# Patient Record
Sex: Female | Born: 1955 | Race: White | Hispanic: No | Smoking: Former smoker
Health system: Southern US, Community
[De-identification: ages and names within clinical notes are randomized; demographics above are authoritative.]

## PROBLEM LIST (undated history)

## (undated) ENCOUNTER — Emergency Department (HOSPITAL_BASED_OUTPATIENT_CLINIC_OR_DEPARTMENT_OTHER): Admission: EM | Payer: 59 | Source: Home / Self Care

## (undated) DIAGNOSIS — J449 Chronic obstructive pulmonary disease, unspecified: Secondary | ICD-10-CM

## (undated) DIAGNOSIS — K219 Gastro-esophageal reflux disease without esophagitis: Secondary | ICD-10-CM

## (undated) DIAGNOSIS — I5181 Takotsubo syndrome: Secondary | ICD-10-CM

## (undated) DIAGNOSIS — I214 Non-ST elevation (NSTEMI) myocardial infarction: Secondary | ICD-10-CM

## (undated) DIAGNOSIS — J189 Pneumonia, unspecified organism: Secondary | ICD-10-CM

## (undated) DIAGNOSIS — F419 Anxiety disorder, unspecified: Secondary | ICD-10-CM

## (undated) HISTORY — DX: Takotsubo syndrome: I51.81

## (undated) HISTORY — PX: CARDIAC CATHETERIZATION: SHX172

## (undated) HISTORY — PX: ABDOMINAL HYSTERECTOMY: SHX81

## (undated) HISTORY — PX: OTHER SURGICAL HISTORY: SHX169

---

## 1998-09-01 ENCOUNTER — Emergency Department (HOSPITAL_COMMUNITY): Admission: EM | Admit: 1998-09-01 | Discharge: 1998-09-01 | Payer: Self-pay

## 1998-09-01 ENCOUNTER — Encounter: Payer: Self-pay | Admitting: Emergency Medicine

## 2001-05-29 ENCOUNTER — Other Ambulatory Visit: Admission: RE | Admit: 2001-05-29 | Discharge: 2001-05-29 | Payer: Self-pay | Admitting: Obstetrics and Gynecology

## 2002-06-16 ENCOUNTER — Emergency Department (HOSPITAL_COMMUNITY): Admission: EM | Admit: 2002-06-16 | Discharge: 2002-06-16 | Payer: Self-pay | Admitting: Emergency Medicine

## 2003-04-16 ENCOUNTER — Emergency Department (HOSPITAL_COMMUNITY): Admission: EM | Admit: 2003-04-16 | Discharge: 2003-04-16 | Payer: Self-pay

## 2004-07-27 ENCOUNTER — Other Ambulatory Visit: Admission: RE | Admit: 2004-07-27 | Discharge: 2004-07-27 | Payer: Self-pay | Admitting: Family Medicine

## 2005-05-26 ENCOUNTER — Ambulatory Visit: Payer: Self-pay | Admitting: Internal Medicine

## 2005-06-01 ENCOUNTER — Ambulatory Visit: Payer: Self-pay | Admitting: *Deleted

## 2005-06-13 ENCOUNTER — Ambulatory Visit: Payer: Self-pay | Admitting: Internal Medicine

## 2005-06-28 ENCOUNTER — Ambulatory Visit: Payer: Self-pay | Admitting: Internal Medicine

## 2005-07-25 ENCOUNTER — Ambulatory Visit: Payer: Self-pay | Admitting: Family Medicine

## 2005-07-31 ENCOUNTER — Ambulatory Visit: Payer: Self-pay | Admitting: Internal Medicine

## 2005-10-02 ENCOUNTER — Ambulatory Visit: Payer: Self-pay | Admitting: Internal Medicine

## 2005-10-11 ENCOUNTER — Ambulatory Visit: Payer: Self-pay | Admitting: Internal Medicine

## 2006-01-15 ENCOUNTER — Ambulatory Visit: Payer: Self-pay | Admitting: Internal Medicine

## 2006-01-16 ENCOUNTER — Encounter (INDEPENDENT_AMBULATORY_CARE_PROVIDER_SITE_OTHER): Payer: Self-pay | Admitting: Family Medicine

## 2006-01-23 ENCOUNTER — Ambulatory Visit (HOSPITAL_COMMUNITY): Admission: RE | Admit: 2006-01-23 | Discharge: 2006-01-23 | Payer: Self-pay | Admitting: Internal Medicine

## 2006-02-05 ENCOUNTER — Ambulatory Visit: Payer: Self-pay | Admitting: Internal Medicine

## 2006-07-19 ENCOUNTER — Ambulatory Visit: Payer: Self-pay | Admitting: Family Medicine

## 2006-07-24 ENCOUNTER — Ambulatory Visit (HOSPITAL_COMMUNITY): Admission: RE | Admit: 2006-07-24 | Discharge: 2006-07-24 | Payer: Self-pay | Admitting: Family Medicine

## 2006-08-10 ENCOUNTER — Ambulatory Visit: Payer: Self-pay | Admitting: Internal Medicine

## 2006-08-12 ENCOUNTER — Inpatient Hospital Stay (HOSPITAL_COMMUNITY): Admission: EM | Admit: 2006-08-12 | Discharge: 2006-08-14 | Payer: Self-pay | Admitting: Emergency Medicine

## 2006-08-21 ENCOUNTER — Ambulatory Visit: Payer: Self-pay | Admitting: Internal Medicine

## 2006-09-07 ENCOUNTER — Ambulatory Visit: Payer: Self-pay | Admitting: Internal Medicine

## 2007-01-24 ENCOUNTER — Ambulatory Visit: Payer: Self-pay | Admitting: Internal Medicine

## 2007-03-12 ENCOUNTER — Emergency Department (HOSPITAL_COMMUNITY): Admission: EM | Admit: 2007-03-12 | Discharge: 2007-03-12 | Payer: Self-pay | Admitting: Emergency Medicine

## 2007-04-09 ENCOUNTER — Ambulatory Visit: Payer: Self-pay | Admitting: Family Medicine

## 2007-05-06 ENCOUNTER — Ambulatory Visit: Payer: Self-pay | Admitting: Family Medicine

## 2007-05-20 ENCOUNTER — Encounter (INDEPENDENT_AMBULATORY_CARE_PROVIDER_SITE_OTHER): Payer: Self-pay | Admitting: Internal Medicine

## 2007-05-20 ENCOUNTER — Ambulatory Visit: Payer: Self-pay | Admitting: Family Medicine

## 2007-05-20 DIAGNOSIS — E538 Deficiency of other specified B group vitamins: Secondary | ICD-10-CM | POA: Insufficient documentation

## 2007-05-20 LAB — CONVERTED CEMR LAB
ALT: 17 units/L (ref 0–35)
AST: 22 units/L (ref 0–37)
Albumin: 4.7 g/dL (ref 3.5–5.2)
Basophils Absolute: 0 10*3/uL (ref 0.0–0.1)
Basophils Relative: 0 % (ref 0–1)
Calcium: 10.1 mg/dL (ref 8.4–10.5)
Chloride: 99 meq/L (ref 96–112)
Creatinine, Ser: 0.84 mg/dL (ref 0.40–1.20)
MCHC: 32.5 g/dL (ref 30.0–36.0)
Neutro Abs: 6.1 10*3/uL (ref 1.7–7.7)
Neutrophils Relative %: 69 % (ref 43–77)
Potassium: 4.2 meq/L (ref 3.5–5.3)
RDW: 13.7 % (ref 11.5–14.0)

## 2007-05-23 ENCOUNTER — Encounter (INDEPENDENT_AMBULATORY_CARE_PROVIDER_SITE_OTHER): Payer: Self-pay | Admitting: Internal Medicine

## 2007-06-18 ENCOUNTER — Encounter (INDEPENDENT_AMBULATORY_CARE_PROVIDER_SITE_OTHER): Payer: Self-pay | Admitting: Family Medicine

## 2007-06-18 DIAGNOSIS — M899 Disorder of bone, unspecified: Secondary | ICD-10-CM | POA: Insufficient documentation

## 2007-06-18 DIAGNOSIS — M949 Disorder of cartilage, unspecified: Secondary | ICD-10-CM

## 2007-06-18 DIAGNOSIS — J441 Chronic obstructive pulmonary disease with (acute) exacerbation: Secondary | ICD-10-CM | POA: Insufficient documentation

## 2007-07-01 DIAGNOSIS — Z9079 Acquired absence of other genital organ(s): Secondary | ICD-10-CM | POA: Insufficient documentation

## 2007-07-17 ENCOUNTER — Encounter (INDEPENDENT_AMBULATORY_CARE_PROVIDER_SITE_OTHER): Payer: Self-pay | Admitting: *Deleted

## 2007-10-09 ENCOUNTER — Ambulatory Visit: Payer: Self-pay | Admitting: Family Medicine

## 2007-10-09 DIAGNOSIS — J069 Acute upper respiratory infection, unspecified: Secondary | ICD-10-CM | POA: Insufficient documentation

## 2008-01-01 ENCOUNTER — Ambulatory Visit: Payer: Self-pay | Admitting: Family Medicine

## 2008-01-01 ENCOUNTER — Encounter (INDEPENDENT_AMBULATORY_CARE_PROVIDER_SITE_OTHER): Payer: Self-pay | Admitting: Family Medicine

## 2008-01-01 LAB — CONVERTED CEMR LAB
Glucose, Urine, Semiquant: NEGATIVE
Protein, U semiquant: NEGATIVE
WBC Urine, dipstick: NEGATIVE

## 2008-01-02 ENCOUNTER — Encounter (INDEPENDENT_AMBULATORY_CARE_PROVIDER_SITE_OTHER): Payer: Self-pay | Admitting: Internal Medicine

## 2008-01-02 LAB — CONVERTED CEMR LAB
Albumin: 4.8 g/dL (ref 3.5–5.2)
Alkaline Phosphatase: 112 units/L (ref 39–117)
BUN: 5 mg/dL — ABNORMAL LOW (ref 6–23)
CO2: 27 meq/L (ref 19–32)
Calcium: 9.4 mg/dL (ref 8.4–10.5)
Cholesterol: 124 mg/dL (ref 0–200)
Glucose, Bld: 81 mg/dL (ref 70–99)
HDL: 54 mg/dL (ref >39)
LDL Cholesterol: 49 mg/dL (ref 0–99)
Potassium: 4.3 meq/L (ref 3.5–5.3)
Triglycerides: 107 mg/dL (ref <150)

## 2008-09-02 ENCOUNTER — Ambulatory Visit: Payer: Self-pay | Admitting: Family Medicine

## 2009-04-06 ENCOUNTER — Ambulatory Visit: Payer: Self-pay | Admitting: Nurse Practitioner

## 2009-04-06 DIAGNOSIS — T148XXA Other injury of unspecified body region, initial encounter: Secondary | ICD-10-CM | POA: Insufficient documentation

## 2009-04-06 DIAGNOSIS — Z87891 Personal history of nicotine dependence: Secondary | ICD-10-CM | POA: Insufficient documentation

## 2009-04-06 LAB — CONVERTED CEMR LAB
AST: 19 units/L (ref 0–37)
Albumin: 4.5 g/dL (ref 3.5–5.2)
Alkaline Phosphatase: 103 units/L (ref 39–117)
HCV Ab: NEGATIVE
HDL: 62 mg/dL (ref >39)
Hep A Total Ab: NEGATIVE
Hep B E Ab: NEGATIVE
LDL Cholesterol: 52 mg/dL (ref 0–99)
Lymphocytes Relative: 30 % (ref 12–46)
Lymphs Abs: 1.8 10*3/uL (ref 0.7–4.0)
Monocytes Relative: 6 % (ref 3–12)
Neutro Abs: 3.7 10*3/uL (ref 1.7–7.7)
Neutrophils Relative %: 61 % (ref 43–77)
Platelets: 256 10*3/uL (ref 150–400)
Potassium: 3.9 meq/L (ref 3.5–5.3)
Prothrombin Time: 12.6 s (ref 11.6–15.2)
RBC: 4.32 M/uL (ref 3.87–5.11)
Sodium: 141 meq/L (ref 135–145)
TSH: 1.1 microintl units/mL (ref 0.350–4.500)
Total Bilirubin: 0.6 mg/dL (ref 0.3–1.2)
Total Protein: 7.1 g/dL (ref 6.0–8.3)
VLDL: 20 mg/dL (ref 0–40)
WBC: 6.1 10*3/uL (ref 4.0–10.5)

## 2009-04-09 ENCOUNTER — Encounter (INDEPENDENT_AMBULATORY_CARE_PROVIDER_SITE_OTHER): Payer: Self-pay | Admitting: Nurse Practitioner

## 2009-04-29 ENCOUNTER — Emergency Department (HOSPITAL_COMMUNITY): Admission: EM | Admit: 2009-04-29 | Discharge: 2009-04-29 | Payer: Self-pay | Admitting: Emergency Medicine

## 2009-06-15 ENCOUNTER — Emergency Department (HOSPITAL_COMMUNITY): Admission: EM | Admit: 2009-06-15 | Discharge: 2009-06-15 | Payer: Self-pay | Admitting: Emergency Medicine

## 2009-06-22 ENCOUNTER — Ambulatory Visit: Payer: Self-pay | Admitting: Nurse Practitioner

## 2009-06-22 DIAGNOSIS — F3289 Other specified depressive episodes: Secondary | ICD-10-CM | POA: Insufficient documentation

## 2009-06-22 DIAGNOSIS — R3129 Other microscopic hematuria: Secondary | ICD-10-CM | POA: Insufficient documentation

## 2009-06-22 DIAGNOSIS — F329 Major depressive disorder, single episode, unspecified: Secondary | ICD-10-CM

## 2009-06-22 LAB — CONVERTED CEMR LAB
Bilirubin Urine: NEGATIVE
Ketones, urine, test strip: NEGATIVE
Nitrite: NEGATIVE
Specific Gravity, Urine: <1.005
WBC Urine, dipstick: NEGATIVE
pH: 6.5

## 2009-06-23 ENCOUNTER — Encounter (INDEPENDENT_AMBULATORY_CARE_PROVIDER_SITE_OTHER): Payer: Self-pay | Admitting: Nurse Practitioner

## 2009-07-12 ENCOUNTER — Encounter (INDEPENDENT_AMBULATORY_CARE_PROVIDER_SITE_OTHER): Payer: Self-pay | Admitting: Nurse Practitioner

## 2009-07-12 ENCOUNTER — Ambulatory Visit: Payer: Self-pay | Admitting: Internal Medicine

## 2009-07-12 LAB — CONVERTED CEMR LAB
Ketones, urine, test strip: NEGATIVE
Nitrite: NEGATIVE
Specific Gravity, Urine: <1.005
Urobilinogen, UA: 0.2
WBC Urine, dipstick: NEGATIVE
pH: 5.5

## 2009-07-13 ENCOUNTER — Encounter (INDEPENDENT_AMBULATORY_CARE_PROVIDER_SITE_OTHER): Payer: Self-pay | Admitting: Nurse Practitioner

## 2009-08-02 ENCOUNTER — Ambulatory Visit: Payer: Self-pay | Admitting: Nurse Practitioner

## 2009-08-02 DIAGNOSIS — R87610 Atypical squamous cells of undetermined significance on cytologic smear of cervix (ASC-US): Secondary | ICD-10-CM | POA: Insufficient documentation

## 2009-08-02 DIAGNOSIS — R87811 Vaginal high risk human papillomavirus (HPV) DNA test positive: Secondary | ICD-10-CM | POA: Insufficient documentation

## 2009-08-04 ENCOUNTER — Encounter (INDEPENDENT_AMBULATORY_CARE_PROVIDER_SITE_OTHER): Payer: Self-pay | Admitting: Nurse Practitioner

## 2009-08-25 ENCOUNTER — Encounter (INDEPENDENT_AMBULATORY_CARE_PROVIDER_SITE_OTHER): Payer: Self-pay | Admitting: Nurse Practitioner

## 2009-08-26 ENCOUNTER — Ambulatory Visit: Payer: Self-pay | Admitting: Obstetrics & Gynecology

## 2009-10-02 ENCOUNTER — Emergency Department (HOSPITAL_COMMUNITY): Admission: EM | Admit: 2009-10-02 | Discharge: 2009-10-02 | Payer: Self-pay | Admitting: Emergency Medicine

## 2009-11-08 ENCOUNTER — Ambulatory Visit: Payer: Self-pay | Admitting: Nurse Practitioner

## 2009-11-08 DIAGNOSIS — L989 Disorder of the skin and subcutaneous tissue, unspecified: Secondary | ICD-10-CM | POA: Insufficient documentation

## 2009-11-20 ENCOUNTER — Emergency Department (HOSPITAL_COMMUNITY): Admission: EM | Admit: 2009-11-20 | Discharge: 2009-11-20 | Payer: Self-pay | Admitting: Emergency Medicine

## 2009-12-02 ENCOUNTER — Ambulatory Visit (HOSPITAL_COMMUNITY): Admission: RE | Admit: 2009-12-02 | Discharge: 2009-12-02 | Payer: Self-pay | Admitting: Internal Medicine

## 2009-12-02 ENCOUNTER — Encounter (INDEPENDENT_AMBULATORY_CARE_PROVIDER_SITE_OTHER): Payer: Self-pay | Admitting: Nurse Practitioner

## 2009-12-14 ENCOUNTER — Ambulatory Visit: Payer: Self-pay | Admitting: Internal Medicine

## 2009-12-24 ENCOUNTER — Encounter (INDEPENDENT_AMBULATORY_CARE_PROVIDER_SITE_OTHER): Payer: Self-pay | Admitting: Nurse Practitioner

## 2010-01-25 ENCOUNTER — Encounter (INDEPENDENT_AMBULATORY_CARE_PROVIDER_SITE_OTHER): Payer: Self-pay | Admitting: Nurse Practitioner

## 2010-01-31 ENCOUNTER — Emergency Department (HOSPITAL_COMMUNITY): Admission: EM | Admit: 2010-01-31 | Discharge: 2010-01-31 | Payer: Self-pay | Admitting: Emergency Medicine

## 2010-04-19 ENCOUNTER — Emergency Department (HOSPITAL_COMMUNITY): Admission: EM | Admit: 2010-04-19 | Discharge: 2010-04-19 | Payer: Self-pay | Admitting: Family Medicine

## 2010-05-26 ENCOUNTER — Ambulatory Visit: Payer: Self-pay | Admitting: Nurse Practitioner

## 2010-05-26 DIAGNOSIS — R209 Unspecified disturbances of skin sensation: Secondary | ICD-10-CM | POA: Insufficient documentation

## 2010-06-05 ENCOUNTER — Emergency Department (HOSPITAL_COMMUNITY): Admission: EM | Admit: 2010-06-05 | Discharge: 2010-06-05 | Payer: Self-pay | Admitting: Family Medicine

## 2010-07-20 ENCOUNTER — Emergency Department (HOSPITAL_COMMUNITY): Admission: EM | Admit: 2010-07-20 | Discharge: 2010-07-20 | Payer: Self-pay | Admitting: Family Medicine

## 2010-08-12 ENCOUNTER — Telehealth (INDEPENDENT_AMBULATORY_CARE_PROVIDER_SITE_OTHER): Payer: Self-pay | Admitting: Nurse Practitioner

## 2010-08-22 ENCOUNTER — Ambulatory Visit: Payer: Self-pay | Admitting: Nurse Practitioner

## 2010-08-26 ENCOUNTER — Encounter (INDEPENDENT_AMBULATORY_CARE_PROVIDER_SITE_OTHER): Payer: Self-pay | Admitting: Nurse Practitioner

## 2010-10-13 ENCOUNTER — Telehealth (INDEPENDENT_AMBULATORY_CARE_PROVIDER_SITE_OTHER): Payer: Self-pay | Admitting: Nurse Practitioner

## 2010-11-20 ENCOUNTER — Encounter: Payer: Self-pay | Admitting: Occupational Therapy

## 2010-11-20 ENCOUNTER — Encounter: Payer: Self-pay | Admitting: *Deleted

## 2010-11-20 ENCOUNTER — Encounter: Payer: Self-pay | Admitting: Family Medicine

## 2010-11-21 ENCOUNTER — Encounter: Payer: Self-pay | Admitting: Internal Medicine

## 2010-11-27 LAB — CONVERTED CEMR LAB
ALT: 11 units/L (ref 0–35)
AST: 18 units/L (ref 0–37)
Albumin: 4.6 g/dL (ref 3.5–5.2)
Alkaline Phosphatase: 102 units/L (ref 39–117)
BUN: 8 mg/dL (ref 6–23)
Basophils Absolute: 0 10*3/uL (ref 0.0–0.1)
Basophils Relative: 0 % (ref 0–1)
CO2: 23 meq/L (ref 19–32)
Calcium: 9.6 mg/dL (ref 8.4–10.5)
Chlamydia, DNA Probe: NEGATIVE
Chloride: 105 meq/L (ref 96–112)
Cholesterol: 138 mg/dL (ref 0–200)
Creatinine, Ser: 0.75 mg/dL (ref 0.40–1.20)
Eosinophils Absolute: 0.5 10*3/uL (ref 0.0–0.7)
Eosinophils Relative: 11 % — ABNORMAL HIGH (ref 0–5)
GC Probe Amp, Genital: NEGATIVE
Glucose, Bld: 100 mg/dL — ABNORMAL HIGH (ref 70–99)
HCT: 43.7 % (ref 36.0–46.0)
HDL: 66 mg/dL (ref >39)
Hemoglobin: 14.4 g/dL (ref 12.0–15.0)
LDL Cholesterol: 54 mg/dL (ref 0–99)
Lymphocytes Relative: 33 % (ref 12–46)
Lymphs Abs: 1.6 10*3/uL (ref 0.7–4.0)
MCHC: 33 g/dL (ref 30.0–36.0)
MCV: 101.6 fL — ABNORMAL HIGH (ref 78.0–100.0)
Monocytes Absolute: 0.3 10*3/uL (ref 0.1–1.0)
Monocytes Relative: 6 % (ref 3–12)
Neutro Abs: 2.4 10*3/uL (ref 1.7–7.7)
Neutrophils Relative %: 50 % (ref 43–77)
Platelets: 260 10*3/uL (ref 150–400)
Potassium: 4.2 meq/L (ref 3.5–5.3)
RBC: 4.3 M/uL (ref 3.87–5.11)
RDW: 12.8 % (ref 11.5–15.5)
Sodium: 141 meq/L (ref 135–145)
TSH: 0.625 microintl units/mL (ref 0.350–4.500)
Total Bilirubin: 0.6 mg/dL (ref 0.3–1.2)
Total CHOL/HDL Ratio: 2.1
Total Protein: 7.2 g/dL (ref 6.0–8.3)
Triglycerides: 89 mg/dL (ref <150)
VLDL: 18 mg/dL (ref 0–40)
WBC: 4.7 10*3/uL (ref 4.0–10.5)

## 2010-12-01 NOTE — Assessment & Plan Note (Signed)
Summary: COPD   Vital Signs:  Patient profile:   55 year old female Menstrual status:  postmenopausal Height:      66 inches Weight:      110 pounds BMI:     17.82 O2 Sat:      94 % on Room air Temp:     98.7 degrees F oral Pulse rate:   86 / minute Pulse rhythm:   regular Resp:     18 per minute BP sitting:   120 / 76  (left arm) Cuff size:   regular  Vitals Entered By: Armenia Shannon (August 22, 2010 12:32 PM)  O2 Flow:  Room air CC: FLU SHOT AND COPD CHECK UP... Is Patient Diabetic? No Pain Assessment Patient in pain? no       Does patient need assistance? Functional Status Self care Ambulation Normal Comments PF  1.     120      2.   110    3.   110   CC:  FLU SHOT AND COPD CHECK UP....  History of Present Illness:  Pt into the office for chronic f/u. she has restarted smoking. She has been in the urgent care twice since her last visit. 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 use the MDI more frequently    Habits & Providers  Alcohol-Tobacco-Diet     Alcohol drinks/day: 1     Alcohol Counseling: to decrease amount and/or frequency of alcohol intake     Alcohol type: beer     Tobacco Status: current     Tobacco Counseling: to quit use of tobacco products     Cigarette Packs/Day: 0.5     Year Started: age 21  Exercise-Depression-Behavior     Does Patient Exercise: yes     Type of exercise: walking     Depression Counseling: not indicated; screening negative for depression     Drug Use: no     Seat Belt Use: 100  Current Medications (verified): 1)  Celexa 20 Mg  Tabs (Citalopram Hydrobromide) .... One By Mouth Once Daily 2)  Oscal 500/200 D-3 500-200 Mg-Unit  Tabs (Calcium-Vitamin D) .... Two Times A Day 3)  Ventolin Hfa 108 (90 Base) Mcg/act Aers (Albuterol Sulfate) .... 2 Puffs Every 6 Hours As Needed For Shortness of Breath 4)  Atrovent Hfa 17 Mcg/act  Aers (Ipratropium Bromide Hfa) .... 2 Puffs Three Times A  Day 5)  Folic Acid 1 Mg  Tabs (Folic Acid) .... One By Mouth Once Daily 6)  Flovent Hfa 44 Mcg/act Aero (Fluticasone Propionate  Hfa) .... 2 Puffs Inhalation Two Times A Day 7)  Bayer Low Strength 81 Mg Tbec (Aspirin) .... One Tablet By Mouth Daily 8)  Mucinex 600 Mg Xr12h-Tab (Guaifenesin) .... One Tablet By Mouth Two Times A Day  Allergies (verified): 1)  ! Codeine  Social History: Smoking Status:  current  Review of Systems General:  Denies fever. CV:  Denies chest pain or discomfort. Resp:  Complains of cough, shortness of breath, and wheezing. GI:  Denies abdominal pain, nausea, and vomiting. Neuro:  Complains of numbness and tingling; intermittent - starts from the wrist and goes down into the hand .  Physical Exam  General:  alert.   Head:  normocephalic.  short hair Lungs:  few wheezes upper but little air movement throughout Heart:  normal rate and regular rhythm.     Impression & Recommendations:  Problem # 1:  COPD (ICD-496) advised pt that she needs to use symbicort two times a day  MDI should be used for rescuse she should try to avoid triggers such as dust or wool most importantly pt should quit smoking The following medications were removed from the medication list:    Flovent Hfa 44 Mcg/act Aero (Fluticasone propionate  hfa) .Marland Kitchen... 2 puffs inhalation two times a day Her updated medication list for this problem includes:    Ventolin Hfa 108 (90 Base) Mcg/act Aers (Albuterol sulfate) .Marland Kitchen... 2 puffs every 6 hours as needed for shortness of breath    Atrovent Hfa 17 Mcg/act Aers (Ipratropium bromide hfa) .Marland Kitchen... 2 puffs three times a day    Symbicort 160-4.5 Mcg/act Aero (Budesonide-formoterol fumarate) ..... One inhalation two times a day  Orders: Pulse Oximetry (single measurment) (94760) Peak Flow Rate (94150) Nebulizer Tx (16109) Atrovent 1mg  (Neb) [phone]) Albuterol Sulfate Sol 1mg  unit dose (U9811)  Problem # 2:  TINGLING (ICD-782.0) left hand - likely  due to work as pain is only in the hand no pain in neck and shoulder  Problem # 3:  TOBACCO ABUSE (ICD-305.1) advised cessation  Problem # 4:  NEED PROPHYLACTIC VACCINATION&INOCULATION FLU (ICD-V04.81) given today  Complete Medication List: 1)  Celexa 20 Mg Tabs (Citalopram hydrobromide) .... One by mouth once daily 2)  Oscal 500/200 D-3 500-200 Mg-unit Tabs (Calcium-vitamin d) .... Two times a day 3)  Ventolin Hfa 108 (90 Base) Mcg/act Aers (Albuterol sulfate) .... 2 puffs every 6 hours as needed for shortness of breath 4)  Atrovent Hfa 17 Mcg/act Aers (Ipratropium bromide hfa) .... 2 puffs three times a day 5)  Folic Acid 1 Mg Tabs (Folic acid) .... One by mouth once daily 6)  Bayer Low Strength 81 Mg Tbec (Aspirin) .... One tablet by mouth daily 7)  Mucinex 600 Mg Xr12h-tab (Guaifenesin) .... One tablet by mouth two times a day 8)  Symbicort 160-4.5 Mcg/act Aero (Budesonide-formoterol fumarate) .... One inhalation two times a day  Other Orders: Flu Vaccine 66yrs + (91478) Admin 1st Vaccine (29562)  Patient Instructions: 1)  You have been given the flu vaccine today. 2)  Try to quit smoking.  Quitting smoking will greatly improve your COPD progression. 3)  Start symbicort - one puff twice per day.  This should be taking twice per day.  You can still use your MDI as as needed  4)  Follow up as needed or sooner if symptoms continue or worsens Prescriptions: SYMBICORT 160-4.5 MCG/ACT AERO (BUDESONIDE-FORMOTEROL FUMARATE) One inhalation two times a day  #2 x 0   Entered and Authorized by:   Lehman Prom FNP   Signed by:   Lehman Prom FNP on 08/22/2010   Method used:   Samples Given   RxID:   [id number]    Medication Administration  Medication # 1:    Medication: Albuterol Sulfate Sol 1mg  unit dose    Diagnosis: COPD (ICD-496)    Dose: 0.5MG     Route: inhaled    Exp Date: 07/2011    Lot #: [id number]    Mfr: NEPHRON    Comments: [id number]    Patient  tolerated medication without complications    Given by: Armenia Shannon (August 22, 2010 1:44 PM)  Medication # 2:    Medication: Atrovent 1mg  (Neb)    Diagnosis: COPD (ICD-496)    Dose: 0.5MG     Route: inhaled    Exp Date: 05/2011    Lot #: [id number]    Mfr: NEPHRON  Comments: [id number]    Patient tolerated medication without complications    Given by: Armenia Shannon (August 22, 2010 1:44 PM)  Orders Added: 1)  Est. Patient Level III [99213] 2)  Pulse Oximetry (single measurment) [94760] 3)  Peak Flow Rate [94150] 4)  Flu Vaccine 51yrs + [90658] 5)  Admin 1st Vaccine [90471] 6)  Nebulizer Tx [11914] 7)  Atrovent 1mg  (Neb) [N8295] 8)  Albuterol Sulfate Sol 1mg  unit dose [A2130]   Immunizations Administered:  Influenza Vaccine # 1:    Vaccine Type: Fluvax 3+    Site: left deltoid    Mfr: GlaxoSmithKline    Dose: 0.5 ml    Route: IM    Given by: Armenia Shannon    Exp. Date: 04/29/2011    Lot #: [id number]    VIS given: 05/24/10 version given August 22, 2010.  Flu Vaccine Consent Questions:    Do you have a history of severe allergic reactions to this vaccine? no    Any prior history of allergic reactions to egg and/or gelatin? no    Do you have a sensitivity to the preservative Thimersol? no    Do you have a past history of Guillan-Barre Syndrome? no    Do you currently have an acute febrile illness? no    Have you ever had a severe reaction to latex? no    Vaccine information given and explained to patient? yes    Are you currently pregnant? no   Immunizations Administered:  Influenza Vaccine # 1:    Vaccine Type: Fluvax 3+    Site: left deltoid    Mfr: GlaxoSmithKline    Dose: 0.5 ml    Route: IM    Given by: Armenia Shannon    Exp. Date: 04/29/2011    Lot #: [id number]    VIS given: 05/24/10 version given August 22, 2010.  Prevention & Chronic Care Immunizations   Influenza vaccine: Fluvax 3+  (08/22/2010)    Tetanus booster: 07/31/2005:  Historical    Pneumococcal vaccine: Historical  (07/02/2006)  Colorectal Screening   Hemoccult: Not documented    Colonoscopy: Not documented  Other Screening   Pap smear:  Specimen Adequacy: Satisfactory for evaluation.   Squamous Cell: Atypical Squamous Cells of undetermined significance (ASC-US).    (07/12/2009)   Pap smear due: 10/2018    Mammogram: No specific mammographic evidence of malignancy.  No significant changes compared to previous study.  Assessment: BIRADS 1.   (12/02/2009)   Mammogram action/deferral: Screening mammogram in 1 year.     (12/02/2009)   Mammogram due: 11/2010   Smoking status: current  (08/22/2010)  Lipids   Total Cholesterol: 138  (07/12/2009)   LDL: 54  (07/12/2009)   LDL Direct: Not documented   HDL: 66  (07/12/2009)   Triglycerides: 89  (07/12/2009)   Nursing Instructions: Give Flu vaccine today       Medication Administration  Medication # 1:    Medication: Albuterol Sulfate Sol 1mg  unit dose    Diagnosis: COPD (ICD-496)    Dose: 0.5MG     Route: inhaled    Exp Date: 07/2011    Lot #: [id number]    Mfr: NEPHRON    Comments: [id number]    Patient tolerated medication without complications    Given by: Armenia Shannon (August 22, 2010 1:44 PM)  Medication # 2:    Medication: Atrovent 1mg  (Neb)    Diagnosis: COPD (ICD-496)    Dose: 0.5MG     Route: inhaled    Exp  Date: 05/2011    Lot #: [id number]    Mfr: NEPHRON    Comments: [phone]    Patient tolerated medication without complications    Given by: Armenia Shannon (August 22, 2010 1:44 PM)  Orders Added: 1)  Est. Patient Level III [99213] 2)  Pulse Oximetry (single measurment) [94760] 3)  Peak Flow Rate [94150] 4)  Flu Vaccine 59yrs + [90658] 5)  Admin 1st Vaccine [90471] 6)  Nebulizer Tx [94640] 7)  Atrovent 1mg  (Neb) [W2956] 8)  Albuterol Sulfate Sol 1mg  unit dose [O1308]

## 2010-12-01 NOTE — Miscellaneous (Signed)
Summary: Mammogram results   Clinical Lists Changes  Observations: Added new observation of MAMMO DUE: 11/2010 (01/25/2010 15:11) Added new observation of MAMMRECACT: Screening mammogram in 1 year.    (12/02/2009 15:11) Added new observation of MAMMOGRAM: No specific mammographic evidence of malignancy.  No significant changes compared to previous study.  Assessment: BIRADS 1.  (12/02/2009 15:11)      Mammogram  Procedure date:  12/02/2009  Findings:      No specific mammographic evidence of malignancy.  No significant changes compared to previous study.  Assessment: BIRADS 1.   Comments:      Screening mammogram in 1 year.     Procedures Next Due Date:    Mammogram: 11/2010   Mammogram  Procedure date:  12/02/2009  Findings:      No specific mammographic evidence of malignancy.  No significant changes compared to previous study.  Assessment: BIRADS 1.   Comments:      Screening mammogram in 1 year.     Procedures Next Due Date:    Mammogram: 11/2010

## 2010-12-01 NOTE — Assessment & Plan Note (Signed)
Summary: COPD   Vital Signs:  Patient profile:   55 year old female Menstrual status:  postmenopausal Weight:      111.4 pounds BMI:     18.05 BSA:     1.56 O2 Sat:      100 % Temp:     97.1 degrees F oral Pulse rate:   83 / minute Pulse rhythm:   regular Resp:     20 per minute BP sitting:   130 / 84  (left arm) Cuff size:   regular  Vitals Entered By: Levon Hedger (November 08, 2009 3:34 PM) CC: follow-up visit COPD...spots on face that has been there for about a month, Depression Is Patient Diabetic? No Pain Assessment Patient in pain? no       Does patient need assistance? Functional Status Self care Ambulation Normal   CC:  follow-up visit COPD...spots on face that has been there for about a month and Depression.  History of Present Illness:  Pt into the office for f/u on COPD.  She has been to the ER about 4 weeks ago.  C/o trouble breathing at that time.  She stopped taking the advair at that time.  She was given a neb, CXRAY.  Pt did NOT restart the advair. She is taking atrovent - 1puff in the morning and 1 puff at night.  Social - pt has moved x 1 week ago.  She is now in a pet free environment.   Feeling much better about herself and current situation  GYN referral - pt did go to GYN as ordered.  Since she is s/p hysterectomy she will NOT need any more PAP Smears.  Mammogram - pt was ordered a mammogram but she did not go to her appt.  Will reschedule today  Depression History:      The patient denies fatigue (loss of energy).        The patient denies that she feels like life is not worth living, denies that she wishes that she were dead, and denies that she has thought about ending her life.         Depression Treatment History:  Prior Medication Used:   Start Date: Assessment of Effect:   Comments:  Celexa (citalopram)     --     much improvement     still taking tylenol PM nightly for sleep   Allergies (verified): 1)  ! Codeine  Review of  Systems CV:  Denies chest pain or discomfort. Resp:  Denies cough. GI:  Denies constipation. Neuro:  Denies headaches. Psych:  Denies anxiety and depression.  Physical Exam  General:  alert.   Head:  normocephalic.   Lungs:  Good air movement throughout no wheezes or rhonchi Heart:  normal rate and regular rhythm.   Msk:  up to the exam table increased AP diameter Neurologic:  alert & oriented X3.   Skin:  right cheek - .5mm x .5mm, discolored raised lesion Psych:  Oriented X3.     Impression & Recommendations:  Problem # 1:  COPD (ICD-496) stable pt has previous environment which was pet filled.  breathing is better The following medications were removed from the medication list:    Advair Diskus 250-50 Mcg/dose Aepb (Fluticasone-salmeterol) ..... One inhalation two times a day for breathing **stop q-var** **pharmacy - instruct pt on how to use Her updated medication list for this problem includes:    Ventolin Hfa 108 (90 Base) Mcg/act Aers (Albuterol sulfate) .Marland Kitchen... 2 puffs every  6 hours as needed for shortness of breath    Atrovent Hfa 17 Mcg/act Aers (Ipratropium bromide hfa) .Marland Kitchen... 2 puffs three times a day  Orders: Pulse Oximetry (single measurment) (94760)  Problem # 2:  UNSPECIFIED BREAST SCREENING (ICD-V76.10) will reschedule mammogram  Problem # 3:  ASCUS PAP (ICD-795.01) GYN referral done pt more paps  Problem # 4:  SKIN LESION (ICD-709.9) will refer to derm at [facility] street  Orders: Dermatology Referral (Derma)  Problem # 5:  DEPRESSION, CHRONIC (ICD-311) tolerating meds well. pt has a great attitude today and is doing well Her updated medication list for this problem includes:    Celexa 20 Mg Tabs (Citalopram hydrobromide) ..... One by mouth once daily  Complete Medication List: 1)  Celexa 20 Mg Tabs (Citalopram hydrobromide) .... One by mouth once daily 2)  Oscal 500/200 D-3 500-200 Mg-unit Tabs (Calcium-vitamin d) .... Two times a day 3)  Ventolin  Hfa 108 (90 Base) Mcg/act Aers (Albuterol sulfate) .... 2 puffs every 6 hours as needed for shortness of breath 4)  Atrovent Hfa 17 Mcg/act Aers (Ipratropium bromide hfa) .... 2 puffs three times a day 5)  Folic Acid 1 Mg Tabs (Folic acid) .... One by mouth once daily 6)  Chantix Starting Month Pak 0.5 Mg X 11 & 1 Mg X 42 Tabs (Varenicline tartrate) .... Dispense started pack  Patient Instructions: 1)  Keep your appointment for mammogram 2)  Keep up the good work with breathing.  You look great. 3)  You will be scheduled an appointment with dermatology at [facility] street.  This is not an emergent referral so whenever they have an appointment you can be scheduled. 4)  Follow up in 6 months or sooner if necessary Prescriptions: VENTOLIN HFA 108 (90 BASE) MCG/ACT AERS (ALBUTEROL SULFATE) 2 puffs every 6 hours as needed for shortness of breath  #1 x 3   Entered and Authorized by:   Lehman Prom FNP   Signed by:   Lehman Prom FNP on 11/08/2009   Method used:   Faxed to ...       [facility] - [facility] (retail)       [address] [place], [place]  [address]       Ph: [phone]       Fax: [phone]   RxID:   [id number]    Procedures Next Due Date:    Pap Smear: 10/2018

## 2010-12-01 NOTE — Letter (Signed)
Summary: [facility] HOSPITAL CLINIC NOTE  [facility] CLINIC NOTE   Imported By: Arta Bruce 09/13/2010 15:19:19  _____________________________________________________________________  External Attachment:    Type:   Image     Comment:   External Document

## 2010-12-01 NOTE — [facility] Notes (Signed)
Summary: [facility]  2.15.11  [facility] Clinic  2.15.11   Imported By: Percell Miller 12/24/2009 14:32:54  _____________________________________________________________________  External Attachment:    Type:   Image     Comment:   External Document

## 2010-12-01 NOTE — Letter (Signed)
Summary: TEST ORDER FORM//MAMMOGRAM//APPT DATE & TIME  TEST ORDER FORM//MAMMOGRAM//APPT DATE & TIME   Imported By: Arta Bruce 12/21/2009 11:55:07  _____________________________________________________________________  External Attachment:    Type:   Image     Comment:   External Document

## 2010-12-01 NOTE — Letter (Signed)
Summary: Handout Printed  Printed Handout:  - Peripheral Vascular Disease 

## 2010-12-01 NOTE — [facility] Notes (Signed)
Summary: REFILL ON HER INHALERS  Phone Note Call from Patient Call back at Home Phone [phone]   Reason for Call: Refill Medication Summary of Call: Heidi [name] CALLED FOR A REFILL ON HER VENTOLIN, ATROVENT AND FLOVENT.  SHE USES [facility]. Initial call taken by: Leodis Rains,  August 12, 2010 4:03 PM  Follow-up for Phone Call        Rx refilled, pt. notified.  Dutch Quint RN  August 12, 2010 4:12 PM     Prescriptions: FLOVENT HFA 44 MCG/ACT AERO (FLUTICASONE PROPIONATE  HFA) 2 puffs inhalation two times a day  #1 x 0   Entered by:   Dutch Quint RN   Authorized by:   Lehman Prom FNP   Signed by:   Dutch Quint RN on 08/12/2010   Method used:   Faxed to ...       [facility] - [facility] (retail)       [address] [place], [place]  [address]       Ph: [phone]       Fax: [phone]   RxID:   [id number] ATROVENT HFA 17 MCG/ACT  AERS (IPRATROPIUM BROMIDE HFA) 2 puffs three times a day  #1 x 6   Entered by:   Dutch Quint RN   Authorized by:   Lehman Prom FNP   Signed by:   Dutch Quint RN on 08/12/2010   Method used:   Faxed to ...       [facility] - [facility] (retail)       [address] [place], [place]  [address]       Ph: [phone]       Fax: [phone]   RxID:   [id number] VENTOLIN HFA 108 (90 BASE) MCG/ACT AERS (ALBUTEROL SULFATE) 2 puffs every 6 hours as needed for shortness of breath  #1 x 3   Entered by:   Dutch Quint RN   Authorized by:   Lehman Prom FNP   Signed by:   Dutch Quint RN on 08/12/2010   Method used:   Faxed to ...       [facility] - [facility] (retail)       [address] [place], [place]  [address]       Ph: [phone]       Fax: [phone]   RxID:   [id number]

## 2010-12-01 NOTE — [facility] Notes (Signed)
Summary: Refill Symbicort  Phone Note Refill Request   Refills Requested: Medication #1:  SYMBICORT 160-4.5 MCG/ACT AERO One inhalation two times a day. [facility]  Initial call taken by: Domenic Polite,  October 13, 2010 12:33 PM  Follow-up for Phone Call        Refill completed.  Dutch Quint RN  October 13, 2010 12:47 PM     Prescriptions: SYMBICORT 160-4.5 MCG/ACT AERO (BUDESONIDE-FORMOTEROL FUMARATE) One inhalation two times a day  #2 x 0   Entered by:   Dutch Quint RN   Authorized by:   Lehman Prom FNP   Signed by:   Dutch Quint RN on 10/13/2010   Method used:   Faxed to ...       [facility] - [facility] (retail)       [address] [place], [place]  [address]       Ph: [phone]       Fax: [phone]   RxID:   [id number]

## 2010-12-01 NOTE — Letter (Signed)
Summary: Handout Printed  Printed Handout:  - Chronic Obstructive Pulmonary Disease (COPD) 

## 2010-12-01 NOTE — Letter (Signed)
Summary: Handout Printed  Printed Handout:  - Chronic Obstructive Pulmonary Disease (COPD), Easy-to-Read 

## 2010-12-01 NOTE — Assessment & Plan Note (Signed)
Summary: COPD   Vital Signs:  Patient profile:   55 year old female Menstrual status:  postmenopausal Weight:      109.8 pounds O2 Sat:      99 % on Room air Temp:     98.0 degrees F Pulse rate:   76 / minute Pulse rhythm:   regular Resp:     20 per minute BP sitting:   108 / 75  (left arm) Cuff size:   regular  Vitals Entered By: Levon Hedger (May 26, 2010 11:44 AM)  O2 Flow:  Room air CC: had a cold 2 months ago and went to urgent care and she still has congestion x 1 month she is coughing up pinkish Heidi [name] phlem...tingling in fingertips and feet feel like they are burning Is Patient Diabetic? No Pain Assessment Patient in pain? no       Does patient need assistance? Functional Status Self care Ambulation Normal   CC:  had a cold 2 months ago and went to urgent care and she still has congestion x 1 month she is coughing up pinkish Heidi [name] phlem...tingling in fingertips and feet feel like they are burning.  History of Present Illness:  Pt into the office with continued cough and congestion. Pt went to the urgent care about 2 months ago. She reports that prior to that she went swimming and the water was not warmed up yet. She was dx with nasopharngitis.  Breathing treatment given but no new meds. Congestion has persisted Cough is worse more when she wakes in the morning tobacco use continues - pt does recognize the need to quit smoking. She is smoking 1/2 ppd Pt reports she is still unable to tolerate the Advair.  reports it makes her breathing worse. She uses atrovent inhaler two times a day and the albuterol as needed   Habits & Providers  Alcohol-Tobacco-Diet     Alcohol drinks/day: 1     Alcohol Counseling: to decrease amount and/or frequency of alcohol intake     Alcohol type: beer     Tobacco Status: quit     Tobacco Counseling: to remain off tobacco products     Cigarette Packs/Day: 0.5     Year Started: age 56  Exercise-Depression-Behavior     Does  Patient Exercise: yes     Type of exercise: walking     Depression Counseling: not indicated; screening negative for depression     Drug Use: no     Seat Belt Use: 100  Allergies (verified): 1)  ! Codeine  Social History: Packs/Day:  0.5  Review of Systems CV:  Denies chest pain or discomfort. Resp:  Complains of cough. GI:  Denies abdominal pain, nausea, and vomiting. Neuro:  Complains of numbness; "buring feeling in bilateral feet - painful" left 3rd, 4th and 5th fingers numb.  no painin neck, shoulder or wrist Pt is right hand dominant.  Physical Exam  General:  alert.  thin Head:  normocephalic.   Lungs:  decreased bases - no wheezes or rhonchi decreased AP diameter Heart:  normal rate and regular rhythm.   Msk:  up to the exam table Neurologic:  alert & oriented X3.   Skin:  color normal.   Psych:  Oriented X3.     Impression & Recommendations:  Problem # 1:  COPD (ICD-496) advised pt to drink plenty of fluids start mucinex handout given The following medications were removed from the medication list:    Advair Diskus 250-50 Mcg/dose Aepb (Fluticasone-salmeterol) .Marland KitchenMarland KitchenMarland KitchenMarland Kitchen  One puff twice daily for shortness of breath Her updated medication list for this problem includes:    Ventolin Hfa 108 (90 Base) Mcg/act Aers (Albuterol sulfate) .Marland Kitchen... 2 puffs every 6 hours as needed for shortness of breath    Atrovent Hfa 17 Mcg/act Aers (Ipratropium bromide hfa) .Marland Kitchen... 2 puffs three times a day    Flovent Hfa 44 Mcg/act Aero (Fluticasone propionate  hfa) .Marland Kitchen... 2 puffs inhalation two times a day  Orders: Pulse Oximetry (single measurment) (13086)  Problem # 2:  TINGLING (ICD-782.0) Hands - advised pt that symptoms may be from repeative activity at work over the past 2 days  she will monitor Feet - ? PVD as pt has a hx of tobacco abuse and ETOH use  Problem # 3:  TOBACCO ABUSE (ICD-305.1) advised pt to quit smoking The following medications were removed from the medication  list:    Chantix Starting Month Pak 0.5 Mg X 11 & 1 Mg X 42 Tabs (Varenicline tartrate) .Marland Kitchen... Dispense started pack  Complete Medication List: 1)  Celexa 20 Mg Tabs (Citalopram hydrobromide) .... One by mouth once daily 2)  Oscal 500/200 D-3 500-200 Mg-unit Tabs (Calcium-vitamin d) .... Two times a day 3)  Ventolin Hfa 108 (90 Base) Mcg/act Aers (Albuterol sulfate) .... 2 puffs every 6 hours as needed for shortness of breath 4)  Atrovent Hfa 17 Mcg/act Aers (Ipratropium bromide hfa) .... 2 puffs three times a day 5)  Folic Acid 1 Mg Tabs (Folic acid) .... One by mouth once daily 6)  Flovent Hfa 44 Mcg/act Aero (Fluticasone propionate  hfa) .... 2 puffs inhalation two times a day 7)  Bayer Low Strength 81 Mg Tbec (Aspirin) .... One tablet by mouth daily 8)  Mucinex 600 Mg Xr12h-tab (Guaifenesin) .... One tablet by mouth two times a day  Patient Instructions: 1)  Numbness in left fingers - may have come from overuse at work.  Monitor over the next week to see if it improves 2)  Feet tingling - may be from poor circulation 3)  Risk factors - tobacco use, alcohol use 4)  Start a baby aspirin 81mg  by mouth daily 5)  COPD - most likely cause of chronic cough 6)  You need to drink plenty of water to thin secretions. 7)  Take Mucinex (NOT DM) one tablet by mouth two times a day (you can get this over the counter) 8)  Try the Flovent - 2 puffs inhalation two times a day (sample) 9)  Follow up as needed Prescriptions: FLOVENT HFA 44 MCG/ACT AERO (FLUTICASONE PROPIONATE  HFA) 2 puffs inhalation two times a day  #1 x 0   Entered and Authorized by:   Lehman Prom FNP   Signed by:   Lehman Prom FNP on 05/26/2010   Method used:   Samples Given   RxID:   [id number]

## 2011-01-31 LAB — POCT CARDIAC MARKERS
CKMB, poc: 3.7 ng/mL (ref 1.0–8.0)
Myoglobin, poc: 108 ng/mL (ref 12–200)
Troponin i, poc: <0.05 ng/mL (ref 0.00–0.09)

## 2011-01-31 LAB — BASIC METABOLIC PANEL
CO2: 27 mEq/L (ref 19–32)
Chloride: 103 mEq/L (ref 96–112)
Potassium: 4.4 mEq/L (ref 3.5–5.1)

## 2011-01-31 LAB — URINE MICROSCOPIC-ADD ON

## 2011-01-31 LAB — URINALYSIS, ROUTINE W REFLEX MICROSCOPIC
Nitrite: NEGATIVE
Protein, ur: NEGATIVE mg/dL
Specific Gravity, Urine: 1.009 (ref 1.005–1.030)
Urobilinogen, UA: 0.2 mg/dL (ref 0.0–1.0)

## 2011-01-31 LAB — DIFFERENTIAL
Basophils Absolute: 0.1 10*3/uL (ref 0.0–0.1)
Basophils Relative: 1 % (ref 0–1)
Eosinophils Absolute: 0.5 10*3/uL (ref 0.0–0.7)
Lymphocytes Relative: 28 % (ref 12–46)
Lymphs Abs: 2.5 10*3/uL (ref 0.7–4.0)
Monocytes Relative: 7 % (ref 3–12)

## 2011-01-31 LAB — CBC
HCT: 44.3 % (ref 36.0–46.0)
Hemoglobin: 15.3 g/dL — ABNORMAL HIGH (ref 12.0–15.0)
MCV: 102.6 fL — ABNORMAL HIGH (ref 78.0–100.0)
WBC: 8.8 10*3/uL (ref 4.0–10.5)

## 2011-03-17 NOTE — H&P (Signed)
NAME:  Heidi [name], Heidi [name]              ACCOUNT NO.:  [id number]   MEDICAL RECORD NO.:  [id number]          PATIENT TYPE:  OBV   LOCATION:  0102                         FACILITY:  [facility]   PHYSICIAN:  Mobolaji B. Bakare, M.D.DATE OF BIRTH:  12/21/1955   DATE OF ADMISSION:  08/10/2006  DATE OF DISCHARGE:                                HISTORY & PHYSICAL   PRIMARY CARE PHYSICIAN:  Unassigned.   CHIEF COMPLAINT:  Shortness of breath for 2 days.   HISTORY OF PRESENTING COMPLAINT:  Heidi [name] is a 55 year old Caucasian  female who was recently diagnosed with COPD approximately 2-3 weeks ago.  She has been on Proventil and Spiriva inhaler.  She has been experiencing  cough productive of whitish sputum and difficulty with breathing on  exertion.  In the last 2-3 days, the patient's symptoms have worsened.  She  could hear herself wheezing, and she has been coughing more.  She went to a  physician's office yesterday. She was given prescription for prednisone and  antibiotics. She did not get this filled.  She, unfortunately because of  symptoms, got worse, and she needed to come to the emergency room.  She had  been nebulized with Xopenex and Atrovent.  She has received 125 mg Solu-  Medrol.  The patient is presently feeling better.  She had a chest x-ray  which showed COPD but no consolidation or infiltrates.   REVIEW OF SYSTEMS:  She denies fever, chills.  No chest pain, constipation,  diarrhea.  There are no urinary symptoms.   PAST MEDICAL HISTORY:  1. COPD.  The patient had pulmonary function tests at [facility] sometime      in April or May 2007.  2. Anxiety.   PAST SURGICAL HISTORY:  Partial hysterectomy.   MEDICATIONS:  1. Proventil.  2. Spiriva.  3. Xanax 0.5 mg p.o. nightly p.r.n. anxiety.   ALLERGIES:  CODEINE gives nausea and vomiting.   FAMILY HISTORY:  Father is alive.  He has hypertension.  Mother positive for  MI at the age of 91.  She has significant family history  of diabetes  mellitus in both grandparents and aunt.   SOCIAL HISTORY:  The patient has significant alcohol abuse.  She has about  25-30 pack years of smoking, average of 1 pack per day.  She drinks 1-2 cans  of beer a day.  She does not get withdrawal symptoms.  The patient has her  own restaurant business.   PHYSICAL EXAMINATION:  VITAL SIGNS: Temperature 98.4, blood pressure 153/94,  pulse 115, respiratory rate 24, O2 saturation 95% while seated.  GENERAL:  On examination, the patient appears comfortable, not wheezing, not  dyspneic.  HEENT:  Normocephalic and atraumatic.  Pupils equal, round, and reactive to  light.  Mucous membranes moist.  No oral thrush.  NECK:  No JVD.  LUNGS:  Reduced air entry bilaterally and rhonchi.  CARDIOVASCULAR:  S1, S2 regular.  ABDOMEN:  Nondistended, soft, nontender.  EXTREMITIES:  No pedal edema, no calf tenderness.  Dorsalis pedis pulses  palpable bilaterally.  CNS: No focal neurological deficits.   LABORATORY  DATA:  ABG on 2 liters of oxygen, pH 7.36, pCO2 45, pO2 68,  bicarb 25, oxygen saturation 92.  Sodium 137, potassium 3.6, chloride 102,  CO2 25, glucose 130, BUN 1, creatinine 0.7, calcium 9.6.  White cells 5.2,  hemoglobin 16.3, hematocrit 45.8, MCV 100.8, platelets 257.   Chest x-ray showed evidence of COPD, no consolidation or infiltrates.   ASSESSMENT AND PLAN:  1. Chronic obstructive pulmonary disease exacerbation.  We need to admit      to medical-surgical floor for 23-hour observation.  Xopenex 0.63 mg q.      6 h acutely p.r.n., Atrovent 0.5 q. 6 h acutely p.r.n., Solu-Medrol 40      mg IV q. 6 h., Z-Pak.  2. Tobacco abuse.  The patient is willing to quit smoking.  Will give      nicotine patch 24 mg daily as well as smoking cessation counseling.  3. Macrocytosis.  Will check vitamin B12 and folate.      Mobolaji B. Corky Downs, M.D.  Electronically Signed     MBB/MEDQ  D:  08/11/2006  T:  08/11/2006  Job:  [id number]

## 2011-03-17 NOTE — Discharge Summary (Signed)
[name], [name]              ACCOUNT NO.:  [id number]   MEDICAL RECORD NO.:  [id number]          PATIENT TYPE:  INP   LOCATION:  1519                         FACILITY:  [facility]   PHYSICIAN:  Lonia Blood, M.D.       DATE OF BIRTH:  [birth year]   DATE OF ADMISSION:  08/10/2006  DATE OF DISCHARGE:  08/14/2006                                 DISCHARGE SUMMARY   PRIMARY CARE PHYSICIAN:  [facility].   DISCHARGE DIAGNOSES:  1. Chronic obstructive pulmonary disease exacerbation.  2. Tobacco abuse.  3. Anxiety   DISCHARGE MEDICATIONS:  1. Prednisone taper.  2. Doxycycline 100 mg by mouth twice a day for 1 week.  3. Spiriva 1 capsule daily.  4. Xanax 0.5 mg every 8 hours as needed for anxiety.  5. Proventil 4 times a day as needed.  6. Nicotine patch 21 mg daily.   CONDITION ON DISCHARGE:  [name] was discharged home.  She will follow  up at [facility].  At the time of discharge, the patient was  strongly urged not to smoke any cigarettes.   PROCEDURE DURING THIS ADMISSION:  On August 11, 2006, patient underwent a  portable chest x-ray that showed no acute findings and COPD changes.   HISTORY AND PHYSICAL:  For admission history and physical, refer to the  dictated H&P done by Dr. Corky Downs August 10, 2006.   HOSPITAL COURSE:  COPD exacerbation:  [name] was admitted on the night  of August 10, 2006, with complaints of sudden-onset shortness of breath,  coughing and a feeling of impending doom.  The patient was evaluated in the  emergency room, and she was found to be wheezing profoundly and gasping for  air.  She received multiple nebulizer treatments, and her situation improved  by the morning of August 11, 2006.  The patient remained still tight and  required high doses of intravenous Solu-Medrol.  By October 14 and 15, 2007,  the patient's situation has improved dramatically.  She was switched to oral  prednisone and observed for 24 more hours.  By  August 14, 2006, the patient  was ready for discharge after she had a good night's sleep and no recurrent  dyspnea events.  She was able, also, to ambulate to the bathroom without  significant shortness of breath.  In retrospect, it is clear that Mrs.  Angert has suffered a COPD exacerbation, most likely infectious in nature.  She completed a Z-Pak while in the hospital.  The patient will finish up the  treatment with antibiotics for 5 days at home at the time of  discharge.  Also, the patient was discharged on a prednisone taper and  Spiriva on a daily basis.  The patient was strongly encouraged not to smoke  cigarettes, and she was provided with tobacco cessation counseling, smoking  patch and a quit line.      Lonia Blood, M.D.  Electronically Signed     SL/MEDQ  D:  08/16/2006  T:  08/17/2006  Job:  [id number]   cc:   Dala Dock

## 2011-05-26 ENCOUNTER — Inpatient Hospital Stay (INDEPENDENT_AMBULATORY_CARE_PROVIDER_SITE_OTHER)
Admission: RE | Admit: 2011-05-26 | Discharge: 2011-05-26 | Disposition: A | Discharge status: Home / Self Care | Payer: Self-pay | Source: Ambulatory Visit | Attending: Family Medicine | Admitting: Family Medicine

## 2011-05-26 ENCOUNTER — Ambulatory Visit (INDEPENDENT_AMBULATORY_CARE_PROVIDER_SITE_OTHER): Payer: Self-pay

## 2011-05-26 DIAGNOSIS — J40 Bronchitis, not specified as acute or chronic: Secondary | ICD-10-CM

## 2011-09-02 ENCOUNTER — Inpatient Hospital Stay (INDEPENDENT_AMBULATORY_CARE_PROVIDER_SITE_OTHER)
Admission: RE | Admit: 2011-09-02 | Discharge: 2011-09-02 | Disposition: A | Discharge status: Home / Self Care | Payer: Self-pay | Source: Ambulatory Visit | Attending: Family Medicine | Admitting: Family Medicine

## 2011-09-02 DIAGNOSIS — J449 Chronic obstructive pulmonary disease, unspecified: Secondary | ICD-10-CM

## 2011-10-16 ENCOUNTER — Emergency Department (INDEPENDENT_AMBULATORY_CARE_PROVIDER_SITE_OTHER)
Admission: EM | Admit: 2011-10-16 | Discharge: 2011-10-16 | Disposition: A | Discharge status: Home / Self Care | Payer: Self-pay | Source: Home / Self Care

## 2011-10-16 DIAGNOSIS — J42 Unspecified chronic bronchitis: Secondary | ICD-10-CM

## 2011-10-16 HISTORY — DX: Chronic obstructive pulmonary disease, unspecified: J44.9

## 2011-10-16 MED ORDER — ALBUTEROL SULFATE (5 MG/ML) 0.5% IN NEBU
INHALATION_SOLUTION | RESPIRATORY_TRACT | Status: AC
  Filled 2011-10-16: qty 1

## 2011-10-16 MED ORDER — ALBUTEROL SULFATE (2.5 MG/3ML) 0.083% IN NEBU: 2.5000 mg | INHALATION_SOLUTION | RESPIRATORY_TRACT | Status: DC | PRN

## 2011-10-16 MED ORDER — PREDNISONE 20 MG PO TABS: 20.0000 mg | ORAL_TABLET | Freq: Two times a day (BID) | ORAL | Status: AC

## 2011-10-16 MED ORDER — ALBUTEROL SULFATE (5 MG/ML) 0.5% IN NEBU
5.0000 mg | INHALATION_SOLUTION | Freq: Once | RESPIRATORY_TRACT | Status: AC
  Administered 2011-10-16: 5 mg via RESPIRATORY_TRACT

## 2011-10-16 MED ORDER — IPRATROPIUM BROMIDE 0.02 % IN SOLN
0.5000 mg | Freq: Once | RESPIRATORY_TRACT | Status: AC
  Administered 2011-10-16: 0.5 mg via RESPIRATORY_TRACT

## 2011-10-16 NOTE — ED Provider Notes (Signed)
History     CSN: [id number] Arrival date & time: 10/16/2011  3:09 PM   None     Chief Complaint  Patient presents with  . Cough    (Consider location/radiation/quality/duration/timing/severity/associated sxs/prior treatment) HPI Comments: Pt c/o cough x 1 1/2 weeks. Exertional dyspnea and worsens cough. When coughs also becomes short of breath. Also has wheezing. Albuterol inhaler helps, "but I'm using it more than normal."  Cough is productive with white phlegm.   Patient is a 55 y.o. female presenting with cough. The history is provided by the patient.  Cough This is a recurrent problem. The current episode started more than 1 week ago. The problem occurs every few minutes. The problem has not changed since onset.The cough is productive of sputum. There has been no fever. Associated symptoms include shortness of breath and wheezing. Pertinent negatives include no chest pain, no chills, no ear pain, no rhinorrhea and no sore throat. She is a smoker. Her past medical history is significant for bronchitis and COPD.    Past Medical History  Diagnosis Date  . COPD (chronic obstructive pulmonary disease)   . Heart murmur     Past Surgical History  Procedure Date  . Abdominal hysterectomy     History reviewed. No pertinent family history.  History  Substance Use Topics  . Smoking status: Current Everyday Smoker  . Smokeless tobacco: Not on file  . Alcohol Use: Yes    OB History    Grav Para Term Preterm Abortions TAB SAB Ect Mult Living                  Review of Systems  Constitutional: Negative for fever and chills.  HENT: Negative for ear pain, sore throat and rhinorrhea.   Respiratory: Positive for cough, shortness of breath and wheezing.   Cardiovascular: Negative for chest pain and palpitations.    Allergies  Codeine  Home Medications   Current Outpatient Rx  Name Route Sig Dispense Refill  . ALBUTEROL SULFATE HFA 108 (90 BASE) MCG/ACT IN AERS Inhalation  Inhale 2 puffs into the lungs every 6 (six) hours as needed.      . BUDESONIDE-FORMOTEROL FUMARATE 160-4.5 MCG/ACT IN AERO Inhalation Inhale 2 puffs into the lungs 2 (two) times daily.      Marland Kitchen CITALOPRAM HYDROBROMIDE 40 MG PO TABS Oral Take 40 mg by mouth daily.      . IPRATROPIUM BROMIDE HFA 17 MCG/ACT IN AERS Inhalation Inhale 2 puffs into the lungs every 6 (six) hours.      . ALBUTEROL SULFATE (2.5 MG/3ML) 0.083% IN NEBU Nebulization Take 3 mLs (2.5 mg total) by nebulization every 4 (four) hours as needed for wheezing or shortness of breath. 75 mL 0  . PREDNISONE 20 MG PO TABS Oral Take 1 tablet (20 mg total) by mouth 2 (two) times daily. 10 tablet 0    BP 158/91  Pulse 86  Temp(Src) 98.4 F (36.9 C) (Oral)  Resp 24  SpO2 95%  Physical Exam  Nursing note and vitals reviewed. Constitutional: She appears well-developed and well-nourished. No distress.  HENT:  Head: Normocephalic and atraumatic.  Right Ear: Tympanic membrane, external ear and ear canal normal.  Left Ear: Tympanic membrane, external ear and ear canal normal.  Nose: Nose normal.  Mouth/Throat: Uvula is midline, oropharynx is clear and moist and mucous membranes are normal. No oropharyngeal exudate, posterior oropharyngeal edema or posterior oropharyngeal erythema.  Neck: Neck supple.  Cardiovascular: Normal rate, regular rhythm and normal heart  sounds.   Pulmonary/Chest: Effort normal. No respiratory distress. She has decreased breath sounds. She has wheezes. She has rhonchi. She has no rales.       Bilat decreased BS, with scattered exp wheeze, and mild rhonci. No rales.  Lymphadenopathy:    She has no cervical adenopathy.  Neurological: She is alert.  Skin: Skin is warm and dry.  Psychiatric: She has a normal mood and affect.    ED Course  Procedures (including critical care time)  Labs Reviewed - No data to display No results found.   1. Chronic bronchitis       MDM   Pt reports symptomatic  improvement after NMT. Lungs- decreased BS continue bilat, but no wheeze or rhonci. Pulse ox 97% RA.      Melody Comas, [place] 10/16/11 [phone]

## 2011-10-16 NOTE — ED Provider Notes (Signed)
Medical screening examination/treatment/procedure(s) were performed by non-physician practitioner and as supervising physician I was immediately available for consultation/collaboration.  Hillery Hunter, MD 10/16/11 [phone]

## 2011-10-16 NOTE — ED Notes (Signed)
States she has been having problems w cough for past couple of days, not relieved w her usual MDI; history of COPD, wheezing audible bilateral on ascultation; down to 1/2 pack of cigarettes /day

## 2011-10-31 DIAGNOSIS — I214 Non-ST elevation (NSTEMI) myocardial infarction: Secondary | ICD-10-CM

## 2011-10-31 HISTORY — DX: Non-ST elevation (NSTEMI) myocardial infarction: I21.4

## 2012-01-23 ENCOUNTER — Encounter (HOSPITAL_COMMUNITY): Payer: Self-pay | Admitting: *Deleted

## 2012-01-23 ENCOUNTER — Encounter (HOSPITAL_COMMUNITY)
Admission: EM | Disposition: A | Discharge status: Home / Self Care | Payer: Self-pay | Source: Home / Self Care | Attending: Cardiology

## 2012-01-23 ENCOUNTER — Other Ambulatory Visit: Payer: Self-pay

## 2012-01-23 ENCOUNTER — Inpatient Hospital Stay (HOSPITAL_COMMUNITY)
Admission: EM | Admit: 2012-01-23 | Discharge: 2012-01-25 | DRG: 280 | Disposition: A | Discharge status: Home / Self Care | Payer: 59 | Attending: Cardiology | Admitting: Cardiology

## 2012-01-23 ENCOUNTER — Emergency Department (HOSPITAL_COMMUNITY): Payer: Self-pay

## 2012-01-23 DIAGNOSIS — Z87891 Personal history of nicotine dependence: Secondary | ICD-10-CM | POA: Insufficient documentation

## 2012-01-23 DIAGNOSIS — J9600 Acute respiratory failure, unspecified whether with hypoxia or hypercapnia: Secondary | ICD-10-CM | POA: Diagnosis present

## 2012-01-23 DIAGNOSIS — R5381 Other malaise: Secondary | ICD-10-CM | POA: Diagnosis present

## 2012-01-23 DIAGNOSIS — Z9079 Acquired absence of other genital organ(s): Secondary | ICD-10-CM

## 2012-01-23 DIAGNOSIS — F17200 Nicotine dependence, unspecified, uncomplicated: Secondary | ICD-10-CM | POA: Diagnosis present

## 2012-01-23 DIAGNOSIS — I5181 Takotsubo syndrome: Secondary | ICD-10-CM | POA: Diagnosis present

## 2012-01-23 DIAGNOSIS — J4489 Other specified chronic obstructive pulmonary disease: Secondary | ICD-10-CM

## 2012-01-23 DIAGNOSIS — Z23 Encounter for immunization: Secondary | ICD-10-CM

## 2012-01-23 DIAGNOSIS — Z79899 Other long term (current) drug therapy: Secondary | ICD-10-CM

## 2012-01-23 DIAGNOSIS — Z8249 Family history of ischemic heart disease and other diseases of the circulatory system: Secondary | ICD-10-CM

## 2012-01-23 DIAGNOSIS — Z823 Family history of stroke: Secondary | ICD-10-CM

## 2012-01-23 DIAGNOSIS — J441 Chronic obstructive pulmonary disease with (acute) exacerbation: Secondary | ICD-10-CM | POA: Insufficient documentation

## 2012-01-23 DIAGNOSIS — Z7982 Long term (current) use of aspirin: Secondary | ICD-10-CM

## 2012-01-23 DIAGNOSIS — I214 Non-ST elevation (NSTEMI) myocardial infarction: Secondary | ICD-10-CM

## 2012-01-23 DIAGNOSIS — J069 Acute upper respiratory infection, unspecified: Secondary | ICD-10-CM

## 2012-01-23 DIAGNOSIS — R079 Chest pain, unspecified: Secondary | ICD-10-CM

## 2012-01-23 DIAGNOSIS — I959 Hypotension, unspecified: Secondary | ICD-10-CM | POA: Diagnosis present

## 2012-01-23 DIAGNOSIS — J449 Chronic obstructive pulmonary disease, unspecified: Secondary | ICD-10-CM

## 2012-01-23 HISTORY — PX: LEFT HEART CATHETERIZATION WITH CORONARY ANGIOGRAM: SHX5451

## 2012-01-23 HISTORY — DX: Non-ST elevation (NSTEMI) myocardial infarction: I21.4

## 2012-01-23 LAB — CARDIAC PANEL(CRET KIN+CKTOT+MB+TROPI)
CK, MB: 9.3 ng/mL (ref 0.3–4.0)
CK, MB: 7.4 ng/mL (ref 0.3–4.0)
Relative Index: 4.1 — ABNORMAL HIGH (ref 0.0–2.5)
Total CK: 204 U/L — ABNORMAL HIGH (ref 7–177)
Troponin I: 0.99 ng/mL (ref <0.30)
Troponin I: 0.58 ng/mL (ref <0.30)

## 2012-01-23 LAB — POCT I-STAT 3, ART BLOOD GAS (G3+)
Bicarbonate: 23.3 mEq/L (ref 20.0–24.0)
pH, Arterial: 7.391 (ref 7.350–7.400)
pO2, Arterial: 106 mmHg — ABNORMAL HIGH (ref 80.0–100.0)

## 2012-01-23 LAB — BASIC METABOLIC PANEL
BUN: 10 mg/dL (ref 6–23)
Chloride: 101 mEq/L (ref 96–112)
Glucose, Bld: 124 mg/dL — ABNORMAL HIGH (ref 70–99)
Potassium: 3.7 mEq/L (ref 3.5–5.1)

## 2012-01-23 LAB — CBC
HCT: 42.8 % (ref 36.0–46.0)
HCT: 40.7 % (ref 36.0–46.0)
Hemoglobin: 14.3 g/dL (ref 12.0–15.0)
Hemoglobin: 13.9 g/dL (ref 12.0–15.0)
MCH: 33.9 pg (ref 26.0–34.0)
MCH: 34.2 pg — ABNORMAL HIGH (ref 26.0–34.0)
MCV: 100 fL (ref 78.0–100.0)
RBC: 4.22 MIL/uL (ref 3.87–5.11)
RBC: 4.07 MIL/uL (ref 3.87–5.11)
WBC: 5.6 10*3/uL (ref 4.0–10.5)

## 2012-01-23 LAB — DIFFERENTIAL
Lymphs Abs: 2 10*3/uL (ref 0.7–4.0)
Monocytes Relative: 5 % (ref 3–12)
Neutro Abs: 6.6 10*3/uL (ref 1.7–7.7)
Neutrophils Relative %: 72 % (ref 43–77)

## 2012-01-23 LAB — POCT ACTIVATED CLOTTING TIME: Activated Clotting Time: 100 seconds

## 2012-01-23 LAB — APTT: aPTT: 31 seconds (ref 24–37)

## 2012-01-23 LAB — PROTIME-INR
INR: 0.9 (ref 0.00–1.49)
Prothrombin Time: 12.3 seconds (ref 11.6–15.2)

## 2012-01-23 LAB — HEPATIC FUNCTION PANEL
ALT: 22 U/L (ref 0–35)
Albumin: 3.8 g/dL (ref 3.5–5.2)
Alkaline Phosphatase: 88 U/L (ref 39–117)
Indirect Bilirubin: 0.3 mg/dL (ref 0.3–0.9)
Total Protein: 6.8 g/dL (ref 6.0–8.3)

## 2012-01-23 LAB — CREATININE, SERUM: GFR calc Af Amer: >90 mL/min (ref >90)

## 2012-01-23 LAB — CK TOTAL AND CKMB (NOT AT ARMC)
CK, MB: 11 ng/mL (ref 0.3–4.0)
Relative Index: 4.3 — ABNORMAL HIGH (ref 0.0–2.5)

## 2012-01-23 LAB — TSH: TSH: 0.331 u[IU]/mL — ABNORMAL LOW (ref 0.350–4.500)

## 2012-01-23 SURGERY — LEFT HEART CATHETERIZATION WITH CORONARY ANGIOGRAM
Anesthesia: LOCAL

## 2012-01-23 MED ORDER — HEPARIN BOLUS VIA INFUSION
3000.0000 [IU] | Freq: Once | INTRAVENOUS | Status: AC
  Administered 2012-01-23: 3000 [IU] via INTRAVENOUS

## 2012-01-23 MED ORDER — MIDAZOLAM HCL 2 MG/2ML IJ SOLN
INTRAMUSCULAR | Status: AC
  Filled 2012-01-23: qty 2

## 2012-01-23 MED ORDER — ATORVASTATIN CALCIUM 40 MG PO TABS
40.0000 mg | ORAL_TABLET | Freq: Every day | ORAL | Status: DC
  Administered 2012-01-23 – 2012-01-24 (×2): 40 mg via ORAL
  Filled 2012-01-23 (×3): qty 1

## 2012-01-23 MED ORDER — NITROGLYCERIN IN D5W 200-5 MCG/ML-% IV SOLN: 5.0000 ug/min | INTRAVENOUS | Status: DC

## 2012-01-23 MED ORDER — ACETAMINOPHEN 325 MG PO TABS: 650.0000 mg | ORAL_TABLET | ORAL | Status: DC | PRN

## 2012-01-23 MED ORDER — BUDESONIDE-FORMOTEROL FUMARATE 160-4.5 MCG/ACT IN AERO
2.0000 | INHALATION_SPRAY | Freq: Two times a day (BID) | RESPIRATORY_TRACT | Status: DC
  Administered 2012-01-23: 2 via RESPIRATORY_TRACT
  Filled 2012-01-23: qty 6

## 2012-01-23 MED ORDER — CITALOPRAM HYDROBROMIDE 40 MG PO TABS
40.0000 mg | ORAL_TABLET | Freq: Every day | ORAL | Status: DC
  Administered 2012-01-24 – 2012-01-25 (×2): 40 mg via ORAL
  Filled 2012-01-23 (×2): qty 1

## 2012-01-23 MED ORDER — ALBUTEROL SULFATE (5 MG/ML) 0.5% IN NEBU
5.0000 mg | INHALATION_SOLUTION | Freq: Once | RESPIRATORY_TRACT | Status: AC
Start: 2012-01-23 — End: 2012-01-23
  Administered 2012-01-23: 5 mg via RESPIRATORY_TRACT
  Filled 2012-01-23: qty 1

## 2012-01-23 MED ORDER — NITROGLYCERIN 0.2 MG/ML ON CALL CATH LAB
INTRAVENOUS | Status: AC
  Filled 2012-01-23: qty 1

## 2012-01-23 MED ORDER — IPRATROPIUM BROMIDE HFA 17 MCG/ACT IN AERS
2.0000 | INHALATION_SPRAY | Freq: Four times a day (QID) | RESPIRATORY_TRACT | Status: DC | PRN
  Administered 2012-01-25: 2 via RESPIRATORY_TRACT
  Filled 2012-01-23: qty 12.9

## 2012-01-23 MED ORDER — ALBUTEROL SULFATE (5 MG/ML) 0.5% IN NEBU
2.5000 mg | INHALATION_SOLUTION | Freq: Four times a day (QID) | RESPIRATORY_TRACT | Status: DC | PRN
  Administered 2012-01-23 – 2012-01-24 (×3): 2.5 mg via RESPIRATORY_TRACT
  Filled 2012-01-23 (×3): qty 0.5

## 2012-01-23 MED ORDER — ARFORMOTEROL TARTRATE 15 MCG/2ML IN NEBU
15.0000 ug | INHALATION_SOLUTION | Freq: Two times a day (BID) | RESPIRATORY_TRACT | Status: DC
  Administered 2012-01-23 – 2012-01-25 (×4): 15 ug via RESPIRATORY_TRACT
  Filled 2012-01-23 (×7): qty 2

## 2012-01-23 MED ORDER — ASPIRIN EC 81 MG PO TBEC
81.0000 mg | DELAYED_RELEASE_TABLET | Freq: Every day | ORAL | Status: DC
  Administered 2012-01-24 – 2012-01-25 (×2): 81 mg via ORAL
  Filled 2012-01-23 (×2): qty 1

## 2012-01-23 MED ORDER — METHYLPREDNISOLONE SODIUM SUCC 125 MG IJ SOLR
125.0000 mg | Freq: Once | INTRAMUSCULAR | Status: AC
  Administered 2012-01-23: 125 mg via INTRAVENOUS

## 2012-01-23 MED ORDER — ASPIRIN 81 MG PO CHEW
324.0000 mg | CHEWABLE_TABLET | Freq: Once | ORAL | Status: AC
Start: 2012-01-23 — End: 2012-01-23
  Administered 2012-01-23: 324 mg via ORAL
  Filled 2012-01-23: qty 4

## 2012-01-23 MED ORDER — HEPARIN SODIUM (PORCINE) 5000 UNIT/ML IJ SOLN
5000.0000 [IU] | Freq: Three times a day (TID) | INTRAMUSCULAR | Status: DC
  Administered 2012-01-23 – 2012-01-25 (×5): 5000 [IU] via SUBCUTANEOUS
  Filled 2012-01-23 (×8): qty 1

## 2012-01-23 MED ORDER — BUDESONIDE 0.25 MG/2ML IN SUSP
0.2500 mg | Freq: Two times a day (BID) | RESPIRATORY_TRACT | Status: DC
  Administered 2012-01-24 – 2012-01-25 (×3): 0.25 mg via RESPIRATORY_TRACT
  Filled 2012-01-23 (×7): qty 2

## 2012-01-23 MED ORDER — HEPARIN (PORCINE) IN NACL 100-0.45 UNIT/ML-% IJ SOLN
12.0000 [IU]/kg/h | INTRAMUSCULAR | Status: DC
  Administered 2012-01-23: 12 [IU]/kg/h via INTRAVENOUS
  Filled 2012-01-23: qty 250

## 2012-01-23 MED ORDER — IPRATROPIUM BROMIDE 0.02 % IN SOLN
0.5000 mg | Freq: Four times a day (QID) | RESPIRATORY_TRACT | Status: DC | PRN
  Administered 2012-01-23 – 2012-01-24 (×3): 0.5 mg via RESPIRATORY_TRACT
  Filled 2012-01-23 (×3): qty 2.5

## 2012-01-23 MED ORDER — SODIUM CHLORIDE 0.9 % IV SOLN: INTRAVENOUS | Status: AC

## 2012-01-23 MED ORDER — HEPARIN (PORCINE) IN NACL 2-0.9 UNIT/ML-% IJ SOLN
INTRAMUSCULAR | Status: AC
  Filled 2012-01-23: qty 2000

## 2012-01-23 MED ORDER — SODIUM CHLORIDE 0.9 % IV SOLN: 250.0000 mL | INTRAVENOUS | Status: DC | PRN

## 2012-01-23 MED ORDER — ZOLPIDEM TARTRATE 5 MG PO TABS
5.0000 mg | ORAL_TABLET | Freq: Every evening | ORAL | Status: DC | PRN
  Administered 2012-01-23 – 2012-01-24 (×2): 5 mg via ORAL
  Filled 2012-01-23 (×2): qty 1

## 2012-01-23 MED ORDER — NITROGLYCERIN 0.4 MG SL SUBL: 0.4000 mg | SUBLINGUAL_TABLET | SUBLINGUAL | Status: DC | PRN

## 2012-01-23 MED ORDER — SODIUM CHLORIDE 0.9 % IJ SOLN
3.0000 mL | Freq: Two times a day (BID) | INTRAMUSCULAR | Status: DC
  Administered 2012-01-23 – 2012-01-24 (×3): 3 mL via INTRAVENOUS

## 2012-01-23 MED ORDER — IPRATROPIUM BROMIDE 0.02 % IN SOLN
0.5000 mg | Freq: Once | RESPIRATORY_TRACT | Status: AC
  Administered 2012-01-23: 0.5 mg via RESPIRATORY_TRACT

## 2012-01-23 MED ORDER — NITROGLYCERIN IN D5W 200-5 MCG/ML-% IV SOLN
2.0000 ug/min | Freq: Once | INTRAVENOUS | Status: AC
  Administered 2012-01-23: 5 ug/min via INTRAVENOUS
  Filled 2012-01-23: qty 250

## 2012-01-23 MED ORDER — LIDOCAINE HCL (PF) 1 % IJ SOLN
INTRAMUSCULAR | Status: AC
  Filled 2012-01-23: qty 30

## 2012-01-23 MED ORDER — ALBUTEROL SULFATE HFA 108 (90 BASE) MCG/ACT IN AERS
2.0000 | INHALATION_SPRAY | Freq: Four times a day (QID) | RESPIRATORY_TRACT | Status: DC | PRN
  Administered 2012-01-25: 2 via RESPIRATORY_TRACT
  Filled 2012-01-23: qty 6.7

## 2012-01-23 MED ORDER — ONDANSETRON HCL 4 MG/2ML IJ SOLN: 4.0000 mg | Freq: Four times a day (QID) | INTRAMUSCULAR | Status: DC | PRN

## 2012-01-23 MED ORDER — ALBUTEROL SULFATE (5 MG/ML) 0.5% IN NEBU
5.0000 mg | INHALATION_SOLUTION | Freq: Once | RESPIRATORY_TRACT | Status: AC
  Administered 2012-01-23: 5 mg via RESPIRATORY_TRACT

## 2012-01-23 MED ORDER — SODIUM CHLORIDE 0.9 % IJ SOLN: 3.0000 mL | INTRAMUSCULAR | Status: DC | PRN

## 2012-01-23 NOTE — ED Notes (Addendum)
[facility] at bedside, Report given to [facility], one bag of pt belongings remained with pt

## 2012-01-23 NOTE — ED Notes (Signed)
ZOX:[id number]<AV> Expected date:01/23/12<BR> Expected time: 5:11 AM<BR> Means of arrival:Ambulance<BR> Comments:<BR> Short of breath/copd

## 2012-01-23 NOTE — ED Provider Notes (Signed)
History     CSN: [id number]  Arrival date & time 01/23/12  0548   First MD Initiated Contact with Patient 01/23/12 [phone]      Chief Complaint  Patient presents with  . Shortness of Breath    (Consider location/radiation/quality/duration/timing/severity/associated sxs/prior treatment) HPI Comments: Patient with hx COPD reports worsening SOB over one month.  States that previously she would get SOB with walking from work to her car, now is getting SOB walking from her bedroom to the bathroom.  Yesterday she was SOB despite home breathing treatments and called 911, was given oxygen which helped and patient then refused transport to ED.  This morning she woke up around 4am unable to breath, called 911 and was transported here.  States she is currently breathing much better after treatments but is "tired."  Also reports chest soreness and occasional muscle spasms in her chest bilaterally.  Denies fevers, cough, sore throat.  No recent change in medications.  Patient smokes 1/2 ppd currently, recently cut down from 1ppd.    The history is provided by the patient.    Past Medical History  Diagnosis Date  . COPD (chronic obstructive pulmonary disease)   . Heart murmur     Past Surgical History  Procedure Date  . Abdominal hysterectomy     History reviewed. No pertinent family history.  History  Substance Use Topics  . Smoking status: Current Everyday Smoker  . Smokeless tobacco: Not on file  . Alcohol Use: Yes    OB History    Grav Para Term Preterm Abortions TAB SAB Ect Mult Living                  Review of Systems  Constitutional: Positive for fatigue. Negative for fever.  HENT: Negative for sore throat.   Cardiovascular: Negative for leg swelling.  Gastrointestinal: Negative for vomiting, abdominal pain and diarrhea.  Genitourinary: Negative for dysuria, urgency and frequency.  All other systems reviewed and are negative.    Allergies  Codeine  Home Medications     Current Outpatient Rx  Name Route Sig Dispense Refill  . ALBUTEROL SULFATE HFA 108 (90 BASE) MCG/ACT IN AERS Inhalation Inhale 2 puffs into the lungs every 6 (six) hours as needed.      . ALBUTEROL SULFATE (2.5 MG/3ML) 0.083% IN NEBU Nebulization Take 3 mLs (2.5 mg total) by nebulization every 4 (four) hours as needed for wheezing or shortness of breath. 75 mL 0  . BUDESONIDE-FORMOTEROL FUMARATE 160-4.5 MCG/ACT IN AERO Inhalation Inhale 2 puffs into the lungs 2 (two) times daily.      Marland Kitchen CITALOPRAM HYDROBROMIDE 40 MG PO TABS Oral Take 40 mg by mouth daily.      . IPRATROPIUM BROMIDE HFA 17 MCG/ACT IN AERS Inhalation Inhale 2 puffs into the lungs every 6 (six) hours.        BP 94/54  Pulse 86  Temp(Src) 97.9 F (36.6 C) (Oral)  Resp 20  SpO2 94%  Physical Exam  Nursing note and vitals reviewed. Constitutional: She is oriented to person, place, and time. She appears well-developed and well-nourished. No distress.  HENT:  Head: Normocephalic and atraumatic.  Neck: Neck supple.  Cardiovascular: Normal rate, regular rhythm and normal heart sounds.   Pulmonary/Chest: No respiratory distress. She has wheezes. She has no rhonchi. She has no rales.       Mild inspiratory wheeze.  Prolonged expirations.    Abdominal: Soft. Bowel sounds are normal. She exhibits no distension. There is  no tenderness. There is no rebound and no guarding.  Musculoskeletal: Normal range of motion. She exhibits no edema and no tenderness.  Neurological: She is alert and oriented to person, place, and time.  Skin: She is not diaphoretic.  Psychiatric: She has a normal mood and affect. Her behavior is normal. Judgment and thought content normal.    ED Course  Procedures (including critical care time)  Labs Reviewed  CBC - Abnormal; Notable for the following:    MCV 101.4 (*)    All other components within normal limits  BASIC METABOLIC PANEL - Abnormal; Notable for the following:    Glucose, Bld 124 (*)     All other components within normal limits  TROPONIN I - Abnormal; Notable for the following:    Troponin I 1.61 (*)    All other components within normal limits  DIFFERENTIAL   Dg Chest 2 View  01/23/2012  *RADIOLOGY REPORT*  Clinical Data: Shortness of breath; difficulty breathing.  History of smoking.  CHEST - 2 VIEW  Comparison: Chest radiograph performed 05/26/2011  Findings: The lungs are hyperexpanded, with flattening of the hemidiaphragms, compatible with COPD.  Chronic peribronchial thickening is seen.  There is no evidence of focal opacification, pleural effusion or pneumothorax.  Mild biapical scarring is again noted.  The heart is normal in size; the mediastinal contour is within normal limits.  No acute osseous abnormalities are seen.  IMPRESSION: Findings of COPD; no acute cardiopulmonary process seen.  Original Report Authenticated By: Tonia Ghent, M.D.    6:24 AM Patient seen and examined.  Dr Dierdre Highman is aware of patient.    7:02 AM EKG reviewed with Dr Dierdre Highman and Dr Patria Mane.  New T wave inversion in lateral leads.  Dr Patria Mane to see patient.     Date: 01/23/2012  Rate: 81  Rhythm: normal sinus rhythm  QRS Axis: normal  Intervals: normal  ST/T Wave abnormalities: t wave inversions  Conduction Disutrbances:none  Narrative Interpretation:   Old EKG Reviewed: changes noted  7:49 AM Troponin elevated.  Dr Patria Mane made aware.  ASA and heparin drip ordered per Dr Patria Mane' recommendations.  Dr Patria Mane to consult cardiology.     1. NSTEMI (non-ST elevated myocardial infarction)       MDM  Patient with increasing SOB, DoE x 1 month, much worse since yesterday, associated soreness in her chest.  EKG with new T wave inversions, troponin elevated.  Patient seen with Dr Patria Mane who is arranging disposition with cardiology, likely transfer to [facility] for admission.          Dillard Cannon [place], [place] 01/23/12 [phone]

## 2012-01-23 NOTE — Consult Note (Signed)
Name: Heidi [name] MRN: [id number] DOB: 08-Jan-1956  LOS: 0  PULMONARY CONSULT NOTE  History of Present Illness: This is a 56 y/o female with COPD who was admitted to [facility] hospital on 3/26 with chest tightness and shortness of breath.  She underwent an urgent LHC after her cardiac enzymes were elevated and her EKG was worrisome for LAD disease.  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  She notes that prior to her presentation she noted chest tightness and inability to get enough air.  She did not have a change in cough or sputum production. No fevers or chills or leg swelling.  She feels somewhat better after receiving breathing treatments and oxygen today.  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 COPD (chronic obstructive pulmonary disease)    Past Surgical History  Procedure Date  . Abdominal hysterectomy    Prior to Admission medications   Medication Sig Start Date End Date Taking? Authorizing Provider  albuterol (PROVENTIL HFA;VENTOLIN HFA) 108 (90 BASE) MCG/ACT inhaler Inhale 2 puffs into the lungs every 6 (six) hours as needed. Shortness of breath   Yes Historical Provider, MD  albuterol (PROVENTIL) (2.5 MG/3ML) 0.083% nebulizer solution Take 3 mLs (2.5 mg total) by nebulization every 4 (four) hours as needed for wheezing or shortness of breath. 10/16/11 10/15/12 Yes Dawn Vidal Schwalbe, PA  budesonide-formoterol (SYMBICORT) 160-4.5 MCG/ACT inhaler Inhale 2 puffs into the lungs 2 (two) times daily.     Yes Historical Provider, MD  ipratropium (ATROVENT HFA) 17 MCG/ACT inhaler Inhale 2 puffs into the lungs every 6 (six) hours.     Yes Historical Provider, MD  citalopram (CELEXA) 40 MG tablet Take 40 mg by mouth daily.      Historical Provider, MD    Allergies  Allergen Reactions  . Codeine Nausea Only   Family History  Problem Relation Age of Onset  . Heart attack Mother     deceased from massive MI at age 72  . Liver disease Father     fatty liver; living, age 58  . Stroke Brother     at age 30, living   . Hypertension Brother     living, age 76   Social History  reports that she has been smoking Cigarettes.  She has never used smokeless tobacco. She reports that she drinks alcohol. She reports that she does not use illicit drugs.  Review Of Systems   Gen: Denies fever, chills, weight change, fatigue, night sweats HEENT: Denies blurred vision, double vision, hearing loss, tinnitus, sinus congestion, rhinorrhea, sore throat, neck stiffness, dysphagia PULM:  Per hpi CV: per hpi GI: Denies abdominal pain, nausea, vomiting, diarrhea, hematochezia, melena, constipation, change in bowel habits GU: Denies dysuria, hematuria, polyuria, oliguria, urethral discharge Endocrine: Denies hot or cold intolerance, polyuria, polyphagia or appetite change Derm: Denies rash, dry skin, scaling or peeling skin change Heme: Denies easy bruising, bleeding, bleeding gums Neuro: Denies headache, numbness, weakness, slurred speech, loss of memory or consciousness  Vital Signs:   , Filed Vitals:   01/23/12 1404 01/23/12 1408 01/23/12 1545 01/23/12 2024  BP:   110/70   Pulse: 109  77   Temp:   97.8 F (36.6 C)   TempSrc:      Resp:   18  Height:      Weight:      SpO2:  99% 96% 97%  3/26 SpO2 94% on RA, 97% on 2LNC  Physical Examination: Gen:  no acute distress HEENT: NCAT, PERRL, EOMi, OP clear,  Neck: supple without masses PULM: Wheezing anteriorly, diminished airmovement bilaterally CV: RRR, no mgr, no JVD AB: BS+, soft, nontender, no hsm Ext: warm, no edema, no clubbing, no cyanosis Derm: no rash or skin breakdown Neuro: A&Ox4, CN II-XII intact, strength 5/5 in all 4 extremities Psyche: Normal mood and affect  Labs and  Imaging:   CBC    Component Value Date/Time   WBC 5.6 01/23/2012 1815   RBC 4.07 01/23/2012 1815   HGB 13.9 01/23/2012 1815   HCT 40.7 01/23/2012 1815   PLT 246 01/23/2012 1815   MCV 100.0 01/23/2012 1815   MCH 34.2* 01/23/2012 1815   MCHC 34.2 01/23/2012 1815   RDW 12.7 01/23/2012 1815   LYMPHSABS 2.0 01/23/2012 0626   MONOABS 0.5 01/23/2012 0626   EOSABS 0.1 01/23/2012 0626   BASOSABS 0.0 01/23/2012 0626   BMET    Component Value Date/Time   NA 136 01/23/2012 0626   K 3.7 01/23/2012 0626   CL 101 01/23/2012 0626   CO2 25 01/23/2012 0626   GLUCOSE 124* 01/23/2012 0626   BUN 10 01/23/2012 0626   CREATININE 0.64 01/23/2012 1815   CALCIUM 9.8 01/23/2012 0626   GFRNONAA >90 01/23/2012 1815   GFRAA >90 01/23/2012 1815    3/26 CXR: Marked Emphysema, no infiltrate  Assessment and Plan:  56 y/o female with COPD (no PFT's on record) who was admitted on 3/26 to [facility] with chest pain, elevated cardiac enzymes and an abnormal EKG.  Her LHC showed normal coronary arteries but concentric hypokinesis in the left mid ventricle.  The primary service suspects a tako-tsubo varient.  PCCM consulted for dyspnea out of proportion to her cardiac findings.  On my evaluation this evening (3/26) she is feeling better after receiving bronchodilators and steroids earlier today.  The degree of severity of her COPD is uncertain, but she has marked emphysema on CXR.  COPD (06/18/2007)   Assessment: perhaps in a mild flare, minimal wheezing tonight.   Plan:  -pulmicort and brovana while in hospital, then change back to symbicort (was not getting symbicort even though it was on her med list) -scheduled and prn duoneb -no indication for antibiotics -already received solumedrol 3/26, if no improvement on 3/27 restart systemic steroids -needs home O2 eval with RT/PT prior to discharge -needs PFTs, but would wait one month after this hospitalization considering NSTEMI/acute cardiac issue Hyacinth Meeker, Crapo et al. General Considerations  for Lung Testing, Eur Respir J 2005; 26: 153-161) -we will make arrangements for an outpatient pulmonary evaluation prior to discharge  Chest pain (01/23/2012)   Assessment: ddx tako-tsubo vs. pe vs. chest soreness from cough? LHC consistent with tako-tsubo   Plan:  -per cardiology  TOBACCO ABUSE (04/06/2009)   Assessment:    Plan:  -counseled to quit at length -tobacco cessation counselor consult placed  Heber Greenwood, M.D. Pulmonary and Critical Care Medicine [facility] Pager: [phone]  01/23/2012, 8:15 PM

## 2012-01-23 NOTE — Interval H&P Note (Signed)
History and Physical Interval Note:  01/23/2012 2:12 PM  Heidi [name]  has presented today for surgery, with the diagnosis of NSTEMI  The various methods of treatment have been discussed with the patient and family. After consideration of risks, benefits and other options for treatment, the patient has consented to  Procedure(s) (LRB): LEFT HEART CATHETERIZATION WITH CORONARY ANGIOGRAM (N/A) and Possible CORONARY ANGIOPLASTY as a surgical intervention .  The patients' history has been reviewed, patient examined, no change in status, stable for surgery.  I have reviewed the patients' chart and labs.  Questions were answered to the patient's satisfaction.     [name]

## 2012-01-23 NOTE — ED Notes (Signed)
Dr. Patria Mane in room explaining plan of care to patient. Pt understood her plan of care, and denied any questions.

## 2012-01-23 NOTE — Progress Notes (Signed)
Pt arrived on floor via carelink. VSS. No complaints of CP/SOB at this time. Dr. Myrtis Ser on floor and was made aware of patients arrival. Will wait for admission orders and continue to monitor patient closely. Ramond Craver, RN

## 2012-01-23 NOTE — ED Provider Notes (Signed)
Medical screening examination/treatment/procedure(s) were conducted as a shared visit with non-physician practitioner(s) and myself.  I personally evaluated the patient during the encounter  Yesterday the patient awoke with chest soreness and shortness of breath.  She reports a soreness in her chest last administered yesterday.  Her EKG shows anterior lateral T wave inversions.  Her troponin is elevated.  She reports no longer having chest soreness.  She has no shortness of breath at this time.  Heparin and nitroglycerin.  Accepted to [facility] by Dr. Myrtis Ser  CRITICAL CARE Performed by: Lyanne Co Total critical care time: 30 Critical care time was exclusive of separately billable procedures and treating other patients. Critical care was necessary to treat or prevent imminent or life-threatening deterioration. Critical care was time spent personally by me on the following activities: development of treatment plan with patient and/or surrogate as well as nursing, discussions with consultants, evaluation of patient's response to treatment, examination of patient, obtaining history from patient or surrogate, ordering and performing treatments and interventions, ordering and review of laboratory studies, ordering and review of radiographic studies, pulse oximetry and re-evaluation of patient's condition.   Lyanne Co, MD 01/23/12 [id number]

## 2012-01-23 NOTE — ED Notes (Signed)
Per EMS: ems called to pt's home with c/o of shortness of breath. Pt has hx of COPD. Pt given 125 mg of solu-medrol, 5 mg albuterol and 0.5 mg of atrovent. Lungs sounds diminished. A&O x 4. Pt in no apparent distress upon arrival

## 2012-01-23 NOTE — Pre-Procedure Instructions (Signed)
Cardiac Cath Procedure Note:  Indication:  NSTEMI  Procedures performed:  1) Selective coronary angiography 2) Left heart catheterization 3) Left ventriculogram  Description of procedure:   The risks and indication of the procedure were explained. Consent was signed and placed on the chart. An appropriate timeout was taken prior to the procedure. The right groin was prepped and draped in the routine sterile fashion and anesthetized with 1% local lidocaine.   A 5 FR arterial sheath was placed in the right femoral artery using a modified Seldinger technique. Standard catheters including a JL4, JR4 and angled pigtail were used. All catheter exchanges were made over a wire.  Complications:  None apparent  Findings:  Ao Pressure: 106/71 (87) LV Pressure:  114/8/14 There was no signficant gradient across the aortic valve on pullback.  Left main: Normal  LAD: Normal  LCX: Normal  RCA: Normal  LV-gram done in the RAO projection: Concentric hypokinesis in mid-ventricle. Base and apex contract well. EF 40-45%  Assessment:  1. Normal coronary arteries 2. 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 Proceed with echo. Not candidate for b-blocker due to COPD flare. Use ACE-I/ARB as BP tolerates.   Ezeriah Luty 2:40 PM

## 2012-01-23 NOTE — H&P (Signed)
History and Physical  Patient ID: Heidi [name] MRN: [id number], SOB: 07-08-1956 56 y.o. Date of Encounter: 01/23/2012, 12:29 PM  Primary Physician: Lehman Prom, NP, NP Primary Cardiologist: new to  - seen by Dr. Myrtis Ser  Chief Complaint: SOB and chest tightness  HPI:   56 yo female with past medical hx significant for COPD but no known cardiac disease. She was brought to [facility] by EMS this morning with complaints of SOB and hyperventilation associated with sternal chest tightness which woke her from sleep around 4AM. She felt like she was going to pass out, but did not actually lose consciousness The chest pain was rated as 6/10 and constant; it is not stabbing and is instead described as soreness, "feeling like a bruise". It is not tender to the touch. It did not radiate but she does note some intermittent L shoulder "burning sensation" in the last 6 mo as well as intermittent jaw "numbness" in the last 6 months which were also present this AM. She did have diaphoresis and palpitations, stating that she could "hear her heart beating hard." The pt notes that she experiences SOB with hyperventilation frequently, usually worse in the morning, but this is typically relieved with breathing treatment and is not associated with chest tightness. The pt does state that she has been having increased DOE within the last month and she sometimes struggles with completing ADLs and tasks at work secondary to SOB (never experienced chest pain symptoms however until 2 days ago). She denies any cough. She also reports recent daily episodes of hot flashes/sweating that cause her clothes to stick to her with no particular provoking factor. The pt also notes that EMS was at her home yesterday morning following a similar episode of SOB and hyperventilation. This episode was also associated with pre-syncope. She did develop some chest tightness following hyperventilation but after receiving 2 breathing  treatments from EMS, she felt better and decided against going to the hospital.   At [facility] this morning, the pt received heparin drip and nitro. The pt was transferred to [facility] after the pt's first POC troponin was found to be elevated to 1.61 in setting of new lateral T wave inversion on EKG. A CXR showed findings consistent with COPD with no acute cardiopulmonary findings. .  Since transfer [facility], the pt has not had chest pain according to nurse; however, during interview the pt did develop some left-sided "chest tightness", though she still rated this as 0/10. After getting back to bed from using the bedside commode, the patient developed increased SOB, work of breathing without desaturation and took about 2 minutes to get back to breathing baseline. She denies any recent travel, surgery, bedrest or injury.  Past Medical History  Diagnosis Date  . COPD (chronic obstructive pulmonary disease)      Surgical History:  Past Surgical History  Procedure Date  . Abdominal hysterectomy      Home Meds: Prior to Admission medications   Medication Sig Start Date End Date Taking? Authorizing Provider  albuterol (PROVENTIL HFA;VENTOLIN HFA) 108 (90 BASE) MCG/ACT inhaler Inhale 2 puffs into the lungs every 6 (six) hours as needed. Shortness of breath   Yes Historical Provider, MD  albuterol (PROVENTIL) (2.5 MG/3ML) 0.083% nebulizer solution Take 3 mLs (2.5 mg total) by nebulization every 4 (four) hours as needed for wheezing or shortness of breath. 10/16/11 10/15/12 Yes Dawn Vidal Schwalbe, PA  budesonide-formoterol (SYMBICORT) 160-4.5 MCG/ACT inhaler Inhale 2 puffs into the lungs 2 (two)  times daily.     Yes Historical Provider, MD  ipratropium (ATROVENT HFA) 17 MCG/ACT inhaler Inhale 2 puffs into the lungs every 6 (six) hours.     Yes Historical Provider, MD  citalopram (CELEXA) 40 MG tablet Take 40 mg by mouth daily.      Historical Provider, MD    Allergies:  Allergies  Allergen Reactions    . Codeine Nausea Only    History   Social History  . Marital Status: Divorced    Spouse Name: N/A    Number of Children: N/A  . 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 Smoking status: Current Everyday Smoker    Types: Cigarettes    Last Attempt to Quit: 01/21/2012  . Smokeless tobacco: Never Used   Comment: smoked 1 ppd X 30 years, has recently cut down to 3 packs per week; has quit smoking entirely X 2 days  . Alcohol Use: Yes     1-2 beers per week  . Drug Use: No  . Sexually Active: Not on file   Other Topics Concern  . 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 She has two children, a daughter and son, who live in [place] area. She lives with her brother.     Family History  Problem Relation Age of Onset  . Heart attack Mother     deceased from massive MI at age 70  . Liver disease Father     fatty liver; living, age 25  . Stroke Brother     at age 20, living   . Hypertension Brother     living, age 77    Review of Systems: General: +++ occasional chills. No fever. +++ daytime sweats. Pt has lost about 5 lbs in the last 3 months which she attributes to lack of appetite. Cardiovascular: +++ chest tightness (See HPI). No edema or orthopnea. +++ palpitations, +++ paroxysmal nocturnal dyspnea, +++shortness of breath or dyspnea on exertion Dermatological: negative for rash Respiratory: +++ cough, +++ wheezing Urologic: negative for hematuria Abdominal: +++ abdominal bloating without pain; negative for nausea, vomiting, diarrhea, bright red blood per rectum, melena, or hematemesis Neurologic: +++ intermittent blurred vision within the last 6 mo, +++ pre-syncope, +++ dizziness All other systems reviewed and are otherwise negative except as noted above.  Labs:   Lab Results  Component Value Date   WBC 9.2 01/23/2012   HGB 14.3  01/23/2012   HCT 42.8 01/23/2012   MCV 101.4* 01/23/2012   PLT 249 01/23/2012     Lab 01/23/12 0626  NA 136  K 3.7  CL 101  CO2 25  BUN 10  CREATININE 0.66  CALCIUM 9.8  PROT --  BILITOT --  ALKPHOS --  ALT --  AST --  GLUCOSE 124*    Basename 01/23/12 0800 01/23/12 0626  CKTOTAL 258* --  CKMB 11.0* --  TROPONINI -- 1.61*   Lab Results  Component Value Date   CHOL 138 07/12/2009   HDL 66 07/12/2009   LDLCALC 54 07/12/2009   TRIG 89 07/12/2009   Radiology/Studies:  01/23/2012  CHEST - 2 VIEW  Comparison: Chest radiograph performed 05/26/2011  Clinical Data: Shortness of breath; difficulty breathing.  History of smoking.    Findings: The lungs are hyperexpanded, with flattening of the hemidiaphragms, compatible with COPD.  Chronic peribronchial thickening is seen.  There is no evidence of focal opacification, pleural effusion or pneumothorax.  Mild biapical scarring is again noted.  The heart is normal in size; the mediastinal contour is within normal limits.  No acute osseous abnormalities are seen.  IMPRESSION: Findings of COPD; no acute cardiopulmonary process seen.  Original Report Authenticated By: Tonia Ghent, M.D.   EKG: Sinus rhythm, rate 81. T wave inversion lateral leads. Follow-up EKG shows evolving ST abnormalities V2, thus Dr. Jens Som was called for review.  Physical Exam: Blood pressure 106/71, pulse 90, temperature 97.8 F (36.6 C), temperature source Oral, resp. rate 18, height 5\' 6"  (1.676 m), weight 110 lb (49.896 kg), SpO2 98.00%. General: Appears slightly older than stated age; chronically-ill appearing, in no acute distress. Pt speaks very quietly, has difficulty projecting voice. Head: Normocephalic, atraumatic, sclera non-icteric, no xanthomas, nares are without discharge.  Neck: Negative for carotid bruits. JVD not elevated. Lungs: Pt appears slightly barrel-chested. Lung sounds diminished with wheezing appreciated at bases bilaterally, breathing is  slightly labored and deep breathing causes cough. Heart: Very distant heart sounds; RRR with S1 S2. No murmurs, rubs, or gallops appreciated. Abdomen: Soft, non-tender, distended with normoactive bowel sounds. No hepatomegaly. No rebound/guarding. No obvious abdominal masses. Msk:  Chest wall is not tender. Pt has some difficulty sitting up and moving around in bed. Extremities: No clubbing or cyanosis. Capillary refill normal. No edema.  Distal pedal pulses are 2+ and equal bilaterally. Neuro: Alert and oriented X 3. Moves all extremities spontaneously. Psych:  Responds to questions appropriately with a normal affect.    ASSESSMENT AND PLAN:   1. Chest pain/NSTEMI: It is unclear if her dyspnea/cardiac enzymes are indicative of a primary cardiac etiology, or if they are the result of an acute hypoxic respiratory event related to her poor pulmonary function. She attributes her SOB/hyperventilation to her prior COPD, however underlying cardiac disease is a concern in setting of elevated troponin and EKG changes. Pt will likely require a cardiac cath, perhaps sooner than later given continued symptoms to rule out multivessel disease. Continue heparin, ASA, nitro infusion. Will hold off on BB given severe COPD.  2. SOB/dyspnea on exertion: COPD significant contributor to SOB and DOE however cannot rule out hypoxia secondary to cardiac obstruction. Pt also seems to be significantly deconditioned. Given ROS including daytime sweats, some weight loss, tobacco history, and anorexia, may have to consider additional pulmonary causes. May require a pulmonary consult this admission. No signs of infection. No signs of DVT, patient is not hypoxic or tachycardic but may need rule out for PE if otherwise workup is unremarkable.  3. COPD: Pt did receive 2 nebulizer treatments and 125mg  Solumedrol on admission. COPD likely significantly contributing to both problem #1 and #2. Consider pulmonary consult this admission.  Continue home inhaler regimen and add nebs PRN.   Signed, Ronie Spies PA-C 01/23/2012, 12:29 PM As above, the patient seen and examined. Briefly 55 year old female with past medical history of COPD for evaluation of non-ST elevation myocardial infarction. Patient has chronic dyspnea on exertion. However yesterday morning she awoke with severe dyspnea and chest tightness. There was also associated near syncope. EMS was called but her symptoms improved and she elected to stay at home. At 4:30 this morning she again developed severe dyspnea awakening her from sleep. She has had chest tightness most of the day. EMS was called and she was brought to the emergency room. Initial troponin is abnormal. Her initial electrocardiogram showed sinus  rhythm at a rate of 81. There is anterior lateral T-wave inversion worrisome for an LAD lesion. Followup electrocardiogram today shows sinus rhythm with continued evolution of her T-wave changes. Plan treat with aspirin, heparin and nitroglycerin. No beta blocker given severity of COPD on examination. Add statin. Given persistent chest tightness she will need urgent cardiac catheterization. The risks and benefits were discussed and the patient agrees to proceed. Discussed the importance of discontinuing her tobacco use. She will need a pulmonary consult for her COPD. Clearly it is severe on examination.  Olga Millers

## 2012-01-23 NOTE — Progress Notes (Signed)
ANTICOAGULATION CONSULT NOTE - Initial Consult  Pharmacy Consult for Heparin Indication: chest pain/ACS  Allergies  Allergen Reactions  . Codeine Nausea Only   Patient Measurements: Height: 5\' 6"  (167.6 cm) Weight: 110 lb (49.896 kg) IBW/kg (Calculated) : 59.3   Vital Signs: Temp: 98.8 F (37.1 C) (03/26 0802) Temp src: Oral (03/26 0802) BP: 105/58 mmHg (03/26 0802) Pulse Rate: 85  (03/26 0802)  Labs:  Alvira Philips 01/23/12 0626  HGB 14.3  HCT 42.8  PLT 249  APTT --  LABPROT --  INR --  HEPARINUNFRC --  CREATININE 0.66  CKTOTAL --  CKMB --  TROPONINI 1.61*   Estimated Creatinine Clearance: 62.6 ml/min (by C-G formula based on Cr of 0.66).  Medical History: Past Medical History  Diagnosis Date  . COPD (chronic obstructive pulmonary disease)   . Heart murmur    Medications:   (Not in a hospital admission)  Assessment:  55yo F with COPD and ongoing tobacco use with worsening SOB x 1 month. Also chest soreness. EKG changes and elevated Troponin.  Starting heparin for ACS.  Baseline PTT, PT/INR ordered. On no anticoagulants PTA.  Goal of Therapy:  Heparin level 0.3-0.7 units/ml   Plan:   Heparin 3000units x 1 then infusion at 600units/hr.  F/u baseline coags.   Check heparin level in 6hrs.   Reece Packer 01/23/2012,8:23 AM

## 2012-01-24 ENCOUNTER — Other Ambulatory Visit: Payer: Self-pay

## 2012-01-24 DIAGNOSIS — J9600 Acute respiratory failure, unspecified whether with hypoxia or hypercapnia: Secondary | ICD-10-CM

## 2012-01-24 DIAGNOSIS — I214 Non-ST elevation (NSTEMI) myocardial infarction: Secondary | ICD-10-CM

## 2012-01-24 DIAGNOSIS — J810 Acute pulmonary edema: Secondary | ICD-10-CM

## 2012-01-24 DIAGNOSIS — I219 Acute myocardial infarction, unspecified: Secondary | ICD-10-CM

## 2012-01-24 LAB — BASIC METABOLIC PANEL
BUN: 8 mg/dL (ref 6–23)
CO2: 27 mEq/L (ref 19–32)
Calcium: 9.4 mg/dL (ref 8.4–10.5)
GFR calc non Af Amer: >90 mL/min (ref >90)
Glucose, Bld: 108 mg/dL — ABNORMAL HIGH (ref 70–99)
Potassium: 3.9 mEq/L (ref 3.5–5.1)
Sodium: 139 mEq/L (ref 135–145)

## 2012-01-24 LAB — CBC
HCT: 42.7 % (ref 36.0–46.0)
Hemoglobin: 14.4 g/dL (ref 12.0–15.0)
MCH: 34 pg (ref 26.0–34.0)
MCHC: 33.7 g/dL (ref 30.0–36.0)
RBC: 4.23 MIL/uL (ref 3.87–5.11)

## 2012-01-24 LAB — LIPID PANEL
HDL: 80 mg/dL (ref >39)
LDL Cholesterol: 41 mg/dL (ref 0–99)
Triglycerides: 65 mg/dL (ref <150)
VLDL: 13 mg/dL (ref 0–40)

## 2012-01-24 MED ORDER — ALBUTEROL SULFATE (5 MG/ML) 0.5% IN NEBU
2.5000 mg | INHALATION_SOLUTION | Freq: Four times a day (QID) | RESPIRATORY_TRACT | Status: DC
  Administered 2012-01-24 – 2012-01-25 (×3): 2.5 mg via RESPIRATORY_TRACT
  Filled 2012-01-24 (×3): qty 0.5

## 2012-01-24 MED ORDER — IPRATROPIUM BROMIDE 0.02 % IN SOLN
0.5000 mg | Freq: Four times a day (QID) | RESPIRATORY_TRACT | Status: DC
  Administered 2012-01-24 – 2012-01-25 (×3): 0.5 mg via RESPIRATORY_TRACT
  Filled 2012-01-24 (×3): qty 2.5

## 2012-01-24 NOTE — Progress Notes (Addendum)
CARDIAC REHAB PHASE I   PRE:  Rate/Rhythm: 92 SR    BP: sitting 96/54    SaO2: 93-94 RA  MODE:  Ambulation: 28 ft   POST:  Rate/Rhythm: 109     BP: sitting 124/74     SaO2: 95 RA  Upon entering room pt seems SOB, anxious. Sts she just took O2 off. SaO2 93 RA. Pt attempted to ambulate but very DOE, then stated she felt lightheaded and had blurred vision. Multiple rest breaks for 28 ft outside room. SaO2 93-95 RA. Return to EOB. BP stable, SaO2 95 RA. Pt felt some better. Got to chair (straight back). C/o clamminess, which she sts might be a hot flash. Also sts she has a tight ball in chest and still feels SOB, some pain in inhalation. RN to get her breathing tx soon then try to walk again. Will f/u. Gave MI book. [phone]  Harriet Masson CES, ACSM

## 2012-01-24 NOTE — Progress Notes (Signed)
UR Completed. Simmons, Dashawn Golda F [phone]  

## 2012-01-24 NOTE — Consult Note (Signed)
Pt smokes 1 ppd and is eager to quit. Wants help with patches. Recommended 21 mg patch x 6 weeks, 14 mg patch x 2 weeks and the 7 mg patch x 2 weeks. Discussed patch use instructions and how to taper. Referred to 1-800 quit now for f/u and support. Discussed oral fixation substitutes, second hand smoke and in home smoking policy. Reviewed and gave pt Written education/contact information.

## 2012-01-24 NOTE — Progress Notes (Addendum)
Name: Heidi [name] MRN: [id number] DOB: [birth year]  LOS: 1  PULMONARY PROGRESS NOTE  History of Present Illness: This is a 56 y/o female with COPD who was admitted to [facility] hospital on 3/26 with chest tightness and shortness of breath.  She underwent an urgent LHC after her cardiac enzymes were elevated and her EKG was worrisome for LAD disease.  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  She notes that prior to her presentation she noted chest tightness and inability to get enough air.  She did not have a change in cough or sputum production. No fevers or chills or leg swelling.  She feels somewhat better after receiving breathing treatments and oxygen today.      Cultures / Sepsis markers: none  Antibiotics:   Tests / Events: 3/26 LHC: normal coronary arteries, NICM EF 40-45%; concentric hypokinesis in the mid ventricle  Subjective: Overnight much better breathing, no cough   Vital Signs:    Filed Vitals:   01/23/12 2024 01/23/12 2100 01/24/12 0500 01/24/12 0803  BP:  97/62 110/69   Pulse:  87 84   Temp:  98.3 F (36.8 C) 98.2 F (36.8 C)   TempSrc:  Oral Oral   Resp:  20 19   Height:      Weight:   111 lb (50.349 kg)   SpO2: 97% 98% 96% 100%  3/26 SpO2 94% on RA, 97% on [facility]  Physical Examination: Gen:  no acute distress. CO thick secretions HEENT: NCAT, PERRL, EOMi, OP clear,  Neck: supple without masses PULM: Wheezing anteriorly, diminished air movement bilaterally CV: RRR, no mgr, no JVD AB: BS+, soft, nontender, no hsm Ext: warm, no edema, no clubbing, no cyanosis Derm: no rash or skin breakdown Neuro: A&Ox4, CN II-XII intact, strength 5/5 in all 4 extremities Psyche: Normal mood and affect  Labs and Imaging:   CBC    Component Value Date/Time   WBC 7.2 01/24/2012 0600   RBC 4.23 01/24/2012 0600   HGB 14.4 01/24/2012 0600   HCT 42.7 01/24/2012 0600   PLT 270 01/24/2012 0600   MCV 100.9* 01/24/2012 0600    MCH 34.0 01/24/2012 0600   MCHC 33.7 01/24/2012 0600   RDW 12.9 01/24/2012 0600   LYMPHSABS 2.0 01/23/2012 0626   MONOABS 0.5 01/23/2012 0626   EOSABS 0.1 01/23/2012 0626   BASOSABS 0.0 01/23/2012 0626   BMET    Component Value Date/Time   NA 139 01/24/2012 0600   K 3.9 01/24/2012 0600   CL 102 01/24/2012 0600   CO2 27 01/24/2012 0600   GLUCOSE 108* 01/24/2012 0600   BUN 8 01/24/2012 0600   CREATININE 0.68 01/24/2012 0600   CALCIUM 9.4 01/24/2012 0600   GFRNONAA >90 01/24/2012 0600   GFRAA >90 01/24/2012 0600    Dg Chest 2 View  01/23/2012  *RADIOLOGY REPORT*  Clinical Data: Shortness of breath; difficulty breathing.  History of smoking.  CHEST - 2 VIEW  Comparison: Chest radiograph performed 05/26/2011  Findings: The lungs are hyperexpanded, with flattening of the hemidiaphragms, compatible with COPD.  Chronic peribronchial thickening is seen.  There is no evidence of focal opacification, pleural effusion or pneumothorax.  Mild biapical scarring is again noted.  The heart is normal in size; the mediastinal contour is within normal limits.  No acute osseous abnormalities are seen.  IMPRESSION: Findings of COPD; no acute cardiopulmonary process seen.  Original Report Authenticated By: Tonia Ghent, M.D.     Assessment and Plan:  56 y/o female with COPD (no PFT's on record) who was admitted on 3/26 to [facility] with chest pain, elevated cardiac enzymes and an abnormal EKG.  Her LHC showed normal coronary arteries but concentric hypokinesis in the left mid ventricle.  The primary service suspects a tako-tsubo varient.  PCCM consulted for dyspnea out of proportion to her cardiac findings.  On my evaluation this evening (3/26) she is feeling better after receiving bronchodilators and steroids earlier today.  The degree of severity of her COPD is uncertain, but she has marked emphysema on CXR.  COPD (06/18/2007)   Assessment: perhaps in a mild flare, minimal wheezing .   Plan:  -pulmicort and brovana while  in hospital, then change back to symbicort (was not getting symbicort even though it was on her med list) -scheduled and prn duoneb -no indication for antibiotics -already received solumedrol 3/26, if no improvement on 3/27 restart systemic steroids. Better 3/27 -needs home O2 eval with RT/PT prior to discharge. Will most likely need o2 -needs PFTs, but would wait one month after this hospitalization considering NSTEMI/acute cardiac issue Hyacinth Meeker, Crapo et al. General Considerations for Lung Testing, Eur Respir J 2005; 26: 153-161) -we will make arrangements for an outpatient pulmonary evaluation prior to discharge  Chest pain (01/23/2012)   Assessment: ddx tako-tsubo vs. pe vs. chest soreness from cough? LHC consistent with tako-tsubo   Plan:  -per cardiology  TOBACCO ABUSE (04/06/2009)   Assessment:    Plan:  -counseled to quit at length -tobacco cessation counselor consult placed   Follow up with Dr. Sherene Sires for 02/26/12 10 am. Placed in DC instructions under follow up. SM  Brett Canales Minor ACNP [facility] PCCM Pager [phone] till 3 pm If no answer page [phone] 01/24/2012, 10:14 AM  Pt independently  seen and examined and available cxr's reviewed and I agree with above findings/ imp/ plan   Sandrea Hughs, MD Pulmonary and Critical Care Medicine [facility] Cell [phone]

## 2012-01-24 NOTE — Progress Notes (Signed)
Subjective:  No further chest pain. Cath showed clean coronaries and Takotsubo. 2D echo to be done today EKG shows widespread T wave inversion.  Objective:  Vital Signs in the last 24 hours: Temp:  [97.7 F (36.5 C)-98.3 F (36.8 C)] 98.2 F (36.8 C) (03/27 0500) Pulse Rate:  [77-109] 84  (03/27 0500) Resp:  [18-21] 19  (03/27 0500) BP: (97-110)/(58-71) 110/69 mmHg (03/27 0500) SpO2:  [96 %-100 %] 100 % (03/27 0803) Weight:  [50.349 kg (111 lb)] 50.349 kg (111 lb) (03/27 0500)  Intake/Output from previous day: 03/26 0701 - 03/27 0700 In: -  Out: 950 [Urine:950] Intake/Output from this shift:       . arformoterol  15 mcg Nebulization BID  . aspirin EC  81 mg Oral Daily  . atorvastatin  40 mg Oral q1800  . budesonide  0.25 mg Nebulization BID  . citalopram  40 mg Oral Daily  . heparin      . heparin  3,000 Units Intravenous Once  . heparin  5,000 Units Subcutaneous Q8H  . lidocaine      . midazolam      . nitroGLYCERIN      . sodium chloride  3 mL Intravenous Q12H  . DISCONTD: budesonide-formoterol  2 puff Inhalation BID      . sodium chloride Stopped (01/23/12 1950)  . DISCONTD: heparin 12 Units/kg/hr (01/23/12 0910)  . DISCONTD: nitroGLYCERIN 5 mcg/min (01/23/12 1315)    Physical Exam: The patient appears to be in no distress.  Head and neck exam reveals that the pupils are equal and reactive.  The extraocular movements are full.  There is no scleral icterus.  Mouth and pharynx are benign.  No lymphadenopathy.  No carotid bruits.  The jugular venous pressure is normal.  Thyroid is not enlarged or tender.  Chest reveals diffuse wheezes and rhonchi  Heart reveals no abnormal lift or heave.  First and second heart sounds are normal.  There is no murmur gallop rub or click.  The abdomen is soft and nontender.  Bowel sounds are normoactive.  There is no hepatosplenomegaly or mass.  There are no abdominal bruits. Groin okay.  Extremities reveal no phlebitis or  edema.  Pedal pulses are good.  There is no cyanosis or clubbing.  Neurologic exam is normal strength and no lateralizing weakness.  No sensory deficits.  Integument reveals no rash  Lab Results:  Basename 01/24/12 0600 01/23/12 1815  WBC 7.2 5.6  HGB 14.4 13.9  PLT 270 246    Basename 01/24/12 0600 01/23/12 1815 01/23/12 0626  NA 139 -- 136  K 3.9 -- 3.7  CL 102 -- 101  CO2 27 -- 25  GLUCOSE 108* -- 124*  BUN 8 -- 10  CREATININE 0.68 0.64 --    Basename 01/24/12 0045 01/23/12 1815  TROPONINI 0.47* 0.58*   Hepatic Function Panel  Basename 01/23/12 1350  PROT 6.8  ALBUMIN 3.8  AST 30  ALT 22  ALKPHOS 88  BILITOT 0.4  BILIDIR 0.1  IBILI 0.3    Basename 01/24/12 0600  CHOL 134   No results found for this basename: PROTIME in the last 72 hours  Imaging: Dg Chest 2 View  01/23/2012  *RADIOLOGY REPORT*  Clinical Data: Shortness of breath; difficulty breathing.  History of smoking.  CHEST - 2 VIEW  Comparison: Chest radiograph performed 05/26/2011  Findings: The lungs are hyperexpanded, with flattening of the hemidiaphragms, compatible with COPD.  Chronic peribronchial thickening is seen.  There  is no evidence of focal opacification, pleural effusion or pneumothorax.  Mild biapical scarring is again noted.  The heart is normal in size; the mediastinal contour is within normal limits.  No acute osseous abnormalities are seen.  IMPRESSION: Findings of COPD; no acute cardiopulmonary process seen.  Original Report Authenticated By: Tonia Ghent, M.D.    Cardiac Studies:  Assessment/Plan:   NSTEMI secondary to Takotsubo  Severe COPD  Plan:  Mobilize today. 2D echo today.   LOS: 1 day    Cassell Clement 01/24/2012, 8:55 AM

## 2012-01-24 NOTE — Progress Notes (Signed)
  Echocardiogram 2D Echocardiogram has been performed.  Heidi [name] A 01/24/2012, 11:34 AM

## 2012-01-25 ENCOUNTER — Other Ambulatory Visit: Payer: Self-pay

## 2012-01-25 ENCOUNTER — Encounter (HOSPITAL_COMMUNITY): Payer: Self-pay | Admitting: General Practice

## 2012-01-25 MED ORDER — PNEUMOCOCCAL VAC POLYVALENT 25 MCG/0.5ML IJ INJ
0.5000 mL | INJECTION | Freq: Once | INTRAMUSCULAR | Status: AC
  Administered 2012-01-25: 0.5 mL via INTRAMUSCULAR
  Filled 2012-01-25: qty 0.5

## 2012-01-25 MED ORDER — LOSARTAN POTASSIUM 25 MG PO TABS
25.0000 mg | ORAL_TABLET | Freq: Every day | ORAL | Status: DC
  Administered 2012-01-25: 25 mg via ORAL
  Filled 2012-01-25 (×2): qty 1

## 2012-01-25 MED ORDER — LOSARTAN POTASSIUM 25 MG PO TABS: 25.0000 mg | ORAL_TABLET | Freq: Every day | ORAL | Status: DC

## 2012-01-25 MED ORDER — ASPIRIN 81 MG PO TBEC: 81.0000 mg | DELAYED_RELEASE_TABLET | Freq: Every day | ORAL | Status: AC

## 2012-01-25 NOTE — Discharge Summary (Signed)
See progress notes Heidi [name]  

## 2012-01-25 NOTE — Discharge Summary (Signed)
Discharge Summary   Patient ID: Heidi [name] MRN: [id number], DOB/AGE: 07/28/1956 55 y.o. Admit date: 01/23/2012 D/C date:     01/25/2012   Primary Discharge Diagnoses:  1. NSTEMI secondary to possible Takotsubo cardiomyopathy - EF 30-35% by echo this admission - no evidence for obstructive CAD by cath 01/23/12 2. Acute respiratory failure prior to admission 3. Hypotension, limiting medication titration 4. Severe COPD  Hospital Course: 56 y/o F with hx of COPD but no prior cardiac history presented with 2 episodes of worsening dyspnea/hyperventilation associated with chest discomfort. The morning prior to admission she had called EMS and was treated with multiple breathing treatments with eventual relief, and declined going to the hospital. Symptoms returned day of admission waking her from sleep causing her to call 911. She was taken to [facility] where her first troponin was noted to be 1.61 in the setting of new lateral TWI on EKG. CXR showed findings consistent with COPD with no acute cardiopulmonary findings. She was treated with IV heparin and had relief of pain. She still however at time of cardiology consultation did note an occasional tightness. EKG demonstrated evolving T-wave changes concerning for anterior lesion. Urgent catheterization was recommended and she was taken to the lab demonstrating no significant CAD but unusual WMA with concentric hypokinesis in the mid ventricle felt most likely consistent with Takotsubo cardiomyopathy and EF depressed ~45% range. 2D echo revealed EF to be even lower at 30-35%. Low dose Cozaar was added. Her BP was often borderline though this is chronic for her, and was stable after her AM dose today (96 systolic, was 94 earlier). ACEI/BB were avoided with COPD. The plan is to repeat echo in 8-12 weeks in hopes that her LV function will have normalized. Her lung function was also felt to play a major role in her deconditioning and she was seen  by pulmonary, started on nebulizers and O2. There was no evidence of infection. She improved with scheduled pulmonary hygiene and did not require supplemental O2 upon walking with cardiac rehab today. Tobacco cessation was reviewed at length with the patient. Pulmonary will follow her as an outpatient and will plan for PFTs in approximately a month considering cardiac issue this admission. Today she is feeling well. From a cardiac standpoint, The patient was seen and examined today and felt stable for discharge by Dr.  Jens Som.  Discharge Vitals: Blood pressure 96/54, pulse 72, temperature 98.2 F (36.8 C), temperature source Oral, resp. rate 20, height 5\' 6"  (1.676 m), weight 111 lb 3.2 oz (50.44 kg), SpO2 98.00%.  Labs: Lab Results  Component Value Date   WBC 7.2 01/24/2012   HGB 14.4 01/24/2012   HCT 42.7 01/24/2012   MCV 100.9* 01/24/2012   PLT 270 01/24/2012     Lab 01/24/12 0600 01/23/12 1350  NA 139 --  K 3.9 --  CL 102 --  CO2 27 --  BUN 8 --  CREATININE 0.68 --  CALCIUM 9.4 --  PROT -- 6.8  BILITOT -- 0.4  ALKPHOS -- 88  ALT -- 22  AST -- 30  GLUCOSE 108* --    Basename 01/24/12 0045 01/23/12 1815 01/23/12 1350 01/23/12 0800 01/23/12 0626  CKTOTAL 164 204* 226* 258* --  CKMB 5.6* 7.4* 9.3* 11.0* --  TROPONINI 0.47* 0.58* 0.99* -- 1.61*   Lab Results  Component Value Date   CHOL 134 01/24/2012   HDL 80 01/24/2012   LDLCALC 41 01/24/2012   TRIG 65 01/24/2012  Diagnostic Studies/Procedures   1. Chest 2 View 01/23/2012  *RADIOLOGY REPORT*  Clinical Data: Shortness of breath; difficulty breathing.  History of smoking.  CHEST - 2 VIEW  Comparison: Chest radiograph performed 05/26/2011  Findings: The lungs are hyperexpanded, with flattening of the hemidiaphragms, compatible with COPD.  Chronic peribronchial thickening is seen.  There is no evidence of focal opacification, pleural effusion or pneumothorax.  Mild biapical scarring is again noted.  The heart is normal in size; the  mediastinal contour is within normal limits.  No acute osseous abnormalities are seen.  IMPRESSION: Findings of COPD; no acute cardiopulmonary process seen.  Original Report Authenticated By: Tonia Ghent, M.D.   2. 2D Echo Study Conclusions 01/24/12 - Left ventricle: The cavity size was normal. Wall thickness was  normal. Systolic function was moderately to severely reduced. The estimated ejection fraction was in the range of 30% to 35%. Diffuse hypokinesis seen. Do not see the apical ballooning picture of classic Takotsubo cardiomyopathy. - Aortic valve: There was no stenosis. Trivial regurgitation. - Mitral valve: No significant regurgitation. - Right ventricle: The cavity size was normal. Systolic function was normal. - Pulmonary arteries: No complete TR doppler jet so unable to estimate PA systolic pressure. - Inferior vena cava: The vessel was normal in size; the respirophasic diameter changes were in the normal range (= 50%); findings are consistent with normal central venous pressure. Impressions: - Normal LV size with moderate to severe systolic dysfunction. EF 30-35% with diffuse hypokinesis. I do not see the apical ballooning appearance of classic Takotsubo.Normal RV size and systolic function.   Discharge Medications   Medication List  As of 01/25/2012  2:27 PM   TAKE these medications         albuterol 108 (90 BASE) MCG/ACT inhaler   Commonly known as: PROVENTIL HFA;VENTOLIN HFA   Inhale 2 puffs into the lungs every 6 (six) hours as needed. Shortness of breath      albuterol (2.5 MG/3ML) 0.083% nebulizer solution   Commonly known as: PROVENTIL   Take 3 mLs (2.5 mg total) by nebulization every 4 (four) hours as needed for wheezing or shortness of breath.      aspirin 81 MG EC tablet   Take 1 tablet (81 mg total) by mouth daily.      budesonide-formoterol 160-4.5 MCG/ACT inhaler   Commonly known as: SYMBICORT   Inhale 2 puffs into the lungs 2 (two) times daily.       citalopram 40 MG tablet   Commonly known as: CELEXA   Take 40 mg by mouth daily.      ipratropium 17 MCG/ACT inhaler   Commonly known as: ATROVENT HFA   Inhale 2 puffs into the lungs every 6 (six) hours.      losartan 25 MG tablet   Commonly known as: COZAAR   Take 1 tablet (25 mg total) by mouth daily.            Disposition   The patient will be discharged in stable condition to home. Discharge Orders    Future Appointments: Provider: Department: Dept Phone: Center:   02/19/2012 9:00 AM Dyann Kief, PA [facility] [phone] [facility]   02/26/2012 10:00 AM Nyoka Cowden, MD [facility] [phone] None     Future Orders Please Complete By Expires   Diet - low sodium heart healthy      Increase activity slowly      Comments:   No driving for 4 days. No lifting over  5 lbs for 1 week. No sexual activity for 1 week. You may return to work on 02/01/12. Keep procedure site clean & dry. If you notice increased pain, swelling, bleeding or pus, call/return!  You may shower, but no soaking baths/hot tubs/pools for 1 week.   Discharge instructions      Comments:   It may be a good idea to get a blood pressure cuff for use at home, or check your blood pressure at the drug store periodically. If you find your blood pressure is lower than 90 on the top number or you are feeling poorly, call your doctor.  Also, we continue to encourage you to STOP SMOKING!     Follow-up Information    Follow up with Sandrea Hughs, MD on 02/26/2012. (Dr. Sherene Sires at [facility] at 10:00 am)    Contact information:   [address] [place] [address] [phone]       Follow up with Olga Millers, MD. (02/19/12 at 9am)    Contact information:   [address] [place], [address] [place] [address] [phone]            Duration of Discharge Encounter: Greater than 30 minutes including physician and PA time.  Signed, Ronie Spies PA-C 01/25/2012, 2:27  PM

## 2012-01-25 NOTE — Progress Notes (Signed)
Pt and family given discharge instructions. Pt verbalized understanding. Pt was providedwith education on new medication and how/when to take it. Pt verbalized understanding. All pt questions answered. Pt IV removed with tip intact. VSS. No complaints at this time. Pt appears ready for discharge. Pt left unit in wheelchair for home. Ramond Craver, RN

## 2012-01-25 NOTE — Progress Notes (Signed)
   Subjective:  No further chest pain; denies dyspnea  Objective:  Vital Signs in the last 24 hours: Temp:  [97.9 F (36.6 C)-98.7 F (37.1 C)] 98.2 F (36.8 C) (03/28 0500) Pulse Rate:  [63-88] 63  (03/28 0500) Resp:  [18-21] 20  (03/28 0500) BP: (94)/(51-63) 94/54 mmHg (03/28 0500) SpO2:  [94 %-100 %] 98 % (03/28 0500) Weight:  [111 lb 3.2 oz (50.44 kg)] 111 lb 3.2 oz (50.44 kg) (03/28 0500)  Intake/Output from previous day:   Intake/Output from this shift:       . albuterol  2.5 mg Nebulization Q6H  . arformoterol  15 mcg Nebulization BID  . aspirin EC  81 mg Oral Daily  . atorvastatin  40 mg Oral q1800  . budesonide  0.25 mg Nebulization BID  . citalopram  40 mg Oral Daily  . heparin  5,000 Units Subcutaneous Q8H  . ipratropium  0.5 mg Nebulization Q6H  . sodium chloride  3 mL Intravenous Q12H      Physical Exam: WD, frail in NAD  Chest reveals diminished BS and diffuse wheezes and rhonchi  Heart reveals RRR and no murmur gallop rub  The abdomen is soft and nontender. There is no hepatosplenomegaly or mass.  There are no abdominal bruits. Groin without hematoma or bruit  Extremities reveal no edema  Neurologic grossly nonfocal   Lab Results:  Basename 01/24/12 0600 01/23/12 1815  WBC 7.2 5.6  HGB 14.4 13.9  PLT 270 246    Basename 01/24/12 0600 01/23/12 1815 01/23/12 0626  NA 139 -- 136  K 3.9 -- 3.7  CL 102 -- 101  CO2 27 -- 25  GLUCOSE 108* -- 124*  BUN 8 -- 10  CREATININE 0.68 0.64 --    Basename 01/24/12 0045 01/23/12 1815  TROPONINI 0.47* 0.58*   Hepatic Function Panel  Basename 01/23/12 1350  PROT 6.8  ALBUMIN 3.8  AST 30  ALT 22  ALKPHOS 88  BILITOT 0.4  BILIDIR 0.1  IBILI 0.3    Basename 01/24/12 0600  CHOL 134      Assessment/Plan:   NSTEMI secondary to Takotsubo - Echo reveals EF 30-35; will try low dose cozaar (25 mg daily); BP borderline and may not tolerate; will avoid ACEI and Beta blocker secondary to COPD.  Will plan repeat echo in 8-12 weeks; hopefully LV function will have normalized.  Severe COPD - continue pulmonary toilet; will need close fu with pulmonary.  DC today if BP stable; fu with me 2-4 weeks Fu pulmonary 2-4 weeks >30 min PA and physician time D2  LOS: 2 days    Heidi [name] 01/25/2012, 7:53 AM

## 2012-01-25 NOTE — Progress Notes (Signed)
CARDIAC REHAB PHASE I   PRE:  Rate/Rhythm: 76 SR    BP: sitting 90/60    SaO2: 96 RA  MODE:  Ambulation: 360 ft   POST:  Rate/Rhythm: 80    BP: sitting 100/70     SaO2: 96 RA  Much improved breathing today on better breathing tx regimen. SaO2 good, no DOE. Continued blurred vision but with questioning vision is blurring in room at rest. with objects further away. Sts she needs to get her eyes checked. Ed completed. Pt motivated to quit smoking. Not interested in CRPII at this time. [id number]  Heidi [name] CES, ACSM

## 2012-01-29 DIAGNOSIS — I5181 Takotsubo syndrome: Secondary | ICD-10-CM

## 2012-01-29 HISTORY — DX: Takotsubo syndrome: I51.81

## 2012-01-31 ENCOUNTER — Inpatient Hospital Stay (HOSPITAL_COMMUNITY)
Admission: EM | Admit: 2012-01-31 | Discharge: 2012-02-02 | DRG: 191 | Disposition: A | Discharge status: Home / Self Care | Payer: Self-pay | Attending: Internal Medicine | Admitting: Internal Medicine

## 2012-01-31 ENCOUNTER — Other Ambulatory Visit: Payer: Self-pay

## 2012-01-31 ENCOUNTER — Emergency Department (HOSPITAL_COMMUNITY): Payer: Self-pay

## 2012-01-31 ENCOUNTER — Encounter (HOSPITAL_COMMUNITY): Payer: Self-pay | Admitting: *Deleted

## 2012-01-31 DIAGNOSIS — F17200 Nicotine dependence, unspecified, uncomplicated: Secondary | ICD-10-CM | POA: Diagnosis present

## 2012-01-31 DIAGNOSIS — J449 Chronic obstructive pulmonary disease, unspecified: Secondary | ICD-10-CM

## 2012-01-31 DIAGNOSIS — I5181 Takotsubo syndrome: Secondary | ICD-10-CM | POA: Diagnosis present

## 2012-01-31 DIAGNOSIS — J4489 Other specified chronic obstructive pulmonary disease: Principal | ICD-10-CM | POA: Diagnosis present

## 2012-01-31 DIAGNOSIS — J441 Chronic obstructive pulmonary disease with (acute) exacerbation: Secondary | ICD-10-CM | POA: Diagnosis present

## 2012-01-31 DIAGNOSIS — Z87891 Personal history of nicotine dependence: Secondary | ICD-10-CM | POA: Diagnosis present

## 2012-01-31 DIAGNOSIS — I214 Non-ST elevation (NSTEMI) myocardial infarction: Secondary | ICD-10-CM | POA: Diagnosis present

## 2012-01-31 DIAGNOSIS — R0602 Shortness of breath: Secondary | ICD-10-CM | POA: Diagnosis present

## 2012-01-31 DIAGNOSIS — E871 Hypo-osmolality and hyponatremia: Secondary | ICD-10-CM | POA: Diagnosis present

## 2012-01-31 LAB — CBC
MCH: 34.2 pg — ABNORMAL HIGH (ref 26.0–34.0)
MCHC: 34.3 g/dL (ref 30.0–36.0)
MCV: 99.8 fL (ref 78.0–100.0)
Platelets: 201 10*3/uL (ref 150–400)
RDW: 12.6 % (ref 11.5–15.5)

## 2012-01-31 LAB — BASIC METABOLIC PANEL
BUN: 8 mg/dL (ref 6–23)
CO2: 28 mEq/L (ref 19–32)
Calcium: 9.6 mg/dL (ref 8.4–10.5)
Creatinine, Ser: 0.69 mg/dL (ref 0.50–1.10)
Glucose, Bld: 121 mg/dL — ABNORMAL HIGH (ref 70–99)

## 2012-01-31 MED ORDER — ONDANSETRON HCL 4 MG/2ML IJ SOLN: 4.0000 mg | Freq: Three times a day (TID) | INTRAMUSCULAR | Status: AC | PRN

## 2012-01-31 MED ORDER — ONDANSETRON HCL 4 MG/2ML IJ SOLN: 4.0000 mg | Freq: Four times a day (QID) | INTRAMUSCULAR | Status: DC | PRN

## 2012-01-31 MED ORDER — ACETAMINOPHEN 650 MG RE SUPP: 650.0000 mg | Freq: Four times a day (QID) | RECTAL | Status: DC | PRN

## 2012-01-31 MED ORDER — ASPIRIN EC 81 MG PO TBEC
81.0000 mg | DELAYED_RELEASE_TABLET | Freq: Every day | ORAL | Status: DC
  Administered 2012-01-31 – 2012-02-02 (×3): 81 mg via ORAL
  Filled 2012-01-31 (×4): qty 1

## 2012-01-31 MED ORDER — ACETAMINOPHEN 325 MG PO TABS: 650.0000 mg | ORAL_TABLET | Freq: Once | ORAL | Status: DC

## 2012-01-31 MED ORDER — CITALOPRAM HYDROBROMIDE 40 MG PO TABS
40.0000 mg | ORAL_TABLET | Freq: Every day | ORAL | Status: DC
  Administered 2012-02-01 – 2012-02-02 (×2): 40 mg via ORAL
  Filled 2012-01-31 (×3): qty 1

## 2012-01-31 MED ORDER — SODIUM CHLORIDE 0.9 % IJ SOLN
3.0000 mL | Freq: Two times a day (BID) | INTRAMUSCULAR | Status: DC
  Administered 2012-01-31 – 2012-02-02 (×4): 3 mL via INTRAVENOUS

## 2012-01-31 MED ORDER — IPRATROPIUM BROMIDE 0.02 % IN SOLN
1.0000 mg | Freq: Once | RESPIRATORY_TRACT | Status: AC
  Administered 2012-01-31: 0.5 mg via RESPIRATORY_TRACT
  Filled 2012-01-31: qty 2.5

## 2012-01-31 MED ORDER — SODIUM CHLORIDE 0.9 % IJ SOLN: 3.0000 mL | INTRAMUSCULAR | Status: DC | PRN

## 2012-01-31 MED ORDER — GUAIFENESIN-DM 100-10 MG/5ML PO SYRP
5.0000 mL | ORAL_SOLUTION | ORAL | Status: DC | PRN
  Administered 2012-02-02: 5 mL via ORAL
  Filled 2012-01-31: qty 10

## 2012-01-31 MED ORDER — ONDANSETRON HCL 4 MG PO TABS: 4.0000 mg | ORAL_TABLET | Freq: Four times a day (QID) | ORAL | Status: DC | PRN

## 2012-01-31 MED ORDER — LOSARTAN POTASSIUM 25 MG PO TABS
25.0000 mg | ORAL_TABLET | Freq: Every day | ORAL | Status: DC
  Administered 2012-02-01 – 2012-02-02 (×2): 25 mg via ORAL
  Filled 2012-01-31 (×3): qty 1

## 2012-01-31 MED ORDER — ACETAMINOPHEN 500 MG PO TABS
1000.0000 mg | ORAL_TABLET | Freq: Once | ORAL | Status: AC
  Administered 2012-01-31: 1000 mg via ORAL
  Filled 2012-01-31: qty 2

## 2012-01-31 MED ORDER — ALBUTEROL SULFATE (5 MG/ML) 0.5% IN NEBU
5.0000 mg | INHALATION_SOLUTION | RESPIRATORY_TRACT | Status: DC | PRN
  Filled 2012-01-31: qty 0.5

## 2012-01-31 MED ORDER — ALBUTEROL SULFATE (5 MG/ML) 0.5% IN NEBU
2.5000 mg | INHALATION_SOLUTION | RESPIRATORY_TRACT | Status: DC | PRN
  Administered 2012-01-31 – 2012-02-01 (×2): 2.5 mg via RESPIRATORY_TRACT

## 2012-01-31 MED ORDER — METHYLPREDNISOLONE SODIUM SUCC 125 MG IJ SOLR
80.0000 mg | Freq: Two times a day (BID) | INTRAMUSCULAR | Status: DC
  Administered 2012-01-31 – 2012-02-01 (×3): 80 mg via INTRAVENOUS
  Filled 2012-01-31 (×6): qty 1.28

## 2012-01-31 MED ORDER — SODIUM CHLORIDE 0.9 % IV SOLN: 250.0000 mL | INTRAVENOUS | Status: DC | PRN

## 2012-01-31 MED ORDER — OXYCODONE HCL 5 MG PO TABS: 5.0000 mg | ORAL_TABLET | ORAL | Status: DC | PRN

## 2012-01-31 MED ORDER — ENOXAPARIN SODIUM 40 MG/0.4ML ~~LOC~~ SOLN
40.0000 mg | SUBCUTANEOUS | Status: DC
  Administered 2012-01-31 – 2012-02-02 (×2): 40 mg via SUBCUTANEOUS
  Filled 2012-01-31 (×4): qty 0.4

## 2012-01-31 MED ORDER — IPRATROPIUM BROMIDE 0.02 % IN SOLN
0.5000 mg | RESPIRATORY_TRACT | Status: DC
  Administered 2012-01-31 – 2012-02-02 (×9): 0.5 mg via RESPIRATORY_TRACT
  Filled 2012-01-31 (×9): qty 2.5

## 2012-01-31 MED ORDER — DIPHENHYDRAMINE HCL 25 MG PO CAPS
25.0000 mg | ORAL_CAPSULE | Freq: Every evening | ORAL | Status: DC | PRN
  Administered 2012-01-31 – 2012-02-02 (×2): 25 mg via ORAL
  Filled 2012-01-31 (×2): qty 1

## 2012-01-31 MED ORDER — IPRATROPIUM BROMIDE 0.02 % IN SOLN
0.5000 mg | Freq: Four times a day (QID) | RESPIRATORY_TRACT | Status: DC
  Administered 2012-01-31: 0.5 mg via RESPIRATORY_TRACT
  Filled 2012-01-31: qty 2.5

## 2012-01-31 MED ORDER — ACETAMINOPHEN 325 MG PO TABS: 650.0000 mg | ORAL_TABLET | Freq: Four times a day (QID) | ORAL | Status: DC | PRN

## 2012-01-31 MED ORDER — PREDNISONE 20 MG PO TABS
60.0000 mg | ORAL_TABLET | Freq: Once | ORAL | Status: AC
Start: 2012-01-31 — End: 2012-01-31
  Administered 2012-01-31: 60 mg via ORAL
  Filled 2012-01-31: qty 3

## 2012-01-31 MED ORDER — ALBUTEROL SULFATE (5 MG/ML) 0.5% IN NEBU
10.0000 mg | INHALATION_SOLUTION | Freq: Once | RESPIRATORY_TRACT | Status: AC
  Administered 2012-01-31: 10 mg via RESPIRATORY_TRACT

## 2012-01-31 MED ORDER — ALBUTEROL SULFATE (5 MG/ML) 0.5% IN NEBU
2.5000 mg | INHALATION_SOLUTION | Freq: Four times a day (QID) | RESPIRATORY_TRACT | Status: DC
  Administered 2012-01-31: 2.5 mg via RESPIRATORY_TRACT
  Filled 2012-01-31: qty 0.5

## 2012-01-31 MED ORDER — ALBUTEROL SULFATE (5 MG/ML) 0.5% IN NEBU
2.5000 mg | INHALATION_SOLUTION | RESPIRATORY_TRACT | Status: DC
  Administered 2012-01-31 – 2012-02-02 (×9): 2.5 mg via RESPIRATORY_TRACT
  Filled 2012-01-31 (×9): qty 0.5

## 2012-01-31 MED ORDER — MORPHINE SULFATE 2 MG/ML IJ SOLN: 1.0000 mg | INTRAMUSCULAR | Status: DC | PRN

## 2012-01-31 NOTE — ED Provider Notes (Signed)
History     CSN: [id number]  Arrival date & time 01/31/12  1144   First MD Initiated Contact with Patient 01/31/12 1202      Chief Complaint  Patient presents with  . Shortness of Breath    (Consider location/radiation/quality/duration/timing/severity/associated sxs/prior treatment) HPI   Complains of shortness of breath typical of COPD started 2 days ago accompanied by dry cough. Treated with her inhalers, without relief. Admits to chills no fever no other associated symptoms shortness of breath is worse with coughing and worse with activity improved with nothing. Has chest soreness diffuse, worse with coughing no other complaint Past Medical History  Diagnosis Date  . COPD (chronic obstructive pulmonary disease)   . NSTEMI (non-ST elevated myocardial infarction)     12/2011 with normal coronaries possibly secondary to Takotsubo (EF 30-35% by echo), occurred following two episodes of acute respiratory distress  . Hypotension     limiting med titration    Past Surgical History  Procedure Date  . Abdominal hysterectomy   . Vagina rectal repair     after childbirth    Family History  Problem Relation Age of Onset  . Heart attack Mother     deceased from massive MI at age 75  . Liver disease Father     fatty liver; living, age 11  . Stroke Brother     at age 56, living   . Hypertension Brother     living, age 60    History  Substance Use Topics  . Smoking status: Current Everyday Smoker    Types: Cigarettes    Last Attempt to Quit: 01/21/2012  . Smokeless tobacco: Never Used   Comment: smoked 1 ppd X 30 years, has recently cut down to 3 packs per week; has quit smoking entirely X 2 days  . Alcohol Use: Yes     1-2 beers per week   no alcohol no drugs  OB History    Grav Para Term Preterm Abortions TAB SAB Ect Mult Living                  Review of Systems  Constitutional: Positive for chills.  Respiratory: Positive for cough and shortness of breath.   All  other systems reviewed and are negative.    Allergies  Codeine  Home Medications   Current Outpatient Rx  Name Route Sig Dispense Refill  . ALBUTEROL SULFATE HFA 108 (90 BASE) MCG/ACT IN AERS Inhalation Inhale 2 puffs into the lungs every 6 (six) hours as needed. Shortness of breath    . ALBUTEROL SULFATE (2.5 MG/3ML) 0.083% IN NEBU Nebulization Take 3 mLs (2.5 mg total) by nebulization every 4 (four) hours as needed for wheezing or shortness of breath. 75 mL 0  . ASPIRIN 81 MG PO TBEC Oral Take 1 tablet (81 mg total) by mouth daily.    . BUDESONIDE-FORMOTEROL FUMARATE 160-4.5 MCG/ACT IN AERO Inhalation Inhale 2 puffs into the lungs 2 (two) times daily.      Marland Kitchen CITALOPRAM HYDROBROMIDE 40 MG PO TABS Oral Take 40 mg by mouth daily.      . IPRATROPIUM BROMIDE HFA 17 MCG/ACT IN AERS Inhalation Inhale 2 puffs into the lungs every 6 (six) hours.      Marland Kitchen LOSARTAN POTASSIUM 25 MG PO TABS Oral Take 1 tablet (25 mg total) by mouth daily. 30 tablet 6    BP 134/74  Pulse 116  Temp(Src) 98.6 F (37 C) (Oral)  Resp 26  SpO2 100%  Physical  Exam  Nursing note and vitals reviewed. Constitutional:       Cachectic, moderate respiratory distress, speaks in sentences  HENT:  Head: Normocephalic and atraumatic.  Eyes: Conjunctivae are normal. Pupils are equal, round, and reactive to light.  Neck: Neck supple. No tracheal deviation present. No thyromegaly present.  Cardiovascular: Regular rhythm.   No murmur heard.      Tachycardic  Pulmonary/Chest: She is in respiratory distress.       Prolonged expiratory phase with expiratory wheezes, coughing frequently  Abdominal: Soft. Bowel sounds are normal. She exhibits no distension. There is no tenderness.  Musculoskeletal: Normal range of motion. She exhibits no edema and no tenderness.  Neurological: She is alert. Coordination normal.  Skin: Skin is warm and dry. No rash noted.  Psychiatric: She has a normal mood and affect.    ED Course    Procedures (including critical care time) After one hour of continuous nebulizations and prednisone patient is breathing is improved, not at baseline He was notified back 325Pm that he is febrile. Tylenol ORDERED  Labs Reviewed  BASIC METABOLIC PANEL  CBC   No results found. Results for orders placed during the hospital encounter of 01/31/12  BASIC METABOLIC PANEL      Component Value Range   Sodium 129 (*) 135 - 145 (mEq/L)   Potassium 4.3  3.5 - 5.1 (mEq/L)   Chloride 93 (*) 96 - 112 (mEq/L)   CO2 28  19 - 32 (mEq/L)   Glucose, Bld 121 (*) 70 - 99 (mg/dL)   BUN 8  6 - 23 (mg/dL)   Creatinine, Ser 4.54  0.50 - 1.10 (mg/dL)   Calcium 9.6  8.4 - 09.8 (mg/dL)   GFR calc non Af Amer >90  >90 (mL/min)   GFR calc Af Amer >90  >90 (mL/min)  CBC      Component Value Range   WBC 7.1  4.0 - 10.5 (K/uL)   RBC 4.30  3.87 - 5.11 (MIL/uL)   Hemoglobin 14.7  12.0 - 15.0 (g/dL)   HCT 11.9  14.7 - 82.9 (%)   MCV 99.8  78.0 - 100.0 (fL)   MCH 34.2 (*) 26.0 - 34.0 (pg)   MCHC 34.3  30.0 - 36.0 (g/dL)   RDW 56.2  13.0 - 86.5 (%)   Platelets 201  150 - 400 (K/uL)   Dg Chest 2 View  01/23/2012  *RADIOLOGY REPORT*  Clinical Data: Shortness of breath; difficulty breathing.  History of smoking.  CHEST - 2 VIEW  Comparison: Chest radiograph performed 05/26/2011  Findings: The lungs are hyperexpanded, with flattening of the hemidiaphragms, compatible with COPD.  Chronic peribronchial thickening is seen.  There is no evidence of focal opacification, pleural effusion or pneumothorax.  Mild biapical scarring is again noted.  The heart is normal in size; the mediastinal contour is within normal limits.  No acute osseous abnormalities are seen.  IMPRESSION: Findings of COPD; no acute cardiopulmonary process seen.  Original Report Authenticated By: Tonia Ghent, M.D.   Dg Chest Port 1 View  01/31/2012  *RADIOLOGY REPORT*  Clinical Data: Shortness of breath.  PORTABLE CHEST - 1 VIEW  Comparison: 01/23/2012,   05/26/2011 and 01/31/2010.  Findings: Biapical pleural thickening without associated bony destruction unchanged.  Chronic lung changes stable.  Pulmonary vascular prominence unchanged.  No segmental infiltrate or pneumothorax.  Heart size within normal limits.  IMPRESSION: No acute abnormality.  Please see above.  Original Report Authenticated By: Fuller Canada, M.D.  No diagnosis found.  Spoke with Dr.kRISHAN WHO WILL ARRANGE FOR 23 HOUR OBSERVATION  MDM  PLAN ADMIT OXYGEN ,NEBS, TELEMRTRY ,dR KRISHNAN TO CHECK bnp  Diagnoses exacerbation of COPD       Doug Sou, MD 01/31/12 1530

## 2012-01-31 NOTE — H&P (Signed)
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 Patient has a history of severe uncontrolled COPD which was felt to be a contributing factor. She was discharged home, but for the last 2 days had progressively worsening shortness of breath and severe cough-nonproductive which got to the point where she could feel it she was barely breathing. She came to the emergency room for further evaluation  In the emergency room, patient was noted to have a normal chest x-ray with nothing acute. Vital signs were stable other than oxygen desaturations noted on room air. She required 3 L to keep her oxygen above 90%. No signs of any acute infection were noted. Hospitalists were called for treatment and admission  Review of Systems:  When I saw the patient in the emergency room, she was doing okay. She denies any headaches, vision changes, dysphasia, chest pain, palpitations. She did complain of shortness of breath plus wheezing and nonproductive cough. When she was coughing, but then she most complains of pain on the upper aspects of both her abdomen where her diaphragm is. She denied any actual abdominal pain, hematuria, dysuria, constipation, diarrhea, focal extremity numbness weakness or pain. No nausea or vomiting. Review of systems otherwise negative.  Past Medical History: Past Medical History  Diagnosis Date  . COPD (chronic obstructive pulmonary disease)   . NSTEMI (non-ST elevated myocardial infarction)     12/2011 with normal coronaries possibly secondary to Takotsubo (EF 30-35% by echo), occurred following two episodes of acute respiratory distress  . Hypotension     limiting med titration   Past Surgical History  Procedure  Date  . Abdominal hysterectomy   . Vagina rectal repair     after childbirth    Medications: Prior to Admission medications   Medication Sig Start Date End Date Taking? Authorizing Provider  albuterol (PROVENTIL HFA;VENTOLIN HFA) 108 (90 BASE) MCG/ACT inhaler Inhale 2 puffs into the lungs every 6 (six) hours as needed. Shortness of breath   Yes Historical Provider, MD  albuterol (PROVENTIL) (2.5 MG/3ML) 0.083% nebulizer solution Take 3 mLs (2.5 mg total) by nebulization every 4 (four) hours as needed for wheezing or shortness of breath. 10/16/11 10/15/12 Yes Dawn Vidal Schwalbe, PA  aspirin EC 81 MG EC tablet Take 1 tablet (81 mg total) by mouth daily. 01/25/12 01/24/13 Yes Dayna N Dunn, PA  budesonide-formoterol (SYMBICORT) 160-4.5 MCG/ACT inhaler Inhale 2 puffs into the lungs 2 (two) times daily.     Yes Historical Provider, MD  citalopram (CELEXA) 40 MG tablet Take 40 mg by mouth daily.     Yes Historical Provider, MD  ipratropium (ATROVENT HFA) 17 MCG/ACT inhaler Inhale 2 puffs into the lungs every 6 (six) hours.     Yes Historical Provider, MD  losartan (COZAAR) 25 MG tablet Take 1 tablet (25 mg total) by mouth daily. 01/25/12 01/24/13 Yes Laurann Montana, PA    Allergies:   Allergies  Allergen Reactions  . Codeine Nausea Only    Social History:  reports that she quit smoking 10 days ago. Her smoking use included Cigarettes. She quit after 30 years of use. She has never used smokeless tobacco. She reports that she drinks alcohol. She reports that she does not use illicit drugs. The patient  is normally at baseline able to participate in activities of daily living although she was slow to increase given her recent non-STEMI. Patient lives at home with family..  Family History: Family History  Problem Relation Age of Onset  . Heart attack Mother     deceased from massive MI at age 84  . Liver disease Father     fatty liver; living, age 41  . Stroke Brother     at age 22, living   .  Hypertension Brother     living, age 48    Physical Exam: Filed Vitals:   01/31/12 1223 01/31/12 1230 01/31/12 1507 01/31/12 1640  BP:  139/73 133/61 101/54  Pulse:  103 110 90  Temp:   101.8 F (38.8 C) 98.3 F (36.8 C)  TempSrc:   Rectal Oral  Resp:  23 24   SpO2: 95% 98% 93%    General: Alert and oriented x3, moderate distress secondary to coughing. Looks older than stated age. Fatigued. HEENT: Normocephalic, atraumatic, mucous membranes are slightly dry Cardiovascular: Regular rate and rhythm, S1-S2, borderline tachycardia Lungs: Decreased breath sounds throughout Abdomen: Soft, nontender, nondistended, positive bowel sounds Extremities: No clubbing or cyanosis or edema   Labs on Admission:   [facility] 01/31/12 1215  NA 129*  K 4.3  CL 93*  CO2 28  GLUCOSE 121*  BUN 8  CREATININE 0.69  CALCIUM 9.6  MG --  PHOS --    Basename 01/31/12 1215  WBC 7.1  NEUTROABS --  HGB 14.7  HCT 42.9  MCV 99.8  PLT 201    Radiological Exams on Admission: Dg Chest Port 1 View 01/31/2012   PORTABLE CHEST - 1 VIEW  Comparison: 01/23/2012,  05/26/2011 and 01/31/2010.  Findings: Biapical pleural thickening without associated bony destruction unchanged.  Chronic lung changes stable.  Pulmonary vascular prominence unchanged.  No segmental infiltrate or pneumothorax.  Heart size within normal limits.  IMPRESSION: No acute abnormality.   Assessment/Plan Present on Admission:  .TOBACCO ABUSE: The patient has not resumed smoking since her previous hospitalization.  Marland KitchenCOPD: Principal problem. Have started the patient on IV Solu-Medrol plus nebulizers scheduled and when necessary plus oxygen. If she is no improvement by morning, will consult pulmonary. No signs of anything infectious at this time.  Marland KitchenHx of MI < 8 weeks: Stable. No evidence of any cardiac irregularities. Patient is chest pain-free.  .Takotsubo cardiomyopathy: BNP relative are normal.  After discussion with the patient, she  is to be a full code.  We will respect these wishes.  I anticipate her length of stay to be hopefully 1-3 days based on findings and lab work limited to just COPD, unless something should change.  Time spent on this patient including examination and decision-making process: 45 minutes.  Hollice Espy [phone] 01/31/2012, 5:42 PM

## 2012-01-31 NOTE — ED Notes (Signed)
Pt states she becomes sob with activity. Pt states she is unable to perform her adl's due to sob. Pt state she is having a dry cough after coughing pt c/o chest soreness

## 2012-02-01 DIAGNOSIS — J441 Chronic obstructive pulmonary disease with (acute) exacerbation: Secondary | ICD-10-CM

## 2012-02-01 LAB — BASIC METABOLIC PANEL
BUN: 10 mg/dL (ref 6–23)
Chloride: 94 mEq/L — ABNORMAL LOW (ref 96–112)
Creatinine, Ser: 0.6 mg/dL (ref 0.50–1.10)
Glucose, Bld: 200 mg/dL — ABNORMAL HIGH (ref 70–99)
Potassium: 3.8 mEq/L (ref 3.5–5.1)

## 2012-02-01 LAB — BLOOD GAS, ARTERIAL
Bicarbonate: 24.4 mEq/L — ABNORMAL HIGH (ref 20.0–24.0)
TCO2: 21.3 mmol/L (ref 0–100)
pCO2 arterial: 39.4 mmHg (ref 35.0–45.0)
pH, Arterial: 7.409 — ABNORMAL HIGH (ref 7.350–7.400)

## 2012-02-01 MED ORDER — PANTOPRAZOLE SODIUM 40 MG PO TBEC
40.0000 mg | DELAYED_RELEASE_TABLET | Freq: Two times a day (BID) | ORAL | Status: DC
  Administered 2012-02-01 – 2012-02-02 (×3): 40 mg via ORAL
  Filled 2012-02-01 (×5): qty 1

## 2012-02-01 MED ORDER — BUDESONIDE-FORMOTEROL FUMARATE 160-4.5 MCG/ACT IN AERO
2.0000 | INHALATION_SPRAY | Freq: Two times a day (BID) | RESPIRATORY_TRACT | Status: DC
  Administered 2012-02-01 – 2012-02-02 (×3): 2 via RESPIRATORY_TRACT
  Filled 2012-02-01: qty 6

## 2012-02-01 MED ORDER — METHYLPREDNISOLONE SODIUM SUCC 125 MG IJ SOLR
60.0000 mg | Freq: Two times a day (BID) | INTRAMUSCULAR | Status: DC
  Administered 2012-02-02: 60 mg via INTRAVENOUS
  Filled 2012-02-01 (×5): qty 0.96

## 2012-02-01 NOTE — Progress Notes (Signed)
Subjective: Sitting up in bed leaning forward slightly with both arms braced on bed, pursed lip breathing. Reports breathing "a little better from yesterday"  Objective: Vital signs Filed Vitals:   01/31/12 2347 02/01/12 0453 02/01/12 0511 02/01/12 0802  BP:  146/81    Pulse:  93    Temp:  98 F (36.7 C)    TempSrc:  Oral    Resp:  23    Height:      Weight:      SpO2: 92% 91% 92% 89%   Weight change:  Last BM Date:  (PTA)  Intake/Output from previous day: 04/03 0701 - 04/04 0700 In: 444 [P.O.:444] Out: -      Physical Exam: General: Alert, awake, oriented x3, moderate distress due to SOB. Very frail appearing.  HEENT: No bruits, no goiter. PERRL, mucus membranes mouth slightly dry/pink. Lower lip with old bld. Heart: Regular rate and rhythm, without murmurs, rubs, gallops. Lungs: Moderate increase work of breathing. Very decreased breath sounds. No wheeze.  Frequent dry cough with conversation, drinking, eating. Pursed lip breathing.   Abdomen: Soft, nontender, nondistended, positive bowel sounds. Extremities: No clubbing cyanosis or edema with positive pedal pulses. Neuro: Grossly intact, nonfocal.    Lab Results: Basic Metabolic Panel:  Basename 01/31/12 1215  NA 129*  K 4.3  CL 93*  CO2 28  GLUCOSE 121*  BUN 8  CREATININE 0.69  CALCIUM 9.6  MG --  PHOS --   Liver Function Tests: No results found for this basename: AST:2,ALT:2,ALKPHOS:2,BILITOT:2,PROT:2,ALBUMIN:2 in the last 72 hours No results found for this basename: LIPASE:2,AMYLASE:2 in the last 72 hours No results found for this basename: AMMONIA:2 in the last 72 hours CBC:  Basename 01/31/12 1215  WBC 7.1  NEUTROABS --  HGB 14.7  HCT 42.9  MCV 99.8  PLT 201   Cardiac Enzymes: No results found for this basename: CKTOTAL:3,CKMB:3,CKMBINDEX:3,TROPONINI:3 in the last 72 hours BNP:  Basename 01/31/12 1215  PROBNP 267.1*   D-Dimer: No results found for this basename: DDIMER:2 in the last  72 hours CBG: No results found for this basename: GLUCAP:6 in the last 72 hours Hemoglobin A1C: No results found for this basename: HGBA1C in the last 72 hours Fasting Lipid Panel: No results found for this basename: CHOL,HDL,LDLCALC,TRIG,CHOLHDL,LDLDIRECT in the last 72 hours Thyroid Function Tests: No results found for this basename: TSH,T4TOTAL,FREET4,T3FREE,THYROIDAB in the last 72 hours Anemia Panel: No results found for this basename: VITAMINB12,FOLATE,FERRITIN,TIBC,IRON,RETICCTPCT in the last 72 hours Coagulation: No results found for this basename: LABPROT:2,INR:2 in the last 72 hours Urine Drug Screen: Drugs of Abuse  No results found for this basename: labopia, cocainscrnur, labbenz, amphetmu, thcu, labbarb    Alcohol Level: No results found for this basename: ETH:2 in the last 72 hours Urinalysis: No results found for this basename: COLORURINE:2,APPERANCEUR:2,LABSPEC:2,PHURINE:2,GLUCOSEU:2,HGBUR:2,BILIRUBINUR:2,KETONESUR:2,PROTEINUR:2,UROBILINOGEN:2,NITRITE:2,LEUKOCYTESUR:2 in the last 72 hours Misc. Labs:  No results found for this or any previous visit (from the past 240 hour(s)).  Studies/Results: Dg Chest Port 1 View  01/31/2012  *RADIOLOGY REPORT*  Clinical Data: Shortness of breath.  PORTABLE CHEST - 1 VIEW  Comparison: 01/23/2012,  05/26/2011 and 01/31/2010.  Findings: Biapical pleural thickening without associated bony destruction unchanged.  Chronic lung changes stable.  Pulmonary vascular prominence unchanged.  No segmental infiltrate or pneumothorax.  Heart size within normal limits.  IMPRESSION: No acute abnormality.  Please see above.  Original Report Authenticated By: Fuller Canada, M.D.    Medications: Scheduled Meds:   . acetaminophen  1,000 mg Oral Once  .  albuterol  10 mg Nebulization Once  . albuterol  2.5 mg Nebulization Q4H WA  . aspirin EC  81 mg Oral Daily  . citalopram  40 mg Oral Daily  . enoxaparin  40 mg Subcutaneous Q24H  . ipratropium   0.5 mg Nebulization Q4H WA  . ipratropium  1 mg Nebulization Once  . losartan  25 mg Oral Daily  . methylPREDNISolone (SOLU-MEDROL) injection  80 mg Intravenous Q12H  . predniSONE  60 mg Oral Once  . sodium chloride  3 mL Intravenous Q12H  . DISCONTD: acetaminophen  650 mg Oral Once  . DISCONTD: albuterol  2.5 mg Nebulization Q6H  . DISCONTD: ipratropium  0.5 mg Nebulization Q6H   Continuous Infusions:  PRN Meds:.sodium chloride, acetaminophen, acetaminophen, albuterol, diphenhydrAMINE, guaiFENesin-dextromethorphan, morphine, ondansetron (ZOFRAN) IV, ondansetron (ZOFRAN) IV, ondansetron, oxyCODONE, sodium chloride, DISCONTD: albuterol  Assessment/Plan:  Active Problems:  TOBACCO ABUSE  COPD  Hx of MI < 8 weeks  Takotsubo cardiomyopathy  .TOBACCO ABUSE: The patient has not resumed smoking since her previous hospitalization.   Marland KitchenCOPD: Principal problem. Minimal improvement. Continue solumedrol and scheduled nebs. Sats range 89-93 on 2L. Will request pulmonary consult.   Marland KitchenHx of MI < 8 weeks: Stable. No evidence of any cardiac irregularities. Patient remains chest pain-free. Continue tel.   Mild hyponatremia: will recheck and monitor.   .Takotsubo cardiomyopathy: BNP relative are normal.    Code status: full code.    Dispo: home with home health in 24-48 hours hopefully     LOS: 1 day   [facility] M 02/01/2012, 8:41 AM

## 2012-02-01 NOTE — Progress Notes (Signed)
Patient seen and examined and discussed with my nurse practitioner. Agree with her note. Patient still has a difficult time moving large amounts of air through, but there is no tightness in terms of wheezing. She's feeling mildly better than yesterday, likely some of this is brought on by continued supplemental oxygen. Appreciate pulmonary followup. Treat supportively including PPI and anxiety. Continue steroids plus nebulizers plus oxygen. Possible discharge tomorrow.

## 2012-02-01 NOTE — Progress Notes (Signed)
   CARE MANAGEMENT NOTE 02/01/2012  Patient:  Heidi [name],Heidi [name]   Account Number:  [id number]  Date Initiated:  02/01/2012  Documentation initiated by:  Lanier Clam  Subjective/Objective Assessment:   ADMITTED W/SOB.     Action/Plan:   FROM HOME  W/FAMILY   Anticipated DC Date:  02/02/2012   Anticipated DC Plan:  HOME/SELF CARE         Choice offered to / List presented to:             Status of service:  In process, will continue to follow Medicare Important Message given?   (If response is "NO", the following Medicare IM given date fields will be blank) Date Medicare IM given:   Date Additional Medicare IM given:    Discharge Disposition:    Per UR Regulation:  Reviewed for med. necessity/level of care/duration of stay  If discussed at Long Length of Stay Meetings, dates discussed:    Comments:  02/01/12 [facility] RN,BSN, NCM [phone]

## 2012-02-01 NOTE — Progress Notes (Signed)
Pt 02 sat was 83-84% on RA resting, 90-92% on 3L [place] on rest & whlie ambulating.

## 2012-02-01 NOTE — Plan of Care (Signed)
Problem: Phase I Progression Outcomes Goal: O2 sats > or equal 90% or at baseline Outcome: Completed/Met Date Met:  02/01/12 With O2 therapy

## 2012-02-01 NOTE — Consult Note (Signed)
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 Patient has a history of severe uncontrolled COPD which was felt to be a contributing factor. She was discharged home, but for the last 2 days had progressively worsening shortness of breath and severe cough-nonproductive which got to the point where she could feel it she was barely breathing. She came to the emergency room for further evaluation  In the emergency room, patient was noted to have a normal chest x-ray with nothing acute. Vital signs were stable other than oxygen desaturations noted on room air. She required 3 L to keep her oxygen above 90%. No signs of any acute infection were noted. Hospitalists were called for treatment and admission         Past Medical History  Diagnosis Date  . COPD (chronic obstructive pulmonary disease)   . NSTEMI (non-ST elevated myocardial infarction)     12/2011 with normal coronaries possibly secondary to Takotsubo (EF 30-35% by echo), occurred following two episodes of acute respiratory distress  . Hypotension     limiting med titration   Past Surgical History  Procedure Date  . Abdominal hysterectomy   . Vagina rectal repair     after childbirth   Prior to Admission medications   Medication Sig Start Date End Date Taking? Authorizing Provider  albuterol (PROVENTIL HFA;VENTOLIN HFA) 108 (90 BASE) MCG/ACT inhaler Inhale 2 puffs into the lungs every 6 (six) hours as needed. Shortness of breath   Yes Historical Provider, MD  albuterol  (PROVENTIL) (2.5 MG/3ML) 0.083% nebulizer solution Take 3 mLs (2.5 mg total) by nebulization every 4 (four) hours as needed for wheezing or shortness of breath. 10/16/11 10/15/12 Yes Dawn Vidal Schwalbe, PA  aspirin EC 81 MG EC tablet Take 1 tablet (81 mg total) by mouth daily. 01/25/12 01/24/13 Yes Dayna N Dunn, PA  budesonide-formoterol (SYMBICORT) 160-4.5 MCG/ACT inhaler Inhale 2 puffs into the lungs 2 (two) times daily.     Yes Historical Provider, MD  citalopram (CELEXA) 40 MG tablet Take 40 mg by mouth daily.     Yes Historical Provider, MD  ipratropium (ATROVENT HFA) 17 MCG/ACT inhaler Inhale 2 puffs into the lungs every 6 (six) hours.     Yes Historical Provider, MD  losartan (COZAAR) 25 MG tablet Take 1 tablet (25 mg total) by mouth daily. 01/25/12 01/24/13 Yes Dayna N Dunn, PA   Allergies Allergies  Allergen Reactions  . Codeine Nausea Only    Family History Family History  Problem Relation Age of Onset  . Heart attack Mother     deceased from massive MI at age 12  . Liver disease Father     fatty liver; living, age 57  . Stroke Brother     at age 40, living   . Hypertension Brother     living, age 49    Social History  reports that she quit smoking 11 days ago. Her smoking use included Cigarettes. She quit after 30 years of use. She has never used smokeless tobacco.  She reports that she drinks alcohol. She reports that she does not use illicit drugs.     Vital Signs: BP 120/79  Pulse 107  Temp(Src) 98 F (36.7 C) (Oral)  Resp 22  Ht 5\' 7"  (1.702 m)  Wt 106 lb (48.081 kg)  BMI 16.60 kg/m2  SpO2 92%  3lpm  Intake/Output Summary (Last 24 hours) at 02/01/12 1621 Last data filed at 02/01/12 1300  Gross per 24 hour  Intake    924 ml  Output    200 ml  Net    724 ml     Physical Examination: Anxious wf with 3 word sob at rest and acc muscle use HEENT mild turbinate edema.  Oropharynx no thrush or excess pnd or cobblestoning.  No JVD or cervical adenopathy. Mild  accessory muscle hypertrophy. Trachea midline, nl thryroid. Chest was hyperinflated by percussion with diminished breath sounds and moderate increased exp time without wheeze. Hoover sign positive at mid inspiration. Regular rate and rhythm without murmur gallop or rub or increase P2 or edema.  Abd: no hsm, nl excursion. Ext warm without cyanosis or clubbing.         Labs    Lab 02/01/12 0925 01/31/12 1215  NA 132* 129*  K 3.8 4.3  CL 94* 93*  CO2 27 28  BUN 10 8  CREATININE 0.60 0.69  GLUCOSE 200* 121*    Lab 01/31/12 1215  HGB 14.7  HCT 42.9  WBC 7.1  PLT 201        CXR 4/3 Biapical pleural thickening without associated bony  destruction unchanged. Chronic lung changes stable. Pulmonary  vascular prominence unchanged. No segmental infiltrate or  pneumothorax. Heart size within normal limits.     ASSESSMENT AND PLAN    PULMONARY  Lab 02/01/12 1030  PHART 7.409*  PCO2ART 39.4  PO2ART 66.5*  HCO3 24.4*  O2SAT 94.1   A: Severe copd with mostly emphysematous features s/p recent smoking cessation with ? aecod  DDX of  difficult airways managment all start with A and  include Adherence, Ace Inhibitors, Acid Reflux, Active Sinus Disease, Alpha 1 Antitripsin deficiency, Anxiety masquerading as Airways dz,  ABPA,  allergy(esp in young), Aspiration (esp in elderly), Adverse effects of DPI,  Active smokers, plus two Bs  = Bronchiectasis and Beta blocker use..and one C= CHF   Adherence is always the initial "prime suspect" and is a multilayered concern that requires a "trust but verify" approach in every patient - starting with knowing how to use medications, especially inhalers, correctly, keeping up with refills and understanding the fundamental difference between maintenance and prns vs those medications only taken for a very short course and then stopped and not refilled.   ? Acid reflux > can't exclude so will rx  ? Surreptitiously still smoking > check cotinine  levels  ? chf > bnp relatively low, cxr does not support  ? Anxiety > usually a dx of exclusion but common in the first 6 months off cigs  CARDIOVASCULAR  Lab 01/31/12 1215  TROPONINI --  LATICACIDVEN --  PROBNP 267.1*       RENAL  Lab 02/01/12 0925 01/31/12 1215  NA 132* 129*  K 3.8 4.3  CL 94* 93*  CO2 27 28  BUN 10 8  CREATININE 0.60 0.69  CALCIUM 9.5 9.6  MG -- --  PHOS -- --           Sandrea Hughs, MD Pulmonary and Critical Care Medicine [facility] Cell [phone]  If no answer call [phone]   02/01/2012, 4:21 PM

## 2012-02-02 LAB — NICOTINE/COTININE METABOLITES: Cotinine: <10 ng/mL

## 2012-02-02 MED ORDER — AMOXICILLIN-POT CLAVULANATE 875-125 MG PO TABS
1.0000 | ORAL_TABLET | Freq: Two times a day (BID) | ORAL | Status: DC
  Administered 2012-02-02: 1 via ORAL
  Filled 2012-02-02 (×4): qty 1

## 2012-02-02 MED ORDER — ALBUTEROL SULFATE (5 MG/ML) 0.5% IN NEBU: 2.5000 mg | INHALATION_SOLUTION | RESPIRATORY_TRACT | Status: DC

## 2012-02-02 MED ORDER — OXYCODONE HCL 5 MG PO TABS: 5.0000 mg | ORAL_TABLET | ORAL | Status: AC | PRN

## 2012-02-02 MED ORDER — PANTOPRAZOLE SODIUM 40 MG PO TBEC: 40.0000 mg | DELAYED_RELEASE_TABLET | Freq: Two times a day (BID) | ORAL | Status: DC

## 2012-02-02 MED ORDER — ALPRAZOLAM 0.25 MG PO TABS: 0.2500 mg | ORAL_TABLET | Freq: Three times a day (TID) | ORAL | Status: AC | PRN

## 2012-02-02 MED ORDER — ALPRAZOLAM 0.25 MG PO TABS
0.2500 mg | ORAL_TABLET | Freq: Two times a day (BID) | ORAL | Status: DC | PRN
  Administered 2012-02-02: 0.25 mg via ORAL
  Filled 2012-02-02: qty 1

## 2012-02-02 MED ORDER — GUAIFENESIN-DM 100-10 MG/5ML PO SYRP: 5.0000 mL | ORAL_SOLUTION | ORAL | Status: AC | PRN

## 2012-02-02 MED ORDER — PREDNISONE 10 MG PO TABS: ORAL_TABLET | ORAL | Status: DC

## 2012-02-02 MED ORDER — AMOXICILLIN-POT CLAVULANATE 875-125 MG PO TABS: 1.0000 | ORAL_TABLET | Freq: Two times a day (BID) | ORAL | Status: AC

## 2012-02-02 MED ORDER — IPRATROPIUM BROMIDE 0.02 % IN SOLN: 0.5000 mg | RESPIRATORY_TRACT | Status: DC

## 2012-02-02 MED ORDER — SALINE SPRAY 0.65 % NA SOLN
1.0000 | NASAL | Status: DC | PRN
  Administered 2012-02-02: 1 via NASAL
  Filled 2012-02-02: qty 44

## 2012-02-02 MED ORDER — SALINE SPRAY 0.65 % NA SOLN: 1.0000 | NASAL | Status: DC | PRN

## 2012-02-02 NOTE — Discharge Summary (Signed)
Patient seen, discussed and examined with my nurse practitioner. Agree with above. Doing better. We'll plan to discharge home with oxygen and with close followup pulmonary, she'll hopefully do well.

## 2012-02-02 NOTE — Discharge Instructions (Signed)

## 2012-02-02 NOTE — Progress Notes (Signed)
Inpatient Diabetes Program Recommendations  AACE/ADA: New Consensus Statement on Inpatient Glycemic Control (2009)  Target Ranges:  Prepandial:   less than 140 mg/dL      Peak postprandial:   less than 180 mg/dL (1-2 hours)      Critically ill patients:  140 - 180 mg/dL    Results for Heidi [name], Heidi [name] (MRN [id number]) as of 02/02/2012 09:59  Ref. Range 02/01/2012 09:25  Glucose Latest Range: 70-99 mg/dL 563 (H)    Inpatient Diabetes Program Recommendations Correction (SSI): May want to check CBGs and add Novolog Sensitive correction scale (SSI) if CBGs elevated while patient on IV steroids.  Note: will follow. Ambrose Finland RN, MSN, CDE Diabetes Coordinator Inpatient Diabetes Program [phone]

## 2012-02-02 NOTE — Progress Notes (Signed)
   CARE MANAGEMENT NOTE 02/02/2012  Patient:  Heidi [name]   Account Number:  [id number]  Date Initiated:  02/01/2012  Documentation initiated by:  Lanier Clam  Subjective/Objective Assessment:   ADMITTED W/SOB.     Action/Plan:   FROM HOME  W/FAMILY   Anticipated DC Date:  02/02/2012   Anticipated DC Plan:  HOME/SELF CARE      DC Planning Services  GCC[facility] / P4HM (established/new)      Choice offered to / List presented to:  C-1 Patient   DME arranged  [facility]      DME agency  [facility]        Status of service:  Completed, signed off Medicare Important Message given?   (If response is "NO", the following Medicare IM given date fields will be blank) Date Medicare IM given:   Date Additional Medicare IM given:    Discharge Disposition:  HOME/SELF CARE  Per UR Regulation:  Reviewed for med. necessity/level of care/duration of stay  If discussed at Long Length of Stay Meetings, dates discussed:    Comments:  02/02/12 Heidi Linden RN,BSN NCM [phone] FOR PCP,WILL CALL FOR APPT ONCE RE-OPEN @ 2P.[facility] 02.[facility] FOR COMMUNITY RESOURCES, & HOME NURSE TO [facility].  02/01/12 Heidi Larock RN,BSN, NCM [phone]

## 2012-02-02 NOTE — Consult Note (Signed)
Name: Heidi [name] MRN: [id number] DOB: [birth year]    LOS: 2  [place] [facility]  NOTE  Brief patient profile: : 55 yowf with clinical evidence of severe copd s/p recent smoking cessation admit 4/3 with aecopd and pccm svc asked to see on 4/4     Micro/sepsis markers:   Antibiotics:   Tests / Events:     Vital Signs: BP 128/76  Pulse 115  Temp(Src) 97.6 F (36.4 C) (Oral)  Resp 20  Ht 5\' 7"  (1.702 m)  Wt 106 lb (48.081 kg)  BMI 16.60 kg/m2  SpO2 85%  3lpm  Intake/Output Summary (Last 24 hours) at 02/02/12 1341 Last data filed at 02/02/12 0800  Gross per 24 hour  Intake    702 ml  Output   1200 ml  Net   -498 ml     Physical Examination: Chronically ill appearing female, prolonged exp phase  HEENT mild turbinate edema.   No JVD or cervical adenopathy.  Trachea midline, nl thryroid. Chest was hyperinflated by percussion with diminished breath sounds and moderate increased exp time without wheeze.  Regular rate and rhythm without murmur gallop or rub or increase P2 or edema.  Abd: no hsm, nl excursion. Ext warm without cyanosis or clubbing.       Labs    Lab 02/01/12 0925 01/31/12 1215  NA 132* 129*  K 3.8 4.3  CL 94* 93*  CO2 27 28  BUN 10 8  CREATININE 0.60 0.69  GLUCOSE 200* 121*    Lab 01/31/12 1215  HGB 14.7  HCT 42.9  WBC 7.1  PLT 201    CXR 4/3 Biapical pleural thickening without associated bony  destruction unchanged. Chronic lung changes stable. Pulmonary  vascular prominence unchanged. No segmental infiltrate or  pneumothorax. Heart size within normal limits.     ASSESSMENT AND PLAN    PULMONARY  Lab 02/01/12 1030  PHART 7.409*  PCO2ART 39.4  PO2ART 66.5*  HCO3 24.4*  O2SAT 94.1   A: Severe copd with mostly emphysematous features s/p recent smoking cessation with ? aecod  DDX of  difficult airways managment all start with A and  include Adherence, Ace Inhibitors, Acid Reflux, Active Sinus Disease, Alpha 1 Antitripsin  deficiency, Anxiety masquerading as Airways dz,  ABPA,  allergy(esp in young), Aspiration (esp in elderly), Adverse effects of DPI,  Active smokers, plus two Bs  = Bronchiectasis and Beta blocker use..and one C= CHF   Adherence is always the initial "prime suspect" and is a multilayered concern that requires a "trust but verify" approach in every patient - starting with knowing how to use medications, especially inhalers, correctly, keeping up with refills and understanding the fundamental difference between maintenance and prns vs those medications only taken for a very short course and then stopped and not refilled.   ? Acid reflux > can't exclude so will rx  ? Surreptitiously still smoking > check cotinine levels  ? chf > bnp relatively low, cxr does not support  ? Anxiety > usually a dx of exclusion but common in the first 6 months off cigs   -continue O2, will need set up for home -Augmentin -symbicort, IV steroids, Scheduled Nebs -pulmonary hygiene   CARDIOVASCULAR  Lab 01/31/12 1215  TROPONINI --  LATICACIDVEN --  PROBNP 267.1*     RENAL  Lab 02/01/12 0925 01/31/12 1215  NA 132* 129*  K 3.8 4.3  CL 94* 93*  CO2 27 28  BUN 10 8  CREATININE 0.60 0.69  CALCIUM 9.5 9.6  MG -- --  PHOS -- --    PCCM will sign off.  Pt indicates she has a pulmonary outpatient follow up scheduled.      Canary Brim, NP-C [facility] Pgr: [phone]  Pt independently  seen and examined and available cxr's reviewed and I agree with above findings/ imp/ plan  Sandrea Hughs, MD Pulmonary and Critical Care Medicine [facility] Cell [phone]

## 2012-02-02 NOTE — Discharge Summary (Signed)
Physician Discharge Summary  Patient ID: Heidi [name] MRN: [id number] DOB/AGE: 06-Oct-1956 55 y.o.  Admit date: 01/31/2012 Discharge date: 02/02/2012  Primary Care Physician:  Lehman Prom, NP, NP   Discharge Diagnoses:    Active Problems:  TOBACCO ABUSE  COPD  Hx of MI < 8 weeks  Takotsubo cardiomyopathy   Medication List  As of 02/02/2012 11:38 AM   STOP taking these medications         albuterol (2.5 MG/3ML) 0.083% nebulizer solution         TAKE these medications         albuterol 108 (90 BASE) MCG/ACT inhaler   Commonly known as: PROVENTIL HFA;VENTOLIN HFA   Inhale 2 puffs into the lungs every 6 (six) hours as needed. Shortness of breath      albuterol (5 MG/ML) 0.5% nebulizer solution   Commonly known as: PROVENTIL   Take 0.5 mLs (2.5 mg total) by nebulization every 4 (four) hours.      amoxicillin-clavulanate 875-125 MG per tablet   Commonly known as: AUGMENTIN   Take 1 tablet by mouth every 12 (twelve) hours.      aspirin 81 MG EC tablet   Take 1 tablet (81 mg total) by mouth daily.      budesonide-formoterol 160-4.5 MCG/ACT inhaler   Commonly known as: SYMBICORT   Inhale 2 puffs into the lungs 2 (two) times daily.      citalopram 40 MG tablet   Commonly known as: CELEXA   Take 40 mg by mouth daily.      guaiFENesin-dextromethorphan 100-10 MG/5ML syrup   Commonly known as: ROBITUSSIN DM   Take 5 mLs by mouth every 4 (four) hours as needed for cough.      ipratropium 0.02 % nebulizer solution   Commonly known as: ATROVENT   Take 2.5 mLs (0.5 mg total) by nebulization every 4 (four) hours.      ipratropium 17 MCG/ACT inhaler   Commonly known as: ATROVENT HFA   Inhale 2 puffs into the lungs every 6 (six) hours.      losartan 25 MG tablet   Commonly known as: COZAAR   Take 1 tablet (25 mg total) by mouth daily.      oxyCODONE 5 MG immediate release tablet   Commonly known as: Oxy IR/ROXICODONE   Take 1 tablet (5 mg total) by mouth every 4  (four) hours as needed.      pantoprazole 40 MG tablet   Commonly known as: PROTONIX   Take 1 tablet (40 mg total) by mouth 2 (two) times daily before a meal.      predniSONE 10 MG tablet   Commonly known as: DELTASONE   Take 6 tabs 4/6, take 4 tabs 4/7 and 4/8, then take 2 tabs 4/9 and 4/10 then take 1 tab 4/11 then stopl      sodium chloride 0.65 % Soln nasal spray   Commonly known as: OCEAN   Place 1 spray into the nose as needed for congestion.             Disposition and Follow-up: pt medically stable and ready for discharge to home. Has follow up appointment with Dr. Sherene Sires 4/29. Will see cardiology 4/22 as scheduled pre-admisson  Consults:  pulmonary/intensive care   Physical exam:  General: Alert, awake, oriented x3 NAD Very frail appearing.  HEENT: No bruits, no goiter. PERRL, mucus membranes mouth slightly dry/pink.   Heart: Regular rate and rhythm, without murmurs, rubs, gallops.  Lungs: Mild  increase work of breathing with conversation. Very decreased breath sounds. No wheeze. Some extended expiratory phase. No use accessory muscles.   Abdomen: Soft, nontender, nondistended, positive bowel sounds.  Extremities: No clubbing cyanosis or edema with positive pedal pulses.  Neuro: Grossly intact, nonfocal.   Significant Diagnostic Studies:  Dg Chest Port 1 View  01/31/2012  *RADIOLOGY REPORT*  Clinical Data: Shortness of breath.  PORTABLE CHEST - 1 VIEW  Comparison: 01/23/2012,  05/26/2011 and 01/31/2010.  Findings: Biapical pleural thickening without associated bony destruction unchanged.  Chronic lung changes stable.  Pulmonary vascular prominence unchanged.  No segmental infiltrate or pneumothorax.  Heart size within normal limits.  IMPRESSION: No acute abnormality.  Please see above.  Original Report Authenticated By: Fuller Canada, M.D.    Labs Reviewed  BASIC METABOLIC PANEL - Abnormal; Notable for the following:    Sodium 129 (*)    Chloride 93 (*)    Glucose,  Bld 121 (*)    All other components within normal limits  CBC - Abnormal; Notable for the following:    MCH 34.2 (*)    All other components within normal limits  PRO B NATRIURETIC PEPTIDE - Abnormal; Notable for the following:    Pro B Natriuretic peptide (BNP) 267.1 (*)    All other components within normal limits  BASIC METABOLIC PANEL - Abnormal; Notable for the following:    Sodium 132 (*)    Chloride 94 (*)    Glucose, Bld 200 (*)    All other components within normal limits  BLOOD GAS, ARTERIAL - Abnormal; Notable for the following:    pH, Arterial 7.409 (*)    pO2, Arterial 66.5 (*)    Bicarbonate 24.4 (*)    All other components within normal limits  NICOTINE/COTININE METABOLITES        Dg Chest 2 View  01/23/2012  *RADIOLOGY REPORT*  Clinical Data: Shortness of breath; difficulty breathing.  History of smoking.  CHEST - 2 VIEW  Comparison: Chest radiograph performed 05/26/2011  Findings: The lungs are hyperexpanded, with flattening of the hemidiaphragms, compatible with COPD.  Chronic peribronchial thickening is seen.  There is no evidence of focal opacification, pleural effusion or pneumothorax.  Mild biapical scarring is again noted.  The heart is normal in size; the mediastinal contour is within normal limits.  No acute osseous abnormalities are seen.  IMPRESSION: Findings of COPD; no acute cardiopulmonary process seen.  Original Report Authenticated By: Tonia Ghent, M.D.   Dg Chest Port 1 View  01/31/2012  *RADIOLOGY REPORT*  Clinical Data: Shortness of breath.  PORTABLE CHEST - 1 VIEW  Comparison: 01/23/2012,  05/26/2011 and 01/31/2010.  Findings: Biapical pleural thickening without associated bony destruction unchanged.  Chronic lung changes stable.  Pulmonary vascular prominence unchanged.  No segmental infiltrate or pneumothorax.  Heart size within normal limits.  IMPRESSION: No acute abnormality.  Please see above.  Original Report Authenticated By: Fuller Canada,  M.D.       Brief H and P: For complete details please refer to admission H and P, but in brief   Patient is a 56 year old white female who was discharged from the cardiology service 01/26/12 for hospitalization for non-STEMI plus a new diagnosis of Takosubo's cardiomyopathy with ejection fraction of 30-40%. Patient has a history of severe uncontrolled COPD which was felt to be a contributing factor. She was discharged home, but for the last 2 days had progressively worsening shortness of breath and severe cough-nonproductive which got to the  point where she could feel it she was barely breathing. She came to the emergency room on 01/31/12 for further evaluation  In the emergency room, patient was noted to have a normal chest x-ray with nothing acute. Vital signs were stable other than oxygen desaturations noted on room air. She required 3 L to keep her oxygen above 90%. No signs of any acute infection were noted. Hospitalists were called for treatment and admission   Hospital Course:   Active Problems:  TOBACCO ABUSE  COPD  Hx of MI < 8 weeks  Takotsubo cardiomyopathy  .TOBACCO ABUSE: The patient has not resumed smoking since her previous hospitalization.   Marland KitchenCOPD: Principal problem. Pt admitted to [facility] tele. She was provided with 02 support, solumedrol and nebulizer treatments. Chest xray as above. ABG's as above. Minimal improvement on day one. Seen by pulmonology. Recommended stricter adherence to regimen. On day of discharge pt much improved. She will need home o2 and will provide prednisone taper and home nebs. Has appointment with pulmonology 02/26/12.    Marland KitchenHx of MI < 8 weeks: Stable. No evidence of any cardiac irregularities. Patient remains chest pain-free. Has appointment with cardiology 02/19/12   Mild hyponatremia:  Sodium 132 at discharge. May need follow up bmet 02/19/12 at cards appointment.   .Takotsubo cardiomyopathy: BNP relative are normal. Has follow up appointment  02/19/12  Time spent on Discharge: 40 minutes  Signed: Gwenyth Bender 02/02/2012, 11:38 AM

## 2012-02-02 NOTE — Progress Notes (Signed)
Pt's SpO2 ranges from 88-93% on room air at rest and from 85-89% while ambulating.

## 2012-02-15 ENCOUNTER — Telehealth: Payer: Self-pay | Admitting: Internal Medicine

## 2012-02-15 NOTE — Telephone Encounter (Signed)
Let her know unless she's able to monitor her 02 sats it may be she's still too low on RA but I have no way of making sure about this until ov - however, if she still refuses to wear it, ok to document her refusal and dc.

## 2012-02-15 NOTE — Telephone Encounter (Signed)
I spoke with pt and she states she is wanting a order to d/c her oxygen bc she has not used it x 1 week. MW saw pt in the hospital and she has not been seen in the office. She is scheduled for HFU w/ MW on 02/26/12. Please advise MW thanks

## 2012-02-16 MED ORDER — ALBUTEROL SULFATE (2.5 MG/3ML) 0.083% IN NEBU: 2.5000 mg | INHALATION_SOLUTION | RESPIRATORY_TRACT | Status: DC | PRN

## 2012-02-16 MED ORDER — IPRATROPIUM BROMIDE 0.02 % IN SOLN: 0.5000 mg | RESPIRATORY_TRACT | Status: DC

## 2012-02-16 NOTE — Telephone Encounter (Signed)
Left message on named answering machine informing pt that her albuterol and atrovent have been sent to the [facility] on [address].  Advised pt to keep her 4.29.13 hfu with MW and encouraged her to call with any questions/concerns.  Will change the albuterol 0.5% neb soln to the 0.083% per our protocol.  Also, atrovent is on pt's med list twice; will refill the one given at discharge, and remove the other.

## 2012-02-16 NOTE — Telephone Encounter (Signed)
Ok to refill 

## 2012-02-16 NOTE — Telephone Encounter (Signed)
I spoke with pt and she is states she will wait until she comes in for her OV w/ MW for her HFU and will wear her oxygen. Pt also now states she needs refills on her atrovent and albuterol nebs. This was given to her in the hospital. Is this okay to refill Dr. Sherene Sires, thanks   [facility] [address]

## 2012-02-19 ENCOUNTER — Ambulatory Visit: Payer: Self-pay | Admitting: Physician Assistant

## 2012-02-26 ENCOUNTER — Ambulatory Visit (INDEPENDENT_AMBULATORY_CARE_PROVIDER_SITE_OTHER): Payer: Self-pay | Admitting: Internal Medicine

## 2012-02-26 ENCOUNTER — Encounter: Payer: Self-pay | Admitting: Internal Medicine

## 2012-02-26 VITALS — BP 120/60 | HR 85 | Temp 98.1°F | Ht 66.0 in | Wt 114.0 lb

## 2012-02-26 DIAGNOSIS — J449 Chronic obstructive pulmonary disease, unspecified: Secondary | ICD-10-CM

## 2012-02-26 NOTE — Assessment & Plan Note (Signed)
Mod severe with tendency to apparent exac, needs baseline pft's > requested  DDX of  difficult airways managment all start with A and  include Adherence, Ace Inhibitors, Acid Reflux, Active Sinus Disease, Alpha 1 Antitripsin deficiency, Anxiety masquerading as Airways dz,  ABPA,  allergy(esp in young), Aspiration (esp in elderly), Adverse effects of DPI,  Active smokers, plus two Bs  = Bronchiectasis and Beta blocker use..and one C= CHF  Adherence is always the initial "prime suspect" and is a multilayered concern that requires a "trust but verify" approach in every patient - starting with knowing how to use medications, especially inhalers, correctly, keeping up with refills and understanding the fundamental difference between maintenance and prns vs those medications only taken for a very short course and then stopped and not refilled. The proper method of use, as well as anticipated side effects, of a metered-dose inhaler are discussed and demonstrated to the patient. Improved effectiveness after extensive coaching during this visit to a level of approximately  75%  ? Active smoking > I reviewed the Flethcher curve with patient that basically indicates  if you quit smoking when your best day FEV1 is still well preserved it is highly unlikely you will progress to severe disease and informed the patient there was no medication on the market that has proven to change the curve or the likelihood of progression.  Therefore stopping smoking and maintaining abstinence is the most important aspect of care, not choice of inhalers or for that matter, doctors.

## 2012-02-26 NOTE — Progress Notes (Deleted)
dsdfsa

## 2012-02-26 NOTE — Patient Instructions (Signed)
Symbicort 160 Take 2 puffs first thing in am and then another 2 puffs about 12 hours later.    Only use your albuterol/atrovent  as a rescue medication to be used if you can't catch your breath by resting or doing a relaxed purse lip breathing pattern. The less you use it, the better it will work when you need it. -  Use nebulizer to back up the inhalers  Please schedule a follow up office visit in 4 weeks, sooner if needed with PFTs

## 2012-02-26 NOTE — Progress Notes (Signed)
Subjective:     Patient ID: Heidi [name], female   DOB: Mar 22, 1956  MRN: [id number]  HPI 36 yowf quit smoking 01/23/2012 at [facility] with aecopd superimposed on chronic doe since around 2008 previously controlled with prn inhalers like combivent but since 2011 using these daily.   Admit date: 01/23/2012  D/C date: 01/25/2012   Primary Discharge Diagnoses:  1. NSTEMI secondary to possible Takotsubo cardiomyopathy  - EF 30-35% by echo this admission  - no evidence for obstructive CAD by cath 01/23/12  2. Acute respiratory failure prior to admission  3. Hypotension, limiting medication titration  4. Severe COPD   Admit date: 01/31/2012  Discharge date: 02/02/2012  Primary Care Physician: Lehman Prom, NP, NP  Discharge Diagnoses:  Active Problems:  TOBACCO ABUSE  COPD  Hx of MI < 8 weeks  Takotsubo cardiomyopathy    02/26/2012 1st pulmonary ov/ [facility] post hosp doe not back to baseline = Oct no 02 doe x [employer] but didn't need 02 and supposed to be on symbicort since discharge  but hand not used it on day of ov and doe x [employer]  and over using her neb and hfa's.  No cough or purulent sputum  Sleeping ok without nocturnal  or early am exacerbation  of respiratory  c/o's or need for noct saba. Also denies any obvious fluctuation of symptoms with weather or environmental changes or other aggravating or alleviating factors except as outlined above   ROS  At present neg for  any significant sore throat, dysphagia, dental problems, itching, sneezing,  nasal congestion or excess/ purulent secretions, ear ache,   fever, chills, sweats, unintended wt loss, pleuritic or exertional cp, hemoptysis, palpitations, orthopnea pnd or leg swelling.  Also denies presyncope, palpitations, heartburn, abdominal pain, anorexia, nausea, vomiting, diarrhea  or change in bowel or urinary habits, change in stools or urine, dysuria,hematuria,  rash, arthralgias, visual complaints, headache, numbness  weakness or ataxia or problems with walking or coordination. No noted change in mood/affect or memory.                   Review of Systems     Objective:   Physical Exam  Anxious thin wf nad  Wt 114 02/26/2012  HEENT mild turbinate edema.  Oropharynx no thrush or excess pnd or cobblestoning.  No JVD or cervical adenopathy. Mild accessory muscle hypertrophy. Trachea midline, nl thryroid. Chest was hyperinflated by percussion with diminished breath sounds and moderate increased exp time without wheeze. Hoover sign positive at mid inspiration. Regular rate and rhythm without murmur gallop or rub or increase P2 or edema.  Abd: no hsm, nl excursion. Ext warm without cyanosis or clubbing.    cxr 01/31/12 Findings: Biapical pleural thickening without associated bony  destruction unchanged. Chronic lung changes stable. Pulmonary  vascular prominence unchanged. No segmental infiltrate or  pneumothorax. Heart size within normal limits.  IMPRESSION:  No acute abnormality. Please see above.     Assessment:          Plan:

## 2012-03-13 ENCOUNTER — Encounter: Payer: Self-pay | Admitting: Physician Assistant

## 2012-03-13 ENCOUNTER — Ambulatory Visit (INDEPENDENT_AMBULATORY_CARE_PROVIDER_SITE_OTHER): Payer: Self-pay | Admitting: Physician Assistant

## 2012-03-13 VITALS — BP 113/67 | HR 97 | Ht 66.0 in | Wt 113.0 lb

## 2012-03-13 DIAGNOSIS — J449 Chronic obstructive pulmonary disease, unspecified: Secondary | ICD-10-CM

## 2012-03-13 DIAGNOSIS — F17200 Nicotine dependence, unspecified, uncomplicated: Secondary | ICD-10-CM

## 2012-03-13 DIAGNOSIS — I5181 Takotsubo syndrome: Secondary | ICD-10-CM

## 2012-03-13 DIAGNOSIS — J4489 Other specified chronic obstructive pulmonary disease: Secondary | ICD-10-CM

## 2012-03-13 NOTE — Assessment & Plan Note (Signed)
Patient quit smoking 

## 2012-03-13 NOTE — Patient Instructions (Signed)
Your physician recommends that you schedule a follow-up appointment in: 1 MONTH with Dr Jens Som  Your physician recommends that you continue on your current medications as directed. Please refer to the Current Medication list given to you today.

## 2012-03-13 NOTE — Assessment & Plan Note (Signed)
Patient is doing well status post recent hospitalization for MI. She has no evidence of heart failure. I did discuss following a low sodium diet. Hopefully her LV function will recover.

## 2012-03-13 NOTE — Progress Notes (Signed)
HPI:  This is a 56 year old white female patient with no prior cardiac history that presented to the hospital with non-ST elevation MI secondary to possible Takotsubo hernia myopathy. There is no evidence for obstructive CAD on catheter on 01/23/12 EF 45%,the ejection fraction was 30-35% on echo. She also had acute respiratory failure on admission the setting of severe COPD. She was given limited medications due to hypotension. The patient has quit smoking since she's been home.  The patient continues to have chronic dyspnea and dyspnea on exertion.She uses home oxygen. She denies any chest pain, palpitations, dizziness, or presyncope. She denies any edema or weight gain.  Allergies  Allergen Reactions  . Codeine Nausea Only    Current Outpatient Prescriptions on File Prior to Visit  Medication Sig Dispense Refill  . albuterol (PROVENTIL HFA;VENTOLIN HFA) 108 (90 BASE) MCG/ACT inhaler Inhale 2 puffs into the lungs every 6 (six) hours as needed. Shortness of breath      . albuterol (PROVENTIL) (2.5 MG/3ML) 0.083% nebulizer solution Take 3 mLs (2.5 mg total) by nebulization every 4 (four) hours as needed for wheezing or shortness of breath.  360 mL  3  . aspirin EC 81 MG EC tablet Take 1 tablet (81 mg total) by mouth daily.      . budesonide-formoterol (SYMBICORT) 160-4.5 MCG/ACT inhaler Inhale 2 puffs into the lungs 2 (two) times daily.        . citalopram (CELEXA) 20 MG tablet Take 20 mg by mouth daily.      Marland Kitchen ipratropium (ATROVENT) 0.02 % nebulizer solution Take 2.5 mLs (0.5 mg total) by nebulization every 4 (four) hours.  300 mL  3  . losartan (COZAAR) 25 MG tablet Take 1 tablet (25 mg total) by mouth daily.  30 tablet  6  . sodium chloride (OCEAN) 0.65 % SOLN nasal spray Place 1 spray into the nose as needed for congestion.  1 Bottle      Past Medical History  Diagnosis Date  . COPD (chronic obstructive pulmonary disease)   . NSTEMI (non-ST elevated myocardial infarction)     12/2011 with  normal coronaries possibly secondary to Takotsubo (EF 30-35% by echo), occurred following two episodes of acute respiratory distress  . Hypotension     limiting med titration    Past Surgical History  Procedure Date  . Abdominal hysterectomy   . Vagina rectal repair     after childbirth    Family History  Problem Relation Age of Onset  . Heart attack Mother     deceased from massive MI at age 29  . Liver disease Father     fatty liver; living, age 34  . Stroke Brother     at age 37, living   . Hypertension Brother     living, age 46    History   Social History  . Marital Status: Divorced    Spouse Name: N/A    Number of Children: N/A  . 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 Smoking status: Former Smoker -- 30 years    Types: Cigarettes    Quit date: 01/21/2012  . Smokeless tobacco: Never Used   Comment: smoked 1 ppd X 30 years, has recently cut down to 3 packs per week; has quit smoking entirely X 2 days  . Alcohol Use: Yes     1-2 beers per week  . Drug Use: No  .  Sexually Active: Not Currently   Other Topics Concern  . Not on file   Social History Narrative   Ms. 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 She has two children, a daughter and son, who live in [place] area. She lives with her brother.    ROS:see history of present illness otherwise all systems negative   PHYSICAL EXAM: Well-nournished, in no acute distress. Neck: No JVD, HJR, Bruit, or thyroid enlargement Lungs:Decreased breath sounds throughout  without wheezing, rales, or rhonchi Cardiovascular: RRR, PMI not displaced, heart sounds normal, no murmurs, gallops, bruit, thrill, or heave. Abdomen: BS normal. Soft without organomegaly, masses, lesions or tenderness. Extremities: right groin without hematoma or hemorrhage, lower extremities without cyanosis, clubbing or edema. Good distal pulses  bilateral SKin: Warm, no lesions or rashes  Musculoskeletal: No deformities Neuro: no focal signs  BP 113/67  Pulse 97  Ht 5\' 6"  (1.676 m)  Wt 113 lb (51.256 kg)  BMI 18.24 kg/m2   UJW:[id number] sinus rhythm at 97 beats per minute

## 2012-03-13 NOTE — Assessment & Plan Note (Signed)
Patient has severe COPD and is on home oxygen. Follow up with Dr. Sherene Sires.

## 2012-03-27 ENCOUNTER — Ambulatory Visit (INDEPENDENT_AMBULATORY_CARE_PROVIDER_SITE_OTHER): Payer: Self-pay | Admitting: Internal Medicine

## 2012-03-27 ENCOUNTER — Encounter: Payer: Self-pay | Admitting: Internal Medicine

## 2012-03-27 VITALS — BP 110/68 | HR 76 | Temp 97.7°F | Ht 67.0 in | Wt 115.0 lb

## 2012-03-27 DIAGNOSIS — J449 Chronic obstructive pulmonary disease, unspecified: Secondary | ICD-10-CM

## 2012-03-27 DIAGNOSIS — Z87891 Personal history of nicotine dependence: Secondary | ICD-10-CM

## 2012-03-27 DIAGNOSIS — J9610 Chronic respiratory failure, unspecified whether with hypoxia or hypercapnia: Secondary | ICD-10-CM

## 2012-03-27 LAB — PULMONARY FUNCTION TEST

## 2012-03-27 NOTE — Progress Notes (Signed)
PFT done today. 

## 2012-03-27 NOTE — Patient Instructions (Addendum)
Atrovent (green tip) to take 2 puffs after symbicort and a total of 2 puffs 4x daily (2puffs bfast, lunch, supper and bedtime) as a maintenance   Just use the 02 at bedtime - 2lpm should be enough  Please schedule a follow up visit in 3 months but call sooner if needed

## 2012-03-27 NOTE — Progress Notes (Signed)
Subjective:     Patient ID: Heidi [name], female   DOB: 1956/04/12  MRN: [id number]  HPI 27 yowf quit smoking 01/23/2012 at [facility] with aecopd  With GOLD III COPD superimposed on chronic doe since around 2008 previously controlled with prn inhalers like combivent but since 2011 using these daily.   Admit date: 01/23/2012  D/C date: 01/25/2012   Primary Discharge Diagnoses:  1. NSTEMI secondary to possible Takotsubo cardiomyopathy  - EF 30-35% by echo this admission  - no evidence for obstructive CAD by cath 01/23/12  2. Acute respiratory failure prior to admission  3. Hypotension, limiting medication titration  4. Severe COPD   Admit date: 01/31/2012  Discharge date: 02/02/2012  Primary Care Physician: Lehman Prom, NP, NP  Discharge Diagnoses:  Active Problems:  TOBACCO ABUSE  COPD  Hx of MI < 8 weeks  Takotsubo cardiomyopathy    02/26/2012 1st pulmonary ov/ Heidi [name] post hosp doe not back to baseline = Oct no 02 doe x [employer] but didn't need 02 and supposed to be on symbicort since discharge  but hand not used it on day of ov and doe x [employer]  and over using her neb and hfa's.  No cough or purulent sputum. rec Symbicort 160 Take 2 puffs first thing in am and then another 2 puffs about 12 hours later.  Only use your albuterol/atrovent  as a rescue medication to be used if you can't catch your breath by resting or doing a relaxed purse lip breathing pattern. The less you use it, the better it will work when you need it. -  Use nebulizer to back up the inhalers  Please schedule a follow up office visit in 4 weeks, sooner if needed with PFTs  03/27/2012 f/u ov/Heidi [name] not smoking cc breathing better likes ventolin better proventil, has not been able to tolerate spiriva in past, no trouble with atrovent. No cough. Present doe x > slow walking flat maybe 200 ft off 02.  No change on 02, not much change p saba or daytime need.  Sleeping ok without nocturnal  or early am  exacerbation  of respiratory  c/o's or need for noct saba. Also denies any obvious fluctuation of symptoms with weather or environmental changes or other aggravating or alleviating factors except as outlined above   ROS  At present neg for  any significant sore throat, dysphagia, dental problems, itching, sneezing,  nasal congestion or excess/ purulent secretions, ear ache,   fever, chills, sweats, unintended wt loss, pleuritic or exertional cp, hemoptysis, palpitations, orthopnea pnd or leg swelling.  Also denies presyncope, palpitations, heartburn, abdominal pain, anorexia, nausea, vomiting, diarrhea  or change in bowel or urinary habits, change in stools or urine, dysuria,hematuria,  rash, arthralgias, visual complaints, headache, numbness weakness or ataxia or problems with walking or coordination. No noted change in mood/affect or memory.                        Objective:   Physical Exam  Anxious thin wf nad  Wt 114 02/26/2012 > 03/27/2012  115  HEENT mild turbinate edema.  Oropharynx no thrush or excess pnd or cobblestoning.  No JVD or cervical adenopathy. Mild accessory muscle hypertrophy. Trachea midline, nl thryroid. Chest was hyperinflated by percussion with diminished breath sounds and moderate increased exp time without wheeze. Hoover sign positive at mid inspiration. Regular rate and rhythm without murmur gallop or rub or increase P2 or edema.  Abd: no hsm, nl excursion. Ext warm without cyanosis or clubbing.    cxr 01/31/12 Findings: Biapical pleural thickening without associated bony  destruction unchanged. Chronic lung changes stable. Pulmonary  vascular prominence unchanged. No segmental infiltrate or  pneumothorax. Heart size within normal limits.  IMPRESSION:  No acute abnormality. Please see above.     Assessment:          Plan:

## 2012-04-01 NOTE — Assessment & Plan Note (Signed)
-   HFA 75%  03/27/2012      - PFT's 03/27/2012 FEV1 0.72 ( 27%) ratio 45 and 18 % better p B2, DLCO 48 corrects to 79%  GOLD IV but not 02 dep with exertion at this point, probably because her generalized debilitation stops her first - since can't tolerate spiriva will try atrovent qid and even maybe tudorza if not happy with atrovent.   The proper method of use, as well as anticipated side effects, of a metered-dose inhaler are discussed and demonstrated to the patient. Improved effectiveness after extensive coaching during this visit to a level of approximately  75%

## 2012-04-01 NOTE — Assessment & Plan Note (Addendum)
-   03/27/2012   Walked RA x one lap @ 185 stopped due to  Fatigue / lightheaded  but no sob or desat  Therefore conditioning probably a bigger factor and only needs to wear 02 2lpm at this point

## 2012-05-07 ENCOUNTER — Encounter: Payer: Self-pay | Admitting: Cardiology

## 2012-05-07 ENCOUNTER — Ambulatory Visit (INDEPENDENT_AMBULATORY_CARE_PROVIDER_SITE_OTHER): Payer: Self-pay | Admitting: Cardiology

## 2012-05-07 VITALS — BP 121/76 | HR 84 | Ht 67.0 in | Wt 116.0 lb

## 2012-05-07 DIAGNOSIS — I428 Other cardiomyopathies: Secondary | ICD-10-CM

## 2012-05-07 DIAGNOSIS — I5181 Takotsubo syndrome: Secondary | ICD-10-CM

## 2012-05-07 DIAGNOSIS — Z87891 Personal history of nicotine dependence: Secondary | ICD-10-CM

## 2012-05-07 DIAGNOSIS — I429 Cardiomyopathy, unspecified: Secondary | ICD-10-CM

## 2012-05-07 DIAGNOSIS — J449 Chronic obstructive pulmonary disease, unspecified: Secondary | ICD-10-CM

## 2012-05-07 NOTE — Patient Instructions (Addendum)
Your physician recommends that you schedule a follow-up appointment in: 3 MONTHS WITH DR CRENSHAW  Your physician has requested that you have an echocardiogram. Echocardiography is a painless test that uses sound waves to create images of your heart. It provides your doctor with information about the size and shape of your heart and how well your heart's chambers and valves are working. This procedure takes approximately one hour. There are no restrictions for this procedure.    

## 2012-05-07 NOTE — Assessment & Plan Note (Signed)
Plan continue aspirin and ARB. She is not on a beta blocker because of severe COPD. Repeat echocardiogram. Hopefully LV function will have improved.

## 2012-05-07 NOTE — Progress Notes (Signed)
HPI: Pleasant female with no prior cardiac history that presented to the hospital with non-ST elevation MI secondary to possible Takotsubo cardiomyopathy. Patient presented with chest pain, ECG changes and elevated cardiac markers. Cardiac catheterization revealed no obstructive coronary disease, ejection fraction 45% and wall motion felt consistent with Takotsubo CM. Echocardiogram showed an ejection fraction of 30-35% with diffuse hypokinesis. She also had acute respiratory failure on admission the setting of severe COPD. She was last seen in May of 2013. Since then, she does have dyspnea on exertion from COPD. There is no orthopnea, PND, exertional chest pain or syncope. Occasional mild pedal edema.    Current Outpatient Prescriptions  Medication Sig Dispense Refill  . albuterol (PROVENTIL HFA;VENTOLIN HFA) 108 (90 BASE) MCG/ACT inhaler Inhale 2 puffs into the lungs every 6 (six) hours as needed. Shortness of breath      . albuterol (PROVENTIL) (2.5 MG/3ML) 0.083% nebulizer solution Take 3 mLs (2.5 mg total) by nebulization every 4 (four) hours as needed for wheezing or shortness of breath.  360 mL  3  . aspirin EC 81 MG EC tablet Take 1 tablet (81 mg total) by mouth daily.      . budesonide-formoterol (SYMBICORT) 160-4.5 MCG/ACT inhaler Inhale 2 puffs into the lungs 2 (two) times daily.        . citalopram (CELEXA) 20 MG tablet Take 20 mg by mouth daily.      Marland Kitchen ipratropium (ATROVENT) 0.02 % nebulizer solution Take 0.5 mg by nebulization every 4 (four) hours as needed.      Marland Kitchen losartan (COZAAR) 25 MG tablet Take 1 tablet (25 mg total) by mouth daily.  30 tablet  6  . OXYGEN-HELIUM IN Inhale into the lungs. 3 lpm at night and as needed during the day      . sodium chloride (OCEAN) 0.65 % SOLN nasal spray Place 1 spray into the nose as needed for congestion.  1 Bottle       Past Medical History  Diagnosis Date  . COPD (chronic obstructive pulmonary disease)   . NSTEMI (non-ST elevated  myocardial infarction)     12/2011 with normal coronaries possibly secondary to Takotsubo (EF 30-35% by echo), occurred following two episodes of acute respiratory distress  . Hypotension     limiting med titration    Past Surgical History  Procedure Date  . Abdominal hysterectomy   . Vagina rectal repair     after childbirth    History   Social History  . Marital Status: Divorced    Spouse Name: N/A    Number of Children: N/A  . 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 Smoking status: Former Smoker -- 30 years    Types: Cigarettes    Quit date: 01/21/2012  . Smokeless tobacco: Never Used   Comment: smoked 1 ppd X 30 years, has recently cut down to 3 packs per week; has quit smoking entirely X 2 days  . Alcohol Use: Yes     1-2 beers per week  . Drug Use: No  . Sexually Active: Not Currently   Other Topics Concern  . Not on file   Social History Narrative   Ms. 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 She has two children, a daughter and son, who live in [place] area. She lives with her brother.    ROS: no fevers or chills,  productive cough, hemoptysis, dysphasia, odynophagia, melena, hematochezia, dysuria, hematuria, rash, seizure activity, orthopnea, PND, pedal edema, claudication. Remaining systems are negative.  Physical Exam: Well-developed well-nourished in no acute distress.  Skin is warm and dry.  HEENT is normal.  Neck is supple.  Chest with diminished breath sounds throughout and mild expiratory wheeze. Cardiovascular exam is regular rate and rhythm.  Abdominal exam nontender or distended. No masses palpated. Extremities show no edema. neuro grossly intact

## 2012-05-07 NOTE — Assessment & Plan Note (Signed)
Management per pulmonary. 

## 2012-05-07 NOTE — Assessment & Plan Note (Signed)
Patient has discontinued her tobacco use.

## 2012-05-14 ENCOUNTER — Other Ambulatory Visit: Payer: Self-pay

## 2012-05-14 ENCOUNTER — Ambulatory Visit (HOSPITAL_COMMUNITY): Payer: Self-pay | Attending: Cardiovascular Disease | Admitting: Radiology

## 2012-05-14 DIAGNOSIS — J449 Chronic obstructive pulmonary disease, unspecified: Secondary | ICD-10-CM | POA: Insufficient documentation

## 2012-05-14 DIAGNOSIS — Z87891 Personal history of nicotine dependence: Secondary | ICD-10-CM | POA: Insufficient documentation

## 2012-05-14 DIAGNOSIS — I429 Cardiomyopathy, unspecified: Secondary | ICD-10-CM

## 2012-05-14 DIAGNOSIS — I5181 Takotsubo syndrome: Secondary | ICD-10-CM | POA: Insufficient documentation

## 2012-05-14 DIAGNOSIS — J4489 Other specified chronic obstructive pulmonary disease: Secondary | ICD-10-CM | POA: Insufficient documentation

## 2012-05-14 DIAGNOSIS — R0989 Other specified symptoms and signs involving the circulatory and respiratory systems: Secondary | ICD-10-CM | POA: Insufficient documentation

## 2012-05-14 DIAGNOSIS — R0609 Other forms of dyspnea: Secondary | ICD-10-CM | POA: Insufficient documentation

## 2012-05-14 NOTE — Progress Notes (Signed)
Echocardiogram performed.  

## 2012-06-27 ENCOUNTER — Ambulatory Visit (INDEPENDENT_AMBULATORY_CARE_PROVIDER_SITE_OTHER): Payer: Self-pay | Admitting: Internal Medicine

## 2012-06-27 ENCOUNTER — Encounter: Payer: Self-pay | Admitting: Internal Medicine

## 2012-06-27 VITALS — BP 110/60 | HR 92 | Temp 98.5°F | Ht 66.0 in | Wt 117.8 lb

## 2012-06-27 DIAGNOSIS — J449 Chronic obstructive pulmonary disease, unspecified: Secondary | ICD-10-CM

## 2012-06-27 NOTE — Patient Instructions (Addendum)
Plan A Symbicort 160 Take 2 puffs first thing in am and then another 2 puffs about 12 hours later.              Atrovent either the puffer or neb is used 4 x daily (ok to use right after symbicort)   Plan B if you can't catch your breath after Plan A is being done correctly and consistently, then ok to use albuterol in hfa/puffer/inhaler  Plan C if Plan B not working and still can't catch your breath, ok to use the nebulizer up to every 3 hours  Plan D = Call the Doctor  PLan E = ER  Please schedule a follow up office visit in 6 weeks, call sooner if needed

## 2012-06-27 NOTE — Progress Notes (Signed)
Subjective:     Patient ID: Heidi [name], female   DOB: 04-20-1956  MRN: [id number]  HPI 56yowf quit smoking 01/23/2012 at [facility] with [facility]ecopd  With GOLD III COPD superimposed on chronic doe since around 2008 previously controlled with prn inhalers like combivent but since 2011 using these daily.   Admit date: 01/23/2012  D/C date: 01/25/2012   Primary Discharge Diagnoses:  1. NSTEMI secondary to possible Takotsubo cardiomyopathy  - EF 30-35% by echo this admission  - no evidence for obstructive CAD by cath 01/23/12  2. Acute respiratory failure prior to admission  3. Hypotension, limiting medication titration  4. Severe COPD   Admit date: 01/31/2012  Discharge date: 02/02/2012  Primary Care Physician: Lehman Prom, NP, NP  Discharge Diagnoses:  Active Problems:  TOBACCO ABUSE  COPD  Hx of MI < 8 weeks  Takotsubo cardiomyopathy    02/26/2012 1st pulmonary ov/ Heidi [name] post hosp doe not back to baseline = Oct no 02 doe x [employer] but didn't need 02 and supposed to be on symbicort since discharge  but hand not used it on day of ov and doe x [employer]  and over using her neb and hfa's.  No cough or purulent sputum. rec Symbicort 160 Take 2 puffs first thing in am and then another 2 puffs about 12 hours later.  Only use your albuterol/atrovent  as a rescue medication to be used if you can't catch your breath by resting or doing a relaxed purse lip breathing pattern. The less you use it, the better it will work when you need it. -  Use nebulizer to back up the inhalers    03/27/2012 f/u ov/Heidi [name] not smoking cc breathing better likes ventolin better proventil, has not been able to tolerate spiriva in past, no trouble with atrovent. No cough. Present doe x > slow walking flat maybe 200 ft off 02.  No change on 02, not much change p saba or daytime need. rec Atrovent (green tip) to take 2 puffs after symbicort and a total of 2 puffs 4x daily (2puffs bfast, lunch, supper and  bedtime) as a maintenance   Just use the 02 at bedtime - 2lpm should be enough  06/27/2012 f/u ov/Heidi [name] thoroughly confused with last set of instructions mixing maint and prns, nebs and hfa. No change doe.  No unusual cough, purulent sputum or sinus/hb symptoms on present rx.     Sleeping ok without nocturnal  or early am exacerbation  of respiratory  c/o's or need for noct saba. Also denies any obvious fluctuation of symptoms with weather or environmental changes or other aggravating or alleviating factors except as outlined above   ROS  At present neg for  any significant sore throat, dysphagia, dental problems, itching, sneezing,  nasal congestion or excess/ purulent secretions, ear ache,   fever, chills, sweats, unintended wt loss, pleuritic or exertional cp, hemoptysis, palpitations, orthopnea pnd or leg swelling.  Also denies presyncope, palpitations, heartburn, abdominal pain, anorexia, nausea, vomiting, diarrhea  or change in bowel or urinary habits, change in stools or urine, dysuria,hematuria,  rash, arthralgias, visual complaints, headache, numbness weakness or ataxia or problems with walking or coordination. No noted change in mood/affect or memory.                        Objective:   Physical Exam  Anxious thin wf nad  Wt 114 02/26/2012 > 03/27/2012  115 > 06/27/2012  117  HEENT mild turbinate edema.  Oropharynx no thrush or excess pnd or cobblestoning.  No JVD or cervical adenopathy. Mild accessory muscle hypertrophy. Trachea midline, nl thryroid. Chest was hyperinflated by percussion with diminished breath sounds and moderate increased exp time without wheeze. Hoover sign positive at mid inspiration. Regular rate and rhythm without murmur gallop or rub or increase P2 or edema.  Abd: no hsm, nl excursion. Ext warm without cyanosis or clubbing.    cxr 01/31/12 Findings: Biapical pleural thickening without associated bony  destruction unchanged. Chronic lung changes stable.  Pulmonary  vascular prominence unchanged. No segmental infiltrate or  pneumothorax. Heart size within normal limits.  IMPRESSION:  No acute abnormality. Please see above.     Assessment:          Plan:

## 2012-06-27 NOTE — Assessment & Plan Note (Addendum)
-   HFA 75%  06/27/2012     - PFT's 03/27/2012 FEV1 0.72 ( 27%) ratio 45 and 18 % better p B2, DLCO 48 corrects to 79%  GOLD IV with previous intol to spiriva so rec symbicort 160 2bid and atrovent qid (either hfa or neb but not both)  I had an extended discussion with the patient today lasting 15 to 20 minutes of a 25 minute visit on the following issues:    Each maintenance medication was reviewed in detail including most importantly the difference between maintenance and as needed and under what circumstances the prns are to be used.  Please see instructions for details which were reviewed in writing and the patient given a copy.   The proper method of use, as well as anticipated side effects, of a metered-dose inhaler are discussed and demonstrated to the patient. Improved effectiveness after extensive coaching during this visit to a level of approximately  75%.

## 2012-07-16 ENCOUNTER — Telehealth: Payer: Self-pay | Admitting: Internal Medicine

## 2012-07-16 MED ORDER — ALBUTEROL SULFATE HFA 108 (90 BASE) MCG/ACT IN AERS: 2.0000 | INHALATION_SPRAY | Freq: Four times a day (QID) | RESPIRATORY_TRACT | Status: DC | PRN

## 2012-07-16 NOTE — Telephone Encounter (Signed)
Rx was refilled. Spoke with pt and notified that this was done. 

## 2012-08-08 ENCOUNTER — Ambulatory Visit: Payer: Self-pay | Admitting: Cardiology

## 2012-08-08 ENCOUNTER — Inpatient Hospital Stay (HOSPITAL_COMMUNITY)
Admission: EM | Admit: 2012-08-08 | Discharge: 2012-08-13 | DRG: 208 | Disposition: A | Discharge status: Home / Self Care | Payer: Medicaid Other | Attending: Pulmonary Disease | Admitting: Pulmonary Disease

## 2012-08-08 ENCOUNTER — Encounter (HOSPITAL_COMMUNITY): Payer: Self-pay

## 2012-08-08 ENCOUNTER — Inpatient Hospital Stay (HOSPITAL_COMMUNITY): Payer: Medicaid Other

## 2012-08-08 ENCOUNTER — Emergency Department (HOSPITAL_COMMUNITY): Payer: Medicaid Other

## 2012-08-08 ENCOUNTER — Encounter: Payer: Self-pay | Admitting: Internal Medicine

## 2012-08-08 DIAGNOSIS — E872 Acidosis, unspecified: Secondary | ICD-10-CM

## 2012-08-08 DIAGNOSIS — IMO0002 Reserved for concepts with insufficient information to code with codable children: Secondary | ICD-10-CM

## 2012-08-08 DIAGNOSIS — I9589 Other hypotension: Secondary | ICD-10-CM | POA: Diagnosis not present

## 2012-08-08 DIAGNOSIS — F3289 Other specified depressive episodes: Secondary | ICD-10-CM

## 2012-08-08 DIAGNOSIS — M949 Disorder of cartilage, unspecified: Secondary | ICD-10-CM

## 2012-08-08 DIAGNOSIS — L989 Disorder of the skin and subcutaneous tissue, unspecified: Secondary | ICD-10-CM

## 2012-08-08 DIAGNOSIS — R87610 Atypical squamous cells of undetermined significance on cytologic smear of cervix (ASC-US): Secondary | ICD-10-CM

## 2012-08-08 DIAGNOSIS — R3129 Other microscopic hematuria: Secondary | ICD-10-CM

## 2012-08-08 DIAGNOSIS — J069 Acute upper respiratory infection, unspecified: Secondary | ICD-10-CM

## 2012-08-08 DIAGNOSIS — Z9079 Acquired absence of other genital organ(s): Secondary | ICD-10-CM

## 2012-08-08 DIAGNOSIS — E538 Deficiency of other specified B group vitamins: Secondary | ICD-10-CM

## 2012-08-08 DIAGNOSIS — F329 Major depressive disorder, single episode, unspecified: Secondary | ICD-10-CM

## 2012-08-08 DIAGNOSIS — J9600 Acute respiratory failure, unspecified whether with hypoxia or hypercapnia: Principal | ICD-10-CM

## 2012-08-08 DIAGNOSIS — Z87891 Personal history of nicotine dependence: Secondary | ICD-10-CM

## 2012-08-08 DIAGNOSIS — J9610 Chronic respiratory failure, unspecified whether with hypoxia or hypercapnia: Secondary | ICD-10-CM

## 2012-08-08 DIAGNOSIS — R209 Unspecified disturbances of skin sensation: Secondary | ICD-10-CM

## 2012-08-08 DIAGNOSIS — M899 Disorder of bone, unspecified: Secondary | ICD-10-CM

## 2012-08-08 DIAGNOSIS — I252 Old myocardial infarction: Secondary | ICD-10-CM

## 2012-08-08 DIAGNOSIS — J441 Chronic obstructive pulmonary disease with (acute) exacerbation: Secondary | ICD-10-CM

## 2012-08-08 DIAGNOSIS — D649 Anemia, unspecified: Secondary | ICD-10-CM | POA: Diagnosis present

## 2012-08-08 DIAGNOSIS — Z7982 Long term (current) use of aspirin: Secondary | ICD-10-CM

## 2012-08-08 DIAGNOSIS — J449 Chronic obstructive pulmonary disease, unspecified: Secondary | ICD-10-CM

## 2012-08-08 DIAGNOSIS — I5181 Takotsubo syndrome: Secondary | ICD-10-CM

## 2012-08-08 DIAGNOSIS — R87811 Vaginal high risk human papillomavirus (HPV) DNA test positive: Secondary | ICD-10-CM

## 2012-08-08 DIAGNOSIS — R092 Respiratory arrest: Secondary | ICD-10-CM

## 2012-08-08 DIAGNOSIS — Z79899 Other long term (current) drug therapy: Secondary | ICD-10-CM

## 2012-08-08 DIAGNOSIS — G9340 Encephalopathy, unspecified: Secondary | ICD-10-CM

## 2012-08-08 LAB — URINALYSIS, MICROSCOPIC ONLY
Bilirubin Urine: NEGATIVE
Glucose, UA: NEGATIVE mg/dL
Ketones, ur: NEGATIVE mg/dL
Leukocytes, UA: NEGATIVE
Nitrite: NEGATIVE
Protein, ur: >300 mg/dL — AB
Specific Gravity, Urine: 1.018 (ref 1.005–1.030)
Urobilinogen, UA: 0.2 mg/dL (ref 0.0–1.0)
pH: 5 (ref 5.0–8.0)

## 2012-08-08 LAB — CBC
HCT: 34.9 % — ABNORMAL LOW (ref 36.0–46.0)
Hemoglobin: 11.8 g/dL — ABNORMAL LOW (ref 12.0–15.0)
MCH: 34.2 pg — ABNORMAL HIGH (ref 26.0–34.0)
MCH: 34.2 pg — ABNORMAL HIGH (ref 26.0–34.0)
MCHC: 33.8 g/dL (ref 30.0–36.0)
MCHC: 34.9 g/dL (ref 30.0–36.0)
MCV: 101.2 fL — ABNORMAL HIGH (ref 78.0–100.0)
MCV: 98 fL (ref 78.0–100.0)
Platelets: 219 10*3/uL (ref 150–400)
Platelets: 253 10*3/uL (ref 150–400)
RBC: 3.45 MIL/uL — ABNORMAL LOW (ref 3.87–5.11)
RDW: 12.6 % (ref 11.5–15.5)
WBC: 8.6 10*3/uL (ref 4.0–10.5)

## 2012-08-08 LAB — TROPONIN I
Troponin I: <0.3 ng/mL (ref <0.30)
Troponin I: 0.66 ng/mL (ref <0.30)
Troponin I: 1.46 ng/mL (ref <0.30)

## 2012-08-08 LAB — BASIC METABOLIC PANEL
BUN: 12 mg/dL (ref 6–23)
CO2: 15 mEq/L — ABNORMAL LOW (ref 19–32)
Calcium: 8.4 mg/dL (ref 8.4–10.5)
Chloride: 100 mEq/L (ref 96–112)
Creatinine, Ser: 0.87 mg/dL (ref 0.50–1.10)
GFR calc Af Amer: 85 mL/min — ABNORMAL LOW (ref >90)
GFR calc non Af Amer: 73 mL/min — ABNORMAL LOW (ref >90)
Glucose, Bld: 212 mg/dL — ABNORMAL HIGH (ref 70–99)
Potassium: 4.1 mEq/L (ref 3.5–5.1)
Sodium: 135 mEq/L (ref 135–145)

## 2012-08-08 LAB — COMPREHENSIVE METABOLIC PANEL
ALT: 48 U/L — ABNORMAL HIGH (ref 0–35)
AST: 72 U/L — ABNORMAL HIGH (ref 0–37)
Calcium: 7.8 mg/dL — ABNORMAL LOW (ref 8.4–10.5)
Creatinine, Ser: 0.54 mg/dL (ref 0.50–1.10)
GFR calc Af Amer: >90 mL/min (ref >90)
Sodium: 138 mEq/L (ref 135–145)
Total Protein: 6.3 g/dL (ref 6.0–8.3)

## 2012-08-08 LAB — POCT I-STAT 3, ART BLOOD GAS (G3+)
Acid-base deficit: 9 mmol/L — ABNORMAL HIGH (ref 0.0–2.0)
Acid-base deficit: 1 mmol/L (ref 0.0–2.0)
Bicarbonate: 18.4 mEq/L — ABNORMAL LOW (ref 20.0–24.0)
Bicarbonate: 25.1 mEq/L — ABNORMAL HIGH (ref 20.0–24.0)
O2 Saturation: 100 %
O2 Saturation: 99 %
Patient temperature: 98.6
Patient temperature: 98.6
TCO2: 20 mmol/L (ref 0–100)
TCO2: 27 mmol/L (ref 0–100)
pCO2 arterial: 46.5 mmHg — ABNORMAL HIGH (ref 35.0–45.0)
pCO2 arterial: 47.3 mmHg — ABNORMAL HIGH (ref 35.0–45.0)
pH, Arterial: 7.206 — ABNORMAL LOW (ref 7.350–7.450)
pH, Arterial: 7.332 — ABNORMAL LOW (ref 7.350–7.450)
pO2, Arterial: 612 mmHg — ABNORMAL HIGH (ref 80.0–100.0)
pO2, Arterial: 168 mmHg — ABNORMAL HIGH (ref 80.0–100.0)

## 2012-08-08 LAB — LEGIONELLA ANTIGEN, URINE: Legionella Antigen, Urine: NEGATIVE

## 2012-08-08 LAB — ETHANOL: Alcohol, Ethyl (B): <11 mg/dL (ref 0–11)

## 2012-08-08 LAB — LACTIC ACID, PLASMA
Lactic Acid, Venous: 7.3 mmol/L — ABNORMAL HIGH (ref 0.5–2.2)
Lactic Acid, Venous: 1 mmol/L (ref 0.5–2.2)
Lactic Acid, Venous: 0.9 mmol/L (ref 0.5–2.2)

## 2012-08-08 LAB — GLUCOSE, CAPILLARY
Glucose-Capillary: 189 mg/dL — ABNORMAL HIGH (ref 70–99)
Glucose-Capillary: 198 mg/dL — ABNORMAL HIGH (ref 70–99)
Glucose-Capillary: 139 mg/dL — ABNORMAL HIGH (ref 70–99)
Glucose-Capillary: 130 mg/dL — ABNORMAL HIGH (ref 70–99)
Glucose-Capillary: 120 mg/dL — ABNORMAL HIGH (ref 70–99)

## 2012-08-08 LAB — MRSA PCR SCREENING: MRSA by PCR: NEGATIVE

## 2012-08-08 LAB — PROTIME-INR
INR: 1.01 (ref 0.00–1.49)
Prothrombin Time: 13.2 seconds (ref 11.6–15.2)

## 2012-08-08 MED ORDER — METHYLPREDNISOLONE SODIUM SUCC 125 MG IJ SOLR
125.0000 mg | Freq: Once | INTRAMUSCULAR | Status: AC
  Administered 2012-08-08: 125 mg via INTRAVENOUS
  Filled 2012-08-08: qty 2

## 2012-08-08 MED ORDER — METRONIDAZOLE IN NACL 5-0.79 MG/ML-% IV SOLN: 500.0000 mg | Freq: Once | INTRAVENOUS | Status: DC

## 2012-08-08 MED ORDER — SODIUM CHLORIDE 0.9 % IV BOLUS (SEPSIS): 500.0000 mL | Freq: Once | INTRAVENOUS | Status: DC

## 2012-08-08 MED ORDER — FENTANYL CITRATE 0.05 MG/ML IJ SOLN
100.0000 ug | Freq: Once | INTRAMUSCULAR | Status: AC
  Administered 2012-08-08: 100 ug via INTRAVENOUS

## 2012-08-08 MED ORDER — ALBUTEROL SULFATE (5 MG/ML) 0.5% IN NEBU
INHALATION_SOLUTION | RESPIRATORY_TRACT | Status: AC
  Filled 2012-08-08: qty 0.5

## 2012-08-08 MED ORDER — VANCOMYCIN HCL 10 G IV SOLR: 1.0000 g | Freq: Once | INTRAVENOUS | Status: DC

## 2012-08-08 MED ORDER — IPRATROPIUM BROMIDE 0.02 % IN SOLN
0.5000 mg | Freq: Four times a day (QID) | RESPIRATORY_TRACT | Status: DC
  Administered 2012-08-08 – 2012-08-12 (×17): 0.5 mg via RESPIRATORY_TRACT
  Filled 2012-08-08 (×16): qty 2.5

## 2012-08-08 MED ORDER — FENTANYL CITRATE 0.05 MG/ML IJ SOLN
50.0000 ug | Freq: Once | INTRAMUSCULAR | Status: AC
  Administered 2012-08-08: 50 ug via INTRAVENOUS

## 2012-08-08 MED ORDER — LEVOFLOXACIN IN D5W 750 MG/150ML IV SOLN
750.0000 mg | Freq: Every day | INTRAVENOUS | Status: DC
  Administered 2012-08-08 – 2012-08-09 (×2): 750 mg via INTRAVENOUS
  Filled 2012-08-08 (×2): qty 150

## 2012-08-08 MED ORDER — ALBUTEROL SULFATE (5 MG/ML) 0.5% IN NEBU
2.5000 mg | INHALATION_SOLUTION | Freq: Four times a day (QID) | RESPIRATORY_TRACT | Status: DC
  Administered 2012-08-08 – 2012-08-12 (×17): 2.5 mg via RESPIRATORY_TRACT
  Filled 2012-08-08 (×16): qty 0.5

## 2012-08-08 MED ORDER — DOPAMINE-DEXTROSE 3.2-5 MG/ML-% IV SOLN
2.0000 ug/kg/min | Freq: Once | INTRAVENOUS | Status: AC
  Administered 2012-08-08: 5 ug/kg/min via INTRAVENOUS

## 2012-08-08 MED ORDER — DOPAMINE-DEXTROSE 3.2-5 MG/ML-% IV SOLN: 2.0000 ug/kg/min | INTRAVENOUS | Status: DC

## 2012-08-08 MED ORDER — BIOTENE DRY MOUTH MT LIQD
1.0000 "application " | Freq: Four times a day (QID) | OROMUCOSAL | Status: DC
  Administered 2012-08-08 (×3): 15 mL via OROMUCOSAL

## 2012-08-08 MED ORDER — HEPARIN SODIUM (PORCINE) 5000 UNIT/ML IJ SOLN
5000.0000 [IU] | Freq: Three times a day (TID) | INTRAMUSCULAR | Status: DC
  Administered 2012-08-08 – 2012-08-13 (×15): 5000 [IU] via SUBCUTANEOUS
  Filled 2012-08-08 (×19): qty 1

## 2012-08-08 MED ORDER — LORAZEPAM 2 MG/ML IJ SOLN
2.0000 mg | Freq: Once | INTRAMUSCULAR | Status: AC
  Administered 2012-08-08: 2 mg via INTRAVENOUS

## 2012-08-08 MED ORDER — PROPOFOL 10 MG/ML IV EMUL
5.0000 ug/kg/min | Freq: Once | INTRAVENOUS | Status: AC
  Administered 2012-08-08: 5 ug/kg/min via INTRAVENOUS

## 2012-08-08 MED ORDER — SUCCINYLCHOLINE CHLORIDE 20 MG/ML IJ SOLN
120.0000 mg | Freq: Once | INTRAMUSCULAR | Status: AC
  Administered 2012-08-08: 120 mg via INTRAVENOUS

## 2012-08-08 MED ORDER — ETOMIDATE 2 MG/ML IV SOLN
20.0000 mg | Freq: Once | INTRAVENOUS | Status: AC
  Administered 2012-08-08: 20 mg via INTRAVENOUS

## 2012-08-08 MED ORDER — FENTANYL CITRATE 0.05 MG/ML IJ SOLN
INTRAMUSCULAR | Status: AC
  Administered 2012-08-08: 50 ug via INTRAVENOUS
  Filled 2012-08-08: qty 2

## 2012-08-08 MED ORDER — MIDAZOLAM HCL 2 MG/2ML IJ SOLN
INTRAMUSCULAR | Status: AC
  Administered 2012-08-08: 2 mg
  Filled 2012-08-08: qty 2

## 2012-08-08 MED ORDER — INSULIN ASPART 100 UNIT/ML ~~LOC~~ SOLN
0.0000 [IU] | SUBCUTANEOUS | Status: DC
  Administered 2012-08-08 (×2): 2 [IU] via SUBCUTANEOUS

## 2012-08-08 MED ORDER — PANTOPRAZOLE SODIUM 40 MG IV SOLR
40.0000 mg | Freq: Every day | INTRAVENOUS | Status: DC
  Administered 2012-08-08 – 2012-08-09 (×2): 40 mg via INTRAVENOUS
  Filled 2012-08-08 (×3): qty 40

## 2012-08-08 MED ORDER — PROPOFOL 10 MG/ML IV EMUL
INTRAVENOUS | Status: AC
  Administered 2012-08-08: 5 ug/kg/min via INTRAVENOUS
  Filled 2012-08-08: qty 100

## 2012-08-08 MED ORDER — DEXTROSE 5 % IV SOLN: 1.0000 g | Freq: Two times a day (BID) | INTRAVENOUS | Status: DC

## 2012-08-08 MED ORDER — IPRATROPIUM-ALBUTEROL 18-103 MCG/ACT IN AERO: 6.0000 | INHALATION_SPRAY | RESPIRATORY_TRACT | Status: DC | PRN

## 2012-08-08 MED ORDER — DOPAMINE-DEXTROSE 3.2-5 MG/ML-% IV SOLN
INTRAVENOUS | Status: AC
  Administered 2012-08-08: 5 ug/kg/min via INTRAVENOUS
  Filled 2012-08-08: qty 250

## 2012-08-08 MED ORDER — IPRATROPIUM-ALBUTEROL 18-103 MCG/ACT IN AERO
6.0000 | INHALATION_SPRAY | RESPIRATORY_TRACT | Status: DC
  Administered 2012-08-08 (×2): 6 via RESPIRATORY_TRACT
  Filled 2012-08-08: qty 14.7

## 2012-08-08 MED ORDER — IPRATROPIUM BROMIDE 0.02 % IN SOLN
RESPIRATORY_TRACT | Status: AC
  Filled 2012-08-08: qty 2.5

## 2012-08-08 MED ORDER — IPRATROPIUM BROMIDE 0.02 % IN SOLN
0.5000 mg | Freq: Once | RESPIRATORY_TRACT | Status: AC
  Administered 2012-08-08: 0.5 mg via RESPIRATORY_TRACT
  Filled 2012-08-08: qty 2.5

## 2012-08-08 MED ORDER — METHYLPREDNISOLONE SODIUM SUCC 125 MG IJ SOLR
80.0000 mg | Freq: Two times a day (BID) | INTRAMUSCULAR | Status: DC
  Administered 2012-08-08: 80 mg via INTRAVENOUS
  Filled 2012-08-08 (×2): qty 1.28

## 2012-08-08 MED ORDER — SODIUM CHLORIDE 0.9 % IV SOLN
1.0000 mg/h | INTRAVENOUS | Status: DC
  Administered 2012-08-08: 2 mg/h via INTRAVENOUS
  Filled 2012-08-08: qty 10

## 2012-08-08 MED ORDER — METHYLPREDNISOLONE SODIUM SUCC 125 MG IJ SOLR
60.0000 mg | Freq: Four times a day (QID) | INTRAMUSCULAR | Status: DC
  Administered 2012-08-08 – 2012-08-10 (×6): 60 mg via INTRAVENOUS
  Filled 2012-08-08 (×11): qty 0.96

## 2012-08-08 MED ORDER — SODIUM CHLORIDE 0.9 % IV SOLN
INTRAVENOUS | Status: DC
  Administered 2012-08-08: 100 mL/h via INTRAVENOUS
  Administered 2012-08-08: 05:00:00 via INTRAVENOUS
  Administered 2012-08-08 – 2012-08-09 (×2): 50 mL/h via INTRAVENOUS

## 2012-08-08 MED ORDER — INSULIN ASPART 100 UNIT/ML ~~LOC~~ SOLN
0.0000 [IU] | Freq: Three times a day (TID) | SUBCUTANEOUS | Status: DC
  Administered 2012-08-09: 2 [IU] via SUBCUTANEOUS
  Administered 2012-08-09: 3 [IU] via SUBCUTANEOUS
  Administered 2012-08-09: 2 [IU] via SUBCUTANEOUS
  Administered 2012-08-09 – 2012-08-10 (×2): 3 [IU] via SUBCUTANEOUS
  Administered 2012-08-10: 2 [IU] via SUBCUTANEOUS
  Administered 2012-08-10: 3 [IU] via SUBCUTANEOUS
  Administered 2012-08-10: 2 [IU] via SUBCUTANEOUS
  Administered 2012-08-11: 3 [IU] via SUBCUTANEOUS
  Administered 2012-08-11 – 2012-08-12 (×4): 2 [IU] via SUBCUTANEOUS

## 2012-08-08 MED ORDER — FENTANYL CITRATE 0.05 MG/ML IJ SOLN
25.0000 ug/h | INTRAMUSCULAR | Status: DC
  Administered 2012-08-08: 50 ug/h via INTRAVENOUS
  Filled 2012-08-08: qty 50

## 2012-08-08 MED ORDER — CHLORHEXIDINE GLUCONATE 0.12 % MT SOLN
15.0000 mL | Freq: Two times a day (BID) | OROMUCOSAL | Status: DC
  Administered 2012-08-08: 15 mL via OROMUCOSAL
  Filled 2012-08-08: qty 15

## 2012-08-08 MED ORDER — MIDAZOLAM HCL 10 MG/2ML IJ SOLN: 4.0000 mg | Freq: Once | INTRAMUSCULAR | Status: DC

## 2012-08-08 MED ORDER — ALBUTEROL SULFATE (5 MG/ML) 0.5% IN NEBU
2.5000 mg | INHALATION_SOLUTION | RESPIRATORY_TRACT | Status: DC | PRN
  Administered 2012-08-09 – 2012-08-13 (×3): 2.5 mg via RESPIRATORY_TRACT
  Filled 2012-08-08 (×3): qty 0.5

## 2012-08-08 MED ORDER — ALBUTEROL SULFATE (5 MG/ML) 0.5% IN NEBU
10.0000 mg | INHALATION_SOLUTION | Freq: Once | RESPIRATORY_TRACT | Status: AC
  Administered 2012-08-08: 10 mg via RESPIRATORY_TRACT
  Filled 2012-08-08: qty 0.5

## 2012-08-08 NOTE — ED Notes (Signed)
Triple lumen placed to left IJ per Dr.Kohut; PCXR done for line placement

## 2012-08-08 NOTE — ED Notes (Signed)
Second liter of NS is infusing wide open

## 2012-08-08 NOTE — Progress Notes (Signed)
Inpatient Diabetes Program Recommendations  AACE/ADA: New Consensus Statement on Inpatient Glycemic Control (2013)  Target Ranges:  Prepandial:   less than 140 mg/dL      Peak postprandial:   less than 180 mg/dL (1-2 hours)      Critically ill patients:  140 - 180 mg/dL   Results for ELLYSIA, CHAR (MRN [id number]) as of 08/08/2012 13:25  Ref. Range 08/08/2012 00:35 08/08/2012 08:21  Glucose Latest Range: 70-99 mg/dL 409 (H) 811 (H)    Inpatient Diabetes Program Recommendations Correction (SSI): Elevated CBGs, request for CBGs Q4 hours and Novolog correction   Note: Pt is receiving Solumedrol 80mg  IV Q12 hours; has no history of DM.  However, blood sugars have been elevated.  Plan to request CBGs Q4 hours with Novolog correction.  Already ordered to start today at 12:00.  Will continue to follow. Orlando Penner, RN, BSN, CCRN Diabetes Coordinator

## 2012-08-08 NOTE — ED Notes (Signed)
Propofol drip held at this time d/t present hypotension; MD aware of same - pt sedated adequately at this time - tolerating vent well

## 2012-08-08 NOTE — ED Notes (Signed)
Second bolus of 500 cc NS hung to infuse w.o.

## 2012-08-08 NOTE — ED Notes (Signed)
Third bolus of 500 cc NS initiated

## 2012-08-08 NOTE — Progress Notes (Signed)
Pt brought in with EMS with sob and guppy breathing. MD deided to intubate due to desaturation and wob. Pt intubated with 7.5 ett on first insertion with glide scope. Tube secured at the lips @ 23cm.  Breath sounds, co2 detector and chest rise confirmed placement.

## 2012-08-08 NOTE — ED Notes (Signed)
1000 cc NS hung to infuse wide open per PIV to RFA per gravity for total of 2500 cc

## 2012-08-08 NOTE — ED Notes (Signed)
Attempted to call report - nurse had to be called in - not here yet - will call me upon his arrival per ICU secretary

## 2012-08-08 NOTE — ED Notes (Signed)
Bair Hugger placed on pt for core temp of 94.5; MD aware of same

## 2012-08-08 NOTE — ED Notes (Signed)
Arrived via EMS in resp distress; resp shallow at 12-14/min; guppy breathing; unresp. - will pull from painful stimuli; MD, RNs, EDTs, RT at bedside preparing for intubation; placed on ccm showing SR rate 88 without ectopy

## 2012-08-08 NOTE — Progress Notes (Signed)
Name: Heidi [name] MRN: [id number] DOB: 03/27/1956    LOS: 0  Referring Provider:  EDP Reason for Referral:  Acute respiratory failure  PULMONARY / CRITICAL CARE MEDICINE  HPI:  56 yo with COPD admitted 10/10 with acute respiratory distress requiring intubation.  Brief patient description:  56 yo with COPD admitted 10/10 with acute respiratory distress requiring intubation.  Events Since Admission: 10/10  Admitted with acute respiratory distress requiring intubation.  Current Status: Guarded  Subjective: Tolerating PSV, awake and following commands  Vital Signs: Temp:  [94.1 F (34.5 C)-98.7 F (37.1 C)] 98.3 F (36.8 C) (10/10 0900) Pulse Rate:  [72-116] 81  (10/10 0900) Resp:  [8-20] 17  (10/10 0900) BP: (68-139)/(21-81) 87/55 mmHg (10/10 0900) SpO2:  [94 %-100 %] 100 % (10/10 0900) FiO2 (%):  [40 %-100 %] 40 % (10/10 0840) Weight:  [53.4 kg (117 lb 11.6 oz)-55.6 kg (122 lb 9.2 oz)] 55.6 kg (122 lb 9.2 oz) (10/10 0400)  Physical Examination: General:  Ill appearing, older than stated age Neuro:  awake, nonfocal, cough / gag present HEENT:  PERRL Neck:  No JVD Cardiovascular:  RRR, no m/r/g Lungs:  Bilateral diminished air entry, mild expiratory wheezes Abdomen:  Soft, nontender, nondistended, bowel sounds present Musculoskeletal:  Moves allextremities Skin:  No rash  Active Problems:  Acute respiratory failure  Acute encephalopathy  COPD with acute exacerbation  Lactic acidosis  ASSESSMENT AND PLAN  PULMONARY  Lab 08/08/12 0101  PHART 7.206*  PCO2ART 46.5*  PO2ART 612.0*  HCO3 18.4*  O2SAT 100.0   Ventilator Settings: Vent Mode:  [-] PRVC FiO2 (%):  [40 %-100 %] 40 % Set Rate:  [16 bmp] 16 bmp Vt Set:  [440 mL-470 mL] 440 mL PEEP:  [5 cmH20] 5 cmH20 Plateau Pressure:  [22 cmH20-27 cmH20] 27 cmH20  CXR:  10/10  Hyperinflation, no overt airspace disease  ETT:  10/10 >>>  A:  AECOPD.  Acute respiratory failure. P:   Goal SpO2 > 92, pH >  7.30 Full mechanical support Follow CXR ABG now Daily SBT, possible extubation 10/10 Combivent Solu-Medrol  CARDIOVASCULAR  Lab 08/08/12 0830 08/08/12 0821 08/08/12 0306 08/08/12 0223 08/08/12 0105 08/08/12 0037  TROPONINI 1.10* -- -- 0.66* -- <0.30  LATICACIDVEN -- 1.0 3.3* -- 7.3* --  PROBNP -- -- -- -- -- --   ECG:  10/10 >>> Sinus tachycardia, no ST-T changes Lines: L IJ TLC 10/10 >>>  A: Transient hypotension after sedated for intubation.  No arrhythmia / ischemia.  Lactic acidosis, likley secondary to hypoxia, quickly improved. P:  Hold antihypertensives Goal MAP > 60 Trend troponin, consider cards eval depending on peak  Dopamine gtt, now titrated to off  RENAL  Lab 08/08/12 0821 08/08/12 0035  NA 138 135  K 3.9 4.1  CL 103 100  CO2 25 15*  BUN 7 12  CREATININE 0.54 0.87  CALCIUM 7.8* 8.4  MG -- --  PHOS -- --   Intake/Output      10/09 0701 - 10/10 0700 10/10 0701 - 10/11 0700   I.V. (mL/kg) 2956.4 (53.2) 110.5 (2)   IV Piggyback 150    Total Intake(mL/kg) 3106.4 (55.9) 110.5 (2)   Urine (mL/kg/hr) 2750 (2.1) 400   Total Output 2750 400   Net +356.4 -289.5         Foley:  10/10 >>>  A:  Normal renal function / electrolytes.  P:   Goal CVP 10 to12 INF NS 100 mL/h NS boluses to goal  CVP Trend BMP  GASTROINTESTINAL  Lab 08/08/12 0821  AST 72*  ALT 48*  ALKPHOS 99  BILITOT 0.4  PROT 6.3  ALBUMIN 3.6    A:  No active issues. P:   NPO as intubated TF if remains intubated > 24 h  HEMATOLOGIC  Lab 08/08/12 0821 08/08/12 0206 08/08/12 0035  HGB 12.0 -- 11.8*  HCT 34.4* -- 34.9*  PLT 219 -- 253  INR -- 1.01 --  APTT -- -- --   A:  Mild anemia. P:  Trend CBC  INFECTIOUS  Lab 08/08/12 0821 08/08/12 0300 08/08/12 0035  WBC 9.3 -- 8.6  PROCALCITON -- <0.10 --   Cultures: 10/10  Blood >>> 10/10  Respiratory >>> 10/10 legionella Ag >> negative 10/10 pneumococcal Ag >> negative  Antibiotics: Avelox 10/10 x 1 Flagyl 10/10 x  1 Vancomycin 10/10 x 1 Levaquin 10/10 >>>  A:  No clear source of infection. P:   Antibiotics / cultures as above PCT  ENDOCRINE  Lab 08/08/12 0748 08/08/12 0407  GLUCAP 198* 189*    A:  No active issues. P:   No interventions required.  NEUROLOGIC  Head CT:  10/10 >>> nad  A:  Acute encephalopathy. P:   Goal RASS 0 to -1 Daily WUA Fentanyl / versed gtt  BEST PRACTICE / DISPOSITION Level of Care:  ICU Primary Service:  PCCM Consultants:  None Code Status:  Full Diet:  NPO DVT Px:  Protonix GI Px:  Heparin Skin Integrity:  Intact Social / Family:  Family updated in ED  The patient is critically ill with multiple organ systems failure and requires high complexity decision making for assessment and support, frequent evaluation and titration of therapies, application of advanced monitoring technologies and extensive interpretation of multiple databases. Critical Care Time devoted to patient care services described in this note is 45 minutes (following Dr Marin Shutter)   Leslye Peer., M.D. Pulmonary and Critical Care Medicine [facility] Pager: [phone]  08/08/2012, 10:58 AM

## 2012-08-08 NOTE — ED Notes (Signed)
FIO2 decreased to 50% per RT

## 2012-08-08 NOTE — ED Notes (Signed)
May transport pt at 3:15 per ICU charge nurse

## 2012-08-08 NOTE — Progress Notes (Signed)
CRITICAL VALUE ALERT  Critical value received: troponin Date of notification:  08/07/12 Time of notification:  0358 Critical value read back:yes  Nurse who received alert: Rockwell Alexandria MD notified (1st page):  Elink Time of first page:  0400 Responding MD:  Dr Craige Cotta called in box

## 2012-08-08 NOTE — ED Notes (Addendum)
7.5 ETT inserted per resident without difficulty; BBS present - positive capnometer color change; no air auscultated over epigastrim when instilled - tube secured at 23 at lip; vent settings as follows: TV 460, AC 16, peep 5, FIO2 100%

## 2012-08-08 NOTE — H&P (Signed)
Name: Heidi [name] MRN: [id number] DOB: February 17, 1956    LOS: 0  Referring Provider:  EDP Reason for Referral:  Acute respiratory failure  PULMONARY / CRITICAL CARE MEDICINE  The patient is encephalopathic and unable to provide history, which was obtained for available medical records.  HPI:  56 yo with COPD admitted 10/10 with acute respiratory distress requiring intubation.  Past Medical History  Diagnosis Date  . COPD (chronic obstructive pulmonary disease)   . NSTEMI (non-ST elevated myocardial infarction)     12/2011 with normal coronaries possibly secondary to Takotsubo (EF 30-35% by echo), occurred following two episodes of acute respiratory distress  . Hypotension     limiting med titration   Past Surgical History  Procedure Date  . Abdominal hysterectomy   . Vagina rectal repair     after childbirth   Prior to Admission medications   Medication Sig Start Date End Date Taking? Authorizing Provider  albuterol (PROVENTIL HFA;VENTOLIN HFA) 108 (90 BASE) MCG/ACT inhaler Inhale 2 puffs into the lungs every 6 (six) hours as needed. Shortness of breath 07/16/12   Nyoka Cowden, MD  albuterol (PROVENTIL) (2.5 MG/3ML) 0.083% nebulizer solution Take 3 mLs (2.5 mg total) by nebulization every 4 (four) hours as needed for wheezing or shortness of breath. 02/16/12 02/15/13  Nyoka Cowden, MD  aspirin EC 81 MG EC tablet Take 1 tablet (81 mg total) by mouth daily. 01/25/12 01/24/13  Dayna N Dunn, PA  budesonide-formoterol (SYMBICORT) 160-4.5 MCG/ACT inhaler Inhale 2 puffs into the lungs 2 (two) times daily.      Historical Provider, MD  citalopram (CELEXA) 20 MG tablet Take 20 mg by mouth daily.    Historical Provider, MD  ipratropium (ATROVENT) 0.02 % nebulizer solution Take 0.5 mg by nebulization every 4 (four) hours as needed. 02/16/12 02/15/13  Nyoka Cowden, MD  losartan (COZAAR) 25 MG tablet Take 1 tablet (25 mg total) by mouth daily. 01/25/12 01/24/13  Dayna N Dunn, PA  OXYGEN-HELIUM IN  Inhale into the lungs. 3 lpm at night and as needed during the day    Historical Provider, MD  sodium chloride (OCEAN) 0.65 % SOLN nasal spray Place 1 spray into the nose as needed for congestion. 02/02/12   Gwenyth Bender, NP   Allergies Allergies  Allergen Reactions  . Codeine Nausea Only   Family History Family History  Problem Relation Age of Onset  . Heart attack Mother     deceased from massive MI at age 26  . Liver disease Father     fatty liver; living, age 93  . Stroke Brother     at age 70, living   . Hypertension Brother     living, age 61   Social History  reports that she quit smoking about 6 months ago. Her smoking use included Cigarettes. She quit after 30 years of use. She has never used smokeless tobacco. She reports that she drinks alcohol. She reports that she does not use illicit drugs.  Review Of Systems:  Unable to provide  Brief patient description:  56 yo with COPD admitted 10/10 with acute respiratory distress requiring intubation.  Events Since Admission: 10/10  Admitted with acute respiratory distress requiring intubation.  Current Status:  Vital Signs: Temp:  [94.1 F (34.5 C)-95 F (35 C)] 94.1 F (34.5 C) (10/10 0211) Pulse Rate:  [72-91] 82  (10/10 0211) Resp:  [16] 16  (10/10 0206) BP: (68-94)/(21-68) 93/57 mmHg (10/10 0211) SpO2:  [98 %-100 %] 100 % (  10/10 0211) FiO2 (%):  [50 %-100 %] 50 % (10/10 0142) Weight:  [53.4 kg (117 lb 11.6 oz)] 53.4 kg (117 lb 11.6 oz) (10/10 0100)  Physical Examination: General:  Mechanically, synchronous Neuro:  Sedated, nonfocal, cough / gag present HEENT:  PERRL Neck:  No JVD Cardiovascular:  RRR, no m/r/g Lungs:  Bilateral diminished air entry, expiratory wheezes Abdomen:  Soft, nontender, nondistended, bowel sounds present Musculoskeletal:  Moves allextremities Skin:  No rash  Active Problems:  Acute respiratory failure  Acute encephalopathy  COPD with acute exacerbation  Lactic  acidosis  ASSESSMENT AND PLAN  PULMONARY  Lab 08/08/12 0101  PHART 7.206*  PCO2ART 46.5*  PO2ART 612.0*  HCO3 18.4*  O2SAT 100.0   Ventilator Settings: Vent Mode:  [-] PRVC FiO2 (%):  [50 %-100 %] 50 % Set Rate:  [16 bmp] 16 bmp Vt Set:  [460 mL] 460 mL PEEP:  [5 cmH20] 5 cmH20 Plateau Pressure:  [22 cmH20] 22 cmH20  CXR:  10/10  Hyperinflation, no overt airspace disease  ETT:  10/10 >>>  A:  AECOPD.  Acute respiratory failure. P:   Goal SpO2 > 92, pH > 7.30 Full mechanical support Trend CXR Trend ABG Daily SBT Combivent Solu-Medrol  CARDIOVASCULAR  Lab 08/08/12 0105 08/08/12 0037  TROPONINI -- <0.30  LATICACIDVEN 7.3* --  PROBNP -- --   ECG:  10/10 >>> Sinus tachycardia, no ST-T changes Lines: L IJ TLC 10/10 >>>  A: Transient hypotension after sedated for intubation.  No arrhythmia / ischemia.  Lactic acidosis, likley secondary to hypoxia. P:  Hold antihypertensives Goal MAP > 60 Trend lactic acid Trend troponin Dopamine gtt, titrate to off  RENAL  Lab 08/08/12 0035  NA 135  K 4.1  CL 100  CO2 15*  BUN 12  CREATININE 0.87  CALCIUM 8.4  MG --  PHOS --   Intake/Output      10/09 0701 - 10/10 0700   I.V. (mL/kg) 2500 (46.8)   Total Intake(mL/kg) 2500 (46.8)   Net +2500        Foley:  10/10 >>>  A:  Normal renal function / electrolytes.  P:   Goal CVP 10 to12 INF NS 100 mL/h NS boluses to goal CVP Trend BMP  GASTROINTESTINAL No results found for this basename: AST:5,ALT:5,ALKPHOS:5,BILITOT:5,PROT:5,ALBUMIN:5 in the last 168 hours  A:  No active issues. P:   NPO as intubated TF if remains intubated > 24 h  HEMATOLOGIC  Lab 08/08/12 0035  HGB 11.8*  HCT 34.9*  PLT 253  INR --  APTT --   A:  Mild anemia. P:  Trend CBC  INFECTIOUS  Lab 08/08/12 0035  WBC 8.6  PROCALCITON --   Cultures: 10/10  Blood >>> 10/10  Respiratory >>>  Antibiotics: Avelox 10/10 x 1 Flagyl 10/10 x 1 Vancomycin 10/10 x 1 Levaquin 10/10  >>>  A:  No clear source of infection. P:   Antibiotics / cultures as above PCT Urine Strep / Legionella Ag  ENDOCRINE No results found for this basename: GLUCAP:5 in the last 168 hours  A:  No active issues. P:   No interventions required.  NEUROLOGIC  Head CT:  10/10 >>> nad  A:  Acute encephalopathy. P:   Goal RASS 0 to -1 Daily WUA Fentanyl / versed gtt  BEST PRACTICE / DISPOSITION Level of Care:  ICU Primary Service:  PCCM Consultants:  None Code Status:  Full Diet:  NPO DVT Px:  Protonix GI Px:  Heparin Skin  Integrity:  Intact Social / Family:  Family updated in ED  The patient is critically ill with multiple organ systems failure and requires high complexity decision making for assessment and support, frequent evaluation and titration of therapies, application of advanced monitoring technologies and extensive interpretation of multiple databases. Critical Care Time devoted to patient care services described in this note is 45 minutes.  Lonia Farber, M.D. Pulmonary and Critical Care Medicine [facility] Pager: [phone]  08/08/2012, 2:20 AM

## 2012-08-08 NOTE — ED Notes (Signed)
NS 500 cc bolus given W.O. - Dr.Kohut made aware of present BP

## 2012-08-08 NOTE — ED Notes (Signed)
intensivist in to assess

## 2012-08-08 NOTE — ED Notes (Signed)
Pt is biting on her ETT, bucking the vent, and coughing. See MAR.

## 2012-08-08 NOTE — ED Notes (Signed)
Patient transported from CT 

## 2012-08-08 NOTE — ED Provider Notes (Addendum)
History    56 year old female brought in by EMS in respiratory distress. 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 Just before arrival though patient was found in the parking lot honking her car horn and was in severe distress. She was found holding her inhaler and tripoding. Seemed to be in usual health when last seen a few minutes prior to this. Patient with poor air movement and poor respiratory effort for EMS on their arrival. They attempted to intubate in the field unsuccessfully. Patient arrived in the resuscitation bay with a nasal trumpet in place. Very poor air movement bilaterally with some wheezing noted on the left side. Gulping respirations. Patient was intubated for airway protection. Pt with hospitalization this past spring for non-STEMI. 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 Follow-up ECHO in July with normal EF and no regional wall abnormalities. Patient has a history of severe uncontrolled COPD. patient continues to smoke. No history of pulmonary embolism per family her review of records.  CSN: [id number]  Arrival date & time 08/08/12  0022   First MD Initiated Contact with Patient 08/08/12 0031      Chief Complaint  Patient presents with  . Shortness of Breath    found in car clutching inhaler - tripoding - subsequently went into resp arrest - EMS attempted intubation without success; bagged Albuterol tx into pt with return of resp - shallow - guppy breathing at approx 16 breaths per min; now being bagged via BVM at 100% o2      (Consider location/radiation/quality/duration/timing/severity/associated sxs/prior treatment) HPI  Past Medical History  Diagnosis Date  . COPD (chronic obstructive pulmonary disease)   . NSTEMI (non-ST elevated myocardial infarction)     12/2011 with normal coronaries possibly secondary to  Takotsubo (EF 30-35% by echo), occurred following two episodes of acute respiratory distress  . Hypotension     limiting med titration    Past Surgical History  Procedure Date  . Abdominal hysterectomy   . Vagina rectal repair     after childbirth    Family History  Problem Relation Age of Onset  . Heart attack Mother     deceased from massive MI at age 51  . Liver disease Father     fatty liver; living, age 71  . Stroke Brother     at age 53, living   . Hypertension Brother     living, age 26    History  Substance Use Topics  . Smoking status: Former Smoker -- 30 years    Types: Cigarettes    Quit date: 01/21/2012  . Smokeless tobacco: Never Used   Comment: smoked 1 ppd X 30 years, has recently cut down to 3 packs per week; has quit smoking entirely X 2 days  . Alcohol Use: Yes     1-2 beers per week    OB History    Grav Para Term Preterm Abortions TAB SAB Ect Mult Living                  Review of Systems  5 caveat applies because patient is in severe respiratory distress.  Allergies  Codeine  Home Medications   Current Outpatient Rx  Name Route Sig Dispense Refill  . ALBUTEROL SULFATE HFA 108 (90 BASE) MCG/ACT IN AERS Inhalation Inhale 2 puffs into the lungs every 6 (six) hours as needed. Shortness  of breath 1 Inhaler 3  . ALBUTEROL SULFATE (2.5 MG/3ML) 0.083% IN NEBU Nebulization Take 3 mLs (2.5 mg total) by nebulization every 4 (four) hours as needed for wheezing or shortness of breath. 360 mL 3  . ASPIRIN 81 MG PO TBEC Oral Take 1 tablet (81 mg total) by mouth daily.    . BUDESONIDE-FORMOTEROL FUMARATE 160-4.5 MCG/ACT IN AERO Inhalation Inhale 2 puffs into the lungs 2 (two) times daily.      Marland Kitchen CITALOPRAM HYDROBROMIDE 20 MG PO TABS Oral Take 20 mg by mouth daily.    . IPRATROPIUM BROMIDE 0.02 % IN SOLN Nebulization Take 0.5 mg by nebulization every 4 (four) hours as needed.    Marland Kitchen LOSARTAN POTASSIUM 25 MG PO TABS Oral Take 1 tablet (25 mg total) by mouth  daily. 30 tablet 6  . OXYGEN-HELIUM IN Inhalation Inhale into the lungs. 3 lpm at night and as needed during the day    . SALINE 0.65 % NA SOLN Nasal Place 1 spray into the nose as needed for congestion. 1 Bottle     BP 86/57  Pulse 74  Temp 95 F (35 C) (Core (Comment))  Resp 16  Ht 5' 6.14" (1.68 m)  Wt 117 lb 11.6 oz (53.4 kg)  BMI 18.92 kg/m2  SpO2 100%  Physical Exam  Nursing note and vitals reviewed. Constitutional: She appears well-developed and well-nourished.       Severe respiratory distress  HENT:  Head: Normocephalic and atraumatic.       Nasal airway in place. No external signs of head trauma. No signs of intraoral trauma or foreign body on laryngoscopy  Eyes: Conjunctivae normal are normal. Pupils are equal, round, and reactive to light. Right eye exhibits no discharge. Left eye exhibits no discharge.  Neck: Neck supple.  Cardiovascular: Normal rate, regular rhythm and normal heart sounds.  Exam reveals no gallop and no friction rub.   No murmur heard. Pulmonary/Chest: She is in respiratory distress. She has wheezes.       Severe respiratory distress. Very poor effort and almost agonal breathing. Little air movement bilaterally. Faint wheezing heard on the left side.  Abdominal: Soft. She exhibits no distension. There is no tenderness.  Musculoskeletal: She exhibits no edema and no tenderness.  Neurological:       GCS 9.  M 5, V2, E 2. Noted to be moving all extremities.  No facial droop noted.  Skin: Skin is dry.       Cool to touch  Psychiatric:       Agitated    ED Course  INTUBATION Date/Time: 08/08/2012 1:24 AM Performed by: Raeford Razor Authorized by: Raeford Razor Consent: The procedure was performed in an emergent situation. Required items: required blood products, implants, devices, and special equipment available Patient identity confirmed: arm band and provided demographic data Indications: airway protection Intubation method:  video-assisted Patient status: paralyzed (RSI) Preoxygenation: BVM Sedatives: see MAR for details Paralytic: succinylcholine Tube size: 7.5 mm Tube type: cuffed Number of attempts: 1 Cricoid pressure: no Cords visualized: yes Post-procedure assessment: chest rise and CO2 detector Breath sounds: equal Cuff inflated: yes ETT to teeth: 23 cm Tube secured with: ETT holder Chest x-ray interpreted by me and radiologist. Chest x-ray findings: endotracheal tube in appropriate position Patient tolerance: Patient tolerated the procedure well with no immediate complications. Comments: Patient was intubated by Dr. Colbert Coyer, EM resident. I was present and directly supervising throughout the procedure  CENTRAL LINE Date/Time: 08/08/2012 3:11 AM Performed by: Juleen China,  Cole Eastridge Authorized by: Raeford Razor Consent: The procedure was performed in an emergent situation. Required items: required blood products, implants, devices, and special equipment available Patient identity confirmed: arm band and provided demographic data Time out: Immediately prior to procedure a "time out" was called to verify the correct patient, procedure, equipment, support staff and site/side marked as required. Indications: vascular access and central pressure monitoring Anesthesia: local infiltration Local anesthetic: lidocaine 1% without epinephrine Anesthetic total: 2 ml Preparation: skin prepped with 2% chlorhexidine Skin prep agent dried: skin prep agent completely dried prior to procedure Sterile barriers: all five maximum sterile barriers used - cap, mask, sterile gown, sterile gloves, and large sterile sheet Hand hygiene: hand hygiene performed prior to central venous catheter insertion Location details: left internal jugular Patient position: flat Catheter type: triple lumen Pre-procedure: landmarks identified Ultrasound guidance: yes Number of attempts: 1 Successful placement: yes Post-procedure: line  sutured and dressing applied Assessment: blood return through all parts, free fluid flow, placement verified by x-ray and no pneumothorax on x-ray Patient tolerance: Patient tolerated the procedure well with no immediate complications.   CRITICAL CARE Performed by: Raeford Razor   Total critical care time: 60 minutes  Critical care time was exclusive of separately billable procedures and treating other patients.  Critical care was necessary to treat or prevent imminent or life-threatening deterioration.  Critical care was time spent personally by me on the following activities: development of treatment plan with patient and/or surrogate as well as nursing, discussions with consultants, evaluation of patient's response to treatment, examination of patient, obtaining history from patient or surrogate, ordering and performing treatments and interventions, ordering and review of laboratory studies, ordering and review of radiographic studies, pulse oximetry and re-evaluation of patient's condition.   (including critical care time)  Labs Reviewed  CBC - Abnormal; Notable for the following:    RBC 3.45 (*)     Hemoglobin 11.8 (*)     HCT 34.9 (*)     MCV 101.2 (*)     MCH 34.2 (*)     All other components within normal limits  BASIC METABOLIC PANEL - Abnormal; Notable for the following:    CO2 15 (*)     Glucose, Bld 212 (*)     GFR calc non Af Amer 73 (*)     GFR calc Af Amer 85 (*)     All other components within normal limits  POCT I-STAT 3, BLOOD GAS (G3+) - Abnormal; Notable for the following:    pH, Arterial 7.206 (*)     pCO2 arterial 46.5 (*)     pO2, Arterial 612.0 (*)     Bicarbonate 18.4 (*)     Acid-base deficit 9.0 (*)     All other components within normal limits  TROPONIN I  BLOOD GAS, ARTERIAL  URINALYSIS, MICROSCOPIC ONLY  URINE CULTURE  CULTURE, BLOOD (ROUTINE X 2)  CULTURE, BLOOD (ROUTINE X 2)  LACTIC ACID, PLASMA  ETHANOL  PROTIME-INR   Dg Chest  Portable 1 View  08/08/2012  *RADIOLOGY REPORT*  Clinical Data: 56 year old female intubated.  PORTABLE CHEST - 1 VIEW  Comparison: 01/31/2012 and earlier.  Findings: Portable supine AP view 0042 hours.  Endotracheal tube tip at the level of clavicles.  Enteric tube courses to the abdomen, side hole the level of the gastric fundus. Larger lung volumes.  Chronic pulmonary hyperinflation.  No pulmonary edema.  No pleural effusion or pneumothorax identified. Stable apical scarring.  No confluent pulmonary opacity.  Cardiac  size and mediastinal contours are within normal limits.  IMPRESSION: 1.  Endotracheal tube and enteric tube appear per early place. 2.  Chronic pulmonary hyperinflation. No acute cardiopulmonary abnormality.   Original Report Authenticated By: Ulla Potash III, M.D.    EKG:  Rhythm: Sinus tachycardia Rate: 109 Axis: Normal Intervals: Normal ST segments: Normal   1. Respiratory arrest   2. Metabolic acidosis       MDM  56 year old female with respiratory arrest. Intubated for airway protection. Acidotic on her ABG although PCO2 is only in the mid 40s. Hypothermic. We'll obtain cultures and urinalysis but will defer from antibiotics at this time. Consider cardiac etiology. EKG with no ischemic changes. Troponin pending. Albuterol/atrovent/steroids for presumed exacerbation or reactive airway dz. Consider central event given onset of symptoms sounds very acute. CT head. Discussed with CCM. Admit.         Raeford Razor, MD 08/08/12 1610  Raeford Razor, MD 08/08/12 [phone]

## 2012-08-08 NOTE — Progress Notes (Signed)
Chaplain Note:  Chaplain responded immediately to referral from [facility] charge nurse.  Pt was in trauma bay being treated by [facility] staff.  Family was in conference room.  Chaplain provided spiritual comfort and support for pt, pt's family, and [facility] staff.  Chaplain escorted family to and from pt's room and to the NICU waiting room.  Family expressed appreciation for chaplain support.  Chaplain will follow up as needed.  08/08/12 0025  Clinical Encounter Type  Visited With Patient and family together  Visit Type Spiritual support  Referral From Nurse  Spiritual Encounters  Spiritual Needs Emotional  Stress Factors  Patient Stress Factors Major life changes;Health changes  Family Stress Factors Major life changes   Verdie Shire, [place] [phone]

## 2012-08-08 NOTE — Progress Notes (Signed)
 This encounter was created in error - please disregard.

## 2012-08-08 NOTE — ED Notes (Signed)
14 french indwelling foley catheter inserted per EDT - approx 50 cc cloudy yellow urine obtained in collection bag - specimen collected for u/a and culture - sent to lab

## 2012-08-08 NOTE — ED Notes (Signed)
16 french NGT inserted orally without difficulty; placement verified per protocol; air auscultated over epigastrim when instilled; aspirated yellowish stomach contents; tube secured

## 2012-08-08 NOTE — ED Notes (Signed)
Patient transported to CT 

## 2012-08-09 ENCOUNTER — Inpatient Hospital Stay (HOSPITAL_COMMUNITY): Payer: Medicaid Other

## 2012-08-09 DIAGNOSIS — J441 Chronic obstructive pulmonary disease with (acute) exacerbation: Secondary | ICD-10-CM

## 2012-08-09 DIAGNOSIS — J449 Chronic obstructive pulmonary disease, unspecified: Secondary | ICD-10-CM

## 2012-08-09 DIAGNOSIS — R092 Respiratory arrest: Secondary | ICD-10-CM

## 2012-08-09 DIAGNOSIS — J9600 Acute respiratory failure, unspecified whether with hypoxia or hypercapnia: Secondary | ICD-10-CM

## 2012-08-09 LAB — GLUCOSE, CAPILLARY
Glucose-Capillary: 147 mg/dL — ABNORMAL HIGH (ref 70–99)
Glucose-Capillary: 156 mg/dL — ABNORMAL HIGH (ref 70–99)
Glucose-Capillary: 122 mg/dL — ABNORMAL HIGH (ref 70–99)
Glucose-Capillary: 183 mg/dL — ABNORMAL HIGH (ref 70–99)

## 2012-08-09 LAB — COMPREHENSIVE METABOLIC PANEL
ALT: 34 U/L (ref 0–35)
AST: 34 U/L (ref 0–37)
Alkaline Phosphatase: 92 U/L (ref 39–117)
CO2: 27 mEq/L (ref 19–32)
Calcium: 9.3 mg/dL (ref 8.4–10.5)
Potassium: 4 mEq/L (ref 3.5–5.1)
Sodium: 141 mEq/L (ref 135–145)
Total Protein: 6.4 g/dL (ref 6.0–8.3)

## 2012-08-09 LAB — CBC
HCT: 34.9 % — ABNORMAL LOW (ref 36.0–46.0)
Hemoglobin: 11.8 g/dL — ABNORMAL LOW (ref 12.0–15.0)
MCH: 33.3 pg (ref 26.0–34.0)
MCHC: 33.8 g/dL (ref 30.0–36.0)
MCV: 98.6 fL (ref 78.0–100.0)

## 2012-08-09 LAB — URINE CULTURE
Colony Count: NO GROWTH
Culture: NO GROWTH

## 2012-08-09 LAB — PROCALCITONIN: Procalcitonin: 0.14 ng/mL

## 2012-08-09 MED ORDER — SALINE SPRAY 0.65 % NA SOLN
1.0000 | NASAL | Status: DC | PRN
  Filled 2012-08-09: qty 44

## 2012-08-09 MED ORDER — ASPIRIN EC 81 MG PO TBEC
81.0000 mg | DELAYED_RELEASE_TABLET | Freq: Every day | ORAL | Status: DC
Start: 2012-08-09 — End: 2012-08-13
  Administered 2012-08-09 – 2012-08-13 (×5): 81 mg via ORAL
  Filled 2012-08-09 (×6): qty 1

## 2012-08-09 NOTE — Progress Notes (Signed)
Name: Heidi [name] MRN: [id number] DOB: [birth year]    LOS: 1  Referring Provider:  EDP Reason for Referral:  Acute respiratory failure  PULMONARY / CRITICAL CARE MEDICINE  HPI:  56 yo with COPD admitted 10/10 with acute respiratory distress requiring intubation.  Brief patient description:  56 yo with COPD admitted 10/10 with acute respiratory distress requiring intubation.  Events Since Admission: 10/10  Admitted with acute respiratory distress requiring intubation.  Current Status: Stable  Subjective: Awake, interacting, working with PT  Vital Signs: Temp:  [97.9 F (36.6 C)-99 F (37.2 C)] 97.9 F (36.6 C) (10/11 0846) Pulse Rate:  [75-101] 99  (10/11 1203) Resp:  [15-27] 20  (10/11 1100) BP: (81-134)/(39-113) 129/65 mmHg (10/11 1203) SpO2:  [99 %-100 %] 100 % (10/11 1203) Weight:  [54.7 kg (120 lb 9.5 oz)] 54.7 kg (120 lb 9.5 oz) (10/11 0500)  Intake/Output Summary (Last 24 hours) at 08/09/12 1224 Last data filed at 08/09/12 1130  Gross per 24 hour  Intake   2160 ml  Output   1600 ml  Net    560 ml   Physical Examination: General:  Ill appearing, older than stated age Neuro:  awake, nonfocal HEENT:  PERRL Neck:  No JVD Cardiovascular:  RRR, no m/r/g Lungs:  Bilateral diminished air entry, distant, no wheeze Abdomen:  Soft, nontender, nondistended, bowel sounds present Musculoskeletal:  Moves allextremities Skin:  No rash  Active Problems:  Acute respiratory failure  Acute encephalopathy  COPD with acute exacerbation  Lactic acidosis  ASSESSMENT AND PLAN  PULMONARY  Lab 08/08/12 1117 08/08/12 0101  PHART 7.332* 7.206*  PCO2ART 47.3* 46.5*  PO2ART 168.0* 612.0*  HCO3 25.1* 18.4*  O2SAT 99.0 100.0   Ventilator Settings:    CXR:  10/10  Hyperinflation, no overt airspace disease  ETT:  10/10 >>> 10/10  A:  AECOPD.  Acute respiratory failure. P:   Goal SpO2 > 92, pH > 7.30 DuoNebs as ordered, change back to Symbicort before d/c home.  Consider addition Spiriva before d/c home.  Solu-Medrol >> convert to pred on 10/12  CARDIOVASCULAR  Lab 08/08/12 1425 08/08/12 0830 08/08/12 0821 08/08/12 0306 08/08/12 0223 08/08/12 0105 08/08/12 0037  TROPONINI 1.46* 1.10* -- -- 0.66* -- <0.30  LATICACIDVEN 0.9 -- 1.0 3.3* -- 7.3* --  PROBNP -- -- -- -- -- -- --   ECG:  10/10 >>> Sinus tachycardia, no ST-T changes Lines: L IJ TLC 10/10 >>> 10/10  A: Transient hypotension after sedated for intubation.  No arrhythmia / ischemia.  Lactic acidosis, likley secondary to hypoxia, quickly improved. P:  Hold antihypertensives Goal MAP > 60  RENAL  Lab 08/09/12 0335 08/08/12 0821 08/08/12 0035  NA 141 138 135  K 4.0 3.9 --  CL 106 103 100  CO2 27 25 15*  BUN 7 7 12   CREATININE 0.58 0.54 0.87  CALCIUM 9.3 7.8* 8.4  MG -- -- --  PHOS -- -- --   Intake/Output      10/10 0701 - 10/11 0700 10/11 0701 - 10/12 0700   P.O. 490 480   I.V. (mL/kg) 1457.8 (26.7) 250 (4.6)   IV Piggyback 10    Total Intake(mL/kg) 1957.8 (35.8) 730 (13.3)   Urine (mL/kg/hr) 1810 (1.4) 500 (1.7)   Total Output 1810 500   Net +147.8 +230        Urine Occurrence 2 x 1 x    Foley:  10/10 >>> 10/10  A:  Normal renal function / electrolytes.  P:   Trend BMP  GASTROINTESTINAL  Lab 08/09/12 0335 08/08/12 0821  AST 34 72*  ALT 34 48*  ALKPHOS 92 99  BILITOT 0.2* 0.4  PROT 6.4 6.3  ALBUMIN 3.5 3.6    A:  No active issues. P:   PO diet  HEMATOLOGIC  Lab 08/09/12 0335 08/08/12 0821 08/08/12 0206 08/08/12 0035  HGB 11.8* 12.0 -- 11.8*  HCT 34.9* 34.4* -- 34.9*  PLT 218 219 -- 253  INR -- -- 1.01 --  APTT -- -- -- --   A:  Mild anemia. P:  Trend CBC  INFECTIOUS  Lab 08/09/12 0335 08/08/12 0821 08/08/12 0300 08/08/12 0035  WBC 9.7 9.3 -- 8.6  PROCALCITON 0.14 -- <0.10 --   Cultures: 10/10  Blood >>> 10/10  Respiratory >>> normal flora 10/10  Urine >> negative 10/10 legionella Ag >> negative 10/10 pneumococcal Ag >>  negative  Antibiotics: Avelox 10/10 x 1 Flagyl 10/10 x 1 Vancomycin 10/10 x 1 Levaquin 10/10 >>> 10/11  A:  No clear source of infection. P:   - d/c levaquin 10/11 and follow clinically  ENDOCRINE  Lab 08/09/12 0831 08/08/12 1948 08/08/12 1538 08/08/12 1216 08/08/12 0748  GLUCAP 147* 120* 130* 139* 198*    A:  No active issues. P:   No interventions required.  NEUROLOGIC  Head CT:  10/10 >>> nad  A:  Acute encephalopathy. P:   Goal RASS 0 to -1 Daily WUA Fentanyl / versed gtt  BEST PRACTICE / DISPOSITION Level of Care:  ICU >> to floor bed Primary Service:  PCCM Consultants:  None Code Status:  Full Diet:  regular DVT Px:  Protonix GI Px:  Heparin Skin Integrity:  Intact Social / Family:    Leslye Peer., M.D. Pulmonary and Critical Care Medicine [facility] Pager: [phone]  08/09/2012, 12:24 PM

## 2012-08-09 NOTE — Progress Notes (Addendum)
Report given to Okey Regal, RN on [address].  Patient transferred in wheelchair by nurse tech with patient belongings. Patient's vital signs remain stable with no signs of distress. Milly Jakob, RN.

## 2012-08-09 NOTE — Evaluation (Signed)
Physical Therapy Evaluation Patient Details Name: Heidi [name] MRN: [id number] DOB: [birth year] Today's Date: 08/09/2012 Time: 4098-1191 PT Time Calculation (min): 28 min  PT Assessment / Plan / Recommendation Clinical Impression  Pt adm with acute respiratory failure and intubated briefly.  Pt moving fairly well and should be able to dc to home.  Pt planning to move into a condo.  She has been living with brother but wants to leave that situation. Recommend HHPT.    PT Assessment  Patient needs continued PT services    Follow Up Recommendations  Home health PT    Does the patient have the potential to tolerate intense rehabilitation      Barriers to Discharge        Equipment Recommendations  None recommended by PT    Recommendations for Other Services     Frequency Min 3X/week    Precautions / Restrictions Precautions Precautions: Fall   Pertinent Vitals/Pain SaO2 98-100% walking on 3L of O2      Mobility  Transfers Transfers: Sit to Stand;Stand to Sit Sit to Stand: 5: Supervision;From chair/3-in-1;With upper extremity assist;With armrests Stand to Sit: 5: Supervision;With upper extremity assist;With armrests;To chair/3-in-1 Ambulation/Gait Ambulation/Gait Assistance: 4: Min guard Ambulation Distance (Feet): 130 Feet Assistive device: Other (Comment) (pushing w/c or IV pole) Ambulation/Gait Assistance Details: verbal cues to stand more erect Gait Pattern: Trunk flexed;Narrow base of support    Shoulder Instructions     Exercises     PT Diagnosis: Difficulty walking;Generalized weakness  PT Problem List: Decreased strength;Decreased activity tolerance;Decreased balance;Decreased mobility PT Treatment Interventions: DME instruction;Stair training;Functional mobility training;Patient/family education;Therapeutic activities;Balance training;Therapeutic exercise   PT Goals Acute Rehab PT Goals PT Goal Formulation: With patient Time For Goal Achievement:  08/16/12 Potential to Achieve Goals: Good Pt will go Supine/Side to Sit: with modified independence PT Goal: Supine/Side to Sit - Progress: Goal set today Pt will go Sit to Supine/Side: with modified independence PT Goal: Sit to Supine/Side - Progress: Goal set today Pt will go Sit to Stand: with modified independence PT Goal: Sit to Stand - Progress: Goal set today Pt will go Stand to Sit: with modified independence PT Goal: Stand to Sit - Progress: Goal set today Pt will Ambulate: >150 feet;with modified independence;with least restrictive assistive device PT Goal: Ambulate - Progress: Goal set today  Visit Information  Last PT Received On: 08/09/12 Assistance Needed: +1    Subjective Data  Subjective: Pt states she usually wears O2 just at night. Patient Stated Goal: Return home to her new place to live.   Prior Functioning  Home Living Lives With: Other (Comment) (Is moving into a condo.  was living with her brother) Type of Home: Apartment Home Access: Level entry Home Layout: One level Home Adaptive Equipment: None Prior Function Level of Independence: Independent Able to Take Stairs?: Yes Driving: Yes Vocation: Part time employment Comments: works at [employer] 4 hrs/day M-F Communication Communication: No difficulties    Cognition  Overall Cognitive Status: Appears within functional limits for tasks assessed/performed Arousal/Alertness: Awake/alert Orientation Level: Appears intact for tasks assessed Behavior During Session: [facility] for tasks performed    Extremity/Trunk Assessment Right Lower Extremity Assessment RLE ROM/Strength/Tone: Deficits RLE ROM/Strength/Tone Deficits: grossly 4/5 Left Lower Extremity Assessment LLE ROM/Strength/Tone: Deficits LLE ROM/Strength/Tone Deficits: grossly 4/5   Balance Static Standing Balance Static Standing - Balance Support: During functional activity;No upper extremity supported Static Standing - Level of Assistance: 5: Stand  by assistance  End of Session PT - End  of Session Activity Tolerance: Patient tolerated treatment well Patient left: in chair;with call bell/phone within reach Nurse Communication: Mobility status  GP     [facility] 08/09/2012, 12:38 PM  [facility] PT [phone]

## 2012-08-10 DIAGNOSIS — J069 Acute upper respiratory infection, unspecified: Secondary | ICD-10-CM

## 2012-08-10 DIAGNOSIS — J449 Chronic obstructive pulmonary disease, unspecified: Secondary | ICD-10-CM

## 2012-08-10 DIAGNOSIS — Z87891 Personal history of nicotine dependence: Secondary | ICD-10-CM

## 2012-08-10 DIAGNOSIS — J9610 Chronic respiratory failure, unspecified whether with hypoxia or hypercapnia: Secondary | ICD-10-CM

## 2012-08-10 LAB — GLUCOSE, CAPILLARY
Glucose-Capillary: 176 mg/dL — ABNORMAL HIGH (ref 70–99)
Glucose-Capillary: 151 mg/dL — ABNORMAL HIGH (ref 70–99)
Glucose-Capillary: 168 mg/dL — ABNORMAL HIGH (ref 70–99)
Glucose-Capillary: 139 mg/dL — ABNORMAL HIGH (ref 70–99)

## 2012-08-10 LAB — CULTURE, RESPIRATORY W GRAM STAIN

## 2012-08-10 MED ORDER — METHYLPREDNISOLONE SODIUM SUCC 40 MG IJ SOLR
40.0000 mg | Freq: Two times a day (BID) | INTRAMUSCULAR | Status: DC
  Administered 2012-08-10 – 2012-08-11 (×2): 40 mg via INTRAVENOUS
  Filled 2012-08-10 (×6): qty 1

## 2012-08-10 MED ORDER — PANTOPRAZOLE SODIUM 40 MG PO TBEC
40.0000 mg | DELAYED_RELEASE_TABLET | Freq: Every day | ORAL | Status: DC
  Administered 2012-08-10 – 2012-08-12 (×3): 40 mg via ORAL
  Filled 2012-08-10 (×3): qty 1

## 2012-08-10 NOTE — Progress Notes (Signed)
Name: Heidi [name] MRN: [id number] DOB: 1956/04/04    LOS: 2  Referring Provider:  EDP Reason for Referral:  Acute respiratory failure  PULMONARY / CRITICAL CARE MEDICINE  HPI:  56 yo with COPD admitted 10/10 with acute respiratory distress requiring intubation.  Brief patient description:  56 yo with COPD admitted 10/10 with acute respiratory distress requiring intubation.  Events Since Admission: 10/10  Admitted with acute respiratory distress requiring brief intubation. 10/11  Tx to 6N- room8   Current Status: 10/12> Stable- breathing easily w/o distress  Subjective: Awake, interacting, working with PT  Vital Signs: Temp:  [97.7 F (36.5 C)-98.2 F (36.8 C)] 98.1 F (36.7 C) (10/12 0527) Pulse Rate:  [86-99] 86  (10/12 0527) Resp:  [15-27] 18  (10/12 0527) BP: (103-129)/(52-86) 122/72 mmHg (10/12 0527) SpO2:  [96 %-100 %] 98 % (10/12 0716)  Intake/Output Summary (Last 24 hours) at 08/10/12 0838 Last data filed at 08/09/12 1756  Gross per 24 hour  Intake    860 ml  Output    800 ml  Net     60 ml   Physical Examination: General:  Ill appearing, older than stated age Neuro:  awake, nonfocal HEENT:  PERRL Neck:  No JVD Cardiovascular:  RRR, no m/r/g Lungs:  Bilateral diminished air entry, distant, no wheeze Abdomen:  Soft, nontender, nondistended, bowel sounds present Musculoskeletal:  Moves allextremities Skin:  No rash  Active Problems:  Acute respiratory failure  Acute encephalopathy  COPD with acute exacerbation  Lactic acidosis  ASSESSMENT AND PLAN  PULMONARY  Lab 08/08/12 1117 08/08/12 0101  PHART 7.332* 7.206*  PCO2ART 47.3* 46.5*  PO2ART 168.0* 612.0*  HCO3 25.1* 18.4*  O2SAT 99.0 100.0   Ventilator Settings:   CXR:  10/10  Hyperinflation, no overt airspace disease  ETT:  10/10 >>> 10/10  A:  AECOPD.  Acute respiratory failure. P:   Goal SpO2 > 92, pH > 7.30 DuoNebs as ordered, change back to Symbicort before d/c home. Consider  addition Spiriva before d/c home.  Solu-Medrol >> will wean slowly...   CARDIOVASCULAR  Lab 08/08/12 1425 08/08/12 0830 08/08/12 0821 08/08/12 0306 08/08/12 0223 08/08/12 0105 08/08/12 0037  TROPONINI 1.46* 1.10* -- -- 0.66* -- <0.30  LATICACIDVEN 0.9 -- 1.0 3.3* -- 7.3* --  PROBNP -- -- -- -- -- -- --   ECG:  10/10 >>> Sinus tachycardia, no ST-T changes Lines: L IJ TLC 10/10 >>> 10/10  A: Transient hypotension after sedated for intubation.  No arrhythmia / ischemia.  Lactic acidosis, likley secondary to hypoxia, quickly improved. P:  Hold antihypertensives Goal MAP > 60   RENAL  Lab 08/09/12 0335 08/08/12 0821 08/08/12 0035  NA 141 138 135  K 4.0 3.9 --  CL 106 103 100  CO2 27 25 15*  BUN 7 7 12   CREATININE 0.58 0.54 0.87  CALCIUM 9.3 7.8* 8.4  MG -- -- --  PHOS -- -- --   Intake/Output      10/11 0701 - 10/12 0700 10/12 0701 - 10/13 0700   P.O. 840    I.V. (mL/kg) 360 (6.6)    IV Piggyback     Total Intake(mL/kg) 1200 (21.9)    Urine (mL/kg/hr) 1300 (1)    Total Output 1300    Net -100         Urine Occurrence 1 x     Foley:  10/10 >>> 10/10  A:  Normal renal function / electrolytes.  P:  Trend BMP  GASTROINTESTINAL  Lab 08/09/12 0335 08/08/12 0821  AST 34 72*  ALT 34 48*  ALKPHOS 92 99  BILITOT 0.2* 0.4  PROT 6.4 6.3  ALBUMIN 3.5 3.6   A:  No active issues. P:   PO diet   HEMATOLOGIC  Lab 08/09/12 0335 08/08/12 0821 08/08/12 0206 08/08/12 0035  HGB 11.8* 12.0 -- 11.8*  HCT 34.9* 34.4* -- 34.9*  PLT 218 219 -- 253  INR -- -- 1.01 --  APTT -- -- -- --   A:  Mild anemia. P:  Trend CBC   INFECTIOUS  Lab 08/09/12 0335 08/08/12 0821 08/08/12 0300 08/08/12 0035  WBC 9.7 9.3 -- 8.6  PROCALCITON 0.14 -- <0.10 --   Cultures: 10/10  Blood >>> 10/10  Respiratory >>> normal flora 10/10  Urine >> negative 10/10 legionella Ag >> negative 10/10 pneumococcal Ag >> negative  Antibiotics: Avelox 10/10 x 1 Flagyl 10/10 x 1 Vancomycin  10/10 x 1 Levaquin 10/10 >>> 10/11  A:  No clear source of infection. P:   - d/c levaquin 10/11 and follow clinically   ENDOCRINE  Lab 08/10/12 0802 08/09/12 2140 08/09/12 1726 08/09/12 1227 08/09/12 0831  GLUCAP 176* 183* 122* 156* 147*   A:  No active issues. P:   No interventions required.   NEUROLOGIC  Head CT:  10/10 >>> nad  A:  Acute encephalopathy. P:   Goal RASS 0 to -1 Daily WUA Fentanyl / versed gtt   BEST PRACTICE / DISPOSITION Level of Care:  ICU >> to floor bed Primary Service:  PCCM Consultants:  None Code Status:  Full Diet:  regular DVT Px:  Protonix GI Px:  Heparin Skin Integrity:  Intact Social / Family:    Michele Mcalpine, M.D. 08/10/2012, 8:38 AM

## 2012-08-10 NOTE — Progress Notes (Signed)
Physical Therapy Treatment Patient Details Name: Heidi [name] MRN: [id number] DOB: February 18, 1956 Today's Date: 08/10/2012 Time: 1914-7829 PT Time Calculation (min): 18 min  PT Assessment / Plan / Recommendation Comments on Treatment Session  Pt still limited secondary to endurance. Focused session on energy conservation to increase endurance upon d/c. Continue per plan for independent mobility prior to d/c home    Follow Up Recommendations  Home health PT     Does the patient have the potential to tolerate intense rehabilitation     Barriers to Discharge        Equipment Recommendations  None recommended by PT    Recommendations for Other Services    Frequency Min 3X/week   Plan Discharge plan remains appropriate;Frequency remains appropriate    Precautions / Restrictions Restrictions Weight Bearing Restrictions: No   Pertinent Vitals/Pain Pt with no pain throughout session.     Mobility  Bed Mobility Bed Mobility: Supine to Sit;Sitting - Scoot to Edge of Bed;Sit to Supine Supine to Sit: 6: Modified independent (Device/Increase time) Sitting - Scoot to Edge of Bed: 6: Modified independent (Device/Increase time) Sit to Supine: 6: Modified independent (Device/Increase time) Transfers Transfers: Sit to Stand;Stand to Sit Sit to Stand: 5: Supervision;With upper extremity assist;From bed Stand to Sit: 5: Supervision;With upper extremity assist;To bed Details for Transfer Assistance: Cues for hand placement and safety for upright posture Ambulation/Gait Ambulation/Gait Assistance: 5: Supervision Ambulation Distance (Feet): 200 Feet Assistive device: Other (Comment) (IV pole) Ambulation/Gait Assistance Details: Cues for upright posture. Energy conservation techniques given throughout ambulation as well as pursed lip breathing to decrease DOE Gait Pattern: Trunk flexed;Narrow base of support Gait velocity: decreased gait speed General Gait Details: 2 rest breaks needed      Exercises     PT Diagnosis:    PT Problem List:   PT Treatment Interventions:     PT Goals Acute Rehab PT Goals PT Goal: Supine/Side to Sit - Progress: Met PT Goal: Sit to Supine/Side - Progress: Met PT Goal: Sit to Stand - Progress: Progressing toward goal PT Goal: Stand to Sit - Progress: Progressing toward goal PT Goal: Ambulate - Progress: Progressing toward goal  Visit Information  Last PT Received On: 08/10/12 Assistance Needed: +1    Subjective Data      Cognition  Overall Cognitive Status: Appears within functional limits for tasks assessed/performed Arousal/Alertness: Awake/alert Orientation Level: Appears intact for tasks assessed Behavior During Session: [facility] for tasks performed    Balance     End of Session PT - End of Session Equipment Utilized During Treatment: Gait belt;Oxygen Activity Tolerance: Patient tolerated treatment well Patient left: in bed;with call bell/phone within reach Nurse Communication: Mobility status   GP     Milana Kidney 08/10/2012, 5:12 PM

## 2012-08-11 LAB — GLUCOSE, CAPILLARY
Glucose-Capillary: 126 mg/dL — ABNORMAL HIGH (ref 70–99)
Glucose-Capillary: 129 mg/dL — ABNORMAL HIGH (ref 70–99)
Glucose-Capillary: 111 mg/dL — ABNORMAL HIGH (ref 70–99)
Glucose-Capillary: 182 mg/dL — ABNORMAL HIGH (ref 70–99)

## 2012-08-11 MED ORDER — PREDNISONE 20 MG PO TABS
20.0000 mg | ORAL_TABLET | Freq: Two times a day (BID) | ORAL | Status: DC
  Administered 2012-08-11 – 2012-08-13 (×4): 20 mg via ORAL
  Filled 2012-08-11 (×6): qty 1

## 2012-08-11 MED ORDER — DIPHENHYDRAMINE HCL 25 MG PO CAPS
25.0000 mg | ORAL_CAPSULE | Freq: Every evening | ORAL | Status: DC | PRN
  Administered 2012-08-11: 25 mg via ORAL
  Filled 2012-08-11: qty 1

## 2012-08-11 NOTE — Progress Notes (Signed)
Name: Heidi [name] MRN: [id number] DOB: 1955/12/25    LOS: 3  Referring Provider:  EDP Reason for Referral:  Acute respiratory failure  PULMONARY / CRITICAL CARE MEDICINE  HPI:  56 yo with COPD admitted 10/10 with acute respiratory distress requiring intubation.  Brief patient description:  56 yo with COPD admitted 10/10 with acute respiratory distress requiring intubation.  Events Since Admission: 10/10  Admitted with acute respiratory distress requiring brief intubation. 10/11  Tx to 6N- room8   Current Status: 10/12> Stable- breathing easily w/o distress  Subjective: Awake, interacting, working with PT  Vital Signs: Temp:  [97.2 F (36.2 C)-98.1 F (36.7 C)] 98 F (36.7 C) (10/13 0522) Pulse Rate:  [80-87] 80  (10/13 0522) Resp:  [18-20] 18  (10/13 0522) BP: (103-113)/(62-65) 103/65 mmHg (10/13 0522) SpO2:  [96 %-100 %] 100 % (10/13 0900)  Intake/Output Summary (Last 24 hours) at 08/11/12 0916 Last data filed at 08/11/12 0600  Gross per 24 hour  Intake   1320 ml  Output   2250 ml  Net   -930 ml   Physical Examination: General:  Ill appearing, older than stated age Neuro:  awake, nonfocal HEENT:  PERRL Neck:  No JVD Cardiovascular:  RRR, no m/r/g Lungs:  Bilateral diminished air entry, distant, no wheeze Abdomen:  Soft, nontender, nondistended, bowel sounds present Musculoskeletal:  Moves allextremities Skin:  No rash  Active Problems:  Acute respiratory failure  Acute encephalopathy  COPD with acute exacerbation  Lactic acidosis  ASSESSMENT AND PLAN  PULMONARY  Lab 08/08/12 1117 08/08/12 0101  PHART 7.332* 7.206*  PCO2ART 47.3* 46.5*  PO2ART 168.0* 612.0*  HCO3 25.1* 18.4*  O2SAT 99.0 100.0   Ventilator Settings:   CXR:  10/10  Hyperinflation, no overt airspace disease  ETT:  10/10 >>> 10/10  A:  AECOPD.  Acute respiratory failure. P:   Goal SpO2 > 92, pH > 7.30 DuoNebs as ordered, change back to Symbicort before d/c home. Consider  addition Spiriva before d/c home.  Solu-Medrol >> will wean slowly==> change to PO Pred 10/13...   CARDIOVASCULAR  Lab 08/08/12 1425 08/08/12 0830 08/08/12 0821 08/08/12 0306 08/08/12 0223 08/08/12 0105 08/08/12 0037  TROPONINI 1.46* 1.10* -- -- 0.66* -- <0.30  LATICACIDVEN 0.9 -- 1.0 3.3* -- 7.3* --  PROBNP -- -- -- -- -- -- --   ECG:  10/10 >>> Sinus tachycardia, no ST-T changes Lines: L IJ TLC 10/10 >>> 10/10  A: Transient hypotension after sedated for intubation.  No arrhythmia / ischemia.  Lactic acidosis, likley secondary to hypoxia, quickly improved. P:  Hold antihypertensives Goal MAP > 60   RENAL  Lab 08/09/12 0335 08/08/12 0821 08/08/12 0035  NA 141 138 135  K 4.0 3.9 --  CL 106 103 100  CO2 27 25 15*  BUN 7 7 12   CREATININE 0.58 0.54 0.87  CALCIUM 9.3 7.8* 8.4  MG -- -- --  PHOS -- -- --   Intake/Output      10/12 0701 - 10/13 0700 10/13 0701 - 10/14 0700   P.O. 1080    I.V. (mL/kg) 240 (4.4)    Total Intake(mL/kg) 1320 (24.1)    Urine (mL/kg/hr) 2250 (1.7)    Total Output 2250    Net -930         Urine Occurrence 1 x     Foley:  10/10 >>> 10/10  A:  Normal renal function / electrolytes.  P:  Trend BMP   GASTROINTESTINAL  Lab 08/09/12  0335 08/08/12 0821  AST 34 72*  ALT 34 48*  ALKPHOS 92 99  BILITOT 0.2* 0.4  PROT 6.4 6.3  ALBUMIN 3.5 3.6   A:  No active issues. P:   PO diet   HEMATOLOGIC  Lab 08/09/12 0335 08/08/12 0821 08/08/12 0206 08/08/12 0035  HGB 11.8* 12.0 -- 11.8*  HCT 34.9* 34.4* -- 34.9*  PLT 218 219 -- 253  INR -- -- 1.01 --  APTT -- -- -- --   A:  Mild anemia. P:  Trend CBC   INFECTIOUS  Lab 08/09/12 0335 08/08/12 0821 08/08/12 0300 08/08/12 0035  WBC 9.7 9.3 -- 8.6  PROCALCITON 0.14 -- <0.10 --   Cultures: 10/10  Blood >>> 10/10  Respiratory >>> normal flora 10/10  Urine >> negative 10/10 legionella Ag >> negative 10/10 pneumococcal Ag >> negative  Antibiotics: Avelox 10/10 x 1 Flagyl 10/10 x  1 Vancomycin 10/10 x 1 Levaquin 10/10 >>> 10/11  A:  No clear source of infection. P:   - d/c levaquin 10/11 and follow clinically   ENDOCRINE  Lab 08/11/12 0805 08/10/12 2159 08/10/12 1726 08/10/12 1209 08/10/12 0802  GLUCAP 126* 139* 168* 151* 176*   A:  No active issues. P:   No interventions required.   NEUROLOGIC  Head CT:  10/10 >>> nad  A:  Acute encephalopathy. P:   Goal RASS 0 to -1 Daily WUA Fentanyl / versed gtt   BEST PRACTICE / DISPOSITION Level of Care:  ICU >> to floor bed Primary Service:  PCCM Consultants:  None Code Status:  Full Diet:  regular DVT Px:  Protonix GI Px:  Heparin Skin Integrity:  Intact Social / Family:    Michele Mcalpine, M.D. 08/11/2012, 9:16 AM

## 2012-08-12 LAB — BASIC METABOLIC PANEL
CO2: 33 mEq/L — ABNORMAL HIGH (ref 19–32)
Chloride: 97 mEq/L (ref 96–112)
Creatinine, Ser: 0.69 mg/dL (ref 0.50–1.10)
GFR calc Af Amer: >90 mL/min (ref >90)
Potassium: 3.3 mEq/L — ABNORMAL LOW (ref 3.5–5.1)
Sodium: 138 mEq/L (ref 135–145)

## 2012-08-12 LAB — GLUCOSE, CAPILLARY
Glucose-Capillary: 89 mg/dL (ref 70–99)
Glucose-Capillary: 104 mg/dL — ABNORMAL HIGH (ref 70–99)
Glucose-Capillary: 141 mg/dL — ABNORMAL HIGH (ref 70–99)
Glucose-Capillary: 145 mg/dL — ABNORMAL HIGH (ref 70–99)

## 2012-08-12 MED ORDER — BUDESONIDE-FORMOTEROL FUMARATE 160-4.5 MCG/ACT IN AERO
2.0000 | INHALATION_SPRAY | Freq: Two times a day (BID) | RESPIRATORY_TRACT | Status: DC
  Administered 2012-08-13: 2 via RESPIRATORY_TRACT
  Filled 2012-08-12: qty 6

## 2012-08-12 MED ORDER — TIOTROPIUM BROMIDE MONOHYDRATE 18 MCG IN CAPS
18.0000 ug | ORAL_CAPSULE | Freq: Every day | RESPIRATORY_TRACT | Status: DC
  Filled 2012-08-12: qty 5

## 2012-08-12 MED ORDER — ALPRAZOLAM 0.5 MG PO TABS
0.5000 mg | ORAL_TABLET | Freq: Every evening | ORAL | Status: DC | PRN
  Administered 2012-08-12: 0.5 mg via ORAL
  Filled 2012-08-12: qty 1

## 2012-08-12 MED ORDER — IPRATROPIUM BROMIDE HFA 17 MCG/ACT IN AERS
2.0000 | INHALATION_SPRAY | Freq: Four times a day (QID) | RESPIRATORY_TRACT | Status: DC
  Administered 2012-08-13: 2 via RESPIRATORY_TRACT
  Filled 2012-08-12: qty 12.9

## 2012-08-12 NOTE — Progress Notes (Signed)
Physical Therapy Treatment Patient Details Name: Heidi [name] MRN: [id number] DOB: 12/26/1955 Today's Date: 08/12/2012 Time: 1035-1100 PT Time Calculation (min): 25 min  PT Assessment / Plan / Recommendation Comments on Treatment Session  Patient ambulated on RA with O2 remaining >96% throughout. RN was made aware. Patient educated on energy conservation and encouraged to ambulate daily and increase activity as tolerated.     Follow Up Recommendations  No PT follow up     Does the patient have the potential to tolerate intense rehabilitation     Barriers to Discharge        Equipment Recommendations  None recommended by PT    Recommendations for Other Services    Frequency     Plan All goals met and education completed, patient dischaged from PT services    Precautions / Restrictions Precautions Precautions: None   Pertinent Vitals/Pain     Mobility  Bed Mobility Supine to Sit: 7: Independent Sitting - Scoot to Edge of Bed: 7: Independent Sit to Supine: 7: Independent Transfers Sit to Stand: 7: Independent Stand to Sit: 7: Independent Ambulation/Gait Ambulation/Gait Assistance: 7: Independent Ambulation Distance (Feet): 300 Feet Assistive device: None Gait Pattern: Within Functional Limits    Exercises     PT Diagnosis:    PT Problem List:   PT Treatment Interventions:     PT Goals Acute Rehab PT Goals PT Goal: Supine/Side to Sit - Progress: Met PT Goal: Sit to Supine/Side - Progress: Met PT Goal: Sit to Stand - Progress: Met PT Goal: Stand to Sit - Progress: Met PT Goal: Ambulate - Progress: Met  Visit Information  Last PT Received On: 08/12/12 Assistance Needed: +1    Subjective Data      Cognition  Overall Cognitive Status: Appears within functional limits for tasks assessed/performed Arousal/Alertness: Awake/alert Orientation Level: Appears intact for tasks assessed Behavior During Session: [facility] for tasks performed    Balance     End of  Session PT - End of Session Activity Tolerance: Patient tolerated treatment well Patient left: in chair Nurse Communication: Mobility status   GP     Fredrich Birks 08/12/2012, 2:49 PM 08/12/2012 Fredrich Birks PTA [phone] pager [phone] office

## 2012-08-12 NOTE — Progress Notes (Addendum)
Name: Heidi [name] MRN: [id number] DOB: Nov 07, 1955    LOS: 4  Referring Provider:  EDP Reason for Referral:  Acute respiratory failure  PULMONARY / CRITICAL CARE MEDICINE  Brief patient description:  56 y/o with COPD admitted 10/10 with acute respiratory distress requiring intubation.  Events Since Admission: 10/10  Admitted with acute respiratory distress requiring brief intubation. 10/11  Tx to 6N- room8 10/12  Stable- breathing easily w/o distress 10/14  No distress, cough with mod sputum production   Current Status: Awake, no distress.   Complains of cough.     Vital Signs: Temp:  [97.9 F (36.6 C)-98.1 F (36.7 C)] 98.1 F (36.7 C) (10/14 0535) Pulse Rate:  [58-86] 58  (10/14 0535) Resp:  [18-20] 18  (10/14 0535) BP: (95-112)/(54-65) 103/54 mmHg (10/14 0535) SpO2:  [97 %-100 %] 100 % (10/14 0756)  Intake/Output Summary (Last 24 hours) at 08/12/12 1024 Last data filed at 08/12/12 0536  Gross per 24 hour  Intake    720 ml  Output   1350 ml  Net   -630 ml   Physical Examination: General:  Ill appearing, older than stated age Neuro:  awake, nonfocal HEENT:  PERRL Neck:  No JVD Cardiovascular:  RRR, no m/r/g Lungs:  Bilateral diminished air entry, distant, no wheeze Abdomen:  Soft, nontender, nondistended, bowel sounds present Musculoskeletal:  Moves all extremities Skin:  No rash  Active Problems:  Acute respiratory failure  Acute encephalopathy  COPD with acute exacerbation  Lactic acidosis  ASSESSMENT AND PLAN  PULMONARY  Lab 08/08/12 1117 08/08/12 0101  PHART 7.332* 7.206*  PCO2ART 47.3* 46.5*  PO2ART 168.0* 612.0*  HCO3 25.1* 18.4*  O2SAT 99.0 100.0    CXR:  10/11>>Interval removal of support device.  Stable appearance of the heart and lungs ETT:  10/10 >>> 10/10  A:  AECOPD.   Acute respiratory failure.  P:   Goal SpO2 > 92, pH > 7.30 Symbicort started 10/14; she can't tolerate Spiriva (failed as outpt due to cough) Schedule  atrovent changed to PO Pred 10/13   CARDIOVASCULAR  Lab 08/08/12 1425 08/08/12 0830 08/08/12 0821 08/08/12 0306 08/08/12 0223 08/08/12 0105 08/08/12 0037  TROPONINI 1.46* 1.10* -- -- 0.66* -- <0.30  LATICACIDVEN 0.9 -- 1.0 3.3* -- 7.3* --  PROBNP -- -- -- -- -- -- --   ECG:  10/10 >>> Sinus tachycardia, no ST-T changes Lines: L IJ TLC 10/10 >>> 10/10  A:  Transient hypotension after sedated for intubation.  No arrhythmia / ischemia.   Lactic acidosis- likley secondary to hypoxia, quickly improved.  P:  Hold antihypertensives, BP remains soft to normotensive Goal MAP > 60   RENAL  Lab 08/09/12 0335 08/08/12 0821 08/08/12 0035  NA 141 138 135  K 4.0 3.9 --  CL 106 103 100  CO2 27 25 15*  BUN 7 7 12   CREATININE 0.58 0.54 0.87  CALCIUM 9.3 7.8* 8.4  MG -- -- --  PHOS -- -- --   Intake/Output      10/13 0701 - 10/14 0700 10/14 0701 - 10/15 0700   P.O. 960    I.V. (mL/kg) 40 (0.7)    Total Intake(mL/kg) 1000 (18.3)    Urine (mL/kg/hr) 1350 (1)    Total Output 1350    Net -350          Foley:  10/10 >>> 10/10  A:   Normal renal function / electrolytes.   P:  Trend BMP   GASTROINTESTINAL  Lab  08/09/12 0335 08/08/12 0821  AST 34 72*  ALT 34 48*  ALKPHOS 92 99  BILITOT 0.2* 0.4  PROT 6.4 6.3  ALBUMIN 3.5 3.6   A:   No active issues.  P:   PO diet   HEMATOLOGIC  Lab 08/09/12 0335 08/08/12 0821 08/08/12 0206 08/08/12 0035  HGB 11.8* 12.0 -- 11.8*  HCT 34.9* 34.4* -- 34.9*  PLT 218 219 -- 253  INR -- -- 1.01 --  APTT -- -- -- --   A:   Mild anemia.  P:  Trend CBC   INFECTIOUS  Lab 08/09/12 0335 08/08/12 0821 08/08/12 0300 08/08/12 0035  WBC 9.7 9.3 -- 8.6  PROCALCITON 0.14 -- <0.10 --   Cultures: 10/10  Blood >>> 10/10  Respiratory >>> normal flora 10/10  Urine >> negative 10/10 legionella Ag >> negative 10/10 pneumococcal Ag >> negative  Antibiotics: Avelox 10/10 x 1 Flagyl 10/10 x 1 Vancomycin 10/10 x 1 Levaquin 10/10 >>>  10/11  A:   No clear source of infection.  P:   - d/c levaquin 10/11 and follow clinically   ENDOCRINE  Lab 08/12/12 0800 08/11/12 2213 08/11/12 1704 08/11/12 1207 08/11/12 0805  GLUCAP 89 182* 111* 129* 126*   A:   No active issues.  P:   No interventions required.   NEUROLOGIC  Head CT:  10/10 >>> nad  A:   Acute encephalopathy - in setting of hypoxia.  Resolved.   P:   -CT head neg, resolved.    BEST PRACTICE / DISPOSITION Level of Care:  floor bed Primary Service:  PCCM Consultants:  None Code Status:  Full Diet:  regular DVT Px:  Protonix GI Px:  Heparin Skin Integrity:  Intact Social / Family:    Plan d/c to home 10/15   Canary Brim, NP-C [facility] Pgr: [phone] or [phone]   Levy Pupa, MD, PhD 08/12/2012, 3:14 PM [facility] [phone] or if no answer [phone]

## 2012-08-12 NOTE — Discharge Summary (Signed)
Physician Discharge Summary  Patient ID: Heidi [name] MRN: [id number] DOB/AGE: 1956-03-03 56 y.o.  Admit date: 08/08/2012 Discharge date: 08/13/2012    Discharge Diagnoses:  Acute respiratory failure COPD with acute exacerbation Acute encephalopathy Lactic acidosis Hypotension  History of Hypertension    Brief Summary: Heidi [name] is a 56 y.o. y/o female with a PMH of NSTEMI 12/2011 in setting of Takotsubo, COPD who was found in a parking lot holding her inhaler & tripoding.  She subsequently respiratory arrested and was intubated in the ER.     Lines/tubes: L IJ TLC 10/10 >>> 10/10 OETT 10/10 0000>>>>10/10 1145 am  Microbiology/Sepsis markers: 10/10 Blood >>>  10/10 Respiratory >>> normal flora  10/10 Urine >> negative  10/10 legionella Ag >> negative  10/10 pneumococcal Ag >> negative  Antibiotics:  Avelox 10/10 x 1  Flagyl 10/10 x 1  Vancomycin 10/10 x 1  Levaquin 10/10 >>> 10/11    Significant Diagnostic Studies:  10/10 CT HEAD>>>Mild for age nonspecific white matter changes. No acute intracranial abnormality                                                                      Hospital Summary by Discharge Diagnosis   Acute respiratory failure COPD with acute exacerbation Initially presented 10/10 to [facility] ED with severe respiratory distress - found in a parking lot holding her inhaler and tripoding. Intubated in ER in the early hours of 10/10 and admitted to ICU.  CXR with no overt airspace disease. Required brief mechanical ventilation with extubation on 10/10 with no further airway interventions.  She had walking oximetry check on room air. Her saturations stayed above 96% without report of dyspnea.   Discharge Plan: -Resume symbicort -did not tolerate spiriva on previous attempts due to cough -continue Ipratropium HFA -continue nocturnal O2 -slow prednisone taper   Acute encephalopathy Initially presented with altered mental status in the  setting of AECOPD / acute hypoxic / hypercarbic respiratory failure.    Discharge Plan: -No further interventions.    Lactic acidosis Hypotension Hx of HTN Lactic acidosis in setting of mild hypotension and hypoxia.  Known history of hypertension on cozaar.  Medications held during admit due to hypotension.  At time of discharge, SBP 100's - normal.    Discharge Plan: -hold cozaar, consider restart on outpatient basis   Filed Vitals:   08/12/12 1900 08/12/12 2126 08/13/12 0513 08/13/12 0818  BP:  141/68 102/47   Pulse:  77 62   Temp:  98.1 F (36.7 C) 97.6 F (36.4 C)   TempSrc:  Oral Oral   Resp:  19 18   Height:      Weight:      SpO2: 98% 98% 100% 99%   Physical Examination:  General: Ill appearing, older than stated age  Neuro: awake, nonfocal  HEENT: PERRL  Neck: No JVD  Cardiovascular: RRR, no m/r/g  Lungs: Bilateral diminished air entry, distant, no wheeze  Abdomen: Soft, nontender, nondistended, bowel sounds present  Musculoskeletal: Moves all extremities  Skin: No rash   Discharge Labs  BMET  Lab 08/12/12 1322 08/09/12 0335 08/08/12 0821 08/08/12 0035  NA 138 141 138 135  K 3.3* 4.0 -- --  CL 97 106 103 100  CO2 33* 27 25 15*  GLUCOSE 177* 133* 175* 212*  BUN 8 7 7 12   CREATININE 0.69 0.58 0.54 0.87  CALCIUM 8.7 9.3 7.8* 8.4  MG -- -- -- --  PHOS -- -- -- --   CBC  Lab 08/13/12 0445 08/09/12 0335 08/08/12 0821  HGB 12.2 11.8* 12.0  HCT 36.3 34.9* 34.4*  WBC 7.7 9.7 9.3  PLT 228 218 219   Anti-Coagulation  Lab 08/08/12 0206  INR 1.01     Discharge Orders    Future Appointments: Provider: Department: Dept Phone: Center:   08/15/2012 3:15 PM Julio Sicks, NP [facility] [phone] None     Future Orders Please Complete By Expires   Diet - low sodium heart healthy      Increase activity slowly      Call MD for:  temperature >100.4      (HEART FAILURE PATIENTS) Call MD:  Anytime you have any of the following symptoms: 1) 3  pound weight gain in 24 hours or 5 pounds in 1 week 2) shortness of breath, with or without a dry hacking cough 3) swelling in the hands, feet or stomach 4) if you have to sleep on extra pillows at night in order to breathe.      Call MD for:  persistant dizziness or light-headedness        Follow-up Information    Follow up with PARRETT,TAMMY, NP. On 08/15/2012. (Appt at 3:15 PM..  Arrive at 3 pm)    Contact information:   [facility] [address] [place] [address] [phone]         DISCHARGE MEDICATIONS     Medication List     As of 08/13/2012 10:26 AM    START taking these medications         ipratropium 17 MCG/ACT inhaler   Commonly known as: ATROVENT HFA   Inhale 2 puffs into the lungs 4 (four) times daily.      predniSONE 10 MG tablet   Commonly known as: DELTASONE   4 tabs qd x 3d, then 3 tab qd x 3d, then 2 tab qd x 3d, then 1 tab qd x 3 d and d/c      CHANGE how you take these medications         * albuterol 108 (90 BASE) MCG/ACT inhaler   Commonly known as: PROVENTIL HFA;VENTOLIN HFA   Inhale 2 puffs into the lungs every 6 (six) hours as needed for wheezing or shortness of breath. Shortness of breath   What changed: reasons to take the med     * Notice: This list has 1 medication(s) that are the same as other medications prescribed for you. Read the directions carefully, and ask your doctor or other care provider to review them with you.    CONTINUE taking these medications         * albuterol (2.5 MG/3ML) 0.083% nebulizer solution   Commonly known as: PROVENTIL   Take 3 mLs (2.5 mg total) by nebulization every 4 (four) hours as needed for wheezing or shortness of breath.      aspirin 81 MG EC tablet   Take 1 tablet (81 mg total) by mouth daily.      budesonide-formoterol 160-4.5 MCG/ACT inhaler   Commonly known as: SYMBICORT      losartan 25 MG tablet   Commonly known as: COZAAR   Take 1 tablet (25 mg total) by mouth daily.  OXYGEN-HELIUM IN      sodium chloride 0.65 % Soln nasal spray   Commonly known as: OCEAN   Place 1 spray into the nose as needed for congestion.     * Notice: This list has 1 medication(s) that are the same as other medications prescribed for you. Read the directions carefully, and ask your doctor or other care provider to review them with you.    STOP taking these medications         ipratropium 0.02 % nebulizer solution   Commonly known as: ATROVENT          Where to get your medications    These are the prescriptions that you need to pick up. We sent them to a specific pharmacy, so you will need to go there to get them.   [id number] - [place] ([place]), [place] - [address]    [address] [place] ([place] [address]    Phone: [phone]        albuterol 108 (90 BASE) MCG/ACT inhaler   ipratropium 17 MCG/ACT inhaler   predniSONE 10 MG tablet              Disposition: 01-Home or Self Care  Discharged Condition: Heidi [name] has met maximum benefit of inpatient care and is medically stable and cleared for discharge.  Patient is pending follow up as above.      Time spent on disposition:  Greater than 35 minutes.   Signed: Canary Brim, NP-C [facility] Pgr: [phone]  Levy Pupa, MD [facility] 08/13/12

## 2012-08-13 ENCOUNTER — Inpatient Hospital Stay: Payer: Medicaid Other | Admitting: Internal Medicine

## 2012-08-13 LAB — CBC
HCT: 36.3 % (ref 36.0–46.0)
Hemoglobin: 12.2 g/dL (ref 12.0–15.0)
MCV: 100 fL (ref 78.0–100.0)
RBC: 3.63 MIL/uL — ABNORMAL LOW (ref 3.87–5.11)
RDW: 12.5 % (ref 11.5–15.5)
WBC: 7.7 10*3/uL (ref 4.0–10.5)

## 2012-08-13 LAB — GLUCOSE, CAPILLARY: Glucose-Capillary: 113 mg/dL — ABNORMAL HIGH (ref 70–99)

## 2012-08-13 MED ORDER — PREDNISONE 10 MG PO TABS: ORAL_TABLET | ORAL | Status: DC

## 2012-08-13 MED ORDER — ALBUTEROL SULFATE HFA 108 (90 BASE) MCG/ACT IN AERS: 2.0000 | INHALATION_SPRAY | Freq: Four times a day (QID) | RESPIRATORY_TRACT | Status: DC | PRN

## 2012-08-13 MED ORDER — IPRATROPIUM BROMIDE HFA 17 MCG/ACT IN AERS: 2.0000 | INHALATION_SPRAY | Freq: Four times a day (QID) | RESPIRATORY_TRACT | Status: DC

## 2012-08-14 LAB — CULTURE, BLOOD (ROUTINE X 2)
Culture: NO GROWTH
Culture: NO GROWTH

## 2012-08-15 ENCOUNTER — Encounter: Payer: Self-pay | Admitting: Adult Health

## 2012-08-15 ENCOUNTER — Ambulatory Visit (INDEPENDENT_AMBULATORY_CARE_PROVIDER_SITE_OTHER): Payer: Medicaid Other | Admitting: Adult Health

## 2012-08-15 VITALS — BP 94/60 | HR 87 | Temp 97.7°F | Ht 66.0 in | Wt 120.8 lb

## 2012-08-15 DIAGNOSIS — J449 Chronic obstructive pulmonary disease, unspecified: Secondary | ICD-10-CM

## 2012-08-15 DIAGNOSIS — J4489 Other specified chronic obstructive pulmonary disease: Secondary | ICD-10-CM

## 2012-08-15 NOTE — Addendum Note (Signed)
Addended by: Boone Master E on: 08/15/2012 05:07 PM   Modules accepted: Orders

## 2012-08-15 NOTE — Progress Notes (Signed)
Agree with DC from PT.  Shiquita Collignon PT [phone]  

## 2012-08-15 NOTE — Patient Instructions (Addendum)
Continue on Symbicort 2 puffs Twice daily   Continue on Atrovent 2 puffs  Four times a day   Flu shot today .  Finish prednisone taper as directed  follow up Dr. Sherene Sires  In 2 weeks and As needed

## 2012-08-15 NOTE — Progress Notes (Signed)
Subjective:     Patient ID: Heidi [name], female   DOB: 22-Aug-1956  MRN: [id number]  HPI 56yowf quit smoking 01/23/2012 at [facility] with aecopd  With GOLD III COPD superimposed on chronic doe since around 2008 previously controlled with prn inhalers like combivent but since 2011 using these daily.   Admit date: 01/23/2012  D/C date: 01/25/2012   Primary Discharge Diagnoses:  1. NSTEMI secondary to possible Takotsubo cardiomyopathy  - EF 30-35% by echo this admission  - no evidence for obstructive CAD by cath 01/23/12  2. Acute respiratory failure prior to admission  3. Hypotension, limiting medication titration  4. Severe COPD   Admit date: 01/31/2012  Discharge date: 02/02/2012  Primary Care Physician: Lehman Prom, NP, NP  Discharge Diagnoses:  Active Problems:  TOBACCO ABUSE  COPD  Hx of MI < 8 weeks  Takotsubo cardiomyopathy    02/26/2012 1st pulmonary ov/ Wert post hosp doe not back to baseline = Oct no 02 doe x [employer] grocery store but didn't need 02 and supposed to be on symbicort since discharge  but hand not used it on day of ov and doe x [employer] store  and over using her neb and hfa's.  No cough or purulent sputum. rec Symbicort 160 Take 2 puffs first thing in am and then another 2 puffs about 12 hours later.  Only use your albuterol/atrovent  as a rescue medication to be used if you can't catch your breath by resting or doing a relaxed purse lip breathing pattern. The less you use it, the better it will work when you need it. -  Use nebulizer to back up the inhalers    03/27/2012 f/u ov/Wert not smoking cc breathing better likes ventolin better proventil, has not been able to tolerate spiriva in past, no trouble with atrovent. No cough. Present doe x > slow walking flat maybe 200 ft off 02.  No change on 02, not much change p saba or daytime need. rec Atrovent (green tip) to take 2 puffs after symbicort and a total of 2 puffs 4x daily (2puffs bfast, lunch, supper and  bedtime) as a maintenance   Just use the 02 at bedtime - 2lpm should be enough  06/27/2012 f/u ov/Wert thoroughly confused with last set of instructions mixing maint and prns, nebs and hfa. No change doe.  No unusual cough, purulent sputum or sinus/hb symptoms on present rx. rec Plan A Symbicort 160 Take 2 puffs first thing in am and then another 2 puffs about 12 hours later.              Atrovent either the puffer or neb is used 4 x daily (ok to use right after symbicort)   Plan B if you can't catch your breath after Plan A is being done correctly and consistently, then ok to use albuterol in hfa/puffer/inhaler  Plan C if Plan B not working and still can't catch your breath, ok to use the nebulizer up to every 3 hours  Plan D = Call the Doctor  PLan E = ER   08/15/2012 Post Hospital Follow up  Patient returns for a post hospital followup. Patient was admitted October 10 2 October 15 for acute respiratory failure secondary to  severe COPD, exacerbation. Patient was found in the parking lot in respiratory distress, requiring intubation in the emergency room She was treated with brief mechanical ventilation. Along with aggressive pulmonary hygiene, steroids and abx . CXR w/ chronic changes .  CX  were neg.  She did have some hypotension and lactic acidosis and her Cozaar was held  Today in the office. She is feeling better w/ decreased dyspnea and cough.  Still feels weak and tired.  Remains on Symbicort Twice daily  And Atrovent Four times a day   Currently on prednisone taper.  Has used her SABA /Albuterol x 1 yesterday.      ROS:  Constitutional:   No  weight loss, night sweats,  Fevers, chills,  +fatigue, or  lassitude.  HEENT:   No headaches,  Difficulty swallowing,  Tooth/dental problems, or  Sore throat,                No sneezing, itching, ear ache, nasal congestion, post nasal drip,   CV:  No chest pain,  Orthopnea, PND, swelling in lower extremities, anasarca, dizziness,  palpitations, syncope.   GI  No heartburn, indigestion, abdominal pain, nausea, vomiting, diarrhea, change in bowel habits, loss of appetite, bloody stools.   Resp:   No coughing up of blood.  No change in color of mucus.  No wheezing.  No chest wall deformity  Skin: no rash or lesions.  GU: no dysuria, change in color of urine, no urgency or frequency.  No flank pain, no hematuria   MS:  No joint pain or swelling.  No decreased range of motion.  No back pain.  Psych:  .  No memory loss.                       Objective:      Wt 114 02/26/2012 > 03/27/2012  115 > 06/27/2012  117> 08/08/2012 >120 08/15/2012    GEN: A/Ox3; pleasant , NAD Thin frail   HEENT:  [place]/AT,  EACs-clear, TMs-wnl, NOSE-clear, THROAT-clear, no lesions, no postnasal drip or exudate noted.   NECK:  Supple w/ fair ROM; no JVD; normal carotid impulses w/o bruits; no thyromegaly or nodules palpated; no lymphadenopathy.  RESP  Coarse BS  w/o, wheezes/ rales/ or rhonchi.no accessory muscle use, no dullness to percussion  CARD:  RRR, no m/r/g  , no peripheral edema, pulses intact, no cyanosis or clubbing.  GI:   Soft & nt; nml bowel sounds; no organomegaly or masses detected.  Musco: Warm bil, no deformities or joint swelling noted.   Neuro: alert, no focal deficits noted.    Skin: Warm, no lesions or rashes      Assessment:          Plan:

## 2012-08-15 NOTE — Assessment & Plan Note (Addendum)
COPD exacerbation, with respiratory failure require brief mechanical ventilation support Now slowly improving  Plan Continue on Symbicort 2 puffs Twice daily   Continue on Atrovent 2 puffs  Four times a day   Flu shot today .  Finish prednisone taper as directed  follow up Dr. Sherene Sires  In 2 weeks and As needed   Remain off cozaar as b/p remains low

## 2012-08-28 ENCOUNTER — Inpatient Hospital Stay: Payer: Self-pay | Admitting: Specialist

## 2012-08-28 DIAGNOSIS — I214 Non-ST elevation (NSTEMI) myocardial infarction: Secondary | ICD-10-CM

## 2012-08-28 LAB — BASIC METABOLIC PANEL
Anion Gap: 12 (ref 7–16)
BUN: 16 mg/dL (ref 7–18)
BUN: 14 mg/dL (ref 7–18)
Chloride: 105 mmol/L (ref 98–107)
Chloride: 105 mmol/L (ref 98–107)
Creatinine: 0.93 mg/dL (ref 0.60–1.30)
Creatinine: 0.94 mg/dL (ref 0.60–1.30)
EGFR (African American): >60
EGFR (African American): >60
EGFR (Non-African Amer.): >60
Glucose: 120 mg/dL — ABNORMAL HIGH (ref 65–99)
Potassium: 4.1 mmol/L (ref 3.5–5.1)
Sodium: 143 mmol/L (ref 136–145)

## 2012-08-28 LAB — APTT
Activated PTT: 26.3 secs (ref 23.6–35.9)
Activated PTT: 72.3 secs — ABNORMAL HIGH (ref 23.6–35.9)

## 2012-08-28 LAB — TROPONIN I
Troponin-I: 0.1 ng/mL — ABNORMAL HIGH
Troponin-I: 0.3 ng/mL — ABNORMAL HIGH

## 2012-08-28 LAB — CBC WITH DIFFERENTIAL/PLATELET
Basophil #: 0 10*3/uL (ref 0.0–0.1)
Eosinophil #: 0 10*3/uL (ref 0.0–0.7)
Lymphocyte #: 0.8 10*3/uL — ABNORMAL LOW (ref 1.0–3.6)
MCHC: 33.9 g/dL (ref 32.0–36.0)
MCV: 101 fL — ABNORMAL HIGH (ref 80–100)
Monocyte #: 0.2 x10 3/mm (ref 0.2–0.9)
Monocyte %: 2.8 %
Neutrophil %: 87.1 %
Platelet: 221 10*3/uL (ref 150–440)
RDW: 13.1 % (ref 11.5–14.5)
WBC: 8.2 10*3/uL (ref 3.6–11.0)

## 2012-08-28 LAB — CBC
HCT: 37.8 % (ref 35.0–47.0)
HGB: 12.9 g/dL (ref 12.0–16.0)
MCH: 34.2 pg — ABNORMAL HIGH (ref 26.0–34.0)
MCHC: 34.1 g/dL (ref 32.0–36.0)
MCV: 100 fL (ref 80–100)
RBC: 3.76 10*6/uL — ABNORMAL LOW (ref 3.80–5.20)
RDW: 12.8 % (ref 11.5–14.5)

## 2012-08-28 LAB — DRUG SCREEN, URINE
Cannabinoid 50 Ng, Ur ~~LOC~~: NEGATIVE (ref ?–50)
MDMA (Ecstasy)Ur Screen: NEGATIVE (ref ?–500)
Phencyclidine (PCP) Ur S: NEGATIVE (ref ?–25)

## 2012-08-28 LAB — CK TOTAL AND CKMB (NOT AT ARMC)
CK, Total: 112 U/L (ref 21–215)
CK-MB: 4.6 ng/mL — ABNORMAL HIGH (ref 0.5–3.6)
CK-MB: 5.2 ng/mL — ABNORMAL HIGH (ref 0.5–3.6)

## 2012-08-29 DIAGNOSIS — R079 Chest pain, unspecified: Secondary | ICD-10-CM

## 2012-08-30 ENCOUNTER — Ambulatory Visit: Payer: Medicaid Other | Admitting: Internal Medicine

## 2012-08-30 ENCOUNTER — Telehealth: Payer: Self-pay

## 2012-08-30 NOTE — Telephone Encounter (Signed)
TCM call Pt was d/c from [facility] 10/31 for sob secondary to COPD /anxiety with positive hx Takutsubo syndrome Since d/c she states sob has improved and she was given rx for alprazolam to take PRN anxiety. She took this last night and was able to rest well. She confirms she has all meds she needs. No meds were stopped at d/c She also confirms appt with [place] 09/06/12 She will call us sooner should she have questions or concerns She denies any other symptoms such as worsening sob or cp

## 2012-08-30 NOTE — Telephone Encounter (Signed)
Message copied by Marcelle Overlie on Fri Aug 30, 2012  9:05 AM ------      Message from: Kendrick Fries      Created: Fri Aug 30, 2012  9:03 AM       TCM      Discharged [facility] on 08/29/12.      Appoint: [facility] 09/06/12

## 2012-09-05 ENCOUNTER — Ambulatory Visit (INDEPENDENT_AMBULATORY_CARE_PROVIDER_SITE_OTHER): Payer: Medicaid Other | Admitting: Internal Medicine

## 2012-09-05 ENCOUNTER — Encounter: Payer: Self-pay | Admitting: Internal Medicine

## 2012-09-05 VITALS — BP 106/70 | HR 67 | Temp 98.1°F | Ht 64.0 in | Wt 119.0 lb

## 2012-09-05 DIAGNOSIS — J449 Chronic obstructive pulmonary disease, unspecified: Secondary | ICD-10-CM

## 2012-09-05 MED ORDER — ALPRAZOLAM 0.5 MG PO TABS: 0.5000 mg | ORAL_TABLET | Freq: Three times a day (TID) | ORAL | Status: DC | PRN

## 2012-09-05 MED ORDER — ACLIDINIUM BROMIDE 400 MCG/ACT IN AEPB: 1.0000 | INHALATION_SPRAY | Freq: Two times a day (BID) | RESPIRATORY_TRACT | Status: DC

## 2012-09-05 NOTE — Progress Notes (Signed)
Subjective:     Patient ID: XIOMARA SEVILLANO, female   DOB: 08-04-1956  MRN: [id number]  HPI 56yowf quit smoking 01/23/2012 at [facility] with aecopd  With GOLD III COPD superimposed on chronic doe since around 2008 previously controlled with prn inhalers like combivent but since 2011 using these daily.   Admit date: 01/23/2012  D/C date: 01/25/2012   Primary Discharge Diagnoses:  1. NSTEMI secondary to possible Takotsubo cardiomyopathy  - EF 30-35% by echo this admission  - no evidence for obstructive CAD by cath 01/23/12  2. Acute respiratory failure prior to admission  3. Hypotension, limiting medication titration  4. Severe COPD   Admit date: 01/31/2012  Discharge date: 02/02/2012  Primary Care Physician: Lehman Prom, NP, NP  Discharge Diagnoses:  Active Problems:  TOBACCO ABUSE  COPD  Hx of MI < 8 weeks  Takotsubo cardiomyopathy    02/26/2012 1st pulmonary ov/ Wert post hosp doe not back to baseline = Oct no 02 doe x [employer] grocery store but didn't need 02 and supposed to be on symbicort since discharge  but hand not used it on day of ov and doe x [employer] store  and over using her neb and hfa's.  No cough or purulent sputum. rec Symbicort 160 Take 2 puffs first thing in am and then another 2 puffs about 12 hours later.  Only use your albuterol/atrovent  as a rescue medication to be used if you can't catch your breath by resting or doing a relaxed purse lip breathing pattern. The less you use it, the better it will work when you need it. -  Use nebulizer to back up the inhalers    03/27/2012 f/u ov/Wert not smoking cc breathing better likes ventolin better proventil, has not been able to tolerate spiriva in past, no trouble with atrovent. No cough. Present doe x > slow walking flat maybe 200 ft off 02.  No change on 02, not much change p saba or daytime need. rec Atrovent (green tip) to take 2 puffs after symbicort and a total of 2 puffs 4x daily (2puffs bfast, lunch, supper and  bedtime) as a maintenance   Just use the 02 at bedtime - 2lpm should be enough  06/27/2012 f/u ov/Wert thoroughly confused with last set of instructions mixing maint and prns, nebs and hfa. No change doe.  No unusual cough, purulent sputum or sinus/hb symptoms on present rx. rec Plan A Symbicort 160 Take 2 puffs first thing in am and then another 2 puffs about 12 hours later.              Atrovent either the puffer or neb is used 4 x daily (ok to use right after symbicort)  Plan B if you can't catch your breath after Plan A is being done correctly and consistently, then ok to use albuterol in hfa/puffer/inhaler Plan C if Plan B not working and still can't catch your breath, ok to use the nebulizer up to every 3 hours Plan D = Call the Doctor PLan E = ER   08/15/2012 Post Hospital Follow up  Patient returns for a post hospital followup. Patient was admitted  October 15 for acute respiratory failure secondary to  severe COPD, exacerbation. Patient was found in the parking lot in respiratory distress, requiring intubation in the emergency room She was treated with brief mechanical ventilation. Along with aggressive pulmonary hygiene, steroids and abx . CXR w/ chronic changes .  CXR were neg.  She did have  some hypotension and lactic acidosis and her Cozaar was held Today in the office. She is feeling better w/ decreased dyspnea and cough.  Still feels weak and tired.  Remains on Symbicort Twice daily  And Atrovent Four times a day   Currently on prednisone taper.  Has used her SABA /Albuterol x 1 yesterday.   Readmitted to [facility] Oct 31 x 2  Days with "panic attack"        09/05/2012 f/u ov/Wert cc breathing better since last admit but still feeling like she needs saba including one hour after symbicort when took xanax use of albuterol went way down, vs when ran out back up to 4 x daily saba now living in [place], only uses 02 at hs.  No obvious daytime variabilty or assoc chronic cough  or cp or chest tightness, subjective wheeze overt sinus or hb symptoms. No unusual exp hx   Sleeping ok without nocturnal  or early am exacerbation  of respiratory  c/o's or need for noct saba. Also denies any obvious fluctuation of symptoms with weather or environmental changes or other aggravating or alleviating factors except as outlined above   ROS  The following are not active complaints unless bolded sore throat, dysphagia, dental problems, itching, sneezing,  nasal congestion or excess/ purulent secretions, ear ache,   fever, chills, sweats, unintended wt loss, pleuritic or exertional cp, hemoptysis,  orthopnea pnd or leg swelling, presyncope, palpitations, heartburn, abdominal pain, anorexia, nausea, vomiting, diarrhea  or change in bowel or urinary habits, change in stools or urine, dysuria,hematuria,  rash, arthralgias, visual complaints, headache, numbness weakness or ataxia or problems with walking or coordination,  change in mood/affect or memory.                          Objective:      Wt 114 02/26/2012 > 03/27/2012  115 > 06/27/2012  117> 08/08/2012 >120 08/15/2012 > 119 09/05/2012    GEN: A/Ox3; pleasant , NAD Thin frail/ anxious  HEENT:  [place]/AT,  EACs-clear, TMs-wnl, NOSE-clear, THROAT-clear, no lesions, no postnasal drip or exudate noted.   NECK:  Supple w/ fair ROM; no JVD; normal carotid impulses w/o bruits; no thyromegaly or nodules palpated; no lymphadenopathy.  RESP  Distant  BS  w/o, wheezes/ rales/ or rhonchi.no accessory muscle use, no dullness to percussion  CARD:  RRR, no m/r/g  , no peripheral edema, pulses intact, no cyanosis or clubbing.  GI:   Soft & nt; nml bowel sounds; no organomegaly or masses detected.  Musco: Warm bil, no deformities or joint swelling noted.   Neuro: alert, no focal deficits noted.    Skin: Warm, no lesions or rashes      Assessment:          Plan:

## 2012-09-05 NOTE — Patient Instructions (Addendum)
Try Sue Lush one twice daily after symbicort as part of plan A  Alprazolam 0.5 mg one half to one three times as needed for nerves  Plan B if you can't catch your breath after Plan A is being done correctly and consistently, then ok to use albuterol in hfa/puffer/inhaler  Plan C if Plan B not working and still can't catch your breath, ok to use the nebulizer up to every 3 hours  Plan D = Call the Doctor  PLan E = ER  One month follow in [place] office, sooner if needed

## 2012-09-06 ENCOUNTER — Encounter: Payer: Medicaid Other | Admitting: Cardiovascular Disease

## 2012-09-06 ENCOUNTER — Telehealth: Payer: Self-pay

## 2012-09-06 NOTE — Telephone Encounter (Signed)
TCM call

## 2012-09-06 NOTE — Telephone Encounter (Signed)
tcm call was made and completed 08/29/12

## 2012-09-06 NOTE — Telephone Encounter (Signed)
Message copied by Marcelle Overlie on Fri Sep 06, 2012  8:34 AM ------      Message from: Thersa Salt      Created: Fri Sep 06, 2012  8:12 AM      Regarding: TCM pt       tcm pt cancelled appt today. Offered her Monday but she declined. rs for 11/15

## 2012-09-07 NOTE — Assessment & Plan Note (Signed)
-   HFA 75%  02/26/2012      - PFT's 03/27/2012 FEV1 0.72 ( 27%) ratio 45 and 18 % better p B2, DLCO 48 corrects to 79%  GOLD IV with tendency to panic related to air trapping and previously intol of spiriva  Try tudorza one bid  The proper method of use, as well as anticipated side effects, of a metered-dose inhaler are discussed and demonstrated to the patient. Improved effectiveness after extensive coaching during this visit to a level of approximately  90% with turdorza  See instructions for specific recommendations which were reviewed directly with the patient who was given a copy with highlighter outlining the key components.

## 2012-09-13 ENCOUNTER — Encounter: Payer: Self-pay | Admitting: Cardiovascular Disease

## 2012-09-13 ENCOUNTER — Ambulatory Visit (INDEPENDENT_AMBULATORY_CARE_PROVIDER_SITE_OTHER): Payer: Medicaid Other | Admitting: Cardiovascular Disease

## 2012-09-13 VITALS — BP 120/80 | HR 85 | Ht 64.0 in | Wt 122.0 lb

## 2012-09-13 DIAGNOSIS — I5181 Takotsubo syndrome: Secondary | ICD-10-CM

## 2012-09-13 DIAGNOSIS — R0789 Other chest pain: Secondary | ICD-10-CM

## 2012-09-13 NOTE — Patient Instructions (Addendum)
Stop Losartan  Follow up as needed 

## 2012-09-13 NOTE — Assessment & Plan Note (Signed)
This has resolved. She actually had 2 echocardiograms one in July at [facility] and a repeat echo recently at [facility]. Both of them showed normal LV systolic function and wall motion. Thus, no further cardiac evaluation is needed. Given that her blood pressure has been running low on losartan, asked her to stop taking the medication. She was congratulated on smoking cessation. Followup with [place] as needed.

## 2012-09-13 NOTE — Progress Notes (Signed)
HPI  This is a 56 year old female who is here today for a followup visit. Early this year, she presented with non-ST elevation myocardial infarction secondary to possible Takotsubo cardiomyopathy. Patient presented with chest pain, ECG changes and elevated cardiac markers. Cardiac catheterization revealed no obstructive coronary disease, ejection fraction 45% and wall motion felt consistent with Takotsubo CM. Echocardiogram showed an ejection fraction of 30-35% with diffuse hypokinesis. Stress induced cardiomyopathy was felt to be due to severe COPD. She had subsequent followup echocardiogram in July showed normal LV systolic function. She presented recently to [facility] with increased dyspnea due to COPD exacerbation. We were again consulted for chest pain. She ruled out for myocardial infarction. Echocardiogram showed normal LV systolic function. She quit smoking since then and has been feeling significantly better. Her blood pressure has been running relatively low on losartan. She has not taken this medication over the last few days.  Allergies  Allergen Reactions  . Codeine Nausea Only     Current Outpatient Prescriptions on File Prior to Visit  Medication Sig Dispense Refill  . albuterol (PROVENTIL) (2.5 MG/3ML) 0.083% nebulizer solution Take 3 mLs (2.5 mg total) by nebulization every 4 (four) hours as needed for wheezing or shortness of breath.  360 mL  3  . ALPRAZolam (XANAX) 0.5 MG tablet Take 1 tablet (0.5 mg total) by mouth 3 (three) times daily as needed for sleep.  90 tablet  5  . aspirin EC 81 MG EC tablet Take 1 tablet (81 mg total) by mouth daily.      . budesonide-formoterol (SYMBICORT) 160-4.5 MCG/ACT inhaler Inhale 2 puffs into the lungs 2 (two) times daily.        Docia Barrier IN Inhale into the lungs. 3 lpm at night and as needed during the day      . sodium chloride (OCEAN) 0.65 % SOLN nasal spray Place 1 spray into the nose as needed for congestion.  1 Bottle    .  [DISCONTINUED] albuterol (PROVENTIL HFA;VENTOLIN HFA) 108 (90 BASE) MCG/ACT inhaler Inhale 2 puffs into the lungs every 6 (six) hours as needed for wheezing or shortness of breath. Shortness of breath  1 Inhaler  3     Past Medical History  Diagnosis Date  . COPD (chronic obstructive pulmonary disease)   . NSTEMI (non-ST elevated myocardial infarction)     12/2011 with normal coronaries possibly secondary to Takotsubo (EF 30-35% by echo), occurred following two episodes of acute respiratory distress  . Hypotension     limiting med titration  . Takotsubo syndrome 4/13     Past Surgical History  Procedure Date  . Abdominal hysterectomy   . Vagina rectal repair     after childbirth  . Cardiac catheterization      Family History  Problem Relation Age of Onset  . Heart attack Mother     deceased from massive MI at age 6  . Liver disease Father     fatty liver; living, age 69  . Stroke Brother     at age 40, living   . Hypertension Brother     living, age 8     History   Social History  . Marital Status: Divorced    Spouse Name: N/A    Number of Children: N/A  . 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 Smoking status: Former Smoker -- 30 years  Types: Cigarettes    Quit date: 01/21/2012  . Smokeless tobacco: Never Used     Comment: smoked 1 ppd X 30 years, has recently cut down to 3 packs per week; has quit smoking entirely X 2 days  . Alcohol Use: Yes     Comment: 1-2 beers per week  . Drug Use: No  . Sexually Active: Not Currently   Other Topics Concern  . Not on file   Social History Narrative   Ms. 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 She has two children, a daughter and son, who live in [place] area. She lives with her brother.     PHYSICAL EXAM   BP 120/80  Pulse 85  Ht 5\' 4"  (1.626 m)  Wt 122 lb (55.339 kg)  BMI 20.94 kg/m2 Constitutional: She is  oriented to person, place, and time. She appears well-developed and well-nourished. No distress.  HENT: No nasal discharge.  Head: Normocephalic and atraumatic.  Eyes: Pupils are equal and round. Right eye exhibits no discharge. Left eye exhibits no discharge.  Neck: Normal range of motion. Neck supple. No JVD present. No thyromegaly present.  Cardiovascular: Normal rate, regular rhythm, normal heart sounds. Exam reveals no gallop and no friction rub. No murmur heard.  Pulmonary/Chest: Effort normal and breath sounds normal. No stridor. No respiratory distress. She has no wheezes. She has no rales. She exhibits no tenderness.  Abdominal: Soft. Bowel sounds are normal. She exhibits no distension. There is no tenderness. There is no rebound and no guarding.  Musculoskeletal: Normal range of motion. She exhibits no edema and no tenderness.  Neurological: She is alert and oriented to person, place, and time. Coordination normal.  Skin: Skin is warm and dry. No rash noted. She is not diaphoretic. No erythema. No pallor.  Psychiatric: She has a normal mood and affect. Her behavior is normal. Judgment and thought content normal.     EKG: Sinus  Rhythm  -  Nonspecific T-abnormality.   ABNORMAL    ASSESSMENT AND PLAN

## 2012-09-26 ENCOUNTER — Emergency Department: Payer: Self-pay | Admitting: Emergency Medicine

## 2012-09-26 LAB — PROTIME-INR
INR: 0.9
Prothrombin Time: 12 secs (ref 11.5–14.7)

## 2012-09-26 LAB — CBC
HCT: 34.1 % — ABNORMAL LOW (ref 35.0–47.0)
MCH: 34.1 pg — ABNORMAL HIGH (ref 26.0–34.0)
MCHC: 34.8 g/dL (ref 32.0–36.0)
Platelet: 230 10*3/uL (ref 150–440)
RBC: 3.47 10*6/uL — ABNORMAL LOW (ref 3.80–5.20)
RDW: 12.4 % (ref 11.5–14.5)
WBC: 10.1 10*3/uL (ref 3.6–11.0)

## 2012-09-26 LAB — BASIC METABOLIC PANEL
BUN: 12 mg/dL (ref 7–18)
Calcium, Total: 9.4 mg/dL (ref 8.5–10.1)
Chloride: 105 mmol/L (ref 98–107)
Co2: 29 mmol/L (ref 21–32)
Osmolality: 281 (ref 275–301)
Potassium: 3.7 mmol/L (ref 3.5–5.1)

## 2012-09-26 LAB — TROPONIN I: Troponin-I: <0.02 ng/mL

## 2012-09-26 LAB — CK TOTAL AND CKMB (NOT AT ARMC): CK-MB: 0.8 ng/mL (ref 0.5–3.6)

## 2012-10-09 ENCOUNTER — Encounter: Payer: Self-pay | Admitting: Cardiovascular Disease

## 2012-10-09 ENCOUNTER — Encounter: Payer: Medicaid Other | Admitting: Internal Medicine

## 2012-10-09 NOTE — Progress Notes (Signed)
 This encounter was created in error - please disregard.

## 2012-10-10 ENCOUNTER — Ambulatory Visit: Payer: Medicaid Other | Admitting: Internal Medicine

## 2012-10-10 ENCOUNTER — Encounter: Payer: Self-pay | Admitting: Internal Medicine

## 2012-10-10 ENCOUNTER — Ambulatory Visit (INDEPENDENT_AMBULATORY_CARE_PROVIDER_SITE_OTHER): Payer: Medicaid Other | Admitting: Internal Medicine

## 2012-10-10 VITALS — BP 126/74 | HR 90 | Temp 97.7°F | Ht 64.0 in | Wt 123.0 lb

## 2012-10-10 DIAGNOSIS — J449 Chronic obstructive pulmonary disease, unspecified: Secondary | ICD-10-CM

## 2012-10-10 DIAGNOSIS — J9610 Chronic respiratory failure, unspecified whether with hypoxia or hypercapnia: Secondary | ICD-10-CM

## 2012-10-10 DIAGNOSIS — R490 Dysphonia: Secondary | ICD-10-CM | POA: Insufficient documentation

## 2012-10-10 DIAGNOSIS — J4489 Other specified chronic obstructive pulmonary disease: Secondary | ICD-10-CM

## 2012-10-10 MED ORDER — IPRATROPIUM BROMIDE HFA 17 MCG/ACT IN AERS: 2.0000 | INHALATION_SPRAY | Freq: Four times a day (QID) | RESPIRATORY_TRACT | Status: DC

## 2012-10-10 MED ORDER — ALBUTEROL SULFATE HFA 108 (90 BASE) MCG/ACT IN AERS: 2.0000 | INHALATION_SPRAY | Freq: Four times a day (QID) | RESPIRATORY_TRACT | Status: DC | PRN

## 2012-10-10 MED ORDER — FAMOTIDINE 20 MG PO TABS: ORAL_TABLET | ORAL | Status: DC

## 2012-10-10 MED ORDER — PANTOPRAZOLE SODIUM 40 MG PO TBEC: 40.0000 mg | DELAYED_RELEASE_TABLET | Freq: Every day | ORAL | Status: DC

## 2012-10-10 NOTE — Patient Instructions (Addendum)
pantoprazole 40 mg Take 30-60 min before first meal of the day and pepcid 20 mg at bedtime  Plan A = symbicort and atrovent perfectly regulalry - your technique is so good you should really start seeing the benefit.  Only use your albuterol (plan B proair puffer, plan C is nebulizer) as a rescue medication to be used if you can't catch your breath by resting or doing a relaxed purse lip breathing pattern. The less you use it, the better it will work when you need it.   GERD (REFLUX)  is an extremely common cause of respiratory symptoms, many times with no significant heartburn at all.    It can be treated with medication, but also with lifestyle changes including avoidance of late meals, excessive alcohol, smoking cessation, and avoid fatty foods, chocolate, peppermint, colas, red wine, and acidic juices such as orange juice.  NO MINT OR MENTHOL PRODUCTS SO NO COUGH DROPS  USE SUGARLESS CANDY INSTEAD (jolley ranchers or Stover's)  NO OIL BASED VITAMINS - use powdered substitutes.    Please schedule a follow up visit in 3 months but call sooner if needed

## 2012-10-10 NOTE — Assessment & Plan Note (Addendum)
-   HFA 90% 10/10/2012     - PFT's 03/27/2012 FEV1 0.72 ( 27%) ratio 45 and 18 % better p B2, DLCO 48 corrects to 79%  GOLD IV but relatively well compensated on symbicort and atrovent plus prn saba but needs to use the hfa before the neb on prn basis    Each maintenance medication was reviewed in detail including most importantly the difference between maintenance and as needed and under what circumstances the prns are to be used.  Please see instructions for details which were reviewed in writing and the patient given a copy.    The proper method of use, as well as anticipated side effects, of a metered-dose inhaler are discussed and demonstrated to the patient. Improved effectiveness after extensive coaching during this visit to a level of approximately  90%

## 2012-10-10 NOTE — Progress Notes (Signed)
Subjective:     Patient ID: Heidi [name], female   DOB: 14-Oct-1956  MRN: [id number]  HPI 89 yowf quit smoking 01/23/2012 at admit to [facility] with aecopd  With GOLD III COPD superimposed on chronic doe since around 2008 previously controlled with prn inhalers like combivent but since 2011 using these daily.   Admit date: 01/23/2012  D/C date: 01/25/2012   Primary Discharge Diagnoses:  1. NSTEMI secondary to possible Takotsubo cardiomyopathy  - EF 30-35% by echo this admission  - no evidence for obstructive CAD by cath 01/23/12  2. Acute respiratory failure prior to admission  3. Hypotension, limiting medication titration  4. Severe COPD   Admit date: 01/31/2012  Discharge date: 02/02/2012  Primary Care Physician: Lehman Prom, NP, NP  Discharge Diagnoses:  Active Problems:  TOBACCO ABUSE  COPD  Hx of MI < 8 weeks  Takotsubo cardiomyopathy    02/26/2012 1st pulmonary ov/ Heidi [name] post hosp doe not back to baseline = Oct no 02 doe x [employer] but didn't need 02 and supposed to be on symbicort since discharge  but hand not used it on day of ov and doe x [employer] store  and over using her neb and hfa's.  No cough or purulent sputum. rec Symbicort 160 Take 2 puffs first thing in am and then another 2 puffs about 12 hours later.  Only use your albuterol/atrovent  as a rescue medication to be used if you can't catch your breath by resting or doing a relaxed purse lip breathing pattern. The less you use it, the better it will work when you need it. -  Use nebulizer to back up the inhalers    03/27/2012 f/u ov/Heidi [name] not smoking cc breathing better likes ventolin better proventil, has not been able to tolerate spiriva in past, no trouble with atrovent. No cough. Present doe x > slow walking flat maybe 200 ft off 02.  No change on 02, not much change p saba or daytime need. rec Atrovent (green tip) to take 2 puffs after symbicort and a total of 2 puffs 4x daily (2puffs bfast, lunch, supper and  bedtime) as a maintenance   Just use the 02 at bedtime - 2lpm should be enough  06/27/2012 f/u ov/Heidi [name] thoroughly confused with last set of instructions mixing maint and prns, nebs and hfa. No change doe.  No unusual cough, purulent sputum or sinus/hb symptoms on present rx. rec Plan A Symbicort 160 Take 2 puffs first thing in am and then another 2 puffs about 12 hours later.              Atrovent either the puffer or neb is used 4 x daily (ok to use right after symbicort)  Plan B if you can't catch your breath after Plan A is being done correctly and consistently, then ok to use albuterol in hfa/puffer/inhaler Plan C if Plan B not working and still can't catch your breath, ok to use the nebulizer up to every 3 hours Plan D = Call the Doctor PLan E = ER   08/15/2012 Post Hospital Follow up  Patient returns for a post hospital followup. Patient was admitted  October 15 for acute respiratory failure secondary to  severe COPD, exacerbation. Patient was found in the parking lot in respiratory distress, requiring intubation in the emergency room She was treated with brief mechanical ventilation. Along with aggressive pulmonary hygiene, steroids and abx . CXR w/ chronic changes .  CXR were neg.  She did  have some hypotension and lactic acidosis and her Cozaar was held Today in the office. She is feeling better w/ decreased dyspnea and cough.  Still feels weak and tired.  Remains on Symbicort Twice daily  And Atrovent Four times a day   Currently on prednisone taper.  Has used her SABA /Albuterol x 1 yesterday.   Readmitted to [facility] Oct 31 x 2  Days with "panic attack"        09/05/2012 f/u ov/Heidi [name] cc breathing better since last admit but still feeling like she needs saba including one hour after symbicort when took xanax use of albuterol went way down, vs when ran out back up to 4 x daily saba now living in [place], only uses 02 at hs. rec Try Sue Lush one twice daily after symbicort as  part of plan A Alprazolam 0.5 mg one half to one three times as needed for nerves Plan B if you can't catch your breath after Plan A is being done correctly and consistently, then ok to use albuterol in hfa/puffer/inhaler Plan C if Plan B not working and still can't catch your breath, ok to use the nebulizer up to every 3 hours Plan D = Call the Doctor PLan E = ER    10/10/2012 f/u ov/Heidi [name] cc ? Benefit from tudorza then stopped it due to cough which seemed to improve p stopped it but still hoarse and excessive urge to clear throat, using saba neb once a day (before goes out) and as has no hfa saba.  Only using 02 at hs. p stopped tudorza resumed qid atrovent hfa    Also reports intermittent severe HB rx with otc antacids helps.  No obvious daytime variabilty or assoc chronic cough or cp or chest tightness, subjective wheeze overt sinus symptoms. No unusual exp hx   Sleeping ok without nocturnal  or early am exacerbation  of respiratory  c/o's or need for noct saba. Also denies any obvious fluctuation of symptoms with weather or environmental changes or other aggravating or alleviating factors except as outlined above   ROS  The following are not active complaints unless bolded sore throat, dysphagia, dental problems, itching, sneezing,  nasal congestion or excess/ purulent secretions, ear ache,   fever, chills, sweats, unintended wt loss, pleuritic or exertional cp, hemoptysis,  orthopnea pnd or leg swelling, presyncope, palpitations, heartburn, abdominal pain, anorexia, nausea, vomiting, diarrhea  or change in bowel or urinary habits, change in stools or urine, dysuria,hematuria,  rash, arthralgias, visual complaints, headache, numbness weakness or ataxia or problems with walking or coordination,  change in mood/affect or memory.                          Objective:      Wt 114 02/26/2012 >  08/08/2012 >120 08/15/2012 > 119 09/05/2012 >  10/10/2012 123   GEN: A/Ox3; pleasant , NAD  Thin frail/ anxious  HEENT:  /AT,  EACs-clear, TMs-wnl, NOSE-clear, THROAT-clear, no lesions, no postnasal drip or exudate noted.   NECK:  Supple w/ fair ROM; no JVD; normal carotid impulses w/o bruits; no thyromegaly or nodules palpated; no lymphadenopathy.  RESP  Distant  BS  w/o, wheezes/ rales/ or rhonchi.no accessory muscle use, no dullness to percussion  CARD:  RRR, no m/r/g  , no peripheral edema, pulses intact, no cyanosis or clubbing.  GI:   Soft & nt; nml bowel sounds; no organomegaly or masses detected.  Musco: Warm bil, no deformities or joint  swelling noted.   Neuro: alert, no focal deficits noted.    Skin: Warm, no lesions or rashes      Assessment:          Plan:

## 2012-10-10 NOTE — Assessment & Plan Note (Signed)
Most likely this is gerd related as she has also noted intermittent hb and it was def worse on dpi, typical of upper airway cough syndrome.  rec trial of ppi and diet

## 2012-10-10 NOTE — Assessment & Plan Note (Signed)
-   03/27/2012   Walked RA x one lap @ 185 stopped due to  Fatigue and sob but no desat so changed to 2lpm hs only  Adequate control on present rx, reviewed

## 2012-11-14 ENCOUNTER — Encounter: Payer: Self-pay | Admitting: Internal Medicine

## 2012-12-18 IMAGING — CR DG CHEST 1V PORT
1 series · 1 of 1 positions shown · non-contrast
Comparison: none

REASON FOR EXAM: Chest Pain
COMMENTS:

[portable]
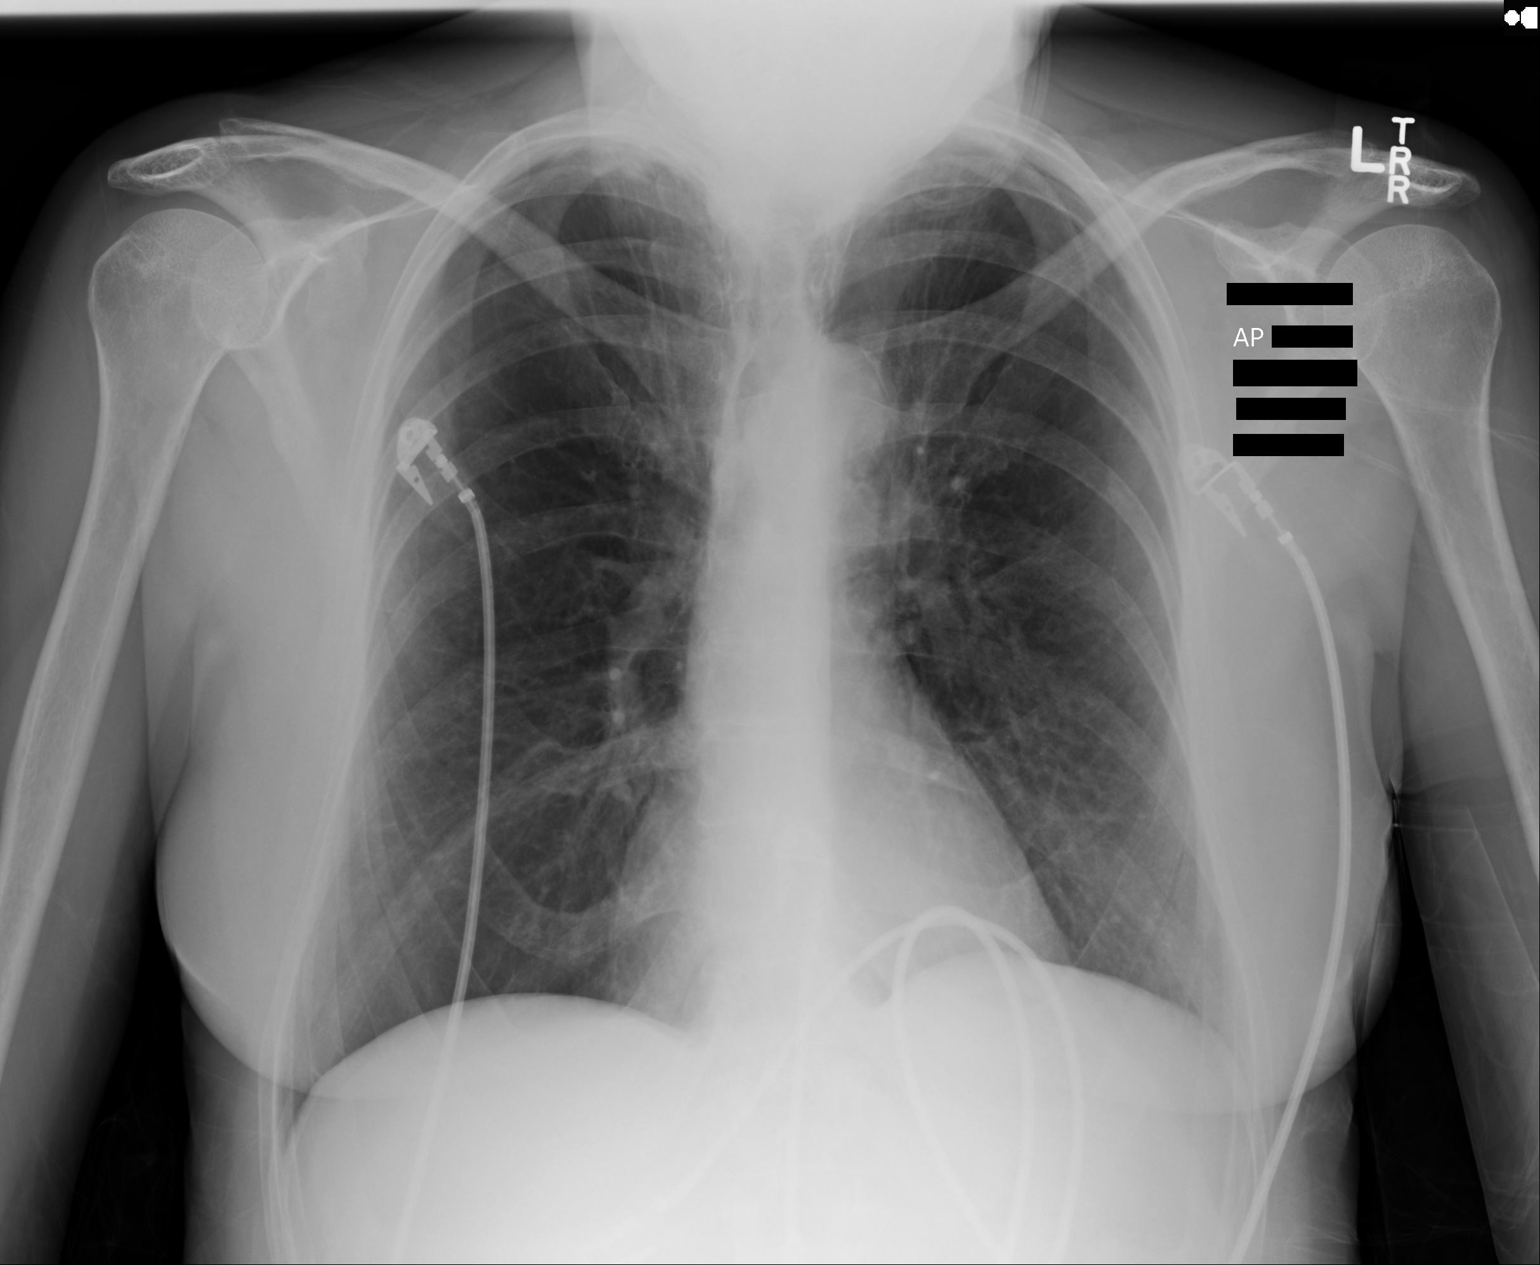

[1 of 1 positions shown; findings below may reference images not displayed]

PROCEDURE:     DXR - DXR PORTABLE CHEST SINGLE VIEW  - September 26, 2012  [DATE]

RESULT:     Comparison is made to the study 28 August, 2012.

Cardiac monitoring electrodes present. The lungs are hyperinflated
consistent with COPD. The chin projects over the lung apices. The cardiac
silhouette is normal. There is no definite edema, infiltrate, effusion or
pneumothorax. Apical fibrosis is present and unchanged in appearance.
IMPRESSION: 1. COPD.
2. Apical fibrosis. No acute cardiopulmonary disease evident.

[REDACTED]

## 2013-01-06 ENCOUNTER — Ambulatory Visit (INDEPENDENT_AMBULATORY_CARE_PROVIDER_SITE_OTHER): Payer: Medicaid Other | Admitting: Internal Medicine

## 2013-01-06 ENCOUNTER — Encounter: Payer: Self-pay | Admitting: Internal Medicine

## 2013-01-06 ENCOUNTER — Ambulatory Visit (INDEPENDENT_AMBULATORY_CARE_PROVIDER_SITE_OTHER)
Admission: RE | Admit: 2013-01-06 | Discharge: 2013-01-06 | Disposition: A | Discharge status: Home / Self Care | Payer: Medicaid Other | Source: Ambulatory Visit | Attending: Internal Medicine | Admitting: Internal Medicine

## 2013-01-06 ENCOUNTER — Ambulatory Visit: Payer: Medicaid Other | Admitting: Internal Medicine

## 2013-01-06 VITALS — BP 112/78 | HR 91 | Temp 98.0°F | Ht 65.0 in | Wt 126.4 lb

## 2013-01-06 DIAGNOSIS — J449 Chronic obstructive pulmonary disease, unspecified: Secondary | ICD-10-CM

## 2013-01-06 DIAGNOSIS — J9610 Chronic respiratory failure, unspecified whether with hypoxia or hypercapnia: Secondary | ICD-10-CM

## 2013-01-06 MED ORDER — PANTOPRAZOLE SODIUM 40 MG PO TBEC: 40.0000 mg | DELAYED_RELEASE_TABLET | Freq: Every day | ORAL | Status: DC

## 2013-01-06 MED ORDER — RANITIDINE HCL 150 MG PO CAPS: ORAL_CAPSULE | ORAL | Status: DC

## 2013-01-06 NOTE — Patient Instructions (Addendum)
Pantoprazole 40 mg Take 30-60 min before first meal of the day and ranitidine 150  2  at bedtime  Plan A = symbicort(2x2) and atrovent(2x4) perfectly regulalry - your technique is so good you should really start seeing the benefit.  Only use your albuterol (plan B proair puffer, plan C is nebulizer) as a rescue medication to be used if you can't catch your breath by resting or doing a relaxed purse lip breathing pattern. The less you use it, the better it will work when you need it.   GERD (REFLUX)  is an extremely common cause of respiratory symptoms, many times with no significant heartburn at all.    It can be treated with medication, but also with lifestyle changes including avoidance of late meals, excessive alcohol, smoking cessation, and avoid fatty foods, chocolate, peppermint, colas, red wine, and acidic juices such as orange juice.  NO MINT OR MENTHOL PRODUCTS SO NO COUGH DROPS  USE SUGARLESS CANDY INSTEAD (jolley ranchers or Stover's)  NO OIL BASED VITAMINS - use powdered substitutes.  Please see patient coordinator before you leave today  to schedule rehab referral  Please remember to go to the  x-ray department downstairs for your tests - we will call you with the results when they are available.    Please schedule a follow up visit in 1 month

## 2013-01-06 NOTE — Assessment & Plan Note (Addendum)
-   HFA 90% 10/10/2012     - PFT's 03/27/2012 FEV1 0.72 ( 27%) ratio 45 and 18 % better p B2, DLCO 48 corrects to 79%  DDX of  difficult airways managment all start with A and  include Adherence, Ace Inhibitors, Acid Reflux, Active Sinus Disease, Alpha 1 Antitripsin deficiency, Anxiety masquerading as Airways dz,  ABPA,  allergy(esp in young), Aspiration (esp in elderly), Adverse effects of DPI,  Active smokers, plus two Bs  = Bronchiectasis and Beta blocker use..and one C= CHF   Adherence is always the initial "prime suspect" and is a multilayered concern that requires a "trust but verify" approach in every patient - starting with knowing how to use medications, especially inhalers, correctly, keeping up with refills and understanding the fundamental difference between maintenance and prns vs those medications only taken for a very short course and then stopped and not refilled.   ? Acid reflux > max rx x 4 weeks reviewed  ? Anxiety > not clear what's driving this > good candidate for rehab plus continue prn xanax    Each maintenance medication was reviewed in detail including most importantly the difference between maintenance and as needed and under what circumstances the prns are to be used.  Please see instructions for details which were reviewed in writing and the patient given a copy.

## 2013-01-06 NOTE — Assessment & Plan Note (Signed)
-   03/27/2012   Walked RA x one lap @ 185 stopped due to  Fatigue and sob but no desat so changed to 2lpm hs only - 01/06/2013  Walked RA x 3 laps @ 185 ft each stopped due to end of study, no desat  REc 2lpm hs and prn only otherwise

## 2013-01-06 NOTE — Progress Notes (Signed)
Subjective:     Patient ID: Heidi [name], female   DOB: [birth year]  MRN: [id number]  HPI 25 yowf quit smoking 01/23/2012 at [facility] with [facility]ec[facility]d  With GOLD III COPD superimposed on chronic doe since around 2008 previously controlled with prn inhalers like combivent but since 2011 using these daily.   Admit date: 01/23/2012  D/C date: 01/25/2012   Primary Discharge Diagnoses:  1. NSTEMI secondary to possible Takotsubo cardiomyopathy  - EF 30-35% by echo this admission  - no evidence for obstructive CAD by cath 01/23/12  2. Acute respiratory failure prior to admission  3. Hypotension, limiting medication titration  4. Severe COPD   Admit date: 01/31/2012  Discharge date: 02/02/2012  Primary Care Physician: Lehman Prom, NP, NP  Discharge Diagnoses:  Active Problems:  TOBACCO ABUSE  COPD  Hx of MI < 8 weeks  Takotsubo cardiomyopathy    02/26/2012 1st pulmonary ov/ Everlina Gotts post hosp doe not back to baseline = Oct no 02 doe x [employer] but didn't need 02 and supposed to be on symbicort since discharge  but hand not used it on day of ov and doe x [employer]  and over using her neb and hfa's.  No cough or purulent sputum. rec Symbicort 160 Take 2 puffs first thing in am and then another 2 puffs about 12 hours later.  Only use your albuterol/atrovent  as a rescue medication to be used if you can't catch your breath by resting or doing a relaxed purse lip breathing pattern. The less you use it, the better it will work when you need it. -  Use nebulizer to back up the inhalers    03/27/2012 f/u ov/Milessa Hogan not smoking cc breathing better likes ventolin better proventil, has not been able to tolerate spiriva in past, no trouble with atrovent. No cough. Present doe x > slow walking flat maybe 200 ft off 02.  No change on 02, not much change p saba or daytime need. rec Atrovent (green tip) to take 2 puffs after symbicort and a total of 2 puffs 4x daily (2puffs bfast, lunch, supper and  bedtime) as a maintenance   Just use the 02 at bedtime - 2lpm should be enough  06/27/2012 f/u ov/Jameire Kouba thoroughly confused with last set of instructions mixing maint and prns, nebs and hfa. No change doe.  No unusual cough, purulent sputum or sinus/hb symptoms on present rx. rec Plan A Symbicort 160 Take 2 puffs first thing in am and then another 2 puffs about 12 hours later.              Atrovent either the puffer or neb is used 4 x daily (ok to use right after symbicort)  Plan B if you can't catch your breath after Plan A is being done correctly and consistently, then ok to use albuterol in hfa/puffer/inhaler Plan C if Plan B not working and still can't catch your breath, ok to use the nebulizer up to every 3 hours Plan D = Call the Doctor PLan E = ER   08/15/2012 Post Hospital Follow up  Patient returns for a post hospital followup. Patient was admitted  October 15 for acute respiratory failure secondary to  severe COPD, exacerbation. Patient was found in the parking lot in respiratory distress, requiring intubation in the emergency room She was treated with brief mechanical ventilation. Along with aggressive pulmonary hygiene, steroids and abx . CXR w/ chronic changes .  CXR were neg.  She did  have some hypotension and lactic acidosis and her Cozaar was held Today in the office. She is feeling better w/ decreased dyspnea and cough.  Still feels weak and tired.  Remains on Symbicort Twice daily  And Atrovent Four times a day   Currently on prednisone taper.  Has used her SABA /Albuterol x 1 yesterday.   Readmitted to [facility] Oct 31 x 2  Days with "panic attack"        09/05/2012 f/u ov/Jailah Willis cc breathing better since last admit but still feeling like she needs saba including one hour after symbicort when took xanax use of albuterol went way down, vs when ran out back up to 4 x daily saba now living in [place], only uses 02 at hs. rec Try Sue Lush one twice daily after symbicort as  part of plan A Alprazolam 0.5 mg one half to one three times as needed for nerves Plan B if you can't catch your breath after Plan A is being done correctly and consistently, then ok to use albuterol in hfa/puffer/inhaler Plan C if Plan B not working and still can't catch your breath, ok to use the nebulizer up to every 3 hours Plan D = Call the Doctor PLan E = ER    10/10/2012 f/u ov/Cuca Benassi cc ? Benefit from tudorza then stopped it due to cough which seemed to improve p stopped it but still hoarse and excessive urge to clear throat, using saba neb once a day (before goes out) and as has no hfa saba.  Only using 02 at hs. p stopped tudorza resumed qid atrovent hfa    Also reports intermittent severe HB rx with otc antacids helps. rec pantoprazole 40 mg Take 30-60 min before first meal of the day and pepcid 20 mg at bedtime Plan A = symbicort and atrovent perfectly regulalry - your technique is so good you should really start seeing the benefit. Only use your albuterol (plan B proair puffer, plan C is nebulizer)   GERD diet    01/06/2013 f/u ov/Maley Venezia cc not following any of the instructions from previous ov. Over using neb, using otc ranitidine in subtherapeutic doses, c/o sob room to room. Using atrovent tid   No obvious daytime variabilty or assoc chronic cough or cp or chest tightness, subjective wheeze overt sinus symptoms. No unusual exp hx   Sleeping ok without nocturnal  or early am exacerbation  of respiratory  c/o's or need for noct saba. Also denies any obvious fluctuation of symptoms with weather or environmental changes or other aggravating or alleviating factors except as outlined above   ROS  The following are not active complaints unless bolded sore throat, dysphagia, dental problems, itching, sneezing,  nasal congestion or excess/ purulent secretions, ear ache,   fever, chills, sweats, unintended wt loss, pleuritic or exertional cp, hemoptysis,  orthopnea pnd or leg swelling,  presyncope, palpitations, heartburn, abdominal pain, anorexia, nausea, vomiting, diarrhea  or change in bowel or urinary habits, change in stools or urine, dysuria,hematuria,  rash, arthralgias, visual complaints, headache, numbness weakness or ataxia or problems with walking or coordination,  change in mood/affect or memory.                          Objective:      Wt 114 02/26/2012 >  08/08/2012 >120 08/15/2012 > 119 09/05/2012 >  10/10/2012 123> 126 01/06/2013      HEENT mild turbinate edema.  Oropharynx no thrush or excess pnd  or cobblestoning.  No JVD or cervical adenopathy. Mild accessory muscle hypertrophy. Trachea midline, nl thryroid. Chest was hyperinflated by percussion with diminished breath sounds and moderate increased exp time without wheeze. Hoover sign positive at mid inspiration. Regular rate and rhythm without murmur gallop or rub or increase P2 or edema.  Abd: no hsm, nl excursion. Ext warm without cyanosis or clubbing.    CXR  01/06/2013 :  Chronic obstructive pulmonary disease. No acute cardiopulmonary abnormality seen.         Assessment:          Plan:

## 2013-01-07 ENCOUNTER — Telehealth: Payer: Self-pay | Admitting: Internal Medicine

## 2013-01-07 NOTE — Progress Notes (Signed)
Quick Note:  Spoke with patient informed her of results as listed below per MW. Patient verbalized understanding and nothing further needed at this time. ______

## 2013-01-07 NOTE — Telephone Encounter (Signed)
refaxed pul rehab to [facility] [place]

## 2013-01-07 NOTE — Telephone Encounter (Signed)
Spoke with patient informed her of results as listed below per MW. Patient verbalized understanding and nothing further needed at this time.  Notes Recorded by Nyoka Cowden, MD on 01/06/2013 at 8:22 PM Call pt: Reviewed cxr and no acute change so no change in recommendations made at [place]

## 2013-02-05 ENCOUNTER — Encounter: Payer: Self-pay | Admitting: Internal Medicine

## 2013-02-05 ENCOUNTER — Ambulatory Visit (INDEPENDENT_AMBULATORY_CARE_PROVIDER_SITE_OTHER): Payer: Medicaid Other | Admitting: Internal Medicine

## 2013-02-05 VITALS — BP 128/78 | HR 93 | Temp 97.5°F | Ht 66.0 in | Wt 129.0 lb

## 2013-02-05 DIAGNOSIS — J9610 Chronic respiratory failure, unspecified whether with hypoxia or hypercapnia: Secondary | ICD-10-CM

## 2013-02-05 DIAGNOSIS — J449 Chronic obstructive pulmonary disease, unspecified: Secondary | ICD-10-CM

## 2013-02-05 MED ORDER — DIAZEPAM 5 MG PO TABS: 5.0000 mg | ORAL_TABLET | Freq: Four times a day (QID) | ORAL | Status: DC | PRN

## 2013-02-05 NOTE — Assessment & Plan Note (Signed)
-   HFA 90% 10/10/2012     - PFT's 03/27/2012 FEV1 0.72 ( 27%) ratio 45 and 18 % better p B2, DLCO 48 corrects to 79%    - Referred to rehab 01/06/2013   Minimal improvement on max rx directed at airways and gerd > try rx anxiety with valium 5 mg up to qid    Each maintenance medication was reviewed in detail including most importantly the difference between maintenance and as needed and under what circumstances the prns are to be used.  Please see instructions for details which were reviewed in writing and the patient given a copy.

## 2013-02-05 NOTE — Assessment & Plan Note (Signed)
-   03/27/2012   Walked RA x one lap @ 185 stopped due to  Fatigue and sob but no desat so changed to 2lpm hs only - 01/06/2013  Walked RA x 3 laps @ 185 ft each stopped due to end of study, no desat  REc 3lpm at hs,  And 3lpm prn  with activity  Adequate control on present rx, reviewed

## 2013-02-05 NOTE — Progress Notes (Signed)
Subjective:     Patient ID: Heidi [name], female   DOB: 09-26-1956  MRN: [id number]  HPI 33 yowf quit smoking 01/23/2012 at [facility] with [facility]ecopd  With GOLD III COPD superimposed on chronic doe since around 2008 previously controlled with prn inhalers like combivent but since 2011 using these daily.   Admit date: 01/23/2012  D/C date: 01/25/2012   Primary Discharge Diagnoses:  1. NSTEMI secondary to possible Takotsubo cardiomyopathy  - EF 30-35% by echo this admission  - no evidence for obstructive CAD by cath 01/23/12  2. Acute respiratory failure prior to admission  3. Hypotension, limiting medication titration  4. Severe COPD   Admit date: 01/31/2012  Discharge date: 02/02/2012  Primary Care Physician: Lehman Prom, NP, NP  Discharge Diagnoses:  Active Problems:  TOBACCO ABUSE  COPD  Hx of MI < 8 weeks  Takotsubo cardiomyopathy    02/26/2012 1st pulmonary ov/ Wert post hosp doe not back to baseline = Oct no 02 doe x [employer] but didn't need 02 and supposed to be on symbicort since discharge  but hand not used it on day of ov and doe x [employer]  and over using her neb and hfa's.  No cough or purulent sputum. rec Symbicort 160 Take 2 puffs first thing in am and then another 2 puffs about 12 hours later.  Only use your albuterol/atrovent  as a rescue medication to be used if you can't catch your breath by resting or doing a relaxed purse lip breathing pattern. The less you use it, the better it will work when you need it. -  Use nebulizer to back up the inhalers    03/27/2012 f/u ov/Wert not smoking cc breathing better likes ventolin better proventil, has not been able to tolerate spiriva in past, no trouble with atrovent. No cough. Present doe x > slow walking flat maybe 200 ft off 02.  No change on 02, not much change p saba or daytime need. rec Atrovent (green tip) to take 2 puffs after symbicort and a total of 2 puffs 4x daily (2puffs bfast, lunch, supper and  bedtime) as a maintenance   Just use the 02 at bedtime - 2lpm should be enough  06/27/2012 f/u ov/Wert thoroughly confused with last set of instructions mixing maint and prns, nebs and hfa. No change doe.  No unusual cough, purulent sputum or sinus/hb symptoms on present rx. rec Plan A Symbicort 160 Take 2 puffs first thing in am and then another 2 puffs about 12 hours later.              Atrovent either the puffer or neb is used 4 x daily (ok to use right after symbicort)  Plan B if you can't catch your breath after Plan A is being done correctly and consistently, then ok to use albuterol in hfa/puffer/inhaler Plan C if Plan B not working and still can't catch your breath, ok to use the nebulizer up to every 3 hours Plan D = Call the Doctor PLan E = ER   08/15/2012 Post Hospital Follow up  Patient returns for a post hospital followup. Patient was admitted  October 15 for acute respiratory failure secondary to  severe COPD, exacerbation. Patient was found in the parking lot in respiratory distress, requiring intubation in the emergency room She was treated with brief mechanical ventilation. Along with aggressive pulmonary hygiene, steroids and abx . CXR w/ chronic changes .  CXR were neg.  She did  have some hypotension and lactic acidosis and her Cozaar was held Today in the office. She is feeling better w/ decreased dyspnea and cough.  Still feels weak and tired.  Remains on Symbicort Twice daily  And Atrovent Four times a day   Currently on prednisone taper.  Has used her SABA /Albuterol x 1 yesterday.   Readmitted to [facility] Oct 31 x 2  Days with "panic attack"        09/05/2012 f/u ov/Wert cc breathing better since last admit but still feeling like she needs saba including one hour after symbicort when took xanax use of albuterol went way down, vs when ran out back up to 4 x daily saba now living in [place], only uses 02 at hs. rec Try Sue Lush one twice daily after symbicort as  part of plan A Alprazolam 0.5 mg one half to one three times as needed for nerves Plan B if you can't catch your breath after Plan A is being done correctly and consistently, then ok to use albuterol in hfa/puffer/inhaler Plan C if Plan B not working and still can't catch your breath, ok to use the nebulizer up to every 3 hours Plan D = Call the Doctor PLan E = ER    10/10/2012 f/u ov/Wert cc ? Benefit from tudorza then stopped it due to cough which seemed to improve p stopped it but still hoarse and excessive urge to clear throat, using saba neb once a day (before goes out) and as has no hfa saba.  Only using 02 at hs. p stopped tudorza resumed qid atrovent hfa   Also reports intermittent severe HB rx with otc antacids helps. rec pantoprazole 40 mg Take 30-60 min before first meal of the day and pepcid 20 mg at bedtime Plan A = symbicort and atrovent perfectly regulalry - your technique is so good you should really start seeing the benefit. Only use your albuterol (plan B proair puffer, plan C is nebulizer)   GERD diet    01/06/2013 f/u ov/Wert cc not following any of the instructions from previous ov. Over using neb, using otc ranitidine in subtherapeutic doses, c/o sob room to room. Using atrovent tid  rec Pantoprazole 40 mg Take 30-60 min before first meal of the day and ranitidine 150  2  at bedtime Plan A = symbicort(2x2) and atrovent(2x4) perfectly regulalry - your technique is so good you should really start seeing the benefit. Only use your albuterol (plan B proair puffer, plan C is nebulizer) as a rescue medication   GERD diet   02/05/2013 f/u ov/Wert f/u GOLD IV COPD Chief Complaint  Patient presents with  . Follow-up    Pt states that her breathing is about the same since last visit, she c/o wheezing off and on for the past wk.She has also noticed waking up the past 3-4 nights feeling SOB.    following rx for symbiocrt / atrovent consistenly and less need for saba hfa and neb x  2 since last ov and using 02 3lpm with exertion but not consitently  No obvious daytime variabilty or assoc chronic cough or cp or chest tightness, subjective wheeze overt sinus symptoms. No unusual exp hx   Sleeping ok without nocturnal  or early am exacerbation  of respiratory  c/o's or need for noct saba. Also denies any obvious fluctuation of symptoms with weather or environmental changes or other aggravating or alleviating factors except as outlined above   ROS  The following are not active  complaints unless bolded sore throat, dysphagia, dental problems, itching, sneezing,  nasal congestion or excess/ purulent secretions, ear ache,   fever, chills, sweats, unintended wt loss, pleuritic or exertional cp, hemoptysis,  orthopnea pnd or leg swelling, presyncope, palpitations, heartburn, abdominal pain, anorexia, nausea, vomiting, diarrhea  or change in bowel or urinary habits, change in stools or urine, dysuria,hematuria,  rash, arthralgias, visual complaints, headache, numbness weakness or ataxia or problems with walking or coordination,  change in mood/affect or memory.                          Objective:      Wt 114 02/26/2012 >  08/08/2012 >120 08/15/2012 > 119 09/05/2012 >  10/10/2012 123> 126 01/06/2013  > 02/05/2013 129  Anxious amb wf nad at rest RA    HEENT mild turbinate edema.  Oropharynx no thrush or excess pnd or cobblestoning.  No JVD or cervical adenopathy. Mild accessory muscle hypertrophy. Trachea midline, nl thryroid. Chest was hyperinflated by percussion with diminished breath sounds and moderate increased exp time without wheeze. Hoover sign positive at mid inspiration. Regular rate and rhythm without murmur gallop or rub or increase P2 or edema.  Abd: no hsm, nl excursion. Ext warm without cyanosis or clubbing.    CXR  01/06/2013 :  Chronic obstructive pulmonary disease. No acute cardiopulmonary abnormality seen.         Assessment:          Plan:

## 2013-02-05 NOTE — Patient Instructions (Addendum)
Continue 02 3lpm at bedtime   Stop xanax and use valium 5 mg every 6 hours as needed for breathless  Please schedule a follow up office visit in 4 weeks, sooner if needed

## 2013-02-08 ENCOUNTER — Emergency Department: Payer: Self-pay | Admitting: Emergency Medicine

## 2013-02-08 LAB — TROPONIN I: Troponin-I: <0.02 ng/mL

## 2013-02-08 LAB — CBC
HCT: 39.2 % (ref 35.0–47.0)
HGB: 13.1 g/dL (ref 12.0–16.0)
MCHC: 33.3 g/dL (ref 32.0–36.0)
Platelet: 310 10*3/uL (ref 150–440)
RBC: 4.06 10*6/uL (ref 3.80–5.20)
RDW: 13.1 % (ref 11.5–14.5)
WBC: 5.6 10*3/uL (ref 3.6–11.0)

## 2013-02-08 LAB — BASIC METABOLIC PANEL
Anion Gap: 3 — ABNORMAL LOW (ref 7–16)
BUN: 11 mg/dL (ref 7–18)
Calcium, Total: 9.2 mg/dL (ref 8.5–10.1)
Co2: 31 mmol/L (ref 21–32)
Creatinine: 0.74 mg/dL (ref 0.60–1.30)
EGFR (African American): >60
EGFR (Non-African Amer.): >60
Glucose: 106 mg/dL — ABNORMAL HIGH (ref 65–99)
Osmolality: 275 (ref 275–301)
Potassium: 3.6 mmol/L (ref 3.5–5.1)
Sodium: 138 mmol/L (ref 136–145)

## 2013-02-08 LAB — CK TOTAL AND CKMB (NOT AT ARMC)
CK, Total: 93 U/L (ref 21–215)
CK-MB: 0.8 ng/mL (ref 0.5–3.6)

## 2013-02-19 ENCOUNTER — Telehealth: Payer: Self-pay | Admitting: Internal Medicine

## 2013-02-19 DIAGNOSIS — F329 Major depressive disorder, single episode, unspecified: Secondary | ICD-10-CM

## 2013-02-19 DIAGNOSIS — F3289 Other specified depressive episodes: Secondary | ICD-10-CM

## 2013-02-19 NOTE — Telephone Encounter (Signed)
I called # and office is closed. They will reopen at 8AM. [facility]

## 2013-02-20 NOTE — Telephone Encounter (Signed)
Ginger called back in regards to pts meds--states that Dr Sherene Sires has refilled pts Diazepam and wants this cancelled. Dr Lahoma Rocker that treats patients anxiety prefers to be the only physician to follow the pt for anxiety issues and that if patient asks for refills from our office we are not to fill them.   Will send this message to Dr Sherene Sires as Lorain Childes of the request from Dr Mamie Nick office and also make him aware of the cancellation of this refill. Thanks.

## 2013-02-20 NOTE — Telephone Encounter (Signed)
ATC Ginger, spoke with Terri she states Ginger is in room w patient at this time. LM w Terri to have Ginger return call

## 2013-02-20 NOTE — Telephone Encounter (Signed)
Let them know this is absolutely fine with me and I will place the info in our problem list/ overview box where all who use epic will see it.

## 2013-02-25 ENCOUNTER — Encounter: Payer: Self-pay | Admitting: Internal Medicine

## 2013-02-27 ENCOUNTER — Encounter: Payer: Self-pay | Admitting: Internal Medicine

## 2013-03-02 ENCOUNTER — Emergency Department: Payer: Self-pay | Admitting: Emergency Medicine

## 2013-03-02 LAB — CK TOTAL AND CKMB (NOT AT ARMC)
CK, Total: 140 U/L (ref 21–215)
CK, Total: 138 U/L (ref 21–215)

## 2013-03-02 LAB — COMPREHENSIVE METABOLIC PANEL
Albumin: 3.4 g/dL (ref 3.4–5.0)
Alkaline Phosphatase: 112 U/L (ref 50–136)
Calcium, Total: 9.2 mg/dL (ref 8.5–10.1)
Co2: 30 mmol/L (ref 21–32)
Creatinine: 0.81 mg/dL (ref 0.60–1.30)
EGFR (African American): >60
EGFR (Non-African Amer.): >60
Potassium: 3.6 mmol/L (ref 3.5–5.1)
SGOT(AST): 27 U/L (ref 15–37)
SGPT (ALT): 27 U/L (ref 12–78)
Total Protein: 6.9 g/dL (ref 6.4–8.2)

## 2013-03-02 LAB — URINALYSIS, COMPLETE
Bacteria: NONE SEEN
Bilirubin,UR: NEGATIVE
Glucose,UR: NEGATIVE mg/dL (ref 0–75)
Ketone: NEGATIVE
Nitrite: NEGATIVE
Ph: 6 (ref 4.5–8.0)
Protein: NEGATIVE
Specific Gravity: 1.02 (ref 1.003–1.030)
Squamous Epithelial: <1

## 2013-03-02 LAB — CBC
HCT: 35.8 % (ref 35.0–47.0)
MCH: 32.8 pg (ref 26.0–34.0)
MCHC: 34.3 g/dL (ref 32.0–36.0)
MCV: 96 fL (ref 80–100)
RBC: 3.74 10*6/uL — ABNORMAL LOW (ref 3.80–5.20)
WBC: 8.5 10*3/uL (ref 3.6–11.0)

## 2013-03-02 LAB — TROPONIN I
Troponin-I: <0.02 ng/mL
Troponin-I: <0.02 ng/mL

## 2013-03-03 ENCOUNTER — Other Ambulatory Visit: Payer: Self-pay | Admitting: Internal Medicine

## 2013-03-07 ENCOUNTER — Encounter: Payer: Self-pay | Admitting: Internal Medicine

## 2013-03-07 ENCOUNTER — Ambulatory Visit (INDEPENDENT_AMBULATORY_CARE_PROVIDER_SITE_OTHER): Payer: Medicaid Other | Admitting: Internal Medicine

## 2013-03-07 VITALS — BP 122/76 | HR 68 | Temp 97.6°F | Ht 65.0 in | Wt 121.0 lb

## 2013-03-07 DIAGNOSIS — J9610 Chronic respiratory failure, unspecified whether with hypoxia or hypercapnia: Secondary | ICD-10-CM

## 2013-03-07 DIAGNOSIS — J449 Chronic obstructive pulmonary disease, unspecified: Secondary | ICD-10-CM

## 2013-03-07 MED ORDER — BUDESONIDE-FORMOTEROL FUMARATE 160-4.5 MCG/ACT IN AERO: 2.0000 | INHALATION_SPRAY | Freq: Two times a day (BID) | RESPIRATORY_TRACT | Status: DC

## 2013-03-07 NOTE — Patient Instructions (Addendum)
Think of your respiratory medications as multiple steps you can take to control your symptoms and avoid having to go to the ER   Plan A is your maintenance daily no matter what meds: symbicort and atrovent Plan B only use after you've used your maintenance (Plan A)  medication, and only if you can't catch your breath: proaire up to 2 puffs 4hours as needed Plan C only use after you've used plan A and B and still can't catch your breath: nebulizer every 4 hours  Plan D(for Doctor):  If you've used A thru C and not doing a lot better or still needing C more than a once a day,  D = call the doctor for evaluation asap Plan E (for ER):  If still not able to catch your breath, even after using your nebulizer up to every 4 hours, go to ER   02 3lpm sleeping, 3lpm with exertion (unless being monitored  And 02 sats are staying above 90%)  Please schedule a follow up visit in 3 months but call sooner if needed

## 2013-03-07 NOTE — Progress Notes (Signed)
Subjective:     Patient ID: Heidi [name], female   DOB: 07-Nov-1955  MRN: [id number]  HPI 44 yowf quit smoking 01/23/2012 at admit to [facility] with aecopd  With GOLD III COPD superimposed on chronic doe since around 2008 previously controlled with prn inhalers like combivent but since 2011 using these daily.   Admit date: 01/23/2012  D/C date: 01/25/2012   Primary Discharge Diagnoses:  1. NSTEMI secondary to possible Takotsubo cardiomyopathy  - EF 30-35% by echo this admission  - no evidence for obstructive CAD by cath 01/23/12  2. Acute respiratory failure prior to admission  3. Hypotension, limiting medication titration  4. Severe COPD   Admit date: 01/31/2012  Discharge date: 02/02/2012  Primary Care Physician: Lehman Prom, NP, NP  Discharge Diagnoses:  Active Problems:  TOBACCO ABUSE  COPD  Hx of MI < 8 weeks  Takotsubo cardiomyopathy    02/26/2012 1st pulmonary ov/ Deandria Klute post hosp doe not back to baseline = Oct no 02 doe x [employer] but didn't need 02 and supposed to be on symbicort since discharge  but hand not used it on day of ov and doe x [employer]  and over using her neb and hfa's.  No cough or purulent sputum. rec Symbicort 160 Take 2 puffs first thing in am and then another 2 puffs about 12 hours later.  Only use your albuterol/atrovent  as a rescue medication to be used if you can't catch your breath by resting or doing a relaxed purse lip breathing pattern. The less you use it, the better it will work when you need it. -  Use nebulizer to back up the inhalers    03/27/2012 f/u ov/Kendrea Cerritos not smoking cc breathing better likes ventolin better proventil, has not been able to tolerate spiriva in past, no trouble with atrovent. No cough. Present doe x > slow walking flat maybe 200 ft off 02.  No change on 02, not much change p saba or daytime need. rec Atrovent (green tip) to take 2 puffs after symbicort and a total of 2 puffs 4x daily (2puffs bfast, lunch, supper and  bedtime) as a maintenance   Just use the 02 at bedtime - 2lpm should be enough  06/27/2012 f/u ov/Rocsi Hazelbaker thoroughly confused with last set of instructions mixing maint and prns, nebs and hfa. No change doe.  No unusual cough, purulent sputum or sinus/hb symptoms on present rx. rec Plan A Symbicort 160 Take 2 puffs first thing in am and then another 2 puffs about 12 hours later.              Atrovent either the puffer or neb is used 4 x daily (ok to use right after symbicort)  Plan B if you can't catch your breath after Plan A is being done correctly and consistently, then ok to use albuterol in hfa/puffer/inhaler Plan C if Plan B not working and still can't catch your breath, ok to use the nebulizer up to every 3 hours Plan D = Call the Doctor PLan E = ER   08/15/2012 Post Hospital Follow up  Patient returns for a post hospital followup. Patient was admitted  October 15 for acute respiratory failure secondary to  severe COPD, exacerbation. Patient was found in the parking lot in respiratory distress, requiring intubation in the emergency room She was treated with brief mechanical ventilation. Along with aggressive pulmonary hygiene, steroids and abx . CXR w/ chronic changes .  CXR were neg.  She did  have some hypotension and lactic acidosis and her Cozaar was held Today in the office. She is feeling better w/ decreased dyspnea and cough.  Still feels weak and tired.  Remains on Symbicort Twice daily  And Atrovent Four times a day   Currently on prednisone taper.  Has used her SABA /Albuterol x 1 yesterday.   Readmitted to [facility] Oct 31 x 2  Days with "panic attack"        09/05/2012 f/u ov/Nadim Malia cc breathing better since last admit but still feeling like she needs saba including one hour after symbicort when took xanax use of albuterol went way down, vs when ran out back up to 4 x daily saba now living in [place], only uses 02 at hs. rec Try Sue Lush one twice daily after symbicort as  part of plan A Alprazolam 0.5 mg one half to one three times as needed for nerves Plan B if you can't catch your breath after Plan A is being done correctly and consistently, then ok to use albuterol in hfa/puffer/inhaler Plan C if Plan B not working and still can't catch your breath, ok to use the nebulizer up to every 3 hours Plan D = Call the Doctor PLan E = ER    10/10/2012 f/u ov/Donaciano Range cc ? Benefit from tudorza then stopped it due to cough which seemed to improve p stopped it but still hoarse and excessive urge to clear throat, using saba neb once a day (before goes out) and as has no hfa saba.  Only using 02 at hs. p stopped tudorza resumed qid atrovent hfa   Also reports intermittent severe HB rx with otc antacids helps. rec pantoprazole 40 mg Take 30-60 min before first meal of the day and pepcid 20 mg at bedtime Plan A = symbicort and atrovent perfectly regulalry - your technique is so good you should really start seeing the benefit. Only use your albuterol (plan B proair puffer, plan C is nebulizer)   GERD diet    01/06/2013 f/u ov/Jacquette Canales cc not following any of the instructions from previous ov. Over using neb, using otc ranitidine in subtherapeutic doses, c/o sob room to room. Using atrovent tid  rec Pantoprazole 40 mg Take 30-60 min before first meal of the day and ranitidine 150  2  at bedtime Plan A = symbicort(2x2) and atrovent(2x4) perfectly regulalry - your technique is so good you should really start seeing the benefit. Only use your albuterol (plan B proair puffer, plan C is nebulizer) as a rescue medication   GERD diet   02/05/2013 f/u ov/Neddie Steedman f/u GOLD IV COPD Chief Complaint  Patient presents with  . Follow-up    Pt states that her breathing is about the same since last visit, she c/o wheezing off and on for the past wk.She has also noticed waking up the past 3-4 nights feeling SOB.    following rx for symbiocrt / atrovent consistenly and less need for saba hfa and neb x  2 since last ov and using 02 3lpm with exertion but not consitently rec Continue 02 3lpm at bedtime  Stop xanax and use valium 5 mg every 6 hours as needed for breathless  03/07/2013 f/u ov/Zackarey Holleman re copd Chief Complaint  Patient presents with  . Follow-up    Breathing is some better, not waking up feeling SOB since the last visit. She started pulmonary rehab this wk.    valium 5 mg at hs only and saba hfa once daily and no neb  at all at this point  No obvious daytime variabilty or assoc chronic cough or cp or chest tightness, subjective wheeze overt sinus symptoms. No unusual exp hx   Sleeping ok without nocturnal  or early am exacerbation  of respiratory  c/o's or need for noct saba. Also denies any obvious fluctuation of symptoms with weather or environmental changes or other aggravating or alleviating factors except as outlined above   ROS  The following are not active complaints unless bolded sore throat, dysphagia, dental problems, itching, sneezing,  nasal congestion or excess/ purulent secretions, ear ache,   fever, chills, sweats, unintended wt loss, pleuritic or exertional cp, hemoptysis,  orthopnea pnd or leg swelling, presyncope, palpitations, heartburn, abdominal pain, anorexia, nausea, vomiting, diarrhea  or change in bowel or urinary habits, change in stools or urine, dysuria,hematuria,  rash, arthralgias, visual complaints, headache, numbness weakness or ataxia or problems with walking or coordination,  change in mood/affect or memory.                          Objective:      Wt 114 02/26/2012 >  08/08/2012 >120 08/15/2012 > 119 09/05/2012 >  10/10/2012 123> 126 01/06/2013  > 02/05/2013 129 > 03/07/2013  97%   Anxious amb wf nad at rest RA    HEENT mild turbinate edema.  Oropharynx no thrush or excess pnd or cobblestoning.  No JVD or cervical adenopathy. Mild accessory muscle hypertrophy. Trachea midline, nl thryroid. Chest was hyperinflated by percussion with diminished  breath sounds and moderate increased exp time without wheeze. Hoover sign positive at mid inspiration. Regular rate and rhythm without murmur gallop or rub or increase P2 or edema.  Abd: no hsm, nl excursion. Ext warm without cyanosis or clubbing.    CXR  01/06/2013 :  Chronic obstructive pulmonary disease. No acute cardiopulmonary abnormality seen.         Assessment:          Plan:

## 2013-03-08 NOTE — Assessment & Plan Note (Signed)
-   HFA 90% 10/10/2012     - PFT's 03/27/2012 FEV1 0.72 ( 27%) ratio 45 and 18 % better p B2 (to 32% predicted) , DLCO 48 corrects to 79%    - Referred to rehab 01/06/2013   Adequate control on present rx, reviewed   I had an extended discussion with the patient today lasting 15 to 20 minutes of a 25 minute visit on the following issues:     Each maintenance medication was reviewed in detail including most importantly the difference between maintenance and as needed and under what circumstances the prns are to be used.  Please see instructions for details which were reviewed in writing and the patient given a copy.

## 2013-03-08 NOTE — Assessment & Plan Note (Signed)
-   03/27/2012   Walked RA x one lap @ 185 stopped due to  Fatigue and sob but no desat so changed to 2lpm hs only - 01/06/2013  Walked RA x 3 laps @ 185 ft each stopped due to end of study, no desat  REc 3lpm at hs,  And 3lpm prn  with activity unless 02 sats being monitored, tittrated to sat > 90%  See instructions for specific recommendations which were reviewed directly with the patient who was given a copy with highlighter outlining the key components.

## 2013-03-26 ENCOUNTER — Telehealth: Payer: Self-pay | Admitting: Internal Medicine

## 2013-03-26 MED ORDER — ALBUTEROL SULFATE HFA 108 (90 BASE) MCG/ACT IN AERS: 2.0000 | INHALATION_SPRAY | Freq: Four times a day (QID) | RESPIRATORY_TRACT | Status: DC | PRN

## 2013-03-26 NOTE — Telephone Encounter (Signed)
Spoke with pt requesting rx for proair and  valium 5mg  Per telephone note below from 02/20/13  ginger called back in regards to pts meds--states that Dr Sherene Sires has refilled pts Diazepam and wants this cancelled. Dr Lahoma Rocker that treats patients anxiety prefers to be the only physician to follow the pt for anxiety issues and that if patient asks for refills from our office we are not to fill them.  Will send this message to Dr Sherene Sires as Lorain Childes of the request from Dr Mamie Nick office and also make him aware of the cancellation of this refill. Thanks. \   Pt was made aware to Call Dr Mamie Nick office and address that the xanax isnt working. Will call in rx for her proair. Pt verbally understood nothing further needed. Will send to Dr Sherene Sires as Lorain Childes

## 2013-03-30 ENCOUNTER — Encounter: Payer: Self-pay | Admitting: Internal Medicine

## 2013-04-09 ENCOUNTER — Encounter (HOSPITAL_COMMUNITY): Payer: Self-pay | Admitting: Emergency Medicine

## 2013-04-09 ENCOUNTER — Emergency Department (HOSPITAL_COMMUNITY): Payer: Medicaid Other

## 2013-04-09 ENCOUNTER — Observation Stay (HOSPITAL_COMMUNITY)
Admission: EM | Admit: 2013-04-09 | Discharge: 2013-04-10 | Disposition: A | Discharge status: Home / Self Care | Payer: Medicaid Other | Attending: Internal Medicine | Admitting: Internal Medicine

## 2013-04-09 DIAGNOSIS — IMO0002 Reserved for concepts with insufficient information to code with codable children: Secondary | ICD-10-CM

## 2013-04-09 DIAGNOSIS — J9600 Acute respiratory failure, unspecified whether with hypoxia or hypercapnia: Secondary | ICD-10-CM

## 2013-04-09 DIAGNOSIS — G9340 Encephalopathy, unspecified: Secondary | ICD-10-CM

## 2013-04-09 DIAGNOSIS — R3129 Other microscopic hematuria: Secondary | ICD-10-CM

## 2013-04-09 DIAGNOSIS — Z9079 Acquired absence of other genital organ(s): Secondary | ICD-10-CM

## 2013-04-09 DIAGNOSIS — Z79899 Other long term (current) drug therapy: Secondary | ICD-10-CM | POA: Insufficient documentation

## 2013-04-09 DIAGNOSIS — E538 Deficiency of other specified B group vitamins: Secondary | ICD-10-CM

## 2013-04-09 DIAGNOSIS — Z87891 Personal history of nicotine dependence: Secondary | ICD-10-CM

## 2013-04-09 DIAGNOSIS — M899 Disorder of bone, unspecified: Secondary | ICD-10-CM

## 2013-04-09 DIAGNOSIS — R209 Unspecified disturbances of skin sensation: Secondary | ICD-10-CM

## 2013-04-09 DIAGNOSIS — J069 Acute upper respiratory infection, unspecified: Secondary | ICD-10-CM

## 2013-04-09 DIAGNOSIS — J4489 Other specified chronic obstructive pulmonary disease: Secondary | ICD-10-CM

## 2013-04-09 DIAGNOSIS — R87811 Vaginal high risk human papillomavirus (HPV) DNA test positive: Secondary | ICD-10-CM

## 2013-04-09 DIAGNOSIS — F411 Generalized anxiety disorder: Principal | ICD-10-CM | POA: Insufficient documentation

## 2013-04-09 DIAGNOSIS — R490 Dysphonia: Secondary | ICD-10-CM

## 2013-04-09 DIAGNOSIS — L989 Disorder of the skin and subcutaneous tissue, unspecified: Secondary | ICD-10-CM

## 2013-04-09 DIAGNOSIS — R87610 Atypical squamous cells of undetermined significance on cytologic smear of cervix (ASC-US): Secondary | ICD-10-CM

## 2013-04-09 DIAGNOSIS — F419 Anxiety disorder, unspecified: Secondary | ICD-10-CM | POA: Diagnosis present

## 2013-04-09 DIAGNOSIS — F3289 Other specified depressive episodes: Secondary | ICD-10-CM

## 2013-04-09 DIAGNOSIS — F329 Major depressive disorder, single episode, unspecified: Secondary | ICD-10-CM

## 2013-04-09 DIAGNOSIS — I252 Old myocardial infarction: Secondary | ICD-10-CM | POA: Insufficient documentation

## 2013-04-09 DIAGNOSIS — J441 Chronic obstructive pulmonary disease with (acute) exacerbation: Secondary | ICD-10-CM

## 2013-04-09 DIAGNOSIS — T148XXA Other injury of unspecified body region, initial encounter: Secondary | ICD-10-CM

## 2013-04-09 DIAGNOSIS — J9610 Chronic respiratory failure, unspecified whether with hypoxia or hypercapnia: Secondary | ICD-10-CM

## 2013-04-09 DIAGNOSIS — I5181 Takotsubo syndrome: Secondary | ICD-10-CM | POA: Diagnosis present

## 2013-04-09 DIAGNOSIS — R079 Chest pain, unspecified: Secondary | ICD-10-CM

## 2013-04-09 DIAGNOSIS — E872 Acidosis, unspecified: Secondary | ICD-10-CM

## 2013-04-09 DIAGNOSIS — J449 Chronic obstructive pulmonary disease, unspecified: Secondary | ICD-10-CM

## 2013-04-09 LAB — DIFFERENTIAL
Lymphocytes Relative: 16 % (ref 12–46)
Monocytes Absolute: 0.7 10*3/uL (ref 0.1–1.0)
Monocytes Relative: 7 % (ref 3–12)
Neutro Abs: 7.5 10*3/uL (ref 1.7–7.7)

## 2013-04-09 LAB — CBC
MCH: 32.7 pg (ref 26.0–34.0)
MCHC: 34.3 g/dL (ref 30.0–36.0)
MCV: 95.3 fL (ref 78.0–100.0)
Platelets: 319 10*3/uL (ref 150–400)
RBC: 4.07 MIL/uL (ref 3.87–5.11)

## 2013-04-09 LAB — BASIC METABOLIC PANEL
CO2: 27 mEq/L (ref 19–32)
Calcium: 9.9 mg/dL (ref 8.4–10.5)
Creatinine, Ser: 0.72 mg/dL (ref 0.50–1.10)
GFR calc non Af Amer: >90 mL/min (ref >90)
Glucose, Bld: 138 mg/dL — ABNORMAL HIGH (ref 70–99)

## 2013-04-09 LAB — PRO B NATRIURETIC PEPTIDE: Pro B Natriuretic peptide (BNP): 71.7 pg/mL (ref 0–125)

## 2013-04-09 MED ORDER — ASPIRIN 81 MG PO CHEW
324.0000 mg | CHEWABLE_TABLET | Freq: Once | ORAL | Status: AC
  Administered 2013-04-09: 324 mg via ORAL
  Filled 2013-04-09: qty 4

## 2013-04-09 MED ORDER — ASPIRIN 325 MG PO TABS
325.0000 mg | ORAL_TABLET | ORAL | Status: DC
  Filled 2013-04-09: qty 1

## 2013-04-09 NOTE — ED Notes (Signed)
Pt states that she took a low dose aspirin at 1630 today.

## 2013-04-09 NOTE — ED Notes (Addendum)
Pt states she has Level 3 COPD and is in pulmonary rehab. Pt is on 3L O2 at home. Pt states she began having dizziness and SOB more severe over the last few days. Tonight she began having increased SOB and chest pain. Denies n/v but has had weakness, diaphoresis, lightheadness, & HA. Pt talking in full sentences.

## 2013-04-09 NOTE — ED Provider Notes (Signed)
History     CSN: [id number]  Arrival date & time 04/09/13  2233   First MD Initiated Contact with Patient 04/09/13 2311      Chief Complaint  Patient presents with  . Shortness of Breath  . Chest Pain    (Consider location/radiation/quality/duration/timing/severity/associated sxs/prior treatment) HPI  Heidi [name] is a 57 y.o. female with past medical history significant for COPD (on 3 L home O2 at night and when necessary in the day) and NSTEMI secondary to Takotsubo myocarditis (clean cath) March of 2013 complaining of pressure-like sensation to the left chest, nonradiating, associated with shortness of breath. Patient denies cough, fever, chills, nausea, vomiting, abdominal pain, change in bowel or bladder habits. Patient recently quit smoking in October of 2013. She saw her primary care physician today who gave her new medication for anxiety disorder.  Past Medical History  Diagnosis Date  . COPD (chronic obstructive pulmonary disease)   . NSTEMI (non-ST elevated myocardial infarction)     12/2011 with normal coronaries possibly secondary to Takotsubo (EF 30-35% by echo), occurred following two episodes of acute respiratory distress  . Hypotension     limiting med titration  . Takotsubo syndrome 4/13    Past Surgical History  Procedure Laterality Date  . Abdominal hysterectomy    . Vagina rectal repair      after childbirth  . Cardiac catheterization      Family History  Problem Relation Age of Onset  . Heart attack Mother     deceased from massive MI at age 49  . Liver disease Father     fatty liver; living, age 19  . Stroke Brother     at age 72, living   . Hypertension Brother     living, age 97    History  Substance Use Topics  . Smoking status: Former Smoker -- 30 years    Types: Cigarettes    Quit date: 01/21/2012  . Smokeless tobacco: Never Used     Comment: smoked 1 ppd X 30 years, has recently cut down to 3 packs per week; has quit smoking  entirely X 2 days  . Alcohol Use: Yes     Comment: 1-2 beers per week    OB History   Grav Para Term Preterm Abortions TAB SAB Ect Mult Living                  Review of Systems  Constitutional: Negative for fever.  Respiratory: Positive for shortness of breath.   Cardiovascular: Positive for chest pain.  Gastrointestinal: Negative for nausea, vomiting, abdominal pain and diarrhea.  All other systems reviewed and are negative.    Allergies  Codeine  Home Medications   Current Outpatient Rx  Name  Route  Sig  Dispense  Refill  . albuterol (PROAIR HFA) 108 (90 BASE) MCG/ACT inhaler   Inhalation   Inhale 2 puffs into the lungs every 6 (six) hours as needed for wheezing.   1 Inhaler   2   . EXPIRED: albuterol (PROVENTIL) (2.5 MG/3ML) 0.083% nebulizer solution   Nebulization   Take 3 mLs (2.5 mg total) by nebulization every 4 (four) hours as needed for wheezing or shortness of breath.   360 mL   3   . budesonide-formoterol (SYMBICORT) 160-4.5 MCG/ACT inhaler   Inhalation   Inhale 2 puffs into the lungs 2 (two) times daily.   1 Inhaler   11   . diazepam (VALIUM) 5 MG tablet   Oral  Take 1 tablet (5 mg total) by mouth every 6 (six) hours as needed for anxiety.   120 tablet   2   . ipratropium (ATROVENT HFA) 17 MCG/ACT inhaler   Inhalation   Inhale 2 puffs into the lungs 4 (four) times daily.   1 Inhaler   12   . OXYGEN-HELIUM IN   Inhalation   Inhale into the lungs. 3 lpm at night and as needed during the day         . pantoprazole (PROTONIX) 40 MG tablet   Oral   Take 1 tablet (40 mg total) by mouth daily. Take 30-60 min before first meal of the day   30 tablet   2   . ranitidine (ZANTAC) 150 MG capsule      2 at bedtime   150 capsule      . sodium chloride (OCEAN) 0.65 % SOLN nasal spray   Nasal   Place 1 spray into the nose as needed for congestion.   1 Bottle        Temp(Src) 98.4 F (36.9 C) (Oral)  Resp 21  Ht 5\' 6"  (1.676 m)  Wt  131 lb (59.421 kg)  BMI 21.15 kg/m2  SpO2 99%  Physical Exam  Nursing note and vitals reviewed. Constitutional: She is oriented to person, place, and time. She appears well-developed and well-nourished. No distress.  HENT:  Head: Normocephalic.  Mouth/Throat: Oropharynx is clear and moist.  Eyes: Conjunctivae and EOM are normal. Pupils are equal, round, and reactive to light.  Neck: No JVD present.  Cardiovascular: Normal rate, regular rhythm and intact distal pulses.   Pulmonary/Chest: Effort normal and breath sounds normal. No stridor. No respiratory distress. She has no wheezes. She has no rales. She exhibits no tenderness.  Abdominal: Soft. Bowel sounds are normal. She exhibits no distension. There is no tenderness. There is no rebound and no guarding.  Musculoskeletal: Normal range of motion.  No calf asymmetry, superficial collaterals, palpable cords, edema, Homans sign negative bilaterally.    Neurological: She is alert and oriented to person, place, and time.  Psychiatric:  Agitated    ED Course  Procedures (including critical care time)  Labs Reviewed  BASIC METABOLIC PANEL - Abnormal; Notable for the following:    Glucose, Bld 138 (*)    All other components within normal limits  HEPATIC FUNCTION PANEL - Abnormal; Notable for the following:    Alkaline Phosphatase 143 (*)    Total Bilirubin 0.2 (*)    All other components within normal limits  CBC  PRO B NATRIURETIC PEPTIDE  DIFFERENTIAL  CBC WITH DIFFERENTIAL  POCT I-STAT TROPONIN I   Dg Chest Port 1 View  04/09/2013   *RADIOLOGY REPORT*  Clinical Data: Shortness of breath and cough.  PORTABLE CHEST - 1 VIEW  Comparison: 01/06/2013  Findings: Stable COPD.  No infiltrate, edema, pneumothorax or pleural fluid is identified.  Heart size and mediastinal contours remain normal.  IMPRESSION: Stable COPD.  No acute findings.   Original Report Authenticated By: Irish Lack, M.D.    Date: 04/10/2013  Rate: 91   Rhythm: normal sinus rhythm  QRS Axis: normal  Intervals: normal  ST/T Wave abnormalities: nonspecific T wave changes aVL t wave inversions  Conduction Disutrbances:none  Narrative Interpretation:   Old EKG Reviewed: unchanged  2:01 AM lesion seen and examined at the bedside, she seems 6 much calmer. She rates her pressure a 4/10 worse before it was 6/10.   1. Chest pain  MDM   Filed Vitals:   04/09/13 2241 04/10/13 0030 04/10/13 0032  BP:  139/80   Pulse:  81   Temp: 98.4 F (36.9 C)    TempSrc: Oral    Resp: 21 18   Height: 5\' 6"  (1.676 m)    Weight: 131 lb (59.421 kg)    SpO2: 99% 100% 100%     Heidi [name] is a 57 y.o. female complaining of chest pressure and shortness of breath. No wheezing on lung exam. Chest x-ray is clear. EKG is nonischemic and troponin is negative. Because of the patient's prior MI I think it is reasonable to bring her in for admission, serial troponins. Cardiology consult from Dr. Chase Picket appreciated: He states that the patient should be brought in for observation however he does not feel that she needs a cardiology admission he recommends internal medicine.  Patient is admitted to [facility] to MedSurg floor. In the care of Dr. Julian Reil.   Medications  aspirin chewable tablet 324 mg (324 mg Oral Given 04/09/13 2349)  predniSONE (DELTASONE) tablet 60 mg (60 mg Oral Given 04/10/13 0051)  LORazepam (ATIVAN) injection 1 mg (1 mg Intravenous Given 04/10/13 0052)  albuterol (PROVENTIL) (5 MG/ML) 0.5% nebulizer solution 5 mg (5 mg Nebulization Given 04/10/13 0030)  ipratropium (ATROVENT) nebulizer solution 0.5 mg (0.5 mg Nebulization Given 04/10/13 0030)       Wynetta Emery, PA-C 04/10/13 2013

## 2013-04-09 NOTE — ED Notes (Signed)
Spoke with Suzie in the lab. They will add on the Diff to the CBC already completed and convert the BMET to a CMET.

## 2013-04-10 ENCOUNTER — Encounter (HOSPITAL_COMMUNITY): Payer: Self-pay

## 2013-04-10 DIAGNOSIS — I5181 Takotsubo syndrome: Secondary | ICD-10-CM

## 2013-04-10 DIAGNOSIS — F419 Anxiety disorder, unspecified: Secondary | ICD-10-CM | POA: Diagnosis present

## 2013-04-10 DIAGNOSIS — F411 Generalized anxiety disorder: Secondary | ICD-10-CM

## 2013-04-10 DIAGNOSIS — R072 Precordial pain: Secondary | ICD-10-CM

## 2013-04-10 DIAGNOSIS — R079 Chest pain, unspecified: Secondary | ICD-10-CM

## 2013-04-10 LAB — HEPATIC FUNCTION PANEL
ALT: 18 U/L (ref 0–35)
AST: 19 U/L (ref 0–37)
Alkaline Phosphatase: 143 U/L — ABNORMAL HIGH (ref 39–117)
Bilirubin, Direct: <0.1 mg/dL (ref 0.0–0.3)
Total Bilirubin: 0.2 mg/dL — ABNORMAL LOW (ref 0.3–1.2)

## 2013-04-10 MED ORDER — LORAZEPAM 2 MG/ML IJ SOLN
1.0000 mg | Freq: Once | INTRAMUSCULAR | Status: AC
  Administered 2013-04-10: 1 mg via INTRAVENOUS
  Filled 2013-04-10: qty 1

## 2013-04-10 MED ORDER — ALBUTEROL SULFATE (5 MG/ML) 0.5% IN NEBU
5.0000 mg | INHALATION_SOLUTION | Freq: Once | RESPIRATORY_TRACT | Status: AC
  Administered 2013-04-10: 5 mg via RESPIRATORY_TRACT
  Filled 2013-04-10: qty 1

## 2013-04-10 MED ORDER — IPRATROPIUM BROMIDE 0.02 % IN SOLN
0.5000 mg | Freq: Once | RESPIRATORY_TRACT | Status: AC
  Administered 2013-04-10: 0.5 mg via RESPIRATORY_TRACT
  Filled 2013-04-10: qty 2.5

## 2013-04-10 MED ORDER — IPRATROPIUM BROMIDE 0.02 % IN SOLN
500.0000 ug | Freq: Three times a day (TID) | RESPIRATORY_TRACT | Status: DC | PRN
  Administered 2013-04-10: 500 ug via RESPIRATORY_TRACT
  Filled 2013-04-10: qty 2.5

## 2013-04-10 MED ORDER — FAMOTIDINE 20 MG PO TABS
20.0000 mg | ORAL_TABLET | Freq: Every day | ORAL | Status: DC
  Administered 2013-04-10: 20 mg via ORAL
  Filled 2013-04-10: qty 1

## 2013-04-10 MED ORDER — SALINE SPRAY 0.65 % NA SOLN
1.0000 | NASAL | Status: DC | PRN
  Filled 2013-04-10: qty 44

## 2013-04-10 MED ORDER — HEPARIN SODIUM (PORCINE) 5000 UNIT/ML IJ SOLN
5000.0000 [IU] | Freq: Three times a day (TID) | INTRAMUSCULAR | Status: DC
  Administered 2013-04-10: 5000 [IU] via SUBCUTANEOUS
  Filled 2013-04-10 (×4): qty 1

## 2013-04-10 MED ORDER — ALBUTEROL SULFATE (5 MG/ML) 0.5% IN NEBU
2.5000 mg | INHALATION_SOLUTION | Freq: Four times a day (QID) | RESPIRATORY_TRACT | Status: DC | PRN
  Administered 2013-04-10: 2.5 mg via RESPIRATORY_TRACT
  Filled 2013-04-10: qty 0.5

## 2013-04-10 MED ORDER — SERTRALINE HCL 50 MG PO TABS
50.0000 mg | ORAL_TABLET | Freq: Every day | ORAL | Status: DC
  Administered 2013-04-10: 50 mg via ORAL
  Filled 2013-04-10: qty 1

## 2013-04-10 MED ORDER — BUDESONIDE-FORMOTEROL FUMARATE 160-4.5 MCG/ACT IN AERO
2.0000 | INHALATION_SPRAY | Freq: Two times a day (BID) | RESPIRATORY_TRACT | Status: DC
  Administered 2013-04-10: 2 via RESPIRATORY_TRACT
  Filled 2013-04-10: qty 6

## 2013-04-10 MED ORDER — PREDNISONE 20 MG PO TABS
40.0000 mg | ORAL_TABLET | Freq: Every day | ORAL | Status: DC
  Filled 2013-04-10: qty 2

## 2013-04-10 MED ORDER — ALBUTEROL SULFATE HFA 108 (90 BASE) MCG/ACT IN AERS: 2.0000 | INHALATION_SPRAY | Freq: Four times a day (QID) | RESPIRATORY_TRACT | Status: DC | PRN

## 2013-04-10 MED ORDER — DIAZEPAM 5 MG PO TABS
5.0000 mg | ORAL_TABLET | Freq: Four times a day (QID) | ORAL | Status: DC | PRN
  Administered 2013-04-10: 5 mg via ORAL
  Filled 2013-04-10: qty 1

## 2013-04-10 MED ORDER — PREDNISONE 20 MG PO TABS
60.0000 mg | ORAL_TABLET | Freq: Once | ORAL | Status: AC
  Administered 2013-04-10: 60 mg via ORAL
  Filled 2013-04-10: qty 3

## 2013-04-10 MED ORDER — PANTOPRAZOLE SODIUM 40 MG PO TBEC
40.0000 mg | DELAYED_RELEASE_TABLET | Freq: Every day | ORAL | Status: DC
  Administered 2013-04-10: 40 mg via ORAL
  Filled 2013-04-10: qty 1

## 2013-04-10 MED ORDER — SODIUM CHLORIDE 0.9 % IJ SOLN: 3.0000 mL | Freq: Two times a day (BID) | INTRAMUSCULAR | Status: DC

## 2013-04-10 NOTE — Progress Notes (Signed)
Echocardiogram 2D Echocardiogram has been performed.  Neesa Knapik 04/10/2013, 10:47 AM

## 2013-04-10 NOTE — Care Management Note (Signed)
    Page 1 of 1   04/10/2013     2:15:37 PM   CARE MANAGEMENT NOTE 04/10/2013  Patient:  Heidi [name],Heidi [name]   Account Number:  [id number]  Date Initiated:  04/10/2013  Documentation initiated by:  Lanier Clam  Subjective/Objective Assessment:   ADMITTED W/ANXIETY.RU:EAVW,[id number]     Action/Plan:   FROM HOME W/KIDS.   Anticipated DC Date:  04/11/2013   Anticipated DC Plan:  HOME/SELF CARE      DC Planning Services  CM consult      Choice offered to / List presented to:             Status of service:  Completed, signed off Medicare Important Message given?   (If response is "NO", the following Medicare IM given date fields will be blank) Date Medicare IM given:   Date Additional Medicare IM given:    Discharge Disposition:  HOME/SELF CARE  Per UR Regulation:  Reviewed for med. necessity/level of care/duration of stay  If discussed at Long Length of Stay Meetings, dates discussed:    Comments:  04/10/13 [facility] RN,BSN NCM [phone]

## 2013-04-10 NOTE — Progress Notes (Signed)
Patient seen and examined by me.  See H&P by Dr. Julian Reil.  CP resolved, await echo.  Hope for d/c soon.  Jessica vann DO

## 2013-04-10 NOTE — ED Notes (Signed)
Report called to floor RN, Neysa Bonito

## 2013-04-10 NOTE — Discharge Summary (Signed)
Physician Discharge Summary  ALVAH GILDER UJW:[id number] DOB: [birth year] DOA: 04/09/2013  PCP: Kaleen Mask, MD  Admit date: 04/09/2013 Discharge date: 04/11/2013  Time spent: 35 minutes  Recommendations for Outpatient Follow-up:  1. pulm rehab follow up  Discharge Diagnoses:  Principal Problem:   Anxiety Active Problems:   Takotsubo cardiomyopathy   Discharge Condition: improved  Diet recommendation: cardiac  Filed Weights   04/09/13 2241 04/10/13 0630  Weight: 59.421 kg (131 lb) 56.564 kg (124 lb 11.2 oz)    History of present illness:  Heidi [name] is a 57 y.o. female h/o COPD 3L O2 via [place] at night and RA during day, and takotsubo cardiomyopathy (clean cath in 12/2011) who presents to the ED with chest discomfort and shortness of breath. SOB has been slightly worse over the past few days, CP onset earlier tonight. Pain is pressure like quality and located sub sternally in the center of her chest, symptoms were relieved in the ED with Ativan.  Because EDP does not feel comfortable sending patient home, hospitalist has been asked to admit as r/o cardiac.   Hospital Course:  1. Anxiety - continue home meds for now, has improved after ativan in ED. 2. Chest pain - most likely secondary to anxiety, r/o Takotsubo as echo ok, CE negative. 3. H/o Takotsubo cardiomyopathy - see above; strongly doubt ACS given negative cath a year ago. 4. COPD - chronic and stable at baseline   Procedures:  Echo: Left ventricle: The cavity size was normal. Wall thickness was normal. Systolic function was normal. The estimated ejection fraction was in the range of 55% to 60%. Although no diagnostic regional wall motion abnormality was identified, this possibility cannot be completely excluded on the basis of this study. Doppler parameters are consistent with abnormal left ventricular relaxation (grade 1 diastolic dysfunction). - Aortic valve: There was no stenosis. - Mitral  valve: No significant regurgitation. - Right ventricle: The cavity size was normal. Systolic function was normal. - Pulmonary arteries: No complete TR doppler jet so unable to estimate PA systolic pressure. - Systemic veins: IVC was not visualized. Impressions:  - Normal LV size and systolic function, EF 55-60%. Normal RV size and systolic function. No significant valvular abnormalities.    Consultations:  none  Discharge Exam: Filed Vitals:   04/10/13 0840 04/10/13 0847 04/10/13 1300 04/10/13 1411  BP:   126/79 131/75  Pulse:   78 98  Temp:   98 F (36.7 C) 98.1 F (36.7 C)  TempSrc:   Oral Oral  Resp:   22 20  Height:      Weight:      SpO2: 100% 100% 100% 100%    General: A+Ox3, NAD Cardiovascular: rrr Respiratory: decreased airation- on home 3 L  Discharge Instructions      Discharge Orders   Future Orders Complete By Expires     Diet general  As directed     Discharge instructions  As directed     Comments:      Continue pulm rehab    Discharge instructions  As directed     Comments:      Resume home O2    Increase activity slowly  As directed         Medication List    TAKE these medications       albuterol 108 (90 BASE) MCG/ACT inhaler  Commonly known as:  PROAIR HFA  Inhale 2 puffs into the lungs every 6 (six) hours as needed for wheezing.  albuterol (2.5 MG/3ML) 0.083% nebulizer solution  Commonly known as:  PROVENTIL  Take 2.5 mg by nebulization every 6 (six) hours as needed for wheezing.     budesonide-formoterol 160-4.5 MCG/ACT inhaler  Commonly known as:  SYMBICORT  Inhale 2 puffs into the lungs 2 (two) times daily.     diazepam 5 MG tablet  Commonly known as:  VALIUM  Take 1 tablet (5 mg total) by mouth every 6 (six) hours as needed for anxiety.     ipratropium 0.02 % nebulizer solution  Commonly known as:  ATROVENT  Take 500 mcg by nebulization 3 (three) times daily as needed for wheezing.     pantoprazole 40 MG tablet   Commonly known as:  PROTONIX  Take 1 tablet (40 mg total) by mouth daily. Take 30-60 min before first meal of the day     ranitidine 150 MG tablet  Commonly known as:  ZANTAC  Take 150 mg by mouth daily as needed for heartburn.     sertraline 50 MG tablet  Commonly known as:  ZOLOFT  Take 50 mg by mouth daily.     sodium chloride 0.65 % Soln nasal spray  Commonly known as:  OCEAN  Place 1 spray into the nose as needed for congestion.       Allergies  Allergen Reactions  . Codeine Nausea Only   Follow-up Information   Follow up with Kaleen Mask, MD In 1 week.   Contact information:   [address] [place] [address] [phone]       Follow up with Sandrea Hughs, MD In 1 month.   Contact information:   [address] [place] [address] [phone]        The results of significant diagnostics from this hospitalization (including imaging, microbiology, ancillary and laboratory) are listed below for reference.    Significant Diagnostic Studies: Dg Chest Port 1 View  04/09/2013   *RADIOLOGY REPORT*  Clinical Data: Shortness of breath and cough.  PORTABLE CHEST - 1 VIEW  Comparison: 01/06/2013  Findings: Stable COPD.  No infiltrate, edema, pneumothorax or pleural fluid is identified.  Heart size and mediastinal contours remain normal.  IMPRESSION: Stable COPD.  No acute findings.   Original Report Authenticated By: Irish Lack, M.D.    Microbiology: No results found for this or any previous visit (from the past 240 hour(s)).   Labs: Basic Metabolic Panel:  Recent Labs Lab 04/09/13 2307  NA 138  K 3.6  CL 100  CO2 27  GLUCOSE 138*  BUN 6  CREATININE 0.72  CALCIUM 9.9   Liver Function Tests:  Recent Labs Lab 04/09/13 2307  AST 19  ALT 18  ALKPHOS 143*  BILITOT 0.2*  PROT 8.0  ALBUMIN 3.9   No results found for this basename: LIPASE, AMYLASE,  in the last 168 hours No results found for this basename: AMMONIA,  in the  last 168 hours CBC:  Recent Labs Lab 04/09/13 2307  WBC 9.5  NEUTROABS 7.5  HGB 13.3  HCT 38.8  MCV 95.3  PLT 319   Cardiac Enzymes:  Recent Labs Lab 04/10/13 0552 04/10/13 1255  TROPONINI <0.30 <0.30   BNP: BNP (last 3 results)  Recent Labs  04/09/13 2307  PROBNP 71.7   CBG: No results found for this basename: GLUCAP,  in the last 168 hours     Signed:  Marlin Canary  [facility] 04/11/2013, 11:43 AM

## 2013-04-10 NOTE — H&P (Addendum)
[facility] History and Physical  LESSIE FUNDERBURKE ZOX:[id number] DOB: Dec 05, 1955 DOA: 04/09/2013  Referring physician: ED PCP: Kaleen Mask, MD   Chief Complaint: Chest pain  HPI: Heidi [name] is a 57 y.o. female h/o COPD 3L O2 via [place] at night and RA during day, and takotsubo cardiomyopathy (clean cath in 12/2011) who presents to the ED with chest discomfort and shortness of breath.  SOB has been slightly worse over the past few days, CP onset earlier tonight.  Pain is pressure like quality and located sub sternally in the center of her chest, symptoms were relieved in the ED with Ativan.  Because EDP does not feel comfortable sending patient home, hospitalist has been asked to admit as r/o cardiac.  Review of Systems: 12 systems reviewed and otherwise negative.  Past Medical History  Diagnosis Date  . COPD (chronic obstructive pulmonary disease)   . NSTEMI (non-ST elevated myocardial infarction)     12/2011 with normal coronaries possibly secondary to Takotsubo (EF 30-35% by echo), occurred following two episodes of acute respiratory distress  . Hypotension     limiting med titration  . Takotsubo syndrome 4/13   Past Surgical History  Procedure Laterality Date  . Abdominal hysterectomy    . Vagina rectal repair      after childbirth  . Cardiac catheterization     Social History:  reports that she quit smoking about 14 months ago. Her smoking use included Cigarettes. She smoked 0.00 packs per day for 30 years. She has never used smokeless tobacco. She reports that  drinks alcohol. She reports that she does not use illicit drugs.   Allergies  Allergen Reactions  . Codeine Nausea Only    Family History  Problem Relation Age of Onset  . Heart attack Mother     deceased from massive MI at age 3  . Liver disease Father     fatty liver; living, age 31  . Stroke Brother     at age 65, living   . Hypertension Brother     living, age 25    Prior to Admission  medications   Medication Sig Start Date End Date Taking? Authorizing Provider  albuterol (PROAIR HFA) 108 (90 BASE) MCG/ACT inhaler Inhale 2 puffs into the lungs every 6 (six) hours as needed for wheezing. 03/26/13  Yes Nyoka Cowden, MD  albuterol (PROVENTIL) (2.5 MG/3ML) 0.083% nebulizer solution Take 2.5 mg by nebulization every 6 (six) hours as needed for wheezing.   Yes Historical Provider, MD  budesonide-formoterol (SYMBICORT) 160-4.5 MCG/ACT inhaler Inhale 2 puffs into the lungs 2 (two) times daily. 03/07/13  Yes Nyoka Cowden, MD  diazepam (VALIUM) 5 MG tablet Take 1 tablet (5 mg total) by mouth every 6 (six) hours as needed for anxiety. 02/05/13  Yes Nyoka Cowden, MD  ipratropium (ATROVENT) 0.02 % nebulizer solution Take 500 mcg by nebulization 3 (three) times daily as needed for wheezing.   Yes Historical Provider, MD  pantoprazole (PROTONIX) 40 MG tablet Take 1 tablet (40 mg total) by mouth daily. Take 30-60 min before first meal of the day 01/06/13  Yes Nyoka Cowden, MD  ranitidine (ZANTAC) 150 MG tablet Take 150 mg by mouth daily as needed for heartburn.   Yes Historical Provider, MD  sertraline (ZOLOFT) 50 MG tablet Take 50 mg by mouth daily.   Yes Historical Provider, MD  sodium chloride (OCEAN) 0.65 % SOLN nasal spray Place 1 spray into the nose as needed for congestion.  02/02/12  Yes Gwenyth Bender, NP   Physical Exam: Filed Vitals:   04/09/13 2241 04/10/13 0030 04/10/13 0032  BP:  139/80   Pulse:  81   Temp: 98.4 F (36.9 C)    TempSrc: Oral    Resp: 21 18   Height: 5\' 6"  (1.676 m)    Weight: 59.421 kg (131 lb)    SpO2: 99% 100% 100%    General:  NAD, resting comfortably in bed Eyes: PEERLA EOMI ENT: mucous membranes moist Neck: supple w/o JVD Cardiovascular: RRR w/o MRG Respiratory: CTA B Abdomen: soft, nt, nd, bs+ Skin: no rash nor lesion Musculoskeletal: MAE, full ROM all 4 extremities Psychiatric: anxious tone and affect Neurologic: AAOx3, grossly  non-focal  Labs on Admission:  Basic Metabolic Panel:  Recent Labs Lab 04/09/13 2307  NA 138  K 3.6  CL 100  CO2 27  GLUCOSE 138*  BUN 6  CREATININE 0.72  CALCIUM 9.9   Liver Function Tests:  Recent Labs Lab 04/09/13 2307  AST 19  ALT 18  ALKPHOS 143*  BILITOT 0.2*  PROT 8.0  ALBUMIN 3.9   No results found for this basename: LIPASE, AMYLASE,  in the last 168 hours No results found for this basename: AMMONIA,  in the last 168 hours CBC:  Recent Labs Lab 04/09/13 2307  WBC 9.5  NEUTROABS 7.5  HGB 13.3  HCT 38.8  MCV 95.3  PLT 319   Cardiac Enzymes: No results found for this basename: CKTOTAL, CKMB, CKMBINDEX, TROPONINI,  in the last 168 hours  BNP (last 3 results)  Recent Labs  04/09/13 2307  PROBNP 71.7   CBG: No results found for this basename: GLUCAP,  in the last 168 hours  Radiological Exams on Admission: Dg Chest Port 1 View  04/09/2013   *RADIOLOGY REPORT*  Clinical Data: Shortness of breath and cough.  PORTABLE CHEST - 1 VIEW  Comparison: 01/06/2013  Findings: Stable COPD.  No infiltrate, edema, pneumothorax or pleural fluid is identified.  Heart size and mediastinal contours remain normal.  IMPRESSION: Stable COPD.  No acute findings.   Original Report Authenticated By: Irish Lack, M.D.    EKG: Independently reviewed.  Assessment/Plan Principal Problem:   Anxiety Active Problems:   Takotsubo cardiomyopathy   1. Anxiety - continue home meds for now, has improved after ativan in ED. 2. Chest pain - most likely secondary to anxiety, but will r/o Takotsubo as below. 3. H/o Takotsubo cardiomyopathy - repeat 2d echo ordered which should show reduced EF if this is indeed recurring, serial troponins also ordered to rule out a recurrence, strongly doubt ACS given negative cath a year ago. 4. COPD - chronic and stable at baseline    Code Status: Full Code (must indicate code status--if unknown or must be presumed, indicate so) Family  Communication: no family in room (indicate person spoken with, if applicable, with phone number if by telephone) Disposition Plan: Admit to obs (indicate anticipated LOS)  Time spent: 50 min  GARDNER, JARED M. [facility] Pager [phone]  If 7PM-7AM, please contact night-coverage [email or web address] Password [email or web address] 04/10/2013, 5:23 AM

## 2013-04-10 NOTE — ED Provider Notes (Signed)
Medical screening examination/treatment/procedure(s) were performed by non-physician practitioner and as supervising physician I was immediately available for consultation/collaboration.  Sunnie Nielsen, MD 04/10/13 2308

## 2013-04-13 ENCOUNTER — Emergency Department: Payer: Self-pay | Admitting: Emergency Medicine

## 2013-04-13 LAB — COMPREHENSIVE METABOLIC PANEL
Albumin: 4.1 g/dL (ref 3.4–5.0)
Alkaline Phosphatase: 138 U/L — ABNORMAL HIGH (ref 50–136)
Bilirubin,Total: 0.4 mg/dL (ref 0.2–1.0)
Calcium, Total: 9.7 mg/dL (ref 8.5–10.1)
Chloride: 98 mmol/L (ref 98–107)
Creatinine: 0.75 mg/dL (ref 0.60–1.30)
EGFR (African American): >60
EGFR (Non-African Amer.): >60
Glucose: 119 mg/dL — ABNORMAL HIGH (ref 65–99)
Osmolality: 270 (ref 275–301)
Potassium: 4 mmol/L (ref 3.5–5.1)
SGPT (ALT): 23 U/L (ref 12–78)
Total Protein: 7.9 g/dL (ref 6.4–8.2)

## 2013-04-13 LAB — CBC
HCT: 40.2 % (ref 35.0–47.0)
Platelet: 309 10*3/uL (ref 150–440)
RBC: 4.19 10*6/uL (ref 3.80–5.20)

## 2013-04-13 LAB — URINALYSIS, COMPLETE
Glucose,UR: NEGATIVE mg/dL (ref 0–75)
Protein: NEGATIVE
RBC,UR: 1 /HPF (ref 0–5)
Specific Gravity: 1.003 (ref 1.003–1.030)
Squamous Epithelial: 1

## 2013-04-15 ENCOUNTER — Other Ambulatory Visit: Payer: Self-pay | Admitting: Internal Medicine

## 2013-04-29 ENCOUNTER — Encounter: Payer: Self-pay | Admitting: Internal Medicine

## 2013-05-14 ENCOUNTER — Ambulatory Visit (INDEPENDENT_AMBULATORY_CARE_PROVIDER_SITE_OTHER): Payer: Medicaid Other | Admitting: Internal Medicine

## 2013-05-14 ENCOUNTER — Encounter: Payer: Self-pay | Admitting: Internal Medicine

## 2013-05-14 VITALS — BP 122/70 | HR 88 | Temp 98.0°F | Ht 66.0 in | Wt 133.4 lb

## 2013-05-14 DIAGNOSIS — J449 Chronic obstructive pulmonary disease, unspecified: Secondary | ICD-10-CM

## 2013-05-14 DIAGNOSIS — J9610 Chronic respiratory failure, unspecified whether with hypoxia or hypercapnia: Secondary | ICD-10-CM

## 2013-05-14 MED ORDER — ACLIDINIUM BROMIDE 400 MCG/ACT IN AEPB: 1.0000 | INHALATION_SPRAY | Freq: Two times a day (BID) | RESPIRATORY_TRACT | Status: DC

## 2013-05-14 NOTE — Assessment & Plan Note (Signed)
-   01/06/2013  Walked RA x 3 laps @ 185 ft each stopped due to end of study, no desat  REc 3lpm at hs,  And 3lpm prn  with heavy exertion

## 2013-05-14 NOTE — Progress Notes (Signed)
Subjective:     Patient ID: Heidi [name], female   DOB: 03-08-1956  MRN: [id number]   Primary = [facility] in [place]  Brief patient profile:  57 yowf quit smoking 01/23/2012 at admit to [facility] with aecopd  With GOLD III COPD dx 02/2012      09/05/2012 f/u ov/Wert cc breathing better since last admit but still feeling like she needs saba including one hour after symbicort when took xanax use of albuterol went way down, vs when ran out back up to 4 x daily saba now living in [place], only uses 02 at hs. rec Try Sue Lush one twice daily after symbicort as part of plan A Alprazolam 0.5 mg one half to one three times as needed for nerves Plan B if you can't catch your breath after Plan A is being done correctly and consistently, then ok to use albuterol in hfa/puffer/inhaler Plan C if Plan B not working and still can't catch your breath, ok to use the nebulizer up to every 3 hours Plan D = Call the Doctor PLan E = ER    10/10/2012 f/u ov/Wert cc ? Benefit from tudorza then stopped it due to cough which seemed to improve p stopped it but still hoarse and excessive urge to clear throat, using saba neb once a day (before goes out) and as has no hfa saba.  Only using 02 at hs. p stopped tudorza resumed qid atrovent hfa   Also reports intermittent severe HB rx with otc antacids helps. rec pantoprazole 40 mg Take 30-60 min before first meal of the day and pepcid 20 mg at bedtime Plan A = symbicort and atrovent perfectly regulalry - your technique is so good you should really start seeing the benefit. Only use your albuterol (plan B proair puffer, plan C is nebulizer)   GERD diet    01/06/2013 f/u ov/Wert cc not following any of the instructions from previous ov. Over using neb, using otc ranitidine in subtherapeutic doses, c/o sob room to room. Using atrovent tid  rec Pantoprazole 40 mg Take 30-60 min before first meal of the day and ranitidine 150  2  at bedtime Plan A = symbicort(2x2) and  atrovent(2x4) perfectly regulalry - your technique is so good you should really start seeing the benefit. Only use your albuterol (plan B proair puffer, plan C is nebulizer) as a rescue medication   GERD diet   02/05/2013 f/u ov/Wert f/u GOLD IV COPD Chief Complaint  Patient presents with  . Follow-up    Pt states that her breathing is about the same since last visit, she c/o wheezing off and on for the past wk.She has also noticed waking up the past 3-4 nights feeling SOB.    following rx for symbiocrt / atrovent consistenly and less need for saba hfa and neb x 2 since last ov and using 02 3lpm with exertion but not consitently rec Continue 02 3lpm at bedtime  Stop xanax and use valium 5 mg every 6 hours as needed for breathless  03/07/2013 f/u ov/Wert re copd Chief Complaint  Patient presents with  . Follow-up    Breathing is some better, not waking up feeling SOB since the last visit. She started pulmonary rehab this wk.    valium 5 mg at hs only and saba hfa once daily and no neb at all at this point Think of your respiratory medications as multiple steps you can take to control your symptoms and avoid having to go to the  ER   Plan A is your maintenance daily no matter what meds: symbicort and atrovent Plan B only use after you've used your maintenance (Plan A)  medication, and only if you can't catch your breath: proaire up to 2 puffs 4hours as needed Plan C only use after you've used plan A and B and still can't catch your breath: nebulizer every 4 hours  Plan D(for Doctor):  If you've used A thru C and not doing a lot better or still needing C more than a once a day,  D = call the doctor for evaluation asap Plan E (for ER):  If still not able to catch your breath, even after using your nebulizer up to every 4 hours, go to ER 02 3lpm sleeping, 3lpm with exertion (unless being monitored  And 02 sats are staying above 90%)   05/14/2013 f/u ov/Wert copd/ 02 dep at hs and with treadmill  at rehab Chief Complaint  Patient presents with  . Follow-up    Pt states her breathing seems to be under control since her last visit-relates to attending rehab   not using much saba at all in hfa form, much less neb as well.  No obvious daytime variabilty or assoc chronic cough or cp or chest tightness, subjective wheeze overt sinus symptoms. No unusual exp hx   Sleeping ok without nocturnal  or early am exacerbation  of respiratory  c/o's or need for noct saba. Also denies any obvious fluctuation of symptoms with weather or environmental changes or other aggravating or alleviating factors except as outlined above   ROS  The following are not active complaints unless bolded sore throat, dysphagia, dental problems, itching, sneezing,  nasal congestion or excess/ purulent secretions, ear ache,   fever, chills, sweats, unintended wt loss, pleuritic or exertional cp, hemoptysis,  orthopnea pnd or leg swelling, presyncope, palpitations, heartburn, abdominal pain, anorexia, nausea, vomiting, diarrhea  or change in bowel or urinary habits, change in stools or urine, dysuria,hematuria,  rash, arthralgias, visual complaints, headache, numbness weakness or ataxia or problems with walking or coordination,  change in mood/affect or memory.                          Objective:      Wt 114 02/26/2012 >  08/08/2012 >120 08/15/2012 > 119 09/05/2012 >  10/10/2012 123> 126 01/06/2013  > 02/05/2013 129 > 03/07/2013    >  133 05/14/2013   Anxious amb wf nad at rest RA    HEENT mild turbinate edema.  Oropharynx no thrush or excess pnd or cobblestoning.  No JVD or cervical adenopathy. Mild accessory muscle hypertrophy. Trachea midline, nl thryroid. Chest was hyperinflated by percussion with diminished breath sounds and moderate increased exp time without wheeze. Hoover sign positive at mid inspiration. Regular rate and rhythm without murmur gallop or rub or increase P2 or edema.  Abd: no hsm, nl excursion. Ext  warm without cyanosis or clubbing.     cxr 6/11/4 Stable COPD. No acute findings.            Assessment:           Outpatient Encounter Prescriptions as of 05/14/2013  Medication Sig Dispense Refill  . albuterol (PROAIR HFA) 108 (90 BASE) MCG/ACT inhaler Inhale 2 puffs into the lungs every 6 (six) hours as needed for wheezing.  1 Inhaler  2  . albuterol (PROVENTIL) (2.5 MG/3ML) 0.083% nebulizer solution Take 2.5 mg by nebulization every  6 (six) hours as needed for wheezing.      . budesonide-formoterol (SYMBICORT) 160-4.5 MCG/ACT inhaler Inhale 2 puffs into the lungs 2 (two) times daily.  1 Inhaler  11  . diazepam (VALIUM) 5 MG tablet Take 1 tablet (5 mg total) by mouth every 6 (six) hours as needed for anxiety.  120 tablet  2  . escitalopram (LEXAPRO) 5 MG tablet Take 5 mg by mouth daily.      Marland Kitchen ipratropium (ATROVENT) 0.02 % nebulizer solution INHALE ONE VIAL IN NEBULIZER EVERY 4 HOURS  375 mL  3  . pantoprazole (PROTONIX) 40 MG tablet Take 1 tablet (40 mg total) by mouth daily. Take 30-60 min before first meal of the day  30 tablet  2  . ranitidine (ZANTAC) 150 MG tablet Take 150 mg by mouth daily as needed for heartburn.      . sertraline (ZOLOFT) 50 MG tablet Take 50 mg by mouth daily.      . sodium chloride (OCEAN) 0.65 % SOLN nasal spray Place 1 spray into the nose as needed for congestion.  1 Bottle    . Aclidinium Bromide (TUDORZA PRESSAIR) 400 MCG/ACT AEPB Inhale 1 puff into the lungs 2 (two) times daily. One twice daily  1 each  11   No facility-administered encounter medications on file as of 05/14/2013.

## 2013-05-14 NOTE — Assessment & Plan Note (Addendum)
-   HFA 90% 10/10/2012     - PFT's 03/27/2012 FEV1 0.72 ( 27%) ratio 45 and 18 % better p B2 (to 32% predicted) , DLCO 48 corrects to 79%    - Referred to rehab 01/06/2013  Doing generally better with rehab, min 02 requirments daytime and min need for saba but having to use atroven qid and makes more sense to use turdorza bid if she can afford it and tolerate it  The proper method of use, as well as anticipated side effects, of a metered-dose inhaler are discussed and demonstrated to the patient. Improved effectiveness after extensive coaching during this visit to a level of approximately  90% so 2 week sample given on trial basis    Each maintenance medication was reviewed in detail including most importantly the difference between maintenance and as needed and under what circumstances the prns are to be used.  Please see instructions for details which were reviewed in writing and the patient given a copy.

## 2013-05-14 NOTE — Patient Instructions (Addendum)
Try tudorza one puff twice daily after symbicort in place of atrovent on a trial basis but rinse and gargle after use    If you are satisfied with your treatment plan let your doctor know and he/she can either refill your medications or you can return here when your prescription runs out.     If in any way you are not 100% satisfied,  please tell us.  If 100% better, tell your friends!

## 2013-05-23 ENCOUNTER — Telehealth: Payer: Self-pay | Admitting: Internal Medicine

## 2013-05-23 MED ORDER — ACLIDINIUM BROMIDE 400 MCG/ACT IN AEPB: 1.0000 | INHALATION_SPRAY | Freq: Two times a day (BID) | RESPIRATORY_TRACT | Status: DC

## 2013-05-23 NOTE — Telephone Encounter (Signed)
Spoke with Lawson Fiscal at [facility] Per Ronie Spies Rx needs to either say 30 actuations or 60 actuations Patient is doing tudorza BID so verbal given for 60 actuations and 11 refills Nothing further needed at this time

## 2013-05-30 ENCOUNTER — Encounter: Payer: Self-pay | Admitting: Internal Medicine

## 2013-06-16 ENCOUNTER — Ambulatory Visit: Payer: Medicaid Other | Admitting: Pulmonary Disease

## 2013-06-29 ENCOUNTER — Emergency Department: Payer: Self-pay | Admitting: Emergency Medicine

## 2013-06-29 LAB — COMPREHENSIVE METABOLIC PANEL
Albumin: 3.6 g/dL (ref 3.4–5.0)
Anion Gap: 5 — ABNORMAL LOW (ref 7–16)
BUN: 11 mg/dL (ref 7–18)
Calcium, Total: 9.1 mg/dL (ref 8.5–10.1)
Chloride: 101 mmol/L (ref 98–107)
Co2: 29 mmol/L (ref 21–32)
Creatinine: 0.8 mg/dL (ref 0.60–1.30)
EGFR (African American): >60
Glucose: 114 mg/dL — ABNORMAL HIGH (ref 65–99)
Osmolality: 270 (ref 275–301)
Potassium: 3.9 mmol/L (ref 3.5–5.1)
SGOT(AST): 27 U/L (ref 15–37)
SGPT (ALT): 24 U/L (ref 12–78)

## 2013-06-29 LAB — CBC
HCT: 36.5 % (ref 35.0–47.0)
MCH: 33.4 pg (ref 26.0–34.0)
MCV: 94 fL (ref 80–100)
Platelet: 236 10*3/uL (ref 150–440)
RBC: 3.89 10*6/uL (ref 3.80–5.20)
RDW: 12.4 % (ref 11.5–14.5)
WBC: 7.7 10*3/uL (ref 3.6–11.0)

## 2013-06-30 ENCOUNTER — Encounter: Payer: Self-pay | Admitting: Internal Medicine

## 2013-06-30 LAB — URINALYSIS, COMPLETE
Bilirubin,UR: NEGATIVE
Blood: NEGATIVE
Nitrite: NEGATIVE
Protein: NEGATIVE
RBC,UR: 2 /HPF (ref 0–5)

## 2013-06-30 LAB — LIPASE, BLOOD: Lipase: 117 U/L (ref 73–393)

## 2013-06-30 LAB — TROPONIN I: Troponin-I: <0.02 ng/mL

## 2013-07-07 ENCOUNTER — Encounter: Payer: Self-pay | Admitting: Pulmonary Disease

## 2013-07-15 ENCOUNTER — Institutional Professional Consult (permissible substitution): Payer: Medicaid Other | Admitting: Pulmonary Disease

## 2013-08-05 ENCOUNTER — Ambulatory Visit (INDEPENDENT_AMBULATORY_CARE_PROVIDER_SITE_OTHER): Payer: Medicaid Other | Admitting: Pulmonary Disease

## 2013-08-05 ENCOUNTER — Encounter: Payer: Self-pay | Admitting: Pulmonary Disease

## 2013-08-05 VITALS — BP 110/74 | HR 82 | Ht 66.0 in | Wt 129.0 lb

## 2013-08-05 DIAGNOSIS — J9610 Chronic respiratory failure, unspecified whether with hypoxia or hypercapnia: Secondary | ICD-10-CM

## 2013-08-05 DIAGNOSIS — J449 Chronic obstructive pulmonary disease, unspecified: Secondary | ICD-10-CM

## 2013-08-05 MED ORDER — IPRATROPIUM-ALBUTEROL 20-100 MCG/ACT IN AERS: 1.0000 | INHALATION_SPRAY | Freq: Four times a day (QID) | RESPIRATORY_TRACT | Status: DC | PRN

## 2013-08-05 NOTE — Progress Notes (Signed)
Subjective:    Patient ID: Heidi [name], female    DOB: Jul 08, 1956, 57 y.o.   MRN: [id number]  Synopsis: Heidi [name] first saw Dr. Sherene [name] with the [facility] clinic in 2013. She has COPD and 8 2013 FEV1 was 0.72 L (27% predicted). She smoked heavily up until October of 2013.   HPI  08/05/2013 ROV >> Has been doing well since the lasst visit and [facility] weeks ago and is now doing "forever fit" there now too.  Typically she will exercise for about 50 minutes to one hour of exercise twice a week.  She has not been able to tolerate the [place].  She says that all powder given her trouble.  She is using the nebulizer in the morning, only does about 1/2 of a treatment and the other 1/2 later in the day.  She rarely uses the proAir, at most twice a day, many days she never needs it.  No cough at call.  She had a flu shot last week and has not had a flare of COPD in many months now.  Her last hospitalization was October 2013.   Past Medical History  Diagnosis Date  . COPD (chronic obstructive pulmonary disease)   . NSTEMI (non-ST elevated myocardial infarction)     12/2011 with normal coronaries possibly secondary to Takotsubo (EF 30-35% by echo), occurred following two episodes of acute respiratory distress  . Hypotension     limiting med titration  . Takotsubo syndrome 4/13      Review of Systems  Constitutional: Negative for fever, chills and fatigue.  HENT: Negative for congestion, rhinorrhea and postnasal drip.   Respiratory: Positive for shortness of breath. Negative for cough and wheezing.   Cardiovascular: Negative for chest pain, palpitations and leg swelling.       Objective:   Physical Exam Filed Vitals:   08/05/13 1131  BP: 110/74  Pulse: 82  Height: 5\' 6"  (1.676 m)  Weight: 129 lb (58.514 kg)  SpO2: 96%  RA  Gen: well appearing, no acute distress HEENT: NCAT, PERRL, EOMi, OP clear, neck supple without masses PULM: CTA B CV: RRR, no mgr, no  JVD AB: BS+, soft, nontender, no hsm Ext: warm, no edema, no clubbing, no cyanosis Derm: no rash or skin breakdown Neuro: A&Ox4, CN II-XII intact, strength 5/5 in all 4 extremities       Assessment & Plan:   COPD GOLD III This has been a stable interval for Heidi [name]. She is doing well on the Symbicort colon and her exercise tolerance is fantastic.  Plan: -Flu shot is up-to-date -Continue Symbicort -Change albuterol to Combivent to use on an as needed basis throughout the day when she is out of her house -Continue when necessary nebulized DuoNeb during the day  Chronic respiratory failure Continue 3 L each bedtime and with exertion    Updated Medication List Outpatient Encounter Prescriptions as of 08/05/2013  Medication Sig Dispense Refill  . albuterol (PROVENTIL) (2.5 MG/3ML) 0.083% nebulizer solution Take 2.5 mg by nebulization every 6 (six) hours as needed for wheezing.      . budesonide-formoterol (SYMBICORT) 160-4.5 MCG/ACT inhaler Inhale 2 puffs into the lungs 2 (two) times daily.  1 Inhaler  11  . diazepam (VALIUM) 5 MG tablet Take 1 tablet (5 mg total) by mouth every 6 (six) hours as needed for anxiety.  120 tablet  2  . escitalopram (LEXAPRO) 5 MG tablet Take 5 mg by mouth daily.      Marland Kitchen  ipratropium (ATROVENT) 0.02 % nebulizer solution INHALE ONE VIAL IN NEBULIZER EVERY 4 HOURS  375 mL  3  . pantoprazole (PROTONIX) 40 MG tablet Take 1 tablet (40 mg total) by mouth daily. Take 30-60 min before first meal of the day  30 tablet  2  . ranitidine (ZANTAC) 150 MG tablet Take 150 mg by mouth daily as needed for heartburn.      . sertraline (ZOLOFT) 50 MG tablet Take 50 mg by mouth daily.      . sodium chloride (OCEAN) 0.65 % SOLN nasal spray Place 1 spray into the nose as needed for congestion.  1 Bottle    . [DISCONTINUED] Aclidinium Bromide (TUDORZA PRESSAIR) 400 MCG/ACT AEPB Inhale 1 puff into the lungs 2 (two) times daily. One twice daily  60 each  11  . [DISCONTINUED]  albuterol (PROAIR HFA) 108 (90 BASE) MCG/ACT inhaler Inhale 2 puffs into the lungs every 6 (six) hours as needed for wheezing.  1 Inhaler  2  . Ipratropium-Albuterol (COMBIVENT RESPIMAT) 20-100 MCG/ACT AERS respimat Inhale 1 puff into the lungs every 6 (six) hours as needed for wheezing.  1 Inhaler  5   No facility-administered encounter medications on file as of 08/05/2013.

## 2013-08-05 NOTE — Patient Instructions (Signed)
Stop proAir and replace it with the Combivent to use as needed Keep using your oxygen as you are doing Keep exercising regularly We will see you back in 6 months or sooner if needed

## 2013-08-05 NOTE — Assessment & Plan Note (Signed)
Continue 3 L each bedtime and with exertion

## 2013-08-05 NOTE — Assessment & Plan Note (Addendum)
This has been a stable interval for Heidi [name]. She is doing well on the Symbicort colon and her exercise tolerance is fantastic.  Plan: -Flu shot is up-to-date -Continue Symbicort -Change albuterol to Combivent to use on an as needed basis throughout the day when she is out of her house -Continue when necessary nebulized DuoNeb during the day

## 2013-09-04 ENCOUNTER — Other Ambulatory Visit: Payer: Self-pay

## 2013-10-08 ENCOUNTER — Telehealth: Payer: Self-pay | Admitting: Pulmonary Disease

## 2013-10-08 NOTE — Telephone Encounter (Signed)
[id number] x 1 

## 2013-10-09 NOTE — Telephone Encounter (Signed)
lmtcb x1 

## 2013-10-09 NOTE — Telephone Encounter (Signed)
Returning call can be reached at [phone].Raylene Everts

## 2013-10-09 NOTE — Telephone Encounter (Signed)
Pt saw PCP on Monday and was dx with PNA at bottom of both lungs. No cxr was done.  Pt states that she was given Levaquin 500mg  1 qd x 10days.  Pt was told to contact PCP in 10 days if no better. Pt reports that she is a COPD Gold 3   Pt has appt with [facility] on Monday Dec 15 at 3pm. Would like to know if she will be okay to wait until Monday to see BQ or if something needs to be done before then? Also pt wanting to know if Levaquin 500mg  is proper treatment for this even though PCP is not 100% sure this is PNA.   Medication List             albuterol (2.5 MG/3ML) 0.083% nebulizer solution  Commonly known as:  PROVENTIL  Take 2.5 mg by nebulization every 6 (six) hours as needed for wheezing.     budesonide-formoterol 160-4.5 MCG/ACT inhaler  Commonly known as:  SYMBICORT  Inhale 2 puffs into the lungs 2 (two) times daily.     diazepam 5 MG tablet  Commonly known as:  VALIUM  Take 1 tablet (5 mg total) by mouth every 6 (six) hours as needed for anxiety.     escitalopram 5 MG tablet  Commonly known as:  LEXAPRO  Take 5 mg by mouth daily.     ipratropium 0.02 % nebulizer solution  Commonly known as:  ATROVENT  INHALE ONE VIAL IN NEBULIZER EVERY 4 HOURS     Ipratropium-Albuterol 20-100 MCG/ACT Aers respimat  Commonly known as:  COMBIVENT RESPIMAT  Inhale 1 puff into the lungs every 6 (six) hours as needed for wheezing.     pantoprazole 40 MG tablet  Commonly known as:  PROTONIX  Take 1 tablet (40 mg total) by mouth daily. Take 30-60 min before first meal of the day     ranitidine 150 MG tablet  Commonly known as:  ZANTAC  Take 150 mg by mouth daily as needed for heartburn.     sertraline 50 MG tablet  Commonly known as:  ZOLOFT  Take 50 mg by mouth daily.     sodium chloride 0.65 % Soln nasal spray  Commonly known as:  OCEAN  Place 1 spray into the nose as needed for congestion.       Allergies  Allergen Reactions  . Codeine Nausea Only  . Zoloft [Sertraline Hcl]      Increased anxiety   Please advise Dr Maple Hudson. thanks.

## 2013-10-09 NOTE — Telephone Encounter (Signed)
lmomtcb for pt 

## 2013-10-09 NOTE — Telephone Encounter (Signed)
Seeing Dr Kendrick Fries Dec 15 as planned would give reasonable time for the antibiotic to work. She should call sooner if she feels she is getting worse, or go to ER ( for instance over weekend).  Levaquin is a very reasonable antibiotic choice.

## 2013-10-10 NOTE — Telephone Encounter (Signed)
Spoke with pt and advised of Dr Young's recommendations.  Pt verbalized understanding. 

## 2013-10-13 ENCOUNTER — Ambulatory Visit (INDEPENDENT_AMBULATORY_CARE_PROVIDER_SITE_OTHER): Payer: Medicaid Other | Admitting: Pulmonary Disease

## 2013-10-13 ENCOUNTER — Ambulatory Visit (INDEPENDENT_AMBULATORY_CARE_PROVIDER_SITE_OTHER)
Admission: RE | Admit: 2013-10-13 | Discharge: 2013-10-13 | Disposition: A | Discharge status: Home / Self Care | Payer: Medicaid Other | Source: Ambulatory Visit | Attending: Pulmonary Disease | Admitting: Pulmonary Disease

## 2013-10-13 ENCOUNTER — Encounter: Payer: Self-pay | Admitting: Pulmonary Disease

## 2013-10-13 VITALS — BP 130/74 | HR 102 | Temp 98.2°F | Ht 66.0 in | Wt 130.0 lb

## 2013-10-13 DIAGNOSIS — J441 Chronic obstructive pulmonary disease with (acute) exacerbation: Secondary | ICD-10-CM

## 2013-10-13 MED ORDER — PREDNISONE 10 MG PO TABS: ORAL_TABLET | ORAL | Status: DC

## 2013-10-13 MED ORDER — IPRATROPIUM-ALBUTEROL 20-100 MCG/ACT IN AERS: 1.0000 | INHALATION_SPRAY | Freq: Four times a day (QID) | RESPIRATORY_TRACT | Status: DC | PRN

## 2013-10-13 MED ORDER — AMOXICILLIN-POT CLAVULANATE 875-125 MG PO TABS: 1.0000 | ORAL_TABLET | Freq: Two times a day (BID) | ORAL | Status: AC

## 2013-10-13 NOTE — Assessment & Plan Note (Signed)
Unfortunately Heidi [name] is having a flare of COPD despite good adherence to her medicines and staying active with pulmonary rehab.  I don't think this is pneumonia, but we will get a CXR to make sure.  Plan: -prednisone taper -change levaquin to Augmentin given tendon pain -CXR > if pneumonia will need admission -if worse, go to hospital -continue Symbicort -continue pulm rehab -consider roflumilast next visit -f/u 4-6 weeks or sooner if needed

## 2013-10-13 NOTE — Patient Instructions (Signed)
Take the prednisone taper as written Stop levaquin Take the augmentin twice a day for 5 days with yogurt We will call you with the results of the Chest X-ray  If you get worse, go to the hospital  We will see you back in 4-6 weeks or sooner if needed

## 2013-10-13 NOTE — Progress Notes (Signed)
Subjective:    Patient ID: Heidi [name], female    DOB: 1956-03-30, 57 y.o.   MRN: [id number]  Synopsis: Heidi [name] first saw Dr. Sherene Sires with the [facility] clinic in 2013. She has COPD and 8 2013 FEV1 was 0.72 L (27% predicted). She smoked heavily up until October of 2013.   HPI   08/05/2013 ROV >> Has been doing well since the lasst visit and [facility] and is now doing "forever fit" there now too.  Typically she will exercise for about 50 minutes to one hour of exercise twice a week.  She has not been able to tolerate the [place].  She says that all powder given her trouble.  She is using the nebulizer in the morning, only does about 1/2 of a treatment and the other 1/2 later in the day.  She rarely uses the proAir, at most twice a day, many days she never needs it.  No cough at call.  She had a flu shot last week and has not had a flare of COPD in many months now.  Her last hospitalization was October 2013.   10/13/2013 ROV >> Heidi [name] has been coughing more lately with some sputum production which is lime green in color.  She has had two weeks of symptoms because she thought she had bronchitis.  Apparently that doctor suspected pneumonia and prescribed an antibiotic (Levaquin) which she has been taking.  She has been more short of breath as well.  She has been using her oxygen more because of this.  She questions whether or not the levaquin is causing her to have tendon pain as she has been having some shoulder and elbow. She has been compliant with symbicort and had a pneumonia and flu shot this.    Past Medical History  Diagnosis Date  . COPD (chronic obstructive pulmonary disease)   . NSTEMI (non-ST elevated myocardial infarction)     12/2011 with normal coronaries possibly secondary to Takotsubo (EF 30-35% by echo), occurred following two episodes of acute respiratory distress  . Hypotension     limiting med titration  . Takotsubo syndrome 4/13       Review of Systems  Constitutional: Negative for fever, chills and fatigue.  HENT: Negative for congestion, postnasal drip and rhinorrhea.   Respiratory: Positive for shortness of breath. Negative for cough and wheezing.   Cardiovascular: Negative for chest pain, palpitations and leg swelling.       Objective:   Physical Exam  Filed Vitals:   10/13/13 1522  BP: 130/74  Pulse: 102  Temp: 98.2 F (36.8 C)  TempSrc: Oral  Height: 5\' 6"  (1.676 m)  Weight: 130 lb (58.968 kg)  SpO2: 95%  RA  Gen: well appearing, no acute distress HEENT: NCAT, EOMi, OP clear, PULM: Crackles, diminished air movement left base, few faint wheeze CV: RRR, no mgr, no JVD AB: BS+, soft, nontender, no hsm Ext: warm, no edema, no clubbing, no cyanosis      Assessment & Plan:   No problem-specific assessment & plan notes found for this encounter.   Updated Medication List Outpatient Encounter Prescriptions as of 10/13/2013  Medication Sig  . albuterol (PROVENTIL) (2.5 MG/3ML) 0.083% nebulizer solution Take 2.5 mg by nebulization every 6 (six) hours as needed for wheezing.  . budesonide-formoterol (SYMBICORT) 160-4.5 MCG/ACT inhaler Inhale 2 puffs into the lungs 2 (two) times daily.  Marland Kitchen escitalopram (LEXAPRO) 5 MG tablet Take 5 mg by mouth daily.  Marland Kitchen  ipratropium (ATROVENT) 0.02 % nebulizer solution INHALE ONE VIAL IN NEBULIZER EVERY 4 HOURS  . Ipratropium-Albuterol (COMBIVENT RESPIMAT) 20-100 MCG/ACT AERS respimat Inhale 1 puff into the lungs every 6 (six) hours as needed for wheezing.  Marland Kitchen levofloxacin (LEVAQUIN) 500 MG tablet Take 500 mg by mouth daily.  . pantoprazole (PROTONIX) 40 MG tablet Take 1 tablet (40 mg total) by mouth daily. Take 30-60 min before first meal of the day  . ranitidine (ZANTAC) 150 MG tablet Take 150 mg by mouth daily as needed for heartburn.  . sodium chloride (OCEAN) 0.65 % SOLN nasal spray Place 1 spray into the nose as needed for congestion.  . [DISCONTINUED]  diazepam (VALIUM) 5 MG tablet Take 1 tablet (5 mg total) by mouth every 6 (six) hours as needed for anxiety.  . [DISCONTINUED] sertraline (ZOLOFT) 50 MG tablet Take 50 mg by mouth daily.

## 2013-10-14 ENCOUNTER — Telehealth: Payer: Self-pay

## 2013-10-14 NOTE — Telephone Encounter (Signed)
Message copied by Velvet Bathe on Tue Oct 14, 2013  9:43 AM ------      Message from: Max Fickle B      Created: Mon Oct 13, 2013  5:37 PM       A            Please let her know that she doesn't have pneumonia.            Thanks      B ------

## 2013-10-14 NOTE — Telephone Encounter (Signed)
Patient aware of results, will continue with office recs.  Nothing further needed. Jah Alarid L

## 2013-11-12 ENCOUNTER — Ambulatory Visit: Payer: Medicaid Other | Admitting: Pulmonary Disease

## 2013-12-03 ENCOUNTER — Encounter: Payer: Self-pay | Admitting: Pulmonary Disease

## 2013-12-03 ENCOUNTER — Ambulatory Visit (INDEPENDENT_AMBULATORY_CARE_PROVIDER_SITE_OTHER): Payer: Medicaid Other | Admitting: Pulmonary Disease

## 2013-12-03 VITALS — BP 115/68 | HR 84 | Temp 98.8°F | Ht 66.0 in | Wt 128.0 lb

## 2013-12-03 DIAGNOSIS — J069 Acute upper respiratory infection, unspecified: Secondary | ICD-10-CM

## 2013-12-03 DIAGNOSIS — J9610 Chronic respiratory failure, unspecified whether with hypoxia or hypercapnia: Secondary | ICD-10-CM

## 2013-12-03 DIAGNOSIS — J449 Chronic obstructive pulmonary disease, unspecified: Secondary | ICD-10-CM

## 2013-12-03 NOTE — Assessment & Plan Note (Signed)
She does well at rest, but still needs her oxygen for exertion and at night as she still drops below 88% at rest.  She still uses and benefits from her oxygen daily.

## 2013-12-03 NOTE — Progress Notes (Signed)
Subjective:    Patient ID: Heidi [name], female    DOB: 03/16/1956, 58 y.o.   MRN: [id number]  Synopsis: Heidi [name] first saw Dr. Sherene Sires with the [facility] clinic in 2013. She has COPD and 8 2013 FEV1 was 0.72 L (27% predicted). She smoked heavily up until October of 2013.   HPI   08/05/2013 ROV >> Has been doing well since the lasst visit and [facility] and is now doing "forever fit" there now too.  Typically she will exercise for about 50 minutes to one hour of exercise twice a week.  She has not been able to tolerate the [place].  She says that all powder given her trouble.  She is using the nebulizer in the morning, only does about 1/2 of a treatment and the other 1/2 later in the day.  She rarely uses the proAir, at most twice a day, many days she never needs it.  No cough at call.  She had a flu shot last week and has not had a flare of COPD in many months now.  Her last hospitalization was October 2013.   10/13/2013 ROV >> Heidi [name] has been coughing more lately with some sputum production which is lime green in color.  She has had two weeks of symptoms because she thought she had bronchitis.  Apparently that doctor suspected pneumonia and prescribed an antibiotic (Levaquin) which she has been taking.  She has been more short of breath as well.  She has been using her oxygen more because of this.  She questions whether or not the levaquin is causing her to have tendon pain as she has been having some shoulder and elbow. She has been compliant with symbicort and had a pneumonia and flu shot this.    12/03/2013 ROV >  Heidi [name] has slowly improved since the last visit, but now she is getting some laryngitis and headache in the last few days.  She also has a UTI and is receiving treatment for that.  She continues to have a nagging, dry cough.  She is currently taking cipro for this.  She feels stopped up in her sinuses.   She has a headache. After the last visit she  started to feel better in terms of breathing and has not been wheezing.  She was feeling pretty good before the laryngitis hit.  Past Medical History  Diagnosis Date  . COPD (chronic obstructive pulmonary disease)   . NSTEMI (non-ST elevated myocardial infarction)     12/2011 with normal coronaries possibly secondary to Takotsubo (EF 30-35% by echo), occurred following two episodes of acute respiratory distress  . Hypotension     limiting med titration  . Takotsubo syndrome 4/13      Review of Systems  Constitutional: Negative for fever, chills and fatigue.  HENT: Positive for postnasal drip, rhinorrhea and sinus pressure. Negative for congestion.   Respiratory: Positive for cough. Negative for shortness of breath and wheezing.   Cardiovascular: Negative for chest pain, palpitations and leg swelling.  Neurological: Positive for headaches.       Objective:   Physical Exam  Filed Vitals:   12/03/13 1443  BP: 115/68  Pulse: 84  Temp: 98.8 F (37.1 C)  TempSrc: Oral  Height: 5\' 6"  (1.676 m)  Weight: 128 lb (58.06 kg)  SpO2: 96%  RA  Gen: well appearing, no acute distress HEENT: NCAT, EOMi, OP clear, PULM: Improved air movement today, no wheezing CV: RRR, no mgr,  no JVD AB: BS+, soft, nontender, no hsm Ext: warm, no edema, no clubbing, no cyanosis      Assessment & Plan:   URI She has a sinus infection today and is currently on Cipro for a UTI.  She has headache, laryngitis, cough, and sinus congestion but fortunately her lungs sound good today and she is not wheezing.  Plan: -continue cipro for UTI -supportive care> saline rinses, decongestants, drink plenty of fluids, NSAIDS prn  COPD GOLD III Fortunately today she is not in exacerbation and actually her oxygenation has improved despite the URI.  Plan -continue Symbicort and combivent -continue pulmonary rehab   Chronic respiratory failure She does well at rest, but still needs her oxygen for exertion and  at night as she still drops below 88% at rest.  She still uses and benefits from her oxygen daily.    Updated Medication List Outpatient Encounter Prescriptions as of 12/03/2013  Medication Sig  . albuterol (PROVENTIL) (2.5 MG/3ML) 0.083% nebulizer solution Take 2.5 mg by nebulization every 6 (six) hours as needed for wheezing.  . budesonide-formoterol (SYMBICORT) 160-4.5 MCG/ACT inhaler Inhale 2 puffs into the lungs 2 (two) times daily.  Marland Kitchen. escitalopram (LEXAPRO) 5 MG tablet Take 10 mg by mouth daily.   Marland Kitchen. ipratropium (ATROVENT) 0.02 % nebulizer solution INHALE ONE VIAL IN NEBULIZER EVERY 4 HOURS  . Ipratropium-Albuterol (COMBIVENT RESPIMAT) 20-100 MCG/ACT AERS respimat Inhale 1 puff into the lungs every 6 (six) hours as needed for wheezing.  . pantoprazole (PROTONIX) 40 MG tablet Take 1 tablet (40 mg total) by mouth daily. Take 30-60 min before first meal of the day  . ranitidine (ZANTAC) 150 MG tablet Take 150 mg by mouth daily as needed for heartburn.  . sodium chloride (OCEAN) 0.65 % SOLN nasal spray Place 1 spray into the nose as needed for congestion.  . [DISCONTINUED] predniSONE (DELTASONE) 10 MG tablet Take 40mg  po daily for 3 days, then take 30mg  po daily for 3 days, then take 20mg  po daily for two days, then take 10mg  po daily for 2 days

## 2013-12-03 NOTE — Assessment & Plan Note (Signed)
Fortunately today she is not in exacerbation and actually her oxygenation has improved despite the URI.  Plan -continue Symbicort and combivent -continue pulmonary rehab

## 2013-12-03 NOTE — Assessment & Plan Note (Signed)
She has a sinus infection today and is currently on Cipro for a UTI.  She has headache, laryngitis, cough, and sinus congestion but fortunately her lungs sound good today and she is not wheezing.  Plan: -continue cipro for UTI -supportive care> saline rinses, decongestants, drink plenty of fluids, NSAIDS prn

## 2013-12-03 NOTE — Patient Instructions (Signed)
Keep taking your sinuses: -keep taking your Cipro -use saline rinses twice a day (I recommend Lloyd Hugereil Med rinses)  -use chlorpheniramine-phenylephrine combination tablets as needed  Keep using your oxygen as you are doing Keep using your medicines as are you are doing  We will see you back in 3 months or sooner if needed

## 2014-02-11 ENCOUNTER — Ambulatory Visit: Payer: Self-pay | Admitting: Family Medicine

## 2014-03-03 ENCOUNTER — Encounter: Payer: Self-pay | Admitting: Pulmonary Disease

## 2014-03-03 ENCOUNTER — Ambulatory Visit (INDEPENDENT_AMBULATORY_CARE_PROVIDER_SITE_OTHER): Payer: Medicaid Other | Admitting: Pulmonary Disease

## 2014-03-03 VITALS — BP 126/58 | HR 89 | Ht 64.0 in | Wt 123.0 lb

## 2014-03-03 DIAGNOSIS — J4489 Other specified chronic obstructive pulmonary disease: Secondary | ICD-10-CM

## 2014-03-03 DIAGNOSIS — J309 Allergic rhinitis, unspecified: Secondary | ICD-10-CM | POA: Insufficient documentation

## 2014-03-03 DIAGNOSIS — J449 Chronic obstructive pulmonary disease, unspecified: Secondary | ICD-10-CM

## 2014-03-03 MED ORDER — FLUTICASONE PROPIONATE 50 MCG/ACT NA SUSP: 2.0000 | Freq: Every day | NASAL | Status: DC

## 2014-03-03 NOTE — Progress Notes (Signed)
  Subjective:    Patient ID: Heidi [name], female    DOB: [birth year], 58 y.o.   MRN: [id number]  Synopsis: Heidi [name] first saw Dr. Sherene SiresWert with the [facility] in 2013. She has COPD and 8 2013 FEV1 was 0.72 L (27% predicted). She smoked heavily up until October of 2013.   HPI   03/03/2014 ROV >> Heidi [name] has been having a lot of sinus congestion. Her breathing has been doing well.  The sinuses have been the biggest problem.  No cough outside of what is realted to the sinuses.  Still uses oxygen at night an on exertion.  She continues to take the symbicort.     Past Medical History  Diagnosis Date  . COPD (chronic obstructive pulmonary disease)   . NSTEMI (non-ST elevated myocardial infarction)     12/2011 with normal coronaries possibly secondary to Takotsubo (EF 30-35% by echo), occurred following two episodes of acute respiratory distress  . Hypotension     limiting med titration  . Takotsubo syndrome 4/13      Review of Systems  Constitutional: Negative for fever, chills and fatigue.  HENT: Positive for postnasal drip, rhinorrhea and sinus pressure. Negative for congestion.   Respiratory: Negative for cough, shortness of breath and wheezing.   Cardiovascular: Negative for chest pain, palpitations and leg swelling.       Objective:   Physical Exam  Filed Vitals:   03/03/14 1419  BP: 126/58  Pulse: 89  Height: 5\' 4"  (1.626 m)  Weight: 123 lb (55.792 kg)  SpO2: 96%  RA  Gen: well appearing, no acute distress HEENT: NCAT, EOMi, OP clear, PULM: Improved air movement today, no wheezing CV: RRR, no mgr, no JVD AB: BS+, soft, nontender, no hsm Ext: warm, no edema, no clubbing, no cyanosis      Assessment & Plan:   COPD GOLD III This is been a stable interval for Heidi [name].  Plan: -Continue Symbicort twice a day -Continue duo neb -Continue to stay active - Flu shot in the fall -Followup 6 months    Allergic rhinitis Zyrtec Flonase Saline  rinses    Updated Medication List Outpatient Encounter Prescriptions as of 03/03/2014  Medication Sig  . albuterol (PROVENTIL) (2.5 MG/3ML) 0.083% nebulizer solution Take 2.5 mg by nebulization every 6 (six) hours as needed for wheezing.  . budesonide-formoterol (SYMBICORT) 160-4.5 MCG/ACT inhaler Inhale 2 puffs into the lungs 2 (two) times daily.  Marland Kitchen. escitalopram (LEXAPRO) 5 MG tablet Take 10 mg by mouth daily.   Marland Kitchen. ipratropium (ATROVENT) 0.02 % nebulizer solution INHALE ONE VIAL IN NEBULIZER EVERY 4 HOURS  . Ipratropium-Albuterol (COMBIVENT RESPIMAT) 20-100 MCG/ACT AERS respimat Inhale 1 puff into the lungs every 6 (six) hours as needed for wheezing.  . pantoprazole (PROTONIX) 40 MG tablet Take 1 tablet (40 mg total) by mouth daily. Take 30-60 min before first meal of the day  . ranitidine (ZANTAC) 150 MG tablet Take 150 mg by mouth daily as needed for heartburn.  . sodium chloride (OCEAN) 0.65 % SOLN nasal spray Place 1 spray into the nose as needed for congestion.

## 2014-03-03 NOTE — Patient Instructions (Signed)
Take the flonase 2 sprays each nostril daily Take the generic zyrtec (cetirizine) daily Use saline sprays for your nose Keep using your oxygen and inhalers as you are doing  We will see you back in 6 months or sooner if needed

## 2014-03-03 NOTE — Assessment & Plan Note (Signed)
Zyrtec Flonase Saline rinses

## 2014-03-03 NOTE — Assessment & Plan Note (Addendum)
This is been a stable interval for [name].  Plan: -Continue Symbicort twice a day -Continue duo neb -Continue to stay active - Flu shot in the fall -Followup 6 months

## 2014-05-04 ENCOUNTER — Emergency Department: Payer: Self-pay | Admitting: Emergency Medicine

## 2014-05-04 LAB — CBC
HCT: 39.6 % (ref 35.0–47.0)
HGB: 13 g/dL (ref 12.0–16.0)
MCH: 31.9 pg (ref 26.0–34.0)
MCHC: 32.8 g/dL (ref 32.0–36.0)
MCV: 97 fL (ref 80–100)
Platelet: 260 10*3/uL (ref 150–440)
RBC: 4.07 10*6/uL (ref 3.80–5.20)
RDW: 14.7 % — AB (ref 11.5–14.5)
WBC: 6.2 10*3/uL (ref 3.6–11.0)

## 2014-05-04 LAB — COMPREHENSIVE METABOLIC PANEL
ALBUMIN: 3.4 g/dL (ref 3.4–5.0)
ANION GAP: 5 — AB (ref 7–16)
Alkaline Phosphatase: 129 U/L — ABNORMAL HIGH
BUN: 7 mg/dL (ref 7–18)
Bilirubin,Total: 0.4 mg/dL (ref 0.2–1.0)
CALCIUM: 9.2 mg/dL (ref 8.5–10.1)
CO2: 29 mmol/L (ref 21–32)
Chloride: 104 mmol/L (ref 98–107)
Creatinine: 0.8 mg/dL (ref 0.60–1.30)
EGFR (African American): >60
EGFR (Non-African Amer.): >60
Glucose: 116 mg/dL — ABNORMAL HIGH (ref 65–99)
Osmolality: 275 (ref 275–301)
Potassium: 4.2 mmol/L (ref 3.5–5.1)
SGOT(AST): 38 U/L — ABNORMAL HIGH (ref 15–37)
SGPT (ALT): 29 U/L (ref 12–78)
Sodium: 138 mmol/L (ref 136–145)
Total Protein: 7.3 g/dL (ref 6.4–8.2)

## 2014-05-04 LAB — TROPONIN I
Troponin-I: <0.02 ng/mL
Troponin-I: <0.02 ng/mL

## 2014-07-14 ENCOUNTER — Ambulatory Visit (INDEPENDENT_AMBULATORY_CARE_PROVIDER_SITE_OTHER): Payer: Medicare Other | Admitting: Pulmonary Disease

## 2014-07-14 ENCOUNTER — Encounter: Payer: Self-pay | Admitting: Pulmonary Disease

## 2014-07-14 VITALS — BP 124/66 | HR 80 | Ht 64.0 in | Wt 117.0 lb

## 2014-07-14 DIAGNOSIS — J449 Chronic obstructive pulmonary disease, unspecified: Secondary | ICD-10-CM

## 2014-07-14 DIAGNOSIS — Z720 Tobacco use: Secondary | ICD-10-CM

## 2014-07-14 DIAGNOSIS — J9611 Chronic respiratory failure with hypoxia: Secondary | ICD-10-CM

## 2014-07-14 DIAGNOSIS — R0902 Hypoxemia: Secondary | ICD-10-CM

## 2014-07-14 DIAGNOSIS — F17200 Nicotine dependence, unspecified, uncomplicated: Secondary | ICD-10-CM

## 2014-07-14 DIAGNOSIS — J9610 Chronic respiratory failure, unspecified whether with hypoxia or hypercapnia: Secondary | ICD-10-CM

## 2014-07-14 DIAGNOSIS — Z87891 Personal history of nicotine dependence: Secondary | ICD-10-CM

## 2014-07-14 MED ORDER — BUDESONIDE-FORMOTEROL FUMARATE 160-4.5 MCG/ACT IN AERO: 2.0000 | INHALATION_SPRAY | Freq: Two times a day (BID) | RESPIRATORY_TRACT | Status: DC

## 2014-07-14 MED ORDER — IPRATROPIUM-ALBUTEROL 20-100 MCG/ACT IN AERS: 1.0000 | INHALATION_SPRAY | Freq: Four times a day (QID) | RESPIRATORY_TRACT | Status: DC | PRN

## 2014-07-14 MED ORDER — ALBUTEROL SULFATE (2.5 MG/3ML) 0.083% IN NEBU: 2.5000 mg | INHALATION_SOLUTION | Freq: Four times a day (QID) | RESPIRATORY_TRACT | Status: DC | PRN

## 2014-07-14 NOTE — Assessment & Plan Note (Signed)
She has a 30-pack-year smoking history and quit in 2014. Today we discussed the risks and benefits of low-dose cancer screening with a CT scan. I explained to her that the risks include false positive as high as 25%. This may include unnecessary biopsies or other imaging tests. She understands these risks and is willing to proceed.

## 2014-07-14 NOTE — Assessment & Plan Note (Signed)
She has severe COPD but this has been a stable interval for her.  Plan: -Continue Symbicort and Combivent -Get a flu shot -Continue regular exercise -Followup 6 months

## 2014-07-14 NOTE — Patient Instructions (Signed)
Keep taking the symbicort as you are doing Keep exercising regularly We will arrange a CT scan of your lungs as a part of lung cancer screening We will see you back in 6 months or sooner if needed

## 2014-07-14 NOTE — Progress Notes (Signed)
Subjective:    Patient ID: Heidi [name], female    DOB: 07/21/1956, 58 y.o.   MRN: [id number]  Synopsis: Heidi [name] first saw Dr. Sherene Sires with the [facility] clinic in 2013. She has COPD and 8 2013 FEV1 was 0.72 L (27% predicted). She smoked heavily up until October of 2013.   HPI   07/14/14 ROV > [name] says her sinuses have been OK on the nasacort.  She is working out with pulmonary rehab and has actually been told that her oxygen level is OK on room air with exercise.  She has not been cougihng much.  She has been using the symbicort regularly and it is working well.  She had a cold a month ago for which she took robitussin and that helped clear things up.  She had a flu shot last week.     Past Medical History  Diagnosis Date  . COPD (chronic obstructive pulmonary disease)   . NSTEMI (non-ST elevated myocardial infarction)     12/2011 with normal coronaries possibly secondary to Takotsubo (EF 30-35% by echo), occurred following two episodes of acute respiratory distress  . Hypotension     limiting med titration  . Takotsubo syndrome 4/13      Review of Systems  Constitutional: Negative for fever, chills and fatigue.  HENT: Positive for postnasal drip, rhinorrhea and sinus pressure. Negative for congestion.   Respiratory: Negative for cough, shortness of breath and wheezing.   Cardiovascular: Negative for chest pain, palpitations and leg swelling.       Objective:   Physical Exam  Filed Vitals:   07/14/14 0945  BP: 124/66  Pulse: 80  Height:  (1.626 m)  Weight: 117 lb (53.071 kg)  SpO2: 99%  RA  Gen: well appearing, no acute distress HEENT: NCAT, EOMi, OP clear, PULM: Improved air movement today, no wheezing CV: RRR, no mgr, no JVD AB: BS+, soft, nontender, no hsm Ext: warm, no edema, no clubbing, no cyanosis      Assessment & Plan:   Chronic respiratory failure Her prescription is for 3L with exertion and qHS. However I walked her 500 feet  today on room air and her oxygenation remained above 95%.  COPD GOLD III She has severe COPD but this has been a stable interval for her.  Plan: -Continue Symbicort and Combivent -Get a flu shot -Continue regular exercise -Followup 6 months  Ex-smoker She has a 30-pack-year smoking history and quit in 2014. Today we discussed the risks and benefits of low-dose cancer screening with a CT scan. I explained to her that the risks include false positive as high as 25%. This may include unnecessary biopsies or other imaging tests. She understands these risks and is willing to proceed.    Updated Medication List Outpatient Encounter Prescriptions as of 07/14/2014  Medication Sig  . albuterol (PROVENTIL) (2.5 MG/3ML) 0.083% nebulizer solution Take 3 mLs (2.5 mg total) by nebulization every 6 (six) hours as needed for wheezing.  . budesonide-formoterol (SYMBICORT) 160-4.5 MCG/ACT inhaler Inhale 2 puffs into the lungs 2 (two) times daily.  Marland Kitchen escitalopram (LEXAPRO) 5 MG tablet Take 20 mg by mouth daily.   . fluticasone (FLONASE) 50 MCG/ACT nasal spray Place 2 sprays into both nostrils daily.  Marland Kitchen ipratropium (ATROVENT) 0.02 % nebulizer solution INHALE ONE VIAL IN NEBULIZER EVERY 4 HOURS  . Ipratropium-Albuterol (COMBIVENT RESPIMAT) 20-100 MCG/ACT AERS respimat Inhale 1 puff into the lungs every 6 (six) hours as needed for wheezing.  . pantoprazole (PROTONIX)  40 MG tablet Take 40 mg by mouth 2 (two) times daily. Take 30-60 min before first meal of the day  . ranitidine (ZANTAC) 150 MG tablet Take 150 mg by mouth daily as needed for heartburn.  . sodium chloride (OCEAN) 0.65 % SOLN nasal spray Place 1 spray into the nose as needed for congestion.  . [DISCONTINUED] albuterol (PROVENTIL) (2.5 MG/3ML) 0.083% nebulizer solution Take 2.5 mg by nebulization every 6 (six) hours as needed for wheezing.  . [DISCONTINUED] budesonide-formoterol (SYMBICORT) 160-4.5 MCG/ACT inhaler Inhale 2 puffs into the lungs 2  (two) times daily.  . [DISCONTINUED] Ipratropium-Albuterol (COMBIVENT RESPIMAT) 20-100 MCG/ACT AERS respimat Inhale 1 puff into the lungs every 6 (six) hours as needed for wheezing.  . [DISCONTINUED] pantoprazole (PROTONIX) 40 MG tablet Take 1 tablet (40 mg total) by mouth daily. Take 30-60 min before first meal of the day

## 2014-07-14 NOTE — Progress Notes (Deleted)
Subjective:    Patient ID: Heidi [name], female    DOB: Dec 20, 1955, 58 y.o.   MRN: [id number]  HPI    Review of Systems     Objective:   Physical Exam        Assessment & Plan:    Subjective:    Patient ID: Heidi [name], female    DOB: 1956/07/31, 58 y.o.   MRN: [id number]  Synopsis: Erielle Gawronski first saw Dr. Sherene Sires with the [facility] clinic in 2013. She has COPD and 8 2013 FEV1 was 0.72 L (27% predicted). She smoked heavily up until October of 2013.  O2 : Uses 3 LPM with exercise and qHS  HPI   07/14/14 ROV > Eiko says her sinuses have been OK on the nasacort.  She is working out with pulmonary rehab and has actually been told that her oxygen level is OK on room air with exercise.  She has not been cougihng much.  She has been using the symbicort regularly and it is working well.  She had a cold a month ago for which she took robitussin and that helped clear things up.  She had a flu shot last week.    Past Medical History  Diagnosis Date  . COPD (chronic obstructive pulmonary disease)   . NSTEMI (non-ST elevated myocardial infarction)     12/2011 with normal coronaries possibly secondary to Takotsubo (EF 30-35% by echo), occurred following two episodes of acute respiratory distress  . Hypotension     limiting med titration  . Takotsubo syndrome 4/13      Review of Systems  Constitutional: Negative for fever, chills and fatigue.  HENT: Positive for postnasal drip, rhinorrhea and sinus pressure. Negative for congestion.   Respiratory: Negative for cough, shortness of breath and wheezing.   Cardiovascular: Negative for chest pain, palpitations and leg swelling.       Objective:   Physical Exam  Filed Vitals:   07/14/14 0945  BP: 124/66  Pulse: 80  Height:  (1.626 m)  Weight: 117 lb (53.071 kg)  SpO2: 99%  RA  Gen: well appearing, no acute distress HEENT: NCAT, EOMi, OP clear, PULM: Improved air movement today, no wheezing CV:  RRR, no mgr, no JVD AB: BS+, soft, nontender, no hsm Ext: warm, no edema, no clubbing, no cyanosis      Assessment & Plan:   No problem-specific assessment & plan notes found for this encounter.   Updated Medication List Outpatient Encounter Prescriptions as of 07/14/2014  Medication Sig  . albuterol (PROVENTIL) (2.5 MG/3ML) 0.083% nebulizer solution Take 2.5 mg by nebulization every 6 (six) hours as needed for wheezing.  . budesonide-formoterol (SYMBICORT) 160-4.5 MCG/ACT inhaler Inhale 2 puffs into the lungs 2 (two) times daily.  Marland Kitchen escitalopram (LEXAPRO) 5 MG tablet Take 20 mg by mouth daily.   . fluticasone (FLONASE) 50 MCG/ACT nasal spray Place 2 sprays into both nostrils daily.  Marland Kitchen ipratropium (ATROVENT) 0.02 % nebulizer solution INHALE ONE VIAL IN NEBULIZER EVERY 4 HOURS  . Ipratropium-Albuterol (COMBIVENT RESPIMAT) 20-100 MCG/ACT AERS respimat Inhale 1 puff into the lungs every 6 (six) hours as needed for wheezing.  . pantoprazole (PROTONIX) 40 MG tablet Take 40 mg by mouth 2 (two) times daily. Take 30-60 min before first meal of the day  . ranitidine (ZANTAC) 150 MG tablet Take 150 mg by mouth daily as needed for heartburn.  . sodium chloride (OCEAN) 0.65 % SOLN nasal spray Place 1 spray into the nose  as needed for congestion.  . [DISCONTINUED] pantoprazole (PROTONIX) 40 MG tablet Take 1 tablet (40 mg total) by mouth daily. Take 30-60 min before first meal of the day

## 2014-07-14 NOTE — Assessment & Plan Note (Signed)
Her prescription is for 3L with exertion and qHS. However I walked her 500 feet today on room air and her oxygenation remained above 95%.

## 2014-07-31 ENCOUNTER — Ambulatory Visit: Payer: Self-pay | Admitting: Family Medicine

## 2014-08-19 ENCOUNTER — Telehealth: Payer: Self-pay | Admitting: Pulmonary Disease

## 2014-08-19 DIAGNOSIS — J9611 Chronic respiratory failure with hypoxia: Secondary | ICD-10-CM

## 2014-08-19 NOTE — Telephone Encounter (Signed)
Called and spoke to pt. Pt [facility], pt was concerned. Called and spoke to Bemus Pointarla with [facility]. Albin FellingCarla stated an order was placed on 08/18/14 to d/c O2. Called and informed pt of the order to d/c daytime O2. Pt seemed very concerned and wants to keep her portable tank for an as needed basis. Pt requesting BQ recs if she can keep the portable. Pt last seen on 07/14/14 by BQ.   BQ please advise.

## 2014-08-21 NOTE — Telephone Encounter (Signed)
The last time I walked her in the office she didn't need it. Have another company do a home eval for oxygen

## 2014-08-21 NOTE — Telephone Encounter (Signed)
I spoke with the pt and notified of recs per BQ  She verbalized understanding  [facility]

## 2014-08-25 ENCOUNTER — Telehealth: Payer: Self-pay | Admitting: Pulmonary Disease

## 2014-08-25 NOTE — Telephone Encounter (Signed)
No need for message. °

## 2014-09-15 ENCOUNTER — Ambulatory Visit: Payer: Medicare Other | Admitting: Pulmonary Disease

## 2014-10-08 ENCOUNTER — Encounter (HOSPITAL_COMMUNITY): Payer: Self-pay | Admitting: Internal Medicine

## 2014-11-25 ENCOUNTER — Other Ambulatory Visit: Payer: Self-pay

## 2014-11-25 MED ORDER — FLUTICASONE PROPIONATE 50 MCG/ACT NA SUSP: 2.0000 | Freq: Every day | NASAL | Status: DC

## 2014-11-28 ENCOUNTER — Inpatient Hospital Stay: Payer: Self-pay | Admitting: Internal Medicine

## 2014-11-28 LAB — COMPREHENSIVE METABOLIC PANEL
AST: 42 U/L — AB (ref 15–37)
Albumin: 3.5 g/dL (ref 3.4–5.0)
Alkaline Phosphatase: 128 U/L — ABNORMAL HIGH (ref 46–116)
Anion Gap: 5 — ABNORMAL LOW (ref 7–16)
BILIRUBIN TOTAL: 0.3 mg/dL (ref 0.2–1.0)
BUN: 3 mg/dL — ABNORMAL LOW (ref 7–18)
CHLORIDE: 106 mmol/L (ref 98–107)
CO2: 28 mmol/L (ref 21–32)
CREATININE: 0.72 mg/dL (ref 0.60–1.30)
Calcium, Total: 8.9 mg/dL (ref 8.5–10.1)
EGFR (Non-African Amer.): >60
Glucose: 85 mg/dL (ref 65–99)
Osmolality: 273 (ref 275–301)
Potassium: 4.6 mmol/L (ref 3.5–5.1)
SGPT (ALT): 24 U/L (ref 14–63)
Sodium: 139 mmol/L (ref 136–145)
Total Protein: 7.9 g/dL (ref 6.4–8.2)

## 2014-11-28 LAB — CBC
HCT: 40.4 % (ref 35.0–47.0)
HGB: 13.6 g/dL (ref 12.0–16.0)
MCH: 33.2 pg (ref 26.0–34.0)
MCHC: 33.6 g/dL (ref 32.0–36.0)
MCV: 99 fL (ref 80–100)
Platelet: 292 10*3/uL (ref 150–440)
RBC: 4.09 10*6/uL (ref 3.80–5.20)
RDW: 12.9 % (ref 11.5–14.5)
WBC: 8 10*3/uL (ref 3.6–11.0)

## 2014-11-28 LAB — TROPONIN I

## 2014-11-29 LAB — BASIC METABOLIC PANEL
Anion Gap: 7 (ref 7–16)
BUN: 6 mg/dL — ABNORMAL LOW (ref 7–18)
Calcium, Total: 9.1 mg/dL (ref 8.5–10.1)
Chloride: 105 mmol/L (ref 98–107)
Co2: 27 mmol/L (ref 21–32)
Creatinine: 0.93 mg/dL (ref 0.60–1.30)
EGFR (African American): >60
EGFR (Non-African Amer.): >60
Glucose: 144 mg/dL — ABNORMAL HIGH (ref 65–99)
Osmolality: 278 (ref 275–301)
Potassium: 4 mmol/L (ref 3.5–5.1)
Sodium: 139 mmol/L (ref 136–145)

## 2014-11-29 LAB — CBC WITH DIFFERENTIAL/PLATELET
BASOS ABS: 0 10*3/uL (ref 0.0–0.1)
Basophil %: 0.1 %
Eosinophil #: 0 10*3/uL (ref 0.0–0.7)
Eosinophil %: 0 %
HCT: 37.6 % (ref 35.0–47.0)
HGB: 12.4 g/dL (ref 12.0–16.0)
LYMPHS PCT: 13.2 %
Lymphocyte #: 0.8 10*3/uL — ABNORMAL LOW (ref 1.0–3.6)
MCH: 32.8 pg (ref 26.0–34.0)
MCHC: 33 g/dL (ref 32.0–36.0)
MCV: 99 fL (ref 80–100)
MONOS PCT: 1.8 %
Monocyte #: 0.1 x10 3/mm — ABNORMAL LOW (ref 0.2–0.9)
NEUTROS PCT: 84.9 %
Neutrophil #: 5.5 10*3/uL (ref 1.4–6.5)
PLATELETS: 293 10*3/uL (ref 150–440)
RBC: 3.78 10*6/uL — ABNORMAL LOW (ref 3.80–5.20)
RDW: 13.1 % (ref 11.5–14.5)
WBC: 6.4 10*3/uL (ref 3.6–11.0)

## 2014-12-03 LAB — CULTURE, BLOOD (SINGLE)

## 2015-01-05 ENCOUNTER — Encounter: Payer: Self-pay | Admitting: Pulmonary Disease

## 2015-01-05 ENCOUNTER — Ambulatory Visit (INDEPENDENT_AMBULATORY_CARE_PROVIDER_SITE_OTHER)
Admission: RE | Admit: 2015-01-05 | Discharge: 2015-01-05 | Disposition: A | Discharge status: Home / Self Care | Payer: Medicare Other | Source: Ambulatory Visit | Attending: Pulmonary Disease | Admitting: Pulmonary Disease

## 2015-01-05 ENCOUNTER — Ambulatory Visit (INDEPENDENT_AMBULATORY_CARE_PROVIDER_SITE_OTHER): Payer: Medicare Other | Admitting: Pulmonary Disease

## 2015-01-05 VITALS — BP 130/76 | HR 94 | Ht 64.0 in | Wt 120.0 lb

## 2015-01-05 DIAGNOSIS — J441 Chronic obstructive pulmonary disease with (acute) exacerbation: Secondary | ICD-10-CM | POA: Diagnosis not present

## 2015-01-05 DIAGNOSIS — J189 Pneumonia, unspecified organism: Secondary | ICD-10-CM

## 2015-01-05 DIAGNOSIS — J9611 Chronic respiratory failure with hypoxia: Secondary | ICD-10-CM | POA: Diagnosis not present

## 2015-01-05 MED ORDER — IPRATROPIUM-ALBUTEROL 0.5-2.5 (3) MG/3ML IN SOLN: 3.0000 mL | Freq: Four times a day (QID) | RESPIRATORY_TRACT | Status: DC | PRN

## 2015-01-05 NOTE — Assessment & Plan Note (Signed)
She remains off of oxygen.

## 2015-01-05 NOTE — Patient Instructions (Signed)
We will call you with the results of the Chest X-ray  Use the Spiriva 2 puffs daily two weeks with the Symbicort and let us know if it helps.  If it does we will send a prescription for you.  We will see you back in 4-6 months or sooner if needed

## 2015-01-05 NOTE — Assessment & Plan Note (Signed)
Aside from the pneumonia, this has been a stable interval.  She says that her breathing is back to baseline but she limits herself somewhat due to dyspnea.  She has severe COPD and it is conceivable that she could benefit from the Spiriva and Symbicort.  Plan: -Use this sample of Spiriva provided by the hospital in addition to Symbicort -I advised that she should limit Combivent use while using the Spiriva -If she receives benefit from the Spiriva and we will send a prescription and change her Combivent to albuterol -Continue as needed Combivent and/or duo neb use as detailed above

## 2015-01-05 NOTE — Assessment & Plan Note (Signed)
She had community-acquired pneumonia treated at [facility] in February 2016. She appears to have made a good clinical response in her lungs are clear on exam today. I have reviewed the records from the discharge summary provided today in clinic.  Plan: -Repeat chest x-ray today to ensure radiographic resolution of pneumonia.

## 2015-01-05 NOTE — Progress Notes (Signed)
Subjective:    Patient ID: Heidi [name], female    DOB: 22-Jul-1956, 59 y.o.   MRN: [id number]  Synopsis: [name] first saw Dr. Sherene Sires with the [facility] clinic in 2013. She has COPD and 8 2013 FEV1 was 0.72 L (27% predicted). She smoked heavily up until October of 2013.   HPI  Chief Complaint  Patient presents with  . Follow-up    Pt hospitalized last month at [facility] for PNA.  Pt c/o some sob with exertion, seldom nonprod cough.      [name] says that she was hospitalized for pneumona back in February at [facility] for four days.  She had right lower lobe pneumonia and she has been feeling really well since then.  She never needed oxygen during that time.  She feels like her breathing is doing OK now but she hasn't been pushing herself too much.  She says that she is limiting her dyspnea throughout the day because of dyspnea and anxiety.  She was discahrged on Spiriva in addition to Respimat  Spiriva.  She took handihaler Spiriva because it caused a reaction in her mouth.    Past Medical History  Diagnosis Date  . COPD (chronic obstructive pulmonary disease)   . NSTEMI (non-ST elevated myocardial infarction)     12/2011 with normal coronaries possibly secondary to Takotsubo (EF 30-35% by echo), occurred following two episodes of acute respiratory distress  . Hypotension     limiting med titration  . Takotsubo syndrome 4/13      Review of Systems  Constitutional: Negative for fever, chills and fatigue.  HENT: Negative for congestion, postnasal drip, rhinorrhea and sinus pressure.   Respiratory: Positive for shortness of breath. Negative for cough and wheezing.   Cardiovascular: Negative for chest pain, palpitations and leg swelling.       Objective:   Physical Exam Filed Vitals:   01/05/15 1430  BP: 130/76  Pulse: 94  Height:  (1.626 m)  Weight: 120 lb (54.432 kg)  SpO2: 93%  RA  Gen: well appearing, no acute distress HEENT: NCAT, EOMi, OP clear, PULM:  CTA B today CV: RRR, no mgr, no JVD AB: BS+, soft, nontender, no hsm Ext: warm, no edema, no clubbing, no cyanosis      Assessment & Plan:   Chronic respiratory failure She remains off of oxygen.   COPD GOLD III Aside from the pneumonia, this has been a stable interval.  She says that her breathing is back to baseline but she limits herself somewhat due to dyspnea.  She has severe COPD and it is conceivable that she could benefit from the Spiriva and Symbicort.  Plan: -Use this sample of Spiriva provided by the hospital in addition to Symbicort -I advised that she should limit Combivent use while using the Spiriva -If she receives benefit from the Spiriva and we will send a prescription and change her Combivent to albuterol -Continue as needed Combivent and/or duo neb use as detailed above    CAP (community acquired pneumonia) She had community-acquired pneumonia treated at [facility] in February 2016. She appears to have made a good clinical response in her lungs are clear on exam today. I have reviewed the records from the discharge summary provided today in clinic.  Plan: -Repeat chest x-ray today to ensure radiographic resolution of pneumonia.     Updated Medication List Outpatient Encounter Prescriptions as of 01/05/2015  Medication Sig  . budesonide-formoterol (SYMBICORT) 160-4.5 MCG/ACT inhaler Inhale 2 puffs into the lungs  2 (two) times daily.  . diazepam (VALIUM) 5 MG tablet Take 2.5 mg by mouth every 6 (six) hours as needed for anxiety.  Marland Kitchen. escitalopram (LEXAPRO) 5 MG tablet Take 20 mg by mouth daily.   . fluticasone (FLONASE) 50 MCG/ACT nasal spray Place 2 sprays into both nostrils daily.  . Ipratropium-Albuterol (COMBIVENT RESPIMAT) 20-100 MCG/ACT AERS respimat Inhale 1 puff into the lungs every 6 (six) hours as needed for wheezing.  . pantoprazole (PROTONIX) 40 MG tablet Take 40 mg by mouth 2 (two) times daily. Take 30-60 min before first meal of the day  .  ranitidine (ZANTAC) 150 MG tablet Take 150 mg by mouth daily as needed for heartburn.  . sodium chloride (OCEAN) 0.65 % SOLN nasal spray Place 1 spray into the nose as needed for congestion.  . [DISCONTINUED] albuterol (PROVENTIL) (2.5 MG/3ML) 0.083% nebulizer solution Take 3 mLs (2.5 mg total) by nebulization every 6 (six) hours as needed for wheezing.  . [DISCONTINUED] ipratropium (ATROVENT) 0.02 % nebulizer solution INHALE ONE VIAL IN NEBULIZER EVERY 4 HOURS  . ipratropium-albuterol (DUONEB) 0.5-2.5 (3) MG/3ML SOLN Take 3 mLs by nebulization every 6 (six) hours as needed.

## 2015-01-06 ENCOUNTER — Telehealth: Payer: Self-pay | Admitting: Pulmonary Disease

## 2015-01-06 MED ORDER — IPRATROPIUM-ALBUTEROL 20-100 MCG/ACT IN AERS: 1.0000 | INHALATION_SPRAY | Freq: Four times a day (QID) | RESPIRATORY_TRACT | Status: DC | PRN

## 2015-01-06 NOTE — Telephone Encounter (Signed)
Notes Recorded by Lupita Leashouglas B McQuaid, MD on 01/05/2015 at 5:38 PM A, Please let her know that this was OK Thanks B  -------------  Pt aware of results.  ntohing further needed.

## 2015-01-06 NOTE — Progress Notes (Signed)
Quick Note:  lmtcb X1 for pt. ______ 

## 2015-01-11 ENCOUNTER — Telehealth: Payer: Self-pay | Admitting: Pulmonary Disease

## 2015-01-11 NOTE — Telephone Encounter (Signed)
Pt aware of cxr results  

## 2015-02-11 ENCOUNTER — Telehealth: Payer: Self-pay | Admitting: Pulmonary Disease

## 2015-02-11 MED ORDER — ALBUTEROL SULFATE (2.5 MG/3ML) 0.083% IN NEBU: 2.5000 mg | INHALATION_SOLUTION | Freq: Four times a day (QID) | RESPIRATORY_TRACT | Status: DC | PRN

## 2015-02-11 MED ORDER — BUDESONIDE-FORMOTEROL FUMARATE 160-4.5 MCG/ACT IN AERO: 2.0000 | INHALATION_SPRAY | Freq: Two times a day (BID) | RESPIRATORY_TRACT | Status: DC

## 2015-02-11 MED ORDER — IPRATROPIUM BROMIDE 0.02 % IN SOLN: 0.5000 mg | Freq: Four times a day (QID) | RESPIRATORY_TRACT | Status: DC | PRN

## 2015-02-11 NOTE — Telephone Encounter (Signed)
Called spoke with pt. She reports her insurance will not pay for duoneb. She needs these RX's sent in separately. Per Morrie SheldonAshley C this is fine to do so. Nothing further needed

## 2015-02-16 NOTE — Consult Note (Signed)
General Aspect Pt is a middle age female with hx of severe COPD, hx of Takutsubo cardiomyopathy ( stress induced NSTEMI).  her last EF was 55-60a% by echo 05/14/12.  [facility] with COPD exacerbation.  [facility] with severe dysp[facility] and ? of panic attack.  she denies any chest pain    Present Illness She has not had any chest pain - just dyspnea.  she takes all of her medications.   Physical Exam:   GEN well developed, thin    HEENT moist oral mucosa    NECK No masses    RESP wheezing  wheezing with cough and forced expiratioin    CARD Regular rate and rhythm    ABD denies tenderness  no liver/spleen enlargement  soft  no Abdominal Bruits    LYMPH negative neck    EXTR negative edema    NEURO cranial nerves intact    PSYCH A+O to time, place, person, anxious   Review of Systems:   Subjective/Chief Complaint Chronic dyspnea    Respiratory: Frequent cough     COPD:    Intubation:   Home Medications: Medication Instructions Status  Symbicort 160 mcg-4.5 mcg/inh inhalation aerosol 2 puff(s) inhaled 2 times a day Active  Atrovent HFA 17 mcg/inh inhalation aerosol 2 puff(s) inhaled 4 times a day Active  Combivent Respimat CFC free 20 mcg-100 mcg/inh inhalation aerosol 1 puff(s) inhaled 4 times a day Active  ProAir HFA 90 mcg/inh inhalation aerosol 2 puff(s) inhaled 4 times a day Active  predniSONE 10 mg oral tablet 1 tab(s) orally once a day Active    Codeine: N/V/Diarrhea  Vital Signs/Nurse's Notes: **Vital Signs.:   30-Oct-13 05:20   Vital Signs Type Admission   Pulse Pulse 86   Pulse source if not from Vital Sign Device per cardiac monitor   Respirations Respirations 23   Systolic BP Systolic BP 105   Diastolic BP (mmHg) Diastolic BP (mmHg) 67   Mean BP 79   BP Source  if not from Vital Sign Device non-invasive   Pulse Ox % Pulse Ox % 100   Pulse Ox Activity Level  At rest   Oxygen Delivery 2L; Nasal Cannula    Pulse Ox Heart Rate 86    06:00   Vital Signs Type Routine   Pulse Pulse 78   Respirations Respirations 21   Systolic BP Systolic BP 108   Diastolic BP (mmHg) Diastolic BP (mmHg) 68   Mean BP 81   Pulse Ox % Pulse Ox % 99   Pulse Ox Activity Level  At rest   Oxygen Delivery 2L; Nasal Cannula   Pulse Ox Heart Rate 78    07:00   Vital Signs Type Routine   Pulse Pulse 80   Pulse source if not from Vital Sign Device per cardiac monitor   Respirations Respirations 16   Systolic BP Systolic BP 102   Diastolic BP (mmHg) Diastolic BP (mmHg) 58   Mean BP 72   Pulse Ox % Pulse Ox % 100   Pulse Ox Activity Level  At rest   Oxygen Delivery 2L; Nasal Cannula   Pulse Ox Heart Rate 80    08:00   Vital Signs Type Routine   Temperature Temperature (F) 97.7   Celsius 36.5   Temperature Source oral   Pulse Pulse 84   Pulse source if not from Vital Sign Device per cardiac monitor   Respirations Respirations  29   Systolic BP Systolic BP 107   Diastolic BP (mmHg) Diastolic BP (mmHg) 69   Mean BP 81   Pulse Ox % Pulse Ox % 100   Pulse Ox Activity Level  At rest   Oxygen Delivery 2L; Nasal Cannula   Pulse Ox Heart Rate 84     Impression Pt has a hx of Takotsubo syndrome.  she had a cath revealiing no significant coronary artery disease in March, 2013.  She was treated with Losartan 25 mg and her follow up echo on July revealed normal LV EF of 55-60%.  She was hospitalized in early Oct. for COPD exacerbation.  Her Losartan was stopped at some point because of hypotension  Plan:  agree with getting 1 more set of cardiac enzymes.  The initial cardiac enzymes are only minimally elevated and are not concerning.  agree with repeating echo to review her LV function.  I would restart her Losartan and watch.  if she develops more hypotension, I would discontinue but this may help her given her hx of cardiomyopathy.  Metoprolol would also help prevent further episodes of Takotsubo but we will have to see  if she tolerates it from a pulmonary standpoint.  She can be moved to tele.  following   Electronic Signatures: Kymia Simi, Antony Blackbird (MD)  (Signed 30-Oct-13 09:19)  Authored: General Aspect/Present Illness, History and Physical Exam, Review of System, Past Medical History, Home Medications, Allergies, Vital Signs/Nurse's Notes, Impression/Plan   Last Updated: 30-Oct-13 09:19 by Obdulio Mash, Antony Blackbird (MD)

## 2015-02-16 NOTE — Discharge Summary (Signed)
PATIENT NAME:  Heidi [name], Heidi [name] DATE OF BIRTH:  [birth year]  DATE OF ADMISSION:  08/28/2012 DATE OF DISCHARGE:  08/29/2012  For a detailed note, please take a look at the history and physical done on admission by Dr. Amado CoeGouru.    DIAGNOSES AT DISCHARGE:  1. Shortness of breath and chest pain, likely related to chronic obstructive pulmonary disease/anxiety.  2. Elevated troponin likely in the setting of demand ischemia from chronic obstructive pulmonary disease and hypoxemia. 3. Chronic obstructive pulmonary disease, oxygen dependent. 4. Anxiety.  5. History of Takotsubo cardiomyopathy.   DIET: Patient is being discharged on a low-sodium diet.   ACTIVITY: As tolerated.   FOLLOW UP: Follow up with Dr. Kirke CorinArida in the next 1 to 2 weeks.  DISCHARGE MEDICATIONS:   1. Symbicort 2 puffs b.i.d.  2. Atrovent inhaler 2 puffs q.i.d.  3. Combivent Respimat 1 puff 4 times daily. 4. Albuterol inhaler 2 puffs 4 times daily as needed.  5. Prednisone taper starting at 60 mg down to 10 mg over the next six days. 6. Losartan 25 mg daily.  7. Xanax 0.25 mg t.i.d. as needed for anxiety.   CONSULTANTS DURING THE HOSPITAL COURSE: Dr. Leodis SiasPhillip Nahser and Dr. Lorine BearsMuhammad Arida from cardiology.   LABORATORY, DIAGNOSTIC AND RADIOLOGICAL DATA: Pertinent studies done during the hospital course: Chest x-ray done on admission showing chronic obstructive pulmonary disease without evidence of acute cardiopulmonary disease.   BRIEF HOSPITAL COURSE: This is a 59 year old female who presented to the hospital initially secondary to shortness of breath and chest pain.  1. Shortness of breath and chest pain. Most likely cause of patient's symptoms were related to underlying chronic obstructive pulmonary disease combined with anxiety. She did have a mildly elevated troponin but as per cardiology was probably related to demand ischemia, unlikely acute coronary syndrome. She underwent a two-dimensional echocardiogram  which showed normal ejection fraction with no evidence of any acute valvular abnormalities. She will continue follow up with her cardiologist, Dr. Jens Somrenshaw, an outpatient.  2. Chronic obstructive pulmonary disease. Patient has severe chronic obstructive pulmonary disease, is already oxygen dependent. She was maintained on her inhalers including Combivent and albuterol, she will resume that along with a prednisone taper as stated. She was given some IV steroids with some mild chronic obstructive pulmonary disease exacerbation and therefore was discharged on a prednisone taper. She did not have a fever or elevated white cell count, only productive sputum therefore she was not discharged on any antibiotics.  3. Anxiety. This was likely the cause of patient's symptoms. She was given some p.r.n. Xanax upon discharge told to follow up with her primary care physician.  4. History of Takotsubo cardiomyopathy. Patient's ejection fraction on her previous echo [facility] few [facility] was normal. It was repeated while she was here and she had normal LV function still. She was apparently taken off losartan for some low blood pressures but since her blood pressures have remained fairly stable and a bit on the higher side she was started back on losartan upon discharge.  5. CODE STATUS: Patient is a FULL CODE.    TIME SPENT: 40 minutes.  ____________________________ Rolly PancakeVivek J. Cherlynn KaiserSainani, MD vjs:cms D: 08/29/2012 16:09:48 ET T: 08/30/2012 08:19:23 ET JOB#: [id number]  cc: Rolly PancakeVivek J. Cherlynn KaiserSainani, MD, <Dictator> Muhammad A. Kirke CorinArida, MD Houston SirenVIVEK J Karmon Andis MD ELECTRONICALLY SIGNED 09/05/2012 12:56

## 2015-02-16 NOTE — H&P (Signed)
PATIENT NAME:  Heidi [name], Heidi [name] MR#:  [id number] DATE OF BIRTH:  04-Jan-1956  DATE OF ADMISSION:  08/28/2012  CHIEF COMPLAINT: Shortness of breath.  PRIMARY CARE PHYSICIAN: Nonlocal CARDIOLOGIST: [facility]   HISTORY OF PRESENT ILLNESS: Patient is a 59 year old female with a past medical history of chronic obstructive pulmonary disease and is on 3 liters of oxygen during nighttime, recent history of non-STEMI with takotsubo cardiomyopathy with an ejection fraction of 30% to 40% and tobacco abuse is presenting to the ER with a chief complaint of shortness of breath. At around 10:00 p.m. yesterday patient suddenly started having shortness of breath. She felt like she is having a panic attack. She couldn't breathe associated with chest tightness. Immediately she started breathing oxygen 3 liters and took a breathing treatment. She felt congested but eventually she felt slightly better. Still she was feeling tight in her chest. EMS was called and she was brought into the ER. Patient felt like the heart will come out of her chest. Recently she was admitted to [facility] in April 2013 with a similar complaint of shortness of breath and she was diagnosed with chronic obstructive pulmonary disease acute exacerbation associated with non-STEMI plus new diagnosis of  Takotsubocardiomyopathy with an ejection fraction of 30% to 40%. During her hospital course at [facility] patient had cardiac catheterization done and no blockages were found. Patient was eventually discharged from the hospital and followed up with cardiology and she is reporting that her ejection fraction significantly improved. During my examination patient started feeling better as she had received breathing treatment in the ER. Also she had received 125 mg of Solu-Medrol IV. She denies any chest pain during my examination and shortness of breath clinically is significantly improved. Patient's initial troponin is elevated at 0.1 but EKG did not  reveal any changes. Patient was also diagnosed with non-STEMI and heparin drip was initiated by the ER physician. Hospitalist team is called to admit the patient for acute exacerbation of chronic obstructive pulmonary disease as well as non-STEMI. Patient's initial vital signs: Temperature 98.5 with a blood pressure 105/57 and pulse 90 with a pulse ox of 96%. Patient denies any abdominal pain, nausea, vomiting or diarrhea. She denies any dizziness or diaphoresis.  PAST MEDICAL HISTORY: 1. Chronic obstructive pulmonary disease. 2. Tobacco abuse. 3. History of non-STEMI in April 2013.  4. Cardiomyopathy with Takotsubo syndrome with an ejection fraction of 30% to 40% at [facility] in April.  PAST SURGICAL HISTORY: 1. Cardiac catheterization in April 2013. No blockages are reported by the patient.  2. Hysterectomy.   ALLERGIES: Patient is allergic to codeine.   PSYCHOSOCIAL HISTORY: Lives at home. Smokes one pack a day. Has 30 pack year history of smoking. Drinks beer two to three times in a week. Denies any history of drugs.   FAMILY HISTORY: Mother deceased with massive heart attack at age 41 and dad has history of hypertension and polymyalgia rheumatica.   HOME MEDICATIONS: 1. Albuterol neb treatment q.4 hours as needed for shortness of breath.  2. ProAir HFA 2 puff inhalation four times a day. 3. Prednisone 10 mg p.o. once a day. 4. Symbicort 160 mg 2 puff inhalation two times a day.  5. Combivent 1 puff inhalation four times a day. 6. Atrovent 2 puff inhalation four times a day.   REVIEW OF SYSTEMS: CONSTITUTIONAL: Patient denies any fever, fatigue but complaining of weakness. Denies any weight gain or weight loss. EYES: Denies blurry vision or redness or inflammation.  Denies glaucoma or cataracts. ENT: Denies tinnitus, ear pain, hearing loss, postnasal drip, sinus pain, denies any difficulty swallowing. RESPIRATORY: Denies cough. Positive wheezing. Denies hemoptysis. No pneumonia.  CARDIOVASCULAR: Complaining of chest tightness but denies any orthopnea, edema, arrhythmia. She felt that she was dizzy but denies any syncopal episode. Positive palpitations when she was having shortness of breath. No varicose veins. GASTROINTESTINAL: Denies nausea, vomiting, diarrhea, abdominal pain, hematemesis, melena, ulcers, irritable bowel syndrome, jaundice. GYN: Denies breath mass or tenderness or discharge. GENITOURINARY: Denies dysuria, hematuria, renal calculi. ENDOCRINOLOGY: Denies polydipsia, nocturia. Denies any thyroid problems. HEMATOLOGY: Denies any anemia, easy bruising, bleeding. NEUROLOGICAL: Denies numbness. Complaining of weakness but denies dysarthria, epilepsy, cerebrovascular accident or transient ischemic attack. PSYCH: Complaining of insomnia. Positive anxiety. Denies obsessive-compulsive disorder. Denies bipolar disorder.   PHYSICAL EXAMINATION: VITAL SIGNS: Temperature 98.5, pulse 81, respiratory rate 18, blood pressure 119/58, pulse ox 96% on 2 to 3 liters.   GENERAL APPEARANCE: Not in acute distress, well built and well nourished.   HEENT: Normocephalic, atraumatic. Pupils are equally reacting to light and accommodation. No scleral icterus. No conjunctival injection. Moist mucous membranes.  NECK: Supple. No jugular venous distention.   LUNGS: Moderate air entry. Expiratory wheezing. No crackles. No rales.   CARDIOVASCULAR: S1, S2 regular rate and rhythm. PMI is intact.   GASTROINTESTINAL: Soft, bowel sounds are positive in all four quadrants, nontender, nondistended.   NEUROLOGICAL: Awake, alert, oriented x3. Cranial nerves II through XII are grossly intact. Motor and sensory are intact. Reflexes 2+. Tone normal.   SKIN: No rashes, no lesions.   EXTREMITIES: No cyanosis, no clubbing, edema.   LYMPHATIC SYSTEM: No lymphadenopathy.   LABORATORY, DIAGNOSTIC AND RADIOLOGICAL DATA: Chest x-ray no acute findings. 12 lead EKG sinus tachycardia 92 beats per minute,  normal PR and QRS intervals. No acute ST-T wave abnormalities. Glucose 120, sodium 143, potassium 3.7, chloride 105, CO2 26, BUN 16, creatinine 0.93, calcium 92, troponin 0.10. Urine tox screen is negative. WBC 7900, hemoglobin 12.9, hematocrit 37.8, platelet count 249,000, MCV 100.   ASSESSMENT: 1. Acute respiratory distress from acute exacerbation of chronic obstructive pulmonary disease.  2. Acute exacerbation of chronic obstructive pulmonary disease with chronic hypoxemia.  3. Chest tightness with non-STEMI.  4. Recent history of cardiomyopathy with Takotsubo cardiomyopathy with an ejection fraction of 30% to 40%.  5. Nicotine abuse.   PLAN:  1. Admit to Critical Care Unit.  2. Continue heparin drip, bolus was given in the ER.  3. ACS protocol. 4. Cycle cardia biomarkers.  5. Will obtain 2-D echocardiogram to see patient's current ejection fraction.  6. Cardiology consult is placed to [facility] and discussed with Dr. Kirke CorinArida who agrees with the current plan of management.  7. Will continue Solu-Medrol 60 mg IV q.6 hours. 8. Bronchodilator treatment with DuoNebs and Proventil as needed basis.  9. Levofloxacin 750 IV q.12 hours.  10. Nicotine cessation counseling was advised. 11. Will put her on nicotine patch.  12. Will provide her GI prophylaxis with Protonix. 13. DVT prophylaxis is not needed at this point as patient is on heparin drip. 14. FULL CODE.  15. Requesting [facility] which are pending at this time.   The diagnosis and plan of care was discussed with the patient and she is aware of the plan.  TOTAL CRITICAL CARE TIME SPENT: 60 minutes.   ____________________________ Ramonita LabAruna Malakye Nolden, MD ag:cms D: 08/28/2012 04:42:24 ET T: 08/28/2012 07:06:00 ET JOB#: [id number]  cc: Ramonita LabAruna Meg Niemeier, MD, <Dictator> [facility]  Argentina Donovan, MD Ramonita Lab MD ELECTRONICALLY SIGNED 08/29/2012 6:34

## 2015-02-19 ENCOUNTER — Ambulatory Visit
Admit: 2015-02-19 | Disposition: A | Discharge status: Home / Self Care | Payer: Self-pay | Attending: Family Medicine | Admitting: Family Medicine

## 2015-02-28 NOTE — H&P (Signed)
PATIENT NAME:  Heidi [name], Heidi [name] MR#:  [id number] DATE OF BIRTH:  03/01/1956  DATE OF ADMISSION:  11/28/2014   ADMITTING PHYSICIAN: Enid Baasadhika Nakeesha Bowler, MD   PRIMARY CARE PHYSICIAN: Primary pulmonologist: Max Fickleouglas McQuaid, MD   CHIEF COMPLAINT: Difficulty breathing and cough.   HISTORY OF PRESENT ILLNESS: Heidi [name] is a 59 year old Caucasian female with past medical history significant for chronic obstructive pulmonary disease not on home oxygen recently and gastroesophageal reflex disease, comes to the hospital secondary to worsening cough and difficulty breathing. The patient says she was diagnosed with chronic obstructive pulmonary disease about 2 years ago.  Since then she had quit smoking.  She has significant emphysema on her lungs and has been following with [name].  She has a nebulizer for as needed breathing restriction, however has been using her inhalers religiously for the last 3 days since her difficulty breathing began.  It started with cough. She thought it was a simple cold and cough, but the cough it started getting worse more at night time, she could not sleep at all for 3 nights in a row and her breathing started to get worse.  She has been on oxygen for almost 2 years and has been currently off for 2 months now because her O2 sats have been steady and more than 90%.  She said that she usually takes her time for walking at her baseline because of dyspnea on significant exertion.  However, over the last couple of days, dyspnea progressed to at rest and she has orthopnea and could not lay flat to sleep, and presents to the hospital.  A chest x-ray here shows bilateral developing infiltrate, possible pneumonia and also noted to have COPD exacerbation.   In spite of multiple rounds of nebulizer treatment, IV steroids, the patient is still very tight and is requiring admission at this time.     PAST MEDICAL HISTORY: Chronic COPD currently not on home oxygen, gastroesophageal reflex  disease, depression.   PAST SURGICAL HISTORY:   Hysterectomy.   ALLERGIES: CODEINE.  CURRENT HOME MEDICATIONS:  1.  Symbicort 160/4.5, 2 puffs twice a day.  2.  Protonix 20 mg p.o. daily.  3.  ProAir inhaler daily.  4.  Valium 5 mg at bedtime as needed for sleep.  6.  Combivent Respimat 1 puff 4 times a day.  7.  Lexapro 20 mg p.o. daily.    SOCIAL HISTORY: Currently living at home with daughter, used to smoke about 1 pack per day but quit about 2 years ago. Occasional beer drinking.   FAMILY HISTORY: Significant for hypertension.   REVIEW OF SYSTEMS.  CONSTITUTIONAL: No fever, fatigue, or weakness.  EYES: No blurred vision, double vision, inflammation or glaucoma.  ENT: No tinnitus, ear pain, hearing loss, epistaxis or discharge.  RESPIRATORY:  Positive for cough, wheezing. No hemoptysis, positive for COPD, positive for dyspnea on exertion.  CARDIOVASCULAR: No chest pain, positive for orthopnea. No edema, arrhythmia, palpitations, or syncope.  GASTROINTESTINAL: No nausea, vomiting, diarrhea, abdominal pain, hematemesis, or melena. GENITOURINARY:  No dysuria, hematuria, renal calculus, frequency, or incontinence.  ENDOCRINE: No polyuria, nocturia, thyroid problems, heat or cold intolerance.  HEMATOLOGY: No anemia, easy bruising or bleeding.  SKIN: No acne, rash or lesions.  MUSCULOSKELETAL: No neck pain, gout. NEUROLOGIC:  No numbness, weakness, CVA, transient ischemic attack or seizures.  PSYCHOLOGICAL: No anxiety, insomnia, or depression.   PHYSICAL EXAMINATION:  VITAL SIGNS: Temperature 98.9 degrees Fahrenheit, pulse 101, respirations 30, blood pressure 144/91, pulse of 97% on  room air.  GENERAL: A well-built, well-nourished female lying in bed, not in any acute distress.  HEENT: Normocephalic, atraumatic. Pupils equal, round, and reacting to light. Anicteric. Extraocular movement intact.  OROPHARYNX: Clear with no erythema, mass or exudates.  NECK: Supple. No thyromegaly,  JVD or carotid bruit. No lymphadenopathy. LUNGS: Tight on auscultation, scant breath sounds, scattered wheeze. No crackles. Minimal use of accessory muscles for breathing while talking especially.  CARDIOVASCULAR: S1, S2, regular rate and rhythm. No murmurs, rubs, or gallops.  ABDOMEN: Soft, nontender, nondistended. No hepatosplenomegaly. Normal bowel sounds.  EXTREMITIES: No pedal edema. No clubbing or cyanosis, 2+ dorsalis pedis pulses palpable bilaterally.  SKIN: No acne, rash or lesions.  MUSCULOSKELETAL: No neck fracture, pain, joint swelling noted.  LYMPHATICS: No cervical lymphadenopathy.  NEUROLOGIC: Cranial nerves intact.  No focal motor or sensory deficit.  PSYCHIATRIC: The patient is awake, alert, oriented x 3.   LABORATORY DATA: WBC 8.0, hemoglobin 13.6, hematocrit 40.4, platelet count 292,000. Sodium 139, potassium 4.6, chloride 106, bicarbonate 28, BUN 3, creatinine 0.72, glucose 85, and calcium of 8.9.  ALT 24, AST 42 alkaline phosphatase 128. Total bilirubin 0.3, albumin 3.5.  Troponin less than 0.02.  Chest x-ray showing interstitial opacity at the bases, left greater than right lung bases, prominent than on the plain films, suggests a developing infection on a background of severe emphysematous changes. EKG normal sinus rhythm. No acute ST-T wave abnormalities.   ASSESSMENT AND PLAN:  A 59 year old female with chronic obstructive pulmonary disease, currently not smoking, admitted for chronic obstructive pulmonary disease exacerbation and pneumonia.  1.  Acute on chronic obstructive pulmonary disease exacerbation, start IV steroids, continue on DuoNebs, inhalers, oxygen support as needed.  2.  Pneumonia, community acquired pneumonia. Blood cultures drawn started on Levaquin and monitor.  3.  Depression. Continue Lexapro.  4.  Gastroesophageal reflux disease. Continue Protonix.   CODE STATUS: FULL CODE.    TIME SPENT ON ADMISSION: 50 minutes.       ____________________________ Enid Baas, MD rk:DT D: 11/28/2014 14:30:22 ET T: 11/28/2014 15:09:06 ET JOB#: [id number]  cc: Enid Baas, MD, <Dictator> Lupita Leash, MD  Enid Baas MD ELECTRONICALLY SIGNED 11/28/2014 18:15

## 2015-02-28 NOTE — Discharge Summary (Signed)
PATIENT NAME:  Heidi [name], Heidi [name] MR#:  [id number] DATE OF BIRTH:  [birth year]  DATE OF ADMISSION:  11/28/2014 DATE OF DISCHARGE:  12/01/2014  DISCHARGE DIAGNOSES:  1.  Acute on chronic obstructive pulmonary disease exacerbation.  2.  Community-acquired pneumonia.   SECONDARY DIAGNOSES:   Chronic COPD currently not on home oxygen, gastroesophageal reflex disease, depression.   CONSULTATIONS: None.   PROCEDURES AND RADIOLOGY: Chest x-ray on January 30 showed interstitial opacities at the left greater than the right lung base. Possible pneumonia with underlying severe emphysematous changes.   Chest x-ray on February 2 showed COPD with biapical pleural parenchymal thickening with nodularity.   MAJOR LABORATORY PANEL: Blood cultures x 2 were negative.   HISTORY AND SHORT HOSPITAL COURSE: The patient is a 59 year old female with the above-mentioned medical problems who was admitted for difficulty breathing and cough, was found to have pneumonia with COPD exacerbation. Please see Dr. Thomasena Edis dictated history and physical for further details. The patient was started on broad-spectrum antibiotic, was slowly improving along with she was also on IV steroids. She was making good progress.  By February 2 she was feeling much better and was close to her baseline and was discharged home in stable condition.   VITAL SIGNS: On the day of discharge her vital signs are as follows: Temperature 97.4, heart rate 85 per minute, respiration 18 per minute, blood pressure 138/83. She was saturating 95% on room air.    PERTINENT PHYSICAL EXAMINATION ON THE DATE OF DISCHARGE:  CARDIOVASCULAR: S1, S2 normal. No murmurs, rubs, or gallop.  LUNGS: Clear to auscultation bilaterally. No wheezing, rales, rhonchi, or crepitation.  ABDOMEN: Soft, benign.  NEUROLOGIC: Nonfocal examination.    All other physical examination remained at baseline.   DISCHARGE MEDICATIONS:   Medication Instructions  symbicort 160  mcg-4.5 mcg/inh inhalation aerosol  2 puff(s) inhaled 2 times a day   combivent respimat cfc free 100 mcg-20 mcg/inh inhalation aerosol  1 puff(s) inhaled 4 times a day, As Needed   proair hfa cfc free 90 mcg/inh inhalation aerosol  2 puff(s) inhaled 4 times a day   valium 5 mg oral tablet  1 tab(s) orally 4 times a day, As Needed - for Anxiety, Nervousness   pantoprazole 20 mg oral delayed release tablet  1 tab(s) orally 2 times a day   lexapro 20 mg oral tablet  1 tab(s) orally once a day   benzonatate 200 mg oral capsule  1 cap(s) orally every 8 hours x 5 days   tiotropium 18 mcg inhalation capsule  1 cap(s) inhaled once a day   dextromethorphan-guaifenesin 10 mg-100 mg/5 ml oral liquid  10 milliliter(s) orally 2 times a day x 5 days, As Needed, cough , As needed, cough   amoxicillin-clavulanate 875 mg-125 mg oral tablet  1 tab(s) orally every 12 hours x 7 days   prednisone 10 mg oral tablet  Start at 60 mg and taper by 10 mg daily until complete     DISCHARGE DIET: Low sodium.   DISCHARGE ACTIVITY: As tolerated.   DISCHARGE INSTRUCTIONS AND FOLLOWUP:  The patient was instructed to follow up with her primary care physician, Dr. Venora Maples, in 1-2 weeks. She will need followup with Dr. Saintclair Halsted from pulmonary in 2-4 weeks. She was set up to get pulmonary rehabilitation as an outpatient.   TOTAL TIME DISCHARGING THIS PATIENT: 45 minutes.   She remains at very high risk for readmission.    ____________________________ Ellamae Sia. Sherryll Burger, MD vss:bu  D: 12/03/2014 17:25:45 ET T: 12/03/2014 18:00:47 ET JOB#: [id number]  cc: Kayla Deshaies S. Sherryll BurgerShah, MD, <Dictator> Janeann ForehandJames H. Hawkins Jr., MD Unknown CC   Ellamae SiaVIPUL S [facility] Hospital[facility] MD ELECTRONICALLY SIGNED 12/07/2014 14:32

## 2015-03-30 ENCOUNTER — Emergency Department: Payer: Medicare Other

## 2015-03-30 ENCOUNTER — Emergency Department
Admission: EM | Admit: 2015-03-30 | Discharge: 2015-03-30 | Disposition: A | Discharge status: Home / Self Care | Payer: Medicare Other | Attending: Emergency Medicine | Admitting: Emergency Medicine

## 2015-03-30 DIAGNOSIS — R0789 Other chest pain: Secondary | ICD-10-CM | POA: Diagnosis not present

## 2015-03-30 DIAGNOSIS — J441 Chronic obstructive pulmonary disease with (acute) exacerbation: Secondary | ICD-10-CM | POA: Insufficient documentation

## 2015-03-30 DIAGNOSIS — Z7952 Long term (current) use of systemic steroids: Secondary | ICD-10-CM | POA: Diagnosis not present

## 2015-03-30 DIAGNOSIS — J029 Acute pharyngitis, unspecified: Secondary | ICD-10-CM | POA: Insufficient documentation

## 2015-03-30 DIAGNOSIS — Z87891 Personal history of nicotine dependence: Secondary | ICD-10-CM | POA: Insufficient documentation

## 2015-03-30 DIAGNOSIS — R05 Cough: Secondary | ICD-10-CM | POA: Diagnosis present

## 2015-03-30 DIAGNOSIS — Z79899 Other long term (current) drug therapy: Secondary | ICD-10-CM | POA: Diagnosis not present

## 2015-03-30 DIAGNOSIS — J9801 Acute bronchospasm: Secondary | ICD-10-CM

## 2015-03-30 MED ORDER — METHYLPREDNISOLONE 4 MG PO TBPK: ORAL_TABLET | ORAL | Status: DC

## 2015-03-30 MED ORDER — HYDROCOD POLST-CPM POLST ER 10-8 MG/5ML PO SUER: 5.0000 mL | Freq: Two times a day (BID) | ORAL | Status: DC

## 2015-03-30 NOTE — ED Provider Notes (Signed)
[facility] Emergency Department Provider Note  ____________________________________________  Time seen: Approximately 3:04 PM  I have reviewed the triage vital signs and the nursing notes.   HISTORY  Chief Complaint Cough    HPI  Heidi [name] is a 59 y.o. female playing a nonproductive cough for the last 4 days. Patient denies any other respiratory symptoms. Patient states this cough is causing her chest discomfort. Patient denies any fever chills nausea vomiting diarrhea. States no provocative incidentally for her cough. Patient said over-the-counter cough syrup is not helping. Patient is rating her pain and discomfort as a 4/10.   Past Medical History  Diagnosis Date  . COPD (chronic obstructive pulmonary disease)   . NSTEMI (non-ST elevated myocardial infarction)     12/2011 with normal coronaries possibly secondary to Takotsubo (EF 30-35% by echo), occurred following two episodes of acute respiratory distress  . Hypotension     limiting med titration  . 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 CAP (community acquired pneumonia) 01/05/2015  . Ex-smoker 07/14/2014  . Allergic rhinitis 03/03/2014  . Anxiety 04/10/2013  . Hoarseness 10/10/2012  . Acute respiratory failure 08/08/2012  . Acute encephalopathy 08/08/2012  . COPD with acute exacerbation 08/08/2012  . Lactic acidosis 08/08/2012  . Chronic respiratory failure 03/27/2012  . Hx of MI < 8 weeks 01/31/2012  . Takotsubo cardiomyopathy 01/31/2012  . Chest pain 01/23/2012  . TINGLING 05/26/2010  . SKIN LESION 11/08/2009  . ASCUS PAP 08/02/2009  . VAG HIGH RISK HUMAN PAPILLOMAVIRUS DNA TEST POS 08/02/2009  . DEPRESSION, CHRONIC with anxiety 06/22/2009  . MICROSCOPIC HEMATURIA 06/22/2009  . Smoking history 04/06/2009  . BRUISE 04/06/2009  . URI 10/09/2007  . HYSTERECTOMY, PARTIAL, HX OF 07/01/2007  . COPD GOLD III 06/18/2007  . OSTEOPENIA  06/18/2007  . 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 Abdominal hysterectomy    . Vagina rectal repair      after childbirth  . Cardiac catheterization    . Left heart catheterization with coronary angiogram N/A 01/23/2012    Procedure: LEFT HEART CATHETERIZATION WITH CORONARY ANGIOGRAM;  Surgeon: Dolores Patty, MD;  Location: [facility] CATH LAB;  Service: Cardiovascular;  Laterality: N/A;    Current Outpatient Rx  Name  Route  Sig  Dispense  Refill  . albuterol (PROVENTIL) (2.5 MG/3ML) 0.083% nebulizer solution   Nebulization   Take 3 mLs (2.5 mg total) by nebulization every 6 (six) hours as needed for wheezing or shortness of breath.   360 mL   3   . budesonide-formoterol (SYMBICORT) 160-4.5 MCG/ACT inhaler   Inhalation   Inhale 2 puffs into the lungs 2 (two) times daily.   1 Inhaler   6   . escitalopram (LEXAPRO) 5 MG tablet   Oral   Take 20 mg by mouth daily.          . fluticasone (FLONASE) 50 MCG/ACT nasal spray   Each Nare   Place 2 sprays into both nostrils daily.   16 g   5   . ipratropium (ATROVENT) 0.02 % nebulizer solution   Nebulization   Take 2.5 mLs (0.5 mg total) by nebulization every 6 (six) hours as needed for wheezing or shortness of breath.   360 mL   3   . Ipratropium-Albuterol (COMBIVENT RESPIMAT) 20-100 MCG/ACT AERS respimat   Inhalation   Inhale 1 puff into the lungs  every 6 (six) hours as needed for wheezing.   1 Inhaler   5   . ipratropium-albuterol (DUONEB) 0.5-2.5 (3) MG/3ML SOLN   Nebulization   Take 3 mLs by nebulization every 6 (six) hours as needed.   360 mL   6   . pantoprazole (PROTONIX) 40 MG tablet   Oral   Take 40 mg by mouth 2 (two) times daily. Take 30-60 min before first meal of the day         . sodium chloride (OCEAN) 0.65 % SOLN nasal spray   Nasal   Place 1 spray into the nose as needed for congestion.   1 Bottle      . chlorpheniramine-HYDROcodone  (TUSSIONEX PENNKINETIC ER) 10-8 MG/5ML SUER   Oral   Take 5 mLs by mouth 2 (two) times daily.   115 mL   0   . diazepam (VALIUM) 5 MG tablet   Oral   Take 2.5 mg by mouth every 6 (six) hours as needed for anxiety.         . methylPREDNISolone (MEDROL DOSEPAK) 4 MG TBPK tablet      Take Tapered dose as directed   21 tablet   0   . ranitidine (ZANTAC) 150 MG tablet   Oral   Take 150 mg by mouth daily as needed for heartburn.           Allergies Codeine; Levaquin; and Zoloft  Family History  Problem Relation Age of Onset  . Heart attack Mother     deceased from massive MI at age 42  . Liver disease Father     fatty liver; living, age 46  . Stroke Brother     at age 57, living   . Hypertension Brother     living, age 69    Social History History  Substance Use Topics  . Smoking status: Former Smoker -- 1.00 packs/day for 30 years    Types: Cigarettes    Quit date: 01/21/2012  . Smokeless tobacco: Never Used  . Alcohol Use: Yes     Comment: 1-2 beers per week    Review of Systems Constitutional: No fever/chills Eyes: No visual changes. ENT: Sore throat secondary to prolonged coughing Cardiovascular: Chest pain secondary to prolonged coughing Respiratory: Mild dyspnea with prolonged coughing  Gastrointestinal: No abdominal pain.  No nausea, no vomiting.  No diarrhea.  No constipation. Genitourinary: Negative for dysuria. Musculoskeletal: Negative for back pain. Skin: Negative for rash. Neurological: Negative for headaches, focal weakness or numbness. Allergic/Immunilogical: Nausea taking codeine.10-point ROS otherwise negative.  ____________________________________________   PHYSICAL EXAM:  VITAL SIGNS: ED Triage Vitals  Enc Vitals Group     BP 03/30/15 1404 153/86 mmHg     Pulse Rate 03/30/15 1404 72     Resp 03/30/15 1404 18     Temp 03/30/15 1404 98.1 F (36.7 C)     Temp Source 03/30/15 1404 Oral     SpO2 03/30/15 1404 96 %     Weight --       Height --      Head Cir --      Peak Flow --      Pain Score 03/30/15 1404 4     Pain Loc --      Pain Edu? --      Excl. in GC? --     Constitutional: Alert and oriented. Mild distress Eyes: Conjunctivae are normal. PERRL. EOMI. Head: Atraumatic. Nose: No congestion/rhinnorhea. Mouth/Throat: Mucous membranes are moist.  Oropharynx non-erythematous. Neck: No stridor.  No deformity for nuchal range of motion nontender to palpation. Hematological/Lymphatic/Immunilogical: No cervical lymphadenopathy. Cardiovascular: Normal rate, regular rhythm. Grossly normal heart sounds.  Good peripheral circulation. Elevation of blood pressure. Respiratory: Normal respiratory effort.  No retractions. Lungs CTAB. Increased cough with deep inspirations. Gastrointestinal: Soft and nontender. No distention. No abdominal bruits. No CVA tenderness. Musculoskeletal: No lower extremity tenderness nor edema.  No joint effusions. Neurologic:  Normal speech and language. No gross focal neurologic deficits are appreciated. Speech is normal. No gait instability. Skin:  Skin is warm, dry and intact. No rash noted. Psychiatric: Mood and affect are normal. Speech and behavior are normal.  ____________________________________________   LABS (all labs ordered are listed, but only abnormal results are displayed)  Labs Reviewed - No data to display ____________________________________________  EKG   ____________________________________________  RADIOLOGY no acute findings x-ray showed chronic COPD  ____________________________________________   PROCEDURES  Procedure(s) performed: None  Critical Care performed: No  ____________________________________________   INITIAL IMPRESSION / ASSESSMENT AND PLAN / ED COURSE  Pertinent labs & imaging results that were available during my care of the patient were reviewed by me and considered in my medical decision making (see chart for details). Upper  history infection ____________________________________________   FINAL CLINICAL IMPRESSION(S) / ED DIAGNOSES  Final diagnoses:  Bronchospasm, acute      Joni ReiningRonald K Smith, PA-C 03/30/15 1524  I was in the ER during the daytime the patient was here and available for consult  Arnaldo NatalPaul F Malinda, MD 03/30/15 1711

## 2015-03-30 NOTE — ED Notes (Signed)
Pt c/o cough with congestion for the past 4 days. °

## 2015-03-30 NOTE — Discharge Instructions (Signed)
Bronchospasm °A bronchospasm is a spasm or tightening of the airways going into the lungs. During a bronchospasm breathing becomes more difficult because the airways get smaller. When this happens there can be coughing, a whistling sound when breathing (wheezing), and difficulty breathing. Bronchospasm is often associated with asthma, but not all patients who experience a bronchospasm have asthma. °CAUSES  °A bronchospasm is caused by inflammation or irritation of the airways. The inflammation or irritation may be triggered by:  °· Allergies (such as to animals, pollen, food, or mold). Allergens that cause bronchospasm may cause wheezing immediately after exposure or many hours later.   °· Infection. Viral infections are believed to be the most common cause of bronchospasm.   °· Exercise.   °· Irritants (such as pollution, cigarette smoke, strong odors, aerosol sprays, and paint fumes).   °· Weather changes. Winds increase molds and pollens in the air. Rain refreshes the air by washing irritants out. Cold air may cause inflammation.   °· Stress and emotional upset.   °SIGNS AND SYMPTOMS  °· Wheezing.   °· Excessive nighttime coughing.   °· Frequent or severe coughing with a simple cold.   °· Chest tightness.   °· Shortness of breath.   °DIAGNOSIS  °Bronchospasm is usually diagnosed through a history and physical exam. Tests, such as chest X-rays, are sometimes done to look for other conditions. °TREATMENT  °· Inhaled medicines can be given to open up your airways and help you breathe. The medicines can be given using either an inhaler or a nebulizer machine. °· Corticosteroid medicines may be given for severe bronchospasm, usually when it is associated with asthma. °HOME CARE INSTRUCTIONS  °· Always have a plan prepared for seeking medical care. Know when to call your health care provider and local emergency services (911 in the [place] Know where you can access local emergency care. °· Only take medicines as  directed by your health care provider. °· If you were prescribed an inhaler or nebulizer machine, ask your health care provider to explain how to use it correctly. Always use a spacer with your inhaler if you were given one. °· It is necessary to remain calm during an attack. Try to relax and breathe more slowly.  °· Control your home environment in the following ways:   °¨ Change your heating and air conditioning filter at least once a month.   °¨ Limit your use of fireplaces and wood stoves. °¨ Do not smoke and do not allow smoking in your home.   °¨ Avoid exposure to perfumes and fragrances.   °¨ Get rid of pests (such as roaches and mice) and their droppings.   °¨ Throw away plants if you see mold on them.   °¨ Keep your house clean and dust free.   °¨ Replace carpet with wood, tile, or vinyl flooring. Carpet can trap dander and dust.   °¨ Use allergy-proof pillows, mattress covers, and box spring covers.   °¨ Wash bed sheets and blankets every week in hot water and dry them in a dryer.   °¨ Use blankets that are made of polyester or cotton.   °¨ Wash hands frequently. °SEEK MEDICAL CARE IF:  °· You have muscle aches.   °· You have chest pain.   °· The sputum changes from clear or white to yellow, green, gray, or bloody.   °· The sputum you cough up gets thicker.   °· There are problems that may be related to the medicine you are given, such as a rash, itching, swelling, or trouble breathing.   °SEEK IMMEDIATE MEDICAL CARE IF:  °· You have worsening wheezing and coughing even   after taking your prescribed medicines.   °· You have increased difficulty breathing.   °· You develop severe chest pain. °MAKE SURE YOU:  °· Understand these instructions. °· Will watch your condition. °· Will get help right away if you are not doing well or get worse. °Document Released: 10/19/2003 Document Revised: 10/21/2013 Document Reviewed: 04/07/2013 °Exit[employer]® Patient Information ©2015 [employer]. This information is not  intended to replace advice given to you by your health care provider. Make sure you discuss any questions you have with your health care provider. ° °

## 2015-06-29 ENCOUNTER — Other Ambulatory Visit: Payer: Self-pay | Admitting: Family Medicine

## 2015-07-02 ENCOUNTER — Emergency Department: Payer: Medicare Other

## 2015-07-02 ENCOUNTER — Encounter: Payer: Self-pay | Admitting: *Deleted

## 2015-07-02 ENCOUNTER — Emergency Department
Admission: EM | Admit: 2015-07-02 | Discharge: 2015-07-02 | Disposition: A | Discharge status: Home / Self Care | Payer: Medicare Other | Attending: Emergency Medicine | Admitting: Emergency Medicine

## 2015-07-02 DIAGNOSIS — I252 Old myocardial infarction: Secondary | ICD-10-CM | POA: Diagnosis not present

## 2015-07-02 DIAGNOSIS — Z87891 Personal history of nicotine dependence: Secondary | ICD-10-CM | POA: Diagnosis not present

## 2015-07-02 DIAGNOSIS — R079 Chest pain, unspecified: Secondary | ICD-10-CM | POA: Diagnosis present

## 2015-07-02 DIAGNOSIS — Z79899 Other long term (current) drug therapy: Secondary | ICD-10-CM | POA: Insufficient documentation

## 2015-07-02 DIAGNOSIS — Z7951 Long term (current) use of inhaled steroids: Secondary | ICD-10-CM | POA: Diagnosis not present

## 2015-07-02 LAB — CBC
HEMATOCRIT: 40.2 % (ref 35.0–47.0)
HEMOGLOBIN: 13.4 g/dL (ref 12.0–16.0)
MCH: 33 pg (ref 26.0–34.0)
MCHC: 33.2 g/dL (ref 32.0–36.0)
MCV: 99.4 fL (ref 80.0–100.0)
Platelets: 253 10*3/uL (ref 150–440)
RBC: 4.05 MIL/uL (ref 3.80–5.20)
RDW: 13.5 % (ref 11.5–14.5)
WBC: 9.3 10*3/uL (ref 3.6–11.0)

## 2015-07-02 LAB — BASIC METABOLIC PANEL
ANION GAP: 7 (ref 5–15)
BUN: 14 mg/dL (ref 6–20)
CO2: 29 mmol/L (ref 22–32)
Calcium: 9.1 mg/dL (ref 8.9–10.3)
Chloride: 101 mmol/L (ref 101–111)
Creatinine, Ser: 0.91 mg/dL (ref 0.44–1.00)
GFR calc Af Amer: >60 mL/min (ref >60)
GFR calc non Af Amer: >60 mL/min (ref >60)
Glucose, Bld: 153 mg/dL — ABNORMAL HIGH (ref 65–99)
POTASSIUM: 3.7 mmol/L (ref 3.5–5.1)
SODIUM: 137 mmol/L (ref 135–145)

## 2015-07-02 LAB — TROPONIN I
Troponin I: <0.03 ng/mL (ref <0.031)
Troponin I: <0.03 ng/mL (ref <0.031)

## 2015-07-02 MED ORDER — ASPIRIN 81 MG PO CHEW
CHEWABLE_TABLET | ORAL | Status: AC
  Filled 2015-07-02: qty 4

## 2015-07-02 MED ORDER — ASPIRIN 81 MG PO CHEW
324.0000 mg | CHEWABLE_TABLET | Freq: Once | ORAL | Status: AC
  Administered 2015-07-02: 324 mg via ORAL

## 2015-07-02 MED ORDER — GI COCKTAIL ~~LOC~~
30.0000 mL | Freq: Once | ORAL | Status: AC
  Administered 2015-07-02: 30 mL via ORAL

## 2015-07-02 MED ORDER — GI COCKTAIL ~~LOC~~
ORAL | Status: AC
  Filled 2015-07-02: qty 30

## 2015-07-02 NOTE — ED Notes (Signed)
Pt reports around midnight waking up with chest pain, associated with diaphoresis. She thought it may have been acid reflux, took her acid reflux meds & pepto. The pain was better then became worse again. She took 2 tylenol about 1 hour ago, the pain has eased of some.

## 2015-07-02 NOTE — ED Provider Notes (Signed)
[facility] Emergency Department Provider Note  ____________________________________________  Time seen: 2:10 AM  I have reviewed the triage vital signs and the nursing notes.   HISTORY  Chief Complaint Chest Pain    HPI Heidi [name] is a 59 y.o. female presents with acute onset of chest pain times approximately 2 hours associated with diaphoresis. Patient states she thought it may been acid reflux and so she took her acid reflux medication including Pepto-Bismol and Protonix without relief. In addition patient states that she took 2 Tylenols about an hour ago which she believes is eased the pain a bit. Patient denies any dyspnea no nausea no vomiting. Of note patient states that her mother had a heart attack at the age of 58. Patient has a history of COPD but no other medical history. Reviewed the patient's chart reveals in NSTEMI March 2013 with negative catheterization     Past Medical History  Diagnosis Date  . COPD (chronic obstructive pulmonary disease)   . NSTEMI (non-ST elevated myocardial infarction)     12/2011 with normal coronaries possibly secondary to Takotsubo (EF 30-35% by echo), occurred following two episodes of acute respiratory distress  . Hypotension     limiting med titration  . 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 CAP (community acquired pneumonia) 01/05/2015  . Ex-smoker 07/14/2014  . Allergic rhinitis 03/03/2014  . Anxiety 04/10/2013  . Hoarseness 10/10/2012  . Acute respiratory failure 08/08/2012  . Acute encephalopathy 08/08/2012  . COPD with acute exacerbation 08/08/2012  . Lactic acidosis 08/08/2012  . Chronic respiratory failure 03/27/2012  . Hx of MI < 8 weeks 01/31/2012  . Takotsubo cardiomyopathy 01/31/2012  . Chest pain 01/23/2012  . TINGLING 05/26/2010  . SKIN LESION 11/08/2009  . ASCUS PAP 08/02/2009  . VAG HIGH RISK HUMAN PAPILLOMAVIRUS DNA TEST POS 08/02/2009   . DEPRESSION, CHRONIC with anxiety 06/22/2009  . MICROSCOPIC HEMATURIA 06/22/2009  . Smoking history 04/06/2009  . BRUISE 04/06/2009  . URI 10/09/2007  . HYSTERECTOMY, PARTIAL, HX OF 07/01/2007  . COPD GOLD III 06/18/2007  . OSTEOPENIA 06/18/2007  . 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 Abdominal hysterectomy    . Vagina rectal repair      after childbirth  . Cardiac catheterization    . Left heart catheterization with coronary angiogram N/A 01/23/2012    Procedure: LEFT HEART CATHETERIZATION WITH CORONARY ANGIOGRAM;  Surgeon: Dolores Patty, MD;  Location: [facility] CATH LAB;  Service: Cardiovascular;  Laterality: N/A;    Current Outpatient Rx  Name  Route  Sig  Dispense  Refill  . albuterol (PROVENTIL) (2.5 MG/3ML) 0.083% nebulizer solution   Nebulization   Take 3 mLs (2.5 mg total) by nebulization every 6 (six) hours as needed for wheezing or shortness of breath.   360 mL   3   . budesonide-formoterol (SYMBICORT) 160-4.5 MCG/ACT inhaler   Inhalation   Inhale 2 puffs into the lungs 2 (two) times daily.   1 Inhaler   6   . chlorpheniramine-HYDROcodone (TUSSIONEX PENNKINETIC ER) 10-8 MG/5ML SUER   Oral   Take 5 mLs by mouth 2 (two) times daily.   115 mL   0   . diazepam (VALIUM) 5 MG tablet   Oral   Take 2.5 mg by mouth every 6 (six) hours as needed for anxiety.         Marland Kitchen escitalopram (  LEXAPRO) 5 MG tablet   Oral   Take 20 mg by mouth daily.          . fluticasone (FLONASE) 50 MCG/ACT nasal spray   Each Nare   Place 2 sprays into both nostrils daily.   16 g   5   . ipratropium (ATROVENT) 0.02 % nebulizer solution   Nebulization   Take 2.5 mLs (0.5 mg total) by nebulization every 6 (six) hours as needed for wheezing or shortness of breath.   360 mL   3   . Ipratropium-Albuterol (COMBIVENT RESPIMAT) 20-100 MCG/ACT AERS respimat   Inhalation   Inhale 1 puff into the lungs every 6 (six) hours as needed  for wheezing.   1 Inhaler   5   . ipratropium-albuterol (DUONEB) 0.5-2.5 (3) MG/3ML SOLN   Nebulization   Take 3 mLs by nebulization every 6 (six) hours as needed.   360 mL   6   . methylPREDNISolone (MEDROL DOSEPAK) 4 MG TBPK tablet      Take Tapered dose as directed   21 tablet   0   . pantoprazole (PROTONIX) 40 MG tablet   Oral   Take 40 mg by mouth 2 (two) times daily. Take 30-60 min before first meal of the day         . PROAIR HFA 108 (90 BASE) MCG/ACT inhaler      INHALE TWO PUFFS BY MOUTH 4 TIMES DAILY AS NEEDED   3 each   0   . ranitidine (ZANTAC) 150 MG tablet   Oral   Take 150 mg by mouth daily as needed for heartburn.         . sodium chloride (OCEAN) 0.65 % SOLN nasal spray   Nasal   Place 1 spray into the nose as needed for congestion.   1 Bottle        Allergies Codeine; Levaquin; and Zoloft  Family History  Problem Relation Age of Onset  . Heart attack Mother     deceased from massive MI at age 49  . Liver disease Father     fatty liver; living, age 40  . Stroke Brother     at age 32, living   . Hypertension Brother     living, age 69    Social History Social History  Substance Use Topics  . Smoking status: Former Smoker -- 1.00 packs/day for 30 years    Types: Cigarettes    Quit date: 01/21/2012  . Smokeless tobacco: Never Used  . Alcohol Use: Yes     Comment: 1-2 beers per week    Review of Systems  Constitutional: Negative for fever. Eyes: Negative for visual changes. ENT: Negative for sore throat Cardiovascular: Positive for chest pain. Respiratory: Negative for shortness of breath. Gastrointestinal: Negative for abdominal pain, vomiting and diarrhea. Genitourinary: Negative for dysuria. Musculoskeletal: Negative for back pain. Skin: Negative for rash. Neurological: Negative for headaches or focal weakness     ____________________________________________   PHYSICAL EXAM:  VITAL SIGNS: ED Triage Vitals  Enc  Vitals Group     BP 07/02/15 0201 148/77 mmHg     Pulse Rate 07/02/15 0201 72     Resp 07/02/15 0201 20     Temp 07/02/15 0201 98.5 F (36.9 C)     Temp Source 07/02/15 0201 Oral     SpO2 07/02/15 0201 97 %     Weight 07/02/15 0201 123 lb (55.792 kg)     Height 07/02/15 0201 5\' 6"  (  1.676 m)     Head Cir --      Peak Flow --      Pain Score 07/02/15 0202 2     Pain Loc --      Pain Edu? --      Excl. in GC? --    Constitutional: Alert and oriented. Well appearing and in no distress. Eyes: Conjunctivae are normal.  ENT   Head: Normocephalic and atraumatic.   Mouth/Throat: Mucous membranes are moist. Cardiovascular: Normal rate, regular rhythm. Normal and symmetric distal pulses are present in all extremities. No murmurs, rubs, or gallops. Respiratory: Normal respiratory effort without tachypnea nor retractions. Breath sounds are clear and equal bilaterally.  Gastrointestinal: Soft and non-tender in all quadrants. No distention. There is no CVA tenderness. Genitourinary: deferred Musculoskeletal: Nontender with normal range of motion in all extremities. No lower extremity tenderness nor edema. Neurologic:  Normal speech and language. No gross focal neurologic deficits are appreciated. Skin:  Skin is warm, dry and intact. No rash noted. Psychiatric: Mood and affect are normal. Patient exhibits appropriate insight and judgment.  ____________________________________________    LABS (pertinent positives/negatives)  Labs Reviewed  BASIC METABOLIC PANEL - Abnormal; Notable for the following:    Glucose, Bld 153 (*)    All other components within normal limits  CBC  TROPONIN I  TROPONIN I    ____________________________________________   EKG  ED ECG REPORT I, Keundra Petrucelli, [place], the attending physician, personally viewed and interpreted this ECG.   Date: 07/02/2015  EKG Time: 1:59 AM  Rate: 73  Rhythm: Normal sinus rhythm  Axis: None  Intervals: Normal  ST&T  Change: None   ____________________________________________    RADIOLOGY I have personally reviewed any xrays that were ordered on this patient:  DG Chest 2 View (Final result) Result time: 07/02/15 02:44:17   Final result by Rad Results In Interface (07/02/15 02:44:17)   Narrative:   CLINICAL DATA: Acute onset of generalized chest pain and diaphoresis. Initial encounter.  EXAM: CHEST 2 VIEW  COMPARISON: Chest radiograph from 03/30/2015  FINDINGS: The lungs are hyperexpanded, with flattening of the hemidiaphragms, compatible with COPD. Mild scarring is noted at the right lung apex. There is no evidence of pleural effusion or pneumothorax.  The heart is normal in size; the mediastinal contour is within normal limits. No acute osseous abnormalities are seen.  IMPRESSION: Findings of COPD. Lungs otherwise grossly clear.   Electronically Signed By: Roanna Raider M.D. On: 07/02/2015 02:44       ____________________________________________   PROCEDURES  Procedure(s) performed: none    ____________________________________________   INITIAL IMPRESSION / ASSESSMENT AND PLAN / ED COURSE  Pertinent labs & imaging results that were available during my care of the patient were reviewed by me and considered in my medical decision making (see chart for details).  History of physical exam concerning for possible cardiac etiology for chest pain as such EKG performed which was unremarkable. Troponin performed 2 which were both negative. Considered possible PE however Patient with no dyspnea no tachypnea no leg pain or swelling and no history DVT. Patient's current pain score 1 out of 10, patient states consistent with previous episodes of reflux. Patient referred to Dr. Juliann Pares cardiologist for outpatient evaluation.  ____________________________________________   FINAL CLINICAL IMPRESSION(S) / ED DIAGNOSES  Final diagnoses:  Chest pain, unspecified chest  pain type     Darci Current, MD 07/02/15 [phone]

## 2015-07-02 NOTE — Discharge Instructions (Signed)

## 2015-07-07 ENCOUNTER — Other Ambulatory Visit: Payer: Self-pay

## 2015-07-07 MED ORDER — IPRATROPIUM-ALBUTEROL 20-100 MCG/ACT IN AERS: 1.0000 | INHALATION_SPRAY | Freq: Four times a day (QID) | RESPIRATORY_TRACT | Status: DC | PRN

## 2015-07-07 MED ORDER — FLUTICASONE PROPIONATE 50 MCG/ACT NA SUSP
2.0000 | Freq: Every day | NASAL | Status: DC
Start: 2015-07-07 — End: 2016-03-12

## 2015-07-14 ENCOUNTER — Other Ambulatory Visit: Payer: Self-pay | Admitting: Family Medicine

## 2015-07-14 NOTE — Telephone Encounter (Signed)
Last office visit 07/07/2014 Do you approve?

## 2015-08-13 ENCOUNTER — Ambulatory Visit (INDEPENDENT_AMBULATORY_CARE_PROVIDER_SITE_OTHER): Payer: Medicare Other | Admitting: Pulmonary Disease

## 2015-08-13 ENCOUNTER — Encounter: Payer: Self-pay | Admitting: Pulmonary Disease

## 2015-08-13 VITALS — BP 128/74 | HR 80 | Temp 97.5°F | Ht 66.0 in | Wt 125.8 lb

## 2015-08-13 DIAGNOSIS — Z23 Encounter for immunization: Secondary | ICD-10-CM | POA: Diagnosis not present

## 2015-08-13 DIAGNOSIS — J432 Centrilobular emphysema: Secondary | ICD-10-CM | POA: Diagnosis not present

## 2015-08-13 MED ORDER — BUDESONIDE-FORMOTEROL FUMARATE 160-4.5 MCG/ACT IN AERO: 2.0000 | INHALATION_SPRAY | Freq: Two times a day (BID) | RESPIRATORY_TRACT | Status: DC

## 2015-08-13 MED ORDER — IPRATROPIUM BROMIDE 0.02 % IN SOLN: 0.5000 mg | Freq: Four times a day (QID) | RESPIRATORY_TRACT | Status: DC | PRN

## 2015-08-13 MED ORDER — IPRATROPIUM-ALBUTEROL 20-100 MCG/ACT IN AERS: 1.0000 | INHALATION_SPRAY | Freq: Four times a day (QID) | RESPIRATORY_TRACT | Status: DC | PRN

## 2015-08-13 NOTE — Progress Notes (Signed)
Subjective:    Patient ID: Heidi [name], female    DOB: 06-05-1956, 59 y.o.   MRN: [id number]  Synopsis: Heidi [name] first saw Dr. Sherene Sires with the [facility] in 2013. She has COPD and 8 2013 FEV1 was 0.72 L (27% predicted). She smoked heavily up until October of 2013.   HPI  Chief Complaint  Patient presents with  . Follow-up    6 month COPD follow up - breathing is at baseline.  no new complaints.  would like to know about Prevnar13.  insurance doesn't pay for the Belisa Eichholz has been doing well.  No problems with her breathing since her episode of pneumonia in April. NO breathing trouble.  She continue to take Symbicort. She is trying to walk regularly, she goes maybe 200 yards at a time. Not too much trouble breathing when she does that. She says that if she gets anxious she has more trouble breathing.   She is not coughing too much.  She had a flu shot one week ago at [employer].   Past Medical History  Diagnosis Date  . COPD (chronic obstructive pulmonary disease) (HCC)   . NSTEMI (non-ST elevated myocardial infarction) (HCC)     12/2011 with normal coronaries possibly secondary to Takotsubo (EF 30-35% by echo), occurred following two episodes of acute respiratory distress  . Hypotension     limiting med titration  . Takotsubo syndrome 4/13      Review of Systems  Constitutional: Negative for fever, chills and fatigue.  HENT: Negative for congestion, postnasal drip, rhinorrhea and sinus pressure.   Respiratory: Positive for shortness of breath. Negative for cough and wheezing.   Cardiovascular: Negative for chest pain, palpitations and leg swelling.       Objective:   Physical Exam Filed Vitals:   08/13/15 1221  BP: 128/74  Pulse: 80  Temp: 97.5 F (36.4 C)  TempSrc: Oral  Height:  (1.676 m)  Weight: 125 lb 12.8 oz (57.063 kg)  SpO2: 96%  RA  Gen: well appearing, no acute distress HEENT: NCAT, EOMi, OP clear, PULM: CTA  B, normal effort CV: RRR, no mgr, no JVD, skin well perfused AB: BS+, soft, nontender, no hsm Ext: warm, no edema, no clubbing, no cyanosis      Assessment & Plan:   COPD GOLD III This has been a stable interval for Heidi [name]. She has severe COPD but her symptoms are manageable on her current medication regimen. On the surface it seems that her medication regimen is convoluted but there is actually quite good reason why her medications are prescribed as they are.  Plan: Continue Symbicort every day for regular use Continue the prescription for pro-air as she uses this for rescue when she is out Continue Combivent to use 4 times a day Continue albuterol nebulizer and ipratropium nebulizer separately to use for rescue use at home, it's cheaper for her than DuoNeb Prevnar vaccine today Follow-up 6 months percent if needed    Updated Medication List Outpatient Encounter Prescriptions as of 08/13/2015  Medication Sig  . albuterol (PROVENTIL) (2.5 MG/3ML) 0.083% nebulizer solution Take 3 mLs (2.5 mg total) by nebulization every 6 (six) hours as needed for wheezing or shortness of breath.  . budesonide-formoterol (SYMBICORT) 160-4.5 MCG/ACT inhaler Inhale 2 puffs into the lungs 2 (two) times daily.  Marland Kitchen escitalopram (LEXAPRO) 5 MG tablet Take 20 mg by mouth daily.   . fluticasone (FLONASE) 50 MCG/ACT nasal spray Place 2 sprays into  both nostrils daily.  Marland Kitchen. ipratropium (ATROVENT) 0.02 % nebulizer solution Take 2.5 mLs (0.5 mg total) by nebulization every 6 (six) hours as needed for wheezing or shortness of breath.  . Ipratropium-Albuterol (COMBIVENT RESPIMAT) 20-100 MCG/ACT AERS respimat Inhale 1 puff into the lungs every 6 (six) hours as needed for wheezing.  . pantoprazole (PROTONIX) 40 MG tablet Take 40 mg by mouth 2 (two) times daily. Take 30-60 min before first meal of the day  . PROAIR HFA 108 (90 BASE) MCG/ACT inhaler INHALE TWO PUFFS BY MOUTH 4 TIMES DAILY AS NEEDED  . [DISCONTINUED]  chlorpheniramine-HYDROcodone (TUSSIONEX PENNKINETIC ER) 10-8 MG/5ML SUER Take 5 mLs by mouth 2 (two) times daily. (Patient not taking: Reported on 08/13/2015)  . [DISCONTINUED] ipratropium-albuterol (DUONEB) 0.5-2.5 (3) MG/3ML SOLN Take 3 mLs by nebulization every 6 (six) hours as needed. (Patient not taking: Reported on 08/13/2015)  . [DISCONTINUED] methylPREDNISolone (MEDROL DOSEPAK) 4 MG TBPK tablet Take Tapered dose as directed (Patient not taking: Reported on 08/13/2015)  . [DISCONTINUED] sodium chloride (OCEAN) 0.65 % SOLN nasal spray Place 1 spray into the nose as needed for congestion. (Patient not taking: Reported on 08/13/2015)   No facility-administered encounter medications on file as of 08/13/2015.

## 2015-08-13 NOTE — Patient Instructions (Signed)
Keep taking your medications as you're doing Stay active, exercise regularly We will see you back in 6 months or sooner if needed 

## 2015-08-13 NOTE — Addendum Note (Signed)
Addended by: Gweneth DimitriJONES, Mohammedali Bedoy D on: 08/13/2015 01:03 PM   Modules accepted: Orders

## 2015-08-13 NOTE — Assessment & Plan Note (Signed)
This has been a stable interval for Heidi [name]. She has severe COPD but her symptoms are manageable on her current medication regimen. On the surface it seems that her medication regimen is convoluted but there is actually quite good reason why her medications are prescribed as they are.  Plan: Continue Symbicort every day for regular use Continue the prescription for pro-air as she uses this for rescue when she is out Continue Combivent to use 4 times a day Continue albuterol nebulizer and ipratropium nebulizer separately to use for rescue use at home, it's cheaper for her than DuoNeb Prevnar vaccine today Follow-up 6 months percent if needed

## 2015-10-02 ENCOUNTER — Emergency Department
Admission: EM | Admit: 2015-10-02 | Discharge: 2015-10-02 | Disposition: A | Discharge status: Home / Self Care | Payer: Medicare Other | Attending: Emergency Medicine | Admitting: Emergency Medicine

## 2015-10-02 ENCOUNTER — Encounter: Payer: Self-pay | Admitting: Emergency Medicine

## 2015-10-02 ENCOUNTER — Emergency Department: Payer: Medicare Other

## 2015-10-02 DIAGNOSIS — Z79899 Other long term (current) drug therapy: Secondary | ICD-10-CM | POA: Insufficient documentation

## 2015-10-02 DIAGNOSIS — Z87891 Personal history of nicotine dependence: Secondary | ICD-10-CM | POA: Diagnosis not present

## 2015-10-02 DIAGNOSIS — I10 Essential (primary) hypertension: Secondary | ICD-10-CM | POA: Diagnosis not present

## 2015-10-02 DIAGNOSIS — Z7951 Long term (current) use of inhaled steroids: Secondary | ICD-10-CM | POA: Diagnosis not present

## 2015-10-02 DIAGNOSIS — R079 Chest pain, unspecified: Secondary | ICD-10-CM | POA: Diagnosis not present

## 2015-10-02 HISTORY — DX: Gastro-esophageal reflux disease without esophagitis: K21.9

## 2015-10-02 LAB — CBC
HCT: 39.9 % (ref 35.0–47.0)
Hemoglobin: 13.4 g/dL (ref 12.0–16.0)
MCH: 32.7 pg (ref 26.0–34.0)
MCHC: 33.6 g/dL (ref 32.0–36.0)
MCV: 97.4 fL (ref 80.0–100.0)
Platelets: 244 10*3/uL (ref 150–440)
RBC: 4.1 MIL/uL (ref 3.80–5.20)
RDW: 12.9 % (ref 11.5–14.5)
WBC: 6.7 10*3/uL (ref 3.6–11.0)

## 2015-10-02 LAB — BASIC METABOLIC PANEL
Anion gap: 5 (ref 5–15)
BUN: 9 mg/dL (ref 6–20)
CALCIUM: 9.2 mg/dL (ref 8.9–10.3)
CHLORIDE: 105 mmol/L (ref 101–111)
CO2: 29 mmol/L (ref 22–32)
CREATININE: 0.85 mg/dL (ref 0.44–1.00)
GFR calc non Af Amer: >60 mL/min (ref >60)
GLUCOSE: 119 mg/dL — AB (ref 65–99)
Potassium: 3.8 mmol/L (ref 3.5–5.1)
Sodium: 139 mmol/L (ref 135–145)

## 2015-10-02 LAB — TROPONIN I

## 2015-10-02 NOTE — ED Provider Notes (Signed)
[facility] Emergency Department Provider Note  ____________________________________________  Time seen: Approximately 930 PM  I have reviewed the triage vital signs and the nursing notes.   HISTORY  Chief Complaint Chest Pain    HPI ALY SEIDENBERG is a 59 y.o. 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 She denies any chest pain at this time and says that she has had 2 episodes of chest pain that'll last about 30-40 minutes each. She says that she feels "clammy" during these episodes. She says that she's been taking Protonix without relief but says that she's been told that she may take it twice a day and is only done this once. She is also been trying Maalox without relief. She denies any provoking symptoms such as exertion. She says that when she is up and walk around she has no chest pain or shortness of breath. Denies any nausea or vomiting but says that she has felt "bloated." Denies any radiation of the pain.   Past Medical History  Diagnosis Date  . COPD (chronic obstructive pulmonary disease) (HCC)   . NSTEMI (non-ST elevated myocardial infarction) (HCC)     12/2011 with normal coronaries possibly secondary to Takotsubo (EF 30-35% by echo), occurred following two episodes of acute respiratory distress  . Hypotension     limiting med titration  . 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 GERD (gastroesophageal reflux disease)     Patient Active Problem List   Diagnosis Date Noted  . CAP (community acquired pneumonia) 01/05/2015  . Ex-smoker 07/14/2014  . Allergic rhinitis 03/03/2014  . Anxiety 04/10/2013  . Hoarseness 10/10/2012  . Acute respiratory failure (HCC) 08/08/2012  . Acute encephalopathy 08/08/2012  . COPD with acute exacerbation (HCC) 08/08/2012  . Lactic acidosis 08/08/2012  . Chronic respiratory failure (HCC) 03/27/2012  . Hx of MI < 8 weeks 01/31/2012  . Takotsubo cardiomyopathy  01/31/2012  . Chest pain 01/23/2012  . TINGLING 05/26/2010  . SKIN LESION 11/08/2009  . ASCUS PAP 08/02/2009  . VAG HIGH RISK HUMAN PAPILLOMAVIRUS DNA TEST POS 08/02/2009  . DEPRESSION, CHRONIC with anxiety 06/22/2009  . MICROSCOPIC HEMATURIA 06/22/2009  . Smoking history 04/06/2009  . BRUISE 04/06/2009  . URI 10/09/2007  . HYSTERECTOMY, PARTIAL, HX OF 07/01/2007  . COPD GOLD III 06/18/2007  . OSTEOPENIA 06/18/2007  . 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 Abdominal hysterectomy    . Vagina rectal repair      after childbirth  . Cardiac catheterization    . Left heart catheterization with coronary angiogram N/A 01/23/2012    Procedure: LEFT HEART CATHETERIZATION WITH CORONARY ANGIOGRAM;  Surgeon: Dolores Patty, MD;  Location: [facility] CATH LAB;  Service: Cardiovascular;  Laterality: N/A;    Current Outpatient Rx  Name  Route  Sig  Dispense  Refill  . albuterol (PROVENTIL) (2.5 MG/3ML) 0.083% nebulizer solution   Nebulization   Take 3 mLs (2.5 mg total) by nebulization every 6 (six) hours as needed for wheezing or shortness of breath.   360 mL   3   . ALPRAZolam (XANAX) 0.25 MG tablet   Oral   Take 0.25 mg by mouth at bedtime as needed for anxiety.         . budesonide-formoterol (SYMBICORT) 160-4.5 MCG/ACT inhaler   Inhalation   Inhale 2 puffs into the lungs 2 (two) times daily.   1 Inhaler  6   . fluticasone (FLONASE) 50 MCG/ACT nasal spray   Each Nare   Place 2 sprays into both nostrils daily.   16 g   5   . ipratropium (ATROVENT) 0.02 % nebulizer solution   Nebulization   Take 2.5 mLs (0.5 mg total) by nebulization every 6 (six) hours as needed for wheezing or shortness of breath. Dx Code J43.2   360 mL   3   . Ipratropium-Albuterol (COMBIVENT RESPIMAT) 20-100 MCG/ACT AERS respimat   Inhalation   Inhale 1 puff into the lungs every 6 (six) hours as needed for wheezing.   1 Inhaler   5   .  pantoprazole (PROTONIX) 40 MG tablet   Oral   Take 40 mg by mouth 2 (two) times daily. Take 30-60 min before first meal of the day         . PROAIR HFA 108 (90 BASE) MCG/ACT inhaler      INHALE TWO PUFFS BY MOUTH 4 TIMES DAILY AS NEEDED   3 each   0   . escitalopram (LEXAPRO) 5 MG tablet   Oral   Take 20 mg by mouth daily.            Allergies Codeine; Levaquin; and Zoloft  Family History  Problem Relation Age of Onset  . Heart attack Mother     deceased from massive MI at age 59  . Liver disease Father     fatty liver; living, age 59  . Stroke Brother     at age 59, living   . Hypertension Brother     living, age 59    Social History Social History  Substance Use Topics  . Smoking status: Former Smoker -- 1.00 packs/day for 30 years    Types: Cigarettes    Quit date: 01/21/2012  . Smokeless tobacco: Never Used  . Alcohol Use: Yes     Comment: 1-2 beers per week    Review of Systems Constitutional: No fever/chills Eyes: No visual changes. ENT: No sore throat. Cardiovascular: As above  Respiratory: Denies shortness of breath. Gastrointestinal: No abdominal pain.  No nausea, no vomiting.  No diarrhea.  No constipation. Genitourinary: Negative for dysuria. Musculoskeletal: Negative for back pain. Skin: Negative for rash. Neurological: Negative for headaches, focal weakness or numbness.  10-point ROS otherwise negative.  ____________________________________________   PHYSICAL EXAM:  VITAL SIGNS: ED Triage Vitals  Enc Vitals Group     BP 10/02/15 1849 140/82 mmHg     Pulse Rate 10/02/15 1849 93     Resp 10/02/15 1849 20     Temp 10/02/15 1849 97.8 F (36.6 C)     Temp Source 10/02/15 1849 Oral     SpO2 10/02/15 1849 98 %     Weight 10/02/15 1849 122 lb (55.339 kg)     Height 10/02/15 1849 5\' 6"  (1.676 m)     Head Cir --      Peak Flow --      Pain Score 10/02/15 1850 7     Pain Loc --      Pain Edu? --      Excl. in GC? --      Constitutional: Alert and oriented. Well appearing and in no acute distress. Eyes: Conjunctivae are normal. PERRL. EOMI. Head: Atraumatic. Nose: No congestion/rhinnorhea. Mouth/Throat: Mucous membranes are moist.   Neck: No stridor.   Cardiovascular: Normal rate, regular rhythm. Grossly normal heart sounds.  Good peripheral circulation. Respiratory: Normal respiratory effort.  No retractions.  Lungs CTAB. Gastrointestinal: Soft and nontender. No distention. No abdominal bruits. No CVA tenderness. Musculoskeletal: No lower extremity tenderness nor edema.  No joint effusions. Neurologic:  Normal speech and language. No gross focal neurologic deficits are appreciated. No gait instability. Skin:  Skin is warm, dry and intact. No rash noted. Psychiatric: Mood and affect are normal. Speech and behavior are normal.  ____________________________________________   LABS (all labs ordered are listed, but only abnormal results are displayed)  Labs Reviewed  BASIC METABOLIC PANEL - Abnormal; Notable for the following:    Glucose, Bld 119 (*)    All other components within normal limits  CBC  TROPONIN I  TROPONIN I   ____________________________________________  EKG  ED ECG REPORT I, Arelia Longest, the attending physician, personally viewed and interpreted this ECG.   Date: 10/02/2015  EKG Time: 1848  Rate: 97  Rhythm: normal EKG, normal sinus rhythm  Axis: Normal axis  Intervals:none  ST&T Change: No ST segment elevation or depression. No abnormal T-wave inversions.  ____________________________________________  RADIOLOGY  No acute abnormality on the chest x-ray. I personally reviewed this film. ____________________________________________   PROCEDURES   ____________________________________________   INITIAL IMPRESSION / ASSESSMENT AND PLAN / ED COURSE  Pertinent labs & imaging results that were available during my care of the patient were reviewed by me and  considered in my medical decision making (see chart for details).  ----------------------------------------- 9:59 PM on 10/02/2015 -----------------------------------------  Discussed case Dr. Duke Salvia cardiology who agrees with outpatient workup. I discussed with the patient also increasing her daily dose of Protonix to twice a day. The patient has never had a stress test but does have a family history of cardiac disease. While her history is consistent with her GERD and she has had a reassuring workup with her blood work as well as EKG I feel that she still has risk factors that would merit cardiology follow-up. The patient understands this plan and is one to comply.  ----------------------------------------- 11:12 PM on 10/02/2015 -----------------------------------------  Patient continues to be pain-free at this time. She is aware of the need for cardiology follow-up. She will increase her dose of Protonix to twice a day. ____________________________________________   FINAL CLINICAL IMPRESSION(S) / ED DIAGNOSES  Chest pain.    Myrna Blazer, MD 10/02/15 [phone]

## 2015-10-02 NOTE — ED Notes (Signed)
Pt reports intermittent central chest pain, described as burning x 1 week.  Pt reports hx MI, GERD, and COPD but unsure if any is source of pain.  PT suspected GERD and took protonix w/o relief.  Pt reports episodes come on fast and makes her feel clammy.  PT NAD at this time, respirations equal and unlabored, skin warm and dry.

## 2015-10-02 NOTE — ED Notes (Signed)
States was driving and had sudden onset of burning chest pain. Hx of mi, copd, gerd

## 2015-10-04 ENCOUNTER — Telehealth: Payer: Self-pay | Admitting: *Deleted

## 2015-10-04 NOTE — Telephone Encounter (Signed)
Lmov to schedule new patient hospital fu

## 2015-11-17 ENCOUNTER — Emergency Department
Admission: EM | Admit: 2015-11-17 | Discharge: 2015-11-17 | Disposition: A | Discharge status: Home / Self Care | Payer: Medicare Other | Attending: Emergency Medicine | Admitting: Emergency Medicine

## 2015-11-17 ENCOUNTER — Emergency Department: Payer: Medicare Other

## 2015-11-17 ENCOUNTER — Encounter: Payer: Self-pay | Admitting: *Deleted

## 2015-11-17 DIAGNOSIS — Z79899 Other long term (current) drug therapy: Secondary | ICD-10-CM | POA: Insufficient documentation

## 2015-11-17 DIAGNOSIS — Z87891 Personal history of nicotine dependence: Secondary | ICD-10-CM | POA: Insufficient documentation

## 2015-11-17 DIAGNOSIS — R05 Cough: Secondary | ICD-10-CM | POA: Diagnosis present

## 2015-11-17 DIAGNOSIS — Z7951 Long term (current) use of inhaled steroids: Secondary | ICD-10-CM | POA: Insufficient documentation

## 2015-11-17 DIAGNOSIS — J9801 Acute bronchospasm: Secondary | ICD-10-CM

## 2015-11-17 LAB — CBC
HEMATOCRIT: 41.4 % (ref 35.0–47.0)
HEMOGLOBIN: 14 g/dL (ref 12.0–16.0)
MCH: 32.3 pg (ref 26.0–34.0)
MCHC: 33.8 g/dL (ref 32.0–36.0)
MCV: 95.6 fL (ref 80.0–100.0)
Platelets: 296 10*3/uL (ref 150–440)
RBC: 4.32 MIL/uL (ref 3.80–5.20)
RDW: 13.2 % (ref 11.5–14.5)
WBC: 7.2 10*3/uL (ref 3.6–11.0)

## 2015-11-17 LAB — BASIC METABOLIC PANEL
ANION GAP: 8 (ref 5–15)
BUN: 8 mg/dL (ref 6–20)
CHLORIDE: 102 mmol/L (ref 101–111)
CO2: 28 mmol/L (ref 22–32)
Calcium: 9.4 mg/dL (ref 8.9–10.3)
Creatinine, Ser: 0.83 mg/dL (ref 0.44–1.00)
GFR calc Af Amer: >60 mL/min (ref >60)
GLUCOSE: 97 mg/dL (ref 65–99)
POTASSIUM: 4.2 mmol/L (ref 3.5–5.1)
Sodium: 138 mmol/L (ref 135–145)

## 2015-11-17 MED ORDER — PREDNISONE 10 MG PO TABS: 10.0000 mg | ORAL_TABLET | Freq: Two times a day (BID) | ORAL | Status: DC

## 2015-11-17 MED ORDER — ALBUTEROL SULFATE (2.5 MG/3ML) 0.083% IN NEBU
5.0000 mg | INHALATION_SOLUTION | Freq: Once | RESPIRATORY_TRACT | Status: AC
  Administered 2015-11-17: 5 mg via RESPIRATORY_TRACT
  Filled 2015-11-17: qty 6

## 2015-11-17 MED ORDER — GUAIFENESIN-CODEINE 100-10 MG/5ML PO SOLN: 5.0000 mL | Freq: Three times a day (TID) | ORAL | Status: DC | PRN

## 2015-11-17 NOTE — ED Provider Notes (Signed)
[facility] Emergency Department Provider Note ____________________________________________  Time seen: 1513  I have reviewed the triage vital signs and the nursing notes.  HISTORY  Chief Complaint  Cough and Shortness of Breath  HPI Heidi [name] is a 60 y.o. female to the ED for evaluation and treatment of complaints including dry cough. She describes a history of emphysema and was recently treated with Jerilynn Som and an a Z-pack. She complains of ongoing cough and chest discomfort related to the coughing. She denies any interim fevers, chills, sweats. She also denies any shortness of breath, or wheezing.She rates the discomfort in her chest wall at a 6/10 in triage.  Past Medical History  Diagnosis Date  . COPD (chronic obstructive pulmonary disease) (HCC)   . NSTEMI (non-ST elevated myocardial infarction) (HCC)     12/2011 with normal coronaries possibly secondary to Takotsubo (EF 30-35% by echo), occurred following two episodes of acute respiratory distress  . Hypotension     limiting med titration  . 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 GERD (gastroesophageal reflux disease)     Patient Active Problem List   Diagnosis Date Noted  . CAP (community acquired pneumonia) 01/05/2015  . Ex-smoker 07/14/2014  . Allergic rhinitis 03/03/2014  . Anxiety 04/10/2013  . Hoarseness 10/10/2012  . Acute respiratory failure (HCC) 08/08/2012  . Acute encephalopathy 08/08/2012  . COPD with acute exacerbation (HCC) 08/08/2012  . Lactic acidosis 08/08/2012  . Chronic respiratory failure (HCC) 03/27/2012  . Hx of MI < 8 weeks 01/31/2012  . Takotsubo cardiomyopathy 01/31/2012  . Chest pain 01/23/2012  . TINGLING 05/26/2010  . SKIN LESION 11/08/2009  . ASCUS PAP 08/02/2009  . VAG HIGH RISK HUMAN PAPILLOMAVIRUS DNA TEST POS 08/02/2009  . DEPRESSION, CHRONIC with anxiety 06/22/2009  . MICROSCOPIC HEMATURIA 06/22/2009  . Smoking history 04/06/2009  . BRUISE 04/06/2009  .  URI 10/09/2007  . HYSTERECTOMY, PARTIAL, HX OF 07/01/2007  . COPD GOLD III 06/18/2007  . OSTEOPENIA 06/18/2007  . 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 Abdominal hysterectomy    . Vagina rectal repair      after childbirth  . Cardiac catheterization    . Left heart catheterization with coronary angiogram N/A 01/23/2012    Procedure: LEFT HEART CATHETERIZATION WITH CORONARY ANGIOGRAM;  Surgeon: Dolores Patty, MD;  Location: [facility] CATH LAB;  Service: Cardiovascular;  Laterality: N/A;    Current Outpatient Rx  Name  Route  Sig  Dispense  Refill  . albuterol (PROVENTIL) (2.5 MG/3ML) 0.083% nebulizer solution   Nebulization   Take 3 mLs (2.5 mg total) by nebulization every 6 (six) hours as needed for wheezing or shortness of breath.   360 mL   3   . ALPRAZolam (XANAX) 0.25 MG tablet   Oral   Take 0.25 mg by mouth at bedtime as needed for anxiety.         . budesonide-formoterol (SYMBICORT) 160-4.5 MCG/ACT inhaler   Inhalation   Inhale 2 puffs into the lungs 2 (two) times daily.   1 Inhaler   6   . escitalopram (LEXAPRO) 5 MG tablet   Oral   Take 20 mg by mouth daily.          . fluticasone (FLONASE) 50 MCG/ACT nasal spray   Each Nare   Place 2 sprays into both nostrils daily.   16 g   5   . guaiFENesin-codeine 100-10 MG/5ML syrup   Oral  Take 5 mLs by mouth 3 (three) times daily as needed for cough.   100 mL   0   . ipratropium (ATROVENT) 0.02 % nebulizer solution   Nebulization   Take 2.5 mLs (0.5 mg total) by nebulization every 6 (six) hours as needed for wheezing or shortness of breath. Dx Code J43.2   360 mL   3   . Ipratropium-Albuterol (COMBIVENT RESPIMAT) 20-100 MCG/ACT AERS respimat   Inhalation   Inhale 1 puff into the lungs every 6 (six) hours as needed for wheezing.   1 Inhaler   5   . pantoprazole (PROTONIX) 40 MG tablet   Oral   Take 40 mg by mouth 2 (two) times daily. Take 30-60 min  before first meal of the day         . predniSONE (DELTASONE) 10 MG tablet   Oral   Take 1 tablet (10 mg total) by mouth 2 (two) times daily with a meal.   10 tablet   0   . PROAIR HFA 108 (90 BASE) MCG/ACT inhaler      INHALE TWO PUFFS BY MOUTH 4 TIMES DAILY AS NEEDED   3 each   0    Allergies Codeine; Levaquin; and Zoloft  Family History  Problem Relation Age of Onset  . Heart attack Mother     deceased from massive MI at age 64  . Liver disease Father     fatty liver; living, age 16  . Stroke Brother     at age 38, living   . Hypertension Brother     living, age 59    Social History Social History  Substance Use Topics  . Smoking status: Former Smoker -- 1.00 packs/day for 30 years    Types: Cigarettes    Quit date: 01/21/2012  . Smokeless tobacco: Never Used  . Alcohol Use: Yes     Comment: 1-2 beers per week   Review of Systems  Constitutional: Negative for fever. Eyes: Negative for visual changes. ENT: Negative for sore throat. Cardiovascular: Negative for chest pain. Respiratory: Negative for shortness of breath. Reports dry cough and chest wall pain secondary to cough. Gastrointestinal: Negative for abdominal pain, vomiting and diarrhea. Genitourinary: Negative for dysuria. Musculoskeletal: Negative for back pain. Skin: Negative for rash. Neurological: Negative for headaches, focal weakness or numbness. ____________________________________________  PHYSICAL EXAM:  VITAL SIGNS: ED Triage Vitals  Enc Vitals Group     BP 11/17/15 1359 148/83 mmHg     Pulse Rate 11/17/15 1359 99     Resp 11/17/15 1359 18     Temp 11/17/15 1359 99.4 F (37.4 C)     Temp Source 11/17/15 1359 Oral     SpO2 11/17/15 1359 95 %     Weight 11/17/15 1359 125 lb (56.7 kg)     Height 11/17/15 1359  (1.676 m)     Head Cir --      Peak Flow --      Pain Score 11/17/15 1359 4     Pain Loc --      Pain Edu? --      Excl. in GC? --    Constitutional: Alert and  oriented. Well appearing and in no distress. Head: Normocephalic and atraumatic.      Eyes: Conjunctivae are normal. PERRL. Normal extraocular movements      Ears: Canals clear. TMs intact bilaterally.   Nose: No congestion/rhinorrhea.   Mouth/Throat: Mucous membranes are moist.   Neck: Supple. No thyromegaly.  Hematological/Lymphatic/Immunological: No cervical lymphadenopathy. Cardiovascular: Normal rate, regular rhythm.  Respiratory: Normal respiratory effort. No wheezes/rales/rhonchi. Gastrointestinal: Soft and nontender. No distention. Musculoskeletal: Nontender with normal range of motion in all extremities.  Neurologic:  Normal gait without ataxia. Normal speech and language. No gross focal neurologic deficits are appreciated. Skin:  Skin is warm, dry and intact. No rash noted. Psychiatric: Mood and affect are normal. Patient exhibits appropriate insight and judgment. ____________________________________________   LABS (pertinent positives/negatives) Labs Reviewed  BASIC METABOLIC PANEL  CBC  ____________________________________________  ED ECG REPORT   Date: 11/18/2015  EKG Time: 13:57  Rate: 98  Rhythm: normal sinus rhythm,  normal EKG, normal sinus rhythm, unchanged from previous tracings  Axis: normal  Intervals:none  ST&T Change: none  Narrative Interpretation: normal ____________________________________________   RADIOLOGY CXR  IMPRESSION: Stable chronic lung disease. No active disease. ____________________________________________  PROCEDURES  DuoNeb x 1 ____________________________________________  INITIAL IMPRESSION / ASSESSMENT AND PLAN / ED COURSE  Patient with negative chest x-ray for any indication of an acute lung infection. She is likely experiencing some bronchospasm due to her underlying emphysema. She be discharged with a prescription for ultrasound tablets and guaifenesin-codeine syrup. She'll follow with the primary care provider for  ongoing symptoms. ____________________________________________  FINAL CLINICAL IMPRESSION(S) / ED DIAGNOSES  Final diagnoses:  Bronchospasm      Lissa Hoard, PA-C 11/18/15 0015  Sharman Cheek, MD 11/18/15 2349

## 2015-11-17 NOTE — Discharge Instructions (Signed)
Bronchospasm, Adult  A bronchospasm is a spasm or tightening of the airways going into the lungs. During a bronchospasm breathing becomes more difficult because the airways get smaller. When this happens there can be coughing, a whistling sound when breathing (wheezing), and difficulty breathing. Bronchospasm is often associated with asthma, but not all patients who experience a bronchospasm have asthma.  CAUSES   A bronchospasm is caused by inflammation or irritation of the airways. The inflammation or irritation may be triggered by:   · Allergies (such as to animals, pollen, food, or mold). Allergens that cause bronchospasm may cause wheezing immediately after exposure or many hours later.    · Infection. Viral infections are believed to be the most common cause of bronchospasm.    · Exercise.    · Irritants (such as pollution, cigarette smoke, strong odors, aerosol sprays, and paint fumes).    · Weather changes. Winds increase molds and pollens in the air. Rain refreshes the air by washing irritants out. Cold air may cause inflammation.    · Stress and emotional upset.    SIGNS AND SYMPTOMS   · Wheezing.    · Excessive nighttime coughing.    · Frequent or severe coughing with a simple cold.    · Chest tightness.    · Shortness of breath.    DIAGNOSIS   Bronchospasm is usually diagnosed through a history and physical exam. Tests, such as chest X-rays, are sometimes done to look for other conditions.  TREATMENT   · Inhaled medicines can be given to open up your airways and help you breathe. The medicines can be given using either an inhaler or a nebulizer machine.  · Corticosteroid medicines may be given for severe bronchospasm, usually when it is associated with asthma.  HOME CARE INSTRUCTIONS   · Always have a plan prepared for seeking medical care. Know when to call your health care provider and local emergency services (911 in the [place] Know where you can access local emergency care.  · Only take medicines as  directed by your health care provider.  · If you were prescribed an inhaler or nebulizer machine, ask your health care provider to explain how to use it correctly. Always use a spacer with your inhaler if you were given one.  · It is necessary to remain calm during an attack. Try to relax and breathe more slowly.   · Control your home environment in the following ways:      Change your heating and air conditioning filter at least once a month.      Limit your use of fireplaces and wood stoves.    Do not smoke and do not allow smoking in your home.      Avoid exposure to perfumes and fragrances.      Get rid of pests (such as roaches and mice) and their droppings.      Throw away plants if you see mold on them.      Keep your house clean and dust free.      Replace carpet with wood, tile, or vinyl flooring. Carpet can trap dander and dust.      Use allergy-proof pillows, mattress covers, and box spring covers.      Wash bed sheets and blankets every week in hot water and dry them in a dryer.      Use blankets that are made of polyester or cotton.      Wash hands frequently.  SEEK MEDICAL CARE IF:   · You have muscle aches.    · You have chest pain.    · The sputum changes from clear or   even after taking your prescribed medicines.   You have increased difficulty breathing.   You develop severe chest pain. MAKE SURE YOU:   Understand these instructions.  Will watch your condition.  Will get help right away if you are not doing well or get worse.   This information is not intended to replace advice given to you by your health care provider. Make sure you discuss any questions you have with your health care  provider.   Document Released: 10/19/2003 Document Revised: 11/06/2014 Document Reviewed: 04/07/2013 Elsevier Interactive Patient Education [employer]   Your exam, labs, and chest x-ray are essentially normal today. Continue with your daily pulmonary medicines as previously prescribed. Take the prednisone and cough syrup as needed. Follow-up with Dr. Thedore Mins for ongoing symptoms.

## 2015-11-17 NOTE — ED Notes (Signed)
States dry cough, states she was diagnoised with emphysema and given tessalon pearls and abx but with no relief, states chest pain from all the coughing

## 2015-11-30 ENCOUNTER — Emergency Department
Admission: EM | Admit: 2015-11-30 | Discharge: 2015-11-30 | Disposition: A | Discharge status: Home / Self Care | Payer: Medicare Other | Attending: Emergency Medicine | Admitting: Emergency Medicine

## 2015-11-30 ENCOUNTER — Emergency Department: Payer: Medicare Other

## 2015-11-30 ENCOUNTER — Telehealth: Payer: Self-pay | Admitting: Pulmonary Disease

## 2015-11-30 ENCOUNTER — Encounter: Payer: Self-pay | Admitting: *Deleted

## 2015-11-30 DIAGNOSIS — J449 Chronic obstructive pulmonary disease, unspecified: Secondary | ICD-10-CM

## 2015-11-30 DIAGNOSIS — Z7951 Long term (current) use of inhaled steroids: Secondary | ICD-10-CM | POA: Insufficient documentation

## 2015-11-30 DIAGNOSIS — R55 Syncope and collapse: Secondary | ICD-10-CM | POA: Diagnosis present

## 2015-11-30 DIAGNOSIS — Z79899 Other long term (current) drug therapy: Secondary | ICD-10-CM | POA: Diagnosis not present

## 2015-11-30 DIAGNOSIS — F419 Anxiety disorder, unspecified: Secondary | ICD-10-CM | POA: Diagnosis not present

## 2015-11-30 DIAGNOSIS — Z7952 Long term (current) use of systemic steroids: Secondary | ICD-10-CM | POA: Insufficient documentation

## 2015-11-30 DIAGNOSIS — Z87891 Personal history of nicotine dependence: Secondary | ICD-10-CM | POA: Insufficient documentation

## 2015-11-30 LAB — URINALYSIS COMPLETE WITH MICROSCOPIC (ARMC ONLY)
Bilirubin Urine: NEGATIVE
Glucose, UA: NEGATIVE mg/dL
KETONES UR: NEGATIVE mg/dL
Nitrite: NEGATIVE
PH: 6 (ref 5.0–8.0)
PROTEIN: NEGATIVE mg/dL
Specific Gravity, Urine: 1.002 — ABNORMAL LOW (ref 1.005–1.030)

## 2015-11-30 LAB — BASIC METABOLIC PANEL
ANION GAP: 6 (ref 5–15)
BUN: 9 mg/dL (ref 6–20)
CALCIUM: 9.5 mg/dL (ref 8.9–10.3)
CO2: 27 mmol/L (ref 22–32)
Chloride: 107 mmol/L (ref 101–111)
Creatinine, Ser: 0.7 mg/dL (ref 0.44–1.00)
GLUCOSE: 97 mg/dL (ref 65–99)
POTASSIUM: 3.9 mmol/L (ref 3.5–5.1)
Sodium: 140 mmol/L (ref 135–145)

## 2015-11-30 LAB — CBC
HEMATOCRIT: 41.5 % (ref 35.0–47.0)
Hemoglobin: 14 g/dL (ref 12.0–16.0)
MCH: 32.3 pg (ref 26.0–34.0)
MCHC: 33.8 g/dL (ref 32.0–36.0)
MCV: 95.4 fL (ref 80.0–100.0)
Platelets: 294 10*3/uL (ref 150–440)
RBC: 4.35 MIL/uL (ref 3.80–5.20)
RDW: 12.9 % (ref 11.5–14.5)
WBC: 5.9 10*3/uL (ref 3.6–11.0)

## 2015-11-30 LAB — TROPONIN I: Troponin I: <0.03 ng/mL (ref <0.031)

## 2015-11-30 NOTE — Discharge Instructions (Signed)

## 2015-11-30 NOTE — ED Provider Notes (Signed)
[facility] Emergency Department Provider Note  ____________________________________________  Time seen: Approximately 9 PM  I have reviewed the triage vital signs and the nursing notes.   HISTORY  Chief Complaint Loss of Consciousness    HPI Heidi [name] is a 60 y.o. female with a history of and a non-ST elevation MI in 2013 secondary to Takotsubo who is presenting today with multiple episodes of near syncope. She says that she has been feeling quite anxious lately ever since her son was released from jail and she had another relative go to jail. She said that starting this past Friday while anxious she was having spells of several minutes where she was having difficulty breathing and then felt like she was going to pass out. She said that she has had an episode each day including today. She said that also this past Saturday she had a burning and central chest pain which lasted all day which then resolved spontaneously. It was nonradiating. It was not associated with the shortness of breath.She denies any symptoms at this time. She denies any homicidal or suicidal ideation.   Past Medical History  Diagnosis Date  . COPD (chronic obstructive pulmonary disease) (HCC)   . NSTEMI (non-ST elevated myocardial infarction) (HCC)     12/2011 with normal coronaries possibly secondary to Takotsubo (EF 30-35% by echo), occurred following two episodes of acute respiratory distress  . Hypotension     limiting med titration  . 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 GERD (gastroesophageal reflux disease)     Patient Active Problem List   Diagnosis Date Noted  . CAP (community acquired pneumonia) 01/05/2015  . Ex-smoker 07/14/2014  . Allergic rhinitis 03/03/2014  . Anxiety 04/10/2013  . Hoarseness 10/10/2012  . Acute respiratory failure (HCC) 08/08/2012  . Acute encephalopathy 08/08/2012  . COPD with acute exacerbation (HCC) 08/08/2012  . Lactic acidosis 08/08/2012  . Chronic  respiratory failure (HCC) 03/27/2012  . Hx of MI < 8 weeks 01/31/2012  . Takotsubo cardiomyopathy 01/31/2012  . Chest pain 01/23/2012  . TINGLING 05/26/2010  . SKIN LESION 11/08/2009  . ASCUS PAP 08/02/2009  . VAG HIGH RISK HUMAN PAPILLOMAVIRUS DNA TEST POS 08/02/2009  . DEPRESSION, CHRONIC with anxiety 06/22/2009  . MICROSCOPIC HEMATURIA 06/22/2009  . Smoking history 04/06/2009  . BRUISE 04/06/2009  . URI 10/09/2007  . HYSTERECTOMY, PARTIAL, HX OF 07/01/2007  . COPD GOLD III 06/18/2007  . OSTEOPENIA 06/18/2007  . 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 Abdominal hysterectomy    . Vagina rectal repair      after childbirth  . Cardiac catheterization    . Left heart catheterization with coronary angiogram N/A 01/23/2012    Procedure: LEFT HEART CATHETERIZATION WITH CORONARY ANGIOGRAM;  Surgeon: Dolores Patty, MD;  Location: [facility] CATH LAB;  Service: Cardiovascular;  Laterality: N/A;    Current Outpatient Rx  Name  Route  Sig  Dispense  Refill  . albuterol (PROVENTIL) (2.5 MG/3ML) 0.083% nebulizer solution   Nebulization   Take 3 mLs (2.5 mg total) by nebulization every 6 (six) hours as needed for wheezing or shortness of breath.   360 mL   3   . ALPRAZolam (XANAX) 0.25 MG tablet   Oral   Take 0.25 mg by mouth at bedtime as needed for anxiety.         . budesonide-formoterol (SYMBICORT) 160-4.5 MCG/ACT inhaler   Inhalation   Inhale 2 puffs into the lungs  2 (two) times daily.   1 Inhaler   6   . escitalopram (LEXAPRO) 5 MG tablet   Oral   Take 20 mg by mouth daily.          . fluticasone (FLONASE) 50 MCG/ACT nasal spray   Each Nare   Place 2 sprays into both nostrils daily.   16 g   5   . guaiFENesin-codeine 100-10 MG/5ML syrup   Oral   Take 5 mLs by mouth 3 (three) times daily as needed for cough.   100 mL   0   . ipratropium (ATROVENT) 0.02 % nebulizer solution   Nebulization   Take 2.5 mLs (0.5  mg total) by nebulization every 6 (six) hours as needed for wheezing or shortness of breath. Dx Code J43.2   360 mL   3   . Ipratropium-Albuterol (COMBIVENT RESPIMAT) 20-100 MCG/ACT AERS respimat   Inhalation   Inhale 1 puff into the lungs every 6 (six) hours as needed for wheezing.   1 Inhaler   5   . pantoprazole (PROTONIX) 40 MG tablet   Oral   Take 40 mg by mouth 2 (two) times daily. Take 30-60 min before first meal of the day         . predniSONE (DELTASONE) 10 MG tablet   Oral   Take 1 tablet (10 mg total) by mouth 2 (two) times daily with a meal.   10 tablet   0   . PROAIR HFA 108 (90 BASE) MCG/ACT inhaler      INHALE TWO PUFFS BY MOUTH 4 TIMES DAILY AS NEEDED   3 each   0     Allergies Codeine; Levaquin; and Zoloft  Family History  Problem Relation Age of Onset  . Heart attack Mother     deceased from massive MI at age 39  . Liver disease Father     fatty liver; living, age 51  . Stroke Brother     at age 60, living   . Hypertension Brother     living, age 78    Social History Social History  Substance Use Topics  . Smoking status: Former Smoker -- 1.00 packs/day for 30 years    Types: Cigarettes    Quit date: 01/21/2012  . Smokeless tobacco: Never Used  . Alcohol Use: Yes     Comment: 1-2 beers per week    Review of Systems Constitutional: No fever/chills Eyes: No visual changes. ENT: No sore throat. Cardiovascular: As above  Respiratory: As above Gastrointestinal: No abdominal pain.  No nausea, no vomiting.  No diarrhea.  No constipation. Genitourinary: Negative for dysuria. Musculoskeletal: Negative for back pain. Skin: Negative for rash. Neurological: Negative for headaches, focal weakness or numbness.  10-point ROS otherwise negative.  ____________________________________________   PHYSICAL EXAM:  VITAL SIGNS: ED Triage Vitals  Enc Vitals Group     BP 11/30/15 1322 149/85 mmHg     Pulse Rate 11/30/15 1322 99     Resp  11/30/15 1322 18     Temp 11/30/15 1322 97.8 F (36.6 C)     Temp Source 11/30/15 1322 Oral     SpO2 11/30/15 1322 99 %     Weight 11/30/15 1322 125 lb (56.7 kg)     Height 11/30/15 1322  (1.676 m)     Head Cir --      Peak Flow --      Pain Score 11/30/15 1323 0     Pain Loc --  Pain Edu? --      Excl. in GC? --     Constitutional: Alert and oriented. Well appearing and in no acute distress. Eyes: Conjunctivae are normal. PERRL. EOMI. Head: Atraumatic. Nose: No congestion/rhinnorhea. Mouth/Throat: Mucous membranes are moist.  Neck: No stridor.   Cardiovascular: Normal rate, regular rhythm. Grossly normal heart sounds.  Good peripheral circulation. Respiratory: Normal respiratory effort.  No retractions. Lungs CTAB. Gastrointestinal: Soft and nontender. No distention. No abdominal bruits. No CVA tenderness. Musculoskeletal: No lower extremity tenderness nor edema.  No joint effusions. Neurologic:  Normal speech and language. No gross focal neurologic deficits are appreciated. No gait instability. Skin:  Skin is warm, dry and intact. No rash noted. Psychiatric: Mood and affect are normal. Speech and behavior are normal.  ____________________________________________   LABS (all labs ordered are listed, but only abnormal results are displayed)  Labs Reviewed  URINALYSIS COMPLETEWITH MICROSCOPIC ([facility] ONLY) - Abnormal; Notable for the following:    Color, Urine STRAW (*)    APPearance CLEAR (*)    Specific Gravity, Urine 1.002 (*)    Hgb urine dipstick 1+ (*)    Leukocytes, UA TRACE (*)    Bacteria, UA RARE (*)    Squamous Epithelial / LPF 0-5 (*)    All other components within normal limits  BASIC METABOLIC PANEL  CBC  TROPONIN I  CBG MONITORING, ED   ____________________________________________  EKG  ED ECG REPORT I, Arelia Longest, the attending physician, personally viewed and interpreted this ECG.   Date: 11/30/2015  EKG Time: 1327  Rate: 96   Rhythm: normal EKG, normal sinus rhythm  Axis: Normal axis  Intervals:none  ST&T Change: No ST segment elevation or depression. No abnormal T-wave inversion.  ____________________________________________  RADIOLOGY  Emphysema without acute superimposed finding. ____________________________________________   PROCEDURES   ____________________________________________   INITIAL IMPRESSION / ASSESSMENT AND PLAN / ED COURSE  Pertinent labs & imaging results that were available during my care of the patient were reviewed by me and considered in my medical decision making (see chart for details).  ----------------------------------------- 9:36 PM on 11/30/2015 -----------------------------------------  Patient is resting comfortably at this time with reassuring workup. A symptomatically at this time. Has appointment tomorrow with both her primary care doctor and a cardiologist. Will be discharged to home. Possibly anxiety related. No organic cause obviously evident on workup today. ____________________________________________   FINAL CLINICAL IMPRESSION(S) / ED DIAGNOSES  Near-syncope.    Myrna Blazer, MD 11/30/15 2137

## 2015-11-30 NOTE — Telephone Encounter (Signed)
Patient Returned call [phone]

## 2015-11-30 NOTE — ED Notes (Addendum)
Pt states "pass out spells", states she just feels like she is going to pass out but never does, states hx of emphysema, awake and alert, ambulatory upon arrival, states she has been feeling really anxious lately, denies any pain

## 2015-11-30 NOTE — Telephone Encounter (Signed)
Called spoke with pt. She is wanting Dr. Kendrick Fries to place an order so she can attend the "lung works" program at [facility]. Please advise Dr. Kendrick Fries if okay to do so? thanks

## 2015-11-30 NOTE — ED Notes (Signed)
Pt states she feels like she is going to pass out.  Sx for 5 days.  No chest pain.  Pt states i feel anxious.  Hx copd.  No n/v/d.  Pt alert.  Skin warm and dry.  nsr on monitor.

## 2015-11-30 NOTE — Telephone Encounter (Signed)
[id number] x 1 

## 2015-12-01 ENCOUNTER — Encounter: Payer: Self-pay | Admitting: Cardiovascular Disease

## 2015-12-01 ENCOUNTER — Ambulatory Visit (INDEPENDENT_AMBULATORY_CARE_PROVIDER_SITE_OTHER): Payer: Medicare Other | Admitting: Cardiovascular Disease

## 2015-12-01 VITALS — BP 114/74 | HR 102 | Ht 66.0 in | Wt 124.5 lb

## 2015-12-01 DIAGNOSIS — I5181 Takotsubo syndrome: Secondary | ICD-10-CM

## 2015-12-01 DIAGNOSIS — J441 Chronic obstructive pulmonary disease with (acute) exacerbation: Secondary | ICD-10-CM

## 2015-12-01 DIAGNOSIS — R55 Syncope and collapse: Secondary | ICD-10-CM | POA: Diagnosis not present

## 2015-12-01 DIAGNOSIS — R000 Tachycardia, unspecified: Secondary | ICD-10-CM | POA: Diagnosis not present

## 2015-12-01 DIAGNOSIS — R079 Chest pain, unspecified: Secondary | ICD-10-CM

## 2015-12-01 DIAGNOSIS — I700 Atherosclerosis of aorta: Secondary | ICD-10-CM | POA: Insufficient documentation

## 2015-12-01 DIAGNOSIS — F419 Anxiety disorder, unspecified: Secondary | ICD-10-CM

## 2015-12-01 NOTE — Assessment & Plan Note (Signed)
Significant stressors at home, notes indicating close family members among other family members going to or coming out of prison. Previously on Lexapro, this has been restarted by outside physician. Received prescription today

## 2015-12-01 NOTE — Patient Instructions (Addendum)
No medication changes were made.  Ok to start the lexapro We will wait for the medication to work  Please call if you have more episodes of tachycardia We would order a 30 day monitor  Please call us if you have new issues that need to be addressed before your next appt.   Cardiac Event Monitoring A cardiac event monitor is a small recording device used to help detect abnormal heart rhythms (arrhythmias). The monitor is used to record heart rhythm when noticeable symptoms such as the following occur:  Fast heartbeats (palpitations), such as heart racing or fluttering.  Dizziness.  Fainting or light-headedness.  Unexplained weakness. The monitor is wired to two electrodes placed on your chest. Electrodes are flat, sticky disks that attach to your skin. The monitor can be worn for up to 30 days. You will wear the monitor at all times, except when bathing.  HOW TO USE YOUR CARDIAC EVENT MONITOR A technician will prepare your chest for the electrode placement. The technician will show you how to place the electrodes, how to work the monitor, and how to replace the batteries. Take time to practice using the monitor before you leave the office. Make sure you understand how to send the information from the monitor to your health care provider. This requires a telephone with a landline, not a cell phone. You need to:  Wear your monitor at all times, except when you are in water:  Do not get the monitor wet.  Take the monitor off when bathing. Do not swim or use a hot tub with it on.  Keep your skin clean. Do not put body lotion or moisturizer on your chest.  Change the electrodes daily or any time they stop sticking to your skin. You might need to use tape to keep them on.  It is possible that your skin under the electrodes could become irritated. To keep this from happening, try to put the electrodes in slightly different places on your chest. However, they must remain in the area under  your left breast and in the upper right section of your chest.  Make sure the monitor is safely clipped to your clothing or in a location close to your body that your health care provider recommends.  Press the button to record when you feel symptoms of heart trouble, such as dizziness, weakness, light-headedness, palpitations, thumping, shortness of breath, unexplained weakness, or a fluttering or racing heart. The monitor is always on and records what happened slightly before you pressed the button, so do not worry about being too late to get good information.  Keep a diary of your activities, such as walking, doing chores, and taking medicine. It is especially important to note what you were doing when you pushed the button to record your symptoms. This will help your health care provider determine what might be contributing to your symptoms. The information stored in your monitor will be reviewed by your health care provider alongside your diary entries.  Send the recorded information as recommended by your health care provider. It is important to understand that it will take some time for your health care provider to process the results.  Change the batteries as recommended by your health care provider. SEEK IMMEDIATE MEDICAL CARE IF:   You have chest pain.  You have extreme difficulty breathing or shortness of breath.  You develop a very fast heartbeat that persists.  You develop dizziness that does not go away.  You faint or constantly  feel you are about to faint.   This information is not intended to replace advice given to you by your health care provider. Make sure you discuss any questions you have with your health care provider.   Document Released: 07/25/2008 Document Revised: 11/06/2014 Document Reviewed: 04/14/2013 Elsevier Interactive Patient Education [employer]

## 2015-12-01 NOTE — Assessment & Plan Note (Signed)
Recent bronchitis symptoms improved with Z-Pak, codeine syrup She was given prednisone by the emergency room, but did not take this Decreased lung sounds throughout consistent with severe COPD, no symptoms concerning for bronchitis at this time

## 2015-12-01 NOTE — Assessment & Plan Note (Signed)
Essentially normal ejection fraction in 2014 No clinical symptoms concerning for cardiomyopathy

## 2015-12-01 NOTE — Progress Notes (Signed)
Patient ID: Heidi [name], female    DOB: 1955/11/19, 60 y.o.   MRN: [id number]  HPI Comments: Heidi [name] is a pleasant 60 year old woman with long history of smoking, severe COPD, followed by Dr. Kendrick Fries, managed on inhalers, who presents by referral from Dr. Shelbie Hutching in the [facility] emergency room, to the cardiology office in [facility] for consultation of near syncope, lightheadedness, tachycardia.  She ports that since last Friday, over the weekend she has had 6 episodes of feeling lightheadedness. She reports that it starts with exertion, she starts to feel very short of breath, high prevent late, develops tachycardia, facial flushing. She has to put a cold towel on her face, stop walking, pre-slowly, recover If she were to continue walking she feels that she would pass out from lightheadedness. She is concerned she may be having panic attacks or anxiety. Seen by outside physician today and restarted on her Lexapro, also given Ativan to take when necessary  She reports episodes of tachycardia typically last 3-5 minutes. It takes her this long to recover with nice deep breaths.  In early January she had bronchitis, treated with Z-Pak Went to the emergency room twice, most recently 11/17/2015, chest x-ray, given codeine syrup with improvement of her cough She was given prednisone but did not take this Currently denies any significant cough or sputum, feels back to her baseline, does not feel very short of breath at rest  Lab work reviewed with her showing prior lipid panel, total cholesterol in the 130 range. She's never been on statins  EKG on today's visit shows sinus tachycardia with rate 100 bpm, no significant ST or T-wave changes  Prior records reviewed with her, cardiac catheterization 01/23/2012 showing no significant coronary artery disease Echocardiogram 2014 showing normal LV function, greater than 55%  Allergies  Allergen Reactions  . Codeine Nausea Only  . Levaquin  [Levofloxacin In D5w] Hives  . Zoloft [Sertraline Hcl]     Increased anxiety    Current Outpatient Prescriptions on File Prior to Visit  Medication Sig Dispense Refill  . albuterol (PROVENTIL) (2.5 MG/3ML) 0.083% nebulizer solution Take 3 mLs (2.5 mg total) by nebulization every 6 (six) hours as needed for wheezing or shortness of breath. 360 mL 3  . ALPRAZolam (XANAX) 0.25 MG tablet Take 0.25 mg by mouth at bedtime as needed for anxiety.    . budesonide-formoterol (SYMBICORT) 160-4.5 MCG/ACT inhaler Inhale 2 puffs into the lungs 2 (two) times daily. 1 Inhaler 6  . escitalopram (LEXAPRO) 5 MG tablet Take 20 mg by mouth daily.     . fluticasone (FLONASE) 50 MCG/ACT nasal spray Place 2 sprays into both nostrils daily. 16 g 5  . ipratropium (ATROVENT) 0.02 % nebulizer solution Take 2.5 mLs (0.5 mg total) by nebulization every 6 (six) hours as needed for wheezing or shortness of breath. Dx Code J43.2 360 mL 3  . Ipratropium-Albuterol (COMBIVENT RESPIMAT) 20-100 MCG/ACT AERS respimat Inhale 1 puff into the lungs every 6 (six) hours as needed for wheezing. 1 Inhaler 5  . pantoprazole (PROTONIX) 40 MG tablet Take 40 mg by mouth 2 (two) times daily. Take 30-60 min before first meal of the day    . PROAIR HFA 108 (90 BASE) MCG/ACT inhaler INHALE TWO PUFFS BY MOUTH 4 TIMES DAILY AS NEEDED 3 each 0   No current facility-administered medications on file prior to visit.    Past Medical History  Diagnosis Date  . COPD (chronic obstructive pulmonary disease) (HCC)   . NSTEMI (non-ST  elevated myocardial infarction) (HCC)     12/2011 with normal coronaries possibly secondary to Takotsubo (EF 30-35% by echo), occurred following two episodes of acute respiratory distress  . Hypotension     limiting med titration  . 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 GERD (gastroesophageal reflux disease)     Past Surgical History  Procedure Laterality Date  . Abdominal hysterectomy    . Vagina rectal repair      after  childbirth  . Cardiac catheterization    . Left heart catheterization with coronary angiogram N/A 01/23/2012    Procedure: LEFT HEART CATHETERIZATION WITH CORONARY ANGIOGRAM;  Surgeon: Dolores Patty, MD;  Location: [facility] CATH [facility];  Service: Cardiovascular;  Laterality: N/A;    Social History  reports that she quit smoking about 3 years ago. Her smoking use included Cigarettes. She has a 30 pack-year smoking history. She has never used smokeless tobacco. She reports that she drinks alcohol. She reports that she does not use illicit drugs.  Family History family history includes Heart attack in her mother; Hypertension in her brother; Liver disease in her father; Stroke in her brother.   Review of Systems  Constitutional: Negative.   Respiratory: Positive for shortness of breath.   Cardiovascular: Positive for palpitations.  Gastrointestinal: Negative.   Musculoskeletal: Negative.   Neurological: Positive for light-headedness.  Hematological: Negative.   Psychiatric/Behavioral: The patient is nervous/anxious.   All other systems reviewed and are negative.  BP 114/74 mmHg  Pulse 102  Ht  (1.676 m)  Wt 124 lb 8 oz (56.473 kg)  BMI 20.10 kg/m2  Physical Exam  Constitutional: She is oriented to person, place, and time. She appears well-developed and well-nourished.  HENT:  Head: Normocephalic.  Nose: Nose normal.  Mouth/Throat: Oropharynx is clear and moist.  Eyes: Conjunctivae are normal. Pupils are equal, round, and reactive to light.  Neck: Normal range of motion. Neck supple. No JVD present.  Cardiovascular: Normal rate, regular rhythm, normal heart sounds and intact distal pulses.  Exam reveals no gallop and no friction rub.   No murmur heard. Pulmonary/Chest: Effort normal. No respiratory distress. She has decreased breath sounds. She has no wheezes. She has no rales. She exhibits no tenderness.  Abdominal: Soft. Bowel sounds are normal. She exhibits no distension.  There is no tenderness.  Musculoskeletal: Normal range of motion. She exhibits no edema or tenderness.  Lymphadenopathy:    She has no cervical adenopathy.  Neurological: She is alert and oriented to person, place, and time. Coordination normal.  Skin: Skin is warm and dry. No rash noted. No erythema.  Psychiatric: She has a normal mood and affect. Her behavior is normal. Judgment and thought content normal.

## 2015-12-01 NOTE — Telephone Encounter (Signed)
OK by me 

## 2015-12-01 NOTE — Addendum Note (Signed)
Addended by: Rhea Belton R on: 12/01/2015 03:10 PM   Modules accepted: Orders

## 2015-12-01 NOTE — Assessment & Plan Note (Signed)
Recent long CT scan of the chest for screening showing aortic atherosclerosis. Unable to pull up images for review. Previous cholesterol 130, currently not a diabetic, nonsmoker.   Total encounter time more than 60 minutes  Greater than 50% was spent in counseling and coordination of care with the patient

## 2015-12-01 NOTE — Assessment & Plan Note (Signed)
Patient reports 6 episodes over the past 5 days, associated with heavy exertion.  We ambulated her in the office and oxygen level stayed 94%, heart rate did increase up to more than 110 bpm. Other than her usual shortness of breath, she was a symptomatically. She feels her symptoms are secondary to anxiety. There have been significant stressors in her life, family members going to prison per the notes. She has been started on Lexapro, Ativan as needed today, she has not started. We have recommended she start these medications, let them start to work, if her symptoms of tachycardia, lightheadedness persist, recommended that she call our office. If there are symptoms of tachycardia, even monitor could be ordered. If frequent could order Holter monitor.   She has follow-up with pulmonary April 2017. Less likely ischemia causing her symptoms given recent catheterization showing no significant coronary disease

## 2015-12-01 NOTE — Telephone Encounter (Signed)
Called spoke with pt. Aware will place referral. Nothing further needed

## 2015-12-03 ENCOUNTER — Other Ambulatory Visit: Payer: Self-pay | Admitting: Student

## 2015-12-03 ENCOUNTER — Telehealth: Payer: Self-pay | Admitting: *Deleted

## 2015-12-03 DIAGNOSIS — R002 Palpitations: Secondary | ICD-10-CM

## 2015-12-03 DIAGNOSIS — R55 Syncope and collapse: Secondary | ICD-10-CM

## 2015-12-03 NOTE — Telephone Encounter (Signed)
Order placed w/ [employer] for 30 day monitor. They will contact pt today to verify address before mailing monitor to pt.

## 2015-12-03 NOTE — Telephone Encounter (Signed)
Pt calling stating she has had 2 passing out sleeps Wednesday night woke up and passed out and then yesterday again while watching TV  Not sure if she has giving the new medication some time but wanted to know what to do. Please advise.  Pt c/o Syncope: STAT if syncope occurred within 30 minutes and pt complains of lightheadedness High Priority if episode of passing out, completely, today or in last 24 hours   1. Did you pass out today? no  2. When is the last time you passed out? Yesterday.   3. Has this occurred multiple times? Yes   4. Did you have any symptoms prior to passing out? No Spoke to the doctor last when she was here and he mentioned her getting a monitor.  Please advise.  5.

## 2015-12-03 NOTE — Telephone Encounter (Signed)
Returned the patient's call regarding her pre-syncopal events and palpitations which have occurred since her visit with Dr. Mariah Milling on 12/01/2015. She had one episode early Wednesday morning around 0500 after waking from sleep. She was sitting up in bed and became lightheaded and had palpitations for about 3-5 minutes. Her symptoms were slightly relieved with a cold washcloth. She denied any actual syncopal events. Denied any nausea, vomiting, chest pain, diaphoresis, or dyspnea.  Had a repeat episode occur yesterday evening while watching television. Symptoms lasted 3-5 minutes and were relieved with a cold compress. Again, she did not have syncope with this episode, just palpitations and lightheadedness.    She has been taking her Lexapro as prescribed and takes scheduled Xanax BID.   We will proceed with a 30-day cardiac event monitor. She would like the monitor mailed to the current address she resides at which is:    [address] [place], [place] [address]  This has been listed as a temporary address in her chart. She still receives mail at the [address] address but will not be there for the next week.  Signed, Ellsworth Lennox, PA-C 12/03/2015, 1:08 PM Pager: [phone]

## 2015-12-08 ENCOUNTER — Ambulatory Visit (INDEPENDENT_AMBULATORY_CARE_PROVIDER_SITE_OTHER): Payer: Medicare Other

## 2015-12-08 DIAGNOSIS — R002 Palpitations: Secondary | ICD-10-CM

## 2015-12-08 DIAGNOSIS — R55 Syncope and collapse: Secondary | ICD-10-CM | POA: Diagnosis not present

## 2015-12-20 ENCOUNTER — Telehealth: Payer: Self-pay

## 2015-12-20 NOTE — Telephone Encounter (Signed)
Patient states that she has not yet had any symptoms since wearing the monitor and wanted to know if she could take it off sooner. Discussed with her at length the importance of continuing it as prescribed because she may have symptoms that would be missed. She verbalized understanding and stated that she would continue wearing it. Let her know to call back if she had any questions.

## 2015-12-20 NOTE — Telephone Encounter (Signed)
Left message on machine for her to call back

## 2015-12-20 NOTE — Telephone Encounter (Signed)
Pt states she has not had any spells, since she has had her heart monitor. She would like to take it off. Please advise

## 2016-01-13 ENCOUNTER — Inpatient Hospital Stay
Admission: EM | Admit: 2016-01-13 | Discharge: 2016-01-19 | DRG: 190 | Disposition: A | Discharge status: Home / Self Care | Payer: Medicare Other | Attending: Internal Medicine | Admitting: Internal Medicine

## 2016-01-13 ENCOUNTER — Encounter: Payer: Self-pay | Admitting: Emergency Medicine

## 2016-01-13 ENCOUNTER — Emergency Department: Payer: Medicare Other

## 2016-01-13 DIAGNOSIS — F329 Major depressive disorder, single episode, unspecified: Secondary | ICD-10-CM | POA: Diagnosis present

## 2016-01-13 DIAGNOSIS — I10 Essential (primary) hypertension: Secondary | ICD-10-CM | POA: Diagnosis present

## 2016-01-13 DIAGNOSIS — Z79899 Other long term (current) drug therapy: Secondary | ICD-10-CM | POA: Diagnosis not present

## 2016-01-13 DIAGNOSIS — J101 Influenza due to other identified influenza virus with other respiratory manifestations: Secondary | ICD-10-CM | POA: Diagnosis present

## 2016-01-13 DIAGNOSIS — I700 Atherosclerosis of aorta: Secondary | ICD-10-CM | POA: Diagnosis present

## 2016-01-13 DIAGNOSIS — I252 Old myocardial infarction: Secondary | ICD-10-CM

## 2016-01-13 DIAGNOSIS — J441 Chronic obstructive pulmonary disease with (acute) exacerbation: Secondary | ICD-10-CM

## 2016-01-13 DIAGNOSIS — J440 Chronic obstructive pulmonary disease with acute lower respiratory infection: Principal | ICD-10-CM | POA: Diagnosis present

## 2016-01-13 DIAGNOSIS — F418 Other specified anxiety disorders: Secondary | ICD-10-CM | POA: Diagnosis present

## 2016-01-13 DIAGNOSIS — Z888 Allergy status to other drugs, medicaments and biological substances status: Secondary | ICD-10-CM | POA: Diagnosis not present

## 2016-01-13 DIAGNOSIS — J40 Bronchitis, not specified as acute or chronic: Secondary | ICD-10-CM

## 2016-01-13 DIAGNOSIS — J111 Influenza due to unidentified influenza virus with other respiratory manifestations: Secondary | ICD-10-CM | POA: Diagnosis present

## 2016-01-13 DIAGNOSIS — F419 Anxiety disorder, unspecified: Secondary | ICD-10-CM | POA: Diagnosis present

## 2016-01-13 DIAGNOSIS — R000 Tachycardia, unspecified: Secondary | ICD-10-CM | POA: Diagnosis present

## 2016-01-13 DIAGNOSIS — K219 Gastro-esophageal reflux disease without esophagitis: Secondary | ICD-10-CM | POA: Diagnosis present

## 2016-01-13 DIAGNOSIS — Z885 Allergy status to narcotic agent status: Secondary | ICD-10-CM

## 2016-01-13 DIAGNOSIS — Z87891 Personal history of nicotine dependence: Secondary | ICD-10-CM

## 2016-01-13 DIAGNOSIS — J9601 Acute respiratory failure with hypoxia: Secondary | ICD-10-CM | POA: Diagnosis present

## 2016-01-13 DIAGNOSIS — R0602 Shortness of breath: Secondary | ICD-10-CM

## 2016-01-13 DIAGNOSIS — R0902 Hypoxemia: Secondary | ICD-10-CM

## 2016-01-13 DIAGNOSIS — Z9981 Dependence on supplemental oxygen: Secondary | ICD-10-CM

## 2016-01-13 DIAGNOSIS — J449 Chronic obstructive pulmonary disease, unspecified: Secondary | ICD-10-CM | POA: Diagnosis present

## 2016-01-13 LAB — BASIC METABOLIC PANEL
Anion gap: 11 (ref 5–15)
BUN: 9 mg/dL (ref 6–20)
CO2: 24 mmol/L (ref 22–32)
CREATININE: 0.59 mg/dL (ref 0.44–1.00)
Calcium: 9 mg/dL (ref 8.9–10.3)
Chloride: 94 mmol/L — ABNORMAL LOW (ref 101–111)
GFR calc Af Amer: >60 mL/min (ref >60)
GLUCOSE: 133 mg/dL — AB (ref 65–99)
Potassium: 4.1 mmol/L (ref 3.5–5.1)
SODIUM: 129 mmol/L — AB (ref 135–145)

## 2016-01-13 LAB — CBC WITH DIFFERENTIAL/PLATELET
Basophils Absolute: 0 10*3/uL (ref 0–0.1)
Basophils Relative: 0 %
Eosinophils Absolute: 0 10*3/uL (ref 0–0.7)
Eosinophils Relative: 0 %
HCT: 44.5 % (ref 35.0–47.0)
Hemoglobin: 15.1 g/dL (ref 12.0–16.0)
Lymphocytes Relative: 11 %
Lymphs Abs: 1 10*3/uL (ref 1.0–3.6)
MCH: 32.3 pg (ref 26.0–34.0)
MCHC: 34 g/dL (ref 32.0–36.0)
MCV: 95 fL (ref 80.0–100.0)
Monocytes Absolute: 0.6 10*3/uL (ref 0.2–0.9)
Monocytes Relative: 7 %
Neutro Abs: 7.7 10*3/uL — ABNORMAL HIGH (ref 1.4–6.5)
Neutrophils Relative %: 82 %
Platelets: 235 10*3/uL (ref 150–440)
RBC: 4.69 MIL/uL (ref 3.80–5.20)
RDW: 13.2 % (ref 11.5–14.5)
WBC: 9.3 10*3/uL (ref 3.6–11.0)

## 2016-01-13 LAB — TROPONIN I: Troponin I: <0.03 ng/mL (ref <0.031)

## 2016-01-13 LAB — BRAIN NATRIURETIC PEPTIDE: B Natriuretic Peptide: 171 pg/mL — ABNORMAL HIGH (ref 0.0–100.0)

## 2016-01-13 MED ORDER — ALBUTEROL SULFATE (2.5 MG/3ML) 0.083% IN NEBU
2.5000 mg | INHALATION_SOLUTION | Freq: Four times a day (QID) | RESPIRATORY_TRACT | Status: DC | PRN
Start: 2016-01-13 — End: 2016-01-19

## 2016-01-13 MED ORDER — IPRATROPIUM BROMIDE 0.02 % IN SOLN: 0.5000 mg | Freq: Four times a day (QID) | RESPIRATORY_TRACT | Status: DC | PRN

## 2016-01-13 MED ORDER — AMLODIPINE BESYLATE 5 MG PO TABS
2.5000 mg | ORAL_TABLET | Freq: Every day | ORAL | Status: DC
  Administered 2016-01-13 – 2016-01-16 (×4): 2.5 mg via ORAL
  Filled 2016-01-13 (×4): qty 1

## 2016-01-13 MED ORDER — SENNOSIDES-DOCUSATE SODIUM 8.6-50 MG PO TABS: 1.0000 | ORAL_TABLET | Freq: Every evening | ORAL | Status: DC | PRN

## 2016-01-13 MED ORDER — ALPRAZOLAM 0.25 MG PO TABS
0.2500 mg | ORAL_TABLET | Freq: Every evening | ORAL | Status: DC | PRN
  Administered 2016-01-13: 0.25 mg via ORAL
  Filled 2016-01-13: qty 1

## 2016-01-13 MED ORDER — IPRATROPIUM-ALBUTEROL 20-100 MCG/ACT IN AERS: 1.0000 | INHALATION_SPRAY | Freq: Four times a day (QID) | RESPIRATORY_TRACT | Status: DC | PRN

## 2016-01-13 MED ORDER — OSELTAMIVIR PHOSPHATE 75 MG PO CAPS
75.0000 mg | ORAL_CAPSULE | Freq: Once | ORAL | Status: AC
  Administered 2016-01-13: 75 mg via ORAL
  Filled 2016-01-13: qty 1

## 2016-01-13 MED ORDER — BENZONATATE 100 MG PO CAPS
100.0000 mg | ORAL_CAPSULE | Freq: Three times a day (TID) | ORAL | Status: DC | PRN
  Administered 2016-01-13: 20:00:00 100 mg via ORAL
  Filled 2016-01-13: qty 1

## 2016-01-13 MED ORDER — IPRATROPIUM-ALBUTEROL 0.5-2.5 (3) MG/3ML IN SOLN
3.0000 mL | Freq: Once | RESPIRATORY_TRACT | Status: AC
  Administered 2016-01-13: 3 mL via RESPIRATORY_TRACT
  Filled 2016-01-13: qty 3

## 2016-01-13 MED ORDER — ACETAMINOPHEN 325 MG PO TABS: 650.0000 mg | ORAL_TABLET | Freq: Four times a day (QID) | ORAL | Status: DC | PRN

## 2016-01-13 MED ORDER — PANTOPRAZOLE SODIUM 40 MG PO TBEC
40.0000 mg | DELAYED_RELEASE_TABLET | Freq: Two times a day (BID) | ORAL | Status: DC
  Administered 2016-01-13 – 2016-01-19 (×13): 40 mg via ORAL
  Filled 2016-01-13 (×13): qty 1

## 2016-01-13 MED ORDER — MOMETASONE FURO-FORMOTEROL FUM 200-5 MCG/ACT IN AERO
2.0000 | INHALATION_SPRAY | Freq: Two times a day (BID) | RESPIRATORY_TRACT | Status: DC
  Administered 2016-01-13 – 2016-01-19 (×13): 2 via RESPIRATORY_TRACT
  Filled 2016-01-13: qty 8.8

## 2016-01-13 MED ORDER — ESCITALOPRAM OXALATE 10 MG PO TABS
5.0000 mg | ORAL_TABLET | Freq: Every day | ORAL | Status: DC
  Administered 2016-01-13 – 2016-01-19 (×7): 5 mg via ORAL
  Filled 2016-01-13 (×7): qty 1

## 2016-01-13 MED ORDER — ONDANSETRON HCL 4 MG PO TABS: 4.0000 mg | ORAL_TABLET | Freq: Four times a day (QID) | ORAL | Status: DC | PRN

## 2016-01-13 MED ORDER — IPRATROPIUM-ALBUTEROL 0.5-2.5 (3) MG/3ML IN SOLN
3.0000 mL | RESPIRATORY_TRACT | Status: DC
  Administered 2016-01-13 – 2016-01-19 (×34): 3 mL via RESPIRATORY_TRACT
  Filled 2016-01-13 (×35): qty 3

## 2016-01-13 MED ORDER — SODIUM CHLORIDE 0.9% FLUSH
3.0000 mL | Freq: Two times a day (BID) | INTRAVENOUS | Status: DC
  Administered 2016-01-13 – 2016-01-19 (×12): 3 mL via INTRAVENOUS

## 2016-01-13 MED ORDER — OSELTAMIVIR PHOSPHATE 75 MG PO CAPS
75.0000 mg | ORAL_CAPSULE | Freq: Two times a day (BID) | ORAL | Status: DC
  Administered 2016-01-13 – 2016-01-17 (×8): 75 mg via ORAL
  Filled 2016-01-13 (×8): qty 1

## 2016-01-13 MED ORDER — ACETAMINOPHEN 650 MG RE SUPP: 650.0000 mg | Freq: Four times a day (QID) | RECTAL | Status: DC | PRN

## 2016-01-13 MED ORDER — METHYLPREDNISOLONE SODIUM SUCC 125 MG IJ SOLR
60.0000 mg | Freq: Three times a day (TID) | INTRAMUSCULAR | Status: DC
  Administered 2016-01-13 – 2016-01-14 (×3): 60 mg via INTRAVENOUS
  Filled 2016-01-13 (×3): qty 2

## 2016-01-13 MED ORDER — ONDANSETRON HCL 4 MG/2ML IJ SOLN: 4.0000 mg | Freq: Four times a day (QID) | INTRAMUSCULAR | Status: DC | PRN

## 2016-01-13 MED ORDER — ENOXAPARIN SODIUM 40 MG/0.4ML ~~LOC~~ SOLN
40.0000 mg | SUBCUTANEOUS | Status: DC
  Administered 2016-01-13 – 2016-01-18 (×6): 40 mg via SUBCUTANEOUS
  Filled 2016-01-13 (×6): qty 0.4

## 2016-01-13 MED ORDER — FLUTICASONE PROPIONATE 50 MCG/ACT NA SUSP
2.0000 | Freq: Every day | NASAL | Status: DC
  Administered 2016-01-13 – 2016-01-19 (×7): 2 via NASAL
  Filled 2016-01-13: qty 16

## 2016-01-13 MED ORDER — ALUM & MAG HYDROXIDE-SIMETH 200-200-20 MG/5ML PO SUSP: 30.0000 mL | Freq: Four times a day (QID) | ORAL | Status: DC | PRN

## 2016-01-13 MED ORDER — ALPRAZOLAM 0.5 MG PO TABS
0.5000 mg | ORAL_TABLET | Freq: Once | ORAL | Status: AC
  Administered 2016-01-13: 0.5 mg via ORAL
  Filled 2016-01-13: qty 1

## 2016-01-13 NOTE — Care Management Note (Signed)
Case Management Note  Patient Details  Name: Heidi [name] MRN: [id number] Date of Birth: 1956/02/06  Subjective/Objective:  ER M D Jonathon williams has stated pt. Continues to wheeze, and unable to talk in sentences post ER therapy.                  Action/Plan:   Expected Discharge Date:                  Expected Discharge Plan:     In-House Referral:     Discharge planning Services     Post Acute Care Choice:    Choice offered to:     DME Arranged:    DME Agency:     HH Arranged:    HH Agency:     Status of Service:     Medicare Important Message Given:    Date Medicare IM Given:    Medicare IM give by:    Date Additional Medicare IM Given:    Additional Medicare Important Message give by:     If discussed at Long Length of Stay Meetings, dates discussed:    Additional Comments:  Berna BueCheryl Reeya Bound, RN 01/13/2016, 11:23 AM

## 2016-01-13 NOTE — H&P (Signed)
[facility] [place] at [facility]   PATIENT NAME: Heidi [name]    MR#:  [id number]  DATE OF BIRTH:  21-May-1956  DATE OF ADMISSION:  01/13/2016  PRIMARY CARE PHYSICIAN: Leotis Shames, MD   REQUESTING/REFERRING PHYSICIAN: dr Mayford Knife  CHIEF COMPLAINT:   SOB  HISTORY OF PRESENT ILLNESS:  Heidi [name]  is a 60 y.o. female with a known history of COPD not on oxygen and recent diagnosis of influenza who presents with above complaint. Patient was diagnosed with influenza yesterday and started on Tamiflu. Since that time she had increasing shortness of breath and cough and wheezing. In the emergency room she was treated for COPD exacerbation. Chest x-ray shows no evidence pneumonia. She is currently on 3 L of oxygen in the ER. She does not have oxygen at home.  PAST MEDICAL HISTORY:   Past Medical History  Diagnosis Date  . COPD (chronic obstructive pulmonary disease) (HCC)   . NSTEMI (non-ST elevated myocardial infarction) (HCC)     12/2011 with normal coronaries possibly secondary to Takotsubo (EF 30-35% by echo), occurred following two episodes of acute respiratory distress  . Hypotension     limiting med titration  . 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 GERD (gastroesophageal reflux disease)     PAST SURGICAL HISTORY:   Past Surgical History  Procedure Laterality Date  . Abdominal hysterectomy    . Vagina rectal repair      after childbirth  . Cardiac catheterization    . Left heart catheterization with coronary angiogram N/A 01/23/2012    Procedure: LEFT HEART CATHETERIZATION WITH CORONARY ANGIOGRAM;  Surgeon: Dolores Patty, MD;  Location: [facility] CATH LAB;  Service: Cardiovascular;  Laterality: N/A;    SOCIAL HISTORY:   Social History  Substance Use Topics  . Smoking status: Former Smoker -- 1.00 packs/day for 30 years    Types: Cigarettes    Quit date: 01/21/2012  . Smokeless tobacco: Never Used  . Alcohol Use: Yes     Comment: 1-2 beers per week     FAMILY HISTORY:   Family History  Problem Relation Age of Onset  . Heart attack Mother     deceased from massive MI at age 26  . Liver disease Father     fatty liver; living, age 68  . Stroke Brother     at age 41, living   . Hypertension Brother     living, age 70    DRUG ALLERGIES:   Allergies  Allergen Reactions  . Codeine Nausea Only  . Levaquin [Levofloxacin In D5w] Hives  . Zoloft [Sertraline Hcl]     Increased anxiety     REVIEW OF SYSTEMS:  CONSTITUTIONAL: No fever, fatigue positive generalized weakness.  EYES: No blurred or double vision.  EARS, NOSE, AND THROAT: No tinnitus or ear pain.  RESPIRATORY: positive cough, shortness of breath, wheezing or no hemoptysis.  CARDIOVASCULAR: No chest pain, orthopnea, edema.  GASTROINTESTINAL: No nausea, vomiting, diarrhea or abdominal pain.  GENITOURINARY: No dysuria, hematuria.  ENDOCRINE: No polyuria, nocturia,  HEMATOLOGY: No anemia, easy bruising or bleeding SKIN: No rash or lesion. MUSCULOSKELETAL: No joint pain or arthritis.   NEUROLOGIC: No tingling, numbness, weakness.  PSYCHIATRY: No anxiety or depression.   MEDICATIONS AT HOME:   Prior to Admission medications   Medication Sig Start Date End Date Taking? Authorizing Provider  albuterol (PROVENTIL) (2.5 MG/3ML) 0.083% nebulizer solution Take 3 mLs (2.5 mg total) by nebulization every 6 (six) hours as needed for wheezing or  shortness of breath. 02/11/15  Yes Lupita Leashouglas B McQuaid, MD  ALPRAZolam Prudy Feeler(XANAX) 0.25 MG tablet Take 0.25 mg by mouth at bedtime as needed for anxiety.   Yes Historical Provider, MD  amLODipine (NORVASC) 2.5 MG tablet Take 1 tablet by mouth daily. Take for blood pressure greater than or equal to 140/90. 01/12/16 01/11/17 Yes Historical Provider, MD  benzonatate (TESSALON) 100 MG capsule Take 1 capsule by mouth 3 (three) times daily as needed for cough.  01/12/16 01/19/16 Yes Historical Provider, MD  budesonide-formoterol (SYMBICORT) 160-4.5  MCG/ACT inhaler Inhale 2 puffs into the lungs 2 (two) times daily. 08/13/15  Yes Lupita Leashouglas B McQuaid, MD  escitalopram (LEXAPRO) 5 MG tablet Take 5 mg by mouth daily.    Yes Historical Provider, MD  fluticasone (FLONASE) 50 MCG/ACT nasal spray Place 2 sprays into both nostrils daily. 07/07/15  Yes Lupita Leashouglas B McQuaid, MD  ipratropium (ATROVENT) 0.02 % nebulizer solution Take 2.5 mLs (0.5 mg total) by nebulization every 6 (six) hours as needed for wheezing or shortness of breath. Dx Code J43.2 08/13/15  Yes Lupita Leashouglas B McQuaid, MD  Ipratropium-Albuterol (COMBIVENT RESPIMAT) 20-100 MCG/ACT AERS respimat Inhale 1 puff into the lungs every 6 (six) hours as needed for wheezing. 08/13/15  Yes Lupita Leashouglas B McQuaid, MD  oseltamivir (TAMIFLU) 75 MG capsule Take 1 capsule by mouth 2 (two) times daily. 01/12/16 01/17/16 Yes Historical Provider, MD  pantoprazole (PROTONIX) 40 MG tablet Take 40 mg by mouth 2 (two) times daily. Take 30-60 min before first meal of the day 01/06/13  Yes Nyoka CowdenMichael B Wert, MD  PROAIR HFA 108 (90 BASE) MCG/ACT inhaler INHALE TWO PUFFS BY MOUTH 4 TIMES DAILY AS NEEDED 06/29/15  Yes Janeann ForehandJames H Hawkins Jr., MD      VITAL SIGNS:  Blood pressure 147/82, pulse 101, temperature 98.5 F (36.9 C), temperature source Axillary, resp. rate 23, height 5\' 6"  (1.676 m), weight 56.7 kg (125 lb), SpO2 94 %.  PHYSICAL EXAMINATION:  GENERAL:  60 y.o.-year-old patient lying in the bed with no acute distress.  EYES: Pupils equal, round, reactive to light and accommodation. No scleral icterus. Extraocular muscles intact.  HEENT: Head atraumatic, normocephalic. Oropharynx and nasopharynx clear.  NECK:  Supple, no jugular venous distention. No thyroid enlargement, no tenderness.  LUNGS: Decreased lung sounds throughout. Patient sounds very tight. Minimal wheezing bilaterally. No  rales,rhonchi or crepitation. No use of accessory muscles of respiration.  CARDIOVASCULAR: S1, S2 normal. No murmurs, rubs, or gallops.  ABDOMEN:  Soft, nontender, nondistended. Bowel sounds present. No organomegaly or mass.  EXTREMITIES: No pedal edema, cyanosis, or clubbing.  NEUROLOGIC: Cranial nerves II through XII are grossly intact. No focal deficits. PSYCHIATRIC: The patient is alert and oriented x 3.  SKIN: No obvious rash, lesion, or ulcer.   LABORATORY PANEL:   CBC  Recent Labs Lab 01/13/16 0950  WBC 9.3  HGB 15.1  HCT 44.5  PLT 235   ------------------------------------------------------------------------------------------------------------------  Chemistries   Recent Labs Lab 01/13/16 0950  NA 129*  K 4.1  CL 94*  CO2 24  GLUCOSE 133*  BUN 9  CREATININE 0.59  CALCIUM 9.0   ------------------------------------------------------------------------------------------------------------------  Cardiac Enzymes  Recent Labs Lab 01/13/16 0950  TROPONINI <0.03   ------------------------------------------------------------------------------------------------------------------  RADIOLOGY:  Dg Chest Port 1 View  01/13/2016  CLINICAL DATA:  Shortness of breath and chest pain EXAM: PORTABLE CHEST 1 VIEW COMPARISON:  11/30/2015 FINDINGS: Cardiac shadow is within normal limits. The lungs are hyperinflated consistent with COPD. Mild apical scarring is noted bilaterally.  No focal infiltrate or sizable effusion is seen. No bony abnormality is noted IMPRESSION: COPD without acute abnormality. Electronically Signed   By: Alcide Clever M.D.   On: 01/13/2016 10:11    EKG:   Sinus tachycardia heart rate of 118 with PVCs   IMPRESSION AND PLAN:    60 year old female with a history of COPD not requiring oxygen, stress-induced cardiomyopathy and recent diagnosis of influenza who presents with acute hypoxic respiratory failure.   1. Acute hypoxic respiratory failure: This is due to COPD exacerbation. Wean oxygen as tolerated. Plan as outlined below.   2. COPD exacerbation: Start IV steroids, DuoNeb's and  inhalers. Chest x-ray does not show pneumonia so therefore will not start antibiotics. Wean oxygen as tolerated.  3. Influenza: Continue Tamiflu. Today's date 79. Continue isolation.   4. Essential hypertension: Continue Norvasc.  5. Depression with anxiety: Continue Xanax and Lexapro.   All the records are reviewed and case discussed with ED provider. Management plans discussed with the patient and she is in agreement.  CODE STATUS: FULL  TOTAL TIME TAKING CARE OF THIS PATIENT: 50 minutes.    Debby Clyne M.D on 01/13/2016 at 11:27 AM  Between 7am to 6pm - Pager - [phone] After 6pm go to [email or web address] - password [email or web address] [facility]  [facility]  Office  [phone]  CC: Primary care physician; Leotis Shames, MD

## 2016-01-13 NOTE — Plan of Care (Signed)
Problem: Discharge Progression Outcomes Goal: Dyspnea controlled Outcome: Progressing Pt admitted today from the ED. VSS. O2 sats in the mid 90's on 2L O2 per [place]. Dyspnea on exertion. Denies pain.

## 2016-01-13 NOTE — ED Notes (Signed)
Pt presents to ED via [facility]. Per EMS pt was seen yesterday at her PCP and dx with the flu. EMS states that patient has been using her recuse inhaler with no relief. Per EMS pt was 92% on RA and went to 96% on 15 NRB. EMS states that pt received 10mg  of Albuterol, 125mg  of Solumedrol, and 1mg  of atrovent en route. Pt presents with a 20g IV to L hand.

## 2016-01-13 NOTE — ED Notes (Signed)
Report given to Jesusita Okaina C, RN.

## 2016-01-13 NOTE — ED Notes (Signed)
Dr. Mody at bedside.  

## 2016-01-13 NOTE — ED Provider Notes (Signed)
[facility] Emergency Department Provider Note  Level V caveat: Review of systems and history is limited by dyspnea   Time seen: ----------------------------------------- 9:41 AM on 01/13/2016 -----------------------------------------    I have reviewed the triage vital signs and the nursing notes.   HISTORY  Chief Complaint No chief complaint on file.    HPI Heidi [name] is a 60 y.o. female brought to the ER for severe shortness of breath. Patient states she has recently been treated for flu, has had progressively worsening shortness of breath. In route EMS gave her several breathing treatments as well as IV Solu-Medrol without any significant improvement in her symptoms.   Past Medical History  Diagnosis Date  . COPD (chronic obstructive pulmonary disease) (HCC)   . NSTEMI (non-ST elevated myocardial infarction) (HCC)     12/2011 with normal coronaries possibly secondary to Takotsubo (EF 30-35% by echo), occurred following two episodes of acute respiratory distress  . Hypotension     limiting med titration  . 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 GERD (gastroesophageal reflux disease)     Patient Active Problem List   Diagnosis Date Noted  . Near syncope 12/01/2015  . Aortic atherosclerosis (HCC) 12/01/2015  . CAP (community acquired pneumonia) 01/05/2015  . Ex-smoker 07/14/2014  . Allergic rhinitis 03/03/2014  . Anxiety 04/10/2013  . Hoarseness 10/10/2012  . Acute respiratory failure (HCC) 08/08/2012  . Acute encephalopathy 08/08/2012  . COPD with acute exacerbation (HCC) 08/08/2012  . Lactic acidosis 08/08/2012  . Chronic respiratory failure (HCC) 03/27/2012  . Takotsubo cardiomyopathy 01/31/2012  . Chest pain 01/23/2012  . TINGLING 05/26/2010  . SKIN LESION 11/08/2009  . ASCUS PAP 08/02/2009  . VAG HIGH RISK HUMAN PAPILLOMAVIRUS DNA TEST POS 08/02/2009  . DEPRESSION, CHRONIC with anxiety 06/22/2009  . MICROSCOPIC HEMATURIA 06/22/2009  .  Smoking history 04/06/2009  . BRUISE 04/06/2009  . URI 10/09/2007  . HYSTERECTOMY, PARTIAL, HX OF 07/01/2007  . COPD GOLD III 06/18/2007  . OSTEOPENIA 06/18/2007  . 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 Abdominal hysterectomy    . Vagina rectal repair      after childbirth  . Cardiac catheterization    . Left heart catheterization with coronary angiogram N/A 01/23/2012    Procedure: LEFT HEART CATHETERIZATION WITH CORONARY ANGIOGRAM;  Surgeon: Dolores Pattyaniel R Bensimhon, MD;  Location[facility] CATH LAB;  Service: Cardiovascular;  Laterality: N/A;    Allergies Codeine; Levaquin; and Zoloft  Social History Social History  Substance Use Topics  . Smoking status: Former Smoker -- 1.00 packs/day for 30 years    Types: Cigarettes    Quit date: 01/21/2012  . Smokeless tobacco: Never Used  . Alcohol Use: Yes     Comment: 1-2 beers per week    Review of Systems Constitutional: Negative for fever. Eyes: Negative for visual changes. ENT: Negative for sore throat. Cardiovascular: Negative for chest pain. Respiratory: Positive shortness of breath and cough Gastrointestinal: Negative for abdominal pain, vomiting and diarrhea. Genitourinary: Negative for dysuria. Musculoskeletal: Negative for back pain. Skin: Negative for rash. Neurological: Negative for headaches, focal weakness or numbness.  10-point ROS otherwise negative.  ____________________________________________   PHYSICAL EXAM:  VITAL SIGNS: ED Triage Vitals  Enc Vitals Group     BP --      Pulse --      Resp --      Temp --      Temp src --  SpO2 --      Weight --      Height --      Head Cir --      Peak Flow --      Pain Score --      Pain Loc --      Pain Edu? --      Excl. in GC? --     Constitutional: Alert and oriented. Moderate distress Eyes: Conjunctivae are normal. PERRL. Normal extraocular movements. ENT   Head: Normocephalic and  atraumatic.   Nose: No congestion/rhinnorhea.   Mouth/Throat: Mucous membranes are moist.   Neck: No stridor. Cardiovascular: Normal rate, regular rhythm. Normal and symmetric distal pulses are present in all extremities. No murmurs, rubs, or gallops. Respiratory: Diminished breath sounds bilaterally with wheezing and prolonged expirations Gastrointestinal: Soft and nontender. No distention. No abdominal bruits.  Musculoskeletal: Nontender with normal range of motion in all extremities. No joint effusions.  No lower extremity tenderness nor edema. Neurologic:  Normal speech and language. No gross focal neurologic deficits are appreciated.  Skin:  Skin is warm, dry and intact. No rash noted. Psychiatric: Mood and affect are normal. Speech and behavior are normal. Patient exhibits appropriate insight and judgment. ____________________________________________  EKG: Interpreted by me. Sinus tachycardia with a rate of 118 bpm, normal PR interval, wide QRS, normal QT interval. Normal axis.  ____________________________________________  ED COURSE:  Pertinent labs & imaging results that were available during my care of the patient were reviewed by me and considered in my medical decision making (see chart for details). Patient is in respiratory distress from COPD exacerbation. Patient will need nebs, possible BiPAP. ____________________________________________    LABS (pertinent positives/negatives)  Labs Reviewed  CBC WITH DIFFERENTIAL/PLATELET - Abnormal; Notable for the following:    Neutro Abs 7.7 (*)    All other components within normal limits  BASIC METABOLIC PANEL - Abnormal; Notable for the following:    Sodium 129 (*)    Chloride 94 (*)    Glucose, Bld 133 (*)    All other components within normal limits  BRAIN NATRIURETIC PEPTIDE - Abnormal; Notable for the following:    B Natriuretic Peptide 171.0 (*)    All other components within normal limits  TROPONIN I  BLOOD  GAS, VENOUS    RADIOLOGY  Chest x-ray  IMPRESSION: COPD without acute abnormality. ____________________________________________  FINAL ASSESSMENT AND PLAN  Dyspnea, COPD exacerbation  Plan: Patient with labs and imaging as dictated above. Patient with COPD exacerbation secondary to influenza. She is requiring supplemental oxygen, will benefit from observation, nebs and steroids that are scheduled. She is stable for admission.   Emily Filbert, MD   Emily Filbert, MD 01/13/16 [phone]

## 2016-01-13 NOTE — ED Notes (Signed)
Called pharmacy for tamiflu.

## 2016-01-14 LAB — CBC
HCT: 39.5 % (ref 35.0–47.0)
Hemoglobin: 13.5 g/dL (ref 12.0–16.0)
MCH: 32.4 pg (ref 26.0–34.0)
MCHC: 34 g/dL (ref 32.0–36.0)
MCV: 95.1 fL (ref 80.0–100.0)
PLATELETS: 216 10*3/uL (ref 150–440)
RBC: 4.16 MIL/uL (ref 3.80–5.20)
RDW: 12.7 % (ref 11.5–14.5)
WBC: 5 10*3/uL (ref 3.6–11.0)

## 2016-01-14 LAB — BASIC METABOLIC PANEL
Anion gap: 7 (ref 5–15)
BUN: 13 mg/dL (ref 6–20)
CALCIUM: 8.5 mg/dL — AB (ref 8.9–10.3)
CO2: 30 mmol/L (ref 22–32)
CREATININE: 0.75 mg/dL (ref 0.44–1.00)
Chloride: 90 mmol/L — ABNORMAL LOW (ref 101–111)
GFR calc Af Amer: >60 mL/min (ref >60)
Glucose, Bld: 151 mg/dL — ABNORMAL HIGH (ref 65–99)
POTASSIUM: 3.7 mmol/L (ref 3.5–5.1)
SODIUM: 127 mmol/L — AB (ref 135–145)

## 2016-01-14 MED ORDER — SODIUM CHLORIDE 0.9 % IV SOLN
INTRAVENOUS | Status: DC
  Administered 2016-01-14 – 2016-01-15 (×2): via INTRAVENOUS

## 2016-01-14 MED ORDER — HYDROCOD POLST-CPM POLST ER 10-8 MG/5ML PO SUER
5.0000 mL | Freq: Two times a day (BID) | ORAL | Status: DC
  Administered 2016-01-14 – 2016-01-19 (×11): 5 mL via ORAL
  Filled 2016-01-14 (×11): qty 5

## 2016-01-14 MED ORDER — METHYLPREDNISOLONE SODIUM SUCC 40 MG IJ SOLR
40.0000 mg | Freq: Every day | INTRAMUSCULAR | Status: DC
  Administered 2016-01-15: 09:00:00 40 mg via INTRAVENOUS
  Filled 2016-01-14: qty 1

## 2016-01-14 MED ORDER — GUAIFENESIN-DM 100-10 MG/5ML PO SYRP: 10.0000 mL | ORAL_SOLUTION | Freq: Four times a day (QID) | ORAL | Status: DC | PRN

## 2016-01-14 MED ORDER — BENZONATATE 100 MG PO CAPS
200.0000 mg | ORAL_CAPSULE | Freq: Three times a day (TID) | ORAL | Status: AC
  Administered 2016-01-14 – 2016-01-16 (×5): 200 mg via ORAL
  Filled 2016-01-14 (×6): qty 2

## 2016-01-14 MED ORDER — ZOLPIDEM TARTRATE 5 MG PO TABS
5.0000 mg | ORAL_TABLET | Freq: Every evening | ORAL | Status: DC | PRN
  Administered 2016-01-14 – 2016-01-18 (×5): 5 mg via ORAL
  Filled 2016-01-14 (×5): qty 1

## 2016-01-14 NOTE — Progress Notes (Signed)
[facility] - [place] at [facility]   PATIENT NAME: Heidi [name]    MR#:  [id number]  DATE OF BIRTH:  01/04/1956  SUBJECTIVE:  CHIEF COMPLAINT:   Chief Complaint  Patient presents with  . Shortness of Breath   -Admitted with COPD exacerbation. Also diagnosed with flu. -Complains of worsening cough. Also has shortness of breath. Not on home oxygen but currently on 2-3 L oxygen.  REVIEW OF SYSTEMS:  Review of Systems  Constitutional: Negative for fever and chills.  HENT: Negative for ear discharge, ear pain and nosebleeds.   Eyes: Negative for blurred vision, double vision and photophobia.  Respiratory: Positive for cough, shortness of breath and wheezing.   Cardiovascular: Negative for chest pain, palpitations and leg swelling.  Gastrointestinal: Negative for nausea, vomiting, abdominal pain, diarrhea and constipation.  Genitourinary: Negative for dysuria and urgency.  Musculoskeletal: Negative for myalgias and neck pain.  Neurological: Positive for weakness. Negative for dizziness, tremors, sensory change, speech change, focal weakness, seizures and headaches.  Psychiatric/Behavioral: Negative for depression.    DRUG ALLERGIES:   Allergies  Allergen Reactions  . Codeine Nausea Only  . Levaquin [Levofloxacin In D5w] Hives  . Zoloft [Sertraline Hcl]     Increased anxiety    VITALS:  Blood pressure 109/63, pulse 81, temperature 98.6 F (37 C), temperature source Oral, resp. rate 18, height  (1.676 m), weight 56.7 kg (125 lb), SpO2 92 %.  PHYSICAL EXAMINATION:  Physical Exam  GENERAL:  60 y.o.-year-old poorly nourished patient lying in the bed with no acute distress. Occasional coughing spells EYES: Pupils equal, round, reactive to light and accommodation. No scleral icterus. Extraocular muscles intact.  HEENT: Head atraumatic, normocephalic. Oropharynx and nasopharynx clear.  NECK:  Supple, no jugular venous distention. No thyroid  enlargement, no tenderness.  LUNGS: Diffuse scattered wheezing bilaterally, bibasilar rhonchi heard. No results or crepitation. No use of accessory muscles for breathing.  CARDIOVASCULAR: S1, S2 normal. No murmurs, rubs, or gallops.  ABDOMEN: Soft, nontender, nondistended. Bowel sounds present. No organomegaly or mass.  EXTREMITIES: No pedal edema, cyanosis, or clubbing.  NEUROLOGIC: Cranial nerves II through XII are intact. Muscle strength 5/5 in all extremities. Sensation intact. Gait not checked.  PSYCHIATRIC: The patient is alert and oriented x 3.  SKIN: No obvious rash, lesion, or ulcer.    LABORATORY PANEL:   CBC  Recent Labs Lab 01/14/16 0613  WBC 5.0  HGB 13.5  HCT 39.5  PLT 216   ------------------------------------------------------------------------------------------------------------------  Chemistries   Recent Labs Lab 01/14/16 0613  NA 127*  K 3.7  CL 90*  CO2 30  GLUCOSE 151*  BUN 13  CREATININE 0.75  CALCIUM 8.5*   ------------------------------------------------------------------------------------------------------------------  Cardiac Enzymes  Recent Labs Lab 01/13/16 0950  TROPONINI <0.03   ------------------------------------------------------------------------------------------------------------------  RADIOLOGY:  Dg Chest Port 1 View  01/13/2016  CLINICAL DATA:  Shortness of breath and chest pain EXAM: PORTABLE CHEST 1 VIEW COMPARISON:  11/30/2015 FINDINGS: Cardiac shadow is within normal limits. The lungs are hyperinflated consistent with COPD. Mild apical scarring is noted bilaterally. No focal infiltrate or sizable effusion is seen. No bony abnormality is noted IMPRESSION: COPD without acute abnormality. Electronically Signed   By: Alcide Clever M.D.   On: 01/13/2016 10:11    EKG:   Orders placed or performed during the hospital encounter of 01/13/16  . ED EKG  . ED EKG  . EKG 12-Lead  . EKG 12-Lead  . EKG 12-Lead  . EKG 12-Lead   .  EKG 12-Lead  . EKG 12-Lead    ASSESSMENT AND PLAN:   60 year old female with known history of COPD not on home oxygen currently, CAD, GERD presents to the hospital secondary to worsening breathing.  #1 acute hypoxic respiratory failure-with dyspnea, tachypnea and also hypoxia. -Diagnosed with influenza and also COPD exacerbation. -Continue oxygen support. In the past patient was on oxygen that has been weaned off. Continue steroids, on duo nebs. -Continue Tamiflu for her flu symptoms. -Cough medicines added. -Chest x-ray with no infiltrates seen  #2 hypertension-continue Norvasc  #3 depression and anxiety-continue Lexapro and Xanax  #4 GERD-on Protonix twice a day  #5 DVT prophylaxis-on Lovenox   All the records are reviewed and case discussed with Care Management/Social Workerr. Management plans discussed with the patient, family and they are in agreement.  CODE STATUS: Full code  TOTAL TIME TAKING CARE OF THIS PATIENT: 37 minutes.   POSSIBLE D/C IN 1-2 DAYS, DEPENDING ON CLINICAL CONDITION.   Enid BaasKALISETTI,Lilienne Weins M.D on 01/14/2016 at 1:37 PM  Between 7am to 6pm - Pager - [phone]  After 6pm go to [email or web address] - password [email or web address] [facility]  [facility]  Office  [phone]  CC: Primary care physician; Leotis ShamesSingh,Jasmine, MD

## 2016-01-15 ENCOUNTER — Inpatient Hospital Stay: Payer: Medicare Other

## 2016-01-15 ENCOUNTER — Encounter: Payer: Self-pay | Admitting: Radiology

## 2016-01-15 LAB — BASIC METABOLIC PANEL
Anion gap: 5 (ref 5–15)
BUN: 13 mg/dL (ref 6–20)
CALCIUM: 8.2 mg/dL — AB (ref 8.9–10.3)
CO2: 30 mmol/L (ref 22–32)
CREATININE: 0.67 mg/dL (ref 0.44–1.00)
Chloride: 101 mmol/L (ref 101–111)
GFR calc non Af Amer: >60 mL/min (ref >60)
GLUCOSE: 99 mg/dL (ref 65–99)
Potassium: 3.7 mmol/L (ref 3.5–5.1)
Sodium: 136 mmol/L (ref 135–145)

## 2016-01-15 MED ORDER — IOHEXOL 300 MG/ML  SOLN
75.0000 mL | Freq: Once | INTRAMUSCULAR | Status: AC | PRN
  Administered 2016-01-15: 75 mL via INTRAVENOUS

## 2016-01-15 MED ORDER — METHYLPREDNISOLONE SODIUM SUCC 40 MG IJ SOLR
40.0000 mg | Freq: Four times a day (QID) | INTRAMUSCULAR | Status: DC
  Administered 2016-01-15 – 2016-01-18 (×12): 40 mg via INTRAVENOUS
  Filled 2016-01-15 (×12): qty 1

## 2016-01-15 NOTE — Plan of Care (Signed)
Problem: Discharge Progression Outcomes Goal: O2 sats > or equal 90% or at baseline Outcome: Not Progressing I titrated O2 to room air. Pt O2 dropped to 88%- 89% within minutes at rest. 2L O2 per Naplate reapplied, O2 sats up to mid 90's. MD aware. VSS. Frequent non productive cough. Cough meds given. Dyspnea on exertion, resolved without an intervention.

## 2016-01-15 NOTE — Progress Notes (Signed)
[facility] - [place] at [facility]   PATIENT NAME: Heidi [name]    MR#:  [id number]  DATE OF BIRTH:  June 17, 1956  SUBJECTIVE:  CHIEF COMPLAINT:   Chief Complaint  Patient presents with  . Shortness of Breath   -Admitted with COPD exacerbation. Also diagnosed with flu. -Complains of worsening cough. Also has shortness of breath. Not on home oxygen but currently on 2-3 L oxygen.  REVIEW OF SYSTEMS:  Review of Systems  Constitutional: Negative for fever and chills.  HENT: Negative for ear discharge, ear pain and nosebleeds.   Eyes: Negative for blurred vision, double vision and photophobia.  Respiratory: Positive for cough, shortness of breath and wheezing.   Cardiovascular: Negative for chest pain, palpitations and leg swelling.  Gastrointestinal: Negative for nausea, vomiting, abdominal pain, diarrhea and constipation.  Genitourinary: Negative for dysuria and urgency.  Musculoskeletal: Negative for myalgias and neck pain.  Neurological: Positive for weakness. Negative for dizziness, tremors, sensory change, speech change, focal weakness, seizures and headaches.  Psychiatric/Behavioral: Negative for depression.    DRUG ALLERGIES:   Allergies  Allergen Reactions  . Codeine Nausea Only  . Levaquin [Levofloxacin In D5w] Hives  . Zoloft [Sertraline Hcl]     Increased anxiety    VITALS:  Blood pressure 122/63, pulse 88, temperature 98.5 F (36.9 C), temperature source Oral, resp. rate 18, height  (1.676 m), weight 56.7 kg (125 lb), SpO2 92 %.  PHYSICAL EXAMINATION:  Physical Exam  GENERAL:  60 y.o.-year-old poorly nourished patient lying in the bed with no acute distress. Occasional coughing spells EYES: Pupils equal, round, reactive to light and accommodation. No scleral icterus. Extraocular muscles intact.  HEENT: Head atraumatic, normocephalic. Oropharynx and nasopharynx clear.  NECK:  Supple, no jugular venous distention. No thyroid  enlargement, no tenderness.  LUNGS: Diffuse scattered wheezing bilaterally, bibasilar rhonchi heard. No results or crepitation. No use of accessory muscles for breathing.  CARDIOVASCULAR: S1, S2 normal. No murmurs, rubs, or gallops.  ABDOMEN: Soft, nontender, nondistended. Bowel sounds present. No organomegaly or mass.  EXTREMITIES: No pedal edema, cyanosis, or clubbing.  NEUROLOGIC: Cranial nerves II through XII are intact. Muscle strength 5/5 in all extremities. Sensation intact. Gait not checked.  PSYCHIATRIC: The patient is alert and oriented x 3.  SKIN: No obvious rash, lesion, or ulcer.    LABORATORY PANEL:   CBC  Recent Labs Lab 01/14/16 0613  WBC 5.0  HGB 13.5  HCT 39.5  PLT 216   ------------------------------------------------------------------------------------------------------------------  Chemistries   Recent Labs Lab 01/15/16 0543  NA 136  K 3.7  CL 101  CO2 30  GLUCOSE 99  BUN 13  CREATININE 0.67  CALCIUM 8.2*   ------------------------------------------------------------------------------------------------------------------  Cardiac Enzymes  Recent Labs Lab 01/13/16 0950  TROPONINI <0.03   ------------------------------------------------------------------------------------------------------------------  RADIOLOGY:  No results found.  EKG:   Orders placed or performed during the hospital encounter of 01/13/16  . ED EKG  . ED EKG  . EKG 12-Lead  . EKG 12-Lead  . EKG 12-Lead  . EKG 12-Lead  . EKG 12-Lead  . EKG 12-Lead    ASSESSMENT AND PLAN:   60 year old female with known history of COPD not on home oxygen currently, CAD, GERD presents to the hospital secondary to worsening breathing.  #1 acute hypoxic respiratory failure-with dyspnea, tachypnea and also hypoxia. -Diagnosed with influenza and also COPD exacerbation. - Added flutter valve with her congested cough. Since no improvement in the last 48 hours, we'll get a CT  chest. -Qualify for home oxygen. Continue steroids, on duo nebs. -Continue Tamiflu for her flu symptoms. -Cough medicines added.  #2 hypertension-continue Norvasc  #3 depression and anxiety-continue Lexapro and Xanax  #4 GERD-on Protonix twice a day  #5 DVT prophylaxis-on Lovenox   All the records are reviewed and case discussed with Care Management/Social Workerr. Management plans discussed with the patient, family and they are in agreement.  CODE STATUS: Full code  TOTAL TIME TAKING CARE OF THIS PATIENT: 36 minutes.   POSSIBLE D/C IN 1-2 DAYS, DEPENDING ON CLINICAL CONDITION.   Enid BaasKALISETTI,Holleigh Crihfield M.D on 01/15/2016 at 11:43 AM  Between 7am to 6pm - Pager - [phone]  After 6pm go to [email or web address] - password [email or web address] [email or web address]  [facility]  Office  [phone]  CC: Primary care physician; Leotis ShamesSingh,Jasmine, MD

## 2016-01-16 MED ORDER — DILTIAZEM HCL ER 60 MG PO CP12
60.0000 mg | ORAL_CAPSULE | Freq: Two times a day (BID) | ORAL | Status: DC
  Administered 2016-01-16 – 2016-01-19 (×7): 60 mg via ORAL
  Filled 2016-01-16 (×10): qty 1

## 2016-01-16 MED ORDER — TRAMADOL HCL 50 MG PO TABS
50.0000 mg | ORAL_TABLET | Freq: Two times a day (BID) | ORAL | Status: DC
  Administered 2016-01-16 – 2016-01-19 (×7): 50 mg via ORAL
  Filled 2016-01-16 (×7): qty 1

## 2016-01-16 MED ORDER — BENZONATATE 100 MG PO CAPS
200.0000 mg | ORAL_CAPSULE | Freq: Three times a day (TID) | ORAL | Status: DC
  Administered 2016-01-16 – 2016-01-19 (×9): 200 mg via ORAL
  Filled 2016-01-16 (×8): qty 2

## 2016-01-16 MED ORDER — DILTIAZEM HCL 60 MG PO TABS: 30.0000 mg | ORAL_TABLET | Freq: Two times a day (BID) | ORAL | Status: DC

## 2016-01-16 NOTE — Progress Notes (Signed)
[facility] - [place] at [facility]   PATIENT NAME: Heidi [name]    MR#:  [id number]  DATE OF BIRTH:  1956/07/02  SUBJECTIVE:  CHIEF COMPLAINT:   Chief Complaint  Patient presents with  . Shortness of Breath   -Admitted with COPD exacerbation. Also diagnosed with flu. -Still has wheezing and dyspnea and cough. Not on home oxygen but currently on 2-3 L oxygen. -Qualified for home O2  REVIEW OF SYSTEMS:  Review of Systems  Constitutional: Negative for fever and chills.  HENT: Negative for ear discharge, ear pain and nosebleeds.   Eyes: Negative for blurred vision, double vision and photophobia.  Respiratory: Positive for cough, shortness of breath and wheezing.   Cardiovascular: Negative for chest pain, palpitations and leg swelling.  Gastrointestinal: Negative for nausea, vomiting, abdominal pain, diarrhea and constipation.  Genitourinary: Negative for dysuria and urgency.  Musculoskeletal: Negative for myalgias and neck pain.  Neurological: Positive for weakness. Negative for dizziness, tremors, sensory change, speech change, focal weakness, seizures and headaches.  Psychiatric/Behavioral: Negative for depression.    DRUG ALLERGIES:   Allergies  Allergen Reactions  . Codeine Nausea Only  . Levaquin [Levofloxacin In D5w] Hives  . Zoloft [Sertraline Hcl]     Increased anxiety    VITALS:  Blood pressure 164/86, pulse 116, temperature 98 F (36.7 C), temperature source Oral, resp. rate 20, height 5\' 6"  (1.676 m), weight 56.7 kg (125 lb), SpO2 93 %.  PHYSICAL EXAMINATION:  Physical Exam  GENERAL:  60 y.o.-year-old poorly nourished patient lying in the bed with no acute distress. Occasional coughing spells EYES: Pupils equal, round, reactive to light and accommodation. No scleral icterus. Extraocular muscles intact.  HEENT: Head atraumatic, normocephalic. Oropharynx and nasopharynx clear.  NECK:  Supple, no jugular venous distention. No thyroid  enlargement, no tenderness.  LUNGS: Diffuse scattered wheezing bilaterally, bibasilar rhonchi heard. No results or crepitation. No use of accessory muscles for breathing.  CARDIOVASCULAR: S1, S2 normal. No murmurs, rubs, or gallops.  ABDOMEN: Soft, nontender, nondistended. Bowel sounds present. No organomegaly or mass.  EXTREMITIES: No pedal edema, cyanosis, or clubbing.  NEUROLOGIC: Cranial nerves II through XII are intact. Muscle strength 5/5 in all extremities. Sensation intact. Gait not checked.  PSYCHIATRIC: The patient is alert and oriented x 3.  SKIN: No obvious rash, lesion, or ulcer.    LABORATORY PANEL:   CBC  Recent Labs Lab 01/14/16 0613  WBC 5.0  HGB 13.5  HCT 39.5  PLT 216   ------------------------------------------------------------------------------------------------------------------  Chemistries   Recent Labs Lab 01/15/16 0543  NA 136  K 3.7  CL 101  CO2 30  GLUCOSE 99  BUN 13  CREATININE 0.67  CALCIUM 8.2*   ------------------------------------------------------------------------------------------------------------------  Cardiac Enzymes  Recent Labs Lab 01/13/16 0950  TROPONINI <0.03   ------------------------------------------------------------------------------------------------------------------  RADIOLOGY:  Ct Chest W Contrast  01/15/2016  CLINICAL DATA:  Known history of COPD. Recent diagnosis of influenza. Shortness of breath and cough, wheezing. COPD exacerbation. 3 L of oxygen requirement. EXAM: CT CHEST WITH CONTRAST TECHNIQUE: Multidetector CT imaging of the chest was performed during intravenous contrast administration. CONTRAST:  75mL OMNIPAQUE IOHEXOL 300 MG/ML  SOLN COMPARISON:  01/13/2016 FINDINGS: Heart: Heart size is normal. No imaged pericardial effusion or significant coronary artery calcifications. Vascular structures: There is atherosclerotic calcification of the thoracic aorta. No aneurysm or evidence for dissection.  Aberrant right subclavian artery. Pulmonary arteries are grossly well opacified. Mediastinum/thyroid: The visualized portion of the thyroid gland has a normal appearance. No  mediastinal, hilar, or axillary adenopathy. Lungs/Airways: Marked emphysematous changes are present. There is biapical pleural parenchymal thickening/ scar. There are no focal consolidations. No pleural effusions. Minimal left basilar subsegmental atelectasis. No suspicious pulmonary nodules. Airways are patent. Upper abdomen: Unremarkable. Chest wall/osseous structures: Moderate thoracic kyphosis. Visualized osseous structures have a normal appearance. IMPRESSION: 1. Marked emphysematous changes. 2.  No evidence for acute pulmonary abnormality. 3. Thoracic aorta atherosclerosis. 4. Thoracic kyphosis. Electronically Signed   By: Norva Pavlov M.D.   On: 01/15/2016 16:38    EKG:   Orders placed or performed during the hospital encounter of 01/13/16  . ED EKG  . ED EKG  . EKG 12-Lead  . EKG 12-Lead  . EKG 12-Lead  . EKG 12-Lead  . EKG 12-Lead  . EKG 12-Lead    ASSESSMENT AND PLAN:   60 year old female with known history of COPD not on home oxygen currently, CAD, GERD presents to the hospital secondary to worsening breathing.  #1 acute hypoxic respiratory failure-with dyspnea, tachypnea and also hypoxia. -Diagnosed with influenza and also COPD exacerbation. - Added flutter valve with her congested cough.  - CT chest with significant emphysema-Qualified for home oxygen. - will need 2L continuous o2 Continue steroids, on duo nebs. -Continue Tamiflu for her flu symptoms. -Cough medicines added.  #2 hypertension-continue Norvasc Added low dose cardizem while in the hospital only.  #3 depression and anxiety-continue Lexapro and Xanax  #4 GERD-on Protonix twice a day  #5 DVT prophylaxis-on Lovenox   All the records are reviewed and case discussed with Care Management/Social Workerr. Management plans discussed  with the patient, family and they are in agreement.  CODE STATUS: Full code  TOTAL TIME TAKING CARE OF THIS PATIENT: 36 minutes.   POSSIBLE D/C IN 1-2 DAYS, DEPENDING ON CLINICAL CONDITION.   Enid Baas M.D on 01/16/2016 at 10:21 AM  Between 7am to 6pm - Pager - [phone]  After 6pm go to [email or web address] - password [email or web address] [facility]  [place] [facility]  Office  [phone]  CC: Primary care physician; Leotis Shames, MD

## 2016-01-17 LAB — BASIC METABOLIC PANEL
ANION GAP: 8 (ref 5–15)
BUN: 12 mg/dL (ref 6–20)
CALCIUM: 8.9 mg/dL (ref 8.9–10.3)
CO2: 32 mmol/L (ref 22–32)
Chloride: 94 mmol/L — ABNORMAL LOW (ref 101–111)
Creatinine, Ser: 0.66 mg/dL (ref 0.44–1.00)
GFR calc Af Amer: >60 mL/min (ref >60)
GLUCOSE: 173 mg/dL — AB (ref 65–99)
Potassium: 3.7 mmol/L (ref 3.5–5.1)
SODIUM: 134 mmol/L — AB (ref 135–145)

## 2016-01-17 MED ORDER — OSELTAMIVIR PHOSPHATE 75 MG PO CAPS
75.0000 mg | ORAL_CAPSULE | Freq: Two times a day (BID) | ORAL | Status: AC
  Administered 2016-01-17: 75 mg via ORAL
  Filled 2016-01-17: qty 1

## 2016-01-17 MED ORDER — CETYLPYRIDINIUM CHLORIDE 0.05 % MT LIQD
7.0000 mL | Freq: Two times a day (BID) | OROMUCOSAL | Status: DC
  Administered 2016-01-17 – 2016-01-19 (×4): 7 mL via OROMUCOSAL

## 2016-01-17 MED ORDER — GUAIFENESIN ER 600 MG PO TB12
600.0000 mg | ORAL_TABLET | Freq: Two times a day (BID) | ORAL | Status: DC
  Administered 2016-01-17 – 2016-01-19 (×5): 600 mg via ORAL
  Filled 2016-01-17 (×5): qty 1

## 2016-01-17 MED ORDER — DOCUSATE SODIUM 100 MG PO CAPS
100.0000 mg | ORAL_CAPSULE | Freq: Two times a day (BID) | ORAL | Status: DC
  Administered 2016-01-17 – 2016-01-19 (×5): 100 mg via ORAL
  Filled 2016-01-17 (×5): qty 1

## 2016-01-17 NOTE — Progress Notes (Signed)
[facility] [place] at [facility]   PATIENT NAME: Heidi LinksCynthia [name]    MR#:  [id number]  DATE OF BIRTH:  [birth year]  SUBJECTIVE: Still has wheezing, dry cough unable to get the phlegm out.   CHIEF COMPLAINT:   Chief Complaint  Patient presents with  . Shortness of Breath   -Admitted with COPD exacerbation. Also diagnosed with flu. ot on home oxygen but currently on 2-3 L oxygen. -Qualified for home O2  REVIEW OF SYSTEMS:  Review of Systems  Constitutional: Negative for fever and chills.  HENT: Negative for ear discharge, ear pain and nosebleeds.   Eyes: Negative for blurred vision, double vision and photophobia.  Respiratory: Positive for cough, shortness of breath and wheezing.   Cardiovascular: Negative for chest pain, palpitations and leg swelling.  Gastrointestinal: Negative for nausea, vomiting, abdominal pain, diarrhea and constipation.  Genitourinary: Negative for dysuria and urgency.  Musculoskeletal: Negative for myalgias and neck pain.  Neurological: Positive for weakness. Negative for dizziness, tremors, sensory change, speech change, focal weakness, seizures and headaches.  Psychiatric/Behavioral: Negative for depression.    DRUG ALLERGIES:   Allergies  Allergen Reactions  . Codeine Nausea Only  . Levaquin [Levofloxacin In D5w] Hives  . Zoloft [Sertraline Hcl]     Increased anxiety    VITALS:  Blood pressure 139/67, pulse 88, temperature 97.3 F (36.3 C), temperature source Oral, resp. rate 18, height 5\' 6"  (1.676 m), weight 56.7 kg (125 lb), SpO2 96 %.  PHYSICAL EXAMINATION:  Physical Exam  GENERAL:  60 y.o.-year-old poorly nourished patient lying in the bed with no acute distress. Occasional coughing spells EYES: Pupils equal, round, reactive to light and accommodation. No scleral icterus. Extraocular muscles intact.  HEENT: Head atraumatic, normocephalic. Oropharynx and nasopharynx clear.  NECK:  Supple, no jugular venous  distention. No thyroid enlargement, no tenderness.  LUNGS: Diffuse scattered wheezing bilaterally, bibasilar rhonchi heard. No results or crepitation. No use of accessory muscles for breathing.  CARDIOVASCULAR: S1, S2 normal. No murmurs, rubs, or gallops.  ABDOMEN: Soft, nontender, nondistended. Bowel sounds present. No organomegaly or mass.  EXTREMITIES: No pedal edema, cyanosis, or clubbing.  NEUROLOGIC: Cranial nerves II through XII are intact. Muscle strength 5/5 in all extremities. Sensation intact. Gait not checked.  PSYCHIATRIC: The patient is alert and oriented x 3.  SKIN: No obvious rash, lesion, or ulcer.    LABORATORY PANEL:   CBC  Recent Labs Lab 01/14/16 0613  WBC 5.0  HGB 13.5  HCT 39.5  PLT 216   ------------------------------------------------------------------------------------------------------------------  Chemistries   Recent Labs Lab 01/17/16 0431  NA 134*  K 3.7  CL 94*  CO2 32  GLUCOSE 173*  BUN 12  CREATININE 0.66  CALCIUM 8.9   ------------------------------------------------------------------------------------------------------------------  Cardiac Enzymes  Recent Labs Lab 01/13/16 0950  TROPONINI <0.03   ------------------------------------------------------------------------------------------------------------------  RADIOLOGY:  Ct Chest W Contrast  01/15/2016  CLINICAL DATA:  Known history of COPD. Recent diagnosis of influenza. Shortness of breath and cough, wheezing. COPD exacerbation. 3 L of oxygen requirement. EXAM: CT CHEST WITH CONTRAST TECHNIQUE: Multidetector CT imaging of the chest was performed during intravenous contrast administration. CONTRAST:  75mL OMNIPAQUE IOHEXOL 300 MG/ML  SOLN COMPARISON:  01/13/2016 FINDINGS: Heart: Heart size is normal. No imaged pericardial effusion or significant coronary artery calcifications. Vascular structures: There is atherosclerotic calcification of the thoracic aorta. No aneurysm or  evidence for dissection. Aberrant right subclavian artery. Pulmonary arteries are grossly well opacified. Mediastinum/thyroid: The visualized portion of the thyroid gland  has a normal appearance. No mediastinal, hilar, or axillary adenopathy. Lungs/Airways: Marked emphysematous changes are present. There is biapical pleural parenchymal thickening/ scar. There are no focal consolidations. No pleural effusions. Minimal left basilar subsegmental atelectasis. No suspicious pulmonary nodules. Airways are patent. Upper abdomen: Unremarkable. Chest wall/osseous structures: Moderate thoracic kyphosis. Visualized osseous structures have a normal appearance. IMPRESSION: 1. Marked emphysematous changes. 2.  No evidence for acute pulmonary abnormality. 3. Thoracic aorta atherosclerosis. 4. Thoracic kyphosis. Electronically Signed   By: Norva Pavlov M.D.   On: 01/15/2016 16:38    EKG:   Orders placed or performed during the hospital encounter of 01/13/16  . ED EKG  . ED EKG  . EKG 12-Lead  . EKG 12-Lead  . EKG 12-Lead  . EKG 12-Lead  . EKG 12-Lead  . EKG 12-Lead    ASSESSMENT AND PLAN:   60 year old female with known history of COPD not on home oxygen currently, CAD, GERD presents to the hospital secondary to worsening breathing.  #1 acute hypoxic respiratory failure-with dyspnea, tachypnea and also hypoxia. -Diagnosed with influenza and also COPD exacerbation. - Added flutter valve with her congested cough.  - CT chest with significant emphysema-Qualified for home oxygen. Says she was on oxygen before but off oxygen now for 1 year.  Quit Smoking for 4 years. Continue Dulera,and Flonase, Solu-Medrol, Tussionex - will need 2L continuous o2 Continue steroids, on duo nebs. -Continue Tamiflu for her flu symptoms. For 5 days Mucinex..  #2 hypertension-continue Norvasc On  low dose cardizem while in the hospital only  For tachycardia related to respiratory problems.  #3 depression and  anxiety-continue Lexapro and Xanax  #4 GERD-on Protonix twice a day  #5 DVT prophylaxis-on Lovenox   All the records are reviewed and case discussed with Care Management/Social Workerr. Management plans discussed with the patient, family and they are in agreement.  CODE STATUS: Full code  TOTAL TIME TAKING CARE OF THIS PATIENT: 32 minutes.   POSSIBLE D/C IN 1-2 DAYS, DEPENDING ON CLINICAL CONDITION.   Katha Hamming M.D on 01/17/2016 at 9:07 AM  Between 7am to 6pm - Pager - [phone]  After 6pm go to [email or web address] - password [email or web address] [facility]  [facility]  Office  [phone]  CC: Primary care physician; Leotis Shames, MD

## 2016-01-17 NOTE — Progress Notes (Signed)
SATURATION QUALIFICATIONS: (This note is used to comply with regulatory documentation for home oxygen)  Patient Saturations on Room Air at Rest = 88%  Patient Saturations on Room Air while Ambulating = 84%  Patient Saturations on 2 Liters of oxygen while Ambulating =94%

## 2016-01-17 NOTE — Care Management Important Message (Signed)
Important Message  Patient Details  Name: Heidi [name] MRN: [id number] Date of Birth: February 01, 1956   Medicare Important Message Given:  Yes    Gwenette GreetBrenda S Barth Trella, RN 01/17/2016, 12:10 PM

## 2016-01-18 ENCOUNTER — Inpatient Hospital Stay: Payer: Medicare Other

## 2016-01-18 MED ORDER — PREDNISONE 20 MG PO TABS
40.0000 mg | ORAL_TABLET | Freq: Two times a day (BID) | ORAL | Status: DC
  Administered 2016-01-18 – 2016-01-19 (×2): 40 mg via ORAL
  Filled 2016-01-18 (×2): qty 2

## 2016-01-18 NOTE — Progress Notes (Signed)
[facility] [place] [facility]   PATIENT NAME: Heidi LinksCynthia [name]    MR#:  [id number]  DATE OF BIRTH:  January 09, 1956  SUBJECTIVE: has shortness of breath and wheezing.   CHIEF COMPLAINT:   Chief Complaint  Patient presents with  . Shortness of Breath   -Admitted with COPD exacerbation. Also diagnosed with flu. ot on home oxygen but currently on 2-3 L oxygen. -Qualified for home O2  REVIEW OF SYSTEMS:  Review of Systems  Constitutional: Negative for fever and chills.  HENT: Negative for ear discharge, ear pain and nosebleeds.   Eyes: Negative for blurred vision, double vision and photophobia.  Respiratory: Positive for cough, shortness of breath and wheezing.   Cardiovascular: Negative for chest pain, palpitations and leg swelling.  Gastrointestinal: Negative for nausea, vomiting, abdominal pain, diarrhea and constipation.  Genitourinary: Negative for dysuria and urgency.  Musculoskeletal: Negative for myalgias and neck pain.  Neurological: Positive for weakness. Negative for dizziness, tremors, sensory change, speech change, focal weakness, seizures and headaches.  Psychiatric/Behavioral: Negative for depression.    DRUG ALLERGIES:   Allergies  Allergen Reactions  . Codeine Nausea Only  . Levaquin [Levofloxacin In D5w] Hives  . Zoloft [Sertraline Hcl]     Increased anxiety    VITALS:  Blood pressure 123/52, pulse 100, temperature 97.8 F (36.6 C), temperature source Oral, resp. rate 18, height 5\' 6"  (1.676 m), weight 56.7 kg (125 lb), SpO2 90 %.  PHYSICAL EXAMINATION:  Physical Exam  GENERAL:  60 y.o.-year-old poorly nourished patient lying in the bed with no acute distress. Occasional coughing spells EYES: Pupils equal, round, reactive to light and accommodation. No scleral icterus. Extraocular muscles intact.  HEENT: Head atraumatic, normocephalic. Oropharynx and nasopharynx clear.  NECK:  Supple, no jugular venous distention. No thyroid  enlargement, no tenderness.  LUNGS: Diffuse scattered wheezing bilaterally,No  crepitation. No use of accessory muscles for breathing.  CARDIOVASCULAR: S1, S2 normal. No murmurs, rubs, or gallops.  ABDOMEN: Soft, nontender, nondistended. Bowel sounds present. No organomegaly or mass.  EXTREMITIES: No pedal edema, cyanosis, or clubbing.  NEUROLOGIC: Cranial nerves II through XII are intact. Muscle strength 5/5 in all extremities. Sensation intact. Gait not checked.  PSYCHIATRIC: The patient is alert and oriented x 3.  SKIN: No obvious rash, lesion, or ulcer.    LABORATORY PANEL:   CBC  Recent Labs Lab 01/14/16 0613  WBC 5.0  HGB 13.5  HCT 39.5  PLT 216   ------------------------------------------------------------------------------------------------------------------  Chemistries   Recent Labs Lab 01/17/16 0431  NA 134*  K 3.7  CL 94*  CO2 32  GLUCOSE 173*  BUN 12  CREATININE 0.66  CALCIUM 8.9   ------------------------------------------------------------------------------------------------------------------  Cardiac Enzymes  Recent Labs Lab 01/13/16 0950  TROPONINI <0.03   ------------------------------------------------------------------------------------------------------------------  RADIOLOGY:  No results found.  EKG:   Orders placed or performed during the hospital encounter of 01/13/16  . ED EKG  . ED EKG  . EKG 12-Lead  . EKG 12-Lead  . EKG 12-Lead  . EKG 12-Lead  . EKG 12-Lead  . EKG 12-Lead    ASSESSMENT AND PLAN:   60 year old female with known history of COPD not on home oxygen currently, CAD, GERD presents to the hospital secondary to worsening breathing.  #1 acute hypoxic respiratory failure-with dyspnea, tachypnea and also hypoxia. -Diagnosed with influenza and also COPD exacerbation. Recovery is very slow still has lot of wheezing. Chest x-ray today. - Added flutter valve with her congested cough.  - CT chest  with significant  emphysema-Qualified for home oxygen. Says she was on oxygen before but off oxygen now for 1 year.  Quit Smoking for 4 years. Continue Dulera,and Flonase, Solu-Medrol, Tussionex - will need 2L continuous o2 Continue steroids, on duo nebs. -Patient finished 5 days of Tamiflu. #2 hypertension-continue Norvasc On  low dose cardizem while in the hospital only  For tachycardia related to respiratory problems.  #3 depression and anxiety-continue Lexapro and Xanax  #4 GERD-on Protonix twice a day  #5 DVT prophylaxis-on Lovenox Likely discharge tomorrow with tapering course of steroids.  All the records are reviewed and case discussed with Care Management/Social Workerr. Management plans discussed with the patient, family and they are in agreement.  CODE STATUS: Full code  TOTAL TIME TAKING CARE OF THIS PATIENT: .   POSSIBLE D/C IN 1-2 DAYS, DEPENDING ON CLINICAL CONDITION.   Katha Hamming M.D on 01/18/2016 at 10:01 AM  Between 7am to 6pm - Pager - [phone]  After 6pm go to [email or web address] - password [email or web address] [facility]  [place] [facility]  Office  [phone]  CC: Primary care physician; Leotis Shames, MD

## 2016-01-19 MED ORDER — IPRATROPIUM-ALBUTEROL 0.5-2.5 (3) MG/3ML IN SOLN: 3.0000 mL | RESPIRATORY_TRACT | Status: DC

## 2016-01-19 MED ORDER — DILTIAZEM HCL ER 60 MG PO CP12: 60.0000 mg | ORAL_CAPSULE | Freq: Two times a day (BID) | ORAL | Status: DC

## 2016-01-19 MED ORDER — PREDNISONE 10 MG (21) PO TBPK: 10.0000 mg | ORAL_TABLET | Freq: Every day | ORAL | Status: DC

## 2016-01-19 NOTE — Plan of Care (Signed)
Pt diag w/flu and COPD exacerbation d/ced home.  Home O2 lined up. Still having exp wheezes on R side. Pt still experiencing tightness in chest and nonproductive cough.   Nursing student removed IV and she and instructor reviewed d/c instructions and f/u appts.  Pt left via wheelchair.  Picked up by boyfriend.

## 2016-01-19 NOTE — Progress Notes (Signed)
SATURATION QUALIFICATIONS: (This note is used to comply with regulatory documentation for home oxygen)  Patient Saturations on Room Air at Rest = 94%  Patient Saturations on Room Air while Ambulating = 84%  Patient Saturations on 2 Liters of oxygen while Ambulating =91%  Henriette CombsSarah Kinsley Holderman RN

## 2016-01-19 NOTE — Care Management (Signed)
Admitted to [facility] with the diagnosis of COPD. Lives with friend, Danley DankerJimmy Ball [phone]). Last seen Dr, Leotis ShamesJasmine Singh on Wednesday prior to admission. No home health. No skilled facility. Uses no aids for ambulation. No home oxygen. Takes care of all basic and instrumental activities of daily living herself, drives. No falls. Decreased appetite. Friend will transport. Qualifies for home oxygen [facility] like [facility]. [facility] representative updated. Discharge to home today per Dr Luberta MutterKonidena. Gwenette GreetBrenda S Davinci Glotfelty RN MSN CCM Care Management [phone]

## 2016-01-19 NOTE — Discharge Summary (Signed)
Heidi [name], is a 60 y.o. female  DOB September 11, 1956  MRN [id number].  Admission date:  01/13/2016  Admitting Physician  Adrian Saran, MD  Discharge Date:  01/19/2016   Primary MD  Leotis Shames, MD  Recommendations for primary care physician for things to follow:   Follow-up with primary doctor in 1 week.   Admission Diagnosis  Influenza A [J10.1] Hypoxia [R09.02] Chronic obstructive pulmonary disease with acute exacerbation (HCC) [J44.1]   Discharge Diagnosis  Influenza A [J10.1] Hypoxia [R09.02] Chronic obstructive pulmonary disease with acute exacerbation (HCC) [J44.1]   Active Problems:   COPD (chronic obstructive pulmonary disease) (HCC)      Past Medical History  Diagnosis Date  . COPD (chronic obstructive pulmonary disease) (HCC)   . NSTEMI (non-ST elevated myocardial infarction) (HCC)     12/2011 with normal coronaries possibly secondary to Takotsubo (EF 30-35% by echo), occurred following two episodes of acute respiratory distress  . Hypotension     limiting med titration  . 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 GERD (gastroesophageal reflux disease)     Past Surgical History  Procedure Laterality Date  . Abdominal hysterectomy    . Vagina rectal repair      after childbirth  . Cardiac catheterization    . Left heart catheterization with coronary angiogram N/A 01/23/2012    Procedure: LEFT HEART CATHETERIZATION WITH CORONARY ANGIOGRAM;  Surgeon: Dolores Patty, MD;  Location: [facility] CATH LAB;  Service: Cardiovascular;  Laterality: N/A;       History of present illness and  Hospital Course:     Kindly see H&P for history of present illness and admission details, please review complete Labs, Consult reports and Test reports for all details in brief  HPI  from the history and physical done on the day of  admission 60 year old female patient admitted because of COPD exacerbation, also diagnosed with influenza.had shortness of breath. Patient had history after influenza and was started on Tamiflu by primary doctor. Exam admission showed no pneumonia.   Hospital Course   Acute hypoxic respiratory failure due to COPD exacerbation: Patient started on IV Solu-Medrol, nebulizers, Dulera. Chest x-ray, CT of the chest did not show pneumonia but showed a lot of emphysema. Patient wheezing improved. [name] for the cough along with Mucomyst and Mucomyst to. O2 saturations on now room air while ambulating 84% and on room air at rest 94%. Patient needed 2 L of oxygen while ambulating to keep sats more than 90%. So we are discharging her home with 2 L of oxygen to use. Patient was on oxygen before. Discharge her home with the very slow taper of prednisone, advised her to continue NEBULIZER EVERY 4 HOURS FOR ABOUT A WEEK. SHE SAYS SHE HAS NEBULIZER MACHINE. #2. Influenza history: He finished 5 days of Tamiflu here. #3. essential hypertension, tachycardia: Patient received Cardizem while in the hospital because of tachycardia and respiratory distress. Patient heart rate is better controlled. So she can continue Cardizem. Discontinue amlodipine.  4. depression: #5 GERD: PPIs are continued.   Discharge Condition: Stable   Follow UP   follow-up with primary doctor in 1 week.   Discharge Instructions  and  Discharge Medications        Medication List    STOP taking these medications        amLODipine 2.5 MG tablet  Commonly known as:  NORVASC     oseltamivir 75 MG capsule  Commonly known as:  TAMIFLU  TAKE these medications        albuterol (2.5 MG/3ML) 0.083% nebulizer solution  Commonly known as:  PROVENTIL  Take 3 mLs (2.5 mg total) by nebulization every 6 (six) hours as needed for wheezing or shortness of breath.     PROAIR HFA 108 (90 Base) MCG/ACT inhaler   Generic drug:  albuterol  INHALE TWO PUFFS BY MOUTH 4 TIMES DAILY AS NEEDED     ALPRAZolam 0.25 MG tablet  Commonly known as:  XANAX  Take 0.25 mg by mouth at bedtime as needed for anxiety.     benzonatate 100 MG capsule  Commonly known as:  TESSALON  Take 1 capsule by mouth 3 (three) times daily as needed for cough.     budesonide-formoterol 160-4.5 MCG/ACT inhaler  Commonly known as:  SYMBICORT  Inhale 2 puffs into the lungs 2 (two) times daily.     diltiazem 60 MG 12 hr capsule  Commonly known as:  CARDIZEM SR  Take 1 capsule (60 mg total) by mouth every 12 (twelve) hours.     escitalopram 5 MG tablet  Commonly known as:  LEXAPRO  Take 5 mg by mouth daily.     fluticasone 50 MCG/ACT nasal spray  Commonly known as:  FLONASE  Place 2 sprays into both nostrils daily.     ipratropium 0.02 % nebulizer solution  Commonly known as:  ATROVENT  Take 2.5 mLs (0.5 mg total) by nebulization every 6 (six) hours as needed for wheezing or shortness of breath. Dx Code J43.2     Ipratropium-Albuterol 20-100 MCG/ACT Aers respimat  Commonly known as:  COMBIVENT RESPIMAT  Inhale 1 puff into the lungs every 6 (six) hours as needed for wheezing.     ipratropium-albuterol 0.5-2.5 (3) MG/3ML Soln  Commonly known as:  DUONEB  Take 3 mLs by nebulization every 4 (four) hours.     pantoprazole 40 MG tablet  Commonly known as:  PROTONIX  Take 40 mg by mouth 2 (two) times daily. Take 30-60 min before first meal of the day     predniSONE 10 MG (21) Tbpk tablet  Commonly known as:  STERAPRED UNI-PAK 21 TAB  Take 1 tablet (10 mg total) by mouth daily. Take 40 mg po daily for 3 days 30 mg po daily for 3 days 20 mg po daily for 3 days 10 mg po daily for 3 days          Diet and Activity recommendation: See Discharge Instructions above   Consults obtained - none   Major procedures and Radiology Reports - PLEASE review detailed and final reports for all details, in brief -     Dg Chest  1 View  01/18/2016  CLINICAL DATA:  Follow-up for pneumonia. Shortness of breath. Admitted with COPD exacerbation. EXAM: CHEST 1 VIEW COMPARISON:  Chest x-rays dated 01/13/2016 and 11/30/2015. Comparison also made to chest CT of 01/15/2016. FINDINGS: Heart size is normal. Overall cardiomediastinal silhouette is stable in size and configuration. Lungs are clear. Lungs are again noted to be hyperexpanded compatible with given history of COPD. No pneumothorax or pleural effusions seen. Osseous structures about the chest are unremarkable. IMPRESSION: 1. Hyperexpanded lungs compatible with given history of COPD/emphysema. 2. Lungs are clear and there is no evidence of acute cardiopulmonary abnormality. No evidence of pneumonia. Electronically Signed   By: Bary RichardStan  Maynard M.D.   On: 01/18/2016 11:23   Ct Chest W Contrast  01/15/2016  CLINICAL DATA:  Known history of COPD. Recent diagnosis  of influenza. Shortness of breath and cough, wheezing. COPD exacerbation. 3 L of oxygen requirement. EXAM: CT CHEST WITH CONTRAST TECHNIQUE: Multidetector CT imaging of the chest was performed during intravenous contrast administration. CONTRAST:  75mL OMNIPAQUE IOHEXOL 300 MG/ML  SOLN COMPARISON:  01/13/2016 FINDINGS: Heart: Heart size is normal. No imaged pericardial effusion or significant coronary artery calcifications. Vascular structures: There is atherosclerotic calcification of the thoracic aorta. No aneurysm or evidence for dissection. Aberrant right subclavian artery. Pulmonary arteries are grossly well opacified. Mediastinum/thyroid: The visualized portion of the thyroid gland has a normal appearance. No mediastinal, hilar, or axillary adenopathy. Lungs/Airways: Marked emphysematous changes are present. There is biapical pleural parenchymal thickening/ scar. There are no focal consolidations. No pleural effusions. Minimal left basilar subsegmental atelectasis. No suspicious pulmonary nodules. Airways are patent. Upper  abdomen: Unremarkable. Chest wall/osseous structures: Moderate thoracic kyphosis. Visualized osseous structures have a normal appearance. IMPRESSION: 1. Marked emphysematous changes. 2.  No evidence for acute pulmonary abnormality. 3. Thoracic aorta atherosclerosis. 4. Thoracic kyphosis. Electronically Signed   By: Norva Pavlov M.D.   On: 01/15/2016 16:38   Dg Chest Port 1 View  01/13/2016  CLINICAL DATA:  Shortness of breath and chest pain EXAM: PORTABLE CHEST 1 VIEW COMPARISON:  11/30/2015 FINDINGS: Cardiac shadow is within normal limits. The lungs are hyperinflated consistent with COPD. Mild apical scarring is noted bilaterally. No focal infiltrate or sizable effusion is seen. No bony abnormality is noted IMPRESSION: COPD without acute abnormality. Electronically Signed   By: Alcide Clever M.D.   On: 01/13/2016 10:11    Micro Results    No results found for this or any previous visit (from the past 240 hour(s)).     Today   Subjective:   Heidi [name] today has no headache,no chest abdominal pain,no new weakness tingling or numbness, feels much better wants to go home today.   Objective:   Blood pressure 124/68, pulse 88, temperature 97.8 F (36.6 C), temperature source Oral, resp. rate 18, height  (1.676 m), weight 56.7 kg (125 lb), SpO2 97 %.  No intake or output data in the 24 hours ending 01/19/16 0843  Exam Awake Alert, Oriented x 3, No new F.N deficits, Normal affect [place].AT,PERRAL Supple Neck,No JVD, No cervical lymphadenopathy appriciated.  Symmetrical Chest wall movement, Good air movement bilaterally, CTAB RRR,No Gallops,Rubs or new Murmurs, No Parasternal Heave +ve B.Sounds, Abd Soft, Non tender, No organomegaly appriciated, No rebound -guarding or rigidity. No Cyanosis, Clubbing or edema, No new Rash or bruise  Data Review   CBC w Diff:  Lab Results  Component Value Date   WBC 5.0 01/14/2016   WBC 6.4 11/29/2014   HGB 13.5 01/14/2016   HGB 12.4  11/29/2014   HCT 39.5 01/14/2016   HCT 37.6 11/29/2014   PLT 216 01/14/2016   PLT 293 11/29/2014   LYMPHOPCT 11 01/13/2016   LYMPHOPCT 13.2 11/29/2014   MONOPCT 7 01/13/2016   MONOPCT 1.8 11/29/2014   EOSPCT 0 01/13/2016   EOSPCT 0.0 11/29/2014   BASOPCT 0 01/13/2016   BASOPCT 0.1 11/29/2014    CMP:  Lab Results  Component Value Date   NA 134* 01/17/2016   NA 139 11/29/2014   K 3.7 01/17/2016   K 4.0 11/29/2014   CL 94* 01/17/2016   CL 105 11/29/2014   CO2 32 01/17/2016   CO2 27 11/29/2014   BUN 12 01/17/2016   BUN 6* 11/29/2014   CREATININE 0.66 01/17/2016   CREATININE 0.93 11/29/2014  PROT 7.9 11/28/2014   PROT 8.0 04/09/2013   ALBUMIN 3.5 11/28/2014   ALBUMIN 3.9 04/09/2013   BILITOT 0.3 11/28/2014   BILITOT 0.2* 04/09/2013   ALKPHOS 128* 11/28/2014   ALKPHOS 143* 04/09/2013   AST 42* 11/28/2014   AST 19 04/09/2013   ALT 24 11/28/2014   ALT 18 04/09/2013  .   Total Time in preparing paper work, data evaluation and todays exam - 35 minutes  Xee Hollman M.D on 01/19/2016 at 8:43 AM    Note: This dictation was prepared with Dragon dictation along with smaller phrase technology. Any transcriptional errors that result from this process are unintentional.

## 2016-01-19 NOTE — Care Management Important Message (Signed)
Important Message  Patient Details  Name: Yehuda SavannahCynthia M Alumbaugh MRN: [id number] Date of Birth: May 07, 1956   Medicare Important Message Given:  Yes    Gwenette GreetBrenda S Callie Bunyard, RN 01/19/2016, 10:01 AM

## 2016-01-21 LAB — BLOOD GAS, VENOUS
Patient temperature: 37
pCO2, Ven: 51 mmHg (ref 44.0–60.0)
pH, Ven: 7.34 (ref 7.320–7.430)

## 2016-02-15 ENCOUNTER — Encounter: Payer: Medicare Other | Attending: Pulmonary Disease | Admitting: Respiratory Therapy

## 2016-02-15 VITALS — Ht 65.5 in | Wt 122.4 lb

## 2016-02-15 DIAGNOSIS — J449 Chronic obstructive pulmonary disease, unspecified: Secondary | ICD-10-CM

## 2016-02-15 NOTE — Progress Notes (Signed)
Pulmonary Individual Treatment Plan  Patient Details  Name: Heidi [name] MRN: [id number] Date of Birth: 09-09-1956 Referring Provider:  Dr Max Fickle  Initial Encounter Date:       Pulmonary Rehab from 02/15/2016 in [facility] Cardiac and Pulmonary Rehab   Date  02/15/16      Visit Diagnosis: COPD, moderate (HCC)  Patient's Home Medications on Admission:  Current outpatient prescriptions:    albuterol (PROVENTIL) (2.5 MG/3ML) 0.083% nebulizer solution, Take 3 mLs (2.5 mg total) by nebulization every 6 (six) hours as needed for wheezing or shortness of breath., Disp: 360 mL, Rfl: 3   ALPRAZolam (XANAX) 0.25 MG tablet, Take 0.25 mg by mouth at bedtime as needed for anxiety., Disp: , Rfl:    budesonide-formoterol (SYMBICORT) 160-4.5 MCG/ACT inhaler, Inhale 2 puffs into the lungs 2 (two) times daily., Disp: 1 Inhaler, Rfl: 6   diltiazem (CARDIZEM SR) 60 MG 12 hr capsule, Take 1 capsule (60 mg total) by mouth every 12 (twelve) hours., Disp: 30 capsule, Rfl: 0   escitalopram (LEXAPRO) 5 MG tablet, Take 5 mg by mouth daily. , Disp: , Rfl:    fluticasone (FLONASE) 50 MCG/ACT nasal spray, Place 2 sprays into both nostrils daily., Disp: 16 g, Rfl: 5   ipratropium (ATROVENT) 0.02 % nebulizer solution, Take 2.5 mLs (0.5 mg total) by nebulization every 6 (six) hours as needed for wheezing or shortness of breath. Dx Code J43.2, Disp: 360 mL, Rfl: 3   Ipratropium-Albuterol (COMBIVENT RESPIMAT) 20-100 MCG/ACT AERS respimat, Inhale 1 puff into the lungs every 6 (six) hours as needed for wheezing., Disp: 1 Inhaler, Rfl: 5   ipratropium-albuterol (DUONEB) 0.5-2.5 (3) MG/3ML SOLN, Take 3 mLs by nebulization every 4 (four) hours., Disp: 180 mL, Rfl: 0   pantoprazole (PROTONIX) 40 MG tablet, Take 40 mg by mouth 2 (two) times daily. Take 30-60 min before first meal of the day, Disp: , Rfl:    predniSONE (STERAPRED UNI-PAK 21 TAB) 10 MG (21) TBPK tablet, Take 1 tablet (10 mg total) by mouth daily.  Take 40 mg po daily for 3 days 30 mg po daily for 3 days 20 mg po daily for 3 days 10 mg po daily for 3 days, Disp: 30 tablet, Rfl: 0   PROAIR HFA 108 (90 BASE) MCG/ACT inhaler, INHALE TWO PUFFS BY MOUTH 4 TIMES DAILY AS NEEDED, Disp: 3 each, Rfl: 0  Past Medical History: Past Medical History  Diagnosis Date   COPD (chronic obstructive pulmonary disease) (HCC)    NSTEMI (non-ST elevated myocardial infarction) (HCC)     12/2011 with normal coronaries possibly secondary to Takotsubo (EF 30-35% by echo), occurred following two episodes of acute respiratory distress   Hypotension     limiting med titration   Takotsubo syndrome 4/13   GERD (gastroesophageal reflux disease)     Tobacco Use: History  Smoking status   Former Smoker -- 1.00 packs/day for 30 years   Types: Cigarettes   Quit date: 01/21/2012  Smokeless tobacco   Never Used    Labs: Recent Review Flowsheet Data    Labs for ITP Cardiac and Pulmonary Rehab Latest Ref Rng 01/23/2012 01/24/2012 02/01/2012 08/08/2012 08/08/2012   Cholestrol 0 - 200 mg/dL - 409 - - -   LDLCALC 0 - 99 mg/dL - 41 - - -   HDL >81 mg/dL - 80 - - -   Trlycerides <150 mg/dL - 65 - - -   Hemoglobin A1c <5.7 % 5.6 - - - -   PHART  7.350 - 7.450 7.391 - 7.409(H) 7.206(L) 7.332(L)   PCO2ART 35.0 - 45.0 mmHg 38.4 - 39.4 46.5(H) 47.3(H)   HCO3 20.0 - 24.0 mEq/L 23.3 - 24.4(H) 18.4(L) 25.1(H)   TCO2 0 - 100 mmol/L 24 - 21.3 20 27    ACIDBASEDEF 0.0 - 2.0 mmol/L 1.0 - - 9.0(H) 1.0   O2SAT - 98.0 - 94.1 100.0 99.0       ADL UCSD:     Pulmonary Assessment Scores      02/15/16 1330       ADL UCSD   ADL Phase Entry     SOB Score total 55     Rest 0     Walk 1     Stairs 3     Bath 1     Dress 1     Shop 2        Pulmonary Function Assessment:     Pulmonary Function Assessment - 02/15/16 1420    Pulmonary Function Tests   RV% 178 %   DLCO% 48 %   Initial Spirometry Results   FVC% 45 %   FEV1% 27 %   FEV1/FVC Ratio 45   Post  Bronchodilator Spirometry Results   FVC% 63 %   FEV1% 32 %   FEV1/FVC Ratio 38   Breath   Bilateral Breath Sounds Clear;Decreased   Shortness of Breath Yes;Fear of Shortness of Breath;Limiting activity      Exercise Target Goals: Date: 02/15/16  Exercise Program Goal: Individual exercise prescription set with THRR, safety & activity barriers. Participant demonstrates ability to understand and report RPE using BORG scale, to self-measure pulse accurately, and to acknowledge the importance of the exercise prescription.  Exercise Prescription Goal: Starting with aerobic activity 30 plus minutes a day, 3 days per week for initial exercise prescription. Provide home exercise prescription and guidelines that participant acknowledges understanding prior to discharge.  Activity Barriers & Risk Stratification:     Activity Barriers & Cardiac Risk Stratification - 02/15/16 1330    Activity Barriers & Cardiac Risk Stratification   Activity Barriers Shortness of Breath;Deconditioning   Cardiac Risk Stratification Moderate      6 Minute Walk:     6 Minute Walk      02/15/16 1435       6 Minute Walk   Phase Initial     Distance 1070 feet     Walk Time 6 minutes     MPH 2     RPE 12     Resting HR 88 bpm     Resting BP 118/68 mmHg     Max Ex. HR 91 bpm     Max Ex. BP 122/72 mmHg        Initial Exercise Prescription:     Initial Exercise Prescription - 02/15/16 1400    Date of Initial Exercise RX and Referring Provider   Date 02/15/16   Treadmill   MPH 1.5   Grade 0   Minutes 15  May exercise in bouts with intermittant rest periods    Recumbant Bike   Level 2   Watts 20   Minutes 15   NuStep   Level 2   Watts 40   Minutes 15   Recumbant Elliptical   Level 2   Watts 20   Minutes 15   REL-XR   Level 2   Watts 50   Minutes 15   Biostep-RELP   Level 1   Watts 15   Minutes 15  Prescription Details   Frequency (times per week) 3   Duration Progress to 45  minutes of aerobic exercise without signs/symptoms of physical distress   Intensity   THRR REST +  30   Ratings of Perceived Exertion 11-15   Perceived Dyspnea 0-4   Progression   Progression Continue to progress workloads to maintain intensity without signs/symptoms of physical distress.   Resistance Training   Training Prescription Yes   Weight 2   Reps 10-15      Perform Capillary Blood Glucose checks as needed.  Exercise Prescription Changes:   Exercise Comments:   Discharge Exercise Prescription (Final Exercise Prescription Changes):    Nutrition:  Target Goals: Understanding of nutrition guidelines, daily intake of sodium 1500mg , cholesterol 200mg , calories 30% from fat and 7% or less from saturated fats, daily to have 5 or more servings of fruits and vegetables.  Biometrics:     Pre Biometrics - 02/15/16 1438    Pre Biometrics   Height 5' 5.5" (1.664 m)   Weight 122 lb 6.4 oz (55.52 kg)   Waist Circumference 37 inches   Hip Circumference 35 inches   Waist to Hip Ratio 1.06 %   BMI (Calculated) 20.1       Nutrition Therapy Plan and Nutrition Goals:     Nutrition Therapy & Goals - 02/15/16 1330    Nutrition Therapy   Diet Ms Carrol would like to meet with the dietitian; large portion sizes, but low salt and sweets; she does like fruits and vegetables; she has cut her soda to 2 cans a day.      Nutrition Discharge: Rate Your Plate Scores:   Psychosocial: Target Goals: Acknowledge presence or absence of depression, maximize coping skills, provide positive support system. Participant is able to verbalize types and ability to use techniques and skills needed for reducing stress and depression.  Initial Review & Psychosocial Screening:     Initial Psych Review & Screening - 02/15/16 1330    Family Dynamics   Good Support System? Yes   Comments Ms Schlechter has good support from her boyfriend and daughter. Her hospital admission for COPD exacerbation in  March was quite serious and is a concern for her to manage her COPD.   Barriers   Psychosocial barriers to participate in program The patient should benefit from training in stress management and relaxation.   Screening Interventions   Interventions Encouraged to exercise;Program counselor consult      Quality of Life Scores:     Quality of Life - 02/15/16 1435    Quality of Life Scores   Health/Function Pre 18.5 %   Socioeconomic Pre 23.29 %   Psych/Spiritual Pre 22.86 %   Family Pre 28.8 %   GLOBAL Pre 21.8 %      PHQ-9:     Recent Review Flowsheet Data    Depression screen [facility] 2/9 02/15/2016   Decreased Interest 0   Down, Depressed, Hopeless 0   PHQ - 2 Score 0   Altered sleeping 1   Tired, decreased energy 1   Change in appetite 1   Feeling bad or failure about yourself  0   Trouble concentrating 0   Moving slowly or fidgety/restless 0   Suicidal thoughts 0   PHQ-9 Score 3   Difficult doing work/chores Not difficult at all      Psychosocial Evaluation and Intervention:   Psychosocial Re-Evaluation:  Education: Education Goals: Education classes will be provided on a weekly basis, covering required  topics. Participant will state understanding/return demonstration of topics presented.  Learning Barriers/Preferences:     Learning Barriers/Preferences - 02/15/16 1330    Learning Barriers/Preferences   Learning Barriers None   Learning Preferences Group Instruction;Individual Instruction;Pictoral;Skilled Demonstration;Verbal Instruction;Video;Written Material      Education Topics: Initial Evaluation Education: - Verbal, written and demonstration of respiratory meds, RPE/PD scales, oximetry and breathing techniques. Instruction on use of nebulizers and MDIs: cleaning and proper use, rinsing mouth with steroid doses and importance of monitoring MDI activations.          Pulmonary Rehab from 02/15/2016 in [facility] Cardiac and Pulmonary Rehab   Date  02/15/16    Educator  LB   Instruction Review Code  2- meets goals/outcomes      General Nutrition Guidelines/Fats and Fiber: -Group instruction provided by verbal, written material, models and posters to present the general guidelines for heart healthy nutrition. Gives an explanation and review of dietary fats and fiber.   Controlling Sodium/Reading Food Labels: -Group verbal and written material supporting the discussion of sodium use in heart healthy nutrition. Review and explanation with models, verbal and written materials for utilization of the food label.   Exercise Physiology & Risk Factors: - Group verbal and written instruction with models to review the exercise physiology of the cardiovascular system and associated critical values. Details cardiovascular disease risk factors and the goals associated with each risk factor.   Aerobic Exercise & Resistance Training: - Gives group verbal and written discussion on the health impact of inactivity. On the components of aerobic and resistive training programs and the benefits of this training and how to safely progress through these programs.   Flexibility, Balance, General Exercise Guidelines: - Provides group verbal and written instruction on the benefits of flexibility and balance training programs. Provides general exercise guidelines with specific guidelines to those with heart or lung disease. Demonstration and skill practice provided.   Stress Management: - Provides group verbal and written instruction about the health risks of elevated stress, cause of high stress, and healthy ways to reduce stress.   Depression: - Provides group verbal and written instruction on the correlation between heart/lung disease and depressed mood, treatment options, and the stigmas associated with seeking treatment.   Exercise & Equipment Safety: - Individual verbal instruction and demonstration of equipment use and safety with use of the  equipment.   Infection Prevention: - Provides verbal and written material to individual with discussion of infection control including proper hand washing and proper equipment cleaning during exercise session.   Falls Prevention: - Provides verbal and written material to individual with discussion of falls prevention and safety.      Pulmonary Rehab from 02/15/2016 in [facility] Cardiac and Pulmonary Rehab   Date  02/15/16   Educator  LB   Instruction Review Code  2- meets goals/outcomes      Diabetes: - Individual verbal and written instruction to review signs/symptoms of diabetes, desired ranges of glucose level fasting, after meals and with exercise. Advice that pre and post exercise glucose checks will be done for 3 sessions at entry of program.   Chronic Lung Diseases: - Group verbal and written instruction to review new updates, new respiratory medications, new advancements in procedures and treatments. Provide informative websites and "800" numbers of self-education.   Lung Procedures: - Group verbal and written instruction to describe testing methods done to diagnose lung disease. Review the outcome of test results. Describe the treatment choices: Pulmonary Function Tests, ABGs and oximetry.  Energy Conservation: - Provide group verbal and written instruction for methods to conserve energy, plan and organize activities. Instruct on pacing techniques, use of adaptive equipment and posture/positioning to relieve shortness of breath.   Triggers: - Group verbal and written instruction to review types of environmental controls: home humidity, furnaces, filters, dust mite/pet prevention, HEPA vacuums. To discuss weather changes, air quality and the benefits of nasal washing.   Exacerbations: - Group verbal and written instruction to provide: warning signs, infection symptoms, calling MD promptly, preventive modes, and value of vaccinations. Review: effective airway clearance,  coughing and/or vibration techniques. Create an Sport and exercise psychologist.   Oxygen: - Individual and group verbal and written instruction on oxygen therapy. Includes supplement oxygen, available portable oxygen systems, continuous and intermittent flow rates, oxygen safety, concentrators, and Medicare reimbursement for oxygen.   Respiratory Medications: - Group verbal and written instruction to review medications for lung disease. Drug class, frequency, complications, importance of spacers, rinsing mouth after steroid MDI's, and proper cleaning methods for nebulizers.      Pulmonary Rehab from 02/15/2016 in [facility] Cardiac and Pulmonary Rehab   Date  02/15/16   Educator  LB   Instruction Review Code  2- meets goals/outcomes      AED/CPR: - Group verbal and written instruction with the use of models to demonstrate the basic use of the AED with the basic ABC's of resuscitation.   Breathing Retraining: - Provides individuals verbal and written instruction on purpose, frequency, and proper technique of diaphragmatic breathing and pursed-lipped breathing. Applies individual practice skills.      Pulmonary Rehab from 02/15/2016 in [facility] Cardiac and Pulmonary Rehab   Date  02/15/16   Educator  LB   Instruction Review Code  2- meets goals/outcomes      Anatomy and Physiology of the Lungs: - Group verbal and written instruction with the use of models to provide basic lung anatomy and physiology related to function, structure and complications of lung disease.   Heart Failure: - Group verbal and written instruction on the basics of heart failure: signs/symptoms, treatments, explanation of ejection fraction, enlarged heart and cardiomyopathy.   Sleep Apnea: - Individual verbal and written instruction to review Obstructive Sleep Apnea. Review of risk factors, methods for diagnosing and types of masks and machines for OSA.   Anxiety: - Provides group, verbal and written instruction on the correlation  between heart/lung disease and anxiety, treatment options, and management of anxiety.   Relaxation: - Provides group, verbal and written instruction about the benefits of relaxation for patients with heart/lung disease. Also provides patients with examples of relaxation techniques.   Knowledge Questionnaire Score:     Knowledge Questionnaire Score - 02/15/16 1330    Knowledge Questionnaire Score   Pre Score 10/10       Core Components/Risk Factors/Patient Goals at Admission:     Personal Goals and Risk Factors at Admission - 02/15/16 1330    Core Components/Risk Factors/Patient Goals on Admission    Weight Management Yes;Weight Loss   Intervention Weight Management: Develop a combined nutrition and exercise program designed to reach desired caloric intake, while maintaining appropriate intake of nutrient and fiber, sodium and fats, and appropriate energy expenditure required for the weight goal.;Weight Management: Provide education and appropriate resources to help participant work on and attain dietary goals.;Weight Management/Obesity: Establish reasonable short term and long term weight goals.   Admit Weight 125 lb (56.7 kg)   Goal Weight: Short Term 120 lb (54.432 kg)   Goal Weight:  Long Term 115 lb (52.164 kg)   Expected Outcomes Short Term: Continue to assess and modify interventions until short term weight is achieved;Long Term: Adherence to nutrition and physical activity/exercise program aimed toward attainment of established weight goal;Weight Maintenance: Understanding of the daily nutrition guidelines, which includes 25-35% calories from fat, 7% or less cal from saturated fats, less than 200mg  cholesterol, less than 1.5gm of sodium, & 5 or more servings of fruits and vegetables daily;Weight Loss: Understanding of general recommendations for a balanced deficit meal plan, which promotes 1-2 lb weight loss per week and includes a negative energy balance of [phone]  kcal/d;Understanding recommendations for meals to include 15-35% energy as protein, 25-35% energy from fat, 35-60% energy from carbohydrates, less than 200mg  of dietary cholesterol, 20-35 gm of total fiber daily;Understanding of distribution of calorie intake throughout the day with the consumption of 4-5 meals/snacks   Sedentary Yes   Intervention Provide advice, education, support and counseling about physical activity/exercise needs.;Develop an individualized exercise prescription for aerobic and resistive training based on initial evaluation findings, risk stratification, comorbidities and participant's personal goals.   Expected Outcomes Achievement of increased cardiorespiratory fitness and enhanced flexibility, muscular endurance and strength shown through measurements of functional capacity and personal statement of participant.   Increase Strength and Stamina Yes   Intervention Provide advice, education, support and counseling about physical activity/exercise needs.;Develop an individualized exercise prescription for aerobic and resistive training based on initial evaluation findings, risk stratification, comorbidities and participant's personal goals.   Expected Outcomes Achievement of increased cardiorespiratory fitness and enhanced flexibility, muscular endurance and strength shown through measurements of functional capacity and personal statement of participant.   Improve shortness of breath with ADL's Yes   Intervention Provide education, individualized exercise plan and daily activity instruction to help decrease symptoms of SOB with activities of daily living.   Expected Outcomes Short Term: Achieves a reduction of symptoms when performing activities of daily living.   Develop more efficient breathing techniques such as purse lipped breathing and diaphragmatic breathing; and practicing self-pacing with activity Yes   Intervention Provide education, demonstration and support about specific  breathing techniuqes utilized for more efficient breathing. Include techniques such as pursed lipped breathing, diaphragmatic breathing and self-pacing activity.   Expected Outcomes Short Term: Participant will be able to demonstrate and use breathing techniques as needed throughout daily activities.   Increase knowledge of respiratory medications and ability to use respiratory devices properly  Yes  MDI's Symbicort, Ventolin, Combivent; SVN Albuterol and Atrovent   Intervention Provide education and demonstration as needed of appropriate use of medications, inhalers, and oxygen therapy.   Expected Outcomes Short Term: Achieves understanding of medications use. Understands that oxygen is a medication prescribed by physician. Demonstrates appropriate use of inhaler and oxygen therapy.      Core Components/Risk Factors/Patient Goals Review:    Core Components/Risk Factors/Patient Goals at Discharge (Final Review):    ITP Comments:   Comments: Ms Darcella GasmanDodson plans to start on 02/21/2016 and will attend 3 days/week.

## 2016-02-15 NOTE — Patient Instructions (Signed)
Patient Instructions  Patient Details  Name: Heidi SavannahCynthia M Samons MRN: [id number] Date of Birth: 24-Aug-1956 Referring Provider:  Lupita LeashMcQuaid, Douglas B, MD  Below are the personal goals you chose as well as exercise and nutrition goals. Our goal is to help you keep on track towards obtaining and maintaining your goals. We will be discussing your progress on these goals with you throughout the program.  Initial Exercise Prescription:     Initial Exercise Prescription - 02/15/16 1400    Date of Initial Exercise RX and Referring Provider   Date 02/15/16   Treadmill   MPH 1.5   Grade 0   Minutes 15  May exercise in bouts with intermittant rest periods    Recumbant Bike   Level 2   Watts 20   Minutes 15   NuStep   Level 2   Watts 40   Minutes 15   Recumbant Elliptical   Level 2   Watts 20   Minutes 15   REL-XR   Level 2   Watts 50   Minutes 15   Biostep-RELP   Level 1   Watts 15   Minutes 15   Prescription Details   Frequency (times per week) 3   Duration Progress to 45 minutes of aerobic exercise without signs/symptoms of physical distress   Intensity   THRR REST +  30   Ratings of Perceived Exertion 11-15   Perceived Dyspnea 0-4   Progression   Progression Continue to progress workloads to maintain intensity without signs/symptoms of physical distress.   Resistance Training   Training Prescription Yes   Weight 2   Reps 10-15      Exercise Goals: Frequency: Be able to perform aerobic exercise three times per week working toward 3-5 days per week.  Intensity: Work with a perceived exertion of 11 (fairly light) - 15 (hard) as tolerated. Follow your new exercise prescription and watch for changes in prescription as you progress with the program. Changes will be reviewed with you when they are made.  Duration: You should be able to do 30 minutes of continuous aerobic exercise in addition to a 5 minute warm-up and a 5 minute cool-down routine.  Nutrition Goals: Your  personal nutrition goals will be established when you do your nutrition analysis with the dietician.  The following are nutrition guidelines to follow: Cholesterol < 200mg /day Sodium < 1500mg /day Fiber: Women over 50 yrs - 21 grams per day  Personal Goals:     Personal Goals and Risk Factors at Admission - 02/15/16 1330    Core Components/Risk Factors/Patient Goals on Admission    Weight Management Yes;Weight Loss   Intervention Weight Management: Develop a combined nutrition and exercise program designed to reach desired caloric intake, while maintaining appropriate intake of nutrient and fiber, sodium and fats, and appropriate energy expenditure required for the weight goal.;Weight Management: Provide education and appropriate resources to help participant work on and attain dietary goals.;Weight Management/Obesity: Establish reasonable short term and long term weight goals.   Admit Weight 125 lb (56.7 kg)   Goal Weight: Short Term 120 lb (54.432 kg)   Goal Weight: Long Term 115 lb (52.164 kg)   Expected Outcomes Short Term: Continue to assess and modify interventions until short term weight is achieved;Long Term: Adherence to nutrition and physical activity/exercise program aimed toward attainment of established weight goal;Weight Maintenance: Understanding of the daily nutrition guidelines, which includes 25-35% calories from fat, 7% or less cal from saturated fats, less than 200mg  cholesterol,  less than 1.5gm of sodium, & 5 or more servings of fruits and vegetables daily;Weight Loss: Understanding of general recommendations for a balanced deficit meal plan, which promotes 1-2 lb weight loss per week and includes a negative energy balance of [phone] kcal/d;Understanding recommendations for meals to include 15-35% energy as protein, 25-35% energy from fat, 35-60% energy from carbohydrates, less than  of dietary cholesterol, 20-35 gm of total fiber daily;Understanding of distribution of  calorie intake throughout the day with the consumption of 4-5 meals/snacks   Sedentary Yes   Intervention Provide advice, education, support and counseling about physical activity/exercise needs.;Develop an individualized exercise prescription for aerobic and resistive training based on initial evaluation findings, risk stratification, comorbidities and participant's personal goals.   Expected Outcomes Achievement of increased cardiorespiratory fitness and enhanced flexibility, muscular endurance and strength shown through measurements of functional capacity and personal statement of participant.   Increase Strength and Stamina Yes   Intervention Provide advice, education, support and counseling about physical activity/exercise needs.;Develop an individualized exercise prescription for aerobic and resistive training based on initial evaluation findings, risk stratification, comorbidities and participant's personal goals.   Expected Outcomes Achievement of increased cardiorespiratory fitness and enhanced flexibility, muscular endurance and strength shown through measurements of functional capacity and personal statement of participant.   Improve shortness of breath with ADL's Yes   Intervention Provide education, individualized exercise plan and daily activity instruction to help decrease symptoms of SOB with activities of daily living.   Expected Outcomes Short Term: Achieves a reduction of symptoms when performing activities of daily living.   Develop more efficient breathing techniques such as purse lipped breathing and diaphragmatic breathing; and practicing self-pacing with activity Yes   Intervention Provide education, demonstration and support about specific breathing techniuqes utilized for more efficient breathing. Include techniques such as pursed lipped breathing, diaphragmatic breathing and self-pacing activity.   Expected Outcomes Short Term: Participant will be able to demonstrate and use  breathing techniques as needed throughout daily activities.   Increase knowledge of respiratory medications and ability to use respiratory devices properly  Yes  MDI's Symbicort, Ventolin, Combivent; SVN Albuterol and Atrovent   Intervention Provide education and demonstration as needed of appropriate use of medications, inhalers, and oxygen therapy.   Expected Outcomes Short Term: Achieves understanding of medications use. Understands that oxygen is a medication prescribed by physician. Demonstrates appropriate use of inhaler and oxygen therapy.      Tobacco Use Initial Evaluation: History  Smoking status   Former Smoker -- 1.00 packs/day for 30 years   Types: Cigarettes   Quit date: 01/21/2012  Smokeless tobacco   Never Used    Copy of goals given to participant.

## 2016-02-17 ENCOUNTER — Ambulatory Visit (INDEPENDENT_AMBULATORY_CARE_PROVIDER_SITE_OTHER): Payer: Medicare Other | Admitting: Pulmonary Disease

## 2016-02-17 ENCOUNTER — Encounter: Payer: Self-pay | Admitting: Pulmonary Disease

## 2016-02-17 VITALS — BP 124/66 | HR 98 | Ht 66.0 in | Wt 122.0 lb

## 2016-02-17 DIAGNOSIS — J9611 Chronic respiratory failure with hypoxia: Secondary | ICD-10-CM | POA: Diagnosis not present

## 2016-02-17 DIAGNOSIS — Z87891 Personal history of nicotine dependence: Secondary | ICD-10-CM

## 2016-02-17 DIAGNOSIS — G252 Other specified forms of tremor: Secondary | ICD-10-CM | POA: Insufficient documentation

## 2016-02-17 MED ORDER — BUDESONIDE-FORMOTEROL FUMARATE 160-4.5 MCG/ACT IN AERO: 2.0000 | INHALATION_SPRAY | Freq: Two times a day (BID) | RESPIRATORY_TRACT | Status: DC

## 2016-02-17 MED ORDER — IPRATROPIUM-ALBUTEROL 20-100 MCG/ACT IN AERS: 1.0000 | INHALATION_SPRAY | Freq: Four times a day (QID) | RESPIRATORY_TRACT | Status: DC | PRN

## 2016-02-17 NOTE — Assessment & Plan Note (Signed)
Unfortunately Heidi BeechamCynthia had another exacerbation of COPD associated with an acute influenza infection in March. She seems to have recovered quite well but her energy level is still low. From a breathing standpoint she does not have evidence of an ongoing exacerbation at this time.  Plan: Continue Symbicort Continue when necessary Combivent Continue when necessary nebulized albuterol and Atrovent when in home

## 2016-02-17 NOTE — Progress Notes (Signed)
Subjective:    Patient ID: Heidi [name], female    DOB: 04/23/1956, 60 y.o.   MRN: [id number]  Synopsis: Heidi [name] first saw Dr. Sherene SiresWert with the [facility] in 2013. She has COPD and 8 2013 FEV1 was 0.72 L (27% predicted). She smoked heavily up until October of 2013.   HPI  Chief Complaint  Patient presents with  . Follow-up    Pt states she is doing well- recently hospitalized from flu A.      Heidi [name] was hospitalized for Flu A in 12/2015 and was admitted to [facility] days.  She was given breathing treatments for aorund the clock during her stay with solumedrol.  She feels much better now.  She was discharged home on oxygen again because her O2 level dropped on a walking test.  She is now using it primarily at night.  No cough right now, maybe a rare dry cough.  They did not change any of her inhalers.  Her energy level has improved and her dyspnea has slowly improved.  She plans to go back to rehab.     Past Medical History  Diagnosis Date  . COPD (chronic obstructive pulmonary disease) (HCC)   . NSTEMI (non-ST elevated myocardial infarction) (HCC)     12/2011 with normal coronaries possibly secondary to Takotsubo (EF 30-35% by echo), occurred following two episodes of acute respiratory distress  . Hypotension     limiting med titration  . 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 GERD (gastroesophageal reflux disease)       Review of Systems  Constitutional: Negative for fever, chills and fatigue.  HENT: Negative for congestion, postnasal drip, rhinorrhea and sinus pressure.   Respiratory: Positive for shortness of breath. Negative for cough and wheezing.   Cardiovascular: Negative for chest pain, palpitations and leg swelling.       Objective:   Physical Exam Filed Vitals:   02/17/16 1328  BP: 124/66  Pulse: 98  Height: 5\' 6"  (1.676 m)  Weight: 122 lb (55.339 kg)  SpO2: 94%  RA  Ambulated 500 feet on RA and O2 saturation stayed above 95%  Gen: well  appearing, no acute distress HEENT: NCAT, EOMi, OP clear, PULM: Diminished slightly on the right, otherwise clear, no wheezing CV: RRR, no mgr, no JVD, skin well perfused AB: BS+, soft, nontender, no hsm Ext: warm, no edema, no clubbing, no cyanosis  Discharge summary from her March 2017 reviewed were she was hospitalized for an exacerbation of COPD and treated with steroids, discharged on oxygen    Assessment & Plan:   COPD GOLD III Unfortunately Heidi [name] had another exacerbation of COPD associated with an acute influenza infection in March. She seems to have recovered quite well but her energy level is still low. From a breathing standpoint she does not have evidence of an ongoing exacerbation at this time.  Plan: Continue Symbicort Continue when necessary Combivent Continue when necessary nebulized albuterol and Atrovent when in home  Chronic respiratory failure (HCC) She was discharged on oxygen with exertion once again. This has happened to her in the past when she had an exacerbation, she needed oxygen briefly for a few months. So it's not too surprising it's happened once again.  Plan: Continue oxygen at night Check ambulatory oximetry today in clinic  Ex-smoker She will continue in the low-dose lung cancer screening program. However, she would technically be due for a CT scan this month but because she had one last month I will check  with the coordinator to see if it's going to be necessary.    Updated Medication List Outpatient Encounter Prescriptions as of 02/17/2016  Medication Sig  . albuterol (PROVENTIL) (2.5 MG/3ML) 0.083% nebulizer solution Take 3 mLs (2.5 mg total) by nebulization every 6 (six) hours as needed for wheezing or shortness of breath.  . ALPRAZolam (XANAX) 0.25 MG tablet Take 0.25 mg by mouth at bedtime as needed for anxiety.  . budesonide-formoterol (SYMBICORT) 160-4.5 MCG/ACT inhaler Inhale 2 puffs into the lungs 2 (two) times daily.  Marland Kitchen escitalopram  (LEXAPRO) 5 MG tablet Take 20 mg by mouth daily.   . fluticasone (FLONASE) 50 MCG/ACT nasal spray Place 2 sprays into both nostrils daily.  Marland Kitchen ipratropium (ATROVENT) 0.02 % nebulizer solution Take 2.5 mLs (0.5 mg total) by nebulization every 6 (six) hours as needed for wheezing or shortness of breath. 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 Ipratropium-Albuterol (COMBIVENT RESPIMAT) 20-100 MCG/ACT AERS respimat Inhale 1 puff into the lungs every 6 (six) hours as needed for wheezing.  . pantoprazole (PROTONIX) 40 MG tablet Take 40 mg by mouth 2 (two) times daily. Take 30-60 min before first meal of the day  . [DISCONTINUED] budesonide-formoterol (SYMBICORT) 160-4.5 MCG/ACT inhaler Inhale 2 puffs into the lungs 2 (two) times daily.  . [DISCONTINUED] Ipratropium-Albuterol (COMBIVENT RESPIMAT) 20-100 MCG/ACT AERS respimat Inhale 1 puff into the lungs every 6 (six) hours as needed for wheezing.  . [DISCONTINUED] diltiazem (CARDIZEM SR) 60 MG 12 hr capsule Take 1 capsule (60 mg total) by mouth every 12 (twelve) hours. (Patient not taking: Reported on 02/17/2016)  . [DISCONTINUED] ipratropium-albuterol (DUONEB) 0.5-2.5 (3) MG/3ML SOLN Take 3 mLs by nebulization every 4 (four) hours. (Patient not taking: Reported on 02/17/2016)  . [DISCONTINUED] predniSONE (STERAPRED UNI-PAK 21 TAB) 10 MG (21) TBPK tablet Take 1 tablet (10 mg total) by mouth daily. Take 40 mg po daily for 3 days 30 mg po daily for 3 days 20 mg po daily for 3 days 10 mg po daily for 3 days  . [DISCONTINUED] PROAIR HFA 108 (90 BASE) MCG/ACT inhaler INHALE TWO PUFFS BY MOUTH 4 TIMES DAILY AS NEEDED   No facility-administered encounter medications on file as of 02/17/2016.

## 2016-02-17 NOTE — Assessment & Plan Note (Signed)
She was discharged on oxygen with exertion once again. This has happened to her in the past when she had an exacerbation, she needed oxygen briefly for a few months. So it's not too surprising it's happened once again.  Plan: Continue oxygen at night Check ambulatory oximetry today in clinic

## 2016-02-17 NOTE — Patient Instructions (Signed)
I agree completely with pulmonary rehabilitation Keep sleeping with oxygen at night Keep taking your medications as you're doing I will see you back in 4 months or sooner if needed

## 2016-02-17 NOTE — Assessment & Plan Note (Signed)
She will continue in the low-dose lung cancer screening program. However, she would technically be due for a CT scan this month but because she had one last month I will check with the coordinator to see if it's going to be necessary.

## 2016-02-21 ENCOUNTER — Encounter: Payer: Medicare Other | Admitting: *Deleted

## 2016-02-21 DIAGNOSIS — J449 Chronic obstructive pulmonary disease, unspecified: Secondary | ICD-10-CM

## 2016-02-21 NOTE — Progress Notes (Signed)
Daily Session Note  Patient Details  Name: Heidi [name] MRN: [id number] Date of Birth: Mar 31, 1956 Referring Provider:    Encounter Date: 02/21/2016  Check In:     Session Check In - 02/21/16 1200    Check-In   Staff Present Heath Lark, RN, BSN, CCRP;Laureen Owens Shark, BS, RRT, Respiratory Therapist;Eden Toohey, DPT, Ronaldo Miyamoto, BS, ACSM CEP, Exercise Physiologist   Supervising physician immediately available to respond to emergencies [facility] immediately available ER MD   Physician(s) Drs: Dineen Kid and Burlene Arnt   Medication changes reported     No   Fall or balance concerns reported    No   Warm-up and Cool-down Performed on first and last piece of equipment   VAD Patient? No   Pain Assessment   Currently in Pain? No/denies         Goals Met:  Exercise tolerated well Strength training completed today  Goals Unmet:  Not Applicable  Comments: First day of exercise! Patient was oriented to the gym and the equipment functions and settings. Procedures and policies of the gym were outlined and explained. The patient's individual exercise prescription and treatment plan were reviewed with them. All starting workloads were established based on the results of the functional testing  done at the initial intake visit. The plan for exercise progression was also introduced and progression will be customized based on the patient's performance and goals.    Dr. Emily Filbert is Medical Director for [place] and [facility].

## 2016-02-23 DIAGNOSIS — J449 Chronic obstructive pulmonary disease, unspecified: Secondary | ICD-10-CM | POA: Diagnosis not present

## 2016-02-23 NOTE — Progress Notes (Signed)
Daily Session Note  Patient Details  Name: Heidi [name] MRN: [id number] Date of Birth: [birth year] Referring Provider:    Encounter Date: 02/23/2016  Check In:     Session Check In - 02/23/16 1221    Check-In   Location [facility]   Staff Present Nyoka Cowden, RN;Laureen Owens Shark, BS, RRT, Respiratory Therapist;Deroy Noah Brayton El, DPT, [place] physician immediately available to respond to emergencies [facility] immediately available ER MD   Physician(s) Cinda Quest and Joni Fears   Medication changes reported     No   Fall or balance concerns reported    No   Warm-up and Cool-down Performed on first and last piece of equipment   Resistance Training Performed Yes   VAD Patient? No         Goals Met:  Independence with exercise equipment Exercise tolerated well Strength training completed today  Goals Unmet:  Not Applicable  Comments: Patient completed exercise prescription and all exercise goals during rehab session. The exercise was tolerated well and the patient is progressing in the program.    Dr. Emily Filbert is Medical Director for [place] and [facility].

## 2016-02-24 ENCOUNTER — Other Ambulatory Visit: Payer: Self-pay | Admitting: Pulmonary Disease

## 2016-02-25 ENCOUNTER — Encounter: Payer: Medicare Other | Admitting: Respiratory Therapy

## 2016-02-25 DIAGNOSIS — J449 Chronic obstructive pulmonary disease, unspecified: Secondary | ICD-10-CM | POA: Diagnosis not present

## 2016-02-25 NOTE — Progress Notes (Signed)
Daily Session Note  Patient Details  Name: MAHKAYLA PREECE MRN: [id number] Date of Birth: Aug 30, 1956 Referring Provider:    Encounter Date: 02/25/2016  Check In:     Session Check In - 02/25/16 1253    Check-In   Staff Present Nyoka Cowden, RN;Susanne Bice, RN, BSN, CCRP;Raahi Korber Blanch Media, RRT, RCP, Respiratory Therapist   Supervising physician immediately available to respond to emergencies [facility] immediately available ER MD   Physician(s) Dr. Edd Fabian and Dr. Jimmye Norman   Medication changes reported     No   Fall or balance concerns reported    No   Warm-up and Cool-down Performed on first and last piece of equipment   Resistance Training Performed Yes   VAD Patient? No   Pain Assessment   Currently in Pain? No/denies         Goals Met:  Proper associated with RPD/PD & O2 Sat Independence with exercise equipment Exercise tolerated well Strength training completed today  Goals Unmet:  Not Applicable  Comments:  Dr. Emily Filbert is Medical Director for [place] and [facility].

## 2016-02-28 ENCOUNTER — Encounter: Payer: Medicare Other | Attending: Pulmonary Disease | Admitting: *Deleted

## 2016-02-28 DIAGNOSIS — J449 Chronic obstructive pulmonary disease, unspecified: Secondary | ICD-10-CM | POA: Insufficient documentation

## 2016-02-28 NOTE — Progress Notes (Signed)
Daily Session Note  Patient Details  Name: Lundyn M Crupi MRN: [id number] Date of Birth: 11/20/1955 Referring Provider:    Encounter Date: 02/28/2016  Check In:     Session Check In - 02/28/16 1221    Check-In   Location [facility]   Staff Present Laureen Brown, BS, RRT, Respiratory Therapist;Carroll Enterkin, RN, BSN;Rebecca Sickles, DPT, CEEA   Supervising physician immediately available to respond to emergencies [facility] immediately available ER MD   Physician(s) Dr. Stafford and Dr. McShane   Medication changes reported     No   Fall or balance concerns reported    No   Warm-up and Cool-down Performed on first and last piece of equipment   Resistance Training Performed Yes   VAD Patient? No   Pain Assessment   Currently in Pain? No/denies         Goals Met:  Proper associated with RPD/PD & O2 Sat Exercise tolerated well  Goals Unmet:  Not Applicable  Comments:    Dr. Mark Miller is Medical Director for [facility] and [facility]. 

## 2016-03-06 ENCOUNTER — Encounter: Payer: Medicare Other | Admitting: *Deleted

## 2016-03-06 DIAGNOSIS — J449 Chronic obstructive pulmonary disease, unspecified: Secondary | ICD-10-CM

## 2016-03-06 NOTE — Progress Notes (Signed)
Daily Session Note  Patient Details  Name: Heidi [name] MRN: [id number] Date of Birth: 1956-10-03 Referring Provider:    Encounter Date: 03/06/2016  Check In:     Session Check In - 03/06/16 1309    Check-In   Location [facility]   Staff Present Carson Myrtle, BS, RRT, Respiratory Therapist;Enrico Eaddy, RN, Jana Half, RN, BSN   Supervising physician immediately available to respond to emergencies [facility] immediately available ER MD   Physician(s) Dr. Reita Cliche and Dr. Clearnce Hasten   Medication changes reported     No   Fall or balance concerns reported    No   Warm-up and Cool-down Performed on first and last piece of equipment   Resistance Training Performed Yes   VAD Patient? No   Pain Assessment   Currently in Pain? No/denies         Goals Met:  Proper associated with RPD/PD & O2 Sat Exercise tolerated well  Goals Unmet:  Not Applicable  Comments:     Dr. Emily Filbert is Medical Director for [place] and [facility].

## 2016-03-08 ENCOUNTER — Encounter: Payer: Medicare Other | Admitting: *Deleted

## 2016-03-08 DIAGNOSIS — J449 Chronic obstructive pulmonary disease, unspecified: Secondary | ICD-10-CM

## 2016-03-08 NOTE — Progress Notes (Signed)
Daily Session Note  Patient Details  Name: Heidi [name] MRN: [id number] Date of Birth: Jul 27, 1956 Referring Provider:    Encounter Date: 03/08/2016  Check In:     Session Check In - 03/08/16 1230    Check-In   Location [facility]   Staff Present Gerlene Burdock, RN, BSN;Laureen Owens Shark, BS, RRT, Respiratory Therapist;Rebecca Brayton El, DPT, CEEA   Supervising physician immediately available to respond to emergencies [facility] immediately available ER MD   Physician(s) Dr. Lovena Le and Dr. Jimmye Norman   Medication changes reported     No   Fall or balance concerns reported    No   Warm-up and Cool-down Performed on first and last piece of equipment   Resistance Training Performed Yes   VAD Patient? No   Pain Assessment   Currently in Pain? No/denies         Goals Met:  Proper associated with RPD/PD & O2 Sat Exercise tolerated well  Goals Unmet:  Not Applicable  Comments:     Dr. Emily Filbert is Medical Director for [place] and [facility].

## 2016-03-12 ENCOUNTER — Other Ambulatory Visit: Payer: Self-pay | Admitting: Pulmonary Disease

## 2016-03-13 ENCOUNTER — Encounter: Payer: Self-pay | Admitting: *Deleted

## 2016-03-13 ENCOUNTER — Encounter: Payer: Medicare Other | Admitting: *Deleted

## 2016-03-13 DIAGNOSIS — J449 Chronic obstructive pulmonary disease, unspecified: Secondary | ICD-10-CM

## 2016-03-13 NOTE — Progress Notes (Signed)
Pulmonary Individual Treatment Plan  Patient Details  Name: Heidi [name] MRN: [id number] Date of Birth: 04-11-1956 Referring Provider:    Initial Encounter Date:       Pulmonary Rehab from 02/15/2016 in [facility] Cardiac and Pulmonary Rehab   Date  02/15/16      Visit Diagnosis: COPD, moderate ([place])  Patient's Home Medications on Admission:  Current outpatient prescriptions:  .  albuterol (PROVENTIL) (2.5 MG/3ML) 0.083% nebulizer solution, USE ONE VIAL IN NEBULIZER EVERY 6 HOURS AS NEEDED FOR WHEEZING OR SHORTNESS OF BREATH, Disp: 360 mL, Rfl: 5 .  ALPRAZolam (XANAX) 0.25 MG tablet, Take 0.25 mg by mouth at bedtime as needed for anxiety., Disp: , Rfl:  .  budesonide-formoterol (SYMBICORT) 160-4.5 MCG/ACT inhaler, Inhale 2 puffs into the lungs 2 (two) times daily., Disp: 1 Inhaler, Rfl: 6 .  escitalopram (LEXAPRO) 5 MG tablet, Take 20 mg by mouth daily. , Disp: , Rfl:  .  fluticasone (FLONASE) 50 MCG/ACT nasal spray, USE TWO SPRAY(S) IN EACH NOSTRIL ONCE DAILY, Disp: 16 g, Rfl: 5 .  ipratropium (ATROVENT) 0.02 % nebulizer solution, Take 2.5 mLs (0.5 mg total) by nebulization every 6 (six) hours as needed for wheezing or shortness of breath. Dx Code J43.2, Disp: 360 mL, Rfl: 3 .  ipratropium (ATROVENT) 0.02 % nebulizer solution, INHALE ONE VIAL EVERY 6 HOURS AS NEEDED FOR WHEEZING OR SHORTNESS OF BREATH, Disp: 360 mL, Rfl: 5 .  Ipratropium-Albuterol (COMBIVENT RESPIMAT) 20-100 MCG/ACT AERS respimat, Inhale 1 puff into the lungs every 6 (six) hours as needed for wheezing., Disp: 1 Inhaler, Rfl: 5 .  pantoprazole (PROTONIX) 40 MG tablet, Take 40 mg by mouth 2 (two) times daily. Take 30-60 min before first meal of the day, Disp: , Rfl:   Past Medical History: Past Medical History  Diagnosis Date  . COPD (chronic obstructive pulmonary disease) ([place])   . NSTEMI (non-ST elevated myocardial infarction) ([place])     12/2011 with normal coronaries possibly secondary to Takotsubo (EF 30-35% by echo),  occurred following two episodes of acute respiratory distress  . Hypotension     limiting med titration  . 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 GERD (gastroesophageal reflux disease)     Tobacco Use: History  Smoking status  . Former Smoker -- 1.00 packs/day for 30 years  . Types: Cigarettes  . Quit date: 01/21/2012  Smokeless tobacco  . Never Used    Labs: Recent Review Flowsheet Data    Labs for ITP Cardiac and Pulmonary Rehab Latest Ref Rng 01/23/2012 01/24/2012 02/01/2012 08/08/2012 08/08/2012   Cholestrol 0 - 200 mg/dL - 134 - - -   LDLCALC 0 - 99 mg/dL - 41 - - -   HDL >39 mg/dL - 80 - - -   Trlycerides <150 mg/dL - 65 - - -   Hemoglobin A1c <5.7 % 5.6 - - - -   PHART 7.350 - 7.450 7.391 - 7.409(H) 7.206(L) 7.332(L)   PCO2ART 35.0 - 45.0 mmHg 38.4 - 39.4 46.5(H) 47.3(H)   HCO3 20.0 - 24.0 mEq/L 23.3 - 24.4(H) 18.4(L) 25.1(H)   TCO2 0 - 100 mmol/L 24 - 21.'3 20 27   ' ACIDBASEDEF 0.0 - 2.0 mmol/L 1.0 - - 9.0(H) 1.0   O2SAT - 98.0 - 94.1 100.0 99.0       ADL UCSD:     Pulmonary Assessment Scores      02/15/16 1330       ADL UCSD   ADL Phase Entry     SOB Score  total 55     Rest 0     Walk 1     Stairs 3     Bath 1     Dress 1     Shop 2        Pulmonary Function Assessment:     Pulmonary Function Assessment - 02/15/16 1420    Pulmonary Function Tests   RV% 178 %   DLCO% 48 %   Initial Spirometry Results   FVC% 45 %   FEV1% 27 %   FEV1/FVC Ratio 45   Post Bronchodilator Spirometry Results   FVC% 63 %   FEV1% 32 %   FEV1/FVC Ratio 38   Breath   Bilateral Breath Sounds Clear;Decreased   Shortness of Breath Yes;Fear of Shortness of Breath;Limiting activity      Exercise Target Goals:    Exercise Program Goal: Individual exercise prescription set with THRR, safety & activity barriers. Participant demonstrates ability to understand and report RPE using BORG scale, to self-measure pulse accurately, and to acknowledge the importance of the exercise  prescription.  Exercise Prescription Goal: Starting with aerobic activity 30 plus minutes a day, 3 days per week for initial exercise prescription. Provide home exercise prescription and guidelines that participant acknowledges understanding prior to discharge.  Activity Barriers & Risk Stratification:     Activity Barriers & Cardiac Risk Stratification - 02/15/16 1330    Activity Barriers & Cardiac Risk Stratification   Activity Barriers Shortness of Breath;Deconditioning   Cardiac Risk Stratification Moderate      6 Minute Walk:     6 Minute Walk      02/15/16 1435 02/15/16 1447     6 Minute Walk   Phase Initial     Distance 1070 feet     Walk Time 6 minutes     MPH 2     RPE 12     Resting HR 88 bpm     Resting BP 118/68 mmHg     Max Ex. HR 91 bpm     Max Ex. BP 122/72 mmHg     Interval Oxygen   Interval Oxygen?  Yes    Baseline Oxygen Saturation %  95 %    6 Minute Oxygen Saturation %  95 %    2 Minute Post Oxygen Saturation %  95 %       Initial Exercise Prescription:     Initial Exercise Prescription - 02/15/16 1400    Date of Initial Exercise RX and Referring Provider   Date 02/15/16   Treadmill   MPH 1.5   Grade 0   Minutes 15  May exercise in bouts with intermittant rest periods    Recumbant Bike   Level 2   Watts 20   Minutes 15   NuStep   Level 2   Watts 40   Minutes 15   Recumbant Elliptical   Level 2   Watts 20   Minutes 15   REL-XR   Level 2   Watts 50   Minutes 15   Biostep-RELP   Level 1   Watts 15   Minutes 15   Prescription Details   Frequency (times per week) 3   Duration Progress to 45 minutes of aerobic exercise without signs/symptoms of physical distress   Intensity   THRR REST +  30   Ratings of Perceived Exertion 11-15   Perceived Dyspnea 0-4   Progression   Progression Continue to progress workloads to maintain intensity without signs/symptoms of  physical distress.   Resistance Training   Training Prescription  Yes   Weight 2   Reps 10-15      Perform Capillary Blood Glucose checks as needed.  Exercise Prescription Changes:     Exercise Prescription Changes      02/22/16 0700 02/28/16 1400 03/08/16 1200       Exercise Review   Progression  Yes Yes     Response to Exercise   Blood Pressure (Admit) 130/80 mmHg 118/78 mmHg 118/78 mmHg     Blood Pressure (Exercise) 138/78 mmHg 148/70 mmHg 148/70 mmHg     Blood Pressure (Exit) 114/64 mmHg 124/66 mmHg 124/66 mmHg     Heart Rate (Admit) 99 bpm 78 bpm 78 bpm     Heart Rate (Exercise) 98 bpm 114 bpm 114 bpm     Heart Rate (Exit) 74 bpm 87 bpm 87 bpm     Oxygen Saturation (Admit) 94 % 95 % 95 %     Oxygen Saturation (Exercise) 92 % 96 % 96 %     Oxygen Saturation (Exit) 97 % 95 % 95 %     Rating of Perceived Exertion (Exercise) '12 12 12     ' Perceived Dyspnea (Exercise) 3 2.5 2.5     Comments data entry for 02/21/2016 - Heidi [name]'s first day of exercise in [employer].        Duration  Progress to 50 minutes of aerobic without signs/symptoms of physical distress Progress to 50 minutes of aerobic without signs/symptoms of physical distress     Intensity  Rest + 30 Rest + 30     Resistance Training   Training Prescription Yes Yes Yes     Weight '2 2 3     ' Reps 10-12 10-12 10-12     Treadmill   MPH 1.5 1.5 1.5     Grade 0 0 0     Minutes '15 15 15     ' NuStep   Level '2 2 2     ' Watts 40 40 40     Minutes '10 15 15     ' REL-XR   Level   3     Watts   60     Biostep-RELP   Level '2 2 2     ' Watts '20 20 20     ' Minutes '15 15 15        ' Exercise Comments:   Discharge Exercise Prescription (Final Exercise Prescription Changes):     Exercise Prescription Changes - 03/08/16 1200    Exercise Review   Progression Yes   Response to Exercise   Blood Pressure (Admit) 118/78 mmHg   Blood Pressure (Exercise) 148/70 mmHg   Blood Pressure (Exit) 124/66 mmHg   Heart Rate (Admit) 78 bpm   Heart Rate (Exercise) 114 bpm   Heart Rate (Exit) 87 bpm    Oxygen Saturation (Admit) 95 %   Oxygen Saturation (Exercise) 96 %   Oxygen Saturation (Exit) 95 %   Rating of Perceived Exertion (Exercise) 12   Perceived Dyspnea (Exercise) 2.5   Duration Progress to 50 minutes of aerobic without signs/symptoms of physical distress   Intensity Rest + 30   Resistance Training   Training Prescription Yes   Weight 3   Reps 10-12   Treadmill   MPH 1.5   Grade 0   Minutes 15   NuStep   Level 2   Watts 40   Minutes 15   REL-XR   Level 3   Watts 60  Biostep-RELP   Level 2   Watts 20   Minutes 15       Nutrition:  Target Goals: Understanding of nutrition guidelines, daily intake of sodium <1535m, cholesterol <2030m calories 30% from fat and 7% or less from saturated fats, daily to have 5 or more servings of fruits and vegetables.  Biometrics:     Pre Biometrics - 02/15/16 1438    Pre Biometrics   Height 5' 5.5" (1.664 m)   Weight 122 lb 6.4 oz (55.52 kg)   Waist Circumference 37 inches   Hip Circumference 35 inches   Waist to Hip Ratio 1.06 %   BMI (Calculated) 20.1       Nutrition Therapy Plan and Nutrition Goals:     Nutrition Therapy & Goals - 02/15/16 1330    Nutrition Therapy   Diet Heidi [name] like to meet with the dietitian; large portion sizes, but low salt and sweets; she does like fruits and vegetables; she has cut her soda to 2 cans a day.      Nutrition Discharge: Rate Your Plate Scores:   Psychosocial: Target Goals: Acknowledge presence or absence of depression, maximize coping skills, provide positive support system. Participant is able to verbalize types and ability to use techniques and skills needed for reducing stress and depression.  Initial Review & Psychosocial Screening:     Initial Psych Review & Screening - 02/15/16 1330    Family Dynamics   Good Support System? Yes   Comments Heidi [name] good support from her boyfriend and daughter. Her hospital admission for COPD exacerbation in March  was quite serious and is a concern for her to manage her COPD.   Barriers   Psychosocial barriers to participate in program The patient should benefit from training in stress management and relaxation.   Screening Interventions   Interventions Encouraged to exercise;Program counselor consult      Quality of Life Scores:     Quality of Life - 02/15/16 1435    Quality of Life Scores   Health/Function Pre 18.5 %   Socioeconomic Pre 23.29 %   Psych/Spiritual Pre 22.86 %   Family Pre 28.8 %   GLOBAL Pre 21.8 %      PHQ-9:     Recent Review Flowsheet Data    [facility]   Decreased Interest 0   Down, Depressed, Hopeless 0   PHQ - 2 Score 0   Altered sleeping 1   Tired, decreased energy 1   Change in appetite 1   Feeling bad or failure about yourself  0   Trouble concentrating 0   Moving slowly or fidgety/restless 0   Suicidal thoughts 0   PHQ-9 Score 3   Difficult doing work/chores Not difficult at all      Psychosocial Evaluation and Intervention:     Psychosocial Evaluation - 02/21/16 1238    Psychosocial Evaluation & Interventions   Interventions Encouraged to exercise with the program and follow exercise prescription   Comments Counselor met with Heidi. [name] for initial psychosocial evaluation.  She is almost [age] old and was diagnosed with COPD in 2013.  Heidi. [name] been in this program before, but had the flu one month ago and that has exacerbated her health condition.  She has a strong support system with a daughter and son who live close by and a significant other who is in the same home with her.  Heidi. [name] she sleeps well  now with Oxygen now and takes a Xanax & Unisom PRN.  She does not have a great appetite.  She denies a history of depression but has struggled with Anxiety and has been on Lexapro for 2 years to address this.  She reports no current symptoms of either at this time.  Heidi. [name] states she is typically in a  positive mood, and other than her health she has minimal stress.  Her goals for this program are to breathe better and improve her health overall.  She plans to return to the [employer] [facility] as a follow up upon completion of pulmonary rehab.     Continued Psychosocial Services Needed Yes  Heidi. [name] will benefit from the psychoeducational components of this program, especially stress management.        Psychosocial Re-Evaluation:  Education: Education Goals: Education classes will be provided on a weekly basis, covering required topics. Participant will state understanding/return demonstration of topics presented.  Learning Barriers/Preferences:     Learning Barriers/Preferences - 02/15/16 1330    Learning Barriers/Preferences   Learning Barriers None   Learning Preferences Group Instruction;Individual Instruction;Pictoral;Skilled Demonstration;Verbal Instruction;Video;Written Material      Education Topics: Initial Evaluation Education: - Verbal, written and demonstration of respiratory meds, RPE/PD scales, oximetry and breathing techniques. Instruction on use of nebulizers and MDIs: cleaning and proper use, rinsing mouth with steroid doses and importance of monitoring MDI activations.          Pulmonary Rehab from 02/25/2016 in [facility] Cardiac and Pulmonary Rehab   Date  02/15/16   Educator  LB   Instruction Review Code  2- meets goals/outcomes      General Nutrition Guidelines/Fats and Fiber: -Group instruction provided by verbal, written material, models and posters to present the general guidelines for heart healthy nutrition. Gives an explanation and review of dietary fats and fiber.   Controlling Sodium/Reading Food Labels: -Group verbal and written material supporting the discussion of sodium use in heart healthy nutrition. Review and explanation with models, verbal and written materials for utilization of the food label.   Exercise Physiology & Risk  Factors: - Group verbal and written instruction with models to review the exercise physiology of the cardiovascular system and associated critical values. Details cardiovascular disease risk factors and the goals associated with each risk factor.   Aerobic Exercise & Resistance Training: - Gives group verbal and written discussion on the health impact of inactivity. On the components of aerobic and resistive training programs and the benefits of this training and how to safely progress through these programs.   Flexibility, Balance, General Exercise Guidelines: - Provides group verbal and written instruction on the benefits of flexibility and balance training programs. Provides general exercise guidelines with specific guidelines to those with heart or lung disease. Demonstration and skill practice provided.      Pulmonary Rehab from 03/08/2016 in [facility] Cardiac and Pulmonary Rehab   Date  03/08/16   Educator  Salley Hews, PT   Instruction Review Code  2- meets goals/outcomes      Stress Management: - Provides group verbal and written instruction about the health risks of elevated stress, cause of high stress, and healthy ways to reduce stress.   Depression: - Provides group verbal and written instruction on the correlation between heart/lung disease and depressed mood, treatment options, and the stigmas associated with seeking treatment.   Exercise & Equipment Safety: - Individual verbal instruction and demonstration of equipment use and safety with use of  the equipment.      Pulmonary Rehab from 03/08/2016 in [facility] Cardiac and Pulmonary Rehab   Date  02/28/16   Educator  C. EnterkinRN   Instruction Review Code  2- meets goals/outcomes      Infection Prevention: - Provides verbal and written material to individual with discussion of infection control including proper hand washing and proper equipment cleaning during exercise session.   Falls Prevention: - Provides verbal and  written material to individual with discussion of falls prevention and safety.      Pulmonary Rehab from 02/25/2016 in [facility] Cardiac and Pulmonary Rehab   Date  02/15/16   Educator  LB   Instruction Review Code  2- meets goals/outcomes      Diabetes: - Individual verbal and written instruction to review signs/symptoms of diabetes, desired ranges of glucose level fasting, after meals and with exercise. Advice that pre and post exercise glucose checks will be done for 3 sessions at entry of program.   Chronic Lung Diseases: - Group verbal and written instruction to review new updates, new respiratory medications, new advancements in procedures and treatments. Provide informative websites and "800" numbers of self-education.      Pulmonary Rehab from 03/08/2016 in [facility] Cardiac and Pulmonary Rehab   Date  03/06/16   Educator  L. Brown,RT   Instruction Review Code  2- meets goals/outcomes      Lung Procedures: - Group verbal and written instruction to describe testing methods done to diagnose lung disease. Review the outcome of test results. Describe the treatment choices: Pulmonary Function Tests, ABGs and oximetry.   Energy Conservation: - Provide group verbal and written instruction for methods to conserve energy, plan and organize activities. Instruct on pacing techniques, use of adaptive equipment and posture/positioning to relieve shortness of breath.   Triggers: - Group verbal and written instruction to review types of environmental controls: home humidity, furnaces, filters, dust mite/pet prevention, HEPA vacuums. To discuss weather changes, air quality and the benefits of nasal washing.      Pulmonary Rehab from 02/25/2016 in [facility] Cardiac and Pulmonary Rehab   Date  02/21/16   Educator  LB   Instruction Review Code  2- meets goals/outcomes      Exacerbations: - Group verbal and written instruction to provide: warning signs, infection symptoms, calling MD promptly, preventive  modes, and value of vaccinations. Review: effective airway clearance, coughing and/or vibration techniques. Create an Sports administrator.   Oxygen: - Individual and group verbal and written instruction on oxygen therapy. Includes supplement oxygen, available portable oxygen systems, continuous and intermittent flow rates, oxygen safety, concentrators, and Medicare reimbursement for oxygen.   Respiratory Medications: - Group verbal and written instruction to review medications for lung disease. Drug class, frequency, complications, importance of spacers, rinsing mouth after steroid MDI's, and proper cleaning methods for nebulizers.      Pulmonary Rehab from 02/25/2016 in [facility] Cardiac and Pulmonary Rehab   Date  02/15/16   Educator  LB   Instruction Review Code  2- meets goals/outcomes      AED/CPR: - Group verbal and written instruction with the use of models to demonstrate the basic use of the AED with the basic ABC's of resuscitation.   Breathing Retraining: - Provides individuals verbal and written instruction on purpose, frequency, and proper technique of diaphragmatic breathing and pursed-lipped breathing. Applies individual practice skills.      Pulmonary Rehab from 02/25/2016 in [facility] Cardiac and Pulmonary Rehab   Date  02/15/16  Educator  LB   Instruction Review Code  2- meets goals/outcomes      Anatomy and Physiology of the Lungs: - Group verbal and written instruction with the use of models to provide basic lung anatomy and physiology related to function, structure and complications of lung disease.   Heart Failure: - Group verbal and written instruction on the basics of heart failure: signs/symptoms, treatments, explanation of ejection fraction, enlarged heart and cardiomyopathy.      Pulmonary Rehab from 02/25/2016 in [facility] Cardiac and Pulmonary Rehab   Date  02/25/16   Educator  [place]   Instruction Review Code  2- meets goals/outcomes      Sleep Apnea: - Individual verbal  and written instruction to review Obstructive Sleep Apnea. Review of risk factors, methods for diagnosing and types of masks and machines for OSA.   Anxiety: - Provides group, verbal and written instruction on the correlation between heart/lung disease and anxiety, treatment options, and management of anxiety.   Relaxation: - Provides group, verbal and written instruction about the benefits of relaxation for patients with heart/lung disease. Also provides patients with examples of relaxation techniques.   Knowledge Questionnaire Score:     Knowledge Questionnaire Score - 02/15/16 1330    Knowledge Questionnaire Score   Pre Score 10/10       Core Components/Risk Factors/Patient Goals at Admission:     Personal Goals and Risk Factors at Admission - 02/15/16 1330    Core Components/Risk Factors/Patient Goals on Admission    Weight Management Yes;Weight Loss   Intervention Weight Management: Develop a combined nutrition and exercise program designed to reach desired caloric intake, while maintaining appropriate intake of nutrient and fiber, sodium and fats, and appropriate energy expenditure required for the weight goal.;Weight Management: Provide education and appropriate resources to help participant work on and attain dietary goals.;Weight Management/Obesity: Establish reasonable short term and long term weight goals.   Admit Weight 125 lb (56.7 kg)   Goal Weight: Short Term 120 lb (54.432 kg)   Goal Weight: Long Term 115 lb (52.164 kg)   Expected Outcomes Short Term: Continue to assess and modify interventions until short term weight is achieved;Long Term: Adherence to nutrition and physical activity/exercise program aimed toward attainment of established weight goal;Weight Maintenance: Understanding of the daily nutrition guidelines, which includes 25-35% calories from fat, 7% or less cal from saturated fats, less than 24m cholesterol, less than 1.5gm of sodium, & 5 or more servings  of fruits and vegetables daily;Weight Loss: Understanding of general recommendations for a balanced deficit meal plan, which promotes 1-2 lb weight loss per week and includes a negative energy balance of [phone] kcal/d;Understanding recommendations for meals to include 15-35% energy as protein, 25-35% energy from fat, 35-60% energy from carbohydrates, less than 202mof dietary cholesterol, 20-35 gm of total fiber daily;Understanding of distribution of calorie intake throughout the day with the consumption of 4-5 meals/snacks   Sedentary Yes   Intervention Provide advice, education, support and counseling about physical activity/exercise needs.;Develop an individualized exercise prescription for aerobic and resistive training based on initial evaluation findings, risk stratification, comorbidities and participant's personal goals.   Expected Outcomes Achievement of increased cardiorespiratory fitness and enhanced flexibility, muscular endurance and strength shown through measurements of functional capacity and personal statement of participant.   Increase Strength and Stamina Yes   Intervention Provide advice, education, support and counseling about physical activity/exercise needs.;Develop an individualized exercise prescription for aerobic and resistive training based on initial evaluation findings, risk stratification,  comorbidities and participant's personal goals.   Expected Outcomes Achievement of increased cardiorespiratory fitness and enhanced flexibility, muscular endurance and strength shown through measurements of functional capacity and personal statement of participant.   Improve shortness of breath with ADL's Yes   Intervention Provide education, individualized exercise plan and daily activity instruction to help decrease symptoms of SOB with activities of daily living.   Expected Outcomes Short Term: Achieves a reduction of symptoms when performing activities of daily living.   Develop more  efficient breathing techniques such as purse lipped breathing and diaphragmatic breathing; and practicing self-pacing with activity Yes   Intervention Provide education, demonstration and support about specific breathing techniuqes utilized for more efficient breathing. Include techniques such as pursed lipped breathing, diaphragmatic breathing and self-pacing activity.   Expected Outcomes Short Term: Participant will be able to demonstrate and use breathing techniques as needed throughout daily activities.   Increase knowledge of respiratory medications and ability to use respiratory devices properly  Yes  MDI's Symbicort, Ventolin, Combivent; SVN Albuterol and Atrovent   Intervention Provide education and demonstration as needed of appropriate use of medications, inhalers, and oxygen therapy.   Expected Outcomes Short Term: Achieves understanding of medications use. Understands that oxygen is a medication prescribed by physician. Demonstrates appropriate use of inhaler and oxygen therapy.      Core Components/Risk Factors/Patient Goals Review:      Goals and Risk Factor Review      02/22/16 0707 03/06/16 1130         Core Components/Risk Factors/Patient Goals Review   Personal Goals Review Develop more efficient breathing techniques such as purse lipped breathing and diaphragmatic breathing and practicing self-pacing with activity. Increase Strength and Stamina;Improve shortness of breath with ADL's;Increase knowledge of respiratory medications and ability to use respiratory devices properly.      Review Heidi [name] first day of exercise in [place] - she had been in the program before and adjusted to the exercise goals and equipment set-up. Using PLB is part of her daily management of her shortness of breath with activies. She has good technique and needs no queing with her exerciae goals in [facility].  Heidi [name] has noticed an improvement in her shortness of breath since starting [facility].  She knows her limits and knows when to stop and rest. Her stamina and energy level has increased since she was so sick with the flu.  We discussed her MDI's and  is using her spacer . When she goes out for the day, Heidi [name]  will take a SVN treatment  for her breathing.      Expected Outcomes Continue using PLB daily for activity at home and with exercise goals in [facility].          Core Components/Risk Factors/Patient Goals at Discharge (Final Review):      Goals and Risk Factor Review - 03/06/16 1130    Core Components/Risk Factors/Patient Goals Review   Personal Goals Review Increase Strength and Stamina;Improve shortness of breath with ADL's;Increase knowledge of respiratory medications and ability to use respiratory devices properly.   Review Heidi [name] has noticed an improvement in her shortness of breath since starting [facility]. She knows her limits and knows when to stop and rest. Her stamina and energy level has increased since she was so sick with the flu.  We discussed her MDI's and  is using her spacer . When she goes out for the day, Heidi [name]  will take a SVN treatment  for her breathing.  ITP Comments:   Comments: 30 day review

## 2016-03-15 DIAGNOSIS — J449 Chronic obstructive pulmonary disease, unspecified: Secondary | ICD-10-CM

## 2016-03-15 NOTE — Progress Notes (Signed)
Daily Session Note  Patient Details  Name: Heidi [name] MRN: [id number] Date of Birth: 07/25/1956 Referring Provider:    Encounter Date: 03/15/2016  Check In:     Session Check In - 03/15/16 1230    Check-In   Location [facility]   Staff Present Laureen Brown, BS, RRT, Respiratory Therapist;Carroll Enterkin, RN, BSN;Rebecca Sickles, DPT, CEEA;Amanda Sommer, BA, ACSM CEP, Exercise Physiologist   Supervising physician immediately available to respond to emergencies [facility] immediately available ER MD   Physician(s) Drs Stafford and Schaevitz   Medication changes reported     No   Fall or balance concerns reported    No   Warm-up and Cool-down Performed on first and last piece of equipment   Resistance Training Performed Yes   VAD Patient? No   Pain Assessment   Currently in Pain? No/denies         Goals Met:  Proper associated with RPD/PD & O2 Sat Independence with exercise equipment Exercise tolerated well Strength training completed today  Goals Unmet:  Not Applicable  Comments: [name] is progressing well with exercise   Dr. Mark Miller is Medical Director for [facility] and [facility]. 

## 2016-03-22 ENCOUNTER — Encounter: Payer: Medicare Other | Admitting: *Deleted

## 2016-03-22 DIAGNOSIS — J449 Chronic obstructive pulmonary disease, unspecified: Secondary | ICD-10-CM | POA: Diagnosis not present

## 2016-03-22 NOTE — Progress Notes (Signed)
Daily Session Note  Patient Details  Name: Heidi [name] MRN: [id number] Date of Birth: August 30, 1956 Referring Provider:    Encounter Date: 03/22/2016  Check In:     Session Check In - 03/22/16 1213    Check-In   Location [facility]   Staff Present Alberteen Sam, MA, ACSM RCEP, Exercise Physiologist;Laureen Owens Shark, BS, RRT, Respiratory Therapist;Carroll Enterkin, RN, Alex Gardener, DPT, CEEA   Supervising physician immediately available to respond to emergencies [facility] immediately available ER MD   Physician(s) Cinda Quest and [place]   Medication changes reported     No   Fall or balance concerns reported    No   Warm-up and Cool-down Performed on first and last piece of equipment   Resistance Training Performed Yes   VAD Patient? No   Pain Assessment   Currently in Pain? No/denies   Multiple Pain Sites No         Goals Met:  Proper associated with RPD/PD & O2 Sat Independence with exercise equipment Improved SOB with ADL's Using PLB without cueing & demonstrates good technique Exercise tolerated well Personal goals reviewed Strength training completed today  Goals Unmet:  Not Applicable  Comments: Pt able to follow exercise prescription today without complaint.  Will continue to monitor for progression.  See ITP for goal review.   Dr. Emily Filbert is Medical Director for [place] and [facility].

## 2016-03-29 DIAGNOSIS — J449 Chronic obstructive pulmonary disease, unspecified: Secondary | ICD-10-CM

## 2016-03-29 NOTE — Progress Notes (Signed)
Daily Session Note  Patient Details  Name: Heidi [name] MRN: [id number] Date of Birth: 08-07-1956 Referring Provider:    Encounter Date: 03/29/2016  Check In:     Session Check In - 03/29/16 1208    Check-In   Location [facility]   Staff Present Jeanell Sparrow, DPT, Burlene Arnt, BA, ACSM CEP, Exercise Physiologist;Laureen Owens Shark, BS, RRT, Respiratory Therapist;Carroll Enterkin, RN, BSN   Supervising physician immediately available to respond to emergencies [facility] immediately available ER MD   Physician(s) Drs Jacqualine Code and Jimmye Norman   Medication changes reported     No   Fall or balance concerns reported    No   Warm-up and Cool-down Performed on first and last piece of equipment   Resistance Training Performed Yes   VAD Patient? No   Pain Assessment   Currently in Pain? No/denies         Goals Met:  Proper associated with RPD/PD & O2 Sat Independence with exercise equipment Exercise tolerated well Strength training completed today  Goals Unmet:  Not Applicable  Comments: Pt able to follow exercise prescription today without complaint.  Will continue to monitor for progression.   Dr. Emily Filbert is Medical Director for [place] and [facility].

## 2016-03-31 ENCOUNTER — Encounter: Payer: Medicare Other | Attending: Pulmonary Disease

## 2016-03-31 DIAGNOSIS — J449 Chronic obstructive pulmonary disease, unspecified: Secondary | ICD-10-CM | POA: Insufficient documentation

## 2016-04-03 DIAGNOSIS — J449 Chronic obstructive pulmonary disease, unspecified: Secondary | ICD-10-CM

## 2016-04-03 NOTE — Progress Notes (Signed)
Daily Session Note  Patient Details  Name: Heidi [name] MRN: [id number] Date of Birth: 03-Feb-1956 Referring Provider:    Encounter Date: 04/03/2016  Check In:     Session Check In - 04/03/16 1256    Check-In   Location [facility]   Staff Present Carson Myrtle, BS, RRT, Respiratory Therapist;Kelly Amedeo Plenty, BS, ACSM CEP, Exercise Physiologist;Amanda Oletta Darter, BA, ACSM CEP, Exercise Physiologist   Supervising physician immediately available to respond to emergencies [facility] immediately available ER MD   Physician(s) Cinda Quest and [place]   Medication changes reported     No   Fall or balance concerns reported    No   Warm-up and Cool-down Performed on first and last piece of equipment   Resistance Training Performed No   VAD Patient? No   Pain Assessment   Currently in Pain? No/denies   Multiple Pain Sites No         Goals Met:  Proper associated with RPD/PD & O2 Sat Independence with exercise equipment Exercise tolerated well  Goals Unmet:  Not Applicable  Comments: Pt able to follow exercise prescription today without complaint.  Will continue to monitor for progression.   Dr. Emily Filbert is Medical Director for [place] and [facility].

## 2016-04-05 IMAGING — DX DG CHEST 1V PORT
1 series · 1 of 1 positions shown · non-contrast
Comparison: 11/30/2015

CLINICAL DATA: Shortness of breath and chest pain

EXAM:
PORTABLE CHEST 1 VIEW

[chest ap]
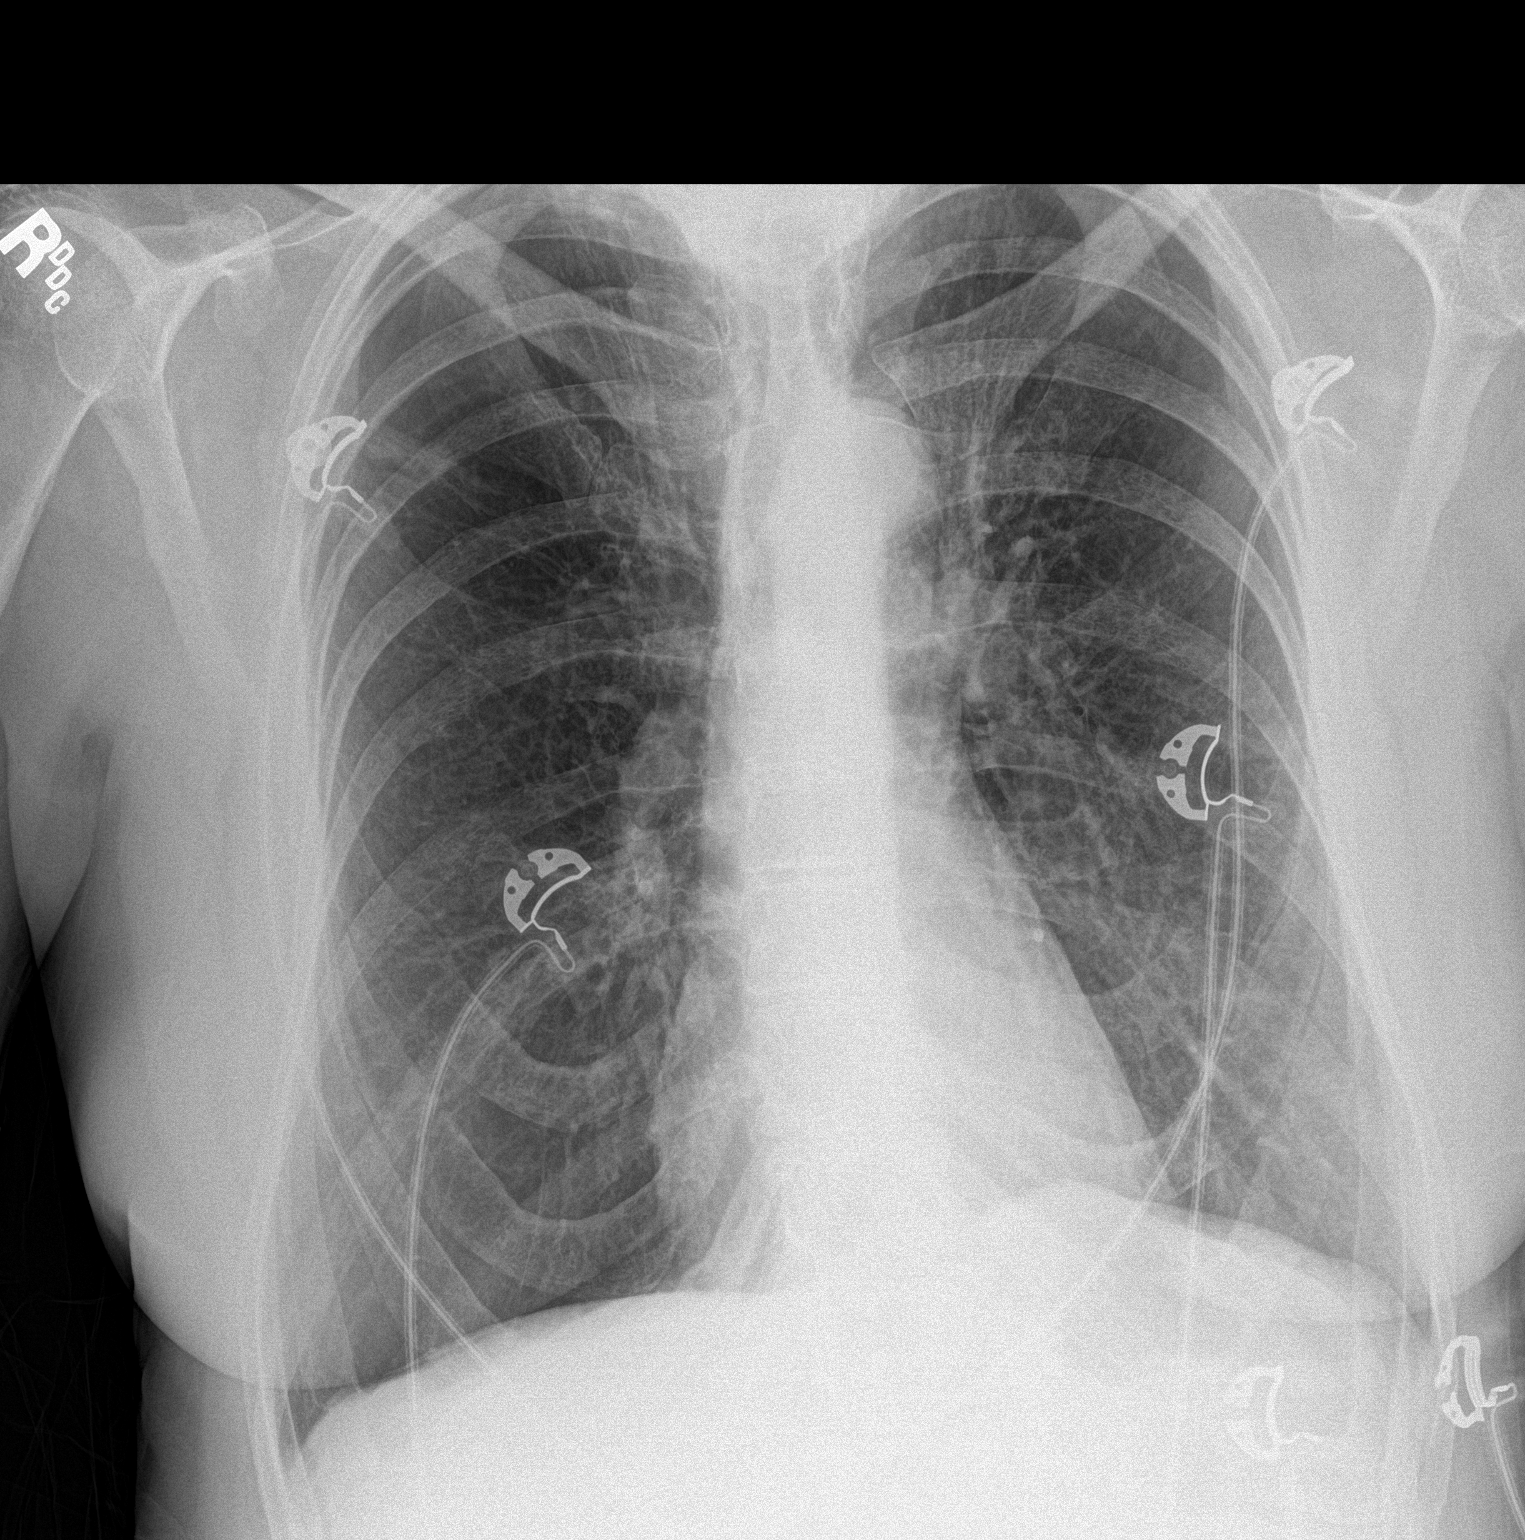

[1 of 1 positions shown; findings below may reference images not displayed]

FINDINGS: Cardiac shadow is within normal limits. The lungs are hyperinflated
consistent with COPD. Mild apical scarring is noted bilaterally. No
focal infiltrate or sizable effusion is seen. No bony abnormality is
noted
IMPRESSION: COPD without acute abnormality.

## 2016-04-07 ENCOUNTER — Encounter: Payer: Medicare Other | Admitting: *Deleted

## 2016-04-07 DIAGNOSIS — J449 Chronic obstructive pulmonary disease, unspecified: Secondary | ICD-10-CM | POA: Diagnosis not present

## 2016-04-07 NOTE — Progress Notes (Signed)
Daily Session Note  Patient Details  Name: MOET MIKULSKI MRN: [id number] Date of Birth: [birth year] Referring Provider:    Encounter Date: 04/07/2016  Check In:     Session Check In - 04/07/16 1247    Check-In   Location [facility]   Staff Present Gerlene Burdock, RN, BSN;Jessica Luan Pulling, MA, ACSM RCEP, Exercise Physiologist;Amanda Oletta Darter, BA, ACSM CEP, Exercise Physiologist   Supervising physician immediately available to respond to emergencies [facility] immediately available ER MD   Physician(s) Dr. Burlene Arnt and Dr. Kerman Passey   Medication changes reported     No   Fall or balance concerns reported    No   Warm-up and Cool-down Performed on first and last piece of equipment   Resistance Training Performed Yes   Pain Assessment   Currently in Pain? No/denies         Goals Met:  Proper associated with RPD/PD & O2 Sat Exercise tolerated well  Goals Unmet:  Not Applicable  Comments:     Dr. Emily Filbert is Medical Director for [place] and [facility].

## 2016-04-10 ENCOUNTER — Encounter: Payer: Self-pay | Admitting: *Deleted

## 2016-04-10 DIAGNOSIS — J449 Chronic obstructive pulmonary disease, unspecified: Secondary | ICD-10-CM

## 2016-04-10 NOTE — Progress Notes (Signed)
Pulmonary Individual Treatment Plan  Patient Details  Name: MAKAYLIN CARLO MRN: [id number] Date of Birth: [birth year] Referring Provider:        Documentation from 04/10/2016 in [facility] Cardiac and Pulmonary Rehab   Referring Provider  Dr. Simonne Maffucci      Initial Encounter Date:       Documentation from 04/10/2016 in [facility] Cardiac and Pulmonary Rehab   Referring Provider  Dr. Simonne Maffucci      Visit Diagnosis: COPD, moderate ([place])  Patient's Home Medications on Admission:  Current outpatient prescriptions:  .  albuterol (PROVENTIL) (2.5 MG/3ML) 0.083% nebulizer solution, USE ONE VIAL IN NEBULIZER EVERY 6 HOURS AS NEEDED FOR WHEEZING OR SHORTNESS OF BREATH, Disp: 360 mL, Rfl: 5 .  ALPRAZolam (XANAX) 0.25 MG tablet, Take 0.25 mg by mouth at bedtime as needed for anxiety., Disp: , Rfl:  .  budesonide-formoterol (SYMBICORT) 160-4.5 MCG/ACT inhaler, Inhale 2 puffs into the lungs 2 (two) times daily., Disp: 1 Inhaler, Rfl: 6 .  escitalopram (LEXAPRO) 5 MG tablet, Take 20 mg by mouth daily. , Disp: , Rfl:  .  fluticasone (FLONASE) 50 MCG/ACT nasal spray, USE TWO SPRAY(S) IN EACH NOSTRIL ONCE DAILY, Disp: 16 g, Rfl: 5 .  ipratropium (ATROVENT) 0.02 % nebulizer solution, Take 2.5 mLs (0.5 mg total) by nebulization every 6 (six) hours as needed for wheezing or shortness of breath. Dx Code J43.2, Disp: 360 mL, Rfl: 3 .  ipratropium (ATROVENT) 0.02 % nebulizer solution, INHALE ONE VIAL EVERY 6 HOURS AS NEEDED FOR WHEEZING OR SHORTNESS OF BREATH, Disp: 360 mL, Rfl: 5 .  Ipratropium-Albuterol (COMBIVENT RESPIMAT) 20-100 MCG/ACT AERS respimat, Inhale 1 puff into the lungs every 6 (six) hours as needed for wheezing., Disp: 1 Inhaler, Rfl: 5 .  pantoprazole (PROTONIX) 40 MG tablet, Take 40 mg by mouth 2 (two) times daily. Take 30-60 min before first meal of the day, Disp: , Rfl:   Past Medical History: Past Medical History  Diagnosis Date  . COPD (chronic obstructive pulmonary disease) ([place])   .  NSTEMI (non-ST elevated myocardial infarction) ([place])     12/2011 with normal coronaries possibly secondary to Takotsubo (EF 30-35% by echo), occurred following two episodes of acute respiratory distress  . Hypotension     limiting med titration  . 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 GERD (gastroesophageal reflux disease)     Tobacco Use: History  Smoking status  . Former Smoker -- 1.00 packs/day for 30 years  . Types: Cigarettes  . Quit date: 01/21/2012  Smokeless tobacco  . Never Used    Labs: Recent Review Flowsheet Data    Labs for ITP Cardiac and Pulmonary Rehab Latest Ref Rng 01/23/2012 01/24/2012 02/01/2012 08/08/2012 08/08/2012   Cholestrol 0 - 200 mg/dL - 134 - - -   LDLCALC 0 - 99 mg/dL - 41 - - -   HDL >39 mg/dL - 80 - - -   Trlycerides <150 mg/dL - 65 - - -   Hemoglobin A1c <5.7 % 5.6 - - - -   PHART 7.350 - 7.450 7.391 - 7.409(H) 7.206(L) 7.332(L)   PCO2ART 35.0 - 45.0 mmHg 38.4 - 39.4 46.5(H) 47.3(H)   HCO3 20.0 - 24.0 mEq/L 23.3 - 24.4(H) 18.4(L) 25.1(H)   TCO2 0 - 100 mmol/L 24 - 21._0 ACIDBASEDEF 0.0 - 2.0 mmol/L 1.0 - - 9.0(H) 1.0   O2SAT - 98.0 - 94.1 100.0 99.0       ADL UCSD:     Pulmonary  Assessment Scores      02/15/16 1330       ADL UCSD   ADL Phase Entry     SOB Score total 55     Rest 0     Walk 1     Stairs 3     Bath 1     Dress 1     Shop 2        Pulmonary Function Assessment:     Pulmonary Function Assessment - 02/15/16 1420    Pulmonary Function Tests   RV% 178 %   DLCO% 48 %   Initial Spirometry Results   FVC% 45 %   FEV1% 27 %   FEV1/FVC Ratio 45   Post Bronchodilator Spirometry Results   FVC% 63 %   FEV1% 32 %   FEV1/FVC Ratio 38   Breath   Bilateral Breath Sounds Clear;Decreased   Shortness of Breath Yes;Fear of Shortness of Breath;Limiting activity      Exercise Target Goals:    Exercise Program Goal: Individual exercise prescription set with THRR, safety & activity barriers. Participant demonstrates ability  to understand and report RPE using BORG scale, to self-measure pulse accurately, and to acknowledge the importance of the exercise prescription.  Exercise Prescription Goal: Starting with aerobic activity 30 plus minutes a day, 3 days per week for initial exercise prescription. Provide home exercise prescription and guidelines that participant acknowledges understanding prior to discharge.  Activity Barriers & Risk Stratification:     Activity Barriers & Cardiac Risk Stratification - 02/15/16 1330    Activity Barriers & Cardiac Risk Stratification   Activity Barriers Shortness of Breath;Deconditioning   Cardiac Risk Stratification Moderate      6 Minute Walk:     6 Minute Walk      02/15/16 1435 02/15/16 1447     6 Minute Walk   Phase Initial     Distance 1070 feet     Walk Time 6 minutes     MPH 2     RPE 12     Resting HR 88 bpm     Resting BP 118/68 mmHg     Max Ex. HR 91 bpm     Max Ex. BP 122/72 mmHg     Interval Oxygen   Interval Oxygen?  Yes    Baseline Oxygen Saturation %  95 %    6 Minute Oxygen Saturation %  95 %    2 Minute Post Oxygen Saturation %  95 %       Initial Exercise Prescription:     Initial Exercise Prescription - 04/10/16 1400    Date of Initial Exercise RX and Referring Provider   Referring Provider Dr. Simonne Maffucci      Perform Capillary Blood Glucose checks as needed.  Exercise Prescription Changes:     Exercise Prescription Changes      02/22/16 0700 02/28/16 1400 03/08/16 1200 04/03/16 1300     Exercise Review   Progression  Yes Yes Yes    Response to Exercise   Blood Pressure (Admit) 130/80 mmHg 118/78 mmHg 118/78 mmHg 122/62 mmHg    Blood Pressure (Exercise) 138/78 mmHg 148/70 mmHg 148/70 mmHg 138/84 mmHg    Blood Pressure (Exit) 114/64 mmHg 124/66 mmHg 124/66 mmHg 112/60 mmHg    Heart Rate (Admit) 99 bpm 78 bpm 78 bpm 80 bpm    Heart Rate (Exercise) 98 bpm 114 bpm 114 bpm 114 bpm    Heart Rate (Exit) 74 bpm 87 bpm 87  bpm 85 bpm    Oxygen Saturation (Admit) 94 % 95 % 95 % 98 %    Oxygen Saturation (Exercise) 92 % 96 % 96 % 95 %    Oxygen Saturation (Exit) 97 % 95 % 95 % 98 %    Rating of Perceived Exertion (Exercise) _0 Perceived Dyspnea (Exercise) 3 2.5 2.5 3    Comments data entry for 02/21/2016 - Ms Barreira's first day of exercise in [place].        Duration  Progress to 50 minutes of aerobic without signs/symptoms of physical distress Progress to 50 minutes of aerobic without signs/symptoms of physical distress Progress to 50 minutes of aerobic without signs/symptoms of physical distress    Intensity  Rest + 30 Rest + 30 THRR New    Progression   Progression    --  115-146    Resistance Training   Training Prescription Yes Yes Yes Yes    Weight _1 Reps 10-12 10-12 10-12 10-12    Treadmill   MPH 1.5 1.5 1.5 2    Grade 0 0 0 0    Minutes _2 10/5    NuStep   Level _3 Watts 40 40 40 60    Minutes _4 REL-XR   Level   3 3    Watts   60 60    Biostep-RELP   Level _5 Watts _6 Minutes _7 Exercise Comments:     Exercise Comments      03/22/16 1217 04/03/16 1535         Exercise Comments Pt plans to make more progress in program to work to 15 min continuous on all equipment before adding in 2nd day of home exercise. Ms Gover will start the 1:15 FF class when she has completed 18 sessions of [facility].         Discharge Exercise Prescription (Final Exercise Prescription Changes):     Exercise Prescription Changes - 04/03/16 1300    Exercise Review   Progression Yes   Response to Exercise   Blood Pressure (Admit) 122/62 mmHg   Blood Pressure (Exercise) 138/84 mmHg   Blood Pressure (Exit) 112/60 mmHg   Heart Rate (Admit) 80 bpm   Heart Rate (Exercise) 114 bpm   Heart Rate (Exit) 85 bpm   Oxygen Saturation (Admit) 98 %   Oxygen Saturation (Exercise) 95 %   Oxygen Saturation (Exit) 98 %   Rating  of Perceived Exertion (Exercise) 13   Perceived Dyspnea (Exercise) 3   Duration Progress to 50 minutes of aerobic without signs/symptoms of physical distress   Intensity THRR New   Progression   Progression --  115-146   Resistance Training   Training Prescription Yes   Weight 3   Reps 10-12   Treadmill   MPH 2   Grade 0   Minutes 15  10/5   NuStep   Level 3   Watts 60   Minutes 15   REL-XR   Level 3   Watts 60   Biostep-RELP   Level 2   Watts 20   Minutes 15       Nutrition:  Target Goals: Understanding of nutrition guidelines, daily intake of sodium <1528m, cholesterol <2037m calories 30%  from fat and 7% or less from saturated fats, daily to have 5 or more servings of fruits and vegetables.  Biometrics:     Pre Biometrics - 02/15/16 1438    Pre Biometrics   Height 5' 5.5" (1.664 m)   Weight 122 lb 6.4 oz (55.52 kg)   Waist Circumference 37 inches   Hip Circumference 35 inches   Waist to Hip Ratio 1.06 %   BMI (Calculated) 20.1       Nutrition Therapy Plan and Nutrition Goals:     Nutrition Therapy & Goals - 02/15/16 1330    Nutrition Therapy   Diet Ms Adamec would like to meet with the dietitian; large portion sizes, but low salt and sweets; she does like fruits and vegetables; she has cut her soda to 2 cans a day.      Nutrition Discharge: Rate Your Plate Scores:   Psychosocial: Target Goals: Acknowledge presence or absence of depression, maximize coping skills, provide positive support system. Participant is able to verbalize types and ability to use techniques and skills needed for reducing stress and depression.  Initial Review & Psychosocial Screening:     Initial Psych Review & Screening - 02/15/16 1330    Family Dynamics   Good Support System? Yes   Comments Ms Babich has good support from her boyfriend and daughter. Her hospital admission for COPD exacerbation in March was quite serious and is a concern for her to manage her COPD.    Barriers   Psychosocial barriers to participate in program The patient should benefit from training in stress management and relaxation.   Screening Interventions   Interventions Encouraged to exercise;Program counselor consult      Quality of Life Scores:     Quality of Life - 02/15/16 1435    Quality of Life Scores   Health/Function Pre 18.5 %   Socioeconomic Pre 23.29 %   Psych/Spiritual Pre 22.86 %   Family Pre 28.8 %   GLOBAL Pre 21.8 %      PHQ-9:     Recent Review Flowsheet Data    Depression screen [facility] 2/9 02/15/2016   Decreased Interest 0   Down, Depressed, Hopeless 0   PHQ - 2 Score 0   Altered sleeping 1   Tired, decreased energy 1   Change in appetite 1   Feeling bad or failure about yourself  0   Trouble concentrating 0   Moving slowly or fidgety/restless 0   Suicidal thoughts 0   PHQ-9 Score 3   Difficult doing work/chores Not difficult at all      Psychosocial Evaluation and Intervention:     Psychosocial Evaluation - 02/21/16 1238    Psychosocial Evaluation & Interventions   Interventions Encouraged to exercise with the program and follow exercise prescription   Comments Counselor met with Ms. Kinchen today for initial psychosocial evaluation.  She is almost 60 years old and was diagnosed with COPD in 2013.  Ms. Caraveo has been in this program before, but had the flu one month ago and that has exacerbated her health condition.  She has a strong support system with a daughter and son who live close by and a significant other who is in the same home with her.  Ms. Deike states she sleeps well now with Oxygen now and takes a Xanax & Unisom PRN.  She does not have a great appetite.  She denies a history of depression but has struggled with Anxiety and has been on Lexapro  for 2 years to address this.  She reports no current symptoms of either at this time.  Ms. Urbani states she is typically in a positive mood, and other than her health she has minimal stress.   Her goals for this program are to breathe better and improve her health overall.  She plans to return to the [employer] at [facility] as a follow up upon completion of pulmonary rehab.     Continued Psychosocial Services Needed Yes  Ms. Doyle will benefit from the psychoeducational components of this program, especially stress management.        Psychosocial Re-Evaluation:     Psychosocial Re-Evaluation      03/15/16 1257           Psychosocial Re-Evaluation   Comments Counselor follow up with Ms. borgen today reporting she has been experiencing "tremors" more lately and had a negative reaction to the medication her PCP gave her.  She asked for information on local psychiatrists as she thinks she would benefit from a medication evaluation.  Counselor assessed her for depression and anxiety with ms. Viviano reporting her anxiety levels are high and although exercise helps decrease this, it is only temporary.  Counselor provided information on psychiatrists for her to consider to help with this.  Counselor will continue to follow with Ms. Lorenzi during this course of this program.           Education: Education Goals: Education classes will be provided on a weekly basis, covering required topics. Participant will state understanding/return demonstration of topics presented.  Learning Barriers/Preferences:     Learning Barriers/Preferences - 02/15/16 1330    Learning Barriers/Preferences   Learning Barriers None   Learning Preferences Group Instruction;Individual Instruction;Pictoral;Skilled Demonstration;Verbal Instruction;Video;Written Material      Education Topics: Initial Evaluation Education: - Verbal, written and demonstration of respiratory meds, RPE/PD scales, oximetry and breathing techniques. Instruction on use of nebulizers and MDIs: cleaning and proper use, rinsing mouth with steroid doses and importance of monitoring MDI activations.          Pulmonary Rehab from  02/25/2016 in [facility] Cardiac and Pulmonary Rehab   Date  02/15/16   Educator  LB   Instruction Review Code  2- meets goals/outcomes      General Nutrition Guidelines/Fats and Fiber: -Group instruction provided by verbal, written material, models and posters to present the general guidelines for heart healthy nutrition. Gives an explanation and review of dietary fats and fiber.      Pulmonary Rehab from 04/03/2016 in [facility] Cardiac and Pulmonary Rehab   Date  04/03/16   Educator  CR   Instruction Review Code  2- meets goals/outcomes      Controlling Sodium/Reading Food Labels: -Group verbal and written material supporting the discussion of sodium use in heart healthy nutrition. Review and explanation with models, verbal and written materials for utilization of the food label.   Exercise Physiology & Risk Factors: - Group verbal and written instruction with models to review the exercise physiology of the cardiovascular system and associated critical values. Details cardiovascular disease risk factors and the goals associated with each risk factor.   Aerobic Exercise & Resistance Training: - Gives group verbal and written discussion on the health impact of inactivity. On the components of aerobic and resistive training programs and the benefits of this training and how to safely progress through these programs.   Flexibility, Balance, General Exercise Guidelines: - Provides group verbal and written instruction on the benefits of  flexibility and balance training programs. Provides general exercise guidelines with specific guidelines to those with heart or lung disease. Demonstration and skill practice provided.      Pulmonary Rehab from 04/03/2016 in [facility] Cardiac and Pulmonary Rehab   Date  03/08/16   Educator  Salley Hews, PT   Instruction Review Code  2- meets goals/outcomes      Stress Management: - Provides group verbal and written instruction about the health risks of elevated stress,  cause of high stress, and healthy ways to reduce stress.      Pulmonary Rehab from 04/03/2016 in [facility] Cardiac and Pulmonary Rehab   Date  03/15/16   Educator  [facility]   Instruction Review Code  2- meets goals/outcomes      Depression: - Provides group verbal and written instruction on the correlation between heart/lung disease and depressed mood, treatment options, and the stigmas associated with seeking treatment.   Exercise & Equipment Safety: - Individual verbal instruction and demonstration of equipment use and safety with use of the equipment.      Pulmonary Rehab from 04/03/2016 in [facility] Cardiac and Pulmonary Rehab   Date  02/28/16   Educator  C. EnterkinRN   Instruction Review Code  2- meets goals/outcomes      Infection Prevention: - Provides verbal and written material to individual with discussion of infection control including proper hand washing and proper equipment cleaning during exercise session.   Falls Prevention: - Provides verbal and written material to individual with discussion of falls prevention and safety.      Pulmonary Rehab from 02/25/2016 in [facility] Cardiac and Pulmonary Rehab   Date  02/15/16   Educator  LB   Instruction Review Code  2- meets goals/outcomes      Diabetes: - Individual verbal and written instruction to review signs/symptoms of diabetes, desired ranges of glucose level fasting, after meals and with exercise. Advice that pre and post exercise glucose checks will be done for 3 sessions at entry of program.   Chronic Lung Diseases: - Group verbal and written instruction to review new updates, new respiratory medications, new advancements in procedures and treatments. Provide informative websites and "800" numbers of self-education.      Pulmonary Rehab from 04/03/2016 in [facility] Cardiac and Pulmonary Rehab   Date  03/06/16   Educator  L. Brown,RT   Instruction Review Code  2- meets goals/outcomes      Lung Procedures: - Group verbal and written  instruction to describe testing methods done to diagnose lung disease. Review the outcome of test results. Describe the treatment choices: Pulmonary Function Tests, ABGs and oximetry.   Energy Conservation: - Provide group verbal and written instruction for methods to conserve energy, plan and organize activities. Instruct on pacing techniques, use of adaptive equipment and posture/positioning to relieve shortness of breath.   Triggers: - Group verbal and written instruction to review types of environmental controls: home humidity, furnaces, filters, dust mite/pet prevention, HEPA vacuums. To discuss weather changes, air quality and the benefits of nasal washing.      Pulmonary Rehab from 02/25/2016 in [facility] Cardiac and Pulmonary Rehab   Date  02/21/16   Educator  LB   Instruction Review Code  2- meets goals/outcomes      Exacerbations: - Group verbal and written instruction to provide: warning signs, infection symptoms, calling MD promptly, preventive modes, and value of vaccinations. Review: effective airway clearance, coughing and/or vibration techniques. Create an Sports administrator.   Oxygen: - Individual and  group verbal and written instruction on oxygen therapy. Includes supplement oxygen, available portable oxygen systems, continuous and intermittent flow rates, oxygen safety, concentrators, and Medicare reimbursement for oxygen.   Respiratory Medications: - Group verbal and written instruction to review medications for lung disease. Drug class, frequency, complications, importance of spacers, rinsing mouth after steroid MDI's, and proper cleaning methods for nebulizers.      Pulmonary Rehab from 02/25/2016 in [facility] Cardiac and Pulmonary Rehab   Date  02/15/16   Educator  LB   Instruction Review Code  2- meets goals/outcomes      AED/CPR: - Group verbal and written instruction with the use of models to demonstrate the basic use of the AED with the basic ABC's of  resuscitation.   Breathing Retraining: - Provides individuals verbal and written instruction on purpose, frequency, and proper technique of diaphragmatic breathing and pursed-lipped breathing. Applies individual practice skills.      Pulmonary Rehab from 02/25/2016 in [facility] Cardiac and Pulmonary Rehab   Date  02/15/16   Educator  LB   Instruction Review Code  2- meets goals/outcomes      Anatomy and Physiology of the Lungs: - Group verbal and written instruction with the use of models to provide basic lung anatomy and physiology related to function, structure and complications of lung disease.   Heart Failure: - Group verbal and written instruction on the basics of heart failure: signs/symptoms, treatments, explanation of ejection fraction, enlarged heart and cardiomyopathy.      Pulmonary Rehab from 02/25/2016 in [facility] Cardiac and Pulmonary Rehab   Date  02/25/16   Educator  [place]   Instruction Review Code  2- meets goals/outcomes      Sleep Apnea: - Individual verbal and written instruction to review Obstructive Sleep Apnea. Review of risk factors, methods for diagnosing and types of masks and machines for OSA.   Anxiety: - Provides group, verbal and written instruction on the correlation between heart/lung disease and anxiety, treatment options, and management of anxiety.   Relaxation: - Provides group, verbal and written instruction about the benefits of relaxation for patients with heart/lung disease. Also provides patients with examples of relaxation techniques.      Pulmonary Rehab from 04/03/2016 in [facility] Cardiac and Pulmonary Rehab   Date  03/29/16   Educator  [facility]   Instruction Review Code  2- Meets goals/outcomes      Knowledge Questionnaire Score:     Knowledge Questionnaire Score - 02/15/16 1330    Knowledge Questionnaire Score   Pre Score 10/10       Core Components/Risk Factors/Patient Goals at Admission:     Personal Goals and Risk Factors at Admission -  02/15/16 1330    Core Components/Risk Factors/Patient Goals on Admission    Weight Management Yes;Weight Loss   Intervention Weight Management: Develop a combined nutrition and exercise program designed to reach desired caloric intake, while maintaining appropriate intake of nutrient and fiber, sodium and fats, and appropriate energy expenditure required for the weight goal.;Weight Management: Provide education and appropriate resources to help participant work on and attain dietary goals.;Weight Management/Obesity: Establish reasonable short term and long term weight goals.   Admit Weight 125 lb (56.7 kg)   Goal Weight: Short Term 120 lb (54.432 kg)   Goal Weight: Long Term 115 lb (52.164 kg)   Expected Outcomes Short Term: Continue to assess and modify interventions until short term weight is achieved;Long Term: Adherence to nutrition and physical activity/exercise program aimed toward attainment of established  weight goal;Weight Maintenance: Understanding of the daily nutrition guidelines, which includes 25-35% calories from fat, 7% or less cal from saturated fats, less than 226m cholesterol, less than 1.5gm of sodium, & 5 or more servings of fruits and vegetables daily;Weight Loss: Understanding of general recommendations for a balanced deficit meal plan, which promotes 1-2 lb weight loss per week and includes a negative energy balance of [phone] kcal/d;Understanding recommendations for meals to include 15-35% energy as protein, 25-35% energy from fat, 35-60% energy from carbohydrates, less than 2083mof dietary cholesterol, 20-35 gm of total fiber daily;Understanding of distribution of calorie intake throughout the day with the consumption of 4-5 meals/snacks   Sedentary Yes   Intervention Provide advice, education, support and counseling about physical activity/exercise needs.;Develop an individualized exercise prescription for aerobic and resistive training based on initial evaluation findings,  risk stratification, comorbidities and participant's personal goals.   Expected Outcomes Achievement of increased cardiorespiratory fitness and enhanced flexibility, muscular endurance and strength shown through measurements of functional capacity and personal statement of participant.   Increase Strength and Stamina Yes   Intervention Provide advice, education, support and counseling about physical activity/exercise needs.;Develop an individualized exercise prescription for aerobic and resistive training based on initial evaluation findings, risk stratification, comorbidities and participant's personal goals.   Expected Outcomes Achievement of increased cardiorespiratory fitness and enhanced flexibility, muscular endurance and strength shown through measurements of functional capacity and personal statement of participant.   Improve shortness of breath with ADL's Yes   Intervention Provide education, individualized exercise plan and daily activity instruction to help decrease symptoms of SOB with activities of daily living.   Expected Outcomes Short Term: Achieves a reduction of symptoms when performing activities of daily living.   Develop more efficient breathing techniques such as purse lipped breathing and diaphragmatic breathing; and practicing self-pacing with activity Yes   Intervention Provide education, demonstration and support about specific breathing techniuqes utilized for more efficient breathing. Include techniques such as pursed lipped breathing, diaphragmatic breathing and self-pacing activity.   Expected Outcomes Short Term: Participant will be able to demonstrate and use breathing techniques as needed throughout daily activities.   Increase knowledge of respiratory medications and ability to use respiratory devices properly  Yes  MDI's Symbicort, Ventolin, Combivent; SVN Albuterol and Atrovent   Intervention Provide education and demonstration as needed of appropriate use of  medications, inhalers, and oxygen therapy.   Expected Outcomes Short Term: Achieves understanding of medications use. Understands that oxygen is a medication prescribed by physician. Demonstrates appropriate use of inhaler and oxygen therapy.      Core Components/Risk Factors/Patient Goals Review:      Goals and Risk Factor Review      02/22/16 0707 03/06/16 1130 03/22/16 1214 04/03/16 1130     Core Components/Risk Factors/Patient Goals Review   Personal Goals Review Develop more efficient breathing techniques such as purse lipped breathing and diaphragmatic breathing and practicing self-pacing with activity. Increase Strength and Stamina;Improve shortness of breath with ADL's;Increase knowledge of respiratory medications and ability to use respiratory devices properly. Develop more efficient breathing techniques such as purse lipped breathing and diaphragmatic breathing and practicing self-pacing with activity.;Improve shortness of breath with ADL's;Sedentary Increase knowledge of respiratory medications and ability to use respiratory devices properly.    Review Ms [place] had been in the program before and adjusted to the exercise goals and equipment set-up. Using PLB is part of her daily management of her shortness of breath  with activies. She has good technique and needs no queing with her exerciae goals in [facility].  Ms Scaff has noticed an improvement in her shortness of breath since starting [facility]. She knows her limits and knows when to stop and rest. Her stamina and energy level has increased since she was so sick with the flu.  We discussed her MDI's and  is using her spacer . When she goes out for the day, Ms Depaula  will take a SVN treatment  for her breathing. Pt is exercising 1 additional day a week at home using stationary bike and weights. She has been using pursed lip breathing to improve her ADLs.  High heat days are still making in hard  for her to breathe in general.  She is only needing to use her O2 at night.  She has noticed the biggest difference in her breathing on the treadmill when using pursed lip breathing.. Reviewed Ms Dimmick's MDI's - she uses her Combivent for rescue and has the Ventolin MDI as back-up. She is using her SVN everyday at least once a day and uses her Symibort as prescribed with a spacer.    Expected Outcomes Continue using PLB daily for activity at home and with exercise goals in [facility].  Plan to continue to see improvements and will eventually add in 2nd day at home for exercise. Continue to use MDI's and SVN as ordered and use spacer for the Combivent and Symbicort.       Core Components/Risk Factors/Patient Goals at Discharge (Final Review):      Goals and Risk Factor Review - 04/03/16 1130    Core Components/Risk Factors/Patient Goals Review   Personal Goals Review Increase knowledge of respiratory medications and ability to use respiratory devices properly.   Review Reviewed Ms Swartout's MDI's - she uses her Combivent for rescue and has the Ventolin MDI as back-up. She is using her SVN everyday at least once a day and uses her Symibort as prescribed with a spacer.   Expected Outcomes Continue to use MDI's and SVN as ordered and use spacer for the Combivent and Symbicort.      ITP Comments:   Comments: 30 Day Review

## 2016-04-12 ENCOUNTER — Encounter: Payer: Medicare Other | Admitting: *Deleted

## 2016-04-12 DIAGNOSIS — J449 Chronic obstructive pulmonary disease, unspecified: Secondary | ICD-10-CM

## 2016-04-12 NOTE — Progress Notes (Signed)
Daily Session Note  Patient Details  Name: Heidi [name] MRN: [id number] Date of Birth: 02/18/1956 Referring Provider:        Documentation from 04/10/2016 in [facility]   Referring Provider  Dr. Simonne Maffucci      Encounter Date: 04/12/2016  Check In:     Session Check In - 04/12/16 1151    Check-In   Location [facility]   Staff Present Carson Myrtle, BS, RRT, Respiratory Lennie Hummer, MA, ACSM RCEP, Exercise Physiologist;Diane Joya Gaskins, RN, BSN   Supervising physician immediately available to respond to emergencies [facility] immediately available ER MD   Physician(s) Archie Balboa and Reita Cliche   Medication changes reported     No   Fall or balance concerns reported    No   Warm-up and Cool-down Performed on first and last piece of equipment   Resistance Training Performed Yes   VAD Patient? No   Pain Assessment   Currently in Pain? No/denies         Goals Met:  Proper associated with RPD/PD & O2 Sat Exercise tolerated well Strength training completed today Home Exercise Guidelines reviewed  Goals Unmet:  Not Applicable  Comments:  Pt able to follow exercise prescription today without complaint.  Will continue to monitor for progression.  See ITP Exercise Comments for home exercise review.  Dr. Emily Filbert is Medical Director for [facility] and [facility].

## 2016-04-24 ENCOUNTER — Telehealth: Payer: Self-pay | Admitting: Respiratory Therapy

## 2016-04-24 NOTE — Telephone Encounter (Signed)
Ms Heidi [name] called and will be out all week due to a death in the family.

## 2016-05-01 ENCOUNTER — Encounter: Payer: Medicare Other | Attending: Pulmonary Disease

## 2016-05-01 DIAGNOSIS — J449 Chronic obstructive pulmonary disease, unspecified: Secondary | ICD-10-CM | POA: Insufficient documentation

## 2016-05-05 ENCOUNTER — Telehealth: Payer: Self-pay | Admitting: *Deleted

## 2016-05-05 NOTE — Telephone Encounter (Signed)
Left voicemail for patient notifying them that it is time to schedule annual low dose lung cancer screening CT scan. Instructed patient to call back to verify information prior to the scan being scheduled.

## 2016-05-08 ENCOUNTER — Encounter: Payer: Self-pay | Admitting: *Deleted

## 2016-05-08 ENCOUNTER — Encounter: Payer: Self-pay | Admitting: Respiratory Therapy

## 2016-05-08 DIAGNOSIS — J449 Chronic obstructive pulmonary disease, unspecified: Secondary | ICD-10-CM

## 2016-05-08 NOTE — Progress Notes (Signed)
Pulmonary Individual Treatment Plan  Patient Details  Name: Heidi [name] MRN: [id number] Date of Birth: [birth year] Referring Provider:        Documentation from 04/10/2016 in [facility] Cardiac and Pulmonary Rehab   Referring Provider  Dr. Simonne Maffucci      Initial Encounter Date:       Documentation from 04/10/2016 in [facility] Cardiac and Pulmonary Rehab   Referring Provider  Dr. Simonne Maffucci      Visit Diagnosis: COPD, moderate ([place])  Patient's Home Medications on Admission:  Current outpatient prescriptions:  .  albuterol (PROVENTIL) (2.5 MG/3ML) 0.083% nebulizer solution, USE ONE VIAL IN NEBULIZER EVERY 6 HOURS AS NEEDED FOR WHEEZING OR SHORTNESS OF BREATH, Disp: 360 mL, Rfl: 5 .  ALPRAZolam (XANAX) 0.25 MG tablet, Take 0.25 mg by mouth at bedtime as needed for anxiety., Disp: , Rfl:  .  budesonide-formoterol (SYMBICORT) 160-4.5 MCG/ACT inhaler, Inhale 2 puffs into the lungs 2 (two) times daily., Disp: 1 Inhaler, Rfl: 6 .  escitalopram (LEXAPRO) 5 MG tablet, Take 20 mg by mouth daily. , Disp: , Rfl:  .  fluticasone (FLONASE) 50 MCG/ACT nasal spray, USE TWO SPRAY(S) IN EACH NOSTRIL ONCE DAILY, Disp: 16 g, Rfl: 5 .  ipratropium (ATROVENT) 0.02 % nebulizer solution, Take 2.5 mLs (0.5 mg total) by nebulization every 6 (six) hours as needed for wheezing or shortness of breath. Dx Code J43.2, Disp: 360 mL, Rfl: 3 .  ipratropium (ATROVENT) 0.02 % nebulizer solution, INHALE ONE VIAL EVERY 6 HOURS AS NEEDED FOR WHEEZING OR SHORTNESS OF BREATH, Disp: 360 mL, Rfl: 5 .  Ipratropium-Albuterol (COMBIVENT RESPIMAT) 20-100 MCG/ACT AERS respimat, Inhale 1 puff into the lungs every 6 (six) hours as needed for wheezing., Disp: 1 Inhaler, Rfl: 5 .  pantoprazole (PROTONIX) 40 MG tablet, Take 40 mg by mouth 2 (two) times daily. Take 30-60 min before first meal of the day, Disp: , Rfl:   Past Medical History: Past Medical History  Diagnosis Date  . COPD (chronic obstructive pulmonary disease) ([place])   .  NSTEMI (non-ST elevated myocardial infarction) ([place])     12/2011 with normal coronaries possibly secondary to Takotsubo (EF 30-35% by echo), occurred following two episodes of acute respiratory distress  . Hypotension     limiting med titration  . 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 GERD (gastroesophageal reflux disease)     Tobacco Use: History  Smoking status  . Former Smoker -- 1.00 packs/day for 30 years  . Types: Cigarettes  . Quit date: 01/21/2012  Smokeless tobacco  . Never Used    Labs: Recent Review Flowsheet Data    Labs for ITP Cardiac and Pulmonary Rehab Latest Ref Rng 01/23/2012 01/24/2012 02/01/2012 08/08/2012 08/08/2012   Cholestrol 0 - 200 mg/dL - 134 - - -   LDLCALC 0 - 99 mg/dL - 41 - - -   HDL >39 mg/dL - 80 - - -   Trlycerides <150 mg/dL - 65 - - -   Hemoglobin A1c <5.7 % 5.6 - - - -   PHART 7.350 - 7.450 7.391 - 7.409(H) 7.206(L) 7.332(L)   PCO2ART 35.0 - 45.0 mmHg 38.4 - 39.4 46.5(H) 47.3(H)   HCO3 20.0 - 24.0 mEq/L 23.3 - 24.4(H) 18.4(L) 25.1(H)   TCO2 0 - 100 mmol/L 24 - 21._0 ACIDBASEDEF 0.0 - 2.0 mmol/L 1.0 - - 9.0(H) 1.0   O2SAT - 98.0 - 94.1 100.0 99.0       ADL UCSD:     Pulmonary  Assessment Scores      02/15/16 1330       ADL UCSD   ADL Phase Entry     SOB Score total 55     Rest 0     Walk 1     Stairs 3     Bath 1     Dress 1     Shop 2        Pulmonary Function Assessment:     Pulmonary Function Assessment - 02/15/16 1420    Pulmonary Function Tests   RV% 178 %   DLCO% 48 %   Initial Spirometry Results   FVC% 45 %   FEV1% 27 %   FEV1/FVC Ratio 45   Post Bronchodilator Spirometry Results   FVC% 63 %   FEV1% 32 %   FEV1/FVC Ratio 38   Breath   Bilateral Breath Sounds Clear;Decreased   Shortness of Breath Yes;Fear of Shortness of Breath;Limiting activity      Exercise Target Goals:    Exercise Program Goal: Individual exercise prescription set with THRR, safety & activity barriers. Participant demonstrates ability  to understand and report RPE using BORG scale, to self-measure pulse accurately, and to acknowledge the importance of the exercise prescription.  Exercise Prescription Goal: Starting with aerobic activity 30 plus minutes a day, 3 days per week for initial exercise prescription. Provide home exercise prescription and guidelines that participant acknowledges understanding prior to discharge.  Activity Barriers & Risk Stratification:     Activity Barriers & Cardiac Risk Stratification - 02/15/16 1330    Activity Barriers & Cardiac Risk Stratification   Activity Barriers Shortness of Breath;Deconditioning   Cardiac Risk Stratification Moderate      6 Minute Walk:     6 Minute Walk      02/15/16 1435 02/15/16 1447     6 Minute Walk   Phase Initial     Distance 1070 feet     Walk Time 6 minutes     MPH 2     RPE 12     Resting HR 88 bpm     Resting BP 118/68 mmHg     Max Ex. HR 91 bpm     Max Ex. BP 122/72 mmHg     Interval Oxygen   Interval Oxygen?  Yes    Baseline Oxygen Saturation %  95 %    6 Minute Oxygen Saturation %  95 %    2 Minute Post Oxygen Saturation %  95 %       Initial Exercise Prescription:     Initial Exercise Prescription - 04/10/16 1400    Date of Initial Exercise RX and Referring Provider   Referring Provider Dr. Simonne Maffucci      Perform Capillary Blood Glucose checks as needed.  Exercise Prescription Changes:     Exercise Prescription Changes      02/22/16 0700 02/28/16 1400 03/08/16 1200 04/03/16 1300 04/12/16 1200   Exercise Review   Progression  Yes Yes Yes Yes   Response to Exercise   Blood Pressure (Admit) 130/80 mmHg 118/78 mmHg 118/78 mmHg 122/62 mmHg    Blood Pressure (Exercise) 138/78 mmHg 148/70 mmHg 148/70 mmHg 138/84 mmHg    Blood Pressure (Exit) 114/64 mmHg 124/66 mmHg 124/66 mmHg 112/60 mmHg    Heart Rate (Admit) 99 bpm 78 bpm 78 bpm 80 bpm    Heart Rate (Exercise) 98 bpm 114 bpm 114 bpm 114 bpm    Heart Rate (Exit) 74  bpm 87 bpm  87 bpm 85 bpm    Oxygen Saturation (Admit) 94 % 95 % 95 % 98 %    Oxygen Saturation (Exercise) 92 % 96 % 96 % 95 %    Oxygen Saturation (Exit) 97 % 95 % 95 % 98 %    Rating of Perceived Exertion (Exercise) _0 Perceived Dyspnea (Exercise) 3 2.5 2.5 3    Symptoms     none   Comments data entry for 02/21/2016 - Heidi [name]'s first day of exercise in [place].     Home Exercise Given 04/12/16   Duration  Progress to 50 minutes of aerobic without signs/symptoms of physical distress Progress to 50 minutes of aerobic without signs/symptoms of physical distress Progress to 50 minutes of aerobic without signs/symptoms of physical distress Progress to 50 minutes of aerobic without signs/symptoms of physical distress   Intensity  Rest + 30 Rest + 30 THRR New THRR New   Progression   Progression    --  115-146 --  115-146   Resistance Training   Training Prescription _1    Weight _2 Reps 10-12 10-12 10-12 10-12 10-12   Treadmill   MPH 1.5 1.5 1._3 Grade 0 0 0 0 0   Minutes _4 10/5 15  10/5   NuStep   Level _5 Watts 40 40 40 60 60   Minutes _6 REL-XR   Level   _7 Watts   60 60 60   Biostep-RELP   Level _8 Watts _9 Minutes _10 Home Exercise Plan   Plans to continue exercise at     Home  walking and rower   Frequency     Add 2 additional days to program exercise sessions.     04/18/16 1600           Exercise Review   Progression Yes       Response to Exercise   Blood Pressure (Admit) 110/60 mmHg       Blood Pressure (Exercise) 148/80 mmHg       Blood Pressure (Exit) 116/60 mmHg       Heart Rate (Admit) 96 bpm       Heart Rate (Exercise) 112 bpm       Heart Rate (Exit) 98 bpm       Oxygen Saturation (Admit) 95 %       Oxygen Saturation (Exercise) 91 %       Oxygen Saturation (Exit) 97 %       Rating of Perceived Exertion (Exercise) 13       Intensity  THRR unchanged       Resistance Training   Training Prescription Yes       Weight 3       Reps 10-12       Treadmill   MPH 2       Grade 0       Minutes 15       NuStep   Level 3       Watts 40       Minutes 15       REL-XR   Level 4       Watts 60  Exercise Comments:     Exercise Comments      03/22/16 1217 04/03/16 1535 04/12/16 1251 04/18/16 1606 05/03/16 1340   Exercise Comments Pt plans to make more progress in program to work to 15 min continuous on all equipment before adding in 2nd day of home exercise. Heidi [name] will start the 1:15 FF class when she has completed 18 sessions of [facility]. Reviewed home exercise with pt today.  Pt plans to walk and use stationary rower at home for exercise.  We discussed her progression of exercise.  Reviewed THR, pulse, RPE, sign and symptoms, and when to call 911 or MD.  Also discussed weather considerations and indoor options.  Pt voiced understanding. Heidi [name] is progessing well with exercise  Cynthias last visit was 04/12/16      Discharge Exercise Prescription (Final Exercise Prescription Changes):     Exercise Prescription Changes - 04/18/16 1600    Exercise Review   Progression Yes   Response to Exercise   Blood Pressure (Admit) 110/60 mmHg   Blood Pressure (Exercise) 148/80 mmHg   Blood Pressure (Exit) 116/60 mmHg   Heart Rate (Admit) 96 bpm   Heart Rate (Exercise) 112 bpm   Heart Rate (Exit) 98 bpm   Oxygen Saturation (Admit) 95 %   Oxygen Saturation (Exercise) 91 %   Oxygen Saturation (Exit) 97 %   Rating of Perceived Exertion (Exercise) 13   Intensity THRR unchanged   Resistance Training   Training Prescription Yes   Weight 3   Reps 10-12   Treadmill   MPH 2   Grade 0   Minutes 15   NuStep   Level 3   Watts 40   Minutes 15   REL-XR   Level 4   Watts 60       Nutrition:  Target Goals: Understanding of nutrition guidelines, daily intake of sodium <1557m, cholesterol <2021m calories 30% from  fat and 7% or less from saturated fats, daily to have 5 or more servings of fruits and vegetables.  Biometrics:     Pre Biometrics - 02/15/16 1438    Pre Biometrics   Height 5' 5.5" (1.664 m)   Weight 122 lb 6.4 oz (55.52 kg)   Waist Circumference 37 inches   Hip Circumference 35 inches   Waist to Hip Ratio 1.06 %   BMI (Calculated) 20.1       Nutrition Therapy Plan and Nutrition Goals:     Nutrition Therapy & Goals - 02/15/16 1330    Nutrition Therapy   Diet Heidi DoBojanowskiould like to meet with the dietitian; large portion sizes, but low salt and sweets; she does like fruits and vegetables; she has cut her soda to 2 cans a day.      Nutrition Discharge: Rate Your Plate Scores:   Psychosocial: Target Goals: Acknowledge presence or absence of depression, maximize coping skills, provide positive support system. Participant is able to verbalize types and ability to use techniques and skills needed for reducing stress and depression.  Initial Review & Psychosocial Screening:     Initial Psych Review & Screening - 02/15/16 1330    Family Dynamics   Good Support System? Yes   Comments Heidi [name] good support from her boyfriend and daughter. Her hospital admission for COPD exacerbation in March was quite serious and is a concern for her to manage her COPD.   Barriers   Psychosocial barriers to participate in program The patient should benefit from training in stress management  and relaxation.   Screening Interventions   Interventions Encouraged to exercise;Program counselor consult      Quality of Life Scores:     Quality of Life - 02/15/16 1435    Quality of Life Scores   Health/Function Pre 18.5 %   Socioeconomic Pre 23.29 %   Psych/Spiritual Pre 22.86 %   Family Pre 28.8 %   GLOBAL Pre 21.8 %      PHQ-9:     Recent Review Flowsheet Data    Depression screen [facility] 2/9 02/15/2016   Decreased Interest 0   Down, Depressed, Hopeless 0   PHQ - 2 Score 0   Altered  sleeping 1   Tired, decreased energy 1   Change in appetite 1   Feeling bad or failure about yourself  0   Trouble concentrating 0   Moving slowly or fidgety/restless 0   Suicidal thoughts 0   PHQ-9 Score 3   Difficult doing work/chores Not difficult at all      Psychosocial Evaluation and Intervention:     Psychosocial Evaluation - 02/21/16 1238    Psychosocial Evaluation & Interventions   Interventions Encouraged to exercise with the program and follow exercise prescription   Comments Counselor met with Heidi. [name] today for initial psychosocial evaluation.  She is almost 60 years old and was diagnosed with COPD in 2013.  Heidi. [name] has been in this program before, but had the flu one month ago and that has exacerbated her health condition.  She has a strong support system with a daughter and son who live close by and a significant other who is in the same home with her.  Heidi. [name] states she sleeps well now with Oxygen now and takes a Xanax & Unisom PRN.  She does not have a great appetite.  She denies a history of depression but has struggled with Anxiety and has been on Lexapro for 2 years to address this.  She reports no current symptoms of either at this time.  Heidi. [name] states she is typically in a positive mood, and other than her health she has minimal stress.  Her goals for this program are to breathe better and improve her health overall.  She plans to return to the [employer] at [facility] as a follow up upon completion of pulmonary rehab.     Continued Psychosocial Services Needed Yes  Heidi. [name] will benefit from the psychoeducational components of this program, especially stress management.        Psychosocial Re-Evaluation:     Psychosocial Re-Evaluation      03/15/16 1257 04/12/16 1232         Psychosocial Re-Evaluation   Comments Counselor follow up with Heidi. [name] today reporting she has been experiencing "tremors" more lately and had a negative reaction to  the medication her PCP gave her.  She asked for information on local psychiatrists as she thinks she would benefit from a medication evaluation.  Counselor assessed her for depression and anxiety with Heidi. [name] reporting her anxiety levels are high and although exercise helps decrease this, it is only temporary.  Counselor provided information on psychiatrists for her to consider to help with this.  Counselor will continue to follow with Heidi. [name] during this course of this program.   Counselor follow up with Heidi. [name] today stating her mood is stable and the tremors have d/c.  She states an increase in energy and strength since coming into this program and that she "  definitely feels better."        Education: Education Goals: Education classes will be provided on a weekly basis, covering required topics. Participant will state understanding/return demonstration of topics presented.  Learning Barriers/Preferences:     Learning Barriers/Preferences - 02/15/16 1330    Learning Barriers/Preferences   Learning Barriers None   Learning Preferences Group Instruction;Individual Instruction;Pictoral;Skilled Demonstration;Verbal Instruction;Video;Written Material      Education Topics: Initial Evaluation Education: - Verbal, written and demonstration of respiratory meds, RPE/PD scales, oximetry and breathing techniques. Instruction on use of nebulizers and MDIs: cleaning and proper use, rinsing mouth with steroid doses and importance of monitoring MDI activations.          Pulmonary Rehab from 02/25/2016 in [facility] Cardiac and Pulmonary Rehab   Date  02/15/16   Educator  LB   Instruction Review Code  2- meets goals/outcomes      General Nutrition Guidelines/Fats and Fiber: -Group instruction provided by verbal, written material, models and posters to present the general guidelines for heart healthy nutrition. Gives an explanation and review of dietary fats and fiber.      Pulmonary Rehab from  04/12/2016 in [facility] Cardiac and Pulmonary Rehab   Date  04/03/16   Educator  CR   Instruction Review Code  2- meets goals/outcomes      Controlling Sodium/Reading Food Labels: -Group verbal and written material supporting the discussion of sodium use in heart healthy nutrition. Review and explanation with models, verbal and written materials for utilization of the food label.   Exercise Physiology & Risk Factors: - Group verbal and written instruction with models to review the exercise physiology of the cardiovascular system and associated critical values. Details cardiovascular disease risk factors and the goals associated with each risk factor.   Aerobic Exercise & Resistance Training: - Gives group verbal and written discussion on the health impact of inactivity. On the components of aerobic and resistive training programs and the benefits of this training and how to safely progress through these programs.   Flexibility, Balance, General Exercise Guidelines: - Provides group verbal and written instruction on the benefits of flexibility and balance training programs. Provides general exercise guidelines with specific guidelines to those with heart or lung disease. Demonstration and skill practice provided.      Pulmonary Rehab from 04/12/2016 in [facility] Cardiac and Pulmonary Rehab   Date  03/08/16   Educator  Salley Hews, PT   Instruction Review Code  2- meets goals/outcomes      Stress Management: - Provides group verbal and written instruction about the health risks of elevated stress, cause of high stress, and healthy ways to reduce stress.      Pulmonary Rehab from 04/12/2016 in [facility] Cardiac and Pulmonary Rehab   Date  03/15/16   Educator  [facility]   Instruction Review Code  2- meets goals/outcomes      Depression: - Provides group verbal and written instruction on the correlation between heart/lung disease and depressed mood, treatment options, and the stigmas associated with  seeking treatment.      Pulmonary Rehab from 04/12/2016 in [facility] Cardiac and Pulmonary Rehab   Date  04/12/16   Educator  Lucianne Lei, [facility]   Instruction Review Code  2- meets goals/outcomes      Exercise & Equipment Safety: - Individual verbal instruction and demonstration of equipment use and safety with use of the equipment.      Pulmonary Rehab from 04/12/2016 in [facility] Cardiac and Pulmonary Rehab   Date  02/28/16   Educator  C. EnterkinRN   Instruction Review Code  2- meets goals/outcomes      Infection Prevention: - Provides verbal and written material to individual with discussion of infection control including proper hand washing and proper equipment cleaning during exercise session.   Falls Prevention: - Provides verbal and written material to individual with discussion of falls prevention and safety.      Pulmonary Rehab from 02/25/2016 in [facility] Cardiac and Pulmonary Rehab   Date  02/15/16   Educator  LB   Instruction Review Code  2- meets goals/outcomes      Diabetes: - Individual verbal and written instruction to review signs/symptoms of diabetes, desired ranges of glucose level fasting, after meals and with exercise. Advice that pre and post exercise glucose checks will be done for 3 sessions at entry of program.   Chronic Lung Diseases: - Group verbal and written instruction to review new updates, new respiratory medications, new advancements in procedures and treatments. Provide informative websites and "800" numbers of self-education.      Pulmonary Rehab from 04/12/2016 in [facility] Cardiac and Pulmonary Rehab   Date  03/06/16   Educator  L. Brown,RT   Instruction Review Code  2- meets goals/outcomes      Lung Procedures: - Group verbal and written instruction to describe testing methods done to diagnose lung disease. Review the outcome of test results. Describe the treatment choices: Pulmonary Function Tests, ABGs and oximetry.   Energy Conservation: - Provide  group verbal and written instruction for methods to conserve energy, plan and organize activities. Instruct on pacing techniques, use of adaptive equipment and posture/positioning to relieve shortness of breath.   Triggers: - Group verbal and written instruction to review types of environmental controls: home humidity, furnaces, filters, dust mite/pet prevention, HEPA vacuums. To discuss weather changes, air quality and the benefits of nasal washing.      Pulmonary Rehab from 02/25/2016 in [facility] Cardiac and Pulmonary Rehab   Date  02/21/16   Educator  LB   Instruction Review Code  2- meets goals/outcomes      Exacerbations: - Group verbal and written instruction to provide: warning signs, infection symptoms, calling MD promptly, preventive modes, and value of vaccinations. Review: effective airway clearance, coughing and/or vibration techniques. Create an Sports administrator.   Oxygen: - Individual and group verbal and written instruction on oxygen therapy. Includes supplement oxygen, available portable oxygen systems, continuous and intermittent flow rates, oxygen safety, concentrators, and Medicare reimbursement for oxygen.   Respiratory Medications: - Group verbal and written instruction to review medications for lung disease. Drug class, frequency, complications, importance of spacers, rinsing mouth after steroid MDI's, and proper cleaning methods for nebulizers.      Pulmonary Rehab from 02/25/2016 in [facility] Cardiac and Pulmonary Rehab   Date  02/15/16   Educator  LB   Instruction Review Code  2- meets goals/outcomes      AED/CPR: - Group verbal and written instruction with the use of models to demonstrate the basic use of the AED with the basic ABC's of resuscitation.   Breathing Retraining: - Provides individuals verbal and written instruction on purpose, frequency, and proper technique of diaphragmatic breathing and pursed-lipped breathing. Applies individual practice skills.       Pulmonary Rehab from 02/25/2016 in [facility] Cardiac and Pulmonary Rehab   Date  02/15/16   Educator  LB   Instruction Review Code  2- meets goals/outcomes      Anatomy and Physiology of  the Lungs: - Group verbal and written instruction with the use of models to provide basic lung anatomy and physiology related to function, structure and complications of lung disease.   Heart Failure: - Group verbal and written instruction on the basics of heart failure: signs/symptoms, treatments, explanation of ejection fraction, enlarged heart and cardiomyopathy.      Pulmonary Rehab from 02/25/2016 in [facility] Cardiac and Pulmonary Rehab   Date  02/25/16   Educator  [place]   Instruction Review Code  2- meets goals/outcomes      Sleep Apnea: - Individual verbal and written instruction to review Obstructive Sleep Apnea. Review of risk factors, methods for diagnosing and types of masks and machines for OSA.   Anxiety: - Provides group, verbal and written instruction on the correlation between heart/lung disease and anxiety, treatment options, and management of anxiety.   Relaxation: - Provides group, verbal and written instruction about the benefits of relaxation for patients with heart/lung disease. Also provides patients with examples of relaxation techniques.      Pulmonary Rehab from 04/12/2016 in [facility] Cardiac and Pulmonary Rehab   Date  03/29/16   Educator  [facility]   Instruction Review Code  2- Meets goals/outcomes      Knowledge Questionnaire Score:     Knowledge Questionnaire Score - 02/15/16 1330    Knowledge Questionnaire Score   Pre Score 10/10       Core Components/Risk Factors/Patient Goals at Admission:     Personal Goals and Risk Factors at Admission - 02/15/16 1330    Core Components/Risk Factors/Patient Goals on Admission    Weight Management Yes;Weight Loss   Intervention Weight Management: Develop a combined nutrition and exercise program designed to reach desired caloric intake,  while maintaining appropriate intake of nutrient and fiber, sodium and fats, and appropriate energy expenditure required for the weight goal.;Weight Management: Provide education and appropriate resources to help participant work on and attain dietary goals.;Weight Management/Obesity: Establish reasonable short term and long term weight goals.   Admit Weight 125 lb (56.7 kg)   Goal Weight: Short Term 120 lb (54.432 kg)   Goal Weight: Long Term 115 lb (52.164 kg)   Expected Outcomes Short Term: Continue to assess and modify interventions until short term weight is achieved;Long Term: Adherence to nutrition and physical activity/exercise program aimed toward attainment of established weight goal;Weight Maintenance: Understanding of the daily nutrition guidelines, which includes 25-35% calories from fat, 7% or less cal from saturated fats, less than 273m cholesterol, less than 1.5gm of sodium, & 5 or more servings of fruits and vegetables daily;Weight Loss: Understanding of general recommendations for a balanced deficit meal plan, which promotes 1-2 lb weight loss per week and includes a negative energy balance of [phone] kcal/d;Understanding recommendations for meals to include 15-35% energy as protein, 25-35% energy from fat, 35-60% energy from carbohydrates, less than 2064mof dietary cholesterol, 20-35 gm of total fiber daily;Understanding of distribution of calorie intake throughout the day with the consumption of 4-5 meals/snacks   Sedentary Yes   Intervention Provide advice, education, support and counseling about physical activity/exercise needs.;Develop an individualized exercise prescription for aerobic and resistive training based on initial evaluation findings, risk stratification, comorbidities and participant's personal goals.   Expected Outcomes Achievement of increased cardiorespiratory fitness and enhanced flexibility, muscular endurance and strength shown through measurements of functional  capacity and personal statement of participant.   Increase Strength and Stamina Yes   Intervention Provide advice, education, support and counseling about physical activity/exercise needs.;Develop  an individualized exercise prescription for aerobic and resistive training based on initial evaluation findings, risk stratification, comorbidities and participant's personal goals.   Expected Outcomes Achievement of increased cardiorespiratory fitness and enhanced flexibility, muscular endurance and strength shown through measurements of functional capacity and personal statement of participant.   Improve shortness of breath with ADL's Yes   Intervention Provide education, individualized exercise plan and daily activity instruction to help decrease symptoms of SOB with activities of daily living.   Expected Outcomes Short Term: Achieves a reduction of symptoms when performing activities of daily living.   Develop more efficient breathing techniques such as purse lipped breathing and diaphragmatic breathing; and practicing self-pacing with activity Yes   Intervention Provide education, demonstration and support about specific breathing techniuqes utilized for more efficient breathing. Include techniques such as pursed lipped breathing, diaphragmatic breathing and self-pacing activity.   Expected Outcomes Short Term: Participant will be able to demonstrate and use breathing techniques as needed throughout daily activities.   Increase knowledge of respiratory medications and ability to use respiratory devices properly  Yes  MDI's Symbicort, Ventolin, Combivent; SVN Albuterol and Atrovent   Intervention Provide education and demonstration as needed of appropriate use of medications, inhalers, and oxygen therapy.   Expected Outcomes Short Term: Achieves understanding of medications use. Understands that oxygen is a medication prescribed by physician. Demonstrates appropriate use of inhaler and oxygen therapy.       Core Components/Risk Factors/Patient Goals Review:      Goals and Risk Factor Review      02/22/16 0707 03/06/16 1130 03/22/16 1214 04/03/16 1130     Core Components/Risk Factors/Patient Goals Review   Personal Goals Review Develop more efficient breathing techniques such as purse lipped breathing and diaphragmatic breathing and practicing self-pacing with activity. Increase Strength and Stamina;Improve shortness of breath with ADL's;Increase knowledge of respiratory medications and ability to use respiratory devices properly. Develop more efficient breathing techniques such as purse lipped breathing and diaphragmatic breathing and practicing self-pacing with activity.;Improve shortness of breath with ADL's;Sedentary Increase knowledge of respiratory medications and ability to use respiratory devices properly.    Review Heidi [name] first day of exercise in [place] - she had been in the program before and adjusted to the exercise goals and equipment set-up. Using PLB is part of her daily management of her shortness of breath with activies. She has good technique and needs no queing with her exerciae goals in [facility].  Heidi [name] has noticed an improvement in her shortness of breath since starting [facility]. She knows her limits and knows when to stop and rest. Her stamina and energy level has increased since she was so sick with the flu.  We discussed her MDI's and  is using her spacer . When she goes out for the day, Heidi [name]  will take a SVN treatment  for her breathing. Pt is exercising 1 additional day a week at home using stationary bike and weights. She has been using pursed lip breathing to improve her ADLs.  High heat days are still making in hard for her to breathe in general.  She is only needing to use her O2 at night.  She has noticed the biggest difference in her breathing on the treadmill when using pursed lip breathing.. Reviewed Heidi [name]'s MDI's - she uses her Combivent for rescue  and has the Ventolin MDI as back-up. She is using her SVN everyday at least once a day and uses her Symibort as prescribed with a spacer.  Expected Outcomes Continue using PLB daily for activity at home and with exercise goals in [facility].  Plan to continue to see improvements and will eventually add in 2nd day at home for exercise. Continue to use MDI's and SVN as ordered and use spacer for the Combivent and Symbicort.       Core Components/Risk Factors/Patient Goals at Discharge (Final Review):      Goals and Risk Factor Review - 04/03/16 1130    Core Components/Risk Factors/Patient Goals Review   Personal Goals Review Increase knowledge of respiratory medications and ability to use respiratory devices properly.   Review Reviewed Heidi [name]'s MDI's - she uses her Combivent for rescue and has the Ventolin MDI as back-up. She is using her SVN everyday at least once a day and uses her Symibort as prescribed with a spacer.   Expected Outcomes Continue to use MDI's and SVN as ordered and use spacer for the Combivent and Symbicort.      ITP Comments:     ITP Comments      05/03/16 1342           ITP Comments Cynthias last visit was 04/12/16          Comments: 30 Day Review

## 2016-05-08 NOTE — Progress Notes (Signed)
Pulmonary Individual Treatment Plan  Patient Details  Name: Heidi [name] MRN: [id number] Date of Birth: 21-Mar-1956 Referring Provider:        Documentation from 04/10/2016 in [facility] Cardiac and Pulmonary Rehab   Referring Provider  Dr. Simonne Maffucci      Initial Encounter Date:       Documentation from 04/10/2016 in [facility] Cardiac and Pulmonary Rehab   Referring Provider  Dr. Simonne Maffucci      Visit Diagnosis: No diagnosis found.  Patient's Home Medications on Admission:  Current outpatient prescriptions:    albuterol (PROVENTIL) (2.5 MG/3ML) 0.083% nebulizer solution, USE ONE VIAL IN NEBULIZER EVERY 6 HOURS AS NEEDED FOR WHEEZING OR SHORTNESS OF BREATH, Disp: 360 mL, Rfl: 5   ALPRAZolam (XANAX) 0.25 MG tablet, Take 0.25 mg by mouth at bedtime as needed for anxiety., Disp: , Rfl:    budesonide-formoterol (SYMBICORT) 160-4.5 MCG/ACT inhaler, Inhale 2 puffs into the lungs 2 (two) times daily., Disp: 1 Inhaler, Rfl: 6   escitalopram (LEXAPRO) 5 MG tablet, Take 20 mg by mouth daily. , Disp: , Rfl:    fluticasone (FLONASE) 50 MCG/ACT nasal spray, USE TWO SPRAY(S) IN EACH NOSTRIL ONCE DAILY, Disp: 16 g, Rfl: 5   ipratropium (ATROVENT) 0.02 % nebulizer solution, Take 2.5 mLs (0.5 mg total) by nebulization every 6 (six) hours as needed for wheezing or shortness of breath. Dx Code J43.2, Disp: 360 mL, Rfl: 3   ipratropium (ATROVENT) 0.02 % nebulizer solution, INHALE ONE VIAL EVERY 6 HOURS AS NEEDED FOR WHEEZING OR SHORTNESS OF BREATH, Disp: 360 mL, Rfl: 5   Ipratropium-Albuterol (COMBIVENT RESPIMAT) 20-100 MCG/ACT AERS respimat, Inhale 1 puff into the lungs every 6 (six) hours as needed for wheezing., Disp: 1 Inhaler, Rfl: 5   pantoprazole (PROTONIX) 40 MG tablet, Take 40 mg by mouth 2 (two) times daily. Take 30-60 min before first meal of the day, Disp: , Rfl:   Past Medical History: Past Medical History  Diagnosis Date   COPD (chronic obstructive pulmonary disease) ([place])     NSTEMI (non-ST elevated myocardial infarction) ([place])     12/2011 with normal coronaries possibly secondary to Takotsubo (EF 30-35% by echo), occurred following two episodes of acute respiratory distress   Hypotension     limiting med titration   Takotsubo syndrome 4/13   GERD (gastroesophageal reflux disease)     Tobacco Use: History  Smoking status   Former Smoker -- 1.00 packs/day for 30 years   Types: Cigarettes   Quit date: 01/21/2012  Smokeless tobacco   Never Used    Labs: Recent Review Flowsheet Data    Labs for ITP Cardiac and Pulmonary Rehab Latest Ref Rng 01/23/2012 01/24/2012 02/01/2012 08/08/2012 08/08/2012   Cholestrol 0 - 200 mg/dL - 134 - - -   LDLCALC 0 - 99 mg/dL - 41 - - -   HDL >39 mg/dL - 80 - - -   Trlycerides <150 mg/dL - 65 - - -   Hemoglobin A1c <5.7 % 5.6 - - - -   PHART 7.350 - 7.450 7.391 - 7.409(H) 7.206(L) 7.332(L)   PCO2ART 35.0 - 45.0 mmHg 38.4 - 39.4 46.5(H) 47.3(H)   HCO3 20.0 - 24.0 mEq/L 23.3 - 24.4(H) 18.4(L) 25.1(H)   TCO2 0 - 100 mmol/L 24 - 21.'3 20 27   ' ACIDBASEDEF 0.0 - 2.0 mmol/L 1.0 - - 9.0(H) 1.0   O2SAT - 98.0 - 94.1 100.0 99.0       ADL UCSD:     Pulmonary  Assessment Scores      02/15/16 1330       ADL UCSD   ADL Phase Entry     SOB Score total 55     Rest 0     Walk 1     Stairs 3     Bath 1     Dress 1     Shop 2        Pulmonary Function Assessment:     Pulmonary Function Assessment - 02/15/16 1420    Pulmonary Function Tests   RV% 178 %   DLCO% 48 %   Initial Spirometry Results   FVC% 45 %   FEV1% 27 %   FEV1/FVC Ratio 45   Post Bronchodilator Spirometry Results   FVC% 63 %   FEV1% 32 %   FEV1/FVC Ratio 38   Breath   Bilateral Breath Sounds Clear;Decreased   Shortness of Breath Yes;Fear of Shortness of Breath;Limiting activity      Exercise Target Goals:    Exercise Program Goal: Individual exercise prescription set with THRR, safety & activity barriers. Participant demonstrates ability  to understand and report RPE using BORG scale, to self-measure pulse accurately, and to acknowledge the importance of the exercise prescription.  Exercise Prescription Goal: Starting with aerobic activity 30 plus minutes a day, 3 days per week for initial exercise prescription. Provide home exercise prescription and guidelines that participant acknowledges understanding prior to discharge.  Activity Barriers & Risk Stratification:     Activity Barriers & Cardiac Risk Stratification - 02/15/16 1330    Activity Barriers & Cardiac Risk Stratification   Activity Barriers Shortness of Breath;Deconditioning   Cardiac Risk Stratification Moderate      6 Minute Walk:     6 Minute Walk      02/15/16 1435 02/15/16 1447     6 Minute Walk   Phase Initial     Distance 1070 feet     Walk Time 6 minutes     MPH 2     RPE 12     Resting HR 88 bpm     Resting BP 118/68 mmHg     Max Ex. HR 91 bpm     Max Ex. BP 122/72 mmHg     Interval Oxygen   Interval Oxygen?  Yes    Baseline Oxygen Saturation %  95 %    6 Minute Oxygen Saturation %  95 %    2 Minute Post Oxygen Saturation %  95 %       Initial Exercise Prescription:     Initial Exercise Prescription - 04/10/16 1400    Date of Initial Exercise RX and Referring Provider   Referring Provider Dr. Simonne Maffucci      Perform Capillary Blood Glucose checks as needed.  Exercise Prescription Changes:     Exercise Prescription Changes      02/22/16 0700 02/28/16 1400 03/08/16 1200 04/03/16 1300 04/12/16 1200   Exercise Review   Progression  Yes Yes Yes Yes   Response to Exercise   Blood Pressure (Admit) 130/80 mmHg 118/78 mmHg 118/78 mmHg 122/62 mmHg    Blood Pressure (Exercise) 138/78 mmHg 148/70 mmHg 148/70 mmHg 138/84 mmHg    Blood Pressure (Exit) 114/64 mmHg 124/66 mmHg 124/66 mmHg 112/60 mmHg    Heart Rate (Admit) 99 bpm 78 bpm 78 bpm 80 bpm    Heart Rate (Exercise) 98 bpm 114 bpm 114 bpm 114 bpm    Heart Rate (Exit) 74  bpm 87 bpm  87 bpm 85 bpm    Oxygen Saturation (Admit) 94 % 95 % 95 % 98 %    Oxygen Saturation (Exercise) 92 % 96 % 96 % 95 %    Oxygen Saturation (Exit) 97 % 95 % 95 % 98 %    Rating of Perceived Exertion (Exercise) '12 12 12 13    ' Perceived Dyspnea (Exercise) 3 2.5 2.5 3    Symptoms     none   Comments data entry for 02/21/2016 - Ms Malloy's first day of exercise in [place].     Home Exercise Given 04/12/16   Duration  Progress to 50 minutes of aerobic without signs/symptoms of physical distress Progress to 50 minutes of aerobic without signs/symptoms of physical distress Progress to 50 minutes of aerobic without signs/symptoms of physical distress Progress to 50 minutes of aerobic without signs/symptoms of physical distress   Intensity  Rest + 30 Rest + 30 THRR New THRR New   Progression   Progression    --  115-146 --  115-146   Resistance Training   Training Prescription Yes Yes Yes Yes Yes   Weight '2 2 3 3 3   ' Reps 10-12 10-12 10-12 10-12 10-12   Treadmill   MPH 1.5 1.5 1.'5 2 2   ' Grade 0 0 0 0 0   Minutes '15 15 15 15  ' 10/5 15  10/5   NuStep   Level '2 2 2 3 3   ' Watts 40 40 40 60 60   Minutes '10 15 15 15 15   ' REL-XR   Level   '3 3 3   ' Watts   60 60 60   Biostep-RELP   Level '2 2 2 2 2   ' Watts '20 20 20 20 20   ' Minutes '15 15 15 15 15   ' Home Exercise Plan   Plans to continue exercise at     Home  walking and rower   Frequency     Add 2 additional days to program exercise sessions.     04/18/16 1600           Exercise Review   Progression Yes       Response to Exercise   Blood Pressure (Admit) 110/60 mmHg       Blood Pressure (Exercise) 148/80 mmHg       Blood Pressure (Exit) 116/60 mmHg       Heart Rate (Admit) 96 bpm       Heart Rate (Exercise) 112 bpm       Heart Rate (Exit) 98 bpm       Oxygen Saturation (Admit) 95 %       Oxygen Saturation (Exercise) 91 %       Oxygen Saturation (Exit) 97 %       Rating of Perceived Exertion (Exercise) 13       Intensity  THRR unchanged       Resistance Training   Training Prescription Yes       Weight 3       Reps 10-12       Treadmill   MPH 2       Grade 0       Minutes 15       NuStep   Level 3       Watts 40       Minutes 15       REL-XR   Level 4       Watts 60  Exercise Comments:     Exercise Comments      03/22/16 1217 04/03/16 1535 04/12/16 1251 04/18/16 1606 05/03/16 1340   Exercise Comments Pt plans to make more progress in program to work to 15 min continuous on all equipment before adding in 2nd day of home exercise. Ms Printup will start the 1:15 FF class when she has completed 18 sessions of [facility]. Reviewed home exercise with pt today.  Pt plans to walk and use stationary rower at home for exercise.  We discussed her progression of exercise.  Reviewed THR, pulse, RPE, sign and symptoms, and when to call 911 or MD.  Also discussed weather considerations and indoor options.  Pt voiced understanding. Ms Cyphers is progessing well with exercise  Cynthias last visit was 04/12/16      Discharge Exercise Prescription (Final Exercise Prescription Changes):     Exercise Prescription Changes - 04/18/16 1600    Exercise Review   Progression Yes   Response to Exercise   Blood Pressure (Admit) 110/60 mmHg   Blood Pressure (Exercise) 148/80 mmHg   Blood Pressure (Exit) 116/60 mmHg   Heart Rate (Admit) 96 bpm   Heart Rate (Exercise) 112 bpm   Heart Rate (Exit) 98 bpm   Oxygen Saturation (Admit) 95 %   Oxygen Saturation (Exercise) 91 %   Oxygen Saturation (Exit) 97 %   Rating of Perceived Exertion (Exercise) 13   Intensity THRR unchanged   Resistance Training   Training Prescription Yes   Weight 3   Reps 10-12   Treadmill   MPH 2   Grade 0   Minutes 15   NuStep   Level 3   Watts 40   Minutes 15   REL-XR   Level 4   Watts 60       Nutrition:  Target Goals: Understanding of nutrition guidelines, daily intake of sodium <1568m, cholesterol <2067m calories 30% from  fat and 7% or less from saturated fats, daily to have 5 or more servings of fruits and vegetables.  Biometrics:     Pre Biometrics - 02/15/16 1438    Pre Biometrics   Height 5' 5.5" (1.664 m)   Weight 122 lb 6.4 oz (55.52 kg)   Waist Circumference 37 inches   Hip Circumference 35 inches   Waist to Hip Ratio 1.06 %   BMI (Calculated) 20.1       Nutrition Therapy Plan and Nutrition Goals:     Nutrition Therapy & Goals - 02/15/16 1330    Nutrition Therapy   Diet Ms DoLylesould like to meet with the dietitian; large portion sizes, but low salt and sweets; she does like fruits and vegetables; she has cut her soda to 2 cans a day.      Nutrition Discharge: Rate Your Plate Scores:   Psychosocial: Target Goals: Acknowledge presence or absence of depression, maximize coping skills, provide positive support system. Participant is able to verbalize types and ability to use techniques and skills needed for reducing stress and depression.  Initial Review & Psychosocial Screening:     Initial Psych Review & Screening - 02/15/16 1330    Family Dynamics   Good Support System? Yes   Comments Ms DoKinneras good support from her boyfriend and daughter. Her hospital admission for COPD exacerbation in March was quite serious and is a concern for her to manage her COPD.   Barriers   Psychosocial barriers to participate in program The patient should benefit from training in stress management  and relaxation.   Screening Interventions   Interventions Encouraged to exercise;Program counselor consult      Quality of Life Scores:     Quality of Life - 02/15/16 1435    Quality of Life Scores   Health/Function Pre 18.5 %   Socioeconomic Pre 23.29 %   Psych/Spiritual Pre 22.86 %   Family Pre 28.8 %   GLOBAL Pre 21.8 %      PHQ-9:     Recent Review Flowsheet Data    Depression screen [facility] 2/9 02/15/2016   Decreased Interest 0   Down, Depressed, Hopeless 0   PHQ - 2 Score 0   Altered  sleeping 1   Tired, decreased energy 1   Change in appetite 1   Feeling bad or failure about yourself  0   Trouble concentrating 0   Moving slowly or fidgety/restless 0   Suicidal thoughts 0   PHQ-9 Score 3   Difficult doing work/chores Not difficult at all      Psychosocial Evaluation and Intervention:     Psychosocial Evaluation - 02/21/16 1238    Psychosocial Evaluation & Interventions   Interventions Encouraged to exercise with the program and follow exercise prescription   Comments Counselor met with Ms. Bacus today for initial psychosocial evaluation.  She is almost 60 years old and was diagnosed with COPD in 2013.  Ms. Forquer has been in this program before, but had the flu one month ago and that has exacerbated her health condition.  She has a strong support system with a daughter and son who live close by and a significant other who is in the same home with her.  Ms. West states she sleeps well now with Oxygen now and takes a Xanax & Unisom PRN.  She does not have a great appetite.  She denies a history of depression but has struggled with Anxiety and has been on Lexapro for 2 years to address this.  She reports no current symptoms of either at this time.  Ms. Lamos states she is typically in a positive mood, and other than her health she has minimal stress.  Her goals for this program are to breathe better and improve her health overall.  She plans to return to the [employer] [facility] as a follow up upon completion of pulmonary rehab.     Continued Psychosocial Services Needed Yes  Ms. Brocksmith will benefit from the psychoeducational components of this program, especially stress management.        Psychosocial Re-Evaluation:     Psychosocial Re-Evaluation      03/15/16 1257 04/12/16 1232         Psychosocial Re-Evaluation   Comments Counselor follow up with Ms. craigo today reporting she has been experiencing "tremors" more lately and had a negative reaction to  the medication her PCP gave her.  She asked for information on local psychiatrists as she thinks she would benefit from a medication evaluation.  Counselor assessed her for depression and anxiety with ms. Pitstick reporting her anxiety levels are high and although exercise helps decrease this, it is only temporary.  Counselor provided information on psychiatrists for her to consider to help with this.  Counselor will continue to follow with Ms. Oncale during this course of this program.   Counselor follow up with Ms. Mcneese today stating her mood is stable and the tremors have d/c.  She states an increase in energy and strength since coming into this program and that she "  definitely feels better."        Education: Education Goals: Education classes will be provided on a weekly basis, covering required topics. Participant will state understanding/return demonstration of topics presented.  Learning Barriers/Preferences:     Learning Barriers/Preferences - 02/15/16 1330    Learning Barriers/Preferences   Learning Barriers None   Learning Preferences Group Instruction;Individual Instruction;Pictoral;Skilled Demonstration;Verbal Instruction;Video;Written Material      Education Topics: Initial Evaluation Education: - Verbal, written and demonstration of respiratory meds, RPE/PD scales, oximetry and breathing techniques. Instruction on use of nebulizers and MDIs: cleaning and proper use, rinsing mouth with steroid doses and importance of monitoring MDI activations.          Pulmonary Rehab from 02/25/2016 in [facility] Cardiac and Pulmonary Rehab   Date  02/15/16   Educator  LB   Instruction Review Code  2- meets goals/outcomes      General Nutrition Guidelines/Fats and Fiber: -Group instruction provided by verbal, written material, models and posters to present the general guidelines for heart healthy nutrition. Gives an explanation and review of dietary fats and fiber.      Pulmonary Rehab from  04/12/2016 in [facility] Cardiac and Pulmonary Rehab   Date  04/03/16   Educator  CR   Instruction Review Code  2- meets goals/outcomes      Controlling Sodium/Reading Food Labels: -Group verbal and written material supporting the discussion of sodium use in heart healthy nutrition. Review and explanation with models, verbal and written materials for utilization of the food label.   Exercise Physiology & Risk Factors: - Group verbal and written instruction with models to review the exercise physiology of the cardiovascular system and associated critical values. Details cardiovascular disease risk factors and the goals associated with each risk factor.   Aerobic Exercise & Resistance Training: - Gives group verbal and written discussion on the health impact of inactivity. On the components of aerobic and resistive training programs and the benefits of this training and how to safely progress through these programs.   Flexibility, Balance, General Exercise Guidelines: - Provides group verbal and written instruction on the benefits of flexibility and balance training programs. Provides general exercise guidelines with specific guidelines to those with heart or lung disease. Demonstration and skill practice provided.      Pulmonary Rehab from 04/12/2016 in [facility] Cardiac and Pulmonary Rehab   Date  03/08/16   Educator  Salley Hews, PT   Instruction Review Code  2- meets goals/outcomes      Stress Management: - Provides group verbal and written instruction about the health risks of elevated stress, cause of high stress, and healthy ways to reduce stress.      Pulmonary Rehab from 04/12/2016 in [facility] Cardiac and Pulmonary Rehab   Date  03/15/16   Educator  [facility]   Instruction Review Code  2- meets goals/outcomes      Depression: - Provides group verbal and written instruction on the correlation between heart/lung disease and depressed mood, treatment options, and the stigmas associated with  seeking treatment.      Pulmonary Rehab from 04/12/2016 in [facility] Cardiac and Pulmonary Rehab   Date  04/12/16   Educator  Lucianne Lei, [facility]   Instruction Review Code  2- meets goals/outcomes      Exercise & Equipment Safety: - Individual verbal instruction and demonstration of equipment use and safety with use of the equipment.      Pulmonary Rehab from 04/12/2016 in [facility] Cardiac and Pulmonary Rehab   Date  02/28/16   Educator  C. EnterkinRN   Instruction Review Code  2- meets goals/outcomes      Infection Prevention: - Provides verbal and written material to individual with discussion of infection control including proper hand washing and proper equipment cleaning during exercise session.   Falls Prevention: - Provides verbal and written material to individual with discussion of falls prevention and safety.      Pulmonary Rehab from 02/25/2016 in [facility] Cardiac and Pulmonary Rehab   Date  02/15/16   Educator  LB   Instruction Review Code  2- meets goals/outcomes      Diabetes: - Individual verbal and written instruction to review signs/symptoms of diabetes, desired ranges of glucose level fasting, after meals and with exercise. Advice that pre and post exercise glucose checks will be done for 3 sessions at entry of program.   Chronic Lung Diseases: - Group verbal and written instruction to review new updates, new respiratory medications, new advancements in procedures and treatments. Provide informative websites and "800" numbers of self-education.      Pulmonary Rehab from 04/12/2016 in [facility] Cardiac and Pulmonary Rehab   Date  03/06/16   Educator  L. Brown,RT   Instruction Review Code  2- meets goals/outcomes      Lung Procedures: - Group verbal and written instruction to describe testing methods done to diagnose lung disease. Review the outcome of test results. Describe the treatment choices: Pulmonary Function Tests, ABGs and oximetry.   Energy Conservation: - Provide  group verbal and written instruction for methods to conserve energy, plan and organize activities. Instruct on pacing techniques, use of adaptive equipment and posture/positioning to relieve shortness of breath.   Triggers: - Group verbal and written instruction to review types of environmental controls: home humidity, furnaces, filters, dust mite/pet prevention, HEPA vacuums. To discuss weather changes, air quality and the benefits of nasal washing.      Pulmonary Rehab from 02/25/2016 in [facility] Cardiac and Pulmonary Rehab   Date  02/21/16   Educator  LB   Instruction Review Code  2- meets goals/outcomes      Exacerbations: - Group verbal and written instruction to provide: warning signs, infection symptoms, calling MD promptly, preventive modes, and value of vaccinations. Review: effective airway clearance, coughing and/or vibration techniques. Create an Sports administrator.   Oxygen: - Individual and group verbal and written instruction on oxygen therapy. Includes supplement oxygen, available portable oxygen systems, continuous and intermittent flow rates, oxygen safety, concentrators, and Medicare reimbursement for oxygen.   Respiratory Medications: - Group verbal and written instruction to review medications for lung disease. Drug class, frequency, complications, importance of spacers, rinsing mouth after steroid MDI's, and proper cleaning methods for nebulizers.      Pulmonary Rehab from 02/25/2016 in [facility] Cardiac and Pulmonary Rehab   Date  02/15/16   Educator  LB   Instruction Review Code  2- meets goals/outcomes      AED/CPR: - Group verbal and written instruction with the use of models to demonstrate the basic use of the AED with the basic ABC's of resuscitation.   Breathing Retraining: - Provides individuals verbal and written instruction on purpose, frequency, and proper technique of diaphragmatic breathing and pursed-lipped breathing. Applies individual practice skills.       Pulmonary Rehab from 02/25/2016 in [facility] Cardiac and Pulmonary Rehab   Date  02/15/16   Educator  LB   Instruction Review Code  2- meets goals/outcomes      Anatomy and Physiology of  the Lungs: - Group verbal and written instruction with the use of models to provide basic lung anatomy and physiology related to function, structure and complications of lung disease.   Heart Failure: - Group verbal and written instruction on the basics of heart failure: signs/symptoms, treatments, explanation of ejection fraction, enlarged heart and cardiomyopathy.      Pulmonary Rehab from 02/25/2016 in [facility] Cardiac and Pulmonary Rehab   Date  02/25/16   Educator  [place]   Instruction Review Code  2- meets goals/outcomes      Sleep Apnea: - Individual verbal and written instruction to review Obstructive Sleep Apnea. Review of risk factors, methods for diagnosing and types of masks and machines for OSA.   Anxiety: - Provides group, verbal and written instruction on the correlation between heart/lung disease and anxiety, treatment options, and management of anxiety.   Relaxation: - Provides group, verbal and written instruction about the benefits of relaxation for patients with heart/lung disease. Also provides patients with examples of relaxation techniques.      Pulmonary Rehab from 04/12/2016 in [facility] Cardiac and Pulmonary Rehab   Date  03/29/16   Educator  [facility]   Instruction Review Code  2- Meets goals/outcomes      Knowledge Questionnaire Score:     Knowledge Questionnaire Score - 02/15/16 1330    Knowledge Questionnaire Score   Pre Score 10/10       Core Components/Risk Factors/Patient Goals at Admission:     Personal Goals and Risk Factors at Admission - 02/15/16 1330    Core Components/Risk Factors/Patient Goals on Admission    Weight Management Yes;Weight Loss   Intervention Weight Management: Develop a combined nutrition and exercise program designed to reach desired caloric intake,  while maintaining appropriate intake of nutrient and fiber, sodium and fats, and appropriate energy expenditure required for the weight goal.;Weight Management: Provide education and appropriate resources to help participant work on and attain dietary goals.;Weight Management/Obesity: Establish reasonable short term and long term weight goals.   Admit Weight 125 lb (56.7 kg)   Goal Weight: Short Term 120 lb (54.432 kg)   Goal Weight: Long Term 115 lb (52.164 kg)   Expected Outcomes Short Term: Continue to assess and modify interventions until short term weight is achieved;Long Term: Adherence to nutrition and physical activity/exercise program aimed toward attainment of established weight goal;Weight Maintenance: Understanding of the daily nutrition guidelines, which includes 25-35% calories from fat, 7% or less cal from saturated fats, less than 245m cholesterol, less than 1.5gm of sodium, & 5 or more servings of fruits and vegetables daily;Weight Loss: Understanding of general recommendations for a balanced deficit meal plan, which promotes 1-2 lb weight loss per week and includes a negative energy balance of [phone] kcal/d;Understanding recommendations for meals to include 15-35% energy as protein, 25-35% energy from fat, 35-60% energy from carbohydrates, less than 2045mof dietary cholesterol, 20-35 gm of total fiber daily;Understanding of distribution of calorie intake throughout the day with the consumption of 4-5 meals/snacks   Sedentary Yes   Intervention Provide advice, education, support and counseling about physical activity/exercise needs.;Develop an individualized exercise prescription for aerobic and resistive training based on initial evaluation findings, risk stratification, comorbidities and participant's personal goals.   Expected Outcomes Achievement of increased cardiorespiratory fitness and enhanced flexibility, muscular endurance and strength shown through measurements of functional  capacity and personal statement of participant.   Increase Strength and Stamina Yes   Intervention Provide advice, education, support and counseling about physical activity/exercise needs.;Develop  an individualized exercise prescription for aerobic and resistive training based on initial evaluation findings, risk stratification, comorbidities and participant's personal goals.   Expected Outcomes Achievement of increased cardiorespiratory fitness and enhanced flexibility, muscular endurance and strength shown through measurements of functional capacity and personal statement of participant.   Improve shortness of breath with ADL's Yes   Intervention Provide education, individualized exercise plan and daily activity instruction to help decrease symptoms of SOB with activities of daily living.   Expected Outcomes Short Term: Achieves a reduction of symptoms when performing activities of daily living.   Develop more efficient breathing techniques such as purse lipped breathing and diaphragmatic breathing; and practicing self-pacing with activity Yes   Intervention Provide education, demonstration and support about specific breathing techniuqes utilized for more efficient breathing. Include techniques such as pursed lipped breathing, diaphragmatic breathing and self-pacing activity.   Expected Outcomes Short Term: Participant will be able to demonstrate and use breathing techniques as needed throughout daily activities.   Increase knowledge of respiratory medications and ability to use respiratory devices properly  Yes  MDI's Symbicort, Ventolin, Combivent; SVN Albuterol and Atrovent   Intervention Provide education and demonstration as needed of appropriate use of medications, inhalers, and oxygen therapy.   Expected Outcomes Short Term: Achieves understanding of medications use. Understands that oxygen is a medication prescribed by physician. Demonstrates appropriate use of inhaler and oxygen therapy.       Core Components/Risk Factors/Patient Goals Review:      Goals and Risk Factor Review      02/22/16 0707 03/06/16 1130 03/22/16 1214 04/03/16 1130     Core Components/Risk Factors/Patient Goals Review   Personal Goals Review Develop more efficient breathing techniques such as purse lipped breathing and diaphragmatic breathing and practicing self-pacing with activity. Increase Strength and Stamina;Improve shortness of breath with ADL's;Increase knowledge of respiratory medications and ability to use respiratory devices properly. Develop more efficient breathing techniques such as purse lipped breathing and diaphragmatic breathing and practicing self-pacing with activity.;Improve shortness of breath with ADL's;Sedentary Increase knowledge of respiratory medications and ability to use respiratory devices properly.    Review Ms Lipsky first day of exercise in [place] - she had been in the program before and adjusted to the exercise goals and equipment set-up. Using PLB is part of her daily management of her shortness of breath with activies. She has good technique and needs no queing with her exerciae goals in [facility].  Ms Tseng has noticed an improvement in her shortness of breath since starting [facility]. She knows her limits and knows when to stop and rest. Her stamina and energy level has increased since she was so sick with the flu.  We discussed her MDI's and  is using her spacer . When she goes out for the day, Ms Harts  will take a SVN treatment  for her breathing. Pt is exercising 1 additional day a week at home using stationary bike and weights. She has been using pursed lip breathing to improve her ADLs.  High heat days are still making in hard for her to breathe in general.  She is only needing to use her O2 at night.  She has noticed the biggest difference in her breathing on the treadmill when using pursed lip breathing.. Reviewed Ms Lama's MDI's - she uses her Combivent for rescue  and has the Ventolin MDI as back-up. She is using her SVN everyday at least once a day and uses her Symibort as prescribed with a spacer.  Expected Outcomes Continue using PLB daily for activity at home and with exercise goals in [facility].  Plan to continue to see improvements and will eventually add in 2nd day at home for exercise. Continue to use MDI's and SVN as ordered and use spacer for the Combivent and Symbicort.       Core Components/Risk Factors/Patient Goals at Discharge (Final Review):      Goals and Risk Factor Review - 04/03/16 1130    Core Components/Risk Factors/Patient Goals Review   Personal Goals Review Increase knowledge of respiratory medications and ability to use respiratory devices properly.   Review Reviewed Ms Lank's MDI's - she uses her Combivent for rescue and has the Ventolin MDI as back-up. She is using her SVN everyday at least once a day and uses her Symibort as prescribed with a spacer.   Expected Outcomes Continue to use MDI's and SVN as ordered and use spacer for the Combivent and Symbicort.      ITP Comments:     ITP Comments      05/03/16 1342 05/08/16 1620         ITP Comments Cynthias last visit was 04/12/16 Ms Dorian called today and is with her father who has been admitted to the hospital with illness. She has also been with a family member who is at [facility]. Her plan is to return to [place] next week.         Comments: 30 day note review

## 2016-05-17 ENCOUNTER — Encounter: Payer: Medicare Other | Admitting: *Deleted

## 2016-05-17 DIAGNOSIS — J449 Chronic obstructive pulmonary disease, unspecified: Secondary | ICD-10-CM

## 2016-05-17 NOTE — Progress Notes (Signed)
Daily Session Note  Patient Details  Name: Heidi [name] MRN: [id number] Date of Birth: [birth year] Referring Provider:        Documentation from 04/10/2016 in [facility] Card[facility] and Pul[facility] Rehab   Referring Provider  Dr. Simonne Maffucci      Encounter Date: 05/17/2016  Check In:     Session Check In - 05/17/16 1148    Check-In   Location [facility]-Cardiac & Pulmonary Rehab   Staff Present Carson Myrtle, BS, RRT, Respiratory Lennie Hummer, MA, ACSM RCEP, Exercise Physiologist;Amanda Oletta Darter, BA, ACSM CEP, Exercise Physiologist;Diane Joya Gaskins, RN, BSN   Supervising physician immediately available to respond to emergencies [facility] immediately available ER MD   Physician(s) Cinda Quest and Jimmye Norman   Medication changes reported     No   Fall or balance concerns reported    No   Warm-up and Cool-down Performed on first and last piece of equipment   Resistance Training Performed Yes   VAD Patient? No   Pain Assessment   Currently in Pain? No/denies         Goals Met:  Proper associated with RPD/PD & O2 Sat Exercise tolerated well Strength training completed today  Goals Unmet:  Not Applicable  Comments:  Pt able to follow exercise prescription today without complaint.  Will continue to monitor for progression.    Dr. Emily Filbert is Medical Director for [place] and [facility].

## 2016-05-19 ENCOUNTER — Encounter: Payer: Medicare Other | Admitting: Respiratory Therapy

## 2016-05-19 DIAGNOSIS — J449 Chronic obstructive pulmonary disease, unspecified: Secondary | ICD-10-CM

## 2016-05-19 NOTE — Progress Notes (Signed)
Daily Session Note  Patient Details  Name: Heidi [name] MRN: [id number] Date of Birth: 04/20/1956 Referring Provider:        Documentation from 04/10/2016 in [facility] Cardiac and Pulmonary Re[facility]   Referring Provider  Dr. Douglas McQuaid      Encounter Date: 05/19/2016  Check In:     Session Check In - 05/19/16 1339    Check-In   Location [facility]-Cardiac & Pul[facility] Re[facility]   Staff Present Stacey Joyce, RRT, RCP, Respiratory Therapist;Susanne Bice, RN, BSN, CCRP;Jessica Hawkins, MA, ACSM RCEP, Exercise Physiologist   Supervising physician immediately available to respond to emergencies [facility] immediately available ER MD   Physician(s) Dr. Malinda and Williams   Medication changes reported     No   Fall or balance concerns reported    No   Warm-up and Cool-down Performed on first and last piece of equipment   Resistance Training Performed Yes   VAD Patient? No   Pain Assessment   Currently in Pain? No/denies   Multiple Pain Sites No         Goals Met:  Proper associated with RPD/PD & O2 Sat Independence with exercise equipment Using PLB without cueing & demonstrates good technique Exercise tolerated well Strength training completed today  Goals Unmet:  Not Applicable  Comments: Pt able to follow exercise prescription today without complaint.  Will continue to monitor for progression.    Dr. Mark Miller is Medical Director for [facility] and [facility]. 

## 2016-05-22 ENCOUNTER — Encounter: Payer: Medicare Other | Admitting: *Deleted

## 2016-05-22 DIAGNOSIS — J449 Chronic obstructive pulmonary disease, unspecified: Secondary | ICD-10-CM | POA: Diagnosis not present

## 2016-05-22 NOTE — Progress Notes (Signed)
Daily Session Note  Patient Details  Name: Heidi [name] MRN: [id number] Date of Birth: 02/26/1956 Referring Provider:   Flowsheet Row Documentation from 04/10/2016 in [facility] Cardiac and Pul[facility] Rehab  Referring Provider  Dr. Douglas McQuaid      Encounter Date: 05/22/2016  Check In:     Session Check In - 05/22/16 1330      Check-In   Location [facility]iac & [facility] Re[facility]   Staff Present Laureen Brown, BS, RRT, Respiratory Therapist;Kelly Hayes, BS, ACSM CEP, Exercise Physiologist;Mary Jo Abernethy, RN, BSN, MA   Supervising physician immediately available to respond to emergencies [facility] immediately available ER MD   Physician(s) Dr. Goodman and Dr. McShane   Medication changes reported     No   Fall or balance concerns reported    No   Warm-up and Cool-down Performed on first and last piece of equipment   Resistance Training Performed Yes   VAD Patient? No     Pain Assessment   Currently in Pain? No/denies   Multiple Pain Sites No         Goals Met:  Proper associated with RPD/PD & O2 Sat Independence with exercise equipment Exercise tolerated well Strength training completed today  Goals Unmet:  Not Applicable  Comments: Patient completed exercise prescription and all exercise goals during rehab session. The exercise was tolerated well and the patient is progressing in the program.      Dr. Mark Miller is Medical Director for [facility] and [facility]. 

## 2016-05-26 ENCOUNTER — Encounter: Payer: Medicare Other | Admitting: *Deleted

## 2016-05-26 DIAGNOSIS — J449 Chronic obstructive pulmonary disease, unspecified: Secondary | ICD-10-CM

## 2016-05-26 NOTE — Progress Notes (Addendum)
Daily Session Note  Patient Details  Name: Heidi [name] MRN: [id number] Date of Birth: 13-Oct-1956 Referring Provider:   Arn Medal Row Documentation from 04/10/2016 in [facility] Cardiac and Pulmonary Rehab  Referring Provider  Dr. Simonne Maffucci      Encounter Date: 05/26/2016  Check In:     Session Check In - 05/26/16 1209      Check-In   Location [facility] & [facility]monary Re[facility]   Staff Present Gerlene Burdock, RN, BSN;Stacey Blanch Media, RRT, RCP, Respiratory Dareen Piano, BA, ACSM CEP, Exercise Physiologist   Supervising physician immediately available to respond to emergencies [facility] immediately available ER MD   Physician(s) Dr. Clearnce Hasten and Dr. Edd Fabian   Medication changes reported     No   Fall or balance concerns reported    No   Warm-up and Cool-down Performed on first and last piece of equipment   Resistance Training Performed Yes   VAD Patient? No     Pain Assessment   Currently in Pain? No/denies         Goals Met:  Proper associated with RPD/PD & O2 Sat  Goals Unmet:  Not Applicable  Comments:       [place] Name 02/15/16 1435 02/15/16 1447 05/26/16 1329     6 Minute Walk   Phase Initial  - Discharge   Distance 1070 feet  - 1070 feet   Distance % Change  -  - 6 %   Walk Time 6 minutes  - 6 minutes   # of Rest Breaks  -  - 0   MPH 2  - 2.15   METS  -  - 4.12   RPE 12  - 13.5   Perceived Dyspnea   -  - 4   VO2 Peak  -  - 14.42   Symptoms  -  - No   Resting HR 88 bpm  - 95 bpm   Resting BP 118/68  - 126/70   Max Ex. HR 91 bpm  - 146 bpm   Max Ex. BP 122/72  - 136/60   2 Minute Post BP  -  - 136/66     Interval HR   Baseline HR  -  - 97   1 Minute HR  -  - 101   5 Minute HR  -  - 143   6 Minute HR  -  - 146   2 Minute Post HR  -  - 102   Interval Heart Rate?  -  - Yes     Interval Oxygen   Interval Oxygen?  - Yes Yes   Baseline Oxygen Saturation %  - 95 % 95 %   Baseline Liters of Oxygen  -  - 0 L  Room Air   1  Minute Oxygen Saturation %  -  - 94 %   1 Minute Liters of Oxygen  -  - 0 L   2 Minute Liters of Oxygen  -  - 0 L   3 Minute Liters of Oxygen  -  - 0 L   4 Minute Oxygen Saturation %  -  - 91 %   4 Minute Liters of Oxygen  -  - 0 L   5 Minute Oxygen Saturation %  -  - 87 %   5 Minute Liters of Oxygen  -  - 0 L   6 Minute Oxygen Saturation %  - 95 % 91 %   6 Minute  Liters of Oxygen  -  - 0 L   2 Minute Post Oxygen Saturation %  - 95 % 97 %   2 Minute Post Liters of Oxygen  -  - 0 L       Dr. Emily Filbert is Medical Director for [place] and [facility].

## 2016-05-29 ENCOUNTER — Encounter: Payer: Medicare Other | Admitting: *Deleted

## 2016-05-29 DIAGNOSIS — J449 Chronic obstructive pulmonary disease, unspecified: Secondary | ICD-10-CM | POA: Diagnosis not present

## 2016-05-29 NOTE — Patient Instructions (Signed)
Discharge Instructions  Patient Details  Name: Heidi [name] MRN: [id number] Date of Birth: 1955-12-08 Referring Provider:  Lupita Leash, MD   Number of Visits: 3  Reason for Discharge:  Patient reached a stable level of exercise. Patient independent in their exercise.  Smoking History:  History  Smoking Status  . Former Smoker  . Packs/day: 1.00  . Years: 30.00  . Types: Cigarettes  . Quit date: 01/21/2012  Smokeless Tobacco  . Never Used    Diagnosis:  COPD, moderate (HCC)  Initial Exercise Prescription:     Initial Exercise Prescription - 04/10/16 1400      Date of Initial Exercise RX and Referring Provider   Referring Provider Dr. Max Fickle      Discharge Exercise Prescription (Final Exercise Prescription Changes):     Exercise Prescription Changes - 05/29/16 1200      Exercise Review   Progression Yes     Response to Exercise   Blood Pressure (Admit) 122/68   Blood Pressure (Exercise) 140/84   Heart Rate (Admit) 88 bpm   Heart Rate (Exercise) 113 bpm   Oxygen Saturation (Admit) 97 %   Oxygen Saturation (Exercise) 94 %   Rating of Perceived Exertion (Exercise) 13   Perceived Dyspnea (Exercise) 3   Duration Progress to 45 minutes of aerobic exercise without signs/symptoms of physical distress   Intensity THRR unchanged  115-146     Progression   Progression Continue to progress workloads to maintain intensity without signs/symptoms of physical distress.   Average METs 5     Resistance Training   Training Prescription Yes   Weight 3   Reps 10-15     Treadmill   MPH 1.8   Grade 0   Minutes 15     NuStep   Level 3   Minutes 15   METs 4     REL-XR   Level 4   Minutes 15     Home Exercise Plan   Plans to continue exercise at [employer]  After graduation, coming to [facility] class.       Functional Capacity:     6 Minute Walk    [place] Name 02/15/16 1435 02/15/16 1447 05/26/16 1329     6 Minute Walk   Phase Initial  -  Discharge   Distance 1070 feet  - 1070 feet   Distance % Change  -  - 6 %   Walk Time 6 minutes  - 6 minutes   # of Rest Breaks  -  - 0   MPH 2  - 2.15   METS  -  - 4.12   RPE 12  - 13.5   Perceived Dyspnea   -  - 4   VO2 Peak  -  - 14.42   Symptoms  -  - No   Resting HR 88 bpm  - 95 bpm   Resting BP 118/68  - 126/70   Max Ex. HR 91 bpm  - 146 bpm   Max Ex. BP 122/72  - 136/60   2 Minute Post BP  -  - 136/66     Interval HR   Baseline HR  -  - 97   1 Minute HR  -  - 101   5 Minute HR  -  - 143   6 Minute HR  -  - 146   2 Minute Post HR  -  - 102   Interval Heart Rate?  -  - Yes  Interval Oxygen   Interval Oxygen?  - Yes Yes   Baseline Oxygen Saturation %  - 95 % 95 %   Baseline Liters of Oxygen  -  - 0 L  Room Air   1 Minute Oxygen Saturation %  -  - 94 %   1 Minute Liters of Oxygen  -  - 0 L   2 Minute Liters of Oxygen  -  - 0 L   3 Minute Liters of Oxygen  -  - 0 L   4 Minute Oxygen Saturation %  -  - 91 %   4 Minute Liters of Oxygen  -  - 0 L   5 Minute Oxygen Saturation %  -  - 87 %   5 Minute Liters of Oxygen  -  - 0 L   6 Minute Oxygen Saturation %  - 95 % 91 %   6 Minute Liters of Oxygen  -  - 0 L   2 Minute Post Oxygen Saturation %  - 95 % 97 %   2 Minute Post Liters of Oxygen  -  - 0 L      Quality of Life:     Quality of Life - 02/15/16 1435      Quality of Life Scores   Health/Function Pre 18.5 %   Socioeconomic Pre 23.29 %   Psych/Spiritual Pre 22.86 %   Family Pre 28.8 %   GLOBAL Pre 21.8 %      Personal Goals: Goals established at orientation with interventions provided to work toward goal.     Personal Goals and Risk Factors at Admission - 02/15/16 1330      Core Components/Risk Factors/Patient Goals on Admission    Weight Management Yes;Weight Loss   Intervention Weight Management: Develop a combined nutrition and exercise program designed to reach desired caloric intake, while maintaining appropriate intake of nutrient and fiber,  sodium and fats, and appropriate energy expenditure required for the weight goal.;Weight Management: Provide education and appropriate resources to help participant work on and attain dietary goals.;Weight Management/Obesity: Establish reasonable short term and long term weight goals.   Admit Weight 125 lb (56.7 kg)   Goal Weight: Short Term 120 lb (54.4 kg)   Goal Weight: Long Term 115 lb (52.2 kg)   Expected Outcomes Short Term: Continue to assess and modify interventions until short term weight is achieved;Long Term: Adherence to nutrition and physical activity/exercise program aimed toward attainment of established weight goal;Weight Maintenance: Understanding of the daily nutrition guidelines, which includes 25-35% calories from fat, 7% or less cal from saturated fats, less than  cholesterol, less than 1.5gm of sodium, & 5 or more servings of fruits and vegetables daily;Weight Loss: Understanding of general recommendations for a balanced deficit meal plan, which promotes 1-2 lb weight loss per week and includes a negative energy balance of [phone] kcal/d;Understanding recommendations for meals to include 15-35% energy as protein, 25-35% energy from fat, 35-60% energy from carbohydrates, less than  of dietary cholesterol, 20-35 gm of total fiber daily;Understanding of distribution of calorie intake throughout the day with the consumption of 4-5 meals/snacks   Sedentary Yes   Intervention Provide advice, education, support and counseling about physical activity/exercise needs.;Develop an individualized exercise prescription for aerobic and resistive training based on initial evaluation findings, risk stratification, comorbidities and participant's personal goals.   Expected Outcomes Achievement of increased cardiorespiratory fitness and enhanced flexibility, muscular endurance and strength shown through measurements of functional capacity and personal statement  of participant.   Increase  Strength and Stamina Yes   Intervention Provide advice, education, support and counseling about physical activity/exercise needs.;Develop an individualized exercise prescription for aerobic and resistive training based on initial evaluation findings, risk stratification, comorbidities and participant's personal goals.   Expected Outcomes Achievement of increased cardiorespiratory fitness and enhanced flexibility, muscular endurance and strength shown through measurements of functional capacity and personal statement of participant.   Improve shortness of breath with ADL's Yes   Intervention Provide education, individualized exercise plan and daily activity instruction to help decrease symptoms of SOB with activities of daily living.   Expected Outcomes Short Term: Achieves a reduction of symptoms when performing activities of daily living.   Develop more efficient breathing techniques such as purse lipped breathing and diaphragmatic breathing; and practicing self-pacing with activity Yes   Intervention Provide education, demonstration and support about specific breathing techniuqes utilized for more efficient breathing. Include techniques such as pursed lipped breathing, diaphragmatic breathing and self-pacing activity.   Expected Outcomes Short Term: Participant will be able to demonstrate and use breathing techniques as needed throughout daily activities.   Increase knowledge of respiratory medications and ability to use respiratory devices properly  Yes  MDI's Symbicort, Ventolin, Combivent; SVN Albuterol and Atrovent   Intervention Provide education and demonstration as needed of appropriate use of medications, inhalers, and oxygen therapy.   Expected Outcomes Short Term: Achieves understanding of medications use. Understands that oxygen is a medication prescribed by physician. Demonstrates appropriate use of inhaler and oxygen therapy.       Personal Goals Discharge:     Goals and Risk Factor  Review - 05/23/16 0745      Core Components/Risk Factors/Patient Goals Review   Expected Outcomes Continue to manage her COPD with regular exercise at [facility] and at home, use her MDI's daily, perform PLB and pacing to manage her shortness of breath, and continue educating herself on new information on COPD through the [facility] and other resouces.      Nutrition & Weight - Outcomes:     Pre Biometrics - 02/15/16 1438      Pre Biometrics   Height 5' 5.5" (1.664 m)   Weight 122 lb 6.4 oz (55.5 kg)   Waist Circumference 37 inches   Hip Circumference 35 inches   Waist to Hip Ratio 1.06 %   BMI (Calculated) 20.1       Nutrition:     Nutrition Therapy & Goals - 02/15/16 1330      Nutrition Therapy   Diet Ms Demeter would like to meet with the dietitian; large portion sizes, but low salt and sweets; she does like fruits and vegetables; she has cut her soda to 2 cans a day.      Nutrition Discharge:   Education Questionnaire Score:     Knowledge Questionnaire Score - 02/15/16 1330      Knowledge Questionnaire Score   Pre Score 10/10      Goals reviewed with patient; copy given to patient.

## 2016-05-29 NOTE — Progress Notes (Signed)
Pulmonary Individual Treatment Plan  Patient Details  Name: Heidi [name] MRN: [id number] Date of Birth: 03-09-1956 Referring Provider:   Arn Medal Row Documentation from 04/10/2016 in [facility] Cardiac and Pulmonary Rehab  Referring Provider  Dr. Simonne Maffucci      Initial Encounter Date:  Flowsheet Row Documentation from 04/10/2016 in [facility] Cardiac and Pulmonary Rehab  Referring Provider  Dr. Simonne Maffucci      Visit Diagnosis: COPD, moderate ([place])  Patient's Home Medications on Admission:  Current Outpatient Prescriptions:    albuterol (PROVENTIL) (2.5 MG/3ML) 0.083% nebulizer solution, USE ONE VIAL IN NEBULIZER EVERY 6 HOURS AS NEEDED FOR WHEEZING OR SHORTNESS OF BREATH, Disp: 360 mL, Rfl: 5   ALPRAZolam (XANAX) 0.25 MG tablet, Take 0.25 mg by mouth at bedtime as needed for anxiety., Disp: , Rfl:    budesonide-formoterol (SYMBICORT) 160-4.5 MCG/ACT inhaler, Inhale 2 puffs into the lungs 2 (two) times daily., Disp: 1 Inhaler, Rfl: 6   escitalopram (LEXAPRO) 5 MG tablet, Take 20 mg by mouth daily. , Disp: , Rfl:    fluticasone (FLONASE) 50 MCG/ACT nasal spray, USE TWO SPRAY(S) IN EACH NOSTRIL ONCE DAILY, Disp: 16 g, Rfl: 5   ipratropium (ATROVENT) 0.02 % nebulizer solution, Take 2.5 mLs (0.5 mg total) by nebulization every 6 (six) hours as needed for wheezing or shortness of breath. Dx Code J43.2, Disp: 360 mL, Rfl: 3   ipratropium (ATROVENT) 0.02 % nebulizer solution, INHALE ONE VIAL EVERY 6 HOURS AS NEEDED FOR WHEEZING OR SHORTNESS OF BREATH, Disp: 360 mL, Rfl: 5   Ipratropium-Albuterol (COMBIVENT RESPIMAT) 20-100 MCG/ACT AERS respimat, Inhale 1 puff into the lungs every 6 (six) hours as needed for wheezing., Disp: 1 Inhaler, Rfl: 5   pantoprazole (PROTONIX) 40 MG tablet, Take 40 mg by mouth 2 (two) times daily. Take 30-60 min before first meal of the day, Disp: , Rfl:   Past Medical History: Past Medical History:  Diagnosis Date   COPD (chronic obstructive pulmonary  disease) (HCC)    GERD (gastroesophageal reflux disease)    Hypotension    limiting med titration   NSTEMI (non-ST elevated myocardial infarction) ([place])    12/2011 with normal coronaries possibly secondary to Takotsubo (EF 30-35% by echo), occurred following two episodes of acute respiratory distress   Takotsubo syndrome 4/13    Tobacco Use: History  Smoking Status   Former Smoker   Packs/day: 1.00   Years: 30.00   Types: Cigarettes   Quit date: 01/21/2012  Smokeless Tobacco   Never Used    Labs: Recent Review Flowsheet Data    Labs for ITP Cardiac and Pulmonary Rehab Latest Ref Rng & Units 01/23/2012 01/24/2012 02/01/2012 08/08/2012 08/08/2012   Cholestrol 0 - 200 mg/dL - 134 - - -   LDLCALC 0 - 99 mg/dL - 41 - - -   HDL >39 mg/dL - 80 - - -   Trlycerides <150 mg/dL - 65 - - -   Hemoglobin A1c <5.7 % 5.6 - - - -   PHART 7.350 - 7.450 7.391 - 7.409(H) 7.206(L) 7.332(L)   PCO2ART 35.0 - 45.0 mmHg 38.4 - 39.4 46.5(H) 47.3(H)   HCO3 20.0 - 24.0 mEq/L 23.3 - 24.4(H) 18.4(L) 25.1(H)   TCO2 0 - 100 mmol/L 24 - 21._0 ACIDBASEDEF 0.0 - 2.0 mmol/L 1.0 - - 9.0(H) 1.0   O2SAT % 98.0 - 94.1 100.0 99.0       ADL UCSD:     Pulmonary Assessment Scores    Row  Name 02/15/16 1330         ADL UCSD   ADL Phase Entry     SOB Score total 55     Rest 0     Walk 1     Stairs 3     Bath 1     Dress 1     Shop 2        Pulmonary Function Assessment:     Pulmonary Function Assessment - 02/15/16 1420      Pulmonary Function Tests   RV% 178 %   DLCO% 48 %     Initial Spirometry Results   FVC% 45 %   FEV1% 27 %   FEV1/FVC Ratio 45     Post Bronchodilator Spirometry Results   FVC% 63 %   FEV1% 32 %   FEV1/FVC Ratio 38     Breath   Bilateral Breath Sounds Clear;Decreased   Shortness of Breath Yes;Fear of Shortness of Breath;Limiting activity      Exercise Target Goals:    Exercise Program Goal: Individual exercise prescription set with THRR, safety &  activity barriers. Participant demonstrates ability to understand and report RPE using BORG scale, to self-measure pulse accurately, and to acknowledge the importance of the exercise prescription.  Exercise Prescription Goal: Starting with aerobic activity 30 plus minutes a day, 3 days per week for initial exercise prescription. Provide home exercise prescription and guidelines that participant acknowledges understanding prior to discharge.  Activity Barriers & Risk Stratification:     Activity Barriers & Cardiac Risk Stratification - 02/15/16 1330      Activity Barriers & Cardiac Risk Stratification   Activity Barriers Shortness of Breath;Deconditioning   Cardiac Risk Stratification Moderate      6 Minute Walk:     6 Minute Walk    Row Name 02/15/16 1435 02/15/16 1447 05/26/16 1329     6 Minute Walk   Phase Initial  -- Discharge   Distance 1070 feet  -- 1070 feet   Distance % Change  --  -- 6 %   Walk Time 6 minutes  -- 6 minutes   # of Rest Breaks  --  -- 0   MPH 2  -- 2.15   METS  --  -- 4.12   RPE 12  -- 13.5   Perceived Dyspnea   --  -- 4   VO2 Peak  --  -- 14.42   Symptoms  --  -- No   Resting HR 88 bpm  -- 95 bpm   Resting BP 118/68  -- 126/70   Max Ex. HR 91 bpm  -- 146 bpm   Max Ex. BP 122/72  -- 136/60   2 Minute Post BP  --  -- 136/66     Interval HR   Baseline HR  --  -- 97   1 Minute HR  --  -- 101   5 Minute HR  --  -- 143   6 Minute HR  --  -- 146   2 Minute Post HR  --  -- 102   Interval Heart Rate?  --  -- Yes     Interval Oxygen   Interval Oxygen?  -- Yes Yes   Baseline Oxygen Saturation %  -- 95 % 95 %   Baseline Liters of Oxygen  --  -- 0 L  Room Air   1 Minute Oxygen Saturation %  --  -- 94 %   1 Minute Liters of Oxygen  --  --  0 L   2 Minute Liters of Oxygen  --  -- 0 L   3 Minute Liters of Oxygen  --  -- 0 L   4 Minute Oxygen Saturation %  --  -- 91 %   4 Minute Liters of Oxygen  --  -- 0 L   5 Minute Oxygen Saturation %  --  -- 87 %    5 Minute Liters of Oxygen  --  -- 0 L   6 Minute Oxygen Saturation %  -- 95 % 91 %   6 Minute Liters of Oxygen  --  -- 0 L   2 Minute Post Oxygen Saturation %  -- 95 % 97 %   2 Minute Post Liters of Oxygen  --  -- 0 L      Initial Exercise Prescription:     Initial Exercise Prescription - 04/10/16 1400      Date of Initial Exercise RX and Referring Provider   Referring Provider Dr. Simonne Maffucci      Perform Capillary Blood Glucose checks as needed.  Exercise Prescription Changes:     Exercise Prescription Changes    Row Name 02/22/16 0700 02/28/16 1400 03/08/16 1200 04/03/16 1300 04/12/16 1200     Exercise Review   Progression  -- Yes Yes Yes Yes     Response to Exercise   Blood Pressure (Admit) 130/80 118/78 118/78 122/62  --   Blood Pressure (Exercise) 138/78 148/70 148/70 138/84  --   Blood Pressure (Exit) 114/64 124/66 124/66 112/60  --   Heart Rate (Admit) 99 bpm 78 bpm 78 bpm 80 bpm  --   Heart Rate (Exercise) 98 bpm 114 bpm 114 bpm 114 bpm  --   Heart Rate (Exit) 74 bpm 87 bpm 87 bpm 85 bpm  --   Oxygen Saturation (Admit) 94 % 95 % 95 % 98 %  --   Oxygen Saturation (Exercise) 92 % 96 % 96 % 95 %  --   Oxygen Saturation (Exit) 97 % 95 % 95 % 98 %  --   Rating of Perceived Exertion (Exercise) _0 --   Perceived Dyspnea (Exercise) 3 2.5 2.5 3  --   Symptoms  --  --  --  -- none   Comments data entry for 02/21/2016 - Ms Mccallister's first day of exercise in [employer].   --  --  -- Home Exercise Given 04/12/16   Duration  -- Progress to 50 minutes of aerobic without signs/symptoms of physical distress Progress to 50 minutes of aerobic without signs/symptoms of physical distress Progress to 50 minutes of aerobic without signs/symptoms of physical distress Progress to 50 minutes of aerobic without signs/symptoms of physical distress   Intensity  -- Rest + 30 Rest + 30 THRR New THRR New     Progression   Progression  --  --  -- --  115-146 --  115-146      Resistance Training   Training Prescription _1    Weight _2 Reps 10-12 10-12 10-12 10-12 10-12     Treadmill   MPH 1.5 1.5 1._3 Grade 0 0 0 0 0   Minutes _4 10/5 15  10/5     NuStep   Level _5 Watts 40 40 40 60 60   Minutes _6 15  REL-XR   Level  --  -- _0 Watts  --  -- 60 60 60     Biostep-RELP   Level _1 Watts _2 Minutes _3 Home Exercise Plan   Plans to continue exercise at  --  --  --  -- Home  walking and rower   Frequency  --  --  --  -- Add 2 additional days to program exercise sessions.   Row Name 04/18/16 1600 05/17/16 1300 05/29/16 1200         Exercise Review   Progression Yes Yes Yes       Response to Exercise   Blood Pressure (Admit) 110/60 134/60 122/68     Blood Pressure (Exercise) 148/80 116/60 140/84     Blood Pressure (Exit) 116/60 108/60  --     Heart Rate (Admit) 96 bpm 91 bpm 88 bpm     Heart Rate (Exercise) 112 bpm 108 bpm 113 bpm     Heart Rate (Exit) 98 bpm 98 bpm  --     Oxygen Saturation (Admit) 95 % 96 % 97 %     Oxygen Saturation (Exercise) 91 % 90 % 94 %     Oxygen Saturation (Exit) 97 % 97 %  --     Rating of Perceived Exertion (Exercise) _4 Perceived Dyspnea (Exercise)  --  -- 3     Duration  -- Progress to 45 minutes of aerobic exercise without signs/symptoms of physical distress Progress to 45 minutes of aerobic exercise without signs/symptoms of physical distress     Intensity THRR unchanged THRR unchanged THRR unchanged  115-146       Progression   Progression  -- Continue to progress workloads to maintain intensity without signs/symptoms of physical distress. Continue to progress workloads to maintain intensity without signs/symptoms of physical distress.     Average METs  --  -- 5       Resistance Training   Training Prescription Yes Yes Yes     Weight _5 Reps 10-12 10-15 10-15       Treadmill   MPH  2 1.7 1.8     Grade 0 0 0     Minutes _6 NuStep   Level 3  -- 3     Watts 40  --  --     Minutes 15  -- 15     METs  --  -- 4       REL-XR   Level _7 Watts 60  --  --     Minutes  -- 15 15       Home Exercise Plan   Plans to continue exercise at  --  -- [employer]  After graduation, coming to FF class.         Exercise Comments:     Exercise Comments    Row Name 03/22/16 1217 04/03/16 1535 04/12/16 1251 04/18/16 1606 05/03/16 1340   Exercise Comments Pt plans to make more progress in program to work to 15 min continuous on all equipment before adding in 2nd day of home exercise. Ms Chirino will start the 1:15 FF class when she has completed 18 sessions of [employer]. Reviewed home exercise with pt today.  Pt plans to walk and use stationary rower at home for exercise.  We discussed her progression of exercise.  Reviewed THR, pulse, RPE, sign and symptoms, and when to call 911 or MD.  Also discussed weather considerations and indoor options.  Pt voiced understanding. Ms Kubly is progessing well with exercise  Cynthias last visit was 04/12/16   Row Name 05/17/16 1407 05/26/16 1331         Exercise Comments Today was Cynthias first day back since 04/12/16. Jenny Reichmann completed her 6 min walk test today.  She improved by 65 ft.  She is planning to graduate on Monday and join our [employer] starting Wed. Aug 2.         Discharge Exercise Prescription (Final Exercise Prescription Changes):     Exercise Prescription Changes - 05/29/16 1200      Exercise Review   Progression Yes     Response to Exercise   Blood Pressure (Admit) 122/68   Blood Pressure (Exercise) 140/84   Heart Rate (Admit) 88 bpm   Heart Rate (Exercise) 113 bpm   Oxygen Saturation (Admit) 97 %   Oxygen Saturation (Exercise) 94 %   Rating of Perceived Exertion (Exercise) 13   Perceived Dyspnea (Exercise) 3   Duration Progress to 45 minutes of aerobic exercise without signs/symptoms of  physical distress   Intensity THRR unchanged  115-146     Progression   Progression Continue to progress workloads to maintain intensity without signs/symptoms of physical distress.   Average METs 5     Resistance Training   Training Prescription Yes   Weight 3   Reps 10-15     Treadmill   MPH 1.8   Grade 0   Minutes 15     NuStep   Level 3   Minutes 15   METs 4     REL-XR   Level 4   Minutes 15     Home Exercise Plan   Plans to continue exercise at [employer]  After graduation, coming to FF class.        Nutrition:  Target Goals: Understanding of nutrition guidelines, daily intake of sodium <1585m, cholesterol <2045m calories 30% from fat and 7% or less from saturated fats, daily to have 5 or more servings of fruits and vegetables.  Biometrics:     Pre Biometrics - 02/15/16 1438      Pre Biometrics   Height 5' 5.5" (1.664 m)   Weight 122 lb 6.4 oz (55.5 kg)   Waist Circumference 37 inches   Hip Circumference 35 inches   Waist to Hip Ratio 1.06 %   BMI (Calculated) 20.1       Nutrition Therapy Plan and Nutrition Goals:     Nutrition Therapy & Goals - 02/15/16 1330      Nutrition Therapy   Diet Ms DoSwineyould like to meet with the dietitian; large portion sizes, but low salt and sweets; she does like fruits and vegetables; she has cut her soda to 2 cans a day.      Nutrition Discharge: Rate Your Plate Scores:   Psychosocial: Target Goals: Acknowledge presence or absence of depression, maximize coping skills, provide positive support system. Participant is able to verbalize types and ability to use techniques and skills needed for reducing stress and depression.  Initial Review & Psychosocial Screening:     Initial Psych Review & Screening - 02/15/16 1330      Family Dynamics   Good Support System? Yes  Comments Ms Saha has good support from her boyfriend and daughter. Her hospital admission for COPD exacerbation in March was quite  serious and is a concern for her to manage her COPD.     Barriers   Psychosocial barriers to participate in program The patient should benefit from training in stress management and relaxation.     Screening Interventions   Interventions Encouraged to exercise;Program counselor consult      Quality of Life Scores:     Quality of Life - 02/15/16 1435      Quality of Life Scores   Health/Function Pre 18.5 %   Socioeconomic Pre 23.29 %   Psych/Spiritual Pre 22.86 %   Family Pre 28.8 %   GLOBAL Pre 21.8 %      PHQ-9: Recent Review Flowsheet Data    Depression screen [facility] 2/9 05/26/2016 02/15/2016   Decreased Interest 0 0   Down, Depressed, Hopeless 1 0   PHQ - 2 Score 1 0   Altered sleeping 3 1   Tired, decreased energy 1 1   Change in appetite 0 1   Feeling bad or failure about yourself  0 0   Trouble concentrating 0 0   Moving slowly or fidgety/restless 0 0   Suicidal thoughts 0 0   PHQ-9 Score 5 3   Difficult doing work/chores Somewhat difficult Not difficult at all      Psychosocial Evaluation and Intervention:     Psychosocial Evaluation - 05/29/16 1228      Discharge Psychosocial Assessment & Intervention   Comments Discharge meeting with Ms. Flegal today as she is completing this program.  She reports her energy levels and her appetite have improved since beginning this program.  She states her mood is positive most of the time.  She does have a new stressor with her elderly father in hospital and not doing well, but she is handling that well with cognitive approaches.  Ms. Maultsby states she will be returning to the [employer] program [facility] at [facility] to maintain her progress made.  Counselor commended her for all her hard work and consistency in exercising.        Psychosocial Re-Evaluation:     Psychosocial Re-Evaluation    [place] Name 03/15/16 1257 04/12/16 1232           Psychosocial Re-Evaluation   Comments Counselor follow up with Ms. flatley today  reporting she has been experiencing "tremors" more lately and had a negative reaction to the medication her PCP gave her.  She asked for information on local psychiatrists as she thinks she would benefit from a medication evaluation.  Counselor assessed her for depression and anxiety with ms. Dante reporting her anxiety levels are high and although exercise helps decrease this, it is only temporary.  Counselor provided information on psychiatrists for her to consider to help with this.  Counselor will continue to follow with Ms. Cargo during this course of this program.   Counselor follow up with Ms. Heenan today stating her mood is stable and the tremors have d/c.  She states an increase in energy and strength since coming into this program and that she "definitely feels better."        Education: Education Goals: Education classes will be provided on a weekly basis, covering required topics. Participant will state understanding/return demonstration of topics presented.  Learning Barriers/Preferences:     Learning Barriers/Preferences - 02/15/16 1330      Learning Barriers/Preferences   Learning Barriers None  Learning Preferences Group Instruction;Individual Instruction;Pictoral;Skilled Demonstration;Verbal Instruction;Video;Written Material      Education Topics: Initial Evaluation Education: - Verbal, written and demonstration of respiratory meds, RPE/PD scales, oximetry and breathing techniques. Instruction on use of nebulizers and MDIs: cleaning and proper use, rinsing mouth with steroid doses and importance of monitoring MDI activations. Flowsheet Row Pulmonary Rehab from 02/25/2016 in [facility] Cardiac and Pulmonary Rehab  Date  02/15/16  Educator  LB  Instruction Review Code  2- meets goals/outcomes      General Nutrition Guidelines/Fats and Fiber: -Group instruction provided by verbal, written material, models and posters to present the general guidelines for heart healthy  nutrition. Gives an explanation and review of dietary fats and fiber. Flowsheet Row Pulmonary Rehab from 05/29/2016 in [facility] Cardiac and Pulmonary Rehab  Date  05/22/16 Orinda Kenner attended on 04/03/16]  Educator  CR  Instruction Review Code  2- meets goals/outcomes      Controlling Sodium/Reading Food Labels: -Group verbal and written material supporting the discussion of sodium use in heart healthy nutrition. Review and explanation with models, verbal and written materials for utilization of the food label. Flowsheet Row Pulmonary Rehab from 05/29/2016 in [facility] Cardiac and Pulmonary Rehab  Date  05/29/16  Educator  CR  Instruction Review Code  2- meets goals/outcomes      Exercise Physiology & Risk Factors: - Group verbal and written instruction with models to review the exercise physiology of the cardiovascular system and associated critical values. Details cardiovascular disease risk factors and the goals associated with each risk factor. Flowsheet Row Pulmonary Rehab from 05/29/2016 in [facility] Cardiac and Pulmonary Rehab  Date  05/17/16  Educator  Fabio Pierce and Nobie Putnam  Instruction Review Code  2- meets goals/outcomes      Aerobic Exercise & Resistance Training: - Gives group verbal and written discussion on the health impact of inactivity. On the components of aerobic and resistive training programs and the benefits of this training and how to safely progress through these programs.   Flexibility, Balance, General Exercise Guidelines: - Provides group verbal and written instruction on the benefits of flexibility and balance training programs. Provides general exercise guidelines with specific guidelines to those with heart or lung disease. Demonstration and skill practice provided. Flowsheet Row Pulmonary Rehab from 05/29/2016 in [facility] Cardiac and Pulmonary Rehab  Date  03/08/16  Educator  Rhett Bannister, PT  Instruction Review Code  2- meets goals/outcomes      Stress  Management: - Provides group verbal and written instruction about the health risks of elevated stress, cause of high stress, and healthy ways to reduce stress. Flowsheet Row Pulmonary Rehab from 05/29/2016 in [facility] Cardiac and Pulmonary Rehab  Date  03/15/16  Educator  [facility]  Instruction Review Code  2- meets goals/outcomes      Depression: - Provides group verbal and written instruction on the correlation between heart/lung disease and depressed mood, treatment options, and the stigmas associated with seeking treatment. Flowsheet Row Pulmonary Rehab from 05/29/2016 in [facility] Cardiac and Pulmonary Rehab  Date  04/12/16  Educator  Jeannetta Ellis, [facility]  Instruction Review Code  2- meets goals/outcomes      Exercise & Equipment Safety: - Individual verbal instruction and demonstration of equipment use and safety with use of the equipment. Flowsheet Row Pulmonary Rehab from 05/29/2016 in [facility] Cardiac and Pulmonary Rehab  Date  02/28/16  Educator  C. EnterkinRN  Instruction Review Code  2- meets goals/outcomes      Infection Prevention: - Provides verbal  and written material to individual with discussion of infection control including proper hand washing and proper equipment cleaning during exercise session.   Falls Prevention: - Provides verbal and written material to individual with discussion of falls prevention and safety. Flowsheet Row Pulmonary Rehab from 02/25/2016 in [facility] Cardiac and Pulmonary Rehab  Date  02/15/16  Educator  LB  Instruction Review Code  2- meets goals/outcomes      Diabetes: - Individual verbal and written instruction to review signs/symptoms of diabetes, desired ranges of glucose level fasting, after meals and with exercise. Advice that pre and post exercise glucose checks will be done for 3 sessions at entry of program.   Chronic Lung Diseases: - Group verbal and written instruction to review new updates, new respiratory medications, new advancements in  procedures and treatments. Provide informative websites and "800" numbers of self-education. Flowsheet Row Pulmonary Rehab from 05/29/2016 in [facility] Cardiac and Pulmonary Rehab  Date  03/06/16  Educator  L. Brown,RT  Instruction Review Code  2- meets goals/outcomes      Lung Procedures: - Group verbal and written instruction to describe testing methods done to diagnose lung disease. Review the outcome of test results. Describe the treatment choices: Pulmonary Function Tests, ABGs and oximetry. Flowsheet Row Pulmonary Rehab from 05/29/2016 in [facility] Cardiac and Pulmonary Rehab  Date  05/19/16  Educator  [place]  Instruction Review Code  2- meets goals/outcomes      Energy Conservation: - Provide group verbal and written instruction for methods to conserve energy, plan and organize activities. Instruct on pacing techniques, use of adaptive equipment and posture/positioning to relieve shortness of breath.   Triggers: - Group verbal and written instruction to review types of environmental controls: home humidity, furnaces, filters, dust mite/pet prevention, HEPA vacuums. To discuss weather changes, air quality and the benefits of nasal washing. Flowsheet Row Pulmonary Rehab from 02/25/2016 in [facility] Cardiac and Pulmonary Rehab  Date  02/21/16  Educator  LB  Instruction Review Code  2- meets goals/outcomes      Exacerbations: - Group verbal and written instruction to provide: warning signs, infection symptoms, calling MD promptly, preventive modes, and value of vaccinations. Review: effective airway clearance, coughing and/or vibration techniques. Create an Sports administrator.   Oxygen: - Individual and group verbal and written instruction on oxygen therapy. Includes supplement oxygen, available portable oxygen systems, continuous and intermittent flow rates, oxygen safety, concentrators, and Medicare reimbursement for oxygen.   Respiratory Medications: - Group verbal and written instruction to review  medications for lung disease. Drug class, frequency, complications, importance of spacers, rinsing mouth after steroid MDI's, and proper cleaning methods for nebulizers. Flowsheet Row Pulmonary Rehab from 02/25/2016 in [facility] Cardiac and Pulmonary Rehab  Date  02/15/16  Educator  LB  Instruction Review Code  2- meets goals/outcomes      AED/CPR: - Group verbal and written instruction with the use of models to demonstrate the basic use of the AED with the basic ABC's of resuscitation.   Breathing Retraining: - Provides individuals verbal and written instruction on purpose, frequency, and proper technique of diaphragmatic breathing and pursed-lipped breathing. Applies individual practice skills. Flowsheet Row Pulmonary Rehab from 02/25/2016 in [facility] Cardiac and Pulmonary Rehab  Date  02/15/16  Educator  LB  Instruction Review Code  2- meets goals/outcomes      Anatomy and Physiology of the Lungs: - Group verbal and written instruction with the use of models to provide basic lung anatomy and physiology related to function, structure and  complications of lung disease.   Heart Failure: - Group verbal and written instruction on the basics of heart failure: signs/symptoms, treatments, explanation of ejection fraction, enlarged heart and cardiomyopathy. Flowsheet Row Pulmonary Rehab from 02/25/2016 in [facility]  Date  02/25/16  Educator  [place]  Instruction Review Code  2- meets goals/outcomes      Sleep Apnea: - Individual verbal and written instruction to review Obstructive Sleep Apnea. Review of risk factors, methods for diagnosing and types of masks and machines for OSA.   Anxiety: - Provides group, verbal and written instruction on the correlation between heart/lung disease and anxiety, treatment options, and management of anxiety.   Relaxation: - Provides group, verbal and written instruction about the benefits of relaxation for patients with heart/lung disease.  Also provides patients with examples of relaxation techniques. Flowsheet Row Pulmonary Rehab from 05/29/2016 in [facility]  Date  03/29/16  Educator  [facility]  Instruction Review Code  2- Meets goals/outcomes      Knowledge Questionnaire Score:     Knowledge Questionnaire Score - 02/15/16 1330      Knowledge Questionnaire Score   Pre Score 10/10       Core Components/Risk Factors/Patient Goals at Admission:     Personal Goals and Risk Factors at Admission - 02/15/16 1330      Core Components/Risk Factors/Patient Goals on Admission    Weight Management Yes;Weight Loss   Intervention Weight Management: Develop a combined nutrition and exercise program designed to reach desired caloric intake, while maintaining appropriate intake of nutrient and fiber, sodium and fats, and appropriate energy expenditure required for the weight goal.;Weight Management: Provide education and appropriate resources to help participant work on and attain dietary goals.;Weight Management/Obesity: Establish reasonable short term and long term weight goals.   Admit Weight 125 lb (56.7 kg)   Goal Weight: Short Term 120 lb (54.4 kg)   Goal Weight: Long Term 115 lb (52.2 kg)   Expected Outcomes Short Term: Continue to assess and modify interventions until short term weight is achieved;Long Term: Adherence to nutrition and physical activity/exercise program aimed toward attainment of established weight goal;Weight Maintenance: Understanding of the daily nutrition guidelines, which includes 25-35% calories from fat, 7% or less cal from saturated fats, less than '200mg'$  cholesterol, less than 1.5gm of sodium, & 5 or more servings of fruits and vegetables daily;Weight Loss: Understanding of general recommendations for a balanced deficit meal plan, which promotes 1-2 lb weight loss per week and includes a negative energy balance of [phone] kcal/d;Understanding recommendations for meals to include 15-35%  energy as protein, 25-35% energy from fat, 35-60% energy from carbohydrates, less than '200mg'$  of dietary cholesterol, 20-35 gm of total fiber daily;Understanding of distribution of calorie intake throughout the day with the consumption of 4-5 meals/snacks   Sedentary Yes   Intervention Provide advice, education, support and counseling about physical activity/exercise needs.;Develop an individualized exercise prescription for aerobic and resistive training based on initial evaluation findings, risk stratification, comorbidities and participant's personal goals.   Expected Outcomes Achievement of increased cardiorespiratory fitness and enhanced flexibility, muscular endurance and strength shown through measurements of functional capacity and personal statement of participant.   Increase Strength and Stamina Yes   Intervention Provide advice, education, support and counseling about physical activity/exercise needs.;Develop an individualized exercise prescription for aerobic and resistive training based on initial evaluation findings, risk stratification, comorbidities and participant's personal goals.   Expected Outcomes Achievement of increased cardiorespiratory fitness and enhanced flexibility,  muscular endurance and strength shown through measurements of functional capacity and personal statement of participant.   Improve shortness of breath with ADL's Yes   Intervention Provide education, individualized exercise plan and daily activity instruction to help decrease symptoms of SOB with activities of daily living.   Expected Outcomes Short Term: Achieves a reduction of symptoms when performing activities of daily living.   Develop more efficient breathing techniques such as purse lipped breathing and diaphragmatic breathing; and practicing self-pacing with activity Yes   Intervention Provide education, demonstration and support about specific breathing techniuqes utilized for more efficient breathing.  Include techniques such as pursed lipped breathing, diaphragmatic breathing and self-pacing activity.   Expected Outcomes Short Term: Participant will be able to demonstrate and use breathing techniques as needed throughout daily activities.   Increase knowledge of respiratory medications and ability to use respiratory devices properly  Yes  MDI's Symbicort, Ventolin, Combivent; SVN Albuterol and Atrovent   Intervention Provide education and demonstration as needed of appropriate use of medications, inhalers, and oxygen therapy.   Expected Outcomes Short Term: Achieves understanding of medications use. Understands that oxygen is a medication prescribed by physician. Demonstrates appropriate use of inhaler and oxygen therapy.      Core Components/Risk Factors/Patient Goals Review:      Goals and Risk Factor Review    Row Name 02/22/16 4624 03/06/16 1130 03/22/16 1214 04/03/16 1130 05/19/16 1340     Core Components/Risk Factors/Patient Goals Review   Personal Goals Review Develop more efficient breathing techniques such as purse lipped breathing and diaphragmatic breathing and practicing self-pacing with activity. Increase Strength and Stamina;Improve shortness of breath with ADL's;Increase knowledge of respiratory medications and ability to use respiratory devices properly. Develop more efficient breathing techniques such as purse lipped breathing and diaphragmatic breathing and practicing self-pacing with activity.;Improve shortness of breath with ADL's;Sedentary Increase knowledge of respiratory medications and ability to use respiratory devices properly. Sedentary;Increase Strength and Stamina   Review Ms Verbeek first day of exercise in [facility] - she had been in the program before and adjusted to the exercise goals and equipment set-up. Using PLB is part of her daily management of her shortness of breath with activies. She has good technique and needs no queing with her exerciae goals in  [facility].  Ms Flury has noticed an improvement in her shortness of breath since starting [facility]. She knows her limits and knows when to stop and rest. Her stamina and energy level has increased since she was so sick with the flu.  We discussed her MDI's and  is using her spacer . When she goes out for the day, Ms Guyette  will take a SVN treatment  for her breathing. Pt is exercising 1 additional day a week at home using stationary bike and weights. She has been using pursed lip breathing to improve her ADLs.  High heat days are still making in hard for her to breathe in general.  She is only needing to use her O2 at night.  She has noticed the biggest difference in her breathing on the treadmill when using pursed lip breathing.. Reviewed Ms Spadafore's MDI's - she uses her Combivent for rescue and has the Ventolin MDI as back-up. She is using her SVN everyday at least once a day and uses her Symibort as prescribed with a spacer. Arline Asp is using her rower and handweights at home on off days.  She is starting to notice the increase in strength and stamina.   Expected Outcomes Continue  using PLB daily for activity at home and with exercise goals in [place].  -- Plan to continue to see improvements and will eventually add in 2nd day at home for exercise. Continue to use MDI's and SVN as ordered and use spacer for the Combivent and Symbicort. Jenny Reichmann will continue to come to exercise and upon graduation will join [employer].   [place] Name 05/23/16 0736 05/23/16 0745           Core Components/Risk Factors/Patient Goals Review   Personal Goals Review Develop more efficient breathing techniques such as purse lipped breathing and diaphragmatic breathing and practicing self-pacing with activity.;Improve shortness of breath with ADL's;Increase knowledge of respiratory medications and ability to use respiratory devices properly.;Weight Management/Obesity;Sedentary;Increase Strength and Stamina  --      Review Ms Crownover  has maintained a consistent weight in the low 120's and understands the importance of weight management with regular exercise. We discussed the importance of lower weight in the stomach for more efficiency with the diaphragm contractions. Shehas a good understanding of her MDI's and uses her SVN as needed. She manages her shortness of breath with PLB and pacing. Her UCSD questionnaire improved by 8 points - Minimal Importance Difference for COPD is 5 points. Her exercise plan is joining [employer] and exercising 3 days/week  --      Expected Outcomes  -- Continue to manage her COPD with regular exercise at [facility] and at home, use her MDI's daily, perform PLB and pacing to manage her shortness of breath, and continue educating herself on new information on COPD through the [facility] and other resouces.         Core Components/Risk Factors/Patient Goals at Discharge (Final Review):      Goals and Risk Factor Review - 05/23/16 0745      Core Components/Risk Factors/Patient Goals Review   Expected Outcomes Continue to manage her COPD with regular exercise at [facility] and at home, use her MDI's daily, perform PLB and pacing to manage her shortness of breath, and continue educating herself on new information on COPD through the [facility] and other resouces.      ITP Comments:     ITP Comments    Row Name 05/03/16 1342 05/08/16 1620 05/22/16 1332 05/29/16 1220     ITP Comments Cynthias last visit was 04/12/16 Ms Pettibone called today and is with her father who has been admitted to the hospital with illness. She has also been with a family member who is at [facility]. Her plan is to return to [place] next week. Ms. Balbach met with Dr. Emily Filbert, program's medical director, one on one today.  Today Ms. Kaseman is finishing pulmonary rehab today. Her continuing exercise plans/discharge instructions were reviewed with her. She plans to continue to exercise in [employer] exercise classes in the same  facility.        Comments: Ms Brahmbhatt graduated from [employer] today 05/29/2016 and will continue exercise in [place] 3 days/week.

## 2016-05-29 NOTE — Progress Notes (Signed)
Discharge Summary  Patient Details  Name: Heidi [name] MRN: [id number] Date of Birth: 1956-07-14 Referring Provider:   Normajean Glasgow Row Documentation from 04/10/2016 in [facility]  Referring Provider  Dr. Max Fickle       Number of Visits: 18  Reason for Discharge:  Patient reached a stable level of exercise. Patient independent in their exercise.  Smoking History:  History  Smoking Status  . Former Smoker  . Packs/day: 1.00  . Years: 30.00  . Types: Cigarettes  . Quit date: 01/21/2012  Smokeless Tobacco  . Never Used    Diagnosis:  COPD, moderate (HCC)  ADL UCSD:     Pulmonary Assessment Scores    Row Name 02/15/16 1330         ADL UCSD   ADL Phase Entry     SOB Score total 55     Rest 0     Walk 1     Stairs 3     Bath 1     Dress 1     Shop 2        Initial Exercise Prescription:     Initial Exercise Prescription - 04/10/16 1400      Date of Initial Exercise RX and Referring Provider   Referring Provider Dr. Max Fickle      Discharge Exercise Prescription (Final Exercise Prescription Changes):     Exercise Prescription Changes - 05/29/16 1200      Exercise Review   Progression Yes     Response to Exercise   Blood Pressure (Admit) 122/68   Blood Pressure (Exercise) 140/84   Heart Rate (Admit) 88 bpm   Heart Rate (Exercise) 113 bpm   Oxygen Saturation (Admit) 97 %   Oxygen Saturation (Exercise) 94 %   Rating of Perceived Exertion (Exercise) 13   Perceived Dyspnea (Exercise) 3   Duration Progress to 45 minutes of aerobic exercise without signs/symptoms of physical distress   Intensity THRR unchanged  115-146     Progression   Progression Continue to progress workloads to maintain intensity without signs/symptoms of physical distress.   Average METs 5     Resistance Training   Training Prescription Yes   Weight 3   Reps 10-15     Treadmill   MPH 1.8   Grade 0   Minutes 15     NuStep   Level  3   Minutes 15   METs 4     REL-XR   Level 4   Minutes 15     Home Exercise Plan   Plans to continue exercise at [employer]  After graduation, coming to FF class.       Functional Capacity:     6 Minute Walk    Row Name 02/15/16 1435 02/15/16 1447 05/26/16 1329     6 Minute Walk   Phase Initial  - Discharge   Distance 1070 feet  - 1070 feet   Distance % Change  -  - 6 %   Walk Time 6 minutes  - 6 minutes   # of Rest Breaks  -  - 0   MPH 2  - 2.15   METS  -  - 4.12   RPE 12  - 13.5   Perceived Dyspnea   -  - 4   VO2 Peak  -  - 14.42   Symptoms  -  - No   Resting HR 88 bpm  - 95 bpm   Resting BP  118/68  - 126/70   Max Ex. HR 91 bpm  - 146 bpm   Max Ex. BP 122/72  - 136/60   2 Minute Post BP  -  - 136/66     Interval HR   Baseline HR  -  - 97   1 Minute HR  -  - 101   5 Minute HR  -  - 143   6 Minute HR  -  - 146   2 Minute Post HR  -  - 102   Interval Heart Rate?  -  - Yes     Interval Oxygen   Interval Oxygen?  - Yes Yes   Baseline Oxygen Saturation %  - 95 % 95 %   Baseline Liters of Oxygen  -  - 0 L  Room Air   1 Minute Oxygen Saturation %  -  - 94 %   1 Minute Liters of Oxygen  -  - 0 L   2 Minute Liters of Oxygen  -  - 0 L   3 Minute Liters of Oxygen  -  - 0 L   4 Minute Oxygen Saturation %  -  - 91 %   4 Minute Liters of Oxygen  -  - 0 L   5 Minute Oxygen Saturation %  -  - 87 %   5 Minute Liters of Oxygen  -  - 0 L   6 Minute Oxygen Saturation %  - 95 % 91 %   6 Minute Liters of Oxygen  -  - 0 L   2 Minute Post Oxygen Saturation %  - 95 % 97 %   2 Minute Post Liters of Oxygen  -  - 0 L      Psychological, QOL, Others - Outcomes: PHQ 2/9: Depression screen [facility] 2/9 05/26/2016 02/15/2016  Decreased Interest 0 0  Down, Depressed, Hopeless 1 0  PHQ - 2 Score 1 0  Altered sleeping 3 1  Tired, decreased energy 1 1  Change in appetite 0 1  Feeling bad or failure about yourself  0 0  Trouble concentrating 0 0  Moving slowly or fidgety/restless 0  0  Suicidal thoughts 0 0  PHQ-9 Score 5 3  Difficult doing work/chores Somewhat difficult Not difficult at all    Quality of Life:     Quality of Life - 02/15/16 1435      Quality of Life Scores   Health/Function Pre 18.5 %   Socioeconomic Pre 23.29 %   Psych/Spiritual Pre 22.86 %   Family Pre 28.8 %   GLOBAL Pre 21.8 %      Personal Goals: Goals established at orientation with interventions provided to work toward goal.     Personal Goals and Risk Factors at Admission - 02/15/16 1330      Core Components/Risk Factors/Patient Goals on Admission    Weight Management Yes;Weight Loss   Intervention Weight Management: Develop a combined nutrition and exercise program designed to reach desired caloric intake, while maintaining appropriate intake of nutrient and fiber, sodium and fats, and appropriate energy expenditure required for the weight goal.;Weight Management: Provide education and appropriate resources to help participant work on and attain dietary goals.;Weight Management/Obesity: Establish reasonable short term and long term weight goals.   Admit Weight 125 lb (56.7 kg)   Goal Weight: Short Term 120 lb (54.4 kg)   Goal Weight: Long Term 115 lb (52.2 kg)   Expected Outcomes Short Term: Continue to  assess and modify interventions until short term weight is achieved;Long Term: Adherence to nutrition and physical activity/exercise program aimed toward attainment of established weight goal;Weight Maintenance: Understanding of the daily nutrition guidelines, which includes 25-35% calories from fat, 7% or less cal from saturated fats, less than 200mg  cholesterol, less than 1.5gm of sodium, & 5 or more servings of fruits and vegetables daily;Weight Loss: Understanding of general recommendations for a balanced deficit meal plan, which promotes 1-2 lb weight loss per week and includes a negative energy balance of [phone] kcal/d;Understanding recommendations for meals to include 15-35%  energy as protein, 25-35% energy from fat, 35-60% energy from carbohydrates, less than 200mg  of dietary cholesterol, 20-35 gm of total fiber daily;Understanding of distribution of calorie intake throughout the day with the consumption of 4-5 meals/snacks   Sedentary Yes   Intervention Provide advice, education, support and counseling about physical activity/exercise needs.;Develop an individualized exercise prescription for aerobic and resistive training based on initial evaluation findings, risk stratification, comorbidities and participant's personal goals.   Expected Outcomes Achievement of increased cardiorespiratory fitness and enhanced flexibility, muscular endurance and strength shown through measurements of functional capacity and personal statement of participant.   Increase Strength and Stamina Yes   Intervention Provide advice, education, support and counseling about physical activity/exercise needs.;Develop an individualized exercise prescription for aerobic and resistive training based on initial evaluation findings, risk stratification, comorbidities and participant's personal goals.   Expected Outcomes Achievement of increased cardiorespiratory fitness and enhanced flexibility, muscular endurance and strength shown through measurements of functional capacity and personal statement of participant.   Improve shortness of breath with ADL's Yes   Intervention Provide education, individualized exercise plan and daily activity instruction to help decrease symptoms of SOB with activities of daily living.   Expected Outcomes Short Term: Achieves a reduction of symptoms when performing activities of daily living.   Develop more efficient breathing techniques such as purse lipped breathing and diaphragmatic breathing; and practicing self-pacing with activity Yes   Intervention Provide education, demonstration and support about specific breathing techniuqes utilized for more efficient breathing.  Include techniques such as pursed lipped breathing, diaphragmatic breathing and self-pacing activity.   Expected Outcomes Short Term: Participant will be able to demonstrate and use breathing techniques as needed throughout daily activities.   Increase knowledge of respiratory medications and ability to use respiratory devices properly  Yes  MDI's Symbicort, Ventolin, Combivent; SVN Albuterol and Atrovent   Intervention Provide education and demonstration as needed of appropriate use of medications, inhalers, and oxygen therapy.   Expected Outcomes Short Term: Achieves understanding of medications use. Understands that oxygen is a medication prescribed by physician. Demonstrates appropriate use of inhaler and oxygen therapy.       Personal Goals Discharge:     Goals and Risk Factor Review    Row Name 02/22/16 1610 03/06/16 1130 03/22/16 1214 04/03/16 1130 05/19/16 1340     Core Components/Risk Factors/Patient Goals Review   Personal Goals Review Develop more efficient breathing techniques such as purse lipped breathing and diaphragmatic breathing and practicing self-pacing with activity. Increase Strength and Stamina;Improve shortness of breath with ADL's;Increase knowledge of respiratory medications and ability to use respiratory devices properly. Develop more efficient breathing techniques such as purse lipped breathing and diaphragmatic breathing and practicing self-pacing with activity.;Improve shortness of breath with ADL's;Sedentary Increase knowledge of respiratory medications and ability to use respiratory devices properly. Sedentary;Increase Strength and Stamina   Review Heidi [name] first day of exercise in [facility] - she had been  in the program before and adjusted to the exercise goals and equipment set-up. Using PLB is part of her daily management of her shortness of breath with activies. She has good technique and needs no queing with her exerciae goals in [facility].  Heidi [name] has  noticed an improvement in her shortness of breath since starting [facility]. She knows her limits and knows when to stop and rest. Her stamina and energy level has increased since she was so sick with the flu.  We discussed her MDI's and  is using her spacer . When she goes out for the day, Heidi [name]  will take a SVN treatment  for her breathing. Pt is exercising 1 additional day a week at home using stationary bike and weights. She has been using pursed lip breathing to improve her ADLs.  High heat days are still making in hard for her to breathe in general.  She is only needing to use her O2 at night.  She has noticed the biggest difference in her breathing on the treadmill when using pursed lip breathing.. Reviewed Heidi [name]'s MDI's - she uses her Combivent for rescue and has the Ventolin MDI as back-up. She is using her SVN everyday at least once a day and uses her Symibort as prescribed with a spacer. Heidi [name] is using her rower and handweights at home on off days.  She is starting to notice the increase in strength and stamina.   Expected Outcomes Continue using PLB daily for activity at home and with exercise goals in [facility].  - Plan to continue to see improvements and will eventually add in 2nd day at home for exercise. Continue to use MDI's and SVN as ordered and use spacer for the Combivent and Symbicort. Heidi [name] will continue to come to exercise and upon graduation will join [employer].   Row Name 05/23/16 0736 05/23/16 0745           Core Components/Risk Factors/Patient Goals Review   Personal Goals Review Develop more efficient breathing techniques such as purse lipped breathing and diaphragmatic breathing and practicing self-pacing with activity.;Improve shortness of breath with ADL's;Increase knowledge of respiratory medications and ability to use respiratory devices properly.;Weight Management/Obesity;Sedentary;Increase Strength and Stamina  -      Review Heidi [name] has maintained a consistent  weight in the low 120's and understands the importance of weight management with regular exercise. We discussed the importance of lower weight in the stomach for more efficiency with the diaphragm contractions. Shehas a good understanding of her MDI's and uses her SVN as needed. She manages her shortness of breath with PLB and pacing. Her UCSD questionnaire improved by 8 points - Minimal Importance Difference for COPD is 5 points. Her exercise plan is joining [employer] and exercising 3 days/week  -      Expected Outcomes  - Continue to manage her COPD with regular exercise at [facility] and at home, use her MDI's daily, perform PLB and pacing to manage her shortness of breath, and continue educating herself on new information on COPD through the [facility] and other resouces.         Nutrition & Weight - Outcomes:     Pre Biometrics - 02/15/16 1438      Pre Biometrics   Height 5' 5.5" (1.664 m)   Weight 122 lb 6.4 oz (55.5 kg)   Waist Circumference 37 inches   Hip Circumference 35 inches   Waist to Hip Ratio 1.06 %   BMI (Calculated)  20.1       Nutrition:     Nutrition Therapy & Goals - 02/15/16 1330      Nutrition Therapy   Diet Heidi [name] would like to meet with the dietitian; large portion sizes, but low salt and sweets; she does like fruits and vegetables; she has cut her soda to 2 cans a day.      Nutrition Discharge:   Education Questionnaire Score:     Knowledge Questionnaire Score - 02/15/16 1330      Knowledge Questionnaire Score   Pre Score 10/10      Goals reviewed with patient; copy given to patient.

## 2016-05-29 NOTE — Progress Notes (Signed)
Daily Session Note  Patient Details  Name: Heidi [name] MRN: [id number] Date of Birth: 1956/06/20 Referring Provider:   Arn Medal Row Documentation from 04/10/2016 in [facility]  Referring Provider  Dr. Simonne Maffucci      Encounter Date: 05/29/2016  Check In:     Session Check In - 05/29/16 1218      Check-In   Location [facility]-Cardiac & Pulmonary Rehab   Staff Present Earlean Shawl, BS, ACSM CEP, Exercise Physiologist;Laureen Owens Shark, BS, RRT, Respiratory Therapist;Rebecca Brayton El, DPT, CEEA   Supervising physician immediately available to respond to emergencies [facility] immediately available ER MD   Physician(s) Kerman Passey and Quale   Medication changes reported     No   Fall or balance concerns reported    No   Warm-up and Cool-down Performed on first and last piece of equipment   Resistance Training Performed Yes   VAD Patient? No     Pain Assessment   Currently in Pain? No/denies   Multiple Pain Sites No         Goals Met:  Proper associated with RPD/PD & O2 Sat Independence with exercise equipment Exercise tolerated well Personal goals reviewed Strength training completed today  Goals Unmet:  Not Applicable  Comments: Today [name] is finishing pulmonary rehab. Her continuing exercise plans/discharge instructions were reviewed with her. She plans to continue to exercise in [employer] exercise classes in the same facility.    Dr. Emily Filbert is Medical Director for [place] and [facility].

## 2016-05-31 ENCOUNTER — Encounter: Payer: Medicare Other | Attending: Pulmonary Disease

## 2016-05-31 DIAGNOSIS — J449 Chronic obstructive pulmonary disease, unspecified: Secondary | ICD-10-CM | POA: Insufficient documentation

## 2016-06-02 ENCOUNTER — Ambulatory Visit: Payer: Medicare Other

## 2016-06-07 NOTE — Progress Notes (Signed)
Pulmonary Individual Treatment Plan  Patient Details  Name: Heidi [name] MRN: [id number] Date of Birth: 03-09-1956 Referring Provider:   Arn Medal Row Documentation from 04/10/2016 in [facility] Cardiac and Pulmonary Rehab  Referring Provider  Dr. Simonne Maffucci      Initial Encounter Date:  Flowsheet Row Documentation from 04/10/2016 in [facility] Cardiac and Pulmonary Rehab  Referring Provider  Dr. Simonne Maffucci      Visit Diagnosis: COPD, moderate ([place])  Patient's Home Medications on Admission:  Current Outpatient Prescriptions:    albuterol (PROVENTIL) (2.5 MG/3ML) 0.083% nebulizer solution, USE ONE VIAL IN NEBULIZER EVERY 6 HOURS AS NEEDED FOR WHEEZING OR SHORTNESS OF BREATH, Disp: 360 mL, Rfl: 5   ALPRAZolam (XANAX) 0.25 MG tablet, Take 0.25 mg by mouth at bedtime as needed for anxiety., Disp: , Rfl:    budesonide-formoterol (SYMBICORT) 160-4.5 MCG/ACT inhaler, Inhale 2 puffs into the lungs 2 (two) times daily., Disp: 1 Inhaler, Rfl: 6   escitalopram (LEXAPRO) 5 MG tablet, Take 20 mg by mouth daily. , Disp: , Rfl:    fluticasone (FLONASE) 50 MCG/ACT nasal spray, USE TWO SPRAY(S) IN EACH NOSTRIL ONCE DAILY, Disp: 16 g, Rfl: 5   ipratropium (ATROVENT) 0.02 % nebulizer solution, Take 2.5 mLs (0.5 mg total) by nebulization every 6 (six) hours as needed for wheezing or shortness of breath. Dx Code J43.2, Disp: 360 mL, Rfl: 3   ipratropium (ATROVENT) 0.02 % nebulizer solution, INHALE ONE VIAL EVERY 6 HOURS AS NEEDED FOR WHEEZING OR SHORTNESS OF BREATH, Disp: 360 mL, Rfl: 5   Ipratropium-Albuterol (COMBIVENT RESPIMAT) 20-100 MCG/ACT AERS respimat, Inhale 1 puff into the lungs every 6 (six) hours as needed for wheezing., Disp: 1 Inhaler, Rfl: 5   pantoprazole (PROTONIX) 40 MG tablet, Take 40 mg by mouth 2 (two) times daily. Take 30-60 min before first meal of the day, Disp: , Rfl:   Past Medical History: Past Medical History:  Diagnosis Date   COPD (chronic obstructive pulmonary  disease) (HCC)    GERD (gastroesophageal reflux disease)    Hypotension    limiting med titration   NSTEMI (non-ST elevated myocardial infarction) ([place])    12/2011 with normal coronaries possibly secondary to Takotsubo (EF 30-35% by echo), occurred following two episodes of acute respiratory distress   Takotsubo syndrome 4/13    Tobacco Use: History  Smoking Status   Former Smoker   Packs/day: 1.00   Years: 30.00   Types: Cigarettes   Quit date: 01/21/2012  Smokeless Tobacco   Never Used    Labs: Recent Review Flowsheet Data    Labs for ITP Cardiac and Pulmonary Rehab Latest Ref Rng & Units 01/23/2012 01/24/2012 02/01/2012 08/08/2012 08/08/2012   Cholestrol 0 - 200 mg/dL - 134 - - -   LDLCALC 0 - 99 mg/dL - 41 - - -   HDL >39 mg/dL - 80 - - -   Trlycerides <150 mg/dL - 65 - - -   Hemoglobin A1c <5.7 % 5.6 - - - -   PHART 7.350 - 7.450 7.391 - 7.409(H) 7.206(L) 7.332(L)   PCO2ART 35.0 - 45.0 mmHg 38.4 - 39.4 46.5(H) 47.3(H)   HCO3 20.0 - 24.0 mEq/L 23.3 - 24.4(H) 18.4(L) 25.1(H)   TCO2 0 - 100 mmol/L 24 - 21._0 ACIDBASEDEF 0.0 - 2.0 mmol/L 1.0 - - 9.0(H) 1.0   O2SAT % 98.0 - 94.1 100.0 99.0       ADL UCSD:     Pulmonary Assessment Scores    Row  Name 02/15/16 1330         ADL UCSD   ADL Phase Entry     SOB Score total 55     Rest 0     Walk 1     Stairs 3     Bath 1     Dress 1     Shop 2        Pulmonary Function Assessment:     Pulmonary Function Assessment - 02/15/16 1420      Pulmonary Function Tests   RV% 178 %   DLCO% 48 %     Initial Spirometry Results   FVC% 45 %   FEV1% 27 %   FEV1/FVC Ratio 45     Post Bronchodilator Spirometry Results   FVC% 63 %   FEV1% 32 %   FEV1/FVC Ratio 38     Breath   Bilateral Breath Sounds Clear;Decreased   Shortness of Breath Yes;Fear of Shortness of Breath;Limiting activity      Exercise Target Goals:    Exercise Program Goal: Individual exercise prescription set with THRR, safety &  activity barriers. Participant demonstrates ability to understand and report RPE using BORG scale, to self-measure pulse accurately, and to acknowledge the importance of the exercise prescription.  Exercise Prescription Goal: Starting with aerobic activity 30 plus minutes a day, 3 days per week for initial exercise prescription. Provide home exercise prescription and guidelines that participant acknowledges understanding prior to discharge.  Activity Barriers & Risk Stratification:     Activity Barriers & Cardiac Risk Stratification - 02/15/16 1330      Activity Barriers & Cardiac Risk Stratification   Activity Barriers Shortness of Breath;Deconditioning   Cardiac Risk Stratification Moderate      6 Minute Walk:     6 Minute Walk    Row Name 02/15/16 1435 02/15/16 1447 05/26/16 1329     6 Minute Walk   Phase Initial  -- Discharge   Distance 1070 feet  -- 1070 feet   Distance % Change  --  -- 6 %   Walk Time 6 minutes  -- 6 minutes   # of Rest Breaks  --  -- 0   MPH 2  -- 2.15   METS  --  -- 4.12   RPE 12  -- 13.5   Perceived Dyspnea   --  -- 4   VO2 Peak  --  -- 14.42   Symptoms  --  -- No   Resting HR 88 bpm  -- 95 bpm   Resting BP 118/68  -- 126/70   Max Ex. HR 91 bpm  -- 146 bpm   Max Ex. BP 122/72  -- 136/60   2 Minute Post BP  --  -- 136/66     Interval HR   Baseline HR  --  -- 97   1 Minute HR  --  -- 101   5 Minute HR  --  -- 143   6 Minute HR  --  -- 146   2 Minute Post HR  --  -- 102   Interval Heart Rate?  --  -- Yes     Interval Oxygen   Interval Oxygen?  -- Yes Yes   Baseline Oxygen Saturation %  -- 95 % 95 %   Baseline Liters of Oxygen  --  -- 0 L  Room Air   1 Minute Oxygen Saturation %  --  -- 94 %   1 Minute Liters of Oxygen  --  --  0 L   2 Minute Liters of Oxygen  --  -- 0 L   3 Minute Liters of Oxygen  --  -- 0 L   4 Minute Oxygen Saturation %  --  -- 91 %   4 Minute Liters of Oxygen  --  -- 0 L   5 Minute Oxygen Saturation %  --  -- 87 %    5 Minute Liters of Oxygen  --  -- 0 L   6 Minute Oxygen Saturation %  -- 95 % 91 %   6 Minute Liters of Oxygen  --  -- 0 L   2 Minute Post Oxygen Saturation %  -- 95 % 97 %   2 Minute Post Liters of Oxygen  --  -- 0 L      Initial Exercise Prescription:     Initial Exercise Prescription - 04/10/16 1400      Date of Initial Exercise RX and Referring Provider   Referring Provider Dr. Simonne Maffucci      Perform Capillary Blood Glucose checks as needed.  Exercise Prescription Changes:     Exercise Prescription Changes    Row Name 02/22/16 0700 02/28/16 1400 03/08/16 1200 04/03/16 1300 04/12/16 1200     Exercise Review   Progression  -- Yes Yes Yes Yes     Response to Exercise   Blood Pressure (Admit) 130/80 118/78 118/78 122/62  --   Blood Pressure (Exercise) 138/78 148/70 148/70 138/84  --   Blood Pressure (Exit) 114/64 124/66 124/66 112/60  --   Heart Rate (Admit) 99 bpm 78 bpm 78 bpm 80 bpm  --   Heart Rate (Exercise) 98 bpm 114 bpm 114 bpm 114 bpm  --   Heart Rate (Exit) 74 bpm 87 bpm 87 bpm 85 bpm  --   Oxygen Saturation (Admit) 94 % 95 % 95 % 98 %  --   Oxygen Saturation (Exercise) 92 % 96 % 96 % 95 %  --   Oxygen Saturation (Exit) 97 % 95 % 95 % 98 %  --   Rating of Perceived Exertion (Exercise) _0 --   Perceived Dyspnea (Exercise) 3 2.5 2.5 3  --   Symptoms  --  --  --  -- none   Comments data entry for 02/21/2016 - Ms Mccallister's first day of exercise in [employer].   --  --  -- Home Exercise Given 04/12/16   Duration  -- Progress to 50 minutes of aerobic without signs/symptoms of physical distress Progress to 50 minutes of aerobic without signs/symptoms of physical distress Progress to 50 minutes of aerobic without signs/symptoms of physical distress Progress to 50 minutes of aerobic without signs/symptoms of physical distress   Intensity  -- Rest + 30 Rest + 30 THRR New THRR New     Progression   Progression  --  --  -- --  115-146 --  115-146      Resistance Training   Training Prescription _1    Weight _2 Reps 10-12 10-12 10-12 10-12 10-12     Treadmill   MPH 1.5 1.5 1._3 Grade 0 0 0 0 0   Minutes _4 10/5 15  10/5     NuStep   Level _5 Watts 40 40 40 60 60   Minutes _6 15  REL-XR   Level  --  -- _0 Watts  --  -- 60 60 60     Biostep-RELP   Level _1 Watts _2 Minutes _3 Home Exercise Plan   Plans to continue exercise at  --  --  --  -- Home  walking and rower   Frequency  --  --  --  -- Add 2 additional days to program exercise sessions.   Row Name 04/18/16 1600 05/17/16 1300 05/29/16 1200         Exercise Review   Progression Yes Yes Yes       Response to Exercise   Blood Pressure (Admit) 110/60 134/60 122/68     Blood Pressure (Exercise) 148/80 116/60 140/84     Blood Pressure (Exit) 116/60 108/60  --     Heart Rate (Admit) 96 bpm 91 bpm 88 bpm     Heart Rate (Exercise) 112 bpm 108 bpm 113 bpm     Heart Rate (Exit) 98 bpm 98 bpm  --     Oxygen Saturation (Admit) 95 % 96 % 97 %     Oxygen Saturation (Exercise) 91 % 90 % 94 %     Oxygen Saturation (Exit) 97 % 97 %  --     Rating of Perceived Exertion (Exercise) _4 Perceived Dyspnea (Exercise)  --  -- 3     Duration  -- Progress to 45 minutes of aerobic exercise without signs/symptoms of physical distress Progress to 45 minutes of aerobic exercise without signs/symptoms of physical distress     Intensity THRR unchanged THRR unchanged THRR unchanged  115-146       Progression   Progression  -- Continue to progress workloads to maintain intensity without signs/symptoms of physical distress. Continue to progress workloads to maintain intensity without signs/symptoms of physical distress.     Average METs  --  -- 5       Resistance Training   Training Prescription Yes Yes Yes     Weight _5 Reps 10-12 10-15 10-15       Treadmill   MPH  2 1.7 1.8     Grade 0 0 0     Minutes _6 NuStep   Level 3  -- 3     Watts 40  --  --     Minutes 15  -- 15     METs  --  -- 4       REL-XR   Level _7 Watts 60  --  --     Minutes  -- 15 15       Home Exercise Plan   Plans to continue exercise at  --  -- [employer]  After graduation, coming to FF class.         Exercise Comments:     Exercise Comments    Row Name 03/22/16 1217 04/03/16 1535 04/12/16 1251 04/18/16 1606 05/03/16 1340   Exercise Comments Pt plans to make more progress in program to work to 15 min continuous on all equipment before adding in 2nd day of home exercise. Ms Chirino will start the 1:15 FF class when she has completed 18 sessions of [employer]. Reviewed home exercise with pt today.  Pt plans to walk and use stationary rower at home for exercise.  We discussed her progression of exercise.  Reviewed THR, pulse, RPE, sign and symptoms, and when to call 911 or MD.  Also discussed weather considerations and indoor options.  Pt voiced understanding. Ms Kniskern is progessing well with exercise  Cynthias last visit was 04/12/16   Row Name 05/17/16 1407 05/26/16 1331         Exercise Comments Today was Cynthias first day back since 04/12/16. Jenny Reichmann completed her 6 min walk test today.  She improved by 65 ft.  She is planning to graduate on Monday and join our [employer] starting Wed. Aug 2.         Discharge Exercise Prescription (Final Exercise Prescription Changes):     Exercise Prescription Changes - 05/29/16 1200      Exercise Review   Progression Yes     Response to Exercise   Blood Pressure (Admit) 122/68   Blood Pressure (Exercise) 140/84   Heart Rate (Admit) 88 bpm   Heart Rate (Exercise) 113 bpm   Oxygen Saturation (Admit) 97 %   Oxygen Saturation (Exercise) 94 %   Rating of Perceived Exertion (Exercise) 13   Perceived Dyspnea (Exercise) 3   Duration Progress to 45 minutes of aerobic exercise without signs/symptoms of  physical distress   Intensity THRR unchanged  115-146     Progression   Progression Continue to progress workloads to maintain intensity without signs/symptoms of physical distress.   Average METs 5     Resistance Training   Training Prescription Yes   Weight 3   Reps 10-15     Treadmill   MPH 1.8   Grade 0   Minutes 15     NuStep   Level 3   Minutes 15   METs 4     REL-XR   Level 4   Minutes 15     Home Exercise Plan   Plans to continue exercise at [employer]  After graduation, coming to FF class.        Nutrition:  Target Goals: Understanding of nutrition guidelines, daily intake of sodium <1529m, cholesterol <2023m calories 30% from fat and 7% or less from saturated fats, daily to have 5 or more servings of fruits and vegetables.  Biometrics:     Pre Biometrics - 02/15/16 1438      Pre Biometrics   Height 5' 5.5" (1.664 m)   Weight 122 lb 6.4 oz (55.5 kg)   Waist Circumference 37 inches   Hip Circumference 35 inches   Waist to Hip Ratio 1.06 %   BMI (Calculated) 20.1       Nutrition Therapy Plan and Nutrition Goals:     Nutrition Therapy & Goals - 02/15/16 1330      Nutrition Therapy   Diet Ms DoSterbenzould like to meet with the dietitian; large portion sizes, but low salt and sweets; she does like fruits and vegetables; she has cut her soda to 2 cans a day.      Nutrition Discharge: Rate Your Plate Scores:   Psychosocial: Target Goals: Acknowledge presence or absence of depression, maximize coping skills, provide positive support system. Participant is able to verbalize types and ability to use techniques and skills needed for reducing stress and depression.  Initial Review & Psychosocial Screening:     Initial Psych Review & Screening - 02/15/16 1330      Family Dynamics   Good Support System? Yes  Comments Ms Saha has good support from her boyfriend and daughter. Her hospital admission for COPD exacerbation in March was quite  serious and is a concern for her to manage her COPD.     Barriers   Psychosocial barriers to participate in program The patient should benefit from training in stress management and relaxation.     Screening Interventions   Interventions Encouraged to exercise;Program counselor consult      Quality of Life Scores:     Quality of Life - 02/15/16 1435      Quality of Life Scores   Health/Function Pre 18.5 %   Socioeconomic Pre 23.29 %   Psych/Spiritual Pre 22.86 %   Family Pre 28.8 %   GLOBAL Pre 21.8 %      PHQ-9: Recent Review Flowsheet Data    Depression screen [facility] 2/9 05/26/2016 02/15/2016   Decreased Interest 0 0   Down, Depressed, Hopeless 1 0   PHQ - 2 Score 1 0   Altered sleeping 3 1   Tired, decreased energy 1 1   Change in appetite 0 1   Feeling bad or failure about yourself  0 0   Trouble concentrating 0 0   Moving slowly or fidgety/restless 0 0   Suicidal thoughts 0 0   PHQ-9 Score 5 3   Difficult doing work/chores Somewhat difficult Not difficult at all      Psychosocial Evaluation and Intervention:     Psychosocial Evaluation - 05/29/16 1228      Discharge Psychosocial Assessment & Intervention   Comments Discharge meeting with Ms. Flegal today as she is completing this program.  She reports her energy levels and her appetite have improved since beginning this program.  She states her mood is positive most of the time.  She does have a new stressor with her elderly father in hospital and not doing well, but she is handling that well with cognitive approaches.  Ms. Maultsby states she will be returning to the [employer] program [facility] at [facility] to maintain her progress made.  Counselor commended her for all her hard work and consistency in exercising.        Psychosocial Re-Evaluation:     Psychosocial Re-Evaluation    [place] Name 03/15/16 1257 04/12/16 1232           Psychosocial Re-Evaluation   Comments Counselor follow up with Ms. flatley today  reporting she has been experiencing "tremors" more lately and had a negative reaction to the medication her PCP gave her.  She asked for information on local psychiatrists as she thinks she would benefit from a medication evaluation.  Counselor assessed her for depression and anxiety with ms. Dante reporting her anxiety levels are high and although exercise helps decrease this, it is only temporary.  Counselor provided information on psychiatrists for her to consider to help with this.  Counselor will continue to follow with Ms. Cargo during this course of this program.   Counselor follow up with Ms. Heenan today stating her mood is stable and the tremors have d/c.  She states an increase in energy and strength since coming into this program and that she "definitely feels better."        Education: Education Goals: Education classes will be provided on a weekly basis, covering required topics. Participant will state understanding/return demonstration of topics presented.  Learning Barriers/Preferences:     Learning Barriers/Preferences - 02/15/16 1330      Learning Barriers/Preferences   Learning Barriers None  Learning Preferences Group Instruction;Individual Instruction;Pictoral;Skilled Demonstration;Verbal Instruction;Video;Written Material      Education Topics: Initial Evaluation Education: - Verbal, written and demonstration of respiratory meds, RPE/PD scales, oximetry and breathing techniques. Instruction on use of nebulizers and MDIs: cleaning and proper use, rinsing mouth with steroid doses and importance of monitoring MDI activations. Flowsheet Row Pulmonary Rehab from 02/25/2016 in [facility] Cardiac and Pulmonary Rehab  Date  02/15/16  Educator  LB  Instruction Review Code  2- meets goals/outcomes      General Nutrition Guidelines/Fats and Fiber: -Group instruction provided by verbal, written material, models and posters to present the general guidelines for heart healthy  nutrition. Gives an explanation and review of dietary fats and fiber. Flowsheet Row Pulmonary Rehab from 05/22/2016 in [facility] Cardiac and Pulmonary Rehab  Date  05/22/16 Marcelina Morel attended on 04/03/16]  Educator  CR  Instruction Review Code  2- meets goals/outcomes      Controlling Sodium/Reading Food Labels: -Group verbal and written material supporting the discussion of sodium use in heart healthy nutrition. Review and explanation with models, verbal and written materials for utilization of the food label.   Exercise Physiology & Risk Factors: - Group verbal and written instruction with models to review the exercise physiology of the cardiovascular system and associated critical values. Details cardiovascular disease risk factors and the goals associated with each risk factor. Flowsheet Row Pulmonary Rehab from 05/22/2016 in [facility] Cardiac and Pulmonary Rehab  Date  05/17/16  Educator  Alberteen Sam and Griselda Miner  Instruction Review Code  2- meets goals/outcomes      Aerobic Exercise & Resistance Training: - Gives group verbal and written discussion on the health impact of inactivity. On the components of aerobic and resistive training programs and the benefits of this training and how to safely progress through these programs.   Flexibility, Balance, General Exercise Guidelines: - Provides group verbal and written instruction on the benefits of flexibility and balance training programs. Provides general exercise guidelines with specific guidelines to those with heart or lung disease. Demonstration and skill practice provided. Flowsheet Row Pulmonary Rehab from 05/22/2016 in [facility] Cardiac and Pulmonary Rehab  Date  03/08/16  Educator  Salley Hews, PT  Instruction Review Code  2- meets goals/outcomes      Stress Management: - Provides group verbal and written instruction about the health risks of elevated stress, cause of high stress, and healthy ways to reduce stress. Flowsheet Row  Pulmonary Rehab from 05/22/2016 in [facility] Cardiac and Pulmonary Rehab  Date  03/15/16  Educator  [facility]  Instruction Review Code  2- meets goals/outcomes      Depression: - Provides group verbal and written instruction on the correlation between heart/lung disease and depressed mood, treatment options, and the stigmas associated with seeking treatment. Flowsheet Row Pulmonary Rehab from 05/22/2016 in [facility] Cardiac and Pulmonary Rehab  Date  04/12/16  Educator  Lucianne Lei, [facility]  Instruction Review Code  2- meets goals/outcomes      Exercise & Equipment Safety: - Individual verbal instruction and demonstration of equipment use and safety with use of the equipment. Flowsheet Row Pulmonary Rehab from 05/22/2016 in [facility] Cardiac and Pulmonary Rehab  Date  02/28/16  Educator  C. EnterkinRN  Instruction Review Code  2- meets goals/outcomes      Infection Prevention: - Provides verbal and written material to individual with discussion of infection control including proper hand washing and proper equipment cleaning during exercise session.   Falls Prevention: - Provides verbal and written material  to individual with discussion of falls prevention and safety. Flowsheet Row Pulmonary Rehab from 02/25/2016 in [facility] Cardiac and Pulmonary Rehab  Date  02/15/16  Educator  LB  Instruction Review Code  2- meets goals/outcomes      Diabetes: - Individual verbal and written instruction to review signs/symptoms of diabetes, desired ranges of glucose level fasting, after meals and with exercise. Advice that pre and post exercise glucose checks will be done for 3 sessions at entry of program.   Chronic Lung Diseases: - Group verbal and written instruction to review new updates, new respiratory medications, new advancements in procedures and treatments. Provide informative websites and "800" numbers of self-education. Flowsheet Row Pulmonary Rehab from 05/22/2016 in [facility] Cardiac and Pulmonary Rehab  Date   03/06/16  Educator  L. Brown,RT  Instruction Review Code  2- meets goals/outcomes      Lung Procedures: - Group verbal and written instruction to describe testing methods done to diagnose lung disease. Review the outcome of test results. Describe the treatment choices: Pulmonary Function Tests, ABGs and oximetry. Flowsheet Row Pulmonary Rehab from 05/22/2016 in [facility] Cardiac and Pulmonary Rehab  Date  05/19/16  Educator  [place]  Instruction Review Code  2- meets goals/outcomes      Energy Conservation: - Provide group verbal and written instruction for methods to conserve energy, plan and organize activities. Instruct on pacing techniques, use of adaptive equipment and posture/positioning to relieve shortness of breath.   Triggers: - Group verbal and written instruction to review types of environmental controls: home humidity, furnaces, filters, dust mite/pet prevention, HEPA vacuums. To discuss weather changes, air quality and the benefits of nasal washing. Flowsheet Row Pulmonary Rehab from 02/25/2016 in [facility] Cardiac and Pulmonary Rehab  Date  02/21/16  Educator  LB  Instruction Review Code  2- meets goals/outcomes      Exacerbations: - Group verbal and written instruction to provide: warning signs, infection symptoms, calling MD promptly, preventive modes, and value of vaccinations. Review: effective airway clearance, coughing and/or vibration techniques. Create an Sports administrator.   Oxygen: - Individual and group verbal and written instruction on oxygen therapy. Includes supplement oxygen, available portable oxygen systems, continuous and intermittent flow rates, oxygen safety, concentrators, and Medicare reimbursement for oxygen.   Respiratory Medications: - Group verbal and written instruction to review medications for lung disease. Drug class, frequency, complications, importance of spacers, rinsing mouth after steroid MDI's, and proper cleaning methods for nebulizers. Flowsheet Row  Pulmonary Rehab from 02/25/2016 in [facility] Cardiac and Pulmonary Rehab  Date  02/15/16  Educator  LB  Instruction Review Code  2- meets goals/outcomes      AED/CPR: - Group verbal and written instruction with the use of models to demonstrate the basic use of the AED with the basic ABC's of resuscitation.   Breathing Retraining: - Provides individuals verbal and written instruction on purpose, frequency, and proper technique of diaphragmatic breathing and pursed-lipped breathing. Applies individual practice skills. Flowsheet Row Pulmonary Rehab from 02/25/2016 in [facility] Cardiac and Pulmonary Rehab  Date  02/15/16  Educator  LB  Instruction Review Code  2- meets goals/outcomes      Anatomy and Physiology of the Lungs: - Group verbal and written instruction with the use of models to provide basic lung anatomy and physiology related to function, structure and complications of lung disease.   Heart Failure: - Group verbal and written instruction on the basics of heart failure: signs/symptoms, treatments, explanation of ejection fraction, enlarged heart and cardiomyopathy. 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 Review of risk factors, methods for diagnosing and types of masks and machines for OSA.   Anxiety: - Provides group, verbal and written instruction on the correlation between heart/lung disease and anxiety, treatment options, and management of anxiety.   Relaxation: - Provides group, verbal and written instruction about the benefits of relaxation for patients with heart/lung disease. Also provides patients with examples of relaxation techniques. Flowsheet Row Pulmonary Rehab from 05/22/2016 in [facility] Cardiac [facility] Pulmonary Re[facility]  Date  03/29/16  Educator  [facility]   Instruction Review Code  2- Meets goals/outcomes      Knowledge Questionnaire Score:     Knowledge Questionnaire Score - 02/15/16 1330      Knowledge Questionnaire Score   Pre Score 10/10       Core Components/Risk Factors/Patient Goals at Admission:     Personal Goals and Risk Factors at Admission - 02/15/16 1330      Core Components/Risk Factors/Patient Goals on Admission    Weight Management Yes;Weight Loss   Intervention Weight Management: Develop a combined nutrition and exercise program designed to reach desired caloric intake, while maintaining appropriate intake of nutrient and fiber, sodium and fats, and appropriate energy expenditure required for the weight goal.;Weight Management: Provide education and appropriate resources to help participant work on and attain dietary goals.;Weight Management/Obesity: Establish reasonable short term and long term weight goals.   Admit Weight 125 lb (56.7 kg)   Goal Weight: Short Term 120 lb (54.4 kg)   Goal Weight: Long Term 115 lb (52.2 kg)   Expected Outcomes Short Term: Continue to assess and modify interventions until short term weight is achieved;Long Term: Adherence to nutrition and physical activity/exercise program aimed toward attainment of established weight goal;Weight Maintenance: Understanding of the daily nutrition guidelines, which includes 25-35% calories from fat, 7% or less cal from saturated fats, less than 276m cholesterol, less than 1.5gm of sodium, & 5 or more servings of fruits and vegetables daily;Weight Loss: Understanding of general recommendations for a balanced deficit meal plan, which promotes 1-2 lb weight loss per week and includes a negative energy balance of [id number] kcal/d;Understanding recommendations for meals to include 15-35% energy as protein, 25-35% energy from fat, 35-60% energy from carbohydrates, less than 2059mof dietary cholesterol, 20-35 gm of total fiber daily;Understanding of distribution of  calorie intake throughout the day with the consumption of 4-5 meals/snacks   Sedentary Yes   Intervention Provide advice, education, support and counseling about physical activity/exercise needs.;Develop an individualized exercise prescription for aerobic and resistive training based on initial evaluation findings, risk stratification, comorbidities and participant's personal goals.   Expected Outcomes Achievement of increased cardiorespiratory fitness and enhanced flexibility, muscular endurance and strength shown through measurements of functional capacity and personal statement of participant.   Increase Strength and Stamina Yes   Intervention Provide advice, education, support and counseling about physical activity/exercise needs.;Develop an individualized exercise prescription for aerobic and resistive training based on initial evaluation findings, risk stratification, comorbidities and participant's personal goals.   Expected Outcomes Achievement of increased cardiorespiratory fitness and enhanced flexibility, muscular endurance and strength shown through measurements of functional capacity and personal statement of participant.   Improve shortness of breath with ADL's Yes   Intervention Provide education, individualized exercise  plan and daily activity instruction to help decrease symptoms of SOB with activities of daily living.   Expected Outcomes Short Term: Achieves a reduction of symptoms when performing activities of daily living.   Develop more efficient breathing techniques such as purse lipped breathing and diaphragmatic breathing; and practicing self-pacing with activity Yes   Intervention Provide education, demonstration and support about specific breathing techniuqes utilized for more efficient breathing. Include techniques such as pursed lipped breathing, diaphragmatic breathing and self-pacing activity.   Expected Outcomes Short Term: Participant will be able to demonstrate and use  breathing techniques as needed throughout daily activities.   Increase knowledge of respiratory medications and ability to use respiratory devices properly  Yes  MDI's Symbicort, Ventolin, Combivent; SVN Albuterol and Atrovent   Intervention Provide education and demonstration as needed of appropriate use of medications, inhalers, and oxygen therapy.   Expected Outcomes Short Term: Achieves understanding of medications use. Understands that oxygen is a medication prescribed by physician. Demonstrates appropriate use of inhaler and oxygen therapy.      Core Components/Risk Factors/Patient Goals Review:      Goals and Risk Factor Review    Row Name 02/22/16 4970 03/06/16 1130 03/22/16 1214 04/03/16 1130 05/19/16 1340     Core Components/Risk Factors/Patient Goals Review   Personal Goals Review Develop more efficient breathing techniques such as purse lipped breathing and diaphragmatic breathing and practicing self-pacing with activity. Increase Strength and Stamina;Improve shortness of breath with ADL's;Increase knowledge of respiratory medications and ability to use respiratory devices properly. Develop more efficient breathing techniques such as purse lipped breathing and diaphragmatic breathing and practicing self-pacing with activity.;Improve shortness of breath with ADL's;Sedentary Increase knowledge of respiratory medications and ability to use respiratory devices properly. Sedentary;Increase Strength and Stamina   Review Ms Carino first day of exercise in [facility] - she had been in the program before and adjusted to the exercise goals and equipment set-up. Using PLB is part of her daily management of her shortness of breath with activies. She has good technique and needs no queing with her exerciae goals in [facility].  Ms Digiulio has noticed an improvement in her shortness of breath since starting [facility]. She knows her limits and knows when to stop and rest. Her stamina and energy level has  increased since she was so sick with the flu.  We discussed her MDI's and  is using her spacer . When she goes out for the day, Ms Aspinwall  will take a SVN treatment  for her breathing. Pt is exercising 1 additional day a week at home using stationary bike and weights. She has been using pursed lip breathing to improve her ADLs.  High heat days are still making in hard for her to breathe in general.  She is only needing to use her O2 at night.  She has noticed the biggest difference in her breathing on the treadmill when using pursed lip breathing.. Reviewed Ms Weideman's MDI's - she uses her Combivent for rescue and has the Ventolin MDI as back-up. She is using her SVN everyday at least once a day and uses her Symibort as prescribed with a spacer. Jenny Reichmann is using her rower and handweights at home on off days.  She is starting to notice the increase in strength and stamina.   Expected Outcomes Continue using PLB daily for activity at home and with exercise goals in [facility].  -- Plan to continue to see improvements and will eventually add in 2nd day at home for  exercise. Continue to use MDI's and SVN as ordered and use spacer for the Combivent and Symbicort. Jenny Reichmann will continue to come to exercise and upon graduation will join [employer].   [place] Name 05/23/16 0736 05/23/16 0745           Core Components/Risk Factors/Patient Goals Review   Personal Goals Review Develop more efficient breathing techniques such as purse lipped breathing and diaphragmatic breathing and practicing self-pacing with activity.;Improve shortness of breath with ADL's;Increase knowledge of respiratory medications and ability to use respiratory devices properly.;Weight Management/Obesity;Sedentary;Increase Strength and Stamina  --      Review Ms Stansell has maintained a consistent weight in the low 120's and understands the importance of weight management with regular exercise. We discussed the importance of lower weight in the stomach for  more efficiency with the diaphragm contractions. Shehas a good understanding of her MDI's and uses her SVN as needed. She manages her shortness of breath with PLB and pacing. Her UCSD questionnaire improved by 8 points - Minimal Importance Difference for COPD is 5 points. Her exercise plan is joining [employer] and exercising 3 days/week  --      Expected Outcomes  -- Continue to manage her COPD with regular exercise at [facility] and at home, use her MDI's daily, perform PLB and pacing to manage her shortness of breath, and continue educating herself on new information on COPD through the [facility] and other resouces.         Core Components/Risk Factors/Patient Goals at Discharge (Final Review):      Goals and Risk Factor Review - 05/23/16 0745      Core Components/Risk Factors/Patient Goals Review   Expected Outcomes Continue to manage her COPD with regular exercise at [facility] and at home, use her MDI's daily, perform PLB and pacing to manage her shortness of breath, and continue educating herself on new information on COPD through the [facility] and other resouces.      ITP Comments:     ITP Comments    Row Name 05/03/16 1342 05/08/16 1620 05/22/16 1332 05/29/16 1220     ITP Comments Cynthias last visit was 04/12/16 Ms Krienke called today and is with her father who has been admitted to the hospital with illness. She has also been with a family member who is at [facility]. Her plan is to return to [place] next week. Ms. Smedley met with Dr. Emily Filbert, program's medical director, one on one today.  Today Ms. Pardo is finishing pulmonary rehab today. Her continuing exercise plans/discharge instructions were reviewed with her. She plans to continue to exercise in [employer] exercise classes in the same facility.        Comments: Ms Common has graduated from [employer] and will attend [employer] 3days/week.

## 2016-06-09 ENCOUNTER — Ambulatory Visit: Payer: Medicare Other

## 2016-06-16 ENCOUNTER — Ambulatory Visit: Payer: Medicare Other

## 2016-06-19 ENCOUNTER — Ambulatory Visit (INDEPENDENT_AMBULATORY_CARE_PROVIDER_SITE_OTHER): Payer: Medicare Other | Admitting: Pulmonary Disease

## 2016-06-19 ENCOUNTER — Encounter: Payer: Self-pay | Admitting: Pulmonary Disease

## 2016-06-19 DIAGNOSIS — J441 Chronic obstructive pulmonary disease with (acute) exacerbation: Secondary | ICD-10-CM | POA: Diagnosis not present

## 2016-06-19 NOTE — Assessment & Plan Note (Addendum)
No exacerbations since March 2017 Plan: Flu shot in September Please continue using your Symbicort, Flonase and Combivent as you have been doing. Remember to rinse your mouth with water after use. Continue using your albuterol and atrovent as needed, as you have been doing. Continue with Pulmonary Rehab as you have been doing. Follow up with Dr. Trish MageMc Quaid in 6 months  Please contact office for sooner follow up if symptoms do not improve or worsen or seek emergency care

## 2016-06-19 NOTE — Progress Notes (Signed)
Attending:  The patient was seen and examined today with Kandice RobinsonsSarah Groce and I agree with the findings and her note.  She's been feeling much better since the last visit. She remains compliant with her Symbicort and Spiriva. She had a bit of hoarseness in the last couple days but in general she's been doing well. Dyspnea has improved significantly.  On exam: Lungs clear to auscultation Cardiovascular regular rate and rhythm no murmurs gallops rubs  COPD: Continue Spiriva and Symbicort Flu shot in a month Follow-up 6 months

## 2016-06-19 NOTE — Progress Notes (Signed)
History of Present Illness Heidi [name] is a 60 y.o. female former smoker with COPD followed by [name]  Synopsis: Heidi [name] first [facility] Sherene [facility]iresWert [facility]. She has COPD and 8 2013 FEV1 was 0.72 L (27% predicted). She smoked heavily up until October of 2013.   06/19/2016 4 Month Follow Up: Pt. States she is doing well. She gets winded with the heat, but is using her oxygen at bedtime. She is participating in Pulmonary rehab and is doing well with it. She is using her albuterol and atrovent  nebs at least once daily. She is compliant with her Symbicort, Flonase, and her Combivent Respimat. She is also compliant with her Protonix. She has a new hoarseness of voice that started about 2 days ago. She denies chest pain, fever, purulent secretions , orthopnea or hemoptysis.    Past medical hx Past Medical History:  Diagnosis Date  . COPD (chronic obstructive pulmonary disease) (HCC)   . GERD (gastroesophageal reflux disease)   . Hypotension    limiting med titration  . NSTEMI (non-ST elevated myocardial infarction) (HCC)    12/2011 with normal coronaries possibly secondary to Takotsubo (EF 30-35% by echo), occurred following two episodes of acute respiratory distress  . Takotsubo syndrome 4/13     Past surgical hx, Family hx, Social hx all reviewed.  Current Outpatient Prescriptions on File Prior to Visit  Medication Sig  . albuterol (PROVENTIL) (2.5 MG/3ML) 0.083% nebulizer solution USE ONE VIAL IN NEBULIZER EVERY 6 HOURS AS NEEDED FOR WHEEZING OR SHORTNESS OF BREATH  . ALPRAZolam (XANAX) 0.25 MG tablet Take 0.25 mg by mouth at bedtime as needed for anxiety.  . budesonide-formoterol (SYMBICORT) 160-4.5 MCG/ACT inhaler Inhale 2 puffs into the lungs 2 (two) times daily.  Marland Kitchen. escitalopram (LEXAPRO) 5 MG tablet Take 20 mg by mouth daily.   . fluticasone (FLONASE) 50 MCG/ACT nasal spray USE TWO SPRAY(S) IN EACH NOSTRIL ONCE DAILY  . ipratropium  (ATROVENT) 0.02 % nebulizer solution Take 2.5 mLs (0.5 mg total) by nebulization every 6 (six) hours as needed for wheezing or shortness of breath. 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 ipratropium (ATROVENT) 0.02 % nebulizer solution INHALE ONE VIAL EVERY 6 HOURS AS NEEDED FOR WHEEZING OR SHORTNESS OF BREATH  . Ipratropium-Albuterol (COMBIVENT RESPIMAT) 20-100 MCG/ACT AERS respimat Inhale 1 puff into the lungs every 6 (six) hours as needed for wheezing.  . pantoprazole (PROTONIX) 40 MG tablet Take 40 mg by mouth 2 (two) times daily. Take 30-60 min before first meal of the day   No current facility-administered medications on file prior to visit.      Allergies  Allergen Reactions  . Codeine Nausea Only  . Levaquin [Levofloxacin In D5w] Hives  . Zoloft [Sertraline Hcl]     Increased anxiety    Review Of Systems:  Constitutional:   No  weight loss, night sweats,  Fevers, chills, fatigue, or  lassitude.  HEENT:   No headaches,  Difficulty swallowing,  Tooth/dental problems, or  Sore throat,                No sneezing, itching, ear ache, nasal congestion, post nasal drip, hoarse voice  CV:  No chest pain,  Orthopnea, PND, swelling in lower extremities, anasarca, dizziness, palpitations, syncope.   GI  No heartburn, indigestion, abdominal pain, nausea, vomiting, diarrhea, change in bowel habits, loss of appetite, bloody stools.   Resp: + shortness of breath with exertion not  at rest.  No excess mucus, no productive cough,  No non-productive cough,  No coughing up of blood.  No change in color of mucus.  No wheezing.  No chest wall deformity  Skin: no rash or lesions.  GU: no dysuria, change in color of urine, no urgency or frequency.  No flank pain, no hematuria   MS:  No joint pain or swelling.  No decreased range of motion.  No back pain.  Psych:  No change in mood or affect. No depression or anxiety.  No memory loss.   Vital Signs BP 118/72 (BP Location: Left Arm, Cuff Size: Normal)   Pulse 85    Ht 5\' 6"  (1.676 m)   Wt 117 lb 3.2 oz (53.2 kg)   SpO2 96%   BMI 18.92 kg/m    Physical Exam:  General- No distress,  A&Ox3 , pleasant ENT: No sinus tenderness, TM clear, pale nasal mucosa, no oral exudate,no post nasal drip, no LAN Cardiac: S1, S2, regular rate and rhythm, no murmur Chest: No wheeze/ rales/ dullness; no accessory muscle use, no nasal flaring, no sternal retractions Abd.: Soft Non-tender Ext: No clubbing cyanosis, edema Neuro:  normal strength Skin: No rashes, warm and dry Psych: normal mood and behavior   Assessment/Plan  COPD exacerbation (HCC) No exacerbations since March 2017 Plan: Flu shot in September Please continue using your Symbicort, Flonase and Combivent as you have been doing. Remember to rinse your mouth with water after use. Continue using your albuterol and atrovent as needed, as you have been doing. Continue with Pulmonary Rehab as you have been doing. Follow up with Dr. Trish MageMc Quaid in 6 months  Please contact office for sooner follow up if symptoms do not improve or worsen or seek emergency care      Heidi NgoSarah F Groce, NP 06/19/2016  1:51 PM

## 2016-06-19 NOTE — Patient Instructions (Addendum)
It is nice to meet you today. Please continue using your Symbicort, Flonase and Combivent as you have been doing. Remember to rinse your mouth with water after use. Continue using your albuterol and atrovent as needed, as you have been doing. Continue with Pulmonary Rehab as you have been doing. Follow up with Dr. Trish MageMc Quaid in 4 months  Please contact office for sooner follow up if symptoms do not improve or worsen or seek emergency care

## 2016-06-23 ENCOUNTER — Ambulatory Visit: Payer: Medicare Other

## 2016-06-30 ENCOUNTER — Ambulatory Visit: Payer: Medicare Other

## 2016-07-07 ENCOUNTER — Ambulatory Visit: Payer: Medicare Other

## 2016-07-14 ENCOUNTER — Ambulatory Visit: Payer: Medicare Other

## 2016-07-21 ENCOUNTER — Ambulatory Visit: Payer: Medicare Other

## 2016-07-24 DIAGNOSIS — S82839A Other fracture of upper and lower end of unspecified fibula, initial encounter for closed fracture: Secondary | ICD-10-CM | POA: Diagnosis not present

## 2016-07-24 DIAGNOSIS — M1990 Unspecified osteoarthritis, unspecified site: Secondary | ICD-10-CM

## 2016-07-24 DIAGNOSIS — J449 Chronic obstructive pulmonary disease, unspecified: Secondary | ICD-10-CM

## 2016-07-24 DIAGNOSIS — F10929 Alcohol use, unspecified with intoxication, unspecified: Secondary | ICD-10-CM

## 2016-07-24 DIAGNOSIS — S82402B Unspecified fracture of shaft of left fibula, initial encounter for open fracture type I or II: Secondary | ICD-10-CM

## 2016-07-24 DIAGNOSIS — S82253A Displaced comminuted fracture of shaft of unspecified tibia, initial encounter for closed fracture: Secondary | ICD-10-CM

## 2016-07-24 DIAGNOSIS — E871 Hypo-osmolality and hyponatremia: Secondary | ICD-10-CM

## 2016-07-24 DIAGNOSIS — F418 Other specified anxiety disorders: Secondary | ICD-10-CM

## 2016-07-25 DIAGNOSIS — S82253A Displaced comminuted fracture of shaft of unspecified tibia, initial encounter for closed fracture: Secondary | ICD-10-CM | POA: Diagnosis not present

## 2016-07-25 DIAGNOSIS — S82839A Other fracture of upper and lower end of unspecified fibula, initial encounter for closed fracture: Secondary | ICD-10-CM | POA: Diagnosis not present

## 2016-07-25 DIAGNOSIS — S82402B Unspecified fracture of shaft of left fibula, initial encounter for open fracture type I or II: Secondary | ICD-10-CM | POA: Diagnosis not present

## 2016-07-25 DIAGNOSIS — J449 Chronic obstructive pulmonary disease, unspecified: Secondary | ICD-10-CM | POA: Diagnosis not present

## 2016-07-26 DIAGNOSIS — S82402B Unspecified fracture of shaft of left fibula, initial encounter for open fracture type I or II: Secondary | ICD-10-CM | POA: Diagnosis not present

## 2016-07-26 DIAGNOSIS — S82839A Other fracture of upper and lower end of unspecified fibula, initial encounter for closed fracture: Secondary | ICD-10-CM | POA: Diagnosis not present

## 2016-07-26 DIAGNOSIS — J449 Chronic obstructive pulmonary disease, unspecified: Secondary | ICD-10-CM | POA: Diagnosis not present

## 2016-07-26 DIAGNOSIS — S82253A Displaced comminuted fracture of shaft of unspecified tibia, initial encounter for closed fracture: Secondary | ICD-10-CM | POA: Diagnosis not present

## 2016-07-30 HISTORY — PX: FRACTURE SURGERY: SHX138

## 2016-08-07 ENCOUNTER — Non-Acute Institutional Stay (SKILLED_NURSING_FACILITY): Payer: Medicare Other | Admitting: Internal Medicine

## 2016-08-07 ENCOUNTER — Encounter: Payer: Self-pay | Admitting: Internal Medicine

## 2016-08-07 DIAGNOSIS — E871 Hypo-osmolality and hyponatremia: Secondary | ICD-10-CM | POA: Diagnosis not present

## 2016-08-07 DIAGNOSIS — K219 Gastro-esophageal reflux disease without esophagitis: Secondary | ICD-10-CM

## 2016-08-07 DIAGNOSIS — F329 Major depressive disorder, single episode, unspecified: Secondary | ICD-10-CM | POA: Diagnosis not present

## 2016-08-07 DIAGNOSIS — R2681 Unsteadiness on feet: Secondary | ICD-10-CM | POA: Diagnosis not present

## 2016-08-07 DIAGNOSIS — J438 Other emphysema: Secondary | ICD-10-CM | POA: Diagnosis not present

## 2016-08-07 DIAGNOSIS — G250 Essential tremor: Secondary | ICD-10-CM | POA: Diagnosis not present

## 2016-08-07 DIAGNOSIS — E43 Unspecified severe protein-calorie malnutrition: Secondary | ICD-10-CM | POA: Diagnosis not present

## 2016-08-07 DIAGNOSIS — J309 Allergic rhinitis, unspecified: Secondary | ICD-10-CM | POA: Diagnosis not present

## 2016-08-07 DIAGNOSIS — S82202S Unspecified fracture of shaft of left tibia, sequela: Secondary | ICD-10-CM | POA: Diagnosis not present

## 2016-08-07 NOTE — Progress Notes (Signed)
LOCATION: [facility]  PCP: Leotis Shames, MD   Code Status: Full Code  Goals of care: Advanced Directive information Advanced Directives 01/13/2016  Does patient have an advance directive? Yes  Type of Advance Directive Healthcare Power of Attorney  Does patient want to make changes to advanced directive? No - Patient declined  Copy of advanced directive(s) in chart? Yes  Would patient like information on creating an advanced directive? -  Pre-existing out of facility DNR order (yellow form or pink MOST form) -       Extended Emergency Contact Information Primary Emergency Contact: Juhnke,Jennifer Address: [address]          [place], [place] [address] [place] of [place] Home Phone: [phone] Mobile Phone: [phone] Relation: Daughter   Allergies  Allergen Reactions  . Codeine Nausea Only  . Levaquin [Levofloxacin In D5w] Hives  . Zoloft [Sertraline Hcl]     Increased anxiety    Chief Complaint  Patient presents with  . New Admit To SNF    New Admission Visit     HPI:  Patient is a 60 y.o. female seen today for short term rehabilitation post hospital admission from 07/23/16-07/26/16 with left tibia and fibula open fracture post fall. She underwent IM nailing. She is seen in her room today with her boyfriend present.   Review of Systems:  Constitutional: Negative for fever, chills, diaphoresis.  HENT: Negative for headache, congestion, nasal discharge Eyes: Negative for blurred vision, double vision and discharge.  Respiratory: Negative for cough, shortness of breath and wheezing.   Cardiovascular: Negative for chest pain, palpitations, leg swelling.  Gastrointestinal: Negative for heartburn, nausea, vomiting, abdominal pain, constipation.  Genitourinary: Negative for dysuria and flank pain.  Musculoskeletal: Negative for back pain, fall in the facility.  Skin: Negative for itching, rash.  Neurological: Negative for  dizziness. Psychiatric/Behavioral: Negative for depression   Past Medical History:  Diagnosis Date  . COPD (chronic obstructive pulmonary disease) (HCC)   . GERD (gastroesophageal reflux disease)   . Hypotension    limiting med titration  . NSTEMI (non-ST elevated myocardial infarction) (HCC)    12/2011 with normal coronaries possibly secondary to Takotsubo (EF 30-35% by echo), occurred following two episodes of acute respiratory distress  . Takotsubo syndrome 4/13   Past Surgical History:  Procedure Laterality Date  . ABDOMINAL HYSTERECTOMY    . CARDIAC CATHETERIZATION    . LEFT HEART CATHETERIZATION WITH CORONARY ANGIOGRAM N/A 01/23/2012   Procedure: LEFT HEART CATHETERIZATION WITH CORONARY ANGIOGRAM;  Surgeon: Dolores Patty, MD;  Location: [facility] C[facility] LAB;  Service: Cardiovascular;  Laterality: N/A;  . vagina rectal repair     after childbirth   Social History:   reports that she quit smoking about 4 years ago. Her smoking use included Cigarettes. She has a 30.00 pack-year smoking history. She has never used smokeless tobacco. She reports that she drinks alcohol. She reports that she does not use drugs.  Family History  Problem Relation Age of Onset  . Heart attack Mother     deceased from massive MI at age 69  . Liver disease Father     fatty liver; living, age 79  . Stroke Brother     at age 33, living   . Hypertension Brother     living, age 78    Medications:   Medication List       Accurate as of 08/07/16 12:31 PM. Always use your most recent med list.  albuterol 108 (90 Base) MCG/ACT inhaler Commonly known as:  PROVENTIL HFA;VENTOLIN HFA Inhale 1-2 puffs into the lungs every 4 (four) hours as needed for wheezing or shortness of breath.   ALPRAZolam 0.25 MG tablet Commonly known as:  XANAX Take 0.25 mg by mouth at bedtime as needed for anxiety.   budesonide-formoterol 160-4.5 MCG/ACT inhaler Commonly known as:  SYMBICORT Inhale 2 puffs into the  lungs 2 (two) times daily.   escitalopram 5 MG tablet Commonly known as:  LEXAPRO Take 20 mg by mouth daily.   feeding supplement (PRO-STAT SUGAR FREE 64) Liqd Take 30 mLs by mouth 2 (two) times daily.   fluticasone 50 MCG/ACT nasal spray Commonly known as:  FLONASE USE TWO SPRAY(S) IN EACH NOSTRIL ONCE DAILY   ipratropium-albuterol 0.5-2.5 (3) MG/3ML Soln Commonly known as:  DUONEB Take 3 mLs by nebulization every 6 (six) hours as needed.   oxycodone 5 MG capsule Commonly known as:  OXY-IR Take 5 mg by mouth every 4 (four) hours as needed.   OXYGEN Inhale 2 L into the lungs as needed.   pantoprazole 40 MG tablet Commonly known as:  PROTONIX Take 40 mg by mouth 2 (two) times daily. Take 30-60 min before first meal of the day   propranolol 40 MG tablet Commonly known as:  INDERAL Take 40 mg by mouth 2 (two) times daily.       Immunizations: Immunization History  Administered Date(s) Administered  . Influenza Split 07/14/2013, 07/07/2014, 08/02/2015  . Influenza Whole 09/07/2006, 09/02/2008, 08/02/2009, 08/22/2010, 08/30/2012  . Pneumococcal Conjugate-13 08/13/2015  . Pneumococcal Polysaccharide-23 07/02/2006, 01/25/2012  . Td 07/31/2005     Physical Exam:  Vitals:   08/07/16 1221  BP: 100/60  Pulse: 68  Resp: 20  Temp: 98.1 F (36.7 C)  TempSrc: Oral  SpO2: 97%  Weight: 117 lb 3.2 oz (53.2 kg)  Height: 5\' 6"  (1.676 m)   Body mass index is 18.92 kg/m.  General- adult female, frail, thinl built, in no acute distress Head- normocephalic, atraumatic Nose- no nasal discharge Throat- moist mucus membrane Eyes- PERRLA, EOMI, no pallor, no icterus Neck- no cervical lymphadenopathy Cardiovascular- normal s1,s2, no murmur, no leg edema Respiratory- bilateral clear to auscultation, no wheeze, no rhonchi, no crackles, no use of accessory muscles Abdomen- bowel sounds present, soft, non tender Musculoskeletal- able to move all 4 extremities, limited left leg  ROM, cast to left leg, able to move her toes and has good capillary refill Neurological- alert and oriented to person, place and time Skin- warm and dry Psychiatry- normal mood and affect    Labs reviewed: Basic Metabolic Panel:  Recent Labs  96/04/54 0613 01/15/16 0543 01/17/16 0431  NA 127* 136 134*  K 3.7 3.7 3.7  CL 90* 101 94*  CO2 30 30 32  GLUCOSE 151* 99 173*  BUN 13 13 12   CREATININE 0.75 0.67 0.66  CALCIUM 8.5* 8.2* 8.9   Liver Function Tests: No results for input(s): AST, ALT, ALKPHOS, BILITOT, PROT, ALBUMIN in the last 8760 hours. No results for input(s): LIPASE, AMYLASE in the last 8760 hours. No results for input(s): AMMONIA in the last 8760 hours. CBC:  Recent Labs  11/30/15 1324 01/13/16 0950 01/14/16 0613  WBC 5.9 9.3 5.0  NEUTROABS  --  7.7*  --   HGB 14.0 15.1 13.5  HCT 41.5 44.5 39.5  MCV 95.4 95.0 95.1  PLT 294 235 216   Cardiac Enzymes:  Recent Labs  10/02/15 2147 11/30/15 1324 01/13/16 0950  TROPONINI <0.03 <0.03 <0.03     Assessment/Plan  Unsteady gait With left tibia and fibula fracture. Will have patient work with PT/OT as tolerated to regain strength and restore function.  Fall precautions are in place.  Left tibial fracture S/p IM nailing. Has orthopedic follow up. To complete her aspirin for DVT prophylaxis today. Continue oxyIR 5 mg q4h prn pain. Add tylenol 650 mg q6h prn pain. Will have her work with physical therapy and occupational therapy team to help with gait training and muscle strengthening exercises.fall precautions. Skin care. Encourage to be out of bed. Check cbc to rule out anemia post surgery.   Severe protein calorie malnutrition Get RD to evaluate. Continue prostat  Emphysema No recent copd exacerbation. Continue o2 at night time and during daytime on need basis. Add duoneb daily at 10:30 am (home regimen). Continue duoneb q6h prn. Continue symbicort  gerd Stable, continue protonix 40 mg bid  Essential  tremors Continue propranolol 40 mg bid  Allergic rhinitis Stable on flonase  Chronic depression Continue escitalopram and prn alprazolam  Hyponatremia Monitor bmp   Goals of care: short term rehabilitation   Labs/tests ordered: cbc, cmp  Family/ staff Communication: reviewed care plan with patient and nursing supervisor    Oneal GroutMAHIMA Aubry Tucholski, MD Internal Medicine [facility] [address] [place], [place] [address] Cell Phone (Monday-Friday 8 am - 5 pm): [phone] On Call: [phone] and follow prompts after 5 pm and on weekends Office Phone: [phone] Office Fax: [phone]

## 2016-08-09 ENCOUNTER — Other Ambulatory Visit: Payer: Self-pay | Admitting: Pulmonary Disease

## 2016-08-10 ENCOUNTER — Non-Acute Institutional Stay (SKILLED_NURSING_FACILITY): Payer: Medicare Other | Admitting: Family

## 2016-08-10 ENCOUNTER — Encounter: Payer: Self-pay | Admitting: Family

## 2016-08-10 DIAGNOSIS — R2681 Unsteadiness on feet: Secondary | ICD-10-CM | POA: Diagnosis not present

## 2016-08-10 DIAGNOSIS — K219 Gastro-esophageal reflux disease without esophagitis: Secondary | ICD-10-CM | POA: Diagnosis not present

## 2016-08-10 DIAGNOSIS — F419 Anxiety disorder, unspecified: Secondary | ICD-10-CM

## 2016-08-10 DIAGNOSIS — F32A Depression, unspecified: Secondary | ICD-10-CM

## 2016-08-10 DIAGNOSIS — F329 Major depressive disorder, single episode, unspecified: Secondary | ICD-10-CM | POA: Diagnosis not present

## 2016-08-10 DIAGNOSIS — J449 Chronic obstructive pulmonary disease, unspecified: Secondary | ICD-10-CM

## 2016-08-10 NOTE — Progress Notes (Signed)
Patient ID: Heidi SavannahCynthia M Wieland, female   DOB: 19-Oct-1956, 60 y.o.   MRN: [id number]  [facility] Room Number: 103 P Place of Service:  SNF (31)  Provider : Richarda Bladeinah Jakory Matsuo FNP-C  PCP: Leotis ShamesSingh,Jasmine, MD Patient Care Team: Leotis ShamesJasmine Singh, MD as PCP - General (Internal Medicine) Antonieta Ibaimothy J Gollan, MD as Consulting Physician (Cardiology)  Extended Emergency Contact Information Primary Emergency Contact: Soldo,Jennifer Address: [address]          [place], [place] [address] [place] Home Phone: [phone] Mobile Phone: [phone] Relation: Daughter  Code Status: Full Code  Goals of care:  Advanced Directive information Advanced Directives 08/10/2016  Does patient have an advance directive? No  Type of Advance Directive -  Does patient want to make changes to advanced directive? -  Copy of advanced directive(s) in chart? -  Would patient like information on creating an advanced directive? No - patient declined information  Pre-existing out of facility DNR order (yellow form or pink MOST form) -     Allergies  Allergen Reactions  . Codeine Nausea Only  . Levaquin [Levofloxacin In D5w] Hives  . Zoloft [Sertraline Hcl]     Increased anxiety    Chief Complaint  Patient presents with  . Discharge Note    HPI:  60 y.o. female [facility] at  [facility] for discharge home. She was here for short term rehabilitation post hospital admission from  9/24/ 2017-07/26/2016 with left tibia and fibula open fracture post fall. She underwent IM nailing. She has a medical history of COPD,GERD, NSTEMI among other conditions.She is seen in her room today.she states left leg pain under control.she was seen by Ortho 08/08/2016 recommended NWB to left LE and to follow up in 4-6 weeks. She has had unremarkable stay here in rehab.  She has worked well with PT/OT now stable for discharge home.She will be  discharged home with Home health PT/OT to continue with ROM, Exercise, Gait stability and muscle strengthening.She requires DME: FWW to allow her to maintain current level of independence with ADL's.She will also require a standard WC with Cushion, anti tippers, extended brake handles, removable elevating leg rests  to enable her to maintain current level of independence which cannot be achieved with walker or cane.She will also require a  3-1 bedside commode to safely and independently perform toileting transfer in home due to unsteady gait status post left tibia and fibula open fracture post fall post IM nailing.Home health services will be arranged by facility social worker prior to discharge. Prescription medication will be written x 1 month then patient to follow up with PCP in 1-2 weeks.  He denies any acute issues this visit. Facility staff report no new concerns.     Past Medical History:  Diagnosis Date  . COPD (chronic obstructive pulmonary disease) (HCC)   . GERD (gastroesophageal reflux disease)   . Hypotension    limiting med titration  . NSTEMI (non-ST elevated myocardial infarction) (HCC)    12/2011 with normal coronaries possibly secondary to Takotsubo (EF 30-35% by echo), occurred following two episodes of acute respiratory distress  . 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 ABDOMINAL HYSTERECTOMY    . CARDIAC CATHETERIZATION    . LEFT HEART CATHETERIZATION WITH CORONARY ANGIOGRAM N/A 01/23/2012   Procedure: LEFT HEART CATHETERIZATION WITH CORONARY ANGIOGRAM;  Surgeon: Dolores Pattyaniel R Bensimhon, MD;  Location:  [facility] CATH LAB;  Service: Cardiovascular;  Laterality: N/A;  . vagina rectal repair     after childbirth      reports that she quit smoking about 4 years ago. Her smoking use included Cigarettes. She has a 30.00 pack-year smoking history. She has never used smokeless tobacco. She reports that she drinks alcohol. She reports that she does not  use drugs. Social History   Social History  . Marital status: Single    Spouse name: N/A  . Number of children: N/A  . Years of education: N/A   Occupational History  . 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 Smoking status: Former Smoker    Packs/day: 1.00    Years: 30.00    Types: Cigarettes    Quit date: 01/21/2012  . Smokeless tobacco: Never Used  . Alcohol use Yes     Comment: 1-2 beers per week  . Drug use: No  . Sexual activity: Yes    Birth control/ protection: Surgical   Other Topics Concern  . Not on file   Social History Narrative   Ms. 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 She has two children, a daughter and son, who live in [place] area. She lives with her brother.       Allergies  Allergen Reactions  . Codeine Nausea Only  . Levaquin [Levofloxacin In D5w] Hives  . Zoloft [Sertraline Hcl]     Increased anxiety    Pertinent  Health Maintenance Due  Topic Date Due  . COLONOSCOPY  04/24/2006  . MAMMOGRAM  12/03/2011  . PAP SMEAR  07/12/2012  . INFLUENZA VACCINE  05/30/2016    Medications:   Medication List       Accurate as of 08/10/16 10:53 AM. Always use your most recent med list.          acetaminophen 325 MG tablet Commonly known as:  TYLENOL Take 650 mg by mouth every 6 (six) hours as needed for mild pain or moderate pain.   albuterol 108 (90 Base) MCG/ACT inhaler Commonly known as:  PROVENTIL HFA;VENTOLIN HFA Inhale 1-2 puffs into the lungs every 4 (four) hours as needed for wheezing or shortness of breath.   ALPRAZolam 0.25 MG tablet Commonly known as:  XANAX Take 0.25 mg by mouth at bedtime as needed for anxiety.   budesonide-formoterol 160-4.5 MCG/ACT inhaler Commonly known as:  SYMBICORT Inhale 2 puffs into the lungs 2 (two) times daily.   escitalopram 5 MG tablet Commonly known as:  LEXAPRO Take 20 mg by mouth daily.   feeding supplement (PRO-STAT SUGAR FREE 64)  Liqd Take 30 mLs by mouth 2 (two) times daily.   fluticasone 50 MCG/ACT nasal spray Commonly known as:  FLONASE USE TWO SPRAY(S) IN EACH NOSTRIL ONCE DAILY   ipratropium-albuterol 0.5-2.5 (3) MG/3ML Soln Commonly known as:  DUONEB Take 3 mLs by nebulization every 6 (six) hours as needed.   COMBIVENT RESPIMAT 20-100 MCG/ACT Aers respimat Generic drug:  Ipratropium-Albuterol INHALE ONE PUFF BY MOUTH EVERY 6 HOURS AS NEEDED FOR WHEEZING   oxycodone 5 MG capsule Commonly known as:  OXY-IR Take 5 mg by mouth every 4 (four) hours as needed.   OXYGEN Inhale 2 L into the lungs as needed.   pantoprazole 40 MG tablet Commonly known as:  PROTONIX Take 40 mg by mouth 2 (two) times daily. Take 30-60 min before first meal of the day   propranolol  40 MG tablet Commonly known as:  INDERAL Take 40 mg by mouth 2 (two) times daily.       Review of Systems  Constitutional: Negative for activity change, appetite change, chills, fatigue and fever.  HENT: Negative for congestion, rhinorrhea, sinus pain, sinus pressure, sneezing and sore throat.   Eyes: Negative.   Respiratory: Negative for cough, chest tightness, shortness of breath and wheezing.   Cardiovascular: Negative for chest pain, palpitations and leg swelling.  Gastrointestinal: Negative for abdominal distention, abdominal pain, constipation, diarrhea, nausea and vomiting.  Endocrine: Negative.   Genitourinary: Negative for dysuria, flank pain, frequency and urgency.  Musculoskeletal: Positive for gait problem.       Left leg pain under control with current regimen.   Skin: Negative for color change, pallor and rash.  Neurological: Negative for dizziness, seizures, syncope, facial asymmetry, light-headedness and headaches.  Hematological: Does not bruise/bleed easily.  Psychiatric/Behavioral: Negative for agitation, confusion, hallucinations and sleep disturbance. The patient is not nervous/anxious and is not hyperactive.      Vitals:   08/10/16 1051  BP: (!) 106/55  Pulse: 65  Resp: 18  Temp: 98.9 F (37.2 C)  TempSrc: Oral  SpO2: 92%  Weight: 117 lb 3.2 oz (53.2 kg)  Height: 5\' 6"  (1.676 m)   Body mass index is 18.92 kg/m. Physical Exam  Constitutional: She is oriented to person, place, and time. She appears well-developed and well-nourished. No distress.  HENT:  Head: Normocephalic.  Mouth/Throat: Oropharynx is clear and moist. No oropharyngeal exudate.  Eyes: Conjunctivae and EOM are normal. Pupils are equal, round, and reactive to light. Right eye exhibits no discharge. Left eye exhibits no discharge. No scleral icterus.  Neck: Normal range of motion. No JVD present. No thyromegaly present.  Cardiovascular: Normal rate, regular rhythm and intact distal pulses.  Exam reveals no gallop and no friction rub.   No murmur heard. Pulmonary/Chest: Effort normal and breath sounds normal. No respiratory distress. She has no wheezes. She has no rales.  Abdominal: Soft. Bowel sounds are normal. She exhibits no distension. There is no tenderness. There is no rebound and no guarding.  Musculoskeletal: She exhibits no edema, tenderness or deformity.  Moves x 4 extremities except LLE limited due to pain. NWB.Cast in place toes pink, warm and wiggles without any difficulties. Unsteady gait.   Lymphadenopathy:    She has no cervical adenopathy.  Neurological: She is oriented to person, place, and time.  Skin: Skin is warm and dry. No rash noted. No erythema. No pallor.  Psychiatric: She has a normal mood and affect.    Labs reviewed: Basic Metabolic Panel:  Recent Labs  16/10/96 0613 01/15/16 0543 01/17/16 0431  NA 127* 136 134*  K 3.7 3.7 3.7  CL 90* 101 94*  CO2 30 30 32  GLUCOSE 151* 99 173*  BUN 13 13 12   CREATININE 0.75 0.67 0.66  CALCIUM 8.5* 8.2* 8.9     Recent Labs  11/30/15 1324 01/13/16 0950 01/14/16 0613  WBC 5.9 9.3 5.0  NEUTROABS  --  7.7*  --   HGB 14.0 15.1 13.5  HCT  41.5 44.5 39.5  MCV 95.4 95.0 95.1  PLT 294 235 216   Cardiac Enzymes:  Recent Labs  10/02/15 2147 11/30/15 1324 01/13/16 0950  TROPONINI <0.03 <0.03 <0.03   Assessment/Plan:   Unsteady Gait  Status postshort term rehabilitation post hospital admission from  9/24/ 2017-07/26/2016 with left tibia and fibula open fracture post fall. She underwent IM nailing.NWB  to left leg. Will require DME: FWW to allow her to maintain current level of independence with ADL's.A standard WC with Cushion, anti tippers, extended brake handles, removable elevating leg rests  to enable her to maintain current level of independence which cannot be achieved with walker or cane.She will also require a  3-1 bedside commode to safely and independently perform toileting transfer in home will be discharged home with Home health PT/OT to continue with ROM, Exercise, Gait stability and muscle strengthening.continue current pain regimen wean off Narcotics as tolerated.CBC and BMP in 1-2 weeks with PCP.  COPD Breathing stable. Continue on Symbicort, Combivent Respimat , Albuterol and Duoneb.  GERD Asymptomatic. Continue on pantoprazole.CBC in 1-2 weeks with PCP.   Anxiety  Stable. Continue on Alprazolam.   Depression   Stable. Continue on Lexapro.monitor for mood changes.     Patient is being discharged with the following home health services:   -  PT/OT to continue with ROM, Exercise, Gait stability and muscle strengthening.  Patient is being discharged with the following durable medical equipment:    - FWW to allow her to maintain current level of independence with ADL's.  - A standard WC with Cushion, anti tippers, extended brake handles, removable elevating leg rests  to enable her to maintain current level of independence which cannot be achieved with walker or cane.   - A 3-1 bedside commode to safely and independently perform toileting transfer in home due to unsteady gait status post left tibia and  fibula open fracture post fall post IM nailing.  Patient has been advised to f/u with their PCP in 1-2 weeks to for a transitions of care visit.Social services at their facility was responsible for arranging this appointment. Pt was provided with adequate prescriptions of noncontrolled medications to reach the scheduled appointment.For controlled substances, a limited supply was provided as appropriate for the individual patient.  If the pt normally receives these medications from a pain clinic or has a contract with another physician, these medications should be received from that clinic or physician only).    Future labs/tests needed:  CBC, BMP in 1-2 weeks with PCP

## 2016-11-01 NOTE — Progress Notes (Signed)
This encounter was created in error - please disregard.

## 2016-11-10 ENCOUNTER — Other Ambulatory Visit: Payer: Self-pay | Admitting: Pulmonary Disease

## 2016-11-23 ENCOUNTER — Other Ambulatory Visit: Payer: Self-pay

## 2016-11-23 MED ORDER — IPRATROPIUM-ALBUTEROL 0.5-2.5 (3) MG/3ML IN SOLN: 3.0000 mL | Freq: Four times a day (QID) | RESPIRATORY_TRACT | 5 refills | Status: DC | PRN

## 2016-11-30 DIAGNOSIS — J189 Pneumonia, unspecified organism: Secondary | ICD-10-CM

## 2016-11-30 HISTORY — DX: Pneumonia, unspecified organism: J18.9

## 2016-12-02 ENCOUNTER — Inpatient Hospital Stay
Admission: EM | Admit: 2016-12-02 | Discharge: 2016-12-04 | DRG: 871 | Disposition: A | Discharge status: Home / Self Care | Payer: Medicare Other | Attending: Internal Medicine | Admitting: Internal Medicine

## 2016-12-02 ENCOUNTER — Emergency Department: Payer: Medicare Other

## 2016-12-02 DIAGNOSIS — F329 Major depressive disorder, single episode, unspecified: Secondary | ICD-10-CM | POA: Diagnosis present

## 2016-12-02 DIAGNOSIS — Z888 Allergy status to other drugs, medicaments and biological substances status: Secondary | ICD-10-CM | POA: Diagnosis not present

## 2016-12-02 DIAGNOSIS — Z9981 Dependence on supplemental oxygen: Secondary | ICD-10-CM | POA: Diagnosis not present

## 2016-12-02 DIAGNOSIS — J441 Chronic obstructive pulmonary disease with (acute) exacerbation: Secondary | ICD-10-CM | POA: Diagnosis present

## 2016-12-02 DIAGNOSIS — K219 Gastro-esophageal reflux disease without esophagitis: Secondary | ICD-10-CM | POA: Diagnosis present

## 2016-12-02 DIAGNOSIS — Z7951 Long term (current) use of inhaled steroids: Secondary | ICD-10-CM

## 2016-12-02 DIAGNOSIS — I252 Old myocardial infarction: Secondary | ICD-10-CM | POA: Diagnosis not present

## 2016-12-02 DIAGNOSIS — A419 Sepsis, unspecified organism: Secondary | ICD-10-CM | POA: Diagnosis present

## 2016-12-02 DIAGNOSIS — Z87891 Personal history of nicotine dependence: Secondary | ICD-10-CM

## 2016-12-02 DIAGNOSIS — R000 Tachycardia, unspecified: Secondary | ICD-10-CM | POA: Diagnosis present

## 2016-12-02 DIAGNOSIS — F419 Anxiety disorder, unspecified: Secondary | ICD-10-CM | POA: Diagnosis present

## 2016-12-02 DIAGNOSIS — I959 Hypotension, unspecified: Secondary | ICD-10-CM | POA: Diagnosis present

## 2016-12-02 DIAGNOSIS — Z881 Allergy status to other antibiotic agents status: Secondary | ICD-10-CM

## 2016-12-02 DIAGNOSIS — Z79891 Long term (current) use of opiate analgesic: Secondary | ICD-10-CM | POA: Diagnosis not present

## 2016-12-02 DIAGNOSIS — J440 Chronic obstructive pulmonary disease with acute lower respiratory infection: Secondary | ICD-10-CM | POA: Diagnosis present

## 2016-12-02 DIAGNOSIS — I5181 Takotsubo syndrome: Secondary | ICD-10-CM | POA: Diagnosis present

## 2016-12-02 DIAGNOSIS — I2510 Atherosclerotic heart disease of native coronary artery without angina pectoris: Secondary | ICD-10-CM | POA: Diagnosis present

## 2016-12-02 DIAGNOSIS — J189 Pneumonia, unspecified organism: Secondary | ICD-10-CM | POA: Diagnosis present

## 2016-12-02 LAB — COMPREHENSIVE METABOLIC PANEL
ALK PHOS: 143 U/L — AB (ref 38–126)
ALT: 18 U/L (ref 14–54)
AST: 28 U/L (ref 15–41)
Albumin: 4 g/dL (ref 3.5–5.0)
Anion gap: 8 (ref 5–15)
BUN: 11 mg/dL (ref 6–20)
CO2: 28 mmol/L (ref 22–32)
CREATININE: 0.68 mg/dL (ref 0.44–1.00)
Calcium: 9.1 mg/dL (ref 8.9–10.3)
Chloride: 99 mmol/L — ABNORMAL LOW (ref 101–111)
Glucose, Bld: 168 mg/dL — ABNORMAL HIGH (ref 65–99)
Potassium: 3.6 mmol/L (ref 3.5–5.1)
Sodium: 135 mmol/L (ref 135–145)
Total Bilirubin: 0.6 mg/dL (ref 0.3–1.2)
Total Protein: 7.7 g/dL (ref 6.5–8.1)

## 2016-12-02 LAB — INFLUENZA PANEL BY PCR (TYPE A & B)
INFLAPCR: NEGATIVE
Influenza B By PCR: NEGATIVE

## 2016-12-02 LAB — CBC WITH DIFFERENTIAL/PLATELET
Basophils Absolute: 0 10*3/uL (ref 0–0.1)
Basophils Relative: 0 %
Eosinophils Absolute: 0 10*3/uL (ref 0–0.7)
Eosinophils Relative: 0 %
HCT: 39.4 % (ref 35.0–47.0)
HEMOGLOBIN: 13.2 g/dL (ref 12.0–16.0)
LYMPHS ABS: 1 10*3/uL (ref 1.0–3.6)
LYMPHS PCT: 9 %
MCH: 31.2 pg (ref 26.0–34.0)
MCHC: 33.6 g/dL (ref 32.0–36.0)
MCV: 92.9 fL (ref 80.0–100.0)
MONOS PCT: 7 %
Monocytes Absolute: 0.8 10*3/uL (ref 0.2–0.9)
NEUTROS ABS: 9.7 10*3/uL — AB (ref 1.4–6.5)
NEUTROS PCT: 84 %
Platelets: 240 10*3/uL (ref 150–440)
RBC: 4.24 MIL/uL (ref 3.80–5.20)
RDW: 15.2 % — ABNORMAL HIGH (ref 11.5–14.5)
WBC: 11.6 10*3/uL — ABNORMAL HIGH (ref 3.6–11.0)

## 2016-12-02 LAB — LACTIC ACID, PLASMA: Lactic Acid, Venous: 0.8 mmol/L (ref 0.5–1.9)

## 2016-12-02 LAB — TROPONIN I: Troponin I: <0.03 ng/mL (ref <0.03)

## 2016-12-02 MED ORDER — ACETAMINOPHEN 500 MG PO TABS
1000.0000 mg | ORAL_TABLET | Freq: Once | ORAL | Status: AC
  Administered 2016-12-02: 1000 mg via ORAL
  Filled 2016-12-02: qty 2

## 2016-12-02 MED ORDER — CEFTRIAXONE SODIUM-DEXTROSE 1-3.74 GM-% IV SOLR
1.0000 g | Freq: Once | INTRAVENOUS | Status: AC
  Administered 2016-12-02: 1 g via INTRAVENOUS

## 2016-12-02 MED ORDER — IPRATROPIUM-ALBUTEROL 0.5-2.5 (3) MG/3ML IN SOLN
3.0000 mL | Freq: Once | RESPIRATORY_TRACT | Status: AC
  Administered 2016-12-02: 3 mL via RESPIRATORY_TRACT
  Filled 2016-12-02: qty 3

## 2016-12-02 MED ORDER — AZITHROMYCIN 500 MG PO TABS
500.0000 mg | ORAL_TABLET | Freq: Once | ORAL | Status: AC
  Administered 2016-12-02: 500 mg via ORAL
  Filled 2016-12-02: qty 1

## 2016-12-02 MED ORDER — METHYLPREDNISOLONE SODIUM SUCC 125 MG IJ SOLR: 125.0000 mg | Freq: Once | INTRAMUSCULAR | Status: DC

## 2016-12-02 MED ORDER — SODIUM CHLORIDE 0.9 % IV BOLUS (SEPSIS)
1000.0000 mL | Freq: Once | INTRAVENOUS | Status: AC
  Administered 2016-12-02: 1000 mL via INTRAVENOUS

## 2016-12-02 MED ORDER — DEXTROSE 5 % IV SOLN: 1.0000 g | Freq: Once | INTRAVENOUS | Status: DC

## 2016-12-02 MED ORDER — CEFTRIAXONE SODIUM-DEXTROSE 1-3.74 GM-% IV SOLR
INTRAVENOUS | Status: AC
  Filled 2016-12-02: qty 50

## 2016-12-02 NOTE — ED Provider Notes (Signed)
[facility] Emergency Department Provider Note  ____________________________________________  Time seen: Approximately 8:51 PM  I have reviewed the triage vital signs and the nursing notes.   HISTORY  Chief Complaint Shortness of Breath   HPI Heidi [name] is a 61 y.o. female the history of Takotsubo cardiomyopathy and COPD on 2L [place] at night only who presents for evaluation of flulike symptoms.Patient reports 2-3 days of sore throat and today started having fever, dry cough, body aches, shortness of breath and wheezing. She has been using her inhalers at home. She does not smoke. She denies chest pain. Has receive her flu shot. She denies abdominal pain, nausea, vomiting, diarrhea. She has been using her nebulizer around the clock. At this time she reports that she feels improved after receiving 10 mg of albuterol per EMS.  Past Medical History:  Diagnosis Date  . COPD (chronic obstructive pulmonary disease) (HCC)   . GERD (gastroesophageal reflux disease)   . Hypotension    limiting med titration  . NSTEMI (non-ST elevated myocardial infarction) (HCC)    12/2011 with normal coronaries possibly secondary to Takotsubo (EF 30-35% by echo), occurred following two episodes of acute respiratory distress  . 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 Sepsis due to pneumonia (HCC) 12/02/2016  . COPD (chronic obstructive pulmonary disease) (HCC) 01/13/2016  . Near syncope 12/01/2015  . Aortic atherosclerosis (HCC) 12/01/2015  . CAP (community acquired pneumonia) 01/05/2015  . Ex-smoker 07/14/2014  . Allergic rhinitis 03/03/2014  . Anxiety 04/10/2013  . Hoarseness 10/10/2012  . Acute respiratory failure (HCC) 08/08/2012  . Acute encephalopathy 08/08/2012  . COPD with acute exacerbation (HCC) 08/08/2012  . Lactic acidosis 08/08/2012  . Chronic respiratory failure (HCC) 03/27/2012  . Takotsubo cardiomyopathy  01/31/2012  . Chest pain 01/23/2012  . TINGLING 05/26/2010  . SKIN LESION 11/08/2009  . ASCUS PAP 08/02/2009  . VAG HIGH RISK HUMAN PAPILLOMAVIRUS DNA TEST POS 08/02/2009  . DEPRESSION, CHRONIC with anxiety 06/22/2009  . MICROSCOPIC HEMATURIA 06/22/2009  . Smoking history 04/06/2009  . BRUISE 04/06/2009  . URI 10/09/2007  . HYSTERECTOMY, PARTIAL, HX OF 07/01/2007  . COPD exacerbation (HCC) 06/18/2007  . OSTEOPENIA 06/18/2007  . 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 ABDOMINAL HYSTERECTOMY    . CARDIAC CATHETERIZATION    . LEFT HEART CATHETERIZATION WITH CORONARY ANGIOGRAM N/A 01/23/2012   Procedure: LEFT HEART CATHETERIZATION WITH CORONARY ANGIOGRAM;  Surgeon: Dolores Patty, MD;  Location: [facility] CATH LAB;  Service: Cardiovascular;  Laterality: N/A;  . vagina rectal repair     after childbirth    Prior to Admission medications   Medication Sig Start Date End Date Taking? Authorizing Provider  acetaminophen (TYLENOL) 325 MG tablet Take 650 mg by mouth every 6 (six) hours as needed for mild pain or moderate pain.   Yes Historical Provider, MD  albuterol (PROVENTIL HFA;VENTOLIN HFA) 108 (90 Base) MCG/ACT inhaler Inhale 1-2 puffs into the lungs every 4 (four) hours as needed for wheezing or shortness of breath.   Yes Historical Provider, MD  ALPRAZolam Prudy Feeler) 0.25 MG tablet Take 0.25 mg by mouth at bedtime as needed for anxiety.   Yes Historical Provider, MD  COMBIVENT RESPIMAT 20-100 MCG/ACT AERS respimat INHALE ONE PUFF BY MOUTH EVERY 6 HOURS AS NEEDED FOR WHEEZING 08/10/16  Yes Lupita Leash, MD  escitalopram (LEXAPRO) 5 MG tablet Take 20 mg by  mouth daily.    Yes Historical Provider, MD  fluticasone (FLONASE) 50 MCG/ACT nasal spray USE TWO SPRAY(S) IN EACH NOSTRIL ONCE DAILY 03/13/16  Yes Lupita Leash, MD  ipratropium-albuterol (DUONEB) 0.5-2.5 (3) MG/3ML SOLN Take 3 mLs by nebulization every 6 (six) hours as needed. DX  code: J44.9 11/23/16  Yes Lupita Leash, MD  OXYGEN Inhale 2 L into the lungs as needed.   Yes Historical Provider, MD  pantoprazole (PROTONIX) 40 MG tablet Take 40 mg by mouth 2 (two) times daily. Take 30-60 min before first meal of the day 01/06/13  Yes Nyoka Cowden, MD  SYMBICORT 160-4.5 MCG/ACT inhaler INHALE TWO PUFFS BY MOUTH TWICE DAILY 11/10/16  Yes Lupita Leash, MD  oxycodone (OXY-IR) 5 MG capsule Take 5 mg by mouth every 4 (four) hours as needed.    Historical Provider, MD  propranolol (INDERAL) 40 MG tablet Take 40 mg by mouth 2 (two) times daily.    Historical Provider, MD    Allergies Codeine; Levaquin [levofloxacin in d5w]; Propranolol; and Zoloft [sertraline hcl]  Family History  Problem Relation Age of Onset  . Heart attack Mother     deceased from massive MI at age 77  . Liver disease Father     fatty liver; living, age 77  . Stroke Brother     at age 80, living   . Hypertension Brother     living, age 74    Social History Social History  Substance Use Topics  . Smoking status: Former Smoker    Packs/day: 1.00    Years: 30.00    Types: Cigarettes    Quit date: 01/21/2012  . Smokeless tobacco: Never Used  . Alcohol use Yes     Comment: 1-2 beers per week    Review of Systems  Constitutional: +fever and chills and body aches Eyes: Negative for visual changes. ENT: + sore throat. Neck: No neck pain  Cardiovascular: Negative for chest pain. Respiratory: + shortness of breath and CP Gastrointestinal: Negative for abdominal pain, vomiting or diarrhea. Genitourinary: Negative for dysuria. Musculoskeletal: Negative for back pain. Skin: Negative for rash. Neurological: Negative for headaches, weakness or numbness. Psych: No SI or HI  ____________________________________________   PHYSICAL EXAM:  VITAL SIGNS: ED Triage Vitals  Enc Vitals Group     BP 12/02/16 2016 137/70     Pulse Rate 12/02/16 2016 (!) 112     Resp 12/02/16 2016 (!) 22      Temp 12/02/16 2016 (!) 101.1 F (38.4 C)     Temp Source 12/02/16 2016 Oral     SpO2 12/02/16 2016 98 %     Weight 12/02/16 2019 118 lb (53.5 kg)     Height 12/02/16 2019 5\' 6"  (1.676 m)     Head Circumference --      Peak Flow --      Pain Score 12/02/16 2020 7     Pain Loc --      Pain Edu? --      Excl. in GC? --     Constitutional: Alert and oriented. Well appearing and in no apparent distress. HEENT:      Head: Normocephalic and atraumatic.         Eyes: Conjunctivae are normal. Sclera is non-icteric. EOMI. PERRL      Mouth/Throat: Mucous membranes are moist. Exudates seen in oropharynx      Neck: Supple with no signs of meningismus. Cardiovascular: Tachycardic with regular rhythm. No murmurs, gallops, or  rubs. 2+ symmetrical distal pulses are present in all extremities. No JVD. Respiratory: Tachypnea with mildly increased work of breathing, decreased air movement with expiratory wheezes throughout  Gastrointestinal: Soft, non tender, and non distended with positive bowel sounds. No rebound or guarding. Musculoskeletal: Nontender with normal range of motion in all extremities. No edema, cyanosis, or erythema of extremities. Neurologic: Normal speech and language. Face is symmetric. Moving all extremities. No gross focal neurologic deficits are appreciated. Skin: Skin is warm, dry and intact. No rash noted. Psychiatric: Mood and affect are normal. Speech and behavior are normal.  ____________________________________________   LABS (all labs ordered are listed, but only abnormal results are displayed)  Labs Reviewed  CBC WITH DIFFERENTIAL/PLATELET - Abnormal; Notable for the following:       Result Value   WBC 11.6 (*)    RDW 15.2 (*)    Neutro Abs 9.7 (*)    All other components within normal limits  COMPREHENSIVE METABOLIC PANEL - Abnormal; Notable for the following:    Chloride 99 (*)    Glucose, Bld 168 (*)    Alkaline Phosphatase 143 (*)    All other components  within normal limits  CULTURE, BLOOD (ROUTINE X 2)  CULTURE, BLOOD (ROUTINE X 2)  URINE CULTURE  CULTURE, GROUP A STREP (THRC)  TROPONIN I  LACTIC ACID, PLASMA  LACTIC ACID, PLASMA  INFLUENZA PANEL BY PCR (TYPE A & B)  URINALYSIS, COMPLETE (UACMP) WITH MICROSCOPIC   ____________________________________________  EKG  ED ECG REPORT I, Nita Sicklearolina Aidaly Cordner, the attending physician, personally viewed and interpreted this ECG.  Sinus tachycardia, rate of 110, normal intervals, right axis deviation, no ST elevations or depressions. ____________________________________________  RADIOLOGY  CXR:  Hyperinflation with emphysematous changes and fibrotic appearing linear opacities. No consolidation, effusion or evidence of CHF.  ____________________________________________   PROCEDURES  Procedure(s) performed: None Procedures Critical Care performed: yes  CRITICAL CARE Performed by: Nita Sicklearolina Airrion Otting  ?  Total critical care time: 35 min  Critical care time was exclusive of separately billable procedures and treating other patients.  Critical care was necessary to treat or prevent imminent or life-threatening deterioration.  Critical care was time spent personally by me on the following activities: development of treatment plan with patient and/or surrogate as well as nursing, discussions with consultants, evaluation of patient's response to treatment, examination of patient, obtaining history from patient or surrogate, ordering and performing treatments and interventions, ordering and review of laboratory studies, ordering and review of radiographic studies, pulse oximetry and re-evaluation of patient's condition.  ____________________________________________   INITIAL IMPRESSION / ASSESSMENT AND PLAN / ED COURSE   61 y.o. female the history of Takotsubo cardiomyopathy and COPD on 2L [place] at night only who presents for evaluation of flulike symptoms. Patient arrives in meet sepsis  criteria with fever of 101F, tachycardic to 112, normotensive, decreased air movement bilaterally with expiratory wheezes. Presentation concerning for pneumonia versus flu versus viral URI. We'll check flu swab, strep swab, chest x-ray, basic blood work, lactate and blood cultures. We'll give IV fluids, IV ceftriaxone and by mouth azithromycin for community-acquired pneumonia. We'll give IV fluids, DuoNeb, patient received Solu-Medrol per EMS. We'll give Tylenol for the fever.  Clinical Course as of Dec 02 2346  Sat Dec 02, 2016  2254 Chest x-ray with no infiltrate. Flu pending. Patient received duoneb, solumedrol, ceftriaxone, and azithromycin for COPD exacerbation. Will be admitted to the hospital  [CV]    Clinical Course User Index [CV] Nita Sicklearolina Olukemi Panchal, MD  Pertinent labs & imaging results that were available during my care of the patient were reviewed by me and considered in my medical decision making (see chart for details).    ____________________________________________   FINAL CLINICAL IMPRESSION(S) / ED DIAGNOSES  Final diagnoses:  Sepsis, due to unspecified organism (HCC)  COPD exacerbation (HCC)  Community acquired pneumonia, unspecified laterality      NEW MEDICATIONS STARTED DURING THIS VISIT:  New Prescriptions   No medications on file     Note:  This document was prepared using Dragon voice recognition software and may include unintentional dictation errors.    Nita Sickle, MD 12/02/16 2350

## 2016-12-02 NOTE — ED Triage Notes (Signed)
Ems states gave pt 125mg  solumedrol 10mg  albuterol and 1mg  atrovent. Pt with several day history of fever, cough, sore throat and shob. Wheezing noted. Albuterol neb in progress currently. Pt is able to speak in full sentences.

## 2016-12-02 NOTE — H&P (Signed)
History and Physical   [facility] - [facility] @ [facility] Admission History and Physical [employer], D.O.    Patient Name: Heidi [name] MR#: [id number] Date of Birth: 1956-09-01 Date of Admission: 12/02/2016  Referring MD/NP/PA: Dr. Don Perking Primary Care Physician: Leotis Shames, MD Outpatient Specialists:   Patient coming from: Home  Chief Complaint: SOB  HPI: Heidi [name] is a 61 y.o. female with a known history of home O2 dependent COPD, GERD, NSTEMI, Hypotension, Takotsubo was in a usual state of health until 2-3 days ago when she reports the onset of sore throat which has been mild however today she woke up with flulike symptoms consisting of cough, body aches, fever, generalized malaise. She also noted significant shortness of breath with wheezing that is been worsening over the course of the day and has been refractory to her home inhalers..   Otherwise there has been no change in status. Patient has been taking medication as prescribed and there has been no recent change in medication or diet.  No recent antibiotics.  There has been no recent illness, hospitalizations, travel or sick contacts.    Patient denies fevers/chills, weakness, dizziness, chest pain, shortness of breath, N/V/C/D, abdominal pain, dysuria/frequency, changes in mental status.   ED Course: In the ambulance she received 125 mg Solu-Medrol and breathing treatment. In the emergency department she received Rocephin, Zithro, duonebs, NS, Tylenol  Review of Systems:  CONSTITUTIONAL: Positive  fever/chills, fatigue, weakness, body aches. Negative weight gain/loss, headache. EYES: No blurry or double vision. ENT: No tinnitus, postnasal drip, redness. Positive soreness of the oropharynx. RESPIRATORY: Positive cough, dyspnea, wheeze.  No hemoptysis.  CARDIOVASCULAR: No chest pain, palpitations, syncope, orthopnea. No lower extremity edema.  GASTROINTESTINAL: No nausea, vomiting, abdominal pain,  diarrhea, constipation.  No hematemesis, melena or hematochezia. GENITOURINARY: No dysuria, frequency, hematuria. ENDOCRINE: No polyuria or nocturia. No heat or cold intolerance. HEMATOLOGY: No anemia, bruising, bleeding. INTEGUMENTARY: No rashes, ulcers, lesions. MUSCULOSKELETAL: No arthritis, gout, dyspnea. NEUROLOGIC: No numbness, tingling, ataxia, seizure-type activity, weakness. PSYCHIATRIC: No anxiety, depression, insomnia.   Past Medical History:  Diagnosis Date  . COPD (chronic obstructive pulmonary disease) (HCC)   . GERD (gastroesophageal reflux disease)   . Hypotension    limiting med titration  . NSTEMI (non-ST elevated myocardial infarction) (HCC)    12/2011 with normal coronaries possibly secondary to Takotsubo (EF 30-35% by echo), occurred following two episodes of acute respiratory distress  . 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 ABDOMINAL HYSTERECTOMY    . CARDIAC CATHETERIZATION    . LEFT HEART CATHETERIZATION WITH CORONARY ANGIOGRAM N/A 01/23/2012   Procedure: LEFT HEART CATHETERIZATION WITH CORONARY ANGIOGRAM;  Surgeon: Dolores Patty, MD;  Location: [facility] CATH LAB;  Service: Cardiovascular;  Laterality: N/A;  . vagina rectal repair     after childbirth     reports that she quit smoking about 4 years ago. Her smoking use included Cigarettes. She has a 30.00 pack-year smoking history. She has never used smokeless tobacco. She reports that she drinks alcohol. She reports that she does not use drugs.  Allergies  Allergen Reactions  . Codeine Nausea Only  . Levaquin [Levofloxacin In D5w] Hives  . Propranolol Other (See Comments)    dizziness  . Zoloft [Sertraline Hcl]     Increased anxiety    Family History  Problem Relation Age of Onset  . Heart attack Mother     deceased from massive MI at age 55  .  Liver disease Father     fatty liver; living, age 47  . Stroke Brother     at age 5, living   . Hypertension  Brother     living, age 34   Family history has been reviewed and confirmed with patient.   Prior to Admission medications   Medication Sig Start Date End Date Taking? Authorizing Provider  acetaminophen (TYLENOL) 325 MG tablet Take 650 mg by mouth every 6 (six) hours as needed for mild pain or moderate pain.   Yes Historical Provider, MD  albuterol (PROVENTIL HFA;VENTOLIN HFA) 108 (90 Base) MCG/ACT inhaler Inhale 1-2 puffs into the lungs every 4 (four) hours as needed for wheezing or shortness of breath.   Yes Historical Provider, MD  ALPRAZolam Prudy Feeler) 0.25 MG tablet Take 0.25 mg by mouth at bedtime as needed for anxiety.   Yes Historical Provider, MD  COMBIVENT RESPIMAT 20-100 MCG/ACT AERS respimat INHALE ONE PUFF BY MOUTH EVERY 6 HOURS AS NEEDED FOR WHEEZING 08/10/16  Yes Lupita Leash, MD  escitalopram (LEXAPRO) 5 MG tablet Take 20 mg by mouth daily.    Yes Historical Provider, MD  fluticasone (FLONASE) 50 MCG/ACT nasal spray USE TWO SPRAY(S) IN EACH NOSTRIL ONCE DAILY 03/13/16  Yes Lupita Leash, MD  ipratropium-albuterol (DUONEB) 0.5-2.5 (3) MG/3ML SOLN Take 3 mLs by nebulization every 6 (six) hours as needed. DX code: J44.9 11/23/16  Yes Lupita Leash, MD  OXYGEN Inhale 2 L into the lungs as needed.   Yes Historical Provider, MD  pantoprazole (PROTONIX) 40 MG tablet Take 40 mg by mouth 2 (two) times daily. Take 30-60 min before first meal of the day 01/06/13  Yes Nyoka Cowden, MD  SYMBICORT 160-4.5 MCG/ACT inhaler INHALE TWO PUFFS BY MOUTH TWICE DAILY 11/10/16  Yes Lupita Leash, MD  oxycodone (OXY-IR) 5 MG capsule Take 5 mg by mouth every 4 (four) hours as needed.    Historical Provider, MD  propranolol (INDERAL) 40 MG tablet Take 40 mg by mouth 2 (two) times daily.    Historical Provider, MD    Physical Exam: Vitals:   12/02/16 2030 12/02/16 2100 12/02/16 2125 12/02/16 2200  BP: 129/70 125/73 125/73 122/61  Pulse: (!) 111 (!) 112 (!) 103 (!) 108  Resp:   18 (!) 21   Temp:      TempSrc:      SpO2: 91% 93% 99% 93%  Weight:      Height:        GENERAL: 61 y.o.-year-old female patient, well-developed, well-nourished lying in the bed in no acute distress.  Pleasant and cooperative.   HEENT: Head atraumatic, normocephalic. Pupils equal, round, reactive to light and accommodation. No scleral icterus. Extraocular muscles intact. Nares are patent. Oropharynx is clear. Mucus membranes moist. NECK: Supple, full range of motion. No JVD, no bruit heard. No thyroid enlargement, no tenderness, no cervical lymphadenopathy. CHEST: Mild diffuse expiratory wheezes No use of accessory muscles of respiration.  No reproducible chest wall tenderness.  CARDIOVASCULAR: S1, S2 normal. No murmurs, rubs, or gallops. Cap refill <2 seconds. Pulses intact distally.  ABDOMEN: Soft, nondistended, nontender. No rebound, guarding, rigidity. Normoactive bowel sounds present in all four quadrants. No organomegaly or mass. EXTREMITIES: No pedal edema, cyanosis, or clubbing. No calf tenderness or Homan's sign.  NEUROLOGIC: The patient is alert and oriented x 3. Cranial nerves II through XII are grossly intact with no focal sensorimotor deficit. Muscle strength 5/5 in all extremities. Sensation intact. Gait not checked. PSYCHIATRIC:  Normal affect, mood, thought content. SKIN: Warm, dry, and intact without obvious rash, lesion, or ulcer.    Labs on Admission:  CBC:  Recent Labs Lab 12/02/16 2031  WBC 11.6*  NEUTROABS 9.7*  HGB 13.2  HCT 39.4  MCV 92.9  PLT 240   Basic Metabolic Panel:  Recent Labs Lab 12/02/16 2031  NA 135  K 3.6  CL 99*  CO2 28  GLUCOSE 168*  BUN 11  CREATININE 0.68  CALCIUM 9.1   GFR: Estimated Creatinine Clearance: 63.2 mL/min (by C-G formula based on SCr of 0.68 mg/dL). Liver Function Tests:  Recent Labs Lab 12/02/16 2031  AST 28  ALT 18  ALKPHOS 143*  BILITOT 0.6  PROT 7.7  ALBUMIN 4.0   No results for input(s): LIPASE, AMYLASE in  the last 168 hours. No results for input(s): AMMONIA in the last 168 hours. Coagulation Profile: No results for input(s): INR, PROTIME in the last 168 hours. Cardiac Enzymes:  Recent Labs Lab 12/02/16 2031  TROPONINI <0.03   BNP (last 3 results) No results for input(s): PROBNP in the last 8760 hours. HbA1C: No results for input(s): HGBA1C in the last 72 hours. CBG: No results for input(s): GLUCAP in the last 168 hours. Lipid Profile: No results for input(s): CHOL, HDL, LDLCALC, TRIG, CHOLHDL, LDLDIRECT in the last 72 hours. Thyroid Function Tests: No results for input(s): TSH, T4TOTAL, FREET4, T3FREE, THYROIDAB in the last 72 hours. Anemia Panel: No results for input(s): VITAMINB12, FOLATE, FERRITIN, TIBC, IRON, RETICCTPCT in the last 72 hours. Urine analysis:    Component Value Date/Time   COLORURINE STRAW (A) 11/30/2015 1324   APPEARANCEUR CLEAR (A) 11/30/2015 1324   APPEARANCEUR Clear 06/29/2013 2244   LABSPEC 1.002 (L) 11/30/2015 1324   LABSPEC 1.005 06/29/2013 2244   PHURINE 6.0 11/30/2015 1324   GLUCOSEU NEGATIVE 11/30/2015 1324   GLUCOSEU Negative 06/29/2013 2244   HGBUR 1+ (A) 11/30/2015 1324   HGBUR trace-lysed 07/12/2009 1033   BILIRUBINUR NEGATIVE 11/30/2015 1324   BILIRUBINUR Negative 06/29/2013 2244   KETONESUR NEGATIVE 11/30/2015 1324   PROTEINUR NEGATIVE 11/30/2015 1324   UROBILINOGEN 0.2 08/08/2012 0107   NITRITE NEGATIVE 11/30/2015 1324   LEUKOCYTESUR TRACE (A) 11/30/2015 1324   LEUKOCYTESUR Trace 06/29/2013 2244   Sepsis Labs: @LABRCNTIP (procalcitonin:4,lacticidven:4) )No results found for this or any previous visit (from the past 240 hour(s)).   Radiological Exams on Admission: Dg Chest 2 View  Result Date: 12/02/2016 CLINICAL DATA:  Fever and cough for several days. EXAM: CHEST  2 VIEW COMPARISON:  01/18/2016 FINDINGS: Unchanged hyperinflation. Interstitial coarsening, likely chronic. No consolidation. No effusions. Upper lobe emphysematous  disease. Hilar, mediastinal and cardiac contours are unremarkable and unchanged. Pulmonary vasculature is normal. IMPRESSION: Hyperinflation with emphysematous changes and fibrotic appearing linear opacities. No consolidation, effusion or evidence of CHF. Electronically Signed   By: Ellery Plunk M.D.   On: 12/02/2016 21:25    EKG: Sinus tachycardia at 110 bpm with rightward axis and nonspecific ST-T wave changes.   Assessment/Plan Active Problems:   * No active hospital problems. *    This is a 61 y.o. female with a history of O2 dependent COPD, GERD, NSTEMI, Hypotension, Takotsubo  now being admitted with:  1. Sepsis presumed secondary to 2/2  CAP given symptoms - Admit to inpatient with telemetry monitoring - IV antibiotics: Rocephin, Azithro - IV fluid hydration - Follow up blood,urine & sputum cultures - Repeat CBC in am.   2. H/o COPD likely with acute exacerbation - IV  steroids - Nebulizers, O2 therapy and expectorants as needed.  - Continue Symbicort, Flonase - Continuous pulse oximetry - Consider pulmonary consult if not improving.   3. H/o CAD s/p NSTEMI/ takotsubo - Continue Inderal  4. H/o GERD - Continue Protonix  5. H/o hypotension  6. H/o depression/anxiety - Continue Lexapro, Xanax   Admission status: Inpatient IV Fluids: IVNS Diet/Nutrition: HH Consults called: None  DVT Px: Lovenox, SCDs and early ambulation. Code Status: Full Code  Disposition Plan: To home in 2-3 days   All the records are reviewed and case discussed with ED provider. Management plans discussed with the patient and/or family who express understanding and agree with plan of care.  Rally Ouch D.O. on 12/02/2016 at 11:18 PM Between 7am to 6pm - Pager - [phone] After 6pm go to [email or web address] - Social research officer, governmentpassword EPAS [facility] [place] [facility] Office [phone] CC: Primary care physician; Leotis ShamesSingh,Jasmine, MD   12/02/2016, 11:18 PM

## 2016-12-03 LAB — COMPREHENSIVE METABOLIC PANEL
ALBUMIN: 3.9 g/dL (ref 3.5–5.0)
ALK PHOS: 133 U/L — AB (ref 38–126)
ALT: 17 U/L (ref 14–54)
ANION GAP: 8 (ref 5–15)
AST: 34 U/L (ref 15–41)
BILIRUBIN TOTAL: 0.3 mg/dL (ref 0.3–1.2)
BUN: 8 mg/dL (ref 6–20)
CO2: 26 mmol/L (ref 22–32)
Calcium: 8.8 mg/dL — ABNORMAL LOW (ref 8.9–10.3)
Chloride: 102 mmol/L (ref 101–111)
Creatinine, Ser: 0.69 mg/dL (ref 0.44–1.00)
GLUCOSE: 198 mg/dL — AB (ref 65–99)
POTASSIUM: 3.7 mmol/L (ref 3.5–5.1)
Sodium: 136 mmol/L (ref 135–145)
Total Protein: 7.9 g/dL (ref 6.5–8.1)

## 2016-12-03 LAB — CBC WITH DIFFERENTIAL/PLATELET
BASOS PCT: 0 %
Basophils Absolute: 0 10*3/uL (ref 0–0.1)
Eosinophils Absolute: 0 10*3/uL (ref 0–0.7)
Eosinophils Relative: 0 %
HEMATOCRIT: 37.4 % (ref 35.0–47.0)
Hemoglobin: 12.5 g/dL (ref 12.0–16.0)
LYMPHS ABS: 0.2 10*3/uL — AB (ref 1.0–3.6)
Lymphocytes Relative: 2 %
MCH: 31.2 pg (ref 26.0–34.0)
MCHC: 33.4 g/dL (ref 32.0–36.0)
MCV: 93.4 fL (ref 80.0–100.0)
MONO ABS: 0.2 10*3/uL (ref 0.2–0.9)
MONOS PCT: 2 %
NEUTROS ABS: 11 10*3/uL — AB (ref 1.4–6.5)
Neutrophils Relative %: 96 %
Platelets: 229 10*3/uL (ref 150–440)
RBC: 4 MIL/uL (ref 3.80–5.20)
RDW: 15.3 % — AB (ref 11.5–14.5)
WBC: 11.4 10*3/uL — ABNORMAL HIGH (ref 3.6–11.0)

## 2016-12-03 LAB — APTT: aPTT: 33 seconds (ref 24–36)

## 2016-12-03 LAB — URINALYSIS, COMPLETE (UACMP) WITH MICROSCOPIC
BILIRUBIN URINE: NEGATIVE
Glucose, UA: 150 mg/dL — AB
KETONES UR: NEGATIVE mg/dL
LEUKOCYTES UA: NEGATIVE
NITRITE: NEGATIVE
Protein, ur: NEGATIVE mg/dL
SPECIFIC GRAVITY, URINE: 1.006 (ref 1.005–1.030)
pH: 5 (ref 5.0–8.0)

## 2016-12-03 LAB — LACTIC ACID, PLASMA
LACTIC ACID, VENOUS: 2.1 mmol/L — AB (ref 0.5–1.9)
LACTIC ACID, VENOUS: 1.2 mmol/L (ref 0.5–1.9)

## 2016-12-03 LAB — STREP PNEUMONIAE URINARY ANTIGEN: Strep Pneumo Urinary Antigen: NEGATIVE

## 2016-12-03 LAB — PROTIME-INR
INR: 0.96
Prothrombin Time: 12.8 seconds (ref 11.4–15.2)

## 2016-12-03 LAB — PROCALCITONIN: PROCALCITONIN: 0.41 ng/mL

## 2016-12-03 MED ORDER — FLUTICASONE PROPIONATE 50 MCG/ACT NA SUSP
1.0000 | Freq: Every day | NASAL | Status: DC
  Administered 2016-12-03 – 2016-12-04 (×2): 1 via NASAL
  Filled 2016-12-03: qty 16

## 2016-12-03 MED ORDER — SODIUM CHLORIDE 0.9 % IV BOLUS (SEPSIS)
1000.0000 mL | Freq: Once | INTRAVENOUS | Status: AC
  Administered 2016-12-03: 03:00:00 1000 mL via INTRAVENOUS

## 2016-12-03 MED ORDER — DM-GUAIFENESIN ER 30-600 MG PO TB12: 1.0000 | ORAL_TABLET | Freq: Two times a day (BID) | ORAL | Status: DC

## 2016-12-03 MED ORDER — METHYLPREDNISOLONE SODIUM SUCC 125 MG IJ SOLR
60.0000 mg | Freq: Four times a day (QID) | INTRAMUSCULAR | Status: DC
  Administered 2016-12-03 – 2016-12-04 (×7): 60 mg via INTRAVENOUS
  Filled 2016-12-03 (×7): qty 2

## 2016-12-03 MED ORDER — SODIUM CHLORIDE 0.9 % IV SOLN
INTRAVENOUS | Status: DC
  Administered 2016-12-03: 02:00:00 via INTRAVENOUS

## 2016-12-03 MED ORDER — ENOXAPARIN SODIUM 40 MG/0.4ML ~~LOC~~ SOLN
40.0000 mg | Freq: Every day | SUBCUTANEOUS | Status: DC
  Administered 2016-12-03 (×2): 40 mg via SUBCUTANEOUS
  Filled 2016-12-03 (×2): qty 0.4

## 2016-12-03 MED ORDER — DEXTROSE 5 % IV SOLN: 1.0000 g | INTRAVENOUS | Status: DC

## 2016-12-03 MED ORDER — CEFTRIAXONE SODIUM-DEXTROSE 1-3.74 GM-% IV SOLR
1.0000 g | INTRAVENOUS | Status: DC
  Administered 2016-12-03: 20:00:00 1 g via INTRAVENOUS
  Filled 2016-12-03: qty 50

## 2016-12-03 MED ORDER — GUAIFENESIN 100 MG/5ML PO SOLN
5.0000 mL | ORAL | Status: DC | PRN
  Filled 2016-12-03: qty 5

## 2016-12-03 MED ORDER — DEXTROMETHORPHAN POLISTIREX ER 30 MG/5ML PO SUER
30.0000 mg | Freq: Two times a day (BID) | ORAL | Status: DC
  Administered 2016-12-03 – 2016-12-04 (×4): 30 mg via ORAL
  Filled 2016-12-03 (×5): qty 5

## 2016-12-03 MED ORDER — PROPRANOLOL HCL 20 MG PO TABS: 40.0000 mg | ORAL_TABLET | Freq: Two times a day (BID) | ORAL | Status: DC

## 2016-12-03 MED ORDER — ZOLPIDEM TARTRATE 5 MG PO TABS
5.0000 mg | ORAL_TABLET | Freq: Every evening | ORAL | Status: DC | PRN
  Administered 2016-12-03 (×2): 5 mg via ORAL
  Filled 2016-12-03 (×2): qty 1

## 2016-12-03 MED ORDER — IPRATROPIUM-ALBUTEROL 0.5-2.5 (3) MG/3ML IN SOLN
3.0000 mL | Freq: Four times a day (QID) | RESPIRATORY_TRACT | Status: DC
  Administered 2016-12-03 – 2016-12-04 (×5): 3 mL via RESPIRATORY_TRACT
  Filled 2016-12-03 (×5): qty 3

## 2016-12-03 MED ORDER — GUAIFENESIN ER 600 MG PO TB12
600.0000 mg | ORAL_TABLET | Freq: Two times a day (BID) | ORAL | Status: DC
  Administered 2016-12-03 – 2016-12-04 (×4): 600 mg via ORAL
  Filled 2016-12-03 (×4): qty 1

## 2016-12-03 MED ORDER — DEXTROSE 5 % IV SOLN
500.0000 mg | INTRAVENOUS | Status: DC
  Administered 2016-12-03: 21:00:00 500 mg via INTRAVENOUS
  Filled 2016-12-03 (×2): qty 500

## 2016-12-03 MED ORDER — ALPRAZOLAM 0.25 MG PO TABS
0.2500 mg | ORAL_TABLET | Freq: Every evening | ORAL | Status: DC | PRN
  Administered 2016-12-03 (×2): 0.25 mg via ORAL
  Filled 2016-12-03 (×2): qty 1

## 2016-12-03 MED ORDER — PANTOPRAZOLE SODIUM 40 MG PO TBEC
40.0000 mg | DELAYED_RELEASE_TABLET | Freq: Two times a day (BID) | ORAL | Status: DC
  Administered 2016-12-03 – 2016-12-04 (×3): 40 mg via ORAL
  Filled 2016-12-03 (×3): qty 1

## 2016-12-03 MED ORDER — ESCITALOPRAM OXALATE 10 MG PO TABS
20.0000 mg | ORAL_TABLET | Freq: Every day | ORAL | Status: DC
  Administered 2016-12-03 – 2016-12-04 (×2): 20 mg via ORAL
  Filled 2016-12-03 (×2): qty 2

## 2016-12-03 MED ORDER — MOMETASONE FURO-FORMOTEROL FUM 200-5 MCG/ACT IN AERO
2.0000 | INHALATION_SPRAY | Freq: Two times a day (BID) | RESPIRATORY_TRACT | Status: DC
  Administered 2016-12-03 – 2016-12-04 (×3): 2 via RESPIRATORY_TRACT
  Filled 2016-12-03: qty 8.8

## 2016-12-03 MED ORDER — IPRATROPIUM-ALBUTEROL 0.5-2.5 (3) MG/3ML IN SOLN: 3.0000 mL | Freq: Four times a day (QID) | RESPIRATORY_TRACT | Status: DC | PRN

## 2016-12-03 NOTE — Progress Notes (Signed)
[facility] [place] [facility]   PATIENT NAME: Heidi [name]    MR#:  [id number]  DATE OF BIRTH:  Dec 14, 1955  SUBJECTIVE:  CHIEF COMPLAINT:   Chief Complaint  Patient presents with  . Shortness of Breath   Have cough, sore throat and fever for alst 2-3 days with worsening SOB. At baseline- COPD , with chronic night time oxygen use at home. Still have coughing spells, but feels little better than yesterday.  REVIEW OF SYSTEMS:  CONSTITUTIONAL: No fever, fatigue or weakness.  EYES: No blurred or double vision.  EARS, NOSE, AND THROAT: No tinnitus or ear pain.  RESPIRATORY: positive for cough, shortness of breath, wheezing , no hemoptysis.  CARDIOVASCULAR: No chest pain, orthopnea, edema.  GASTROINTESTINAL: No nausea, vomiting, diarrhea or abdominal pain.  GENITOURINARY: No dysuria, hematuria.  ENDOCRINE: No polyuria, nocturia,  HEMATOLOGY: No anemia, easy bruising or bleeding SKIN: No rash or lesion. MUSCULOSKELETAL: No joint pain or arthritis.   NEUROLOGIC: No tingling, numbness, weakness.  PSYCHIATRY: No anxiety or depression.   ROS  DRUG ALLERGIES:   Allergies  Allergen Reactions  . Codeine Nausea Only  . Levaquin [Levofloxacin In D5w] Hives  . Propranolol Other (See Comments)    dizziness  . Zoloft [Sertraline Hcl]     Increased anxiety    VITALS:  Blood pressure 135/67, pulse (!) 107, temperature 97.5 F (36.4 C), temperature source Oral, resp. rate 20, height 5\' 6"  (1.676 m), weight 53.1 kg (117 lb), SpO2 98 %.  PHYSICAL EXAMINATION:  GENERAL:  61 y.o.-year-old patient lying in the bed with no acute distress.  EYES: Pupils equal, round, reactive to light and accommodation. No scleral icterus. Extraocular muscles intact.  HEENT: Head atraumatic, normocephalic. Oropharynx and nasopharynx clear.  NECK:  Supple, no jugular venous distention. No thyroid enlargement, no tenderness.  LUNGS: Normal breath sounds bilaterally, some wheezing, no  crepitation. No use of accessory muscles of respiration. On supplemental oxygen. CARDIOVASCULAR: S1, S2 normal. No murmurs, rubs, or gallops.  ABDOMEN: Soft, nontender, nondistended. Bowel sounds present. No organomegaly or mass.  EXTREMITIES: No pedal edema, cyanosis, or clubbing.  NEUROLOGIC: Cranial nerves II through XII are intact. Muscle strength 5/5 in all extremities. Sensation intact. Gait not checked.  PSYCHIATRIC: The patient is alert and oriented x 3.  SKIN: No obvious rash, lesion, or ulcer.   Physical Exam LABORATORY PANEL:   CBC  Recent Labs Lab 12/03/16 0111  WBC 11.4*  HGB 12.5  HCT 37.4  PLT 229   ------------------------------------------------------------------------------------------------------------------  Chemistries   Recent Labs Lab 12/03/16 0111  NA 136  K 3.7  CL 102  CO2 26  GLUCOSE 198*  BUN 8  CREATININE 0.69  CALCIUM 8.8*  AST 34  ALT 17  ALKPHOS 133*  BILITOT 0.3   ------------------------------------------------------------------------------------------------------------------  Cardiac Enzymes  Recent Labs Lab 12/02/16 2031  TROPONINI <0.03   ------------------------------------------------------------------------------------------------------------------  RADIOLOGY:  Dg Chest 2 View  Result Date: 12/02/2016 CLINICAL DATA:  Fever and cough for several days. EXAM: CHEST  2 VIEW COMPARISON:  01/18/2016 FINDINGS: Unchanged hyperinflation. Interstitial coarsening, likely chronic. No consolidation. No effusions. Upper lobe emphysematous disease. Hilar, mediastinal and cardiac contours are unremarkable and unchanged. Pulmonary vasculature is normal. IMPRESSION: Hyperinflation with emphysematous changes and fibrotic appearing linear opacities. No consolidation, effusion or evidence of CHF. Electronically Signed   By: Ellery Plunk M.D.   On: 12/02/2016 21:25    ASSESSMENT AND PLAN:   Active Problems:   Sepsis due to pneumonia  (HCC)  1. Sepsis secondary to CAP given symptoms  - IV antibiotics: Rocephin, Azithro - IV fluid hydration - Follow up blood,urine & sputum cultures - Repeat CBC in am stable.  2. H/o COPD with acute exacerbation - IV steroids, Duoneb - Nebulizers, O2 therapy and expectorants as needed.  - Continue Symbicort, Flonase - Consider pulmonary consult if not improving.   3. H/o CAD s/p NSTEMI/ takotsubo - Continue Inderal  4. H/o GERD - Continue Protonix  5. H/o hypotension  6. H/o depression/anxiety - Continue Lexapro, Xanax     All the records are reviewed and case discussed with Care Management/Social Workerr. Management plans discussed with the patient, family and they are in agreement.  CODE STATUS: Full.  TOTAL TIME TAKING CARE OF THIS PATIENT: 35 minutes.     POSSIBLE D/C IN 1-2 DAYS, DEPENDING ON CLINICAL CONDITION.   Altamese DillingVACHHANI, Allura Doepke M.D on 12/03/2016   Between 7am to 6pm - Pager - [phone]  After 6pm go to [email or web address] - password [email or web address]  [facility]  Office  [phone]  CC: Primary care physician; Leotis ShamesSingh,Jasmine, MD  Note: This dictation was prepared with Dragon dictation along with smaller phrase technology. Any transcriptional errors that result from this process are unintentional.

## 2016-12-03 NOTE — ED Notes (Signed)
Pt. States having sore throat, aches, cough and shortness of breath for the past couple days.

## 2016-12-03 NOTE — Plan of Care (Signed)
Problem: Education: Goal: Knowledge of [facility] Education information/materials will improve Outcome: Progressing Pt likes to be called Heidi [name]  Past Medical History:  Diagnosis Date  . COPD (chronic obstructive pulmonary disease) (HCC)   . GERD (gastroesophageal reflux disease)   . Hypotension    limiting med titration  . NSTEMI (non-ST elevated myocardial infarction) (HCC)    12/2011 with normal coronaries possibly secondary to Takotsubo (EF 30-35% by echo), occurred following two episodes of acute respiratory distress  . Takotsubo syndrome 4/13   Pt is well controlled with home medications

## 2016-12-03 NOTE — ED Notes (Signed)
Pt transported to room 124 

## 2016-12-03 NOTE — ED Notes (Signed)
Admitting doctor in with patient.  

## 2016-12-03 NOTE — ED Notes (Signed)
Pt. States husband had a cold for about 5 days.

## 2016-12-04 LAB — URINE CULTURE: Culture: <10000 — AB

## 2016-12-04 LAB — LEGIONELLA PNEUMOPHILA SEROGP 1 UR AG: L. pneumophila Serogp 1 Ur Ag: NEGATIVE

## 2016-12-04 LAB — GLUCOSE, CAPILLARY: Glucose-Capillary: 130 mg/dL — ABNORMAL HIGH (ref 65–99)

## 2016-12-04 MED ORDER — CEFUROXIME AXETIL 250 MG PO TABS: 250.0000 mg | ORAL_TABLET | Freq: Two times a day (BID) | ORAL | 0 refills | Status: AC

## 2016-12-04 MED ORDER — PREDNISONE 10 MG (21) PO TBPK: ORAL_TABLET | ORAL | 0 refills | Status: DC

## 2016-12-04 MED ORDER — AZITHROMYCIN 250 MG PO TABS: 250.0000 mg | ORAL_TABLET | Freq: Every day | ORAL | 0 refills | Status: AC

## 2016-12-04 MED ORDER — GUAIFENESIN-DM 100-10 MG/5ML PO SYRP: 5.0000 mL | ORAL_SOLUTION | Freq: Four times a day (QID) | ORAL | 0 refills | Status: DC | PRN

## 2016-12-04 NOTE — Discharge Instructions (Addendum)
Community-Acquired Pneumonia, Adult °Introduction °Pneumonia is an infection of the lungs. One type of pneumonia can happen while a person is in a hospital. A different type can happen when a person is not in a hospital (community-acquired pneumonia). It is easy for this kind to spread from person to person. It can spread to you if you breathe near an infected person who coughs or sneezes. Some symptoms include: °· A dry cough. °· A wet (productive) cough. °· Fever. °· Sweating. °· Chest pain. °Follow these instructions at home: °· Take over-the-counter and prescription medicines only as told by your doctor. °¨ Only take cough medicine if you are losing sleep. °¨ If you were prescribed an antibiotic medicine, take it as told by your doctor. Do not stop taking the antibiotic even if you start to feel better. °· Sleep with your head and neck raised (elevated). You can do this by putting a few pillows under your head, or you can sleep in a recliner. °· Do not use tobacco products. These include cigarettes, chewing tobacco, and e-cigarettes. If you need help quitting, ask your doctor. °· Drink enough water to keep your pee (urine) clear or pale yellow. °A shot (vaccine) can help prevent pneumonia. Shots are often suggested for: °· People older than 61 years of age. °· People older than 61 years of age: °¨ Who are having cancer treatment. °¨ Who have long-term (chronic) lung disease. °¨ Who have problems with their body's defense system (immune system). °You may also prevent pneumonia if you take these actions: °· Get the flu (influenza) shot every year. °· Go to the dentist as often as told. °· Wash your hands often. If soap and water are not available, use hand sanitizer. °Contact a doctor if: °· You have a fever. °· You lose sleep because your cough medicine does not help. °Get help right away if: °· You are short of breath and it gets worse. °· You have more chest pain. °· Your sickness gets worse. This is very  serious if: °¨ You are an older adult. °¨ Your body's defense system is weak. °· You cough up blood. °This information is not intended to replace advice given to you by your health care provider. Make sure you discuss any questions you have with your health care provider. °Document Released: 04/03/2008 Document Revised: 03/23/2016 Document Reviewed: 02/10/2015 °© 2017 Elsevier ° °

## 2016-12-04 NOTE — Plan of Care (Signed)
MD making rounds. Received order to discharge home. IV removed. Patient provided with Education Handout. Prescriptions given to patient. Discharge paperwork provided, explained, signed and witnessed. No questions left unanswered. Discharged via wheelchair by auxiliary staff. Belongings sent with patient and family.

## 2016-12-04 NOTE — Care Management (Signed)
Admitted to this facility with the diagnosis of sepsis/pneumonia. Lives with boyfriend, Lesli AlbeeJames Paul [phone]), Daughter is Victorino DikeJennifer [phone]). Seen Dr, Leotis ShamesJasmine Singh on Monday of last week. Home health er [facility] and [facility] in the past. [facility] last August following compound fracture. Home oxygen per [facility] in 2013. Weaned off home oxygen but resumed home oxygen about a year ago. Wears at night 2-3 liters per nasal cannula only. Nebulizer, rolling walker, and cane in the home. [facility] on [address]. Last fall was August 017. Decreased appetite. Lost 10 pounds x 1 year. Takes care of all basic activities of daily living herself, drives. Boyfriend will transport, Gwenette GreetBrenda S Amaliya Whitelaw RN MSN CCM Care Management

## 2016-12-04 NOTE — Care Management Important Message (Signed)
Important Message  Patient Details  Name: Heidi [name] MRN: [id number] Date of Birth: 18-Jun-1956   Medicare Important Message Given:  Yes    Gwenette GreetBrenda S Marjorie Deprey, RN 12/04/2016, 11:33 AM

## 2016-12-04 NOTE — Discharge Summary (Addendum)
[facility] [place] at [facility]   PATIENT NAME: Heidi [name]    MR#:  [id number]  DATE OF BIRTH:  1956/03/19  DATE OF ADMISSION:  12/02/2016 ADMITTING PHYSICIAN: Tonye Royalty, DO  DATE OF DISCHARGE: 12/04/2016  PRIMARY CARE PHYSICIAN: Singh,Jasmine, MD    ADMISSION DIAGNOSIS:  COPD exacerbation (HCC) [J44.1] Sepsis, due to unspecified organism (HCC) [A41.9] Community acquired pneumonia, unspecified laterality [J18.9]  DISCHARGE DIAGNOSIS:  Active Problems:   Sepsis due to pneumonia (HCC)   SECONDARY DIAGNOSIS:   Past Medical History:  Diagnosis Date  . COPD (chronic obstructive pulmonary disease) (HCC)   . GERD (gastroesophageal reflux disease)   . Hypotension    limiting med titration  . NSTEMI (non-ST elevated myocardial infarction) (HCC)    12/2011 with normal coronaries possibly secondary to Takotsubo (EF 30-35% by echo), occurred following two episodes of acute respiratory distress  . Takotsubo syndrome 4/13    HOSPITAL COURSE:   1. Sepsis secondary toCAPgiven symptoms  - IV antibiotics: Rocephin, Azithro - IV fluid hydration - Follow up blood,urine & sputum cultures - Repeat CBC in am stable. - Felt better- switch to oral Abx on discharge.  2. H/o COPD with acute exacerbation - IV steroids, Duoneb - Nebulizers, O2 therapy and expectorants as needed.  - Continue Symbicort, Flonase - She have home oxygen at night, and a nebulizer at home. - Give oral tapering steroids.  3. H/o CAD s/p NSTEMI/ takotsubo - Continue Inderal  4. H/o GERD - Continue Protonix  5. H/o hypotension  6. H/o depression/anxiety - Continue Lexapro, Xanax    DISCHARGE CONDITIONS:   Stable.  CONSULTS OBTAINED:    DRUG ALLERGIES:   Allergies  Allergen Reactions  . Codeine Nausea Only  . Levaquin [Levofloxacin In D5w] Hives  . Propranolol Other (See Comments)    dizziness  . Zoloft [Sertraline Hcl]     Increased anxiety     DISCHARGE MEDICATIONS:   Current Discharge Medication List    START taking these medications   Details  azithromycin (ZITHROMAX) 250 MG tablet Take 1 tablet (250 mg total) by mouth daily. Qty: 4 each, Refills: 0    cefUROXime (CEFTIN) 250 MG tablet Take 1 tablet (250 mg total) by mouth 2 (two) times daily with a meal. Qty: 8 tablet, Refills: 0    guaiFENesin-dextromethorphan (ROBITUSSIN DM) 100-10 MG/5ML syrup Take 5 mLs by mouth every 6 (six) hours as needed for cough. Qty: 118 mL, Refills: 0    predniSONE (STERAPRED UNI-PAK 21 TAB) 10 MG (21) TBPK tablet Take 6 tabs first day, 5 tab on day 2, then 4 on day 3rd, 3 tabs on day 4th , 2 tab on day 5th, and 1 tab on 6th day. Qty: 21 tablet, Refills: 0      CONTINUE these medications which have NOT CHANGED   Details  acetaminophen (TYLENOL) 325 MG tablet Take 650 mg by mouth every 6 (six) hours as needed for mild pain or moderate pain.    albuterol (PROVENTIL HFA;VENTOLIN HFA) 108 (90 Base) MCG/ACT inhaler Inhale 1-2 puffs into the lungs every 4 (four) hours as needed for wheezing or shortness of breath.    ALPRAZolam (XANAX) 0.25 MG tablet Take 0.25 mg by mouth at bedtime as needed for anxiety.    COMBIVENT RESPIMAT 20-100 MCG/ACT AERS respimat INHALE ONE PUFF BY MOUTH EVERY 6 HOURS AS NEEDED FOR WHEEZING Qty: 1 Inhaler, Refills: 5    escitalopram (LEXAPRO) 5 MG tablet Take 20 mg by mouth  daily.     fluticasone (FLONASE) 50 MCG/ACT nasal spray USE TWO SPRAY(S) IN EACH NOSTRIL ONCE DAILY Qty: 16 g, Refills: 5    ipratropium-albuterol (DUONEB) 0.5-2.5 (3) MG/3ML SOLN Take 3 mLs by nebulization every 6 (six) hours as needed. DX code: J44.9 Qty: 360 mL, Refills: 5    OXYGEN Inhale 2 L into the lungs as needed.    pantoprazole (PROTONIX) 40 MG tablet Take 40 mg by mouth 2 (two) times daily. Take 30-60 min before first meal of the day    SYMBICORT 160-4.5 MCG/ACT inhaler INHALE TWO PUFFS BY MOUTH TWICE DAILY Qty: 1 Inhaler,  Refills: 6    oxycodone (OXY-IR) 5 MG capsule Take 5 mg by mouth every 4 (four) hours as needed.    propranolol (INDERAL) 40 MG tablet Take 40 mg by mouth 2 (two) times daily.         DISCHARGE INSTRUCTIONS:    Follow with PMD in 1-2 weeks.  If you experience worsening of your admission symptoms, develop shortness of breath, life threatening emergency, suicidal or homicidal thoughts you must seek medical attention immediately by calling 911 or calling your MD immediately  if symptoms less severe.  You Must read complete instructions/literature along with all the possible adverse reactions/side effects for all the Medicines you take and that have been prescribed to you. Take any new Medicines after you have completely understood and accept all the possible adverse reactions/side effects.   Please note  You were cared for by a hospitalist during your hospital stay. If you have any questions about your discharge medications or the care you received while you were in the hospital after you are discharged, you can call the unit and asked to speak with the hospitalist on call if the hospitalist that took care of you is not available. Once you are discharged, your primary care physician will handle any further medical issues. Please note that NO REFILLS for any discharge medications will be authorized once you are discharged, as it is imperative that you return to your primary care physician (or establish a relationship with a primary care physician if you do not have one) for your aftercare needs so that they can reassess your need for medications and monitor your lab values.    Today   CHIEF COMPLAINT:   Chief Complaint  Patient presents with  . Shortness of Breath    HISTORY OF PRESENT ILLNESS:  Heidi [name]  is a 61 y.o. female with a known history of home O2 dependent COPD, GERD, NSTEMI, Hypotension, Takotsubo was in a usual state of health until 2-3 days ago when she reports the  onset of sore throat which has been mild however today she woke up with flulike symptoms consisting of cough, body aches, fever, generalized malaise. She also noted significant shortness of breath with wheezing that is been worsening over the course of the day and has been refractory to her home inhalers..   Otherwise there has been no change in status. Patient has been taking medication as prescribed and there has been no recent change in medication or diet.  No recent antibiotics.  There has been no recent illness, hospitalizations, travel or sick contacts.    Patient denies fevers/chills, weakness, dizziness, chest pain, shortness of breath, N/V/C/D, abdominal pain, dysuria/frequency, changes in mental status.   VITAL SIGNS:  Blood pressure 122/64, pulse (!) 104, temperature 97.7 F (36.5 C), temperature source Oral, resp. rate 20, height 5\' 6"  (1.676 m), weight 53.1 kg (  117 lb), SpO2 98 %.  I/O:    Intake/Output Summary (Last 24 hours) at 12/04/16 1442 Last data filed at 12/04/16 1007  Gross per 24 hour  Intake              780 ml  Output             1000 ml  Net             -220 ml    PHYSICAL EXAMINATION:   GENERAL:  61 y.o.-year-old patient lying in the bed with no acute distress.  EYES: Pupils equal, round, reactive to light and accommodation. No scleral icterus. Extraocular muscles intact.  HEENT: Head atraumatic, normocephalic. Oropharynx and nasopharynx clear.  NECK:  Supple, no jugular venous distention. No thyroid enlargement, no tenderness.  LUNGS: Normal breath sounds bilaterally, some wheezing, no crepitation. No use of accessory muscles of respiration. On supplemental oxygen. CARDIOVASCULAR: S1, S2 normal. No murmurs, rubs, or gallops.  ABDOMEN: Soft, nontender, nondistended. Bowel sounds present. No organomegaly or mass.  EXTREMITIES: No pedal edema, cyanosis, or clubbing.  NEUROLOGIC: Cranial nerves II through XII are intact. Muscle strength 5/5 in all extremities.  Sensation intact. Gait not checked.  PSYCHIATRIC: The patient is alert and oriented x 3.  SKIN: No obvious rash, lesion, or ulcer.   DATA REVIEW:   CBC  Recent Labs Lab 12/03/16 0111  WBC 11.4*  HGB 12.5  HCT 37.4  PLT 229    Chemistries   Recent Labs Lab 12/03/16 0111  NA 136  K 3.7  CL 102  CO2 26  GLUCOSE 198*  BUN 8  CREATININE 0.69  CALCIUM 8.8*  AST 34  ALT 17  ALKPHOS 133*  BILITOT 0.3    Cardiac Enzymes  Recent Labs Lab 12/02/16 2031  TROPONINI <0.03    Microbiology Results  Results for orders placed or performed during the hospital encounter of 12/02/16  Blood Culture (routine x 2)     Status: None (Preliminary result)   Collection Time: 12/02/16  9:10 PM  Result Value Ref Range Status   Specimen Description BLOOD RIGHT HAND  Final   Special Requests BOTTLES DRAWN AEROBIC AND ANAEROBIC 3CCAERO,3CCANA  Final   Culture NO GROWTH 2 DAYS  Final   Report Status PENDING  Incomplete  Blood Culture (routine x 2)     Status: None (Preliminary result)   Collection Time: 12/02/16  9:10 PM  Result Value Ref Range Status   Specimen Description BLOOD RIGHT ARM  Final   Special Requests BOTTLES DRAWN AEROBIC AND ANAEROBIC 3CCAERO,3CCANA  Final   Culture NO GROWTH 2 DAYS  Final   Report Status PENDING  Incomplete  Culture, group A strep     Status: None (Preliminary result)   Collection Time: 12/02/16  9:10 PM  Result Value Ref Range Status   Specimen Description THROAT  Final   Special Requests NONE  Final   Culture   Final    TOO YOUNG TO READ Performed at [facility], [address] [place], [place] [address]    Report Status PENDING  Incomplete  Urine culture     Status: Abnormal   Collection Time: 12/03/16  1:01 AM  Result Value Ref Range Status   Specimen Description URINE, RANDOM  Final   Special Requests NONE  Final   Culture (A)  Final    <10,000 COLONIES/mL INSIGNIFICANT GROWTH Performed at [facility], [address], [place], [place] [address]  Report Status 12/04/2016 FINAL  Final    RADIOLOGY:  Dg Chest 2 View  Result Date: 12/02/2016 CLINICAL DATA:  Fever and cough for several days. EXAM: CHEST  2 VIEW COMPARISON:  01/18/2016 FINDINGS: Unchanged hyperinflation. Interstitial coarsening, likely chronic. No consolidation. No effusions. Upper lobe emphysematous disease. Hilar, mediastinal and cardiac contours are unremarkable and unchanged. Pulmonary vasculature is normal. IMPRESSION: Hyperinflation with emphysematous changes and fibrotic appearing linear opacities. No consolidation, effusion or evidence of CHF. Electronically Signed   By: Ellery Plunk M.D.   On: 12/02/2016 21:25    EKG:   Orders placed or performed during the hospital encounter of 12/02/16  . ED EKG  . ED EKG  . EKG 12-Lead  . EKG 12-Lead  . EKG      Management plans discussed with the patient, family and they are in agreement.  CODE STATUS:     Code Status Orders        Start     Ordered   12/03/16 0052  Full code  Continuous     12/03/16 0052    Code Status History    Date Active Date Inactive Code Status Order ID Comments User Context   01/13/2016  1:28 PM 01/19/2016  5:06 PM Full Code [id number]  Adrian Saran, MD Inpatient   04/10/2013  5:13 AM 04/10/2013  6:34 PM Full Code [id number]  Hillary Bow, DO ED   08/08/2012  2:18 AM 08/13/2012  2:16 PM Full Code [id number]  Lonia Farber, MD ED   01/31/2012  5:42 PM 02/02/2012  8:51 PM Full Code [id number]  Hollice Espy, MD ED      TOTAL TIME TAKING CARE OF THIS PATIENT: 35 minutes.    Altamese Dilling M.D on 12/04/2016 at 2:42 PM  Between 7am to 6pm - Pager - [phone]  After 6pm go to [email or web address] - password [employer]  [facility] [place] [facility]  Office  [phone]  CC: Primary care physician; Leotis Shames, MD   Note: This dictation was prepared with Dragon dictation along with smaller phrase technology. Any transcriptional errors  that result from this process are unintentional.

## 2016-12-05 LAB — CULTURE, GROUP A STREP (THRC)

## 2016-12-07 LAB — CULTURE, BLOOD (ROUTINE X 2)
Culture: NO GROWTH
Culture: NO GROWTH

## 2016-12-29 ENCOUNTER — Emergency Department (HOSPITAL_COMMUNITY)
Admission: EM | Admit: 2016-12-29 | Discharge: 2016-12-30 | Disposition: A | Discharge status: Home / Self Care | Payer: Medicare Other | Attending: Emergency Medicine | Admitting: Emergency Medicine

## 2016-12-29 ENCOUNTER — Encounter (HOSPITAL_COMMUNITY): Payer: Self-pay | Admitting: Emergency Medicine

## 2016-12-29 DIAGNOSIS — Y939 Activity, unspecified: Secondary | ICD-10-CM | POA: Diagnosis not present

## 2016-12-29 DIAGNOSIS — Z23 Encounter for immunization: Secondary | ICD-10-CM | POA: Insufficient documentation

## 2016-12-29 DIAGNOSIS — W010XXA Fall on same level from slipping, tripping and stumbling without subsequent striking against object, initial encounter: Secondary | ICD-10-CM

## 2016-12-29 DIAGNOSIS — Z87891 Personal history of nicotine dependence: Secondary | ICD-10-CM | POA: Insufficient documentation

## 2016-12-29 DIAGNOSIS — Y929 Unspecified place or not applicable: Secondary | ICD-10-CM | POA: Diagnosis not present

## 2016-12-29 DIAGNOSIS — S0181XA Laceration without foreign body of other part of head, initial encounter: Secondary | ICD-10-CM | POA: Diagnosis not present

## 2016-12-29 DIAGNOSIS — W109XXA Fall (on) (from) unspecified stairs and steps, initial encounter: Secondary | ICD-10-CM | POA: Insufficient documentation

## 2016-12-29 DIAGNOSIS — J449 Chronic obstructive pulmonary disease, unspecified: Secondary | ICD-10-CM | POA: Insufficient documentation

## 2016-12-29 DIAGNOSIS — F10920 Alcohol use, unspecified with intoxication, uncomplicated: Secondary | ICD-10-CM

## 2016-12-29 DIAGNOSIS — F10129 Alcohol abuse with intoxication, unspecified: Secondary | ICD-10-CM | POA: Insufficient documentation

## 2016-12-29 DIAGNOSIS — S0990XA Unspecified injury of head, initial encounter: Secondary | ICD-10-CM | POA: Diagnosis not present

## 2016-12-29 DIAGNOSIS — I252 Old myocardial infarction: Secondary | ICD-10-CM | POA: Diagnosis not present

## 2016-12-29 DIAGNOSIS — Y999 Unspecified external cause status: Secondary | ICD-10-CM | POA: Insufficient documentation

## 2016-12-29 MED ORDER — LIDOCAINE-EPINEPHRINE 2 %-1:100000 IJ SOLN
20.0000 mL | Freq: Once | INTRAMUSCULAR | Status: DC
  Filled 2016-12-29: qty 20

## 2016-12-29 MED ORDER — TETANUS-DIPHTH-ACELL PERTUSSIS 5-2.5-18.5 LF-MCG/0.5 IM SUSP
0.5000 mL | Freq: Once | INTRAMUSCULAR | Status: AC
  Administered 2016-12-29: 0.5 mL via INTRAMUSCULAR
  Filled 2016-12-29: qty 0.5

## 2016-12-29 NOTE — ED Triage Notes (Signed)
Per EMS, pt fell down approximately 5 steps, hitting her head on a wall. 1 in. Laceration noted to the right side of forehead. Pt reports drinking "a few beers", no LOC, c-collar in place on arrival. BP-154/93

## 2016-12-29 NOTE — ED Provider Notes (Signed)
[facility]-EMERGENCY DEPT Provider Note   CSN: [id number] Arrival date & time: 12/29/16  2338  By signing my name below, I, Cynda AcresHailei Fulton, attest that this documentation has been prepared under the direction and in the presence of Dione Boozeavid Labradford Schnitker, MD. Electronically Signed: Cynda AcresHailei Fulton, Scribe. 12/29/16. 11:52 PM.  History   Chief Complaint Chief Complaint  Patient presents with  . Fall    LEVEL 5 CAVEAT DUE TO ALTERED MENTAL STATUS  HPI Comments: Heidi [name] is a 61 y.o. female with a history of COPD on home oxygen, NSTEMI, and GERD, who presents to the Emergency Department via EMS, complaining of a mechanical fall that happened earlier tonight. Per EMS, the patient was at a concert earlier tonight when she fell down 5 steps and hit her head on a wall. Patient is intoxicated. Patient states she only drank one beer. Patient has associated laceration to the forehead. Last tetanus shot unknown. No modifying factors indicated. No blood thinner use per EMS. Patient requesting oxygen.   The history is provided by the EMS personnel. No language interpreter was used.    Past Medical History:  Diagnosis Date  . COPD (chronic obstructive pulmonary disease) (HCC)   . GERD (gastroesophageal reflux disease)   . Hypotension    limiting med titration  . NSTEMI (non-ST elevated myocardial infarction) (HCC)    12/2011 with normal coronaries possibly secondary to Takotsubo (EF 30-35% by echo), occurred following two episodes of acute respiratory distress  . 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 Sepsis due to pneumonia (HCC) 12/02/2016  . COPD (chronic obstructive pulmonary disease) (HCC) 01/13/2016  . Near syncope 12/01/2015  . Aortic atherosclerosis (HCC) 12/01/2015  . CAP (community acquired pneumonia) 01/05/2015  . Ex-smoker 07/14/2014  . Allergic rhinitis 03/03/2014  . Anxiety 04/10/2013  . Hoarseness 10/10/2012  . Acute respiratory failure (HCC)  08/08/2012  . Acute encephalopathy 08/08/2012  . COPD with acute exacerbation (HCC) 08/08/2012  . Lactic acidosis 08/08/2012  . Chronic respiratory failure (HCC) 03/27/2012  . Takotsubo cardiomyopathy 01/31/2012  . Chest pain 01/23/2012  . TINGLING 05/26/2010  . SKIN LESION 11/08/2009  . ASCUS PAP 08/02/2009  . VAG HIGH RISK HUMAN PAPILLOMAVIRUS DNA TEST POS 08/02/2009  . DEPRESSION, CHRONIC with anxiety 06/22/2009  . MICROSCOPIC HEMATURIA 06/22/2009  . Smoking history 04/06/2009  . BRUISE 04/06/2009  . URI 10/09/2007  . HYSTERECTOMY, PARTIAL, HX OF 07/01/2007  . COPD exacerbation (HCC) 06/18/2007  . OSTEOPENIA 06/18/2007  . 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 ABDOMINAL HYSTERECTOMY    . CARDIAC CATHETERIZATION    . LEFT HEART CATHETERIZATION WITH CORONARY ANGIOGRAM N/A 01/23/2012   Procedure: LEFT HEART CATHETERIZATION WITH CORONARY ANGIOGRAM;  Surgeon: Dolores Pattyaniel R Bensimhon, MD;  [facility];  Service: Cardiovascular;  Laterality: N/A;  . vagina rectal repair     after childbirth    OB History    No data available       Home Medications    Prior to Admission medications   Medication Sig Start Date End Date Taking? Authorizing Provider  acetaminophen (TYLENOL) 325 MG tablet Take 650 mg by mouth every 6 (six) hours as needed for mild pain or moderate pain.    Historical Provider, MD  albuterol (PROVENTIL HFA;VENTOLIN HFA) 108 (90 Base) MCG/ACT inhaler Inhale 1-2 puffs into the lungs every 4 (four) hours as needed for wheezing or shortness of breath.  Historical Provider, MD  ALPRAZolam Prudy Feeler) 0.25 MG tablet Take 0.25 mg by mouth at bedtime as needed for anxiety.    Historical Provider, MD  COMBIVENT RESPIMAT 20-100 MCG/ACT AERS respimat INHALE ONE PUFF BY MOUTH EVERY 6 HOURS AS NEEDED FOR WHEEZING 08/10/16   Lupita Leash, MD  escitalopram (LEXAPRO) 5 MG tablet Take 20 mg by mouth daily.     Historical  Provider, MD  fluticasone (FLONASE) 50 MCG/ACT nasal spray USE TWO SPRAY(S) IN EACH NOSTRIL ONCE DAILY 03/13/16   Lupita Leash, MD  guaiFENesin-dextromethorphan (ROBITUSSIN DM) 100-10 MG/5ML syrup Take 5 mLs by mouth every 6 (six) hours as needed for cough. 12/04/16   Altamese Dilling, MD  ipratropium-albuterol (DUONEB) 0.5-2.5 (3) MG/3ML SOLN Take 3 mLs by nebulization every 6 (six) hours as needed. DX code: J44.9 11/23/16   Lupita Leash, MD  oxycodone (OXY-IR) 5 MG capsule Take 5 mg by mouth every 4 (four) hours as needed.    Historical Provider, MD  OXYGEN Inhale 2 L into the lungs as needed.    Historical Provider, MD  pantoprazole (PROTONIX) 40 MG tablet Take 40 mg by mouth 2 (two) times daily. Take 30-60 min before first meal of the day 01/06/13   Nyoka Cowden, MD  predniSONE (STERAPRED UNI-PAK 21 TAB) 10 MG (21) TBPK tablet Take 6 tabs first day, 5 tab on day 2, then 4 on day 3rd, 3 tabs on day 4th , 2 tab on day 5th, and 1 tab on 6th day. 12/04/16   Altamese Dilling, MD  propranolol (INDERAL) 40 MG tablet Take 40 mg by mouth 2 (two) times daily.    Historical Provider, MD  SYMBICORT 160-4.5 MCG/ACT inhaler INHALE TWO PUFFS BY MOUTH TWICE DAILY 11/10/16   Lupita Leash, MD    Family History Family History  Problem Relation Age of Onset  . Heart attack Mother     deceased from massive MI at age 38  . Liver disease Father     fatty liver; living, age 58  . Stroke Brother     at age 3, living   . Hypertension Brother     living, age 68    Social History Social History  Substance Use Topics  . Smoking status: Former Smoker    Packs/day: 1.00    Years: 30.00    Types: Cigarettes    Quit date: 01/21/2012  . Smokeless tobacco: Never Used  . Alcohol use Yes     Comment: 1-2 beers per week     Allergies   Codeine; Levaquin [levofloxacin in d5w]; Propranolol; and Zoloft [sertraline hcl]   Review of Systems Review of Systems  Unable to perform ROS: Mental  status change     Physical Exam Updated Vital Signs BP 154/76 (BP Location: Right Arm)   Pulse 90   Temp 97.4 F (36.3 C) (Oral)   Resp 18   SpO2 98%   Physical Exam  Constitutional: She is oriented to person, place, and time. She appears well-developed and well-nourished.  HENT:  Head: Normocephalic.  Laceration on the left side of the forehead.   Eyes: EOM are normal. Pupils are equal, round, and reactive to light.  Neck: Normal range of motion. No JVD present.  Neck is non tender and immobilized in a stiff cervical collar.   Cardiovascular: Normal rate, regular rhythm and normal heart sounds.   No murmur heard. Pulmonary/Chest: Effort normal and breath sounds normal. She has no wheezes. She has no rales.  She exhibits no tenderness.  Abdominal: Soft. Bowel sounds are normal. She exhibits no distension and no mass. There is no tenderness.  Musculoskeletal: Normal range of motion. She exhibits no edema.  Lymphadenopathy:    She has no cervical adenopathy.  Neurological: She is alert and oriented to person, place, and time. No cranial nerve deficit. She exhibits normal muscle tone. Coordination normal.  Clinically intoxicated, speech is slow and mildly dysarthric.  Skin: Skin is warm and dry. No rash noted.  Nursing note and vitals reviewed.    ED Treatments / Results  DIAGNOSTIC STUDIES: Oxygen Saturation is 98% on RA, normal by my interpretation.    COORDINATION OF CARE: 11:52 PM Discussed treatment plan, which includes a head CT with pt at bedside and pt agreed to plan, even though she is intoxicated.    Radiology Ct Head Wo Contrast  Result Date: 12/30/2016 CLINICAL DATA:  Status post fall down 5 steps, hitting head on wall. Laceration to the right forehead. Initial encounter. EXAM: CT HEAD WITHOUT CONTRAST CT CERVICAL SPINE WITHOUT CONTRAST TECHNIQUE: Multidetector CT imaging of the head and cervical spine was performed following the standard protocol without  intravenous contrast. Multiplanar CT image reconstructions of the cervical spine were also generated. COMPARISON:  CT of the head performed 08/08/2012 FINDINGS: CT HEAD FINDINGS Brain: No evidence of acute infarction, hemorrhage, hydrocephalus, extra-axial collection or mass lesion/mass effect. The posterior fossa, including the cerebellum, brainstem and fourth ventricle, is within normal limits. The third and lateral ventricles, and basal ganglia are unremarkable in appearance. The cerebral hemispheres are symmetric in appearance, with normal gray-white differentiation. No mass effect or midline shift is seen. Vascular: No hyperdense vessel or unexpected calcification. Skull: There is no evidence of fracture; visualized osseous structures are unremarkable in appearance. Sinuses/Orbits: The visualized portions of the orbits are within normal limits. The paranasal sinuses and mastoid air cells are well-aerated. Other: Soft tissue swelling is noted overlying the left frontal calvarium. CT CERVICAL SPINE FINDINGS Alignment: Normal. Skull base and vertebrae: No acute fracture. No primary bone lesion or focal pathologic process. Soft tissues and spinal canal: No prevertebral fluid or swelling. No visible canal hematoma. Disc levels:  Intervertebral disc spaces are preserved. Upper chest: Emphysema is noted at the lung apices, with biapical scarring. The visualized portions of the thyroid gland are unremarkable. Other: No additional soft tissue abnormalities are seen. IMPRESSION: 1. No evidence of traumatic intracranial injury or fracture. 2. No evidence of fracture or subluxation along the cervical spine. 3. Soft tissue swelling overlying the left frontal calvarium. 4. Emphysema at the lung apices, with biapical scarring. Electronically Signed   By: Roanna Raider M.D.   On: 12/30/2016 00:35   Ct Cervical Spine Wo Contrast  Result Date: 12/30/2016 CLINICAL DATA:  Status post fall down 5 steps, hitting head on wall.  Laceration to the right forehead. Initial encounter. EXAM: CT HEAD WITHOUT CONTRAST CT CERVICAL SPINE WITHOUT CONTRAST TECHNIQUE: Multidetector CT imaging of the head and cervical spine was performed following the standard protocol without intravenous contrast. Multiplanar CT image reconstructions of the cervical spine were also generated. COMPARISON:  CT of the head performed 08/08/2012 FINDINGS: CT HEAD FINDINGS Brain: No evidence of acute infarction, hemorrhage, hydrocephalus, extra-axial collection or mass lesion/mass effect. The posterior fossa, including the cerebellum, brainstem and fourth ventricle, is within normal limits. The third and lateral ventricles, and basal ganglia are unremarkable in appearance. The cerebral hemispheres are symmetric in appearance, with normal gray-white differentiation. No mass effect or  midline shift is seen. Vascular: No hyperdense vessel or unexpected calcification. Skull: There is no evidence of fracture; visualized osseous structures are unremarkable in appearance. Sinuses/Orbits: The visualized portions of the orbits are within normal limits. The paranasal sinuses and mastoid air cells are well-aerated. Other: Soft tissue swelling is noted overlying the left frontal calvarium. CT CERVICAL SPINE FINDINGS Alignment: Normal. Skull base and vertebrae: No acute fracture. No primary bone lesion or focal pathologic process. Soft tissues and spinal canal: No prevertebral fluid or swelling. No visible canal hematoma. Disc levels:  Intervertebral disc spaces are preserved. Upper chest: Emphysema is noted at the lung apices, with biapical scarring. The visualized portions of the thyroid gland are unremarkable. Other: No additional soft tissue abnormalities are seen. IMPRESSION: 1. No evidence of traumatic intracranial injury or fracture. 2. No evidence of fracture or subluxation along the cervical spine. 3. Soft tissue swelling overlying the left frontal calvarium. 4. Emphysema at the  lung apices, with biapical scarring. Electronically Signed   By: Roanna Raider M.D.   On: 12/30/2016 00:35    Procedures Procedures (including critical care time) LACERATION REPAIR Performed by: VHQIO,[email or web address] Authorized by: MWUXL,KGMWN Consent: Verbal consent obtained. Risks and benefits: risks, benefits and alternatives were discussed Consent given by: patient Patient identity confirmed: provided demographic data Prepped and Draped in normal sterile fashion Wound explored  Laceration Location: Forehead  Laceration Length: 5.0 cm  No Foreign Bodies seen or palpated  Anesthesia: local infiltration  Local anesthetic: lidocaine 2% with epinephrine  Anesthetic total: 5 ml  Amount of cleaning: standard  Skin closure: Close  Number of sutures: 10  Technique: Simple interrupted with 5-0 Nyln  Patient tolerance: Patient tolerated the procedure well with no immediate complications.  Medications Ordered in ED Medications  Tdap (BOOSTRIX) injection 0.5 mL (not administered)  lidocaine-EPINEPHrine (XYLOCAINE W/EPI) 2 %-1:100000 (with pres) injection 20 mL (not administered)     Initial Impression / Assessment and Plan / ED Course  I have reviewed the triage vital signs and the nursing notes.  Pertinen imaging results that were available during my care of the patient were reviewed by me and considered in my medical decision making (see chart for details).  Fall with head injury in patient who is clinically intoxicated. Old records are reviewed, and less tenderness and is a patient was in 2006. She is being sent for CT of head and cervical spine. Tdap booster is given. She'll need suture repair of her laceration.  Final Clinical Impressions(s) / ED Diagnoses   Final diagnoses:  Fall from slip, trip, or stumble, initial encounter  Forehead laceration, initial encounter  Alcohol intoxication, uncomplicated (HCC)    New Prescriptions New Prescriptions   No medications on  file   I personally performed the services described in this documentation, which was scribed in my presence. The recorded information has been reviewed and is accurate.       Dione Booze, MD 12/30/16 [phone]

## 2016-12-30 ENCOUNTER — Emergency Department (HOSPITAL_COMMUNITY): Payer: Medicare Other

## 2016-12-30 ENCOUNTER — Other Ambulatory Visit (HOSPITAL_COMMUNITY): Payer: Self-pay

## 2016-12-30 DIAGNOSIS — S0181XA Laceration without foreign body of other part of head, initial encounter: Secondary | ICD-10-CM | POA: Diagnosis not present

## 2017-01-02 ENCOUNTER — Ambulatory Visit: Payer: Medicare Other | Admitting: Pulmonary Disease

## 2017-01-03 ENCOUNTER — Emergency Department
Admission: EM | Admit: 2017-01-03 | Discharge: 2017-01-03 | Disposition: A | Discharge status: Home / Self Care | Payer: Medicare Other | Attending: Emergency Medicine | Admitting: Emergency Medicine

## 2017-01-03 DIAGNOSIS — Z4802 Encounter for removal of sutures: Secondary | ICD-10-CM | POA: Diagnosis present

## 2017-01-03 DIAGNOSIS — Z87891 Personal history of nicotine dependence: Secondary | ICD-10-CM | POA: Insufficient documentation

## 2017-01-03 DIAGNOSIS — J449 Chronic obstructive pulmonary disease, unspecified: Secondary | ICD-10-CM | POA: Insufficient documentation

## 2017-01-03 DIAGNOSIS — Z79899 Other long term (current) drug therapy: Secondary | ICD-10-CM | POA: Insufficient documentation

## 2017-01-03 NOTE — ED Provider Notes (Signed)
[facility] Emergency Department Provider Note   ____________________________________________   First MD Initiated Contact with Patient 01/03/17 1458     (approximate)  I have reviewed the triage vital signs and the nursing notes.   HISTORY  Chief Complaint Suture / Staple Removal    HPI Heidi [name] is a 61 y.o. female she is here for suture removal secondary to laceration to forehead.Patient was seen in this facility on this second of March 2018.  Patient rates her pain as a 4/10.   Past Medical History:  Diagnosis Date  . COPD (chronic obstructive pulmonary disease) (HCC)   . GERD (gastroesophageal reflux disease)   . Hypotension    limiting med titration  . NSTEMI (non-ST elevated myocardial infarction) (HCC)    12/2011 with normal coronaries possibly secondary to Takotsubo (EF 30-35% by echo), occurred following two episodes of acute respiratory distress  . 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 Sepsis due to pneumonia (HCC) 12/02/2016  . COPD (chronic obstructive pulmonary disease) (HCC) 01/13/2016  . Near syncope 12/01/2015  . Aortic atherosclerosis (HCC) 12/01/2015  . CAP (community acquired pneumonia) 01/05/2015  . Ex-smoker 07/14/2014  . Allergic rhinitis 03/03/2014  . Anxiety 04/10/2013  . Hoarseness 10/10/2012  . Acute respiratory failure (HCC) 08/08/2012  . Acute encephalopathy 08/08/2012  . COPD with acute exacerbation (HCC) 08/08/2012  . Lactic acidosis 08/08/2012  . Chronic respiratory failure (HCC) 03/27/2012  . Takotsubo cardiomyopathy 01/31/2012  . Chest pain 01/23/2012  . TINGLING 05/26/2010  . SKIN LESION 11/08/2009  . ASCUS PAP 08/02/2009  . VAG HIGH RISK HUMAN PAPILLOMAVIRUS DNA TEST POS 08/02/2009  . DEPRESSION, CHRONIC with anxiety 06/22/2009  . MICROSCOPIC HEMATURIA 06/22/2009  . Smoking history 04/06/2009  . BRUISE 04/06/2009  . URI 10/09/2007  . HYSTERECTOMY,  PARTIAL, HX OF 07/01/2007  . COPD exacerbation (HCC) 06/18/2007  . OSTEOPENIA 06/18/2007  . 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 ABDOMINAL HYSTERECTOMY    . CARDIAC CATHETERIZATION    . LEFT HEART CATHETERIZATION WITH CORONARY ANGIOGRAM N/A 01/23/2012   Procedure: LEFT HEART CATHETERIZATION WITH CORONARY ANGIOGRAM;  Surgeon: Dolores Patty, MD;  Location: [facility] CATH LAB;  Service: Cardiovascular;  Laterality: N/A;  . vagina rectal repair     after childbirth    Prior to Admission medications   Medication Sig Start Date End Date Taking? Authorizing Provider  acetaminophen (TYLENOL) 325 MG tablet Take 650 mg by mouth every 6 (six) hours as needed for mild pain or moderate pain.    Historical Provider, MD  albuterol (PROVENTIL HFA;VENTOLIN HFA) 108 (90 Base) MCG/ACT inhaler Inhale 1-2 puffs into the lungs every 4 (four) hours as needed for wheezing or shortness of breath.    Historical Provider, MD  ALPRAZolam Prudy Feeler) 0.25 MG tablet Take 0.25 mg by mouth at bedtime as needed for anxiety.    Historical Provider, MD  COMBIVENT RESPIMAT 20-100 MCG/ACT AERS respimat INHALE ONE PUFF BY MOUTH EVERY 6 HOURS AS NEEDED FOR WHEEZING 08/10/16   Lupita Leash, MD  escitalopram (LEXAPRO) 5 MG tablet Take 20 mg by mouth daily.     Historical Provider, MD  fluticasone (FLONASE) 50 MCG/ACT nasal spray USE TWO SPRAY(S) IN EACH NOSTRIL ONCE DAILY 03/13/16   Lupita Leash, MD  guaiFENesin-dextromethorphan (ROBITUSSIN DM) 100-10 MG/5ML syrup Take 5 mLs by mouth every 6 (six) hours as needed for cough.  12/04/16   Altamese DillingVaibhavkumar Vachhani, MD  ipratropium-albuterol (DUONEB) 0.5-2.5 (3) MG/3ML SOLN Take 3 mLs by nebulization every 6 (six) hours as needed. DX code: J44.9 11/23/16   Lupita Leashouglas B McQuaid, MD  oxycodone (OXY-IR) 5 MG capsule Take 5 mg by mouth every 4 (four) hours as needed.    Historical Provider, MD  OXYGEN Inhale 2 L into the lungs as needed.     Historical Provider, MD  pantoprazole (PROTONIX) 40 MG tablet Take 40 mg by mouth 2 (two) times daily. Take 30-60 min before first meal of the day 01/06/13   Nyoka CowdenMichael B Wert, MD  predniSONE (STERAPRED UNI-PAK 21 TAB) 10 MG (21) TBPK tablet Take 6 tabs first day, 5 tab on day 2, then 4 on day 3rd, 3 tabs on day 4th , 2 tab on day 5th, and 1 tab on 6th day. 12/04/16   Altamese DillingVaibhavkumar Vachhani, MD  propranolol (INDERAL) 40 MG tablet Take 40 mg by mouth 2 (two) times daily.    Historical Provider, MD  SYMBICORT 160-4.5 MCG/ACT inhaler INHALE TWO PUFFS BY MOUTH TWICE DAILY 11/10/16   Lupita Leashouglas B McQuaid, MD    Allergies Codeine; Levaquin [levofloxacin in d5w]; Propranolol; and Zoloft [sertraline hcl]  Family History  Problem Relation Age of Onset  . Heart attack Mother     deceased from massive MI at age 61  . Liver disease Father     fatty liver; living, age 61  . Stroke Brother     at age 61, living   . Hypertension Brother     living, age 61    Social History Social History  Substance Use Topics  . Smoking status: Former Smoker    Packs/day: 1.00    Years: 30.00    Types: Cigarettes    Quit date: 01/21/2012  . Smokeless tobacco: Never Used  . Alcohol use Yes     Comment: 1-2 beers per week    Review of Systems Constitutional: No fever/chills Eyes: No visual changes. ENT: No sore throat. Cardiovascular: Denies chest pain. Respiratory: Denies shortness of breath. Gastrointestinal: No abdominal pain.  No nausea, no vomiting.  No diarrhea.  No constipation. Genitourinary: Negative for dysuria. Musculoskeletal: Negative for back pain. Skin: Negative for rash. Neurological: Negative for headaches, focal weakness or numbness. Psychiatric: Anxiety and depression  ____________________________________________   PHYSICAL EXAM:  VITAL SIGNS: ED Triage Vitals  Enc Vitals Group     BP 01/03/17 1421 140/80     Pulse Rate 01/03/17 1421 88     Resp 01/03/17 1421 18     Temp 01/03/17 1421  97.5 F (36.4 C)     Temp Source 01/03/17 1421 Oral     SpO2 01/03/17 1421 96 %     Weight 01/03/17 1420 118 lb (53.5 kg)     Height 01/03/17 1420 5\' 6"  (1.676 m)     Head Circumference --      Peak Flow --      Pain Score 01/03/17 1420 4     Pain Loc --      Pain Edu? --      Excl. in GC? --     Constitutional: Alert and oriented. Well appearing and in no acute distress. Eyes: Conjunctivae are normal. PERRL. EOMI. Head: Atraumatic. Nose: No congestion/rhinnorhea. Mouth/Throat: Mucous membranes are moist.  Oropharynx non-erythematous. Neck: No stridor.  No cervical spine tenderness to palpation. Hematological/Lymphatic/Immunilogical: No cervical lymphadenopathy. Cardiovascular: Normal rate, regular rhythm. Grossly normal heart sounds.  Good peripheral circulation. Respiratory: Normal  respiratory effort.  No retractions. Lungs CTAB. Gastrointestinal: Soft and nontender. No distention. No abdominal bruits. No CVA tenderness. Musculoskeletal: No lower extremity tenderness nor edema.  No joint effusions. Neurologic:  Normal speech and language. No gross focal neurologic deficits are appreciated. No gait instability. Skin:  Skin is warm, dry and intact. No rash noted.Healed laceration left forehead. Psychiatric: Mood and affect are normal. Speech and behavior are normal.  ____________________________________________   LABS (all labs ordered are listed, but only abnormal results are displayed)  Labs Reviewed - No data to display ____________________________________________  EKG   ____________________________________________  RADIOLOGY   ____________________________________________   PROCEDURES  Procedure(s) performed: None  Procedures  Critical Care performed: No  ____________________________________________   INITIAL IMPRESSION / ASSESSMENT AND PLAN / ED COURSE  Pertinent labs & imaging results that were available during my care of the patient were reviewed by  me and considered in my medical decision making (see chart for details).  Healed laceration to the forehead. Patient had taken sutures removed. Patient given discharge care instructions. Patient advised follow-up this facility if condition worsens.      ____________________________________________   FINAL CLINICAL IMPRESSION(S) / ED DIAGNOSES  Final diagnoses:  Encounter for removal of sutures      NEW MEDICATIONS STARTED DURING THIS VISIT:  New Prescriptions   No medications on file     Note:  This document was prepared using Dragon voice recognition software and may include unintentional dictation errors.    Joni Reining, PA-C 01/03/17 1610    Phineas Semen, MD 01/03/17 [phone]

## 2017-01-03 NOTE — ED Triage Notes (Signed)
Pt here for suture removal

## 2017-01-03 NOTE — ED Notes (Signed)
See triage note  Presents to have sutures to forehead removed  Sutures intact  No redness or drainage noted

## 2017-01-10 ENCOUNTER — Telehealth: Payer: Self-pay | Admitting: *Deleted

## 2017-01-10 NOTE — Telephone Encounter (Signed)
Left voicemail for patient notifyng them that it is time to schedule annual low dose lung cancer screening CT scan. Instructed patient to call back to verify information prior to the scan being scheduled.  

## 2017-01-11 ENCOUNTER — Telehealth: Payer: Self-pay | Admitting: *Deleted

## 2017-01-11 DIAGNOSIS — Z87891 Personal history of nicotine dependence: Secondary | ICD-10-CM

## 2017-01-11 NOTE — Telephone Encounter (Signed)
Notified patient that annual lung cancer screening low dose CT scan is due. Confirmed that patient is within the age range of 55-77, and asymptomatic, (no signs or symptoms of lung cancer). Patient denies illness that would prevent curative treatment for lung cancer if found. The patient is a former smoker, quit 01/21/12, with a 40 pack year history. The shared decision making visit was done 07/31/14. Patient is agreeable for CT scan being scheduled.

## 2017-01-15 ENCOUNTER — Emergency Department
Admission: EM | Admit: 2017-01-15 | Discharge: 2017-01-15 | Disposition: A | Discharge status: Home / Self Care | Payer: Medicare Other | Attending: Emergency Medicine | Admitting: Emergency Medicine

## 2017-01-15 ENCOUNTER — Encounter: Payer: Self-pay | Admitting: *Deleted

## 2017-01-15 DIAGNOSIS — J449 Chronic obstructive pulmonary disease, unspecified: Secondary | ICD-10-CM | POA: Diagnosis not present

## 2017-01-15 DIAGNOSIS — S0003XD Contusion of scalp, subsequent encounter: Secondary | ICD-10-CM | POA: Diagnosis not present

## 2017-01-15 DIAGNOSIS — Z87891 Personal history of nicotine dependence: Secondary | ICD-10-CM | POA: Insufficient documentation

## 2017-01-15 DIAGNOSIS — R220 Localized swelling, mass and lump, head: Secondary | ICD-10-CM | POA: Diagnosis present

## 2017-01-15 DIAGNOSIS — W1839XD Other fall on same level, subsequent encounter: Secondary | ICD-10-CM | POA: Insufficient documentation

## 2017-01-15 NOTE — ED Provider Notes (Signed)
[facility] Emergency Department Provider Note  ____________________________________________  Time seen: Approximately 3:56 PM  I have reviewed the triage vital signs and the nursing notes.   HISTORY  Chief Complaint Facial Swelling    HPI Heidi [name] is a 61 y.o. female presents to the emergency department with "knots" over left forehead after falling on head 3 weeks ago. Patient states that swelling and bruising has gotten significantly better but these spots on her forehead have not resolved. Patient states that initially both eyes were black and blue. Patient had 10 stitches placed in forehead. Patient enjoys going to the gym and would like to know when she can return to the gym. Patient is not on any blood thinners. Patient has not taken anything for symptoms. Patient denies fever, mental status changes, headache, visual changes, pain, shortness of breath, chest pain, nausea, vomiting, abdominal pain.   Past Medical History:  Diagnosis Date  . COPD (chronic obstructive pulmonary disease) (HCC)   . GERD (gastroesophageal reflux disease)   . Hypotension    limiting med titration  . NSTEMI (non-ST elevated myocardial infarction) (HCC)    12/2011 with normal coronaries possibly secondary to Takotsubo (EF 30-35% by echo), occurred following two episodes of acute respiratory distress  . 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 Sepsis due to pneumonia (HCC) 12/02/2016  . COPD (chronic obstructive pulmonary disease) (HCC) 01/13/2016  . Near syncope 12/01/2015  . Aortic atherosclerosis (HCC) 12/01/2015  . CAP (community acquired pneumonia) 01/05/2015  . Ex-smoker 07/14/2014  . Allergic rhinitis 03/03/2014  . Anxiety 04/10/2013  . Hoarseness 10/10/2012  . Acute respiratory failure (HCC) 08/08/2012  . Acute encephalopathy 08/08/2012  . COPD with acute exacerbation (HCC) 08/08/2012  . Lactic acidosis 08/08/2012   . Chronic respiratory failure (HCC) 03/27/2012  . Takotsubo cardiomyopathy 01/31/2012  . Chest pain 01/23/2012  . TINGLING 05/26/2010  . SKIN LESION 11/08/2009  . ASCUS PAP 08/02/2009  . VAG HIGH RISK HUMAN PAPILLOMAVIRUS DNA TEST POS 08/02/2009  . DEPRESSION, CHRONIC with anxiety 06/22/2009  . MICROSCOPIC HEMATURIA 06/22/2009  . Smoking history 04/06/2009  . BRUISE 04/06/2009  . URI 10/09/2007  . HYSTERECTOMY, PARTIAL, HX OF 07/01/2007  . COPD exacerbation (HCC) 06/18/2007  . OSTEOPENIA 06/18/2007  . 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 ABDOMINAL HYSTERECTOMY    . CARDIAC CATHETERIZATION    . LEFT HEART CATHETERIZATION WITH CORONARY ANGIOGRAM N/A 01/23/2012   Procedure: LEFT HEART CATHETERIZATION WITH CORONARY ANGIOGRAM;  Surgeon: Dolores Patty, MD;  Location: [facility] CATH LAB;  Service: Cardiovascular;  Laterality: N/A;  . vagina rectal repair     after childbirth    Prior to Admission medications   Medication Sig Start Date End Date Taking? Authorizing Provider  acetaminophen (TYLENOL) 325 MG tablet Take 650 mg by mouth every 6 (six) hours as needed for mild pain or moderate pain.    Historical Provider, MD  albuterol (PROVENTIL HFA;VENTOLIN HFA) 108 (90 Base) MCG/ACT inhaler Inhale 1-2 puffs into the lungs every 4 (four) hours as needed for wheezing or shortness of breath.    Historical Provider, MD  ALPRAZolam Prudy Feeler) 0.25 MG tablet Take 0.25 mg by mouth at bedtime as needed for anxiety.    Historical Provider, MD  COMBIVENT RESPIMAT 20-100 MCG/ACT AERS respimat INHALE ONE PUFF BY MOUTH EVERY 6 HOURS AS NEEDED FOR WHEEZING 08/10/16   Lupita Leash, MD  escitalopram (LEXAPRO) 5 MG tablet Take 20 mg by mouth daily.     Historical Provider, MD  fluticasone (FLONASE) 50 MCG/ACT nasal spray USE TWO SPRAY(S) IN EACH NOSTRIL ONCE DAILY 03/13/16   Lupita Leash, MD  guaiFENesin-dextromethorphan (ROBITUSSIN DM) 100-10  MG/5ML syrup Take 5 mLs by mouth every 6 (six) hours as needed for cough. 12/04/16   Altamese Dilling, MD  ipratropium-albuterol (DUONEB) 0.5-2.5 (3) MG/3ML SOLN Take 3 mLs by nebulization every 6 (six) hours as needed. DX code: J44.9 11/23/16   Lupita Leash, MD  oxycodone (OXY-IR) 5 MG capsule Take 5 mg by mouth every 4 (four) hours as needed.    Historical Provider, MD  OXYGEN Inhale 2 L into the lungs as needed.    Historical Provider, MD  pantoprazole (PROTONIX) 40 MG tablet Take 40 mg by mouth 2 (two) times daily. Take 30-60 min before first meal of the day 01/06/13   Nyoka Cowden, MD  predniSONE (STERAPRED UNI-PAK 21 TAB) 10 MG (21) TBPK tablet Take 6 tabs first day, 5 tab on day 2, then 4 on day 3rd, 3 tabs on day 4th , 2 tab on day 5th, and 1 tab on 6th day. 12/04/16   Altamese Dilling, MD  propranolol (INDERAL) 40 MG tablet Take 40 mg by mouth 2 (two) times daily.    Historical Provider, MD  SYMBICORT 160-4.5 MCG/ACT inhaler INHALE TWO PUFFS BY MOUTH TWICE DAILY 11/10/16   Lupita Leash, MD    Allergies Codeine; Levaquin [levofloxacin in d5w]; Propranolol; and Zoloft [sertraline hcl]  Family History  Problem Relation Age of Onset  . Heart attack Mother     deceased from massive MI at age 50  . Liver disease Father     fatty liver; living, age 49  . Stroke Brother     at age 64, living   . Hypertension Brother     living, age 43    Social History Social History  Substance Use Topics  . Smoking status: Former Smoker    Packs/day: 1.00    Years: 30.00    Types: Cigarettes    Quit date: 01/21/2012  . Smokeless tobacco: Never Used  . Alcohol use Yes     Comment: 1-2 beers per week     Review of Systems  Constitutional: No fever/chills ENT: No upper respiratory complaints. Cardiovascular: No chest pain. Respiratory: No cough. No SOB. Gastrointestinal: No abdominal pain.  No nausea, no vomiting.  Neurological: Negative for headaches, numbness or  tingling   ____________________________________________   PHYSICAL EXAM:  VITAL SIGNS: ED Triage Vitals  Enc Vitals Group     BP 01/15/17 1356 110/83     Pulse Rate 01/15/17 1356 79     Resp 01/15/17 1356 18     Temp 01/15/17 1356 98.1 F (36.7 C)     Temp Source 01/15/17 1356 Oral     SpO2 01/15/17 1356 100 %     Weight 01/15/17 1356 118 lb (53.5 kg)     Height 01/15/17 1356 5\' 5"  (1.651 m)     Head Circumference --      Peak Flow --      Pain Score 01/15/17 1357 2     Pain Loc --      Pain Edu? --      Excl. in GC? --      Constitutional: Alert and oriented. Well appearing and in no acute distress. Eyes: Conjunctivae are normal. PERRL. EOMI. Head: Atraumatic. ENT:  Ears:      Nose: No congestion/rhinnorhea.      Mouth/Throat: Mucous membranes are moist.  Neck: No stridor. Cardiovascular: Normal rate, regular rhythm.  Good peripheral circulation. Respiratory: Normal respiratory effort without tachypnea or retractions. Lungs CTAB. Good air entry to the bases with no decreased or absent breath sounds. Musculoskeletal: Full range of motion to all extremities. No gross deformities appreciated. Neurologic:  Normal speech and language. No gross focal neurologic deficits are appreciated.  Skin:  Skin is warm, dry and intact. 0.5 cm x 0.5 cm area of mild swelling and mild bruising near well healing laceration. No tenderness to palpation.   ____________________________________________   LABS (all labs ordered are listed, but only abnormal results are displayed)  Labs Reviewed - No data to display ____________________________________________  EKG   ____________________________________________  RADIOLOGY  No results found.  ____________________________________________    PROCEDURES  Procedure(s) performed:    Procedures    Medications - No data to display   ____________________________________________   INITIAL IMPRESSION / ASSESSMENT AND PLAN  / ED COURSE  Pertinent labs & imaging results that were available during my care of the patient were reviewed by me and considered in my medical decision making (see chart for details).  Review of the [place] CSRS was performed in accordance of the [facility] prior to dispensing any controlled drugs.     Patient's diagnosis is consistent with forhead contusion. Vital signs and exam are reassuring. Based on the pictures patient provided, area of bruising and swelling has significantly improved. Laceration is well-healing. Patient feels like it is getting better. Patient was instructed to not return to the gym until cleared by her primary care provider. Patient is to follow up with PCP as directed. Patient is given ED precautions to return to the ED for any worsening or new symptoms.     ____________________________________________  FINAL CLINICAL IMPRESSION(S) / ED DIAGNOSES  Final diagnoses:  Contusion of scalp, subsequent encounter      NEW MEDICATIONS STARTED DURING THIS VISIT:  Discharge Medication List as of 01/15/2017  3:24 PM          This chart was dictated using voice recognition software/Dragon. Despite best efforts to proofread, errors can occur which can change the meaning. Any change was purely unintentional.    Enid DerryAshley Nathan Moctezuma, PA-C 01/15/17 1600    Rockne MenghiniAnne-Caroline Norman, MD 01/16/17 1538

## 2017-01-15 NOTE — ED Triage Notes (Signed)
States she was seen last week for suture removal after a fall and now states "knots" on her left forehead area, briusing noted to forehead, denies any new injruy

## 2017-01-19 ENCOUNTER — Ambulatory Visit: Payer: Medicare Other

## 2017-01-23 ENCOUNTER — Ambulatory Visit
Admission: RE | Admit: 2017-01-23 | Discharge: 2017-01-23 | Disposition: A | Discharge status: Home / Self Care | Payer: Medicare Other | Source: Ambulatory Visit | Attending: Oncology | Admitting: Oncology

## 2017-01-23 DIAGNOSIS — Z87891 Personal history of nicotine dependence: Secondary | ICD-10-CM | POA: Diagnosis present

## 2017-01-23 DIAGNOSIS — J439 Emphysema, unspecified: Secondary | ICD-10-CM | POA: Diagnosis not present

## 2017-01-23 DIAGNOSIS — R918 Other nonspecific abnormal finding of lung field: Secondary | ICD-10-CM | POA: Insufficient documentation

## 2017-01-23 DIAGNOSIS — Z122 Encounter for screening for malignant neoplasm of respiratory organs: Secondary | ICD-10-CM | POA: Diagnosis present

## 2017-01-29 ENCOUNTER — Encounter: Payer: Self-pay | Admitting: *Deleted

## 2017-02-06 ENCOUNTER — Ambulatory Visit (INDEPENDENT_AMBULATORY_CARE_PROVIDER_SITE_OTHER): Payer: Medicare Other | Admitting: Pulmonary Disease

## 2017-02-06 ENCOUNTER — Encounter: Payer: Self-pay | Admitting: Pulmonary Disease

## 2017-02-06 ENCOUNTER — Ambulatory Visit (INDEPENDENT_AMBULATORY_CARE_PROVIDER_SITE_OTHER)
Admission: RE | Admit: 2017-02-06 | Discharge: 2017-02-06 | Disposition: A | Discharge status: Home / Self Care | Payer: Medicare Other | Source: Ambulatory Visit | Attending: Pulmonary Disease | Admitting: Pulmonary Disease

## 2017-02-06 VITALS — BP 126/72 | HR 92 | Temp 98.4°F | Ht 66.0 in | Wt 117.0 lb

## 2017-02-06 DIAGNOSIS — R05 Cough: Secondary | ICD-10-CM

## 2017-02-06 DIAGNOSIS — R059 Cough, unspecified: Secondary | ICD-10-CM

## 2017-02-06 DIAGNOSIS — J441 Chronic obstructive pulmonary disease with (acute) exacerbation: Secondary | ICD-10-CM | POA: Diagnosis not present

## 2017-02-06 DIAGNOSIS — Z87891 Personal history of nicotine dependence: Secondary | ICD-10-CM | POA: Diagnosis not present

## 2017-02-06 MED ORDER — DOXYCYCLINE HYCLATE 100 MG PO TABS: 100.0000 mg | ORAL_TABLET | Freq: Two times a day (BID) | ORAL | 0 refills | Status: DC

## 2017-02-06 MED ORDER — PREDNISONE 10 MG PO TABS
ORAL_TABLET | ORAL | 0 refills | Status: DC
Start: 2017-02-06 — End: 2017-02-20

## 2017-02-06 NOTE — Assessment & Plan Note (Signed)
Unfortunately she is having another flareup of her COPD as she's moving very poor air today and having increasing cough. Because of recurrent episodes of pneumonia from going to get a chest x-ray. However, I just suspect this is a COPD exacerbation.  Given her recurrent exacerbations when need to try her on a long-acting muscarinic agents again. She's done poorly with dry powder inhalers in the past. She had a reaction to Spiriva in the past. Given her reactions in the past I want to avoid starting anything new right now while she is sick.  Plan: Chest x-ray Prednisone taper Doxycycline 5 days Use albuterol nebulized regular daily for the next 3 days Follow-up in 2 weeks with our nurse practitioner which point I would like for her to start on a long-acting muscarinic, consider prior reaction she's had 2 dry powder forms of Spiriva

## 2017-02-06 NOTE — Patient Instructions (Signed)
Take the prednisone taper as prescribed Take the doxycycline as prescribed Take albuterol nebulized 2-3 times a day for the next 3 days Follow-up with [place] in 2 weeks with our nurse practitioner, at that point I want to start you on a new inhaler We will call you with the results of the chest x-ray today

## 2017-02-06 NOTE — Progress Notes (Signed)
Subjective:    Patient ID: Heidi [name], female    DOB: 23-Dec-1955, 61 y.o.   MRN: [id number]  Synopsis: Heidi [name] first saw Dr. Sherene Sires with the [facility] clinic in 2013. She has COPD and 8 2013 FEV1 was 0.72 L (27% predicted). She smoked heavily up until October of 2013.  She had bad mouth sores from Spiriva.  HPI  Chief Complaint  Patient presents with  . Follow-up    pt c/o worsening nonprod cough, back pain X3 days.  denies sinus congestion, CP, mucus production.      Heidi [name] was doing OK until the last three day she started having a dry cough.  She says this is similar to her episode of pneumonia in February 2018 when she was hospitalized.  She is still taking her inhaled medicines (symbicort).  She hsa increasing dyspnea over the last three days as well.  No fever or chills.   She is not smoking cigarettes.    Prior to that she's had several ER visits secondary to a fall. She is noted to be intoxicated and one of these visits.  She says that Symbicort remains helpful.   Past Medical History:  Diagnosis Date  . COPD (chronic obstructive pulmonary disease) (HCC)   . GERD (gastroesophageal reflux disease)   . Hypotension    limiting med titration  . NSTEMI (non-ST elevated myocardial infarction) (HCC)    12/2011 with normal coronaries possibly secondary to Takotsubo (EF 30-35% by echo), occurred following two episodes of acute respiratory distress  . 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  HENT: Negative for congestion, postnasal drip, rhinorrhea and sinus pressure.   Respiratory: Positive for shortness of breath. Negative for cough and wheezing.   Cardiovascular: Negative for chest pain, palpitations and leg swelling.       Objective:   Physical Exam Vitals:   02/06/17 1044  BP: 126/72  Pulse: 92  Temp: 98.4 F (36.9 C)  TempSrc: Oral  SpO2: 96%  Weight: 117 lb (53.1 kg)   Height:  (1.676 m)  RA   Gen: mildly ill appearing HENT: OP clear, TM's clear, neck supple PULM: Poor air movement B, normal percussion CV: RRR, no mgr, trace edema GI: BS+, soft, nontender Derm: no cyanosis or rash Psyche: normal mood and affect   Multiple ER records from March 2018 reviewed were she presented with a fall, had a head laceration and sutures. She was noted to be intoxicated on the March 2018 visit  Hospital records from her hospitalization in FEbruary 2018 reviewed where she had a COPD exacerbation and CAP  CBC    Component Value Date/Time   WBC 11.4 (H) 12/03/2016 0111   RBC 4.00 12/03/2016 0111   HGB 12.5 12/03/2016 0111   HGB 12.4 11/29/2014 0439   HCT 37.4 12/03/2016 0111   HCT 37.6 11/29/2014 0439   PLT 229 12/03/2016 0111   PLT 293 11/29/2014 0439   MCV 93.4 12/03/2016 0111   MCV 99 11/29/2014 0439   MCH 31.2 12/03/2016 0111   MCHC 33.4 12/03/2016 0111   RDW 15.3 (H) 12/03/2016 0111   RDW 13.1 11/29/2014 0439   LYMPHSABS 0.2 (L) 12/03/2016 0111   LYMPHSABS 0.8 (L) 11/29/2014 0439   MONOABS 0.2 12/03/2016 0111   MONOABS 0.1 (L) 11/29/2014 0439   EOSABS 0.0 12/03/2016 0111   EOSABS 0.0 11/29/2014 0439   BASOSABS 0.0 12/03/2016 0111  BASOSABS 0.0 11/29/2014 0439       Assessment & Plan:   Ex-smoker CT scan from March 2018 showed no evidence of malignancy. RADS score 2. Follow-up 12 months.  COPD exacerbation (HCC) Unfortunately she is having another flareup of her COPD as she's moving very poor air today and having increasing cough. Because of recurrent episodes of pneumonia from going to get a chest x-ray. However, I just suspect this is a COPD exacerbation.  Given her recurrent exacerbations when need to try her on a long-acting muscarinic agents again. She's done poorly with dry powder inhalers in the past. She had a reaction to Spiriva in the past. Given her reactions in the past I want to avoid starting anything new right now while  she is sick.  Plan: Chest x-ray Prednisone taper Doxycycline 5 days Use albuterol nebulized regular daily for the next 3 days Follow-up in 2 weeks with our nurse practitioner which point I would like for her to start on a long-acting muscarinic, consider prior reaction she's had 2 dry powder forms of Spiriva   Updated Medication List Outpatient Encounter Prescriptions as of 02/06/2017  Medication Sig  . acetaminophen (TYLENOL) 325 MG tablet Take 650 mg by mouth every 6 (six) hours as needed for mild pain or moderate pain.  Marland Kitchen albuterol (PROVENTIL HFA;VENTOLIN HFA) 108 (90 Base) MCG/ACT inhaler Inhale 1-2 puffs into the lungs every 4 (four) hours as needed for wheezing or shortness of breath.  . ALPRAZolam (XANAX) 0.25 MG tablet Take 0.25 mg by mouth at bedtime as needed for anxiety.  . COMBIVENT RESPIMAT 20-100 MCG/ACT AERS respimat INHALE ONE PUFF BY MOUTH EVERY 6 HOURS AS NEEDED FOR WHEEZING  . escitalopram (LEXAPRO) 5 MG tablet Take 20 mg by mouth daily.   . fluticasone (FLONASE) 50 MCG/ACT nasal spray USE TWO SPRAY(S) IN EACH NOSTRIL ONCE DAILY  . guaiFENesin-dextromethorphan (ROBITUSSIN DM) 100-10 MG/5ML syrup Take 5 mLs by mouth every 6 (six) hours as needed for cough.  Marland Kitchen ipratropium-albuterol (DUONEB) 0.5-2.5 (3) MG/3ML SOLN Take 3 mLs by nebulization every 6 (six) hours as needed. DX code: J44.9  . oxycodone (OXY-IR) 5 MG capsule Take 5 mg by mouth every 4 (four) hours as needed.  . OXYGEN Inhale 2 L into the lungs as needed.  . pantoprazole (PROTONIX) 40 MG tablet Take 40 mg by mouth 2 (two) times daily. Take 30-60 min before first meal of the day  . propranolol (INDERAL) 40 MG tablet Take 40 mg by mouth 2 (two) times daily.  . SYMBICORT 160-4.5 MCG/ACT inhaler INHALE TWO PUFFS BY MOUTH TWICE DAILY  . [DISCONTINUED] predniSONE (STERAPRED UNI-PAK 21 TAB) 10 MG (21) TBPK tablet Take 6 tabs first day, 5 tab on day 2, then 4 on day 3rd, 3 tabs on day 4th , 2 tab on day 5th, and 1 tab on  6th day.  Marland Kitchen doxycycline (VIBRA-TABS) 100 MG tablet Take 1 tablet (100 mg total) by mouth 2 (two) times daily.  . predniSONE (DELTASONE) 10 MG tablet Take  po daily for 3 days, then take  po daily for 3 days, then take  po daily for two days, then take  po daily for 2 days   No facility-administered encounter medications on file as of 02/06/2017.

## 2017-02-06 NOTE — Assessment & Plan Note (Signed)
CT scan from March 2018 showed no evidence of malignancy. RADS score 2. Follow-up 12 months.

## 2017-02-07 ENCOUNTER — Telehealth: Payer: Self-pay | Admitting: Pulmonary Disease

## 2017-02-07 NOTE — Progress Notes (Signed)
Called and spoke to pt. Informed her of the results of CXR per BQ. Pt verbalized understanding and denied any further questions or concerns at this time.

## 2017-02-07 NOTE — Telephone Encounter (Signed)
Notes recorded by Lupita Leash, MD on 02/06/2017 at 3:54 PM EDT A, Please let the patient know this was OK Thanks, B  Called and spoke to pt. Informed her of the results of CXR per BQ. Pt verbalized understanding and denied any further questions or concerns at this time.

## 2017-02-09 ENCOUNTER — Other Ambulatory Visit: Payer: Self-pay | Admitting: Pulmonary Disease

## 2017-02-20 ENCOUNTER — Ambulatory Visit (INDEPENDENT_AMBULATORY_CARE_PROVIDER_SITE_OTHER): Payer: Medicare Other | Admitting: Adult Health

## 2017-02-20 ENCOUNTER — Encounter: Payer: Self-pay | Admitting: Adult Health

## 2017-02-20 DIAGNOSIS — J441 Chronic obstructive pulmonary disease with (acute) exacerbation: Secondary | ICD-10-CM | POA: Diagnosis not present

## 2017-02-20 MED ORDER — GLYCOPYRROLATE-FORMOTEROL 9-4.8 MCG/ACT IN AERO: 2.0000 | INHALATION_SPRAY | Freq: Two times a day (BID) | RESPIRATORY_TRACT | 0 refills | Status: DC

## 2017-02-20 MED ORDER — GLYCOPYRROLATE-FORMOTEROL 9-4.8 MCG/ACT IN AERO: 2.0000 | INHALATION_SPRAY | Freq: Two times a day (BID) | RESPIRATORY_TRACT | 5 refills | Status: DC

## 2017-02-20 NOTE — Progress Notes (Signed)
  ID: Heidi [name], female    DOB: Jul 07, 1956, 61 y.o.   MRN: [id number]  Chief Complaint  Patient presents with  . Follow-up    COPD     Referring provider: Leotis Shames, MD  HPI: 61 yo female former smoker with very severe COPD   TEST  8 2013 FEV1 was 0.72 L (27% predicted  02/20/17  Follow up : COPD  Pt returns for 2 week follow up for COPD . She was having increased COPD sx last ov with increased dyspnea and cough . She was treated with Doxyccyline and prednisone taper . She is feeling much better. Decreased cough and dyspnea. CXR showed chronic scarring .  She is currently on Symbicort with As needed  combivent /duoneb .  We discussed additonal controller inhaler.  Does not want dry powder , it irritates her mouth .  She denies fever , hemoptysis , chest pain .    Allergies  Allergen Reactions  . Codeine Nausea Only  . Levaquin [Levofloxacin In D5w] Hives  . Propranolol Other (See Comments)    dizziness  . Zoloft [Sertraline Hcl]     Increased anxiety    Immunization History  Administered Date(s) Administered  . Influenza Split 07/14/2013, 07/07/2014, 08/02/2015, 09/08/2016  . Influenza Whole 09/07/2006, 09/02/2008, 08/02/2009, 08/22/2010, 08/30/2012  . Pneumococcal Conjugate-13 08/13/2015  . Pneumococcal Polysaccharide-23 07/02/2006, 01/25/2012  . Td 07/31/2005  . Tdap 12/29/2016    Past Medical History:  Diagnosis Date  . COPD (chronic obstructive pulmonary disease) (HCC)   . GERD (gastroesophageal reflux disease)   . Hypotension    limiting med titration  . NSTEMI (non-ST elevated myocardial infarction) (HCC)    12/2011 with normal coronaries possibly secondary to Takotsubo (EF 30-35% by echo), occurred following two episodes of acute respiratory distress  . 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 Former Smoker  . Packs/day: 1.00  . Years: 30.00  . Types: Cigarettes  . Quit date: 01/21/2012  Smokeless  Tobacco  . Never Used   Counseling given: Not Answered   Outpatient Encounter Prescriptions as of 02/20/2017  Medication Sig  . acetaminophen (TYLENOL) 325 MG tablet Take 650 mg by mouth every 6 (six) hours as needed for mild pain or moderate pain.  Marland Kitchen albuterol (PROVENTIL HFA;VENTOLIN HFA) 108 (90 Base) MCG/ACT inhaler Inhale 1-2 puffs into the lungs every 4 (four) hours as needed for wheezing or shortness of breath.  . ALPRAZolam (XANAX) 0.25 MG tablet Take 0.25 mg by mouth at bedtime as needed for anxiety.  Marland Kitchen escitalopram (LEXAPRO) 20 MG tablet Take 20 mg by mouth daily.   . fluticasone (FLONASE) 50 MCG/ACT nasal spray USE TWO SPRAY(S) IN EACH NOSTRIL ONCE DAILY  . guaiFENesin-dextromethorphan (ROBITUSSIN DM) 100-10 MG/5ML syrup Take 5 mLs by mouth every 6 (six) hours as needed for cough.  . OXYGEN Inhale 2 L into the lungs as needed.  . pantoprazole (PROTONIX) 40 MG tablet Take 40 mg by mouth 2 (two) times daily as needed. Take 30-60 min before first meal of the day   . Glycopyrrolate-Formoterol (BEVESPI AEROSPHERE) 9-4.8 MCG/ACT AERO Inhale 2 puffs into the lungs 2 (two) times daily.  . Glycopyrrolate-Formoterol (BEVESPI AEROSPHERE) 9-4.8 MCG/ACT AERO Inhale 2 puffs into the lungs 2 (two) times daily.  . [DISCONTINUED] COMBIVENT RESPIMAT 20-100 MCG/ACT AERS respimat INHALE ONE PUFF BY MOUTH EVERY 6 HOURS AS NEEDED FOR WHEEZING (Patient not taking: Reported on 02/20/2017)  . [DISCONTINUED] doxycycline (VIBRA-TABS) 100  MG tablet Take 1 tablet (100 mg total) by mouth 2 (two) times daily. (Patient not taking: Reported on 02/20/2017)  . [DISCONTINUED] ipratropium-albuterol (DUONEB) 0.5-2.5 (3) MG/3ML SOLN Take 3 mLs by nebulization every 6 (six) hours as needed. DX code: J44.9 (Patient not taking: Reported on 02/20/2017)  . [DISCONTINUED] oxycodone (OXY-IR) 5 MG capsule Take 5 mg by mouth every 4 (four) hours as needed.  . [DISCONTINUED] predniSONE (DELTASONE) 10 MG tablet Take  po daily for 3  days, then take  po daily for 3 days, then take  po daily for two days, then take  po daily for 2 days (Patient not taking: Reported on 02/20/2017)  . [DISCONTINUED] propranolol (INDERAL) 40 MG tablet Take 40 mg by mouth 2 (two) times daily.  . [DISCONTINUED] SYMBICORT 160-4.5 MCG/ACT inhaler INHALE TWO PUFFS BY MOUTH TWICE DAILY (Patient not taking: Reported on 02/20/2017)   No facility-administered encounter medications on file as of 02/20/2017.      Review of Systems  Constitutional:   No  weight loss, night sweats,  Fevers, chills, fatigue, or  lassitude.  HEENT:   No headaches,  Difficulty swallowing,  Tooth/dental problems, or  Sore throat,                No sneezing, itching, ear ache, nasal congestion, post nasal drip,   CV:  No chest pain,  Orthopnea, PND, swelling in lower extremities, anasarca, dizziness, palpitations, syncope.   GI  No heartburn, indigestion, abdominal pain, nausea, vomiting, diarrhea, change in bowel habits, loss of appetite, bloody stools.   Resp:   No chest wall deformity  Skin: no rash or lesions.  GU: no dysuria, change in color of urine, no urgency or frequency.  No flank pain, no hematuria   MS:  No joint pain or swelling.  No decreased range of motion.  No back pain.    Physical Exam  BP 122/68 (BP Location: Left Arm, Cuff Size: Normal)   Pulse 84   Ht  (1.676 m)   Wt 119 lb 12.8 oz (54.3 kg)   SpO2 95%   BMI 19.34 kg/m   GEN: A/Ox3; pleasant , NAD,elderly    HEENT:  [place]/AT,  EACs-clear, TMs-wnl, NOSE-clear, THROAT-clear, no lesions, no postnasal drip or exudate noted.   NECK:  Supple w/ fair ROM; no JVD; normal carotid impulses w/o bruits; no thyromegaly or nodules palpated; no lymphadenopathy.    RESP  Decreased BS in bases ,  no accessory muscle use, no dullness to percussion  CARD:  RRR, no m/r/g, no peripheral edema, pulses intact, no cyanosis or clubbing.  GI:   Soft & nt; nml bowel sounds; no organomegaly or  masses detected.   Musco: Warm bil, no deformities or joint swelling noted.   Neuro: alert, no focal deficits noted.    Skin: Warm, no lesions or rashes    Lab Results:    Assessment & Plan:   No problem-specific Assessment & Plan notes found for this encounter.     Rubye Oaks, NP  02/20/17

## 2017-02-20 NOTE — Patient Instructions (Signed)
Stop Symbicort, Comvivent and Duoneb.  Begin BEVESPI 2 puffs Twice daily  , rinse after use.  May use Ventolin or Albuterol neb as rescue every 4hr as needed.  Follow up with Dr. Kendrick Fries in 6-8 weeks and As needed   Please contact office for sooner follow up if symptoms do not improve or worsen or seek emergency care

## 2017-02-21 ENCOUNTER — Emergency Department: Payer: Medicare Other

## 2017-02-21 ENCOUNTER — Encounter: Payer: Self-pay | Admitting: *Deleted

## 2017-02-21 ENCOUNTER — Emergency Department
Admission: EM | Admit: 2017-02-21 | Discharge: 2017-02-21 | Disposition: A | Discharge status: Home / Self Care | Payer: Medicare Other | Attending: Emergency Medicine | Admitting: Emergency Medicine

## 2017-02-21 DIAGNOSIS — Z87891 Personal history of nicotine dependence: Secondary | ICD-10-CM | POA: Insufficient documentation

## 2017-02-21 DIAGNOSIS — Z79899 Other long term (current) drug therapy: Secondary | ICD-10-CM | POA: Insufficient documentation

## 2017-02-21 DIAGNOSIS — R0789 Other chest pain: Secondary | ICD-10-CM | POA: Diagnosis present

## 2017-02-21 DIAGNOSIS — J441 Chronic obstructive pulmonary disease with (acute) exacerbation: Secondary | ICD-10-CM | POA: Diagnosis not present

## 2017-02-21 DIAGNOSIS — K219 Gastro-esophageal reflux disease without esophagitis: Secondary | ICD-10-CM | POA: Diagnosis not present

## 2017-02-21 LAB — BASIC METABOLIC PANEL
Anion gap: 6 (ref 5–15)
BUN: 10 mg/dL (ref 6–20)
CALCIUM: 8.7 mg/dL — AB (ref 8.9–10.3)
CO2: 29 mmol/L (ref 22–32)
Chloride: 101 mmol/L (ref 101–111)
Creatinine, Ser: 0.83 mg/dL (ref 0.44–1.00)
GFR calc Af Amer: >60 mL/min (ref >60)
GLUCOSE: 139 mg/dL — AB (ref 65–99)
Potassium: 4.1 mmol/L (ref 3.5–5.1)
Sodium: 136 mmol/L (ref 135–145)

## 2017-02-21 LAB — CBC
HCT: 39 % (ref 35.0–47.0)
HEMOGLOBIN: 13 g/dL (ref 12.0–16.0)
MCH: 32.5 pg (ref 26.0–34.0)
MCHC: 33.5 g/dL (ref 32.0–36.0)
MCV: 97 fL (ref 80.0–100.0)
Platelets: 280 10*3/uL (ref 150–440)
RBC: 4.02 MIL/uL (ref 3.80–5.20)
RDW: 13.8 % (ref 11.5–14.5)
WBC: 9.6 10*3/uL (ref 3.6–11.0)

## 2017-02-21 LAB — TROPONIN I: Troponin I: <0.03 ng/mL (ref <0.03)

## 2017-02-21 MED ORDER — METOCLOPRAMIDE HCL 10 MG PO TABS: 10.0000 mg | ORAL_TABLET | Freq: Four times a day (QID) | ORAL | 0 refills | Status: DC | PRN

## 2017-02-21 MED ORDER — GI COCKTAIL ~~LOC~~
ORAL | Status: AC
  Administered 2017-02-21: 30 mL via ORAL
  Filled 2017-02-21: qty 30

## 2017-02-21 MED ORDER — FAMOTIDINE 20 MG PO TABS
ORAL_TABLET | ORAL | Status: AC
  Administered 2017-02-21: 40 mg via ORAL
  Filled 2017-02-21: qty 2

## 2017-02-21 MED ORDER — FAMOTIDINE 20 MG PO TABS: 20.0000 mg | ORAL_TABLET | Freq: Two times a day (BID) | ORAL | 0 refills | Status: DC

## 2017-02-21 MED ORDER — ALUMINUM-MAGNESIUM-SIMETHICONE 200-200-20 MG/5ML PO SUSP: 30.0000 mL | Freq: Three times a day (TID) | ORAL | 0 refills | Status: DC

## 2017-02-21 MED ORDER — FAMOTIDINE 20 MG PO TABS
40.0000 mg | ORAL_TABLET | Freq: Once | ORAL | Status: AC
  Administered 2017-02-21: 40 mg via ORAL

## 2017-02-21 MED ORDER — GI COCKTAIL ~~LOC~~
30.0000 mL | ORAL | Status: AC
Start: 2017-02-21 — End: 2017-02-21
  Administered 2017-02-21: 30 mL via ORAL

## 2017-02-21 NOTE — ED Triage Notes (Addendum)
Pt states mid center chest burning that began this AM at 0300, took antacid with no relief, states SOB, states this is the 3rd episode like this in 3 weeks,  at present pt awake and alert

## 2017-02-21 NOTE — ED Provider Notes (Signed)
[facility] Emergency Department Provider Note  ____________________________________________  Time seen: Approximately 8:38 AM  I have reviewed the triage vital signs and the nursing notes.   HISTORY  Chief Complaint Chest Pain    HPI NAZIYA HEGWOOD is a 61 y.o. female who complains of chest pain starting last night at about 3 AM. Woke her up in the middle the night. This is the third episode she's had in the last several weeks, each episode happens at night while she is resting asleep. No pain during the day. Not exertional or pleuritic symptoms. Nonradiating, no shortness of breath vomiting or diaphoresis. Pain is sharp and burning. Feels like GERD. Sometimes hurts in her stomach to  She is on Protonix for GERD, but does not take it every day. She tried taking it for the first time in a while this morning when her symptoms started but found that it did not work. She drank some milk without relief as well. He tried laying on her left side     Past Medical History:  Diagnosis Date  . COPD (chronic obstructive pulmonary disease) (HCC)   . GERD (gastroesophageal reflux disease)   . Hypotension    limiting med titration  . NSTEMI (non-ST elevated myocardial infarction) (HCC)    12/2011 with normal coronaries possibly secondary to Takotsubo (EF 30-35% by echo), occurred following two episodes of acute respiratory distress  . 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 Sepsis due to pneumonia (HCC) 12/02/2016  . COPD (chronic obstructive pulmonary disease) (HCC) 01/13/2016  . Near syncope 12/01/2015  . Aortic atherosclerosis (HCC) 12/01/2015  . CAP (community acquired pneumonia) 01/05/2015  . Ex-smoker 07/14/2014  . Allergic rhinitis 03/03/2014  . Anxiety 04/10/2013  . Hoarseness 10/10/2012  . Acute respiratory failure (HCC) 08/08/2012  . Acute encephalopathy 08/08/2012  . COPD with acute exacerbation (HCC)  08/08/2012  . Lactic acidosis 08/08/2012  . Chronic respiratory failure (HCC) 03/27/2012  . Takotsubo cardiomyopathy 01/31/2012  . Chest pain 01/23/2012  . TINGLING 05/26/2010  . SKIN LESION 11/08/2009  . ASCUS PAP 08/02/2009  . VAG HIGH RISK HUMAN PAPILLOMAVIRUS DNA TEST POS 08/02/2009  . DEPRESSION, CHRONIC with anxiety 06/22/2009  . MICROSCOPIC HEMATURIA 06/22/2009  . Smoking history 04/06/2009  . BRUISE 04/06/2009  . URI 10/09/2007  . HYSTERECTOMY, PARTIAL, HX OF 07/01/2007  . COPD exacerbation (HCC) 06/18/2007  . OSTEOPENIA 06/18/2007  . 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 ABDOMINAL HYSTERECTOMY    . CARDIAC CATHETERIZATION    . LEFT HEART CATHETERIZATION WITH CORONARY ANGIOGRAM N/A 01/23/2012   Procedure: LEFT HEART CATHETERIZATION WITH CORONARY ANGIOGRAM;  Surgeon: Dolores Patty, MD;  Location: [facility] CATH LAB;  Service: Cardiovascular;  Laterality: N/A;  . vagina rectal repair     after childbirth     Prior to Admission medications   Medication Sig Start Date End Date Taking? Authorizing Provider  acetaminophen (TYLENOL) 325 MG tablet Take 650 mg by mouth every 6 (six) hours as needed for mild pain or moderate pain.   Yes Historical Provider, MD  albuterol (PROVENTIL HFA;VENTOLIN HFA) 108 (90 Base) MCG/ACT inhaler Inhale 1-2 puffs into the lungs every 4 (four) hours as needed for wheezing or shortness of breath.   Yes Historical Provider, MD  ALPRAZolam Prudy Feeler) 0.25 MG tablet Take 0.25 mg by mouth at bedtime as needed for anxiety.   Yes Historical  Provider, MD  escitalopram (LEXAPRO) 20 MG tablet Take 20 mg by mouth daily.    Yes Historical Provider, MD  fluticasone (FLONASE) 50 MCG/ACT nasal spray USE TWO SPRAY(S) IN EACH NOSTRIL ONCE DAILY 02/09/17  Yes Lupita Leash, MD  Glycopyrrolate-Formoterol (BEVESPI AEROSPHERE) 9-4.8 MCG/ACT AERO Inhale 2 puffs into the lungs 2 (two) times daily. 02/20/17  Yes Tammy S  Parrett, NP  guaiFENesin-dextromethorphan (ROBITUSSIN DM) 100-10 MG/5ML syrup Take 5 mLs by mouth every 6 (six) hours as needed for cough. 12/04/16  Yes Altamese Dilling, MD  pantoprazole (PROTONIX) 40 MG tablet Take 40 mg by mouth 2 (two) times daily as needed. Take 30-60 min before first meal of the day  01/06/13  Yes Nyoka Cowden, MD  aluminum-magnesium hydroxide-simethicone (MAALOX) 200-200-20 MG/5ML SUSP Take 30 mLs by mouth 4 (four) times daily -  before meals and at bedtime. 02/21/17   Sharman Cheek, MD  famotidine (PEPCID) 20 MG tablet Take 1 tablet (20 mg total) by mouth 2 (two) times daily. 02/21/17   Sharman Cheek, MD  Glycopyrrolate-Formoterol (BEVESPI AEROSPHERE) 9-4.8 MCG/ACT AERO Inhale 2 puffs into the lungs 2 (two) times daily. 02/20/17   Tammy S Parrett, NP  metoCLOPramide (REGLAN) 10 MG tablet Take 1 tablet (10 mg total) by mouth every 6 (six) hours as needed. 02/21/17   Sharman Cheek, MD  OXYGEN Inhale 2 L into the lungs as needed.    Historical Provider, MD     Allergies Codeine; Levaquin [levofloxacin in d5w]; Propranolol; and Zoloft [sertraline hcl]   Family History  Problem Relation Age of Onset  . Heart attack Mother     deceased from massive MI at age 49  . Liver disease Father     fatty liver; living, age 31  . Stroke Brother     at age 62, living   . Hypertension Brother     living, age 35    Social History Social History  Substance Use Topics  . Smoking status: Former Smoker    Packs/day: 1.00    Years: 30.00    Types: Cigarettes    Quit date: 01/21/2012  . Smokeless tobacco: Never Used  . Alcohol use Yes     Comment: 1-2 beers per week    Review of Systems  Constitutional:   No fever or chills.  ENT:   No sore throat. No rhinorrhea. Lymphatic: No swollen glands, No extremity swelling Endocrine: No hot/cold flashes. No significant weight change. No neck swelling. Cardiovascular:   Positive chest pain as above. Respiratory:   No  dyspnea or cough. Gastrointestinal:   Negative for abdominal pain, vomiting and diarrhea.  Genitourinary:   Negative for dysuria or difficulty urinating. Musculoskeletal:   Negative for focal pain or swelling Neurological:   Negative for headaches or weakness. All other systems reviewed and are negative except as documented above in ROS and HPI.  ____________________________________________   PHYSICAL EXAM:  VITAL SIGNS: ED Triage Vitals  Enc Vitals Group     BP 02/21/17 0709 136/73     Pulse Rate 02/21/17 0709 90     Resp 02/21/17 0709 18     Temp 02/21/17 0709 98.6 F (37 C)     Temp Source 02/21/17 0709 Oral     SpO2 02/21/17 0709 100 %     Weight 02/21/17 0707 120 lb (54.4 kg)     Height 02/21/17 0707  (1.676 m)     Head Circumference --      Peak Flow --  Pain Score 02/21/17 0707 7     Pain Loc --      Pain Edu? --      Excl. in GC? --     Vital signs reviewed, nursing assessments reviewed.   Constitutional:   Alert and oriented. Well appearing and in no distress. Eyes:   No scleral icterus. No conjunctival pallor. PERRL. EOMI.  No nystagmus. ENT   Head:   Normocephalic and atraumatic.   Nose:   No congestion/rhinnorhea. No septal hematoma   Mouth/Throat:   MMM, no pharyngeal erythema. No peritonsillar mass.    Neck:   No stridor. No SubQ emphysema. No meningismus. Hematological/Lymphatic/Immunilogical:   No cervical lymphadenopathy. Cardiovascular:   RRR. Symmetric bilateral radial and DP pulses.  No murmurs.  Respiratory:   Normal respiratory effort without tachypnea nor retractions. Breath sounds are clear and equal bilaterally. No wheezes/rales/rhonchi. Chest wall nontender. Gastrointestinal:   Soft and nontender. Non distended. There is no CVA tenderness.  No rebound, rigidity, or guarding. Genitourinary:   deferred Musculoskeletal:   Normal range of motion in all extremities. No joint effusions.  No lower extremity tenderness.  No  edema. Neurologic:   Normal speech and language.  CN 2-10 normal. Motor grossly intact. No gross focal neurologic deficits are appreciated.  Skin:    Skin is warm, dry and intact. No rash noted.  No petechiae, purpura, or bullae.  ____________________________________________    LABS (pertinent positives/negatives) (all labs ordered are listed, but only abnormal results are displayed) Labs Reviewed  BASIC METABOLIC PANEL - Abnormal; Notable for the following:       Result Value   Glucose, Bld 139 (*)    Calcium 8.7 (*)    All other components within normal limits  CBC  TROPONIN I   ____________________________________________   EKG  Interpreted by me  Date: 02/21/2017  Rate: 90  Rhythm: normal sinus rhythm  QRS Axis: normal  Intervals: normal  ST/T Wave abnormalities: normal  Conduction Disutrbances: none  Narrative Interpretation: unremarkable      ____________________________________________    RADIOLOGY  Dg Chest 2 View  Result Date: 02/21/2017 CLINICAL DATA:  Chest pain EXAM: CHEST  2 VIEW COMPARISON:  February 06, 2017 FINDINGS: There is scarring in both apical regions, more on the right than on the left. There is also scarring in the right base with apparent bullous disease in the right base. There is no edema or consolidation. The heart size and pulmonary vascularity are normal. No adenopathy. No bone lesions. IMPRESSION: Areas of scarring. Bullous disease right base. No edema or consolidation. Stable cardiac silhouette. Electronically Signed   By: Bretta Bang III M.D.   On: 02/21/2017 07:55    ____________________________________________   PROCEDURES Procedures  ____________________________________________   INITIAL IMPRESSION / ASSESSMENT AND PLAN / ED COURSE  Pertinent labs & imaging results that were available during my care of the patient were reviewed by me and considered in my medical decision making (see chart for  details).       Clinical Course as of Feb 21 837  Wed Feb 21, 2017  0759 Atypical chest pain, likely gerd. Considering the patient's symptoms, medical history, and physical examination today, I have low suspicion for ACS, PE, TAD, pneumothorax, carditis, mediastinitis, pneumonia, CHF, or sepsis.    [PS]    Clinical Course User Index [PS] Sharman Cheek, MD     ----------------------------------------- 8:43 AM on 02/21/2017 -----------------------------------------  Workup negative. Discharge, Tums Pepcid and Reglan. Advised to take Protonix every  day. Return precautions, follow up with primary care.  ____________________________________________   FINAL CLINICAL IMPRESSION(S) / ED DIAGNOSES  Final diagnoses:  Gastroesophageal reflux disease, esophagitis presence not specified  Atypical chest pain      New Prescriptions   ALUMINUM-MAGNESIUM HYDROXIDE-SIMETHICONE (MAALOX) 200-200-20 MG/5ML SUSP    Take 30 mLs by mouth 4 (four) times daily -  before meals and at bedtime.   FAMOTIDINE (PEPCID) 20 MG TABLET    Take 1 tablet (20 mg total) by mouth 2 (two) times daily.   METOCLOPRAMIDE (REGLAN) 10 MG TABLET    Take 1 tablet (10 mg total) by mouth every 6 (six) hours as needed.     Portions of this note were generated with dragon dictation software. Dictation errors may occur despite best attempts at proofreading.    Sharman Cheek, MD 02/21/17 [phone]

## 2017-02-21 NOTE — Discharge Instructions (Signed)
Take protonix every day, even when you do not have GERD symptoms.

## 2017-03-01 ENCOUNTER — Emergency Department
Admission: EM | Admit: 2017-03-01 | Discharge: 2017-03-01 | Disposition: A | Discharge status: Home / Self Care | Payer: Medicare Other | Attending: Emergency Medicine | Admitting: Emergency Medicine

## 2017-03-01 ENCOUNTER — Emergency Department: Payer: Medicare Other

## 2017-03-01 ENCOUNTER — Encounter: Payer: Self-pay | Admitting: *Deleted

## 2017-03-01 DIAGNOSIS — Z79899 Other long term (current) drug therapy: Secondary | ICD-10-CM | POA: Insufficient documentation

## 2017-03-01 DIAGNOSIS — Z87891 Personal history of nicotine dependence: Secondary | ICD-10-CM | POA: Insufficient documentation

## 2017-03-01 DIAGNOSIS — R0602 Shortness of breath: Secondary | ICD-10-CM | POA: Diagnosis present

## 2017-03-01 DIAGNOSIS — J441 Chronic obstructive pulmonary disease with (acute) exacerbation: Secondary | ICD-10-CM | POA: Insufficient documentation

## 2017-03-01 LAB — CBC WITH DIFFERENTIAL/PLATELET
BASOS PCT: 0 %
Basophils Absolute: 0 10*3/uL (ref 0–0.1)
Eosinophils Absolute: 0.1 10*3/uL (ref 0–0.7)
Eosinophils Relative: 2 %
HEMATOCRIT: 40.1 % (ref 35.0–47.0)
Hemoglobin: 13.5 g/dL (ref 12.0–16.0)
LYMPHS ABS: 0.8 10*3/uL — AB (ref 1.0–3.6)
Lymphocytes Relative: 11 %
MCH: 31.7 pg (ref 26.0–34.0)
MCHC: 33.5 g/dL (ref 32.0–36.0)
MCV: 94.5 fL (ref 80.0–100.0)
MONO ABS: 0.5 10*3/uL (ref 0.2–0.9)
MONOS PCT: 7 %
NEUTROS ABS: 5.8 10*3/uL (ref 1.4–6.5)
Neutrophils Relative %: 80 %
Platelets: 296 10*3/uL (ref 150–440)
RBC: 4.25 MIL/uL (ref 3.80–5.20)
RDW: 13.4 % (ref 11.5–14.5)
WBC: 7.3 10*3/uL (ref 3.6–11.0)

## 2017-03-01 LAB — TROPONIN I: Troponin I: <0.03 ng/mL (ref <0.03)

## 2017-03-01 LAB — BASIC METABOLIC PANEL
Anion gap: 9 (ref 5–15)
BUN: 6 mg/dL (ref 6–20)
CALCIUM: 9.4 mg/dL (ref 8.9–10.3)
CHLORIDE: 99 mmol/L — AB (ref 101–111)
CO2: 30 mmol/L (ref 22–32)
CREATININE: 0.75 mg/dL (ref 0.44–1.00)
GFR calc Af Amer: >60 mL/min (ref >60)
GFR calc non Af Amer: >60 mL/min (ref >60)
GLUCOSE: 92 mg/dL (ref 65–99)
Potassium: 3.7 mmol/L (ref 3.5–5.1)
Sodium: 138 mmol/L (ref 135–145)

## 2017-03-01 MED ORDER — METHYLPREDNISOLONE SODIUM SUCC 125 MG IJ SOLR
125.0000 mg | Freq: Once | INTRAMUSCULAR | Status: AC
  Administered 2017-03-01: 125 mg via INTRAVENOUS
  Filled 2017-03-01: qty 2

## 2017-03-01 MED ORDER — IPRATROPIUM-ALBUTEROL 0.5-2.5 (3) MG/3ML IN SOLN
3.0000 mL | Freq: Once | RESPIRATORY_TRACT | Status: AC
  Administered 2017-03-01: 3 mL via RESPIRATORY_TRACT
  Filled 2017-03-01: qty 3

## 2017-03-01 MED ORDER — PREDNISONE 20 MG PO TABS: 60.0000 mg | ORAL_TABLET | Freq: Every day | ORAL | 0 refills | Status: AC

## 2017-03-01 MED ORDER — HYDROCOD POLST-CPM POLST ER 10-8 MG/5ML PO SUER
5.0000 mL | Freq: Every evening | ORAL | 0 refills | Status: DC | PRN
Start: 2017-03-01 — End: 2017-08-22

## 2017-03-01 MED ORDER — PREDNISONE 20 MG PO TABS: 60.0000 mg | ORAL_TABLET | Freq: Every day | ORAL | 0 refills | Status: DC

## 2017-03-01 MED ORDER — HYDROCOD POLST-CPM POLST ER 10-8 MG/5ML PO SUER: 5.0000 mL | Freq: Every evening | ORAL | 0 refills | Status: DC | PRN

## 2017-03-01 MED ORDER — ALBUTEROL SULFATE (2.5 MG/3ML) 0.083% IN NEBU
5.0000 mg | INHALATION_SOLUTION | Freq: Once | RESPIRATORY_TRACT | Status: AC
  Administered 2017-03-01: 5 mg via RESPIRATORY_TRACT

## 2017-03-01 MED ORDER — ALBUTEROL SULFATE (2.5 MG/3ML) 0.083% IN NEBU: 2.5000 mg | INHALATION_SOLUTION | Freq: Four times a day (QID) | RESPIRATORY_TRACT | 12 refills | Status: DC | PRN

## 2017-03-01 MED ORDER — HYDROCOD POLST-CPM POLST ER 10-8 MG/5ML PO SUER
5.0000 mL | Freq: Once | ORAL | Status: AC
  Administered 2017-03-01: 5 mL via ORAL
  Filled 2017-03-01: qty 5

## 2017-03-01 MED ORDER — ALBUTEROL SULFATE (2.5 MG/3ML) 0.083% IN NEBU
INHALATION_SOLUTION | RESPIRATORY_TRACT | Status: AC
  Administered 2017-03-01: 5 mg via RESPIRATORY_TRACT
  Filled 2017-03-01: qty 6

## 2017-03-01 NOTE — Assessment & Plan Note (Addendum)
Recent flare now resolving  Would like to use dual action inhaler LABA/LAMA-stay away from DPI   Plan  Patient Instructions  Stop Symbicort, Comvivent and Duoneb.  Begin BEVESPI 2 puffs Twice daily  , rinse after use.  May use Ventolin or Albuterol neb as rescue every 4hr as needed.  Follow up with [name] in 6-8 weeks and As needed   Please contact office for sooner follow up if symptoms do not improve or worsen or seek emergency care

## 2017-03-01 NOTE — ED Provider Notes (Signed)
[facility] Emergency Department Provider Note  ____________________________________________  Time seen: Approximately 1:57 PM  I have reviewed the triage vital signs and the nursing notes.   HISTORY  Chief Complaint Shortness of Breath and Cough   HPI Heidi [name] is a 61 y.o. female with a history of COPD and Takotsubo cardiomyopathy who presents for evaluation of cough and sob. Patient reports that her cough started yesterday, is dry and severe, keeping her up at night. She has had wheezing and shortness of breath worse with exertion. Also endorses chest tightness that resolves after she takes DuoNeb. She had similar symptoms in the end of April and was given a course of steroids by her PCP which improved her symptoms. No fever or chills, no sore throat, no nausea or vomiting, no diarrhea, no abdominal pain.  Past Medical History:  Diagnosis Date  . COPD (chronic obstructive pulmonary disease) (HCC)   . GERD (gastroesophageal reflux disease)   . Hypotension    limiting med titration  . NSTEMI (non-ST elevated myocardial infarction) (HCC)    12/2011 with normal coronaries possibly secondary to Takotsubo (EF 30-35% by echo), occurred following two episodes of acute respiratory distress  . 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 Sepsis due to pneumonia (HCC) 12/02/2016  . COPD (chronic obstructive pulmonary disease) (HCC) 01/13/2016  . Near syncope 12/01/2015  . Aortic atherosclerosis (HCC) 12/01/2015  . CAP (community acquired pneumonia) 01/05/2015  . Ex-smoker 07/14/2014  . Allergic rhinitis 03/03/2014  . Anxiety 04/10/2013  . Hoarseness 10/10/2012  . Acute respiratory failure (HCC) 08/08/2012  . Acute encephalopathy 08/08/2012  . COPD with acute exacerbation (HCC) 08/08/2012  . Lactic acidosis 08/08/2012  . Chronic respiratory failure (HCC) 03/27/2012  . Takotsubo cardiomyopathy 01/31/2012  . Chest  pain 01/23/2012  . TINGLING 05/26/2010  . SKIN LESION 11/08/2009  . ASCUS PAP 08/02/2009  . VAG HIGH RISK HUMAN PAPILLOMAVIRUS DNA TEST POS 08/02/2009  . DEPRESSION, CHRONIC with anxiety 06/22/2009  . MICROSCOPIC HEMATURIA 06/22/2009  . Smoking history 04/06/2009  . BRUISE 04/06/2009  . URI 10/09/2007  . HYSTERECTOMY, PARTIAL, HX OF 07/01/2007  . COPD exacerbation (HCC) 06/18/2007  . OSTEOPENIA 06/18/2007  . 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 ABDOMINAL HYSTERECTOMY    . CARDIAC CATHETERIZATION    . LEFT HEART CATHETERIZATION WITH CORONARY ANGIOGRAM N/A 01/23/2012   Procedure: LEFT HEART CATHETERIZATION WITH CORONARY ANGIOGRAM;  Surgeon: Dolores Patty, MD;  Location: [facility] CATH LAB;  Service: Cardiovascular;  Laterality: N/A;  . vagina rectal repair     after childbirth    Prior to Admission medications   Medication Sig Start Date End Date Taking? Authorizing Provider  budesonide-formoterol (SYMBICORT) 160-4.5 MCG/ACT inhaler Inhale 2 puffs into the lungs 2 (two) times daily.   Yes Historical Provider, MD  escitalopram (LEXAPRO) 20 MG tablet Take 20 mg by mouth daily.    Yes Historical Provider, MD  famotidine (PEPCID) 20 MG tablet Take 1 tablet (20 mg total) by mouth 2 (two) times daily. 02/21/17  Yes Sharman Cheek, MD  fluticasone [facility]) 50 MCG/ACT nasal spray USE TWO SPRAY(S) IN EACH NOSTRIL ONCE DAILY 02/09/17  Yes Lupita Leash, MD  Glycopyrrolate-Formoterol (BEVESPI AEROSPHERE) 9-4.8 MCG/ACT AERO Inhale 2 puffs into the lungs 2 (two) times daily. 02/20/17  Yes Tammy S Parrett, NP  pantoprazole (PROTONIX) 40 MG tablet Take 40 mg by mouth  2 (two) times daily as needed. Take 30-60 min before first meal of the day  01/06/13  Yes Nyoka Cowden, MD  acetaminophen (TYLENOL) 325 MG tablet Take 650 mg by mouth every 6 (six) hours as needed for mild pain or moderate pain.    Historical Provider, MD  albuterol (PROVENTIL  HFA;VENTOLIN HFA) 108 (90 Base) MCG/ACT inhaler Inhale 1-2 puffs into the lungs every 4 (four) hours as needed for wheezing or shortness of breath.    Historical Provider, MD  albuterol (PROVENTIL) (2.5 MG/3ML) 0.083% nebulizer solution Take 3 mLs (2.5 mg total) by nebulization every 6 (six) hours as needed for wheezing or shortness of breath. 03/01/17   Nita Sickle, MD  ALPRAZolam Prudy Feeler) 0.25 MG tablet Take 0.25 mg by mouth 2 (two) times daily as needed for anxiety.     Historical Provider, MD  aluminum-magnesium hydroxide-simethicone (MAALOX) 200-200-20 MG/5ML SUSP Take 30 mLs by mouth 4 (four) times daily -  before meals and at bedtime. 02/21/17   Sharman Cheek, MD  chlorpheniramine-HYDROcodone [facility]) 10-8 MG/5ML SUER Take 5 mLs by mouth at bedtime as needed for cough. 03/01/17   Nita Sickle, MD  guaiFENesin-dextromethorphan (ROBITUSSIN DM) 100-10 MG/5ML syrup Take 5 mLs by mouth every 6 (six) hours as needed for cough. 12/04/16   Altamese Dilling, MD  metoCLOPramide (REGLAN) 10 MG tablet Take 1 tablet (10 mg total) by mouth every 6 (six) hours as needed. 02/21/17   Sharman Cheek, MD  OXYGEN Inhale 2 L into the lungs as needed.    Historical Provider, MD  predniSONE (DELTASONE) 20 MG tablet Take 3 tablets (60 mg total) by mouth daily. 03/01/17 03/05/17  Nita Sickle, MD    Allergies Codeine; Levaquin [levofloxacin in d5w]; Propranolol; and Zoloft [sertraline hcl]  Family History  Problem Relation Age of Onset  . Heart attack Mother     deceased from massive MI at age 35  . Liver disease Father     fatty liver; living, age 88  . Stroke Brother     at age 12, living   . Hypertension Brother     living, age 36    Social History Social History  Substance Use Topics  . Smoking status: Former Smoker    Packs/day: 1.00    Years: 30.00    Types: Cigarettes    Quit date: 01/21/2012  . Smokeless tobacco: Never Used  . Alcohol use Yes     Comment: 1-2  beers per week    Review of Systems  Constitutional: Negative for fever. Eyes: Negative for visual changes. ENT: Negative for sore throat. Neck: No neck pain  Cardiovascular: + chest tightness. Respiratory: + shortness of breath, wheezing, and cough Gastrointestinal: Negative for abdominal pain, vomiting or diarrhea. Genitourinary: Negative for dysuria. Musculoskeletal: Negative for back pain. Skin: Negative for rash. Neurological: Negative for headaches, weakness or numbness. Psych: No SI or HI  ____________________________________________   PHYSICAL EXAM:  VITAL SIGNS: ED Triage Vitals  Enc Vitals Group     BP 03/01/17 1143 (!) 152/86     Pulse Rate 03/01/17 1143 95     Resp 03/01/17 1143 (!) 24     Temp 03/01/17 1143 98.2 F (36.8 C)     Temp Source 03/01/17 1143 Oral     SpO2 03/01/17 1142 97 %     Weight 03/01/17 1144 120 lb (54.4 kg)     Height 03/01/17 1144 5\' 6"  (1.676 m)     Head Circumference --  Peak Flow --      Pain Score 03/01/17 1142 0     Pain Loc --      Pain Edu? --      Excl. in GC? --     Constitutional: Alert and oriented. Well appearing and in no apparent distress. HEENT:      Head: Normocephalic and atraumatic.         Eyes: Conjunctivae are normal. Sclera is non-icteric. EOMI. PERRL      Mouth/Throat: Mucous membranes are moist.       Neck: Supple with no signs of meningismus. Cardiovascular: Regular rate and rhythm. No murmurs, gallops, or rubs. 2+ symmetrical distal pulses are present in all extremities. No JVD. Respiratory: Normal respiratory effort. Actively coughing, decreased air movement with diffuse expiratory wheezes  Gastrointestinal: Soft, non tender, and non distended with positive bowel sounds. No rebound or guarding. Genitourinary: No CVA tenderness. Musculoskeletal: Nontender with normal range of motion in all extremities. No edema, cyanosis, or erythema of extremities. Neurologic: Normal speech and language. Face is  symmetric. Moving all extremities. No gross focal neurologic deficits are appreciated. Skin: Skin is warm, dry and intact. No rash noted. Psychiatric: Mood and affect are normal. Speech and behavior are normal.  ____________________________________________   LABS (all labs ordered are listed, but only abnormal results are displayed)  Labs Reviewed  CBC WITH DIFFERENTIAL/PLATELET - Abnormal; Notable for the following:       Result Value   Lymphs Abs 0.8 (*)    All other components within normal limits  BASIC METABOLIC PANEL - Abnormal; Notable for the following:    Chloride 99 (*)    All other components within normal limits  TROPONIN I   ____________________________________________  EKG  ED ECG REPORT I, Nita Sickle, the attending physician, personally viewed and interpreted this ECG.  Normal sinus rhythm, rate of 97, normal intervals, normal axis, no ST elevations or depressions, flattening T waves in the anterior and lateral leads ____________________________________________  RADIOLOGY  CXR: Hyperinflation and bronchitic changes. No focal acute pulmonary abnormality.  ____________________________________________   PROCEDURES  Procedure(s) performed: None Procedures Critical Care performed:  None ____________________________________________   INITIAL IMPRESSION / ASSESSMENT AND PLAN / ED COURSE  61 y.o. female with a history of COPD and Takotsubo cardiomyopathy who presents for evaluation of cough and sob and wheezing. Patient is in mild respiratory distress with normal sats, diffuse expiratory wheezes and decreased air movement concerning for COPD exacerbation. She is afebrile and chest x-ray shows no evidence of pneumonia. We'll treat her with a DuoNeb 3, Solu-Medrol and reassess.     _________________________ 3:28 PM on 03/01/2017 ----------------------------------------- Patient feels markedly improved. Moving great air, no wheezing, labs and CXR with no  acute findings. Will dc home on albuterol, prednisone, and close f/u with PCP.   Pertinent labs & imaging results that were available during my care of the patient were reviewed by me and considered in my medical decision making (see chart for details).    ____________________________________________   FINAL CLINICAL IMPRESSION(S) / ED DIAGNOSES  Final diagnoses:  COPD exacerbation (HCC)      NEW MEDICATIONS STARTED DURING THIS VISIT:  Current Discharge Medication List    START taking these medications   Details  albuterol (PROVENTIL) (2.5 MG/3ML) 0.083% nebulizer solution Take 3 mLs (2.5 mg total) by nebulization every 6 (six) hours as needed for wheezing or shortness of breath. Qty: 75 mL, Refills: 12    chlorpheniramine-HYDROcodone (TUSSIONEX PENNKINETIC ER) 10-8 MG/5ML SUER  Take 5 mLs by mouth at bedtime as needed for cough. Qty: 30 mL, Refills: 0    predniSONE (DELTASONE) 20 MG tablet Take 3 tablets (60 mg total) by mouth daily. Qty: 12 tablet, Refills: 0         Note:  This document was prepared using Dragon voice recognition software and may include unintentional dictation errors.    [place] VeroNita Sicklenese, MD 03/01/17 [phone]

## 2017-03-01 NOTE — ED Triage Notes (Signed)
PT presents reporting SOB x 3 days with nonproductive cough. Pt reports having the same  On April 24th and was seen by PCP and given steroids that decreased symptoms. PCP performed CXR that was - for pneumonia. Pt able to speak in complete sentences in triage but has increased WOB. Oxygen saturation is 99%. No fevers at home. Pt reports having no relief from breathing treatments at home.   Pt shaking but also reports increased anxiety at this time.

## 2017-03-01 NOTE — Discharge Instructions (Signed)

## 2017-03-02 ENCOUNTER — Telehealth: Payer: Self-pay | Admitting: Pulmonary Disease

## 2017-03-02 MED ORDER — BUDESONIDE-FORMOTEROL FUMARATE 160-4.5 MCG/ACT IN AERO: 2.0000 | INHALATION_SPRAY | Freq: Two times a day (BID) | RESPIRATORY_TRACT | 5 refills | Status: DC

## 2017-03-02 NOTE — Telephone Encounter (Signed)
Called and spoke with pt and she stated that the bevespi is not working for her.  She wanted to see if she could go back to symbicort.  She was seen in the ER yesterday and was given IV steroids and 3 breathing txs.  She is wanting to get some recs on this.  She stated that we can leave a detailed message for her.  TP  please advise since BQ is out today.

## 2017-03-02 NOTE — Telephone Encounter (Signed)
Thanks for calling and letting us know Yes she can go back to Symbicort. 2 puff Twice daily   Make sure she has refills Make sure she has follow up with her MD here for follow up  Please contact office for sooner follow up if symptoms do not improve or worsen or seek emergency care

## 2017-03-02 NOTE — Telephone Encounter (Signed)
BQ  Please Advise-  Please see previous messages. Pt has a f/u appt on 6/21. Please advise if see needs to be sooner or that f/u appt is ok. Pt requests to only see you if sooner appt is needed, but you do not have any open slots.     FYI to nurse: rx for Symbicort was sent in

## 2017-03-05 NOTE — Telephone Encounter (Signed)
Left detailed message on vm advising pt to keep scheduled rov unless she feels ill, in which case she will need to be seen sooner with available provider.

## 2017-03-05 NOTE — Telephone Encounter (Signed)
6/21 OK

## 2017-03-17 ENCOUNTER — Emergency Department: Payer: Medicare Other

## 2017-03-17 ENCOUNTER — Emergency Department
Admission: EM | Admit: 2017-03-17 | Discharge: 2017-03-17 | Disposition: A | Discharge status: Home / Self Care | Payer: Medicare Other | Attending: Emergency Medicine | Admitting: Emergency Medicine

## 2017-03-17 ENCOUNTER — Encounter: Payer: Self-pay | Admitting: Emergency Medicine

## 2017-03-17 DIAGNOSIS — I252 Old myocardial infarction: Secondary | ICD-10-CM | POA: Insufficient documentation

## 2017-03-17 DIAGNOSIS — J189 Pneumonia, unspecified organism: Secondary | ICD-10-CM | POA: Diagnosis not present

## 2017-03-17 DIAGNOSIS — Z79899 Other long term (current) drug therapy: Secondary | ICD-10-CM | POA: Insufficient documentation

## 2017-03-17 DIAGNOSIS — Z87891 Personal history of nicotine dependence: Secondary | ICD-10-CM | POA: Diagnosis not present

## 2017-03-17 DIAGNOSIS — K219 Gastro-esophageal reflux disease without esophagitis: Secondary | ICD-10-CM

## 2017-03-17 DIAGNOSIS — J449 Chronic obstructive pulmonary disease, unspecified: Secondary | ICD-10-CM | POA: Insufficient documentation

## 2017-03-17 DIAGNOSIS — R079 Chest pain, unspecified: Secondary | ICD-10-CM | POA: Diagnosis present

## 2017-03-17 DIAGNOSIS — J181 Lobar pneumonia, unspecified organism: Secondary | ICD-10-CM

## 2017-03-17 LAB — COMPREHENSIVE METABOLIC PANEL
ALBUMIN: 3.9 g/dL (ref 3.5–5.0)
ALT: 36 U/L (ref 14–54)
ANION GAP: 9 (ref 5–15)
AST: 84 U/L — AB (ref 15–41)
Alkaline Phosphatase: 132 U/L — ABNORMAL HIGH (ref 38–126)
BUN: 8 mg/dL (ref 6–20)
CHLORIDE: 96 mmol/L — AB (ref 101–111)
CO2: 30 mmol/L (ref 22–32)
Calcium: 8.7 mg/dL — ABNORMAL LOW (ref 8.9–10.3)
Creatinine, Ser: 0.86 mg/dL (ref 0.44–1.00)
GFR calc Af Amer: >60 mL/min (ref >60)
GFR calc non Af Amer: >60 mL/min (ref >60)
GLUCOSE: 120 mg/dL — AB (ref 65–99)
POTASSIUM: 3.9 mmol/L (ref 3.5–5.1)
SODIUM: 135 mmol/L (ref 135–145)
TOTAL PROTEIN: 7.5 g/dL (ref 6.5–8.1)
Total Bilirubin: 0.5 mg/dL (ref 0.3–1.2)

## 2017-03-17 LAB — CBC WITH DIFFERENTIAL/PLATELET
BASOS ABS: 0 10*3/uL (ref 0–0.1)
BASOS PCT: 0 %
EOS ABS: 0.1 10*3/uL (ref 0–0.7)
EOS PCT: 1 %
HCT: 37.7 % (ref 35.0–47.0)
Hemoglobin: 12.8 g/dL (ref 12.0–16.0)
Lymphocytes Relative: 12 %
Lymphs Abs: 1.2 10*3/uL (ref 1.0–3.6)
MCH: 32.1 pg (ref 26.0–34.0)
MCHC: 33.9 g/dL (ref 32.0–36.0)
MCV: 94.9 fL (ref 80.0–100.0)
MONO ABS: 0.8 10*3/uL (ref 0.2–0.9)
MONOS PCT: 8 %
NEUTROS ABS: 8.1 10*3/uL — AB (ref 1.4–6.5)
Neutrophils Relative %: 79 %
PLATELETS: 291 10*3/uL (ref 150–440)
RBC: 3.97 MIL/uL (ref 3.80–5.20)
RDW: 13.3 % (ref 11.5–14.5)
WBC: 10.2 10*3/uL (ref 3.6–11.0)

## 2017-03-17 LAB — TROPONIN I: Troponin I: <0.03 ng/mL (ref <0.03)

## 2017-03-17 MED ORDER — DOXYCYCLINE HYCLATE 100 MG PO TABS: 100.0000 mg | ORAL_TABLET | Freq: Once | ORAL | Status: DC

## 2017-03-17 MED ORDER — IOPAMIDOL (ISOVUE-300) INJECTION 61%
75.0000 mL | Freq: Once | INTRAVENOUS | Status: AC | PRN
  Administered 2017-03-17: 75 mL via INTRAVENOUS

## 2017-03-17 MED ORDER — AMOXICILLIN-POT CLAVULANATE 875-125 MG PO TABS
1.0000 | ORAL_TABLET | Freq: Once | ORAL | Status: AC
  Administered 2017-03-17: 1 via ORAL
  Filled 2017-03-17: qty 1

## 2017-03-17 MED ORDER — AMOXICILLIN-POT CLAVULANATE 875-125 MG PO TABS
1.0000 | ORAL_TABLET | Freq: Two times a day (BID) | ORAL | 0 refills | Status: AC
Start: 2017-03-17 — End: 2017-03-24

## 2017-03-17 NOTE — ED Provider Notes (Signed)
[facility] Emergency Department Provider Note  ____________________________________________  Time seen: Approximately 12:12 PM  I have reviewed the triage vital signs and the nursing notes.   HISTORY  Chief Complaint Chest Pain   HPI Heidi [name] is a 61 y.o. female with a history of COPD, severe GERD, and Takotsubo cardiomyopathy who presents for evaluation of chest pain. Patient reports that the pain started this morning at 9 AM. She had not eaten anything. She had hotdogs for dinner last night. She describes the pain as severe, burning in quality, located in the center of her chest, constant and nonradiating. She took Maaloxwhich helped the pain however the pain recurred. Currently she has no pain. She endorses mild shortness of breath while the pain was on but that has resolved. No COPD exacerbation. No URI symptoms. No fever or chills. No nausea or vomiting. No abdominal pain. Patient denies alcohol or NSAIDs. Patient stopped smoking 4 years ago. She reports that she has these episodes once a month. She is on omeprazole and Maalox.  Past Medical History:  Diagnosis Date  . COPD (chronic obstructive pulmonary disease) (HCC)   . GERD (gastroesophageal reflux disease)   . Hypotension    limiting med titration  . NSTEMI (non-ST elevated myocardial infarction) (HCC)    12/2011 with normal coronaries possibly secondary to Takotsubo (EF 30-35% by echo), occurred following two episodes of acute respiratory distress  . 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 Sepsis due to pneumonia (HCC) 12/02/2016  . COPD (chronic obstructive pulmonary disease) (HCC) 01/13/2016  . Near syncope 12/01/2015  . Aortic atherosclerosis (HCC) 12/01/2015  . CAP (community acquired pneumonia) 01/05/2015  . Ex-smoker 07/14/2014  . Allergic rhinitis 03/03/2014  . Anxiety 04/10/2013  . Hoarseness 10/10/2012  . Acute respiratory failure  (HCC) 08/08/2012  . Acute encephalopathy 08/08/2012  . COPD with acute exacerbation (HCC) 08/08/2012  . Lactic acidosis 08/08/2012  . Chronic respiratory failure (HCC) 03/27/2012  . Takotsubo cardiomyopathy 01/31/2012  . Chest pain 01/23/2012  . TINGLING 05/26/2010  . SKIN LESION 11/08/2009  . ASCUS PAP 08/02/2009  . VAG HIGH RISK HUMAN PAPILLOMAVIRUS DNA TEST POS 08/02/2009  . DEPRESSION, CHRONIC with anxiety 06/22/2009  . MICROSCOPIC HEMATURIA 06/22/2009  . Smoking history 04/06/2009  . BRUISE 04/06/2009  . URI 10/09/2007  . HYSTERECTOMY, PARTIAL, HX OF 07/01/2007  . COPD exacerbation (HCC) 06/18/2007  . OSTEOPENIA 06/18/2007  . 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 ABDOMINAL HYSTERECTOMY    . CARDIAC CATHETERIZATION    . LEFT HEART CATHETERIZATION WITH CORONARY ANGIOGRAM N/A 01/23/2012   Procedure: LEFT HEART CATHETERIZATION WITH CORONARY ANGIOGRAM;  Surgeon: Dolores Patty, MD;  Location: [facility] CATH LAB;  Service: Cardiovascular;  Laterality: N/A;  . vagina rectal repair     after childbirth    Prior to Admission medications   Medication Sig Start Date End Date Taking? Authorizing Provider  acetaminophen (TYLENOL) 325 MG tablet Take 650 mg by mouth every 6 (six) hours as needed for mild pain or moderate pain.    [provider]  albuterol (PROVENTIL HFA;VENTOLIN HFA) 108 (90 Base) MCG/ACT inhaler Inhale 1-2 puffs into the lungs every 4 (four) hours as needed for wheezing or shortness of breath.    [provider]  albuterol (PROVENTIL) (2.5 MG/3ML) 0.083% nebulizer solution Take 3 mLs (2.5 mg total) by nebulization every 6 (six) hours as  needed for wheezing or shortness of breath. 03/01/17   Nita SickleVeronese, [place], MD  ALPRAZolam Prudy Feeler(XANAX) 0.25 MG tablet Take 0.25 mg by mouth 2 (two) times daily as needed for anxiety.     [provider]  aluminum-magnesium hydroxide-simethicone (MAALOX) 200-200-20  MG/5ML SUSP Take 30 mLs by mouth 4 (four) times daily -  before meals and at bedtime. 02/21/17   Sharman CheekStafford, Phillip, MD  amoxicillin-clavulanate (AUGMENTIN) 875-125 MG tablet Take 1 tablet by mouth 2 (two) times daily. 03/17/17 03/24/17  Nita SickleVeronese, [place], MD  budesonide-formoterol [facility](SYMBICORT) 160-4.5 MCG/ACT inhaler Inhale 2 puffs into the lungs 2 (two) times daily. 03/02/17   Parrett, Virgel Bouquetammy S, NP  chlorpheniramine-HYDROcodone (TUSSIONEX PENNKINETIC ER) 10-8 MG/5ML SUER Take 5 mLs by mouth at bedtime as needed for cough. 03/01/17   Don PerkingVeronese, [place], MD  escitalopram (LEXAPRO) 20 MG tablet Take 20 mg by mouth daily.     [provider]  famotidine (PEPCID) 20 MG tablet Take 1 tablet (20 mg total) by mouth 2 (two) times daily. 02/21/17   Sharman CheekStafford, Phillip, MD  fluticasone [facility](FLONASE) 50 MCG/ACT nasal spray USE TWO SPRAY(S) IN EACH NOSTRIL ONCE DAILY 02/09/17   Lupita LeashMcQuaid, Douglas B, MD  Glycopyrrolate-Formoterol (BEVESPI AEROSPHERE) 9-4.8 MCG/ACT AERO Inhale 2 puffs into the lungs 2 (two) times daily. 02/20/17   Parrett, Virgel Bouquetammy S, NP  guaiFENesin-dextromethorphan (ROBITUSSIN DM) 100-10 MG/5ML syrup Take 5 mLs by mouth every 6 (six) hours as needed for cough. 12/04/16   Altamese DillingVachhani, Vaibhavkumar, MD  metoCLOPramide (REGLAN) 10 MG tablet Take 1 tablet (10 mg total) by mouth every 6 (six) hours as needed. 02/21/17   Sharman CheekStafford, Phillip, MD  OXYGEN Inhale 2 L into the lungs as needed.    [provider]  pantoprazole (PROTONIX) 40 MG tablet Take 40 mg by mouth 2 (two) times daily as needed. Take 30-60 min before first meal of the day  01/06/13   Nyoka CowdenWert, Michael B, MD    Allergies Codeine; Levaquin [levofloxacin in d5w]; Propranolol; and Zoloft [sertraline hcl]  Family History  Problem Relation Age of Onset  . Heart attack Mother        deceased from massive MI at age 61  . Liver disease Father        fatty liver; living, age 61  . Stroke Brother        at age 10056, living   . Hypertension Brother         living, age 61    Social History Social History  Substance Use Topics  . Smoking status: Former Smoker    Packs/day: 1.00    Years: 30.00    Types: Cigarettes    Quit date: 01/21/2012  . Smokeless tobacco: Never Used  . Alcohol use Yes     Comment: 1-2 beers per week    Review of Systems  Constitutional: Negative for fever. Eyes: Negative for visual changes. ENT: Negative for sore throat. Neck: No neck pain  Cardiovascular: + burning chest pain. Respiratory: Negative for shortness of breath. Gastrointestinal: Negative for abdominal pain, vomiting or diarrhea. Genitourinary: Negative for dysuria. Musculoskeletal: Negative for back pain. Skin: Negative for rash. Neurological: Negative for headaches, weakness or numbness. Psych: No SI or HI  ____________________________________________   PHYSICAL EXAM:  VITAL SIGNS: Vitals:   03/17/17 1430 03/17/17 1500  BP: 123/74 117/71  Pulse: 82 83  Resp: 16 20   Constitutional: Alert and oriented. Well appearing and in no apparent distress. HEENT:      Head: Normocephalic and atraumatic.  Eyes: Conjunctivae are normal. Sclera is non-icteric.       Mouth/Throat: Mucous membranes are moist.       Neck: Supple with no signs of meningismus. Cardiovascular: Regular rate and rhythm. No murmurs, gallops, or rubs. 2+ symmetrical distal pulses are present in all extremities. No JVD. Respiratory: Normal respiratory effort. Lungs are clear to auscultation bilaterally. No wheezes, crackles, or rhonchi.  Gastrointestinal: Soft, non tender, and non distended with positive bowel sounds. No rebound or guarding. Musculoskeletal: Nontender with normal range of motion in all extremities. No edema, cyanosis, or erythema of extremities. Neurologic: Normal speech and language. Face is symmetric. Moving all extremities. No gross focal neurologic deficits are appreciated. Skin: Skin is warm, dry and intact. No rash noted. Psychiatric: Mood and  affect are normal. Speech and behavior are normal.  ____________________________________________   LABS (all labs ordered are listed, but only abnormal results are displayed)  Labs Reviewed  CBC WITH DIFFERENTIAL/PLATELET - Abnormal; Notable for the following:       Result Value   Neutro Abs 8.1 (*)    All other components within normal limits  COMPREHENSIVE METABOLIC PANEL - Abnormal; Notable for the following:    Chloride 96 (*)    Glucose, Bld 120 (*)    Calcium 8.7 (*)    AST 84 (*)    Alkaline Phosphatase 132 (*)    All other components within normal limits  TROPONIN I  TROPONIN I   ____________________________________________  EKG  ED ECG REPORT I, Nita Sickle, the attending physician, personally viewed and interpreted this ECG.  Normal sinus rhythm, rate of 91, normal intervals, normal axis, no ST elevations or depressions. ____________________________________________  RADIOLOGY  CXR: New ill-defined opacity in the right upper lobe as described. This may represent focal pneumonia. Underlying mass is not excluded. Followup PA and lateral chest X-ray is recommended in 3-4 weeks following trial of antibiotic therapy to ensure resolution and exclude underlying malignancy.  CT chest: 1. New small area of reticulonodular opacity in the peripheral right lower lobe. This is most likely inflammatory. It was not present on the prior chest CT. This does not correspond in location to the vague opacity noted on the current chest radiograph. 2. No discrete lung mass or nodule or focal lung opacity is seen to correspond to the chest radiographic abnormality. 3. No other change from the prior chest CT. 4. Advanced emphysema. No evidence of malignancy. ____________________________________________   PROCEDURES  Procedure(s) performed: None Procedures Critical Care performed:  None ____________________________________________   INITIAL IMPRESSION / ASSESSMENT AND  PLAN / ED COURSE  61 y.o. female with a history of COPD, severe GERD, and Takotsubo cardiomyopathy who presents for evaluation of chest burning pain which has now resolved. EKG with no ischemic changes. Patient has no history of coronary artery disease. No longer smoking. No evidence of COPD exacerbation on my exam. Presentation is most likely concerning for GERD however based on patient's age and comorbidities will monitor and cycle cardiac enzymes.  ----------------------------------------- 12:35 PM on 03/17/2017 -----------------------------------------   OBSERVATION CARE: This patient is being placed under observation care for the following reasons: Chest pain with repeat testing to rule out ischemia  _________________________ 2:38 PM on 03/17/2017 -----------------------------------------  Patient remains well appearing and chest pain-free. Chest x-ray concerning for a right upper lobe opacity and recommended CT of her chest. CT was done which did not show any masses on the RUL however was concerning for RLL opacity thought to be inconcerning for pneumonia. Patient  does endorse a cough but no fever or chills and has normal white count here. This is possibly an aspiration event from her GERD. Will give abx. Second troponin is due at 3PM  ----------------------------------------- 4:17 PM on 03/17/2017 -----------------------------------------   END OF OBSERVATION STATUS: After an appropriate period of observation, this patient is being discharged due to the following reason(s):  Patient's remaining pain-free. Troponin 2 negative. She is going to be discharged home on Augmentin for possible aspiration pneumonia.     Pertinent labs & imaging results that were available during my care of the patient were reviewed by me and considered in my medical decision making (see chart for details).    ____________________________________________   FINAL CLINICAL IMPRESSION(S) / ED  DIAGNOSES  Final diagnoses:  Community acquired pneumonia of right lower lobe of lung (HCC)  Gastroesophageal reflux disease, esophagitis presence not specified      NEW MEDICATIONS STARTED DURING THIS VISIT:  New Prescriptions   AMOXICILLIN-CLAVULANATE (AUGMENTIN) 875-125 MG TABLET    Take 1 tablet by mouth 2 (two) times daily.     Note:  This document was prepared using Dragon voice recognition software and may include unintentional dictation errors.    Nita Sickle, MD 03/17/17 [phone]

## 2017-03-17 NOTE — ED Triage Notes (Addendum)
Pt arrived by EMS from home with c/o of "burning" chest pain that was sudden onset around 9am. Pt was recently seen for the same. Pt has a hx of "severe Genella RifeGerd" but states this pain was "more intense and sudden" and pt also had c/o of SOB. EMS administered 324 of aspirin, 1 nitro without relief and 1 albuterol neb.

## 2017-03-17 NOTE — ED Notes (Signed)
Patient transported to CT 

## 2017-03-17 NOTE — ED Notes (Signed)
Pt verbalized understanding of discharge instructions. NAD at this time. 

## 2017-03-17 NOTE — Discharge Instructions (Signed)

## 2017-04-09 ENCOUNTER — Other Ambulatory Visit: Payer: Self-pay | Admitting: Nurse Practitioner

## 2017-04-09 DIAGNOSIS — R1011 Right upper quadrant pain: Secondary | ICD-10-CM

## 2017-04-09 DIAGNOSIS — R748 Abnormal levels of other serum enzymes: Secondary | ICD-10-CM

## 2017-04-12 ENCOUNTER — Other Ambulatory Visit: Payer: Self-pay | Admitting: Pulmonary Disease

## 2017-04-16 ENCOUNTER — Ambulatory Visit
Admission: RE | Admit: 2017-04-16 | Discharge: 2017-04-16 | Disposition: A | Discharge status: Home / Self Care | Payer: Medicare Other | Source: Ambulatory Visit | Attending: Nurse Practitioner | Admitting: Nurse Practitioner

## 2017-04-16 DIAGNOSIS — R748 Abnormal levels of other serum enzymes: Secondary | ICD-10-CM | POA: Insufficient documentation

## 2017-04-16 DIAGNOSIS — R1011 Right upper quadrant pain: Secondary | ICD-10-CM

## 2017-04-16 DIAGNOSIS — R932 Abnormal findings on diagnostic imaging of liver and biliary tract: Secondary | ICD-10-CM | POA: Diagnosis not present

## 2017-04-16 DIAGNOSIS — K8020 Calculus of gallbladder without cholecystitis without obstruction: Secondary | ICD-10-CM | POA: Diagnosis not present

## 2017-04-19 ENCOUNTER — Ambulatory Visit (INDEPENDENT_AMBULATORY_CARE_PROVIDER_SITE_OTHER): Payer: Medicare Other | Admitting: Pulmonary Disease

## 2017-04-19 ENCOUNTER — Encounter: Payer: Self-pay | Admitting: Pulmonary Disease

## 2017-04-19 VITALS — BP 118/70 | HR 108 | Ht 66.0 in | Wt 121.0 lb

## 2017-04-19 DIAGNOSIS — J441 Chronic obstructive pulmonary disease with (acute) exacerbation: Secondary | ICD-10-CM | POA: Diagnosis not present

## 2017-04-19 DIAGNOSIS — R911 Solitary pulmonary nodule: Secondary | ICD-10-CM | POA: Diagnosis not present

## 2017-04-19 DIAGNOSIS — J9611 Chronic respiratory failure with hypoxia: Secondary | ICD-10-CM

## 2017-04-19 DIAGNOSIS — J432 Centrilobular emphysema: Secondary | ICD-10-CM

## 2017-04-19 MED ORDER — IPRATROPIUM-ALBUTEROL 20-100 MCG/ACT IN AERS: 1.0000 | INHALATION_SPRAY | Freq: Four times a day (QID) | RESPIRATORY_TRACT | 11 refills | Status: DC

## 2017-04-19 NOTE — Patient Instructions (Signed)
For your COPD: Keep taking Symbicort 2 puffs twice a day Stay active, exercise regularly  For your pulmonary nodule: We will repeat a CT scan of your chest in May 2019  For your upcoming endoscopy: It's okay for you to have the endoscopy, but you need to make sure that you use your Symbicort the morning of the procedure  We will see you back in 4 months or sooner if needed

## 2017-04-19 NOTE — Progress Notes (Signed)
Subjective:    Patient ID: Heidi [name], female    DOB: Jul 03, 1956, 61 y.o.   MRN: [id number]  Synopsis: Heidi [name] first saw Dr. Sherene SiresWert with the [facility] in 2013. She has COPD. She smoked heavily up until October of 2013.  She had bad mouth sores from Spiriva.  HPI  Chief Complaint  Patient presents with  . Follow-up    pt states she is doing well, notes sob with exertion.  states she had a prod cough Xfew weeks ago but took some mucinex which helped with this.  Needs sx clearance for endoscopy.     Heidi [name] is supposed to have an endoscopy sometime soon but she has been told she needs to have a pulmonary clearance.   Her breathing has been OK as long as she stays out of the heat.  She says that she is coughing up some mucus which but this improves with mucinex.  She didn't think the Bivespi helped so she switched back to Bivespi.   Past Medical History:  Diagnosis Date  . COPD (chronic obstructive pulmonary disease) (HCC)   . GERD (gastroesophageal reflux disease)   . Hypotension    limiting med titration  . NSTEMI (non-ST elevated myocardial infarction) (HCC)    12/2011 with normal coronaries possibly secondary to Takotsubo (EF 30-35% by echo), occurred following two episodes of acute respiratory distress  . 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  HENT: Negative for congestion, postnasal drip, rhinorrhea and sinus pressure.   Respiratory: Positive for shortness of breath. Negative for cough and wheezing.   Cardiovascular: Negative for chest pain, palpitations and leg swelling.       Objective:   Physical Exam Vitals:   04/19/17 1203  BP: 118/70  Pulse: (!) 108  SpO2: 95%  Weight: 121 lb (54.9 kg)  Height: 5\' 6"  (1.676 m)  RA  Walked 400 feet on RA and her O2 saturation dropped from 99% to 92%  Gen: chronically ill appearing HENT: OP clear, TM's clear, neck supple PULM:  Poor air movement, no wheezing B, normal percussion CV: RRR, no mgr, trace edema GI: BS+, soft, nontender Derm: no cyanosis or rash Psyche: normal mood and affect   PFT: 2013 FEV1 was 0.72 L (27% predicted)  Chest imaging: May 2018 CT chest images and apparently reviewed showing moderate to severe centrilobular emphysema bilaterally, there is a small lobulated nodule adjacent to the pleura in the right upper lobe, approximately 4mm in size  Records from [facility] department in May 2018 reviewed. She had chest pain. There is no evidence of ischemia on workup  On her last visit with us records so that she was changed to Bivespi but she called back reporting that it was not helpful so phone note records show that she was changed back to Symbicort.  CBC    Component Value Date/Time   WBC 10.2 03/17/2017 1205   RBC 3.97 03/17/2017 1205   HGB 12.8 03/17/2017 1205   HGB 12.4 11/29/2014 0439   HCT 37.7 03/17/2017 1205   HCT 37.6 11/29/2014 0439   PLT 291 03/17/2017 1205   PLT 293 11/29/2014 0439   MCV 94.9 03/17/2017 1205   MCV 99 11/29/2014 0439   MCH 32.1 03/17/2017 1205   MCHC 33.9 03/17/2017 1205   RDW 13.3 03/17/2017 1205   RDW 13.1 11/29/2014 0439   LYMPHSABS 1.2  03/17/2017 1205   LYMPHSABS 0.8 (L) 11/29/2014 0439   MONOABS 0.8 03/17/2017 1205   MONOABS 0.1 (L) 11/29/2014 0439   EOSABS 0.1 03/17/2017 1205   EOSABS 0.0 11/29/2014 0439   BASOSABS 0.0 03/17/2017 1205   BASOSABS 0.0 11/29/2014 0439       Assessment & Plan:   Impression: COPD with acute exacerbation (HCC) - Plan: Spirometry with Graph  Chronic respiratory failure with hypoxia (HCC)  Centrilobular emphysema (HCC)  Discussion: This is been a stable interval for Heidi [name], though she proved once again that she is intolerant of medications which use inhaled muscarinic agonist. She seems to do best on Symbicort so we will keep her on that.  She has an  upcoming endoscopy: She is at moderate risk for a perioperative pulmonary complication (~5% risk) but as she survived orthopedic surgery a few months back I see no reason to withhold endoscopy.   Plan: For your COPD: Spirometry today Ambulatory oxygenation monitoring today Keep taking Symbicort 2 puffs twice a day Stay active, exercise regularly  For your pulmonary nodule: We will repeat a CT scan of your chest in May 2019  For your upcoming endoscopy: It's okay for you to have the endoscopy, but you need to make sure that you use your Symbicort the morning of the procedure  We will see you back in 4 months or sooner if needed   Updated Medication List Outpatient Encounter Prescriptions as of 04/19/2017  Medication Sig  . acetaminophen (TYLENOL) 325 MG tablet Take 650 mg by mouth every 6 (six) hours as needed for mild pain or moderate pain.  Marland Kitchen albuterol (PROVENTIL HFA;VENTOLIN HFA) 108 (90 Base) MCG/ACT inhaler Inhale 1-2 puffs into the lungs every 4 (four) hours as needed for wheezing or shortness of breath.  Marland Kitchen albuterol (PROVENTIL) (2.5 MG/3ML) 0.083% nebulizer solution Take 3 mLs (2.5 mg total) by nebulization every 6 (six) hours as needed for wheezing or shortness of breath.  . ALPRAZolam (XANAX) 0.25 MG tablet Take 0.25 mg by mouth 2 (two) times daily as needed for anxiety.   Marland Kitchen aluminum-magnesium hydroxide-simethicone (MAALOX) 200-200-20 MG/5ML SUSP Take 30 mLs by mouth 4 (four) times daily -  before meals and at bedtime.  . budesonide-formoterol (SYMBICORT) 160-4.5 MCG/ACT inhaler Inhale 2 puffs into the lungs 2 (two) times daily.  . chlorpheniramine-HYDROcodone (TUSSIONEX PENNKINETIC ER) 10-8 MG/5ML SUER Take 5 mLs by mouth at bedtime as needed for cough.  . escitalopram (LEXAPRO) 20 MG tablet Take 20 mg by mouth daily.   . famotidine (PEPCID) 20 MG tablet Take 1 tablet (20 mg total) by mouth 2 (two) times daily.  . fluticasone (FLONASE) 50 MCG/ACT nasal spray USE TWO SPRAY(S) IN  EACH NOSTRIL ONCE DAILY  . Glycopyrrolate-Formoterol (BEVESPI AEROSPHERE) 9-4.8 MCG/ACT AERO Inhale 2 puffs into the lungs 2 (two) times daily.  Marland Kitchen guaiFENesin-dextromethorphan (ROBITUSSIN DM) 100-10 MG/5ML syrup Take 5 mLs by mouth every 6 (six) hours as needed for cough.  . metoCLOPramide (REGLAN) 10 MG tablet Take 1 tablet (10 mg total) by mouth every 6 (six) hours as needed.  . OXYGEN Inhale 2 L into the lungs as needed.  . pantoprazole (PROTONIX) 40 MG tablet Take 40 mg by mouth 2 (two) times daily as needed. Take 30-60 min before first meal of the day    No facility-administered encounter medications on file as of 04/19/2017.

## 2017-04-27 ENCOUNTER — Other Ambulatory Visit: Payer: Self-pay | Admitting: Nurse Practitioner

## 2017-04-27 DIAGNOSIS — K8020 Calculus of gallbladder without cholecystitis without obstruction: Secondary | ICD-10-CM

## 2017-04-27 DIAGNOSIS — R935 Abnormal findings on diagnostic imaging of other abdominal regions, including retroperitoneum: Secondary | ICD-10-CM

## 2017-05-03 ENCOUNTER — Other Ambulatory Visit: Payer: Self-pay | Admitting: Pulmonary Disease

## 2017-05-04 ENCOUNTER — Telehealth: Payer: Self-pay

## 2017-05-04 NOTE — Telephone Encounter (Signed)
Attempted PA for Combivent through [facility]. The CSR informed me that the pt has Part D prescription coverage and the Rx should be run through that insurance first. I called the pharmacy and they do not have this card or information on file. I called pt to see if she could take them her card but no answer. Left message on her VM to call back or to bring her insurance card to the pharmacy. I will forward to [place] to follow up since she is  BQ pt. Thanks.

## 2017-05-10 ENCOUNTER — Ambulatory Visit: Payer: Medicare Other | Admitting: Cardiovascular Disease

## 2017-05-11 ENCOUNTER — Encounter
Admission: RE | Admit: 2017-05-11 | Discharge: 2017-05-11 | Disposition: A | Discharge status: Home / Self Care | Payer: Medicare Other | Source: Ambulatory Visit | Attending: Nurse Practitioner | Admitting: Nurse Practitioner

## 2017-05-11 ENCOUNTER — Other Ambulatory Visit: Payer: Self-pay | Admitting: Nurse Practitioner

## 2017-05-11 DIAGNOSIS — K8020 Calculus of gallbladder without cholecystitis without obstruction: Secondary | ICD-10-CM

## 2017-05-11 DIAGNOSIS — R935 Abnormal findings on diagnostic imaging of other abdominal regions, including retroperitoneum: Secondary | ICD-10-CM | POA: Diagnosis present

## 2017-05-11 MED ORDER — TECHNETIUM TC 99M MEBROFENIN IV KIT
5.0000 | PACK | Freq: Once | INTRAVENOUS | Status: AC | PRN
  Administered 2017-05-11: 5.12 via INTRAVENOUS

## 2017-05-11 MED ORDER — IPRATROPIUM-ALBUTEROL 20-100 MCG/ACT IN AERS: 1.0000 | INHALATION_SPRAY | Freq: Four times a day (QID) | RESPIRATORY_TRACT | 11 refills | Status: DC

## 2017-05-11 NOTE — Telephone Encounter (Signed)
Spoke with pt. States that this prescription was sent to the wrong [facility]. [facility] [place], they have all of her prescription drug coverage info. Rx has been resent to the correct pharmacy. Advised pt to let us know if there any further issues. Nothing further was needed.

## 2017-05-17 ENCOUNTER — Encounter: Payer: Self-pay | Admitting: Cardiovascular Disease

## 2017-05-17 ENCOUNTER — Ambulatory Visit (INDEPENDENT_AMBULATORY_CARE_PROVIDER_SITE_OTHER): Payer: Medicare Other | Admitting: Cardiovascular Disease

## 2017-05-17 VITALS — BP 116/76 | HR 92 | Ht 66.0 in | Wt 121.5 lb

## 2017-05-17 DIAGNOSIS — R55 Syncope and collapse: Secondary | ICD-10-CM | POA: Diagnosis not present

## 2017-05-17 DIAGNOSIS — J9611 Chronic respiratory failure with hypoxia: Secondary | ICD-10-CM

## 2017-05-17 DIAGNOSIS — Z87891 Personal history of nicotine dependence: Secondary | ICD-10-CM

## 2017-05-17 DIAGNOSIS — I700 Atherosclerosis of aorta: Secondary | ICD-10-CM | POA: Diagnosis not present

## 2017-05-17 DIAGNOSIS — J441 Chronic obstructive pulmonary disease with (acute) exacerbation: Secondary | ICD-10-CM

## 2017-05-17 NOTE — Progress Notes (Signed)
Cardiology Office Note  Date:  05/17/2017   ID:  Heidi [name], DOB 19-Jan-1956, MRN [id number]  PCP:  Leotis Shames, MD   Chief Complaint  Patient presents with  . other    Cardiac clearance. Meds reviewed verbally with pt.    HPI:  Heidi [name] is a pleasant 61 year old woman with long history of  smoking, quit 20214 severe COPD,  managed on inhalers,  CT scan showing aortic atherosclerosis  near syncope, lightheadedness, tachycardia Significant stressors at home previously on Lexapro, anxiety, Ativan as needed Normal ejection fraction 2014 Who presents for follow-up of her lightheadedness, tachycardia  Broke leg, oct 2017 Surgery, rod  Need gallbaldder surgery Dr. Katrinka Blazing Having attacks, seen in the ER CHRONIC CHOLECYSTITIS.  Advanced emphysema. On CT scan chest Reviewed by myself, no significant coronary calcifications, minimal / mild aortic atherosclerosis  Reports that she is doing well, and everything is stable SOB with heavy exertion, this is been a chronic issue Oxygen for COPD at night, breathing Rx   no chest pain on exertion Able to do her ADLs, shopping, housework Just paces herself if she gets tired or short of breath  EKG on today's visit shows sinus tachycardia with rate 92 bpm, no significant ST or T-wave changes  Prior records reviewed with her, cardiac catheterization 01/23/2012 showing no significant coronary artery disease Echocardiogram 2014 showing normal LV function, greater than 55%  PMH:   has a past medical history of COPD (chronic obstructive pulmonary disease) (HCC); GERD (gastroesophageal reflux disease); Hypotension; NSTEMI (non-ST elevated myocardial infarction) (HCC); and Takotsubo syndrome (4/13).  PSH:    Past Surgical History:  Procedure Laterality Date  . ABDOMINAL HYSTERECTOMY    . CARDIAC CATHETERIZATION    . LEFT HEART CATHETERIZATION WITH CORONARY ANGIOGRAM N/A 01/23/2012   Procedure: LEFT HEART CATHETERIZATION WITH  CORONARY ANGIOGRAM;  Surgeon: Dolores Patty, MD;  Location: [facility] CATH LAB;  Service: Cardiovascular;  Laterality: N/A;  . vagina rectal repair     after childbirth    Current Outpatient Prescriptions  Medication Sig Dispense Refill  . acetaminophen (TYLENOL) 325 MG tablet Take 650 mg by mouth every 6 (six) hours as needed for mild pain or moderate pain.    Marland Kitchen albuterol (PROVENTIL HFA;VENTOLIN HFA) 108 (90 Base) MCG/ACT inhaler Inhale 1-2 puffs into the lungs every 4 (four) hours as needed for wheezing or shortness of breath.    Marland Kitchen albuterol (PROVENTIL) (2.5 MG/3ML) 0.083% nebulizer solution Take 3 mLs (2.5 mg total) by nebulization every 6 (six) hours as needed for wheezing or shortness of breath. 75 mL 12  . ALPRAZolam (XANAX) 0.25 MG tablet Take 0.25 mg by mouth 2 (two) times daily as needed for anxiety.     Marland Kitchen aluminum-magnesium hydroxide-simethicone (MAALOX) 200-200-20 MG/5ML SUSP Take 30 mLs by mouth 4 (four) times daily -  before meals and at bedtime. 355 mL 0  . budesonide-formoterol (SYMBICORT) 160-4.5 MCG/ACT inhaler Inhale 2 puffs into the lungs 2 (two) times daily. 1 Inhaler 5  . chlorpheniramine-HYDROcodone (TUSSIONEX PENNKINETIC ER) 10-8 MG/5ML SUER Take 5 mLs by mouth at bedtime as needed for cough. 30 mL 0  . COMBIVENT RESPIMAT 20-100 MCG/ACT AERS respimat INHALE ONE PUFF BY MOUTH EVERY 6 HOURS AS NEEDED FOR WHEEZING 1 Inhaler 5  . escitalopram (LEXAPRO) 20 MG tablet Take 20 mg by mouth daily.     . famotidine (PEPCID) 20 MG tablet Take 1 tablet (20 mg total) by mouth 2 (two) times daily. 60 tablet 0  . fluticasone (  FLONASE) 50 MCG/ACT nasal spray USE TWO SPRAY(S) IN EACH NOSTRIL ONCE DAILY 16 g 5  . guaiFENesin-dextromethorphan (ROBITUSSIN DM) 100-10 MG/5ML syrup Take 5 mLs by mouth every 6 (six) hours as needed for cough. 118 mL 0  . Ipratropium-Albuterol (COMBIVENT RESPIMAT) 20-100 MCG/ACT AERS respimat Inhale 1 puff into the lungs every 6 (six) hours. 1 Inhaler 11  .  metoCLOPramide (REGLAN) 10 MG tablet Take 1 tablet (10 mg total) by mouth every 6 (six) hours as needed. 30 tablet 0  . OXYGEN Inhale 2 L into the lungs as needed.    . pantoprazole (PROTONIX) 40 MG tablet Take 40 mg by mouth 2 (two) times daily as needed. Take 30-60 min before first meal of the day      No current facility-administered medications for this visit.      Allergies:   Codeine; Levaquin [levofloxacin in d5w]; Propranolol; and Zoloft [sertraline hcl]   Social History:  The patient  reports that she quit smoking about 5 years ago. Her smoking use included Cigarettes. She has a 30.00 pack-year smoking history. She has never used smokeless tobacco. She reports that she drinks alcohol. She reports that she does not use drugs.   Family History:   family history includes Heart attack in her mother; Hypertension in her brother; Liver disease in her father; Stroke in her brother.    Review of Systems: Review of Systems  Constitutional: Negative.   Respiratory: Negative.   Cardiovascular: Negative.   Gastrointestinal: Negative.   Musculoskeletal: Negative.   Neurological: Negative.   Psychiatric/Behavioral: Negative.   All other systems reviewed and are negative.    PHYSICAL EXAM: VS:  BP 116/76 (BP Location: Right Arm, Patient Position: Sitting, Cuff Size: Normal)   Pulse 92   Ht 5\' 6"  (1.676 m)   Wt 121 lb 8 oz (55.1 kg)   BMI 19.61 kg/m  , BMI Body mass index is 19.61 kg/m. GEN: Well nourished, well developed, in no acute distress  HEENT: normal  Neck: no JVD, carotid bruits, or masses Cardiac: RRR; no murmurs, rubs, or gallops,no edema  Respiratory:  Moderately decreased breath sounds throughout, normal work of breathing GI: soft, nontender, nondistended, + BS MS: no deformity or atrophy  Skin: warm and dry, no rash Neuro:  Strength and sensation are intact Psych: euthymic mood, full affect    Recent Labs: 03/17/2017: ALT 36; BUN 8; Creatinine, Ser 0.86;  Hemoglobin 12.8; Platelets 291; Potassium 3.9; Sodium 135    Lipid Panel Lab Results  Component Value Date   CHOL 134 01/24/2012   HDL 80 01/24/2012   LDLCALC 41 01/24/2012   TRIG 65 01/24/2012      Wt Readings from Last 3 Encounters:  05/17/17 121 lb 8 oz (55.1 kg)  04/19/17 121 lb (54.9 kg)  03/17/17 120 lb (54.4 kg)       ASSESSMENT AND PLAN:  Aortic atherosclerosis (HCC) - Plan: EKG 12-Lead CT scan reviewed with minimal coronary calcifications Mild aortic stenosis on recent CT scan Stopped smoking 4 years ago  Near syncope - Plan: EKG 12-Lead Denies any symptoms, no further testing needed She feels previous symptoms were secondary to anxiety  Smoking history - Plan: EKG 12-Lead She stopped smoking 4 years ago  COPD exacerbation (HCC) - Plan: EKG 12-Lead Advanced emphysema on CT scan She uses oxygen at nighttime, chronic baseline shortness of breath  Preop cardiovascular Needs gallbladder surgery, acceptable risk no further testing needed No anginal symptoms, no significant coronary calcification seen on  CT scan   Total encounter time more than 25 minutes  Greater than 50% was spent in counseling and coordination of care with the patient    Disposition:   F/U  as needed   Orders Placed This Encounter  Procedures  . EKG 12-Lead     Signed, Dossie Arbourim Gollan, M.D., Ph.D. 05/17/2017  [facility], [place] [phone]

## 2017-05-17 NOTE — Patient Instructions (Addendum)
Medication Instructions:   Consider taking aspirin 81 mg daily  Labwork:  No new labs needed  Testing/Procedures:  No further testing at this time   Follow-Up: It was a pleasure seeing you in the office today. Please call us if you have new issues that need to be addressed before your next appt.  [phone]  Your physician wants you to follow-up in: 12 months as needed   If you need a refill on your cardiac medications before your next appointment, please call your pharmacy.

## 2017-05-29 ENCOUNTER — Ambulatory Visit
Admission: RE | Admit: 2017-05-29 | Discharge: 2017-05-29 | Disposition: A | Discharge status: Home / Self Care | Payer: Medicare Other | Source: Ambulatory Visit | Attending: Surgery | Admitting: Surgery

## 2017-05-29 DIAGNOSIS — Z01818 Encounter for other preprocedural examination: Secondary | ICD-10-CM | POA: Insufficient documentation

## 2017-05-29 HISTORY — DX: Pneumonia, unspecified organism: J18.9

## 2017-05-29 HISTORY — DX: Anxiety disorder, unspecified: F41.9

## 2017-05-29 NOTE — Pre-Procedure Instructions (Signed)
Cardiac clearance in front of chart.  EKG in epic 05/17/17.

## 2017-05-29 NOTE — Patient Instructions (Signed)
  Your procedure is scheduled on: Tuesday  June 05, 2017. Report to Same Day Surgery. To find out your arrival time please call [phone] between 1PM - 3PM on Monday June 04, 2017.  Remember: Instructions that are not followed completely may result in serious medical risk, up to and including death, or upon the discretion of your surgeon and anesthesiologist your surgery may need to be rescheduled.    _x___ 1. Do not eat food or drink liquids after midnight. No gum chewing or hard candies.     ____ 2. No Alcohol for 24 hours before or after surgery.   ____ 3. Bring all medications with you on the day of surgery if instructed.    __x__ 4. Notify your doctor if there is any change in your medical condition     (cold, fever, infections).    _____ 5. No smoking 24 hours prior to surgery.     Do not wear jewelry, make-up, hairpins, clips or nail polish.  Do not wear lotions, powders, or perfumes.   Do not shave 48 hours prior to surgery. Men may shave face and neck.  Do not bring valuables to the hospital.    [facility] is not responsible for any belongings or valuables.               Contacts, dentures or bridgework may not be worn into surgery.  Leave your suitcase in the car. After surgery it may be brought to your room.  For patients admitted to the hospital, discharge time is determined by your treatment team.   Patients discharged the day of surgery will not be allowed to drive home.    Please read over the following fact sheets that you were given:   [facility] Pre[facility]  __x__ Take these medicines the morning of surgery with A SIP OF WATER:    1. ALPRAZolam (XANAX)  2. escitalopram (LEXAPRO)  3. pantoprazole (PROTONIX) also take an extra dose the night before surgery.   ____ Fleet Enema (as directed)   _x___ Use CHG Soap as directed on instruction sheet  _x___ Use inhalers on the day of surgery and bring to hospital day of surgery  ____ Stop  metformin 2 days prior to surgery    ____ Take 1/2 of usual insulin dose the night before surgery and none on the morning of surgery.   ____ Stop Coumadin/Plavix/aspirin on does not apply.  __x__ Stop Anti-inflammatories such as Advil, Aleve, Ibuprofen, Motrin, Naproxen, Naprosyn, Goodies powders or  aspirin products. OK to take Tylenol.   ____ Stop supplements until after surgery.    ____ Bring C-Pap to the hospital.

## 2017-06-02 DIAGNOSIS — K8020 Calculus of gallbladder without cholecystitis without obstruction: Secondary | ICD-10-CM | POA: Insufficient documentation

## 2017-06-02 DIAGNOSIS — F411 Generalized anxiety disorder: Secondary | ICD-10-CM | POA: Insufficient documentation

## 2017-06-05 ENCOUNTER — Ambulatory Visit
Admission: RE | Admit: 2017-06-05 | Discharge: 2017-06-05 | Disposition: A | Discharge status: Home / Self Care | Payer: Medicare Other | Source: Ambulatory Visit | Attending: Surgery | Admitting: Surgery

## 2017-06-05 ENCOUNTER — Encounter: Payer: Self-pay | Admitting: Anesthesiology

## 2017-06-05 ENCOUNTER — Encounter
Admission: RE | Disposition: A | Discharge status: Home / Self Care | Payer: Self-pay | Source: Ambulatory Visit | Attending: Surgery

## 2017-06-05 ENCOUNTER — Ambulatory Visit: Payer: Medicare Other | Admitting: Registered Nurse

## 2017-06-05 DIAGNOSIS — F419 Anxiety disorder, unspecified: Secondary | ICD-10-CM | POA: Diagnosis not present

## 2017-06-05 DIAGNOSIS — J449 Chronic obstructive pulmonary disease, unspecified: Secondary | ICD-10-CM | POA: Insufficient documentation

## 2017-06-05 DIAGNOSIS — K219 Gastro-esophageal reflux disease without esophagitis: Secondary | ICD-10-CM | POA: Diagnosis not present

## 2017-06-05 DIAGNOSIS — Z87891 Personal history of nicotine dependence: Secondary | ICD-10-CM | POA: Diagnosis not present

## 2017-06-05 DIAGNOSIS — I252 Old myocardial infarction: Secondary | ICD-10-CM | POA: Diagnosis not present

## 2017-06-05 DIAGNOSIS — I739 Peripheral vascular disease, unspecified: Secondary | ICD-10-CM | POA: Insufficient documentation

## 2017-06-05 DIAGNOSIS — F329 Major depressive disorder, single episode, unspecified: Secondary | ICD-10-CM | POA: Insufficient documentation

## 2017-06-05 DIAGNOSIS — K8020 Calculus of gallbladder without cholecystitis without obstruction: Secondary | ICD-10-CM

## 2017-06-05 DIAGNOSIS — K8010 Calculus of gallbladder with chronic cholecystitis without obstruction: Secondary | ICD-10-CM | POA: Insufficient documentation

## 2017-06-05 HISTORY — PX: CHOLECYSTECTOMY: SHX55

## 2017-06-05 SURGERY — LAPAROSCOPIC CHOLECYSTECTOMY WITH INTRAOPERATIVE CHOLANGIOGRAM
Anesthesia: General | Site: Abdomen | Wound class: Clean Contaminated

## 2017-06-05 MED ORDER — MIDAZOLAM HCL 2 MG/2ML IJ SOLN
INTRAMUSCULAR | Status: DC | PRN
  Administered 2017-06-05: 2 mg via INTRAVENOUS

## 2017-06-05 MED ORDER — ROCURONIUM BROMIDE 100 MG/10ML IV SOLN
INTRAVENOUS | Status: DC | PRN
  Administered 2017-06-05: 20 mg via INTRAVENOUS
  Administered 2017-06-05: 30 mg via INTRAVENOUS

## 2017-06-05 MED ORDER — HYDROCODONE-ACETAMINOPHEN 5-325 MG PO TABS
1.0000 | ORAL_TABLET | ORAL | Status: DC | PRN
  Administered 2017-06-05: 1 via ORAL

## 2017-06-05 MED ORDER — HYDROCODONE-ACETAMINOPHEN 5-325 MG PO TABS: 1.0000 | ORAL_TABLET | ORAL | 0 refills | Status: DC | PRN

## 2017-06-05 MED ORDER — MIDAZOLAM HCL 2 MG/2ML IJ SOLN
INTRAMUSCULAR | Status: AC
  Filled 2017-06-05: qty 2

## 2017-06-05 MED ORDER — FENTANYL CITRATE (PF) 100 MCG/2ML IJ SOLN
INTRAMUSCULAR | Status: AC
  Filled 2017-06-05: qty 2

## 2017-06-05 MED ORDER — ONDANSETRON HCL 4 MG/2ML IJ SOLN
INTRAMUSCULAR | Status: DC | PRN
  Administered 2017-06-05: 4 mg via INTRAVENOUS

## 2017-06-05 MED ORDER — FENTANYL CITRATE (PF) 100 MCG/2ML IJ SOLN
INTRAMUSCULAR | Status: DC | PRN
  Administered 2017-06-05: 25 ug via INTRAVENOUS
  Administered 2017-06-05: 100 ug via INTRAVENOUS
  Administered 2017-06-05: 25 ug via INTRAVENOUS

## 2017-06-05 MED ORDER — SODIUM CHLORIDE 0.9 % IV SOLN
INTRAVENOUS | Status: DC | PRN
  Administered 2017-06-05: 11:00:00 via INTRAMUSCULAR

## 2017-06-05 MED ORDER — HYDROCODONE-ACETAMINOPHEN 5-325 MG PO TABS
ORAL_TABLET | ORAL | Status: AC
  Filled 2017-06-05: qty 1

## 2017-06-05 MED ORDER — LIDOCAINE HCL (PF) 2 % IJ SOLN
INTRAMUSCULAR | Status: AC
  Filled 2017-06-05: qty 2

## 2017-06-05 MED ORDER — DEXAMETHASONE SODIUM PHOSPHATE 10 MG/ML IJ SOLN
INTRAMUSCULAR | Status: AC
  Filled 2017-06-05: qty 1

## 2017-06-05 MED ORDER — PHENYLEPHRINE HCL 10 MG/ML IJ SOLN
INTRAMUSCULAR | Status: DC | PRN
  Administered 2017-06-05 (×2): 100 ug via INTRAVENOUS

## 2017-06-05 MED ORDER — LACTATED RINGERS IV SOLN
INTRAVENOUS | Status: DC
  Administered 2017-06-05: 11:00:00 via INTRAVENOUS

## 2017-06-05 MED ORDER — ONDANSETRON HCL 4 MG/2ML IJ SOLN
INTRAMUSCULAR | Status: AC
  Filled 2017-06-05: qty 2

## 2017-06-05 MED ORDER — PROPOFOL 10 MG/ML IV BOLUS
INTRAVENOUS | Status: AC
  Filled 2017-06-05: qty 20

## 2017-06-05 MED ORDER — FENTANYL CITRATE (PF) 100 MCG/2ML IJ SOLN
25.0000 ug | INTRAMUSCULAR | Status: DC | PRN
  Administered 2017-06-05 (×4): 25 ug via INTRAVENOUS

## 2017-06-05 MED ORDER — SUGAMMADEX SODIUM 200 MG/2ML IV SOLN
INTRAVENOUS | Status: DC | PRN
  Administered 2017-06-05: 150 mg via INTRAVENOUS

## 2017-06-05 MED ORDER — PROPOFOL 10 MG/ML IV BOLUS
INTRAVENOUS | Status: DC | PRN
  Administered 2017-06-05: 100 mg via INTRAVENOUS

## 2017-06-05 MED ORDER — DEXAMETHASONE SODIUM PHOSPHATE 10 MG/ML IJ SOLN
INTRAMUSCULAR | Status: DC | PRN
  Administered 2017-06-05: 10 mg via INTRAVENOUS

## 2017-06-05 MED ORDER — IOTHALAMATE MEGLUMINE 60 % INJ SOLN
INTRAMUSCULAR | Status: DC | PRN
  Administered 2017-06-05: 12:00:00

## 2017-06-05 MED ORDER — LIDOCAINE HCL (CARDIAC) 20 MG/ML IV SOLN
INTRAVENOUS | Status: DC | PRN
  Administered 2017-06-05: 80 mg via INTRAVENOUS

## 2017-06-05 MED ORDER — HEPARIN SODIUM (PORCINE) 5000 UNIT/ML IJ SOLN
INTRAMUSCULAR | Status: AC
  Filled 2017-06-05: qty 1

## 2017-06-05 MED ORDER — ONDANSETRON HCL 4 MG/2ML IJ SOLN: 4.0000 mg | Freq: Once | INTRAMUSCULAR | Status: DC | PRN

## 2017-06-05 MED ORDER — SUGAMMADEX SODIUM 200 MG/2ML IV SOLN
INTRAVENOUS | Status: AC
  Filled 2017-06-05: qty 2

## 2017-06-05 SURGICAL SUPPLY — 37 items
APPLIER CLIP ROT 10 11.4 M/L (STAPLE) ×3
BENZOIN TINCTURE PRP APPL 2/3 (GAUZE/BANDAGES/DRESSINGS) ×3 IMPLANT
CANISTER SUCT 1200ML W/VALVE (MISCELLANEOUS) ×3 IMPLANT
CANNULA DILATOR 10 W/SLV (CANNULA) ×3 IMPLANT
CATH REDDICK CHOLANGI 4FR 50CM (CATHETERS) ×3 IMPLANT
CHLORAPREP W/TINT 26ML (MISCELLANEOUS) ×3 IMPLANT
CLIP APPLIE ROT 10 11.4 M/L (STAPLE) ×2 IMPLANT
DRAPE SHEET LG 3/4 BI-LAMINATE (DRAPES) ×3 IMPLANT
ELECT REM PT RETURN 9FT ADLT (ELECTROSURGICAL) ×3
ELECTRODE REM PT RTRN 9FT ADLT (ELECTROSURGICAL) ×2 IMPLANT
GAUZE SPONGE 4X4 12PLY STRL (GAUZE/BANDAGES/DRESSINGS) ×3 IMPLANT
GLOVE BIO SURGEON STRL SZ7.5 (GLOVE) ×15 IMPLANT
GOWN STRL REUS W/ TWL LRG LVL3 (GOWN DISPOSABLE) ×6 IMPLANT
GOWN STRL REUS W/TWL LRG LVL3 (GOWN DISPOSABLE) ×3
IRRIGATION STRYKERFLOW (MISCELLANEOUS) ×2 IMPLANT
IRRIGATOR STRYKERFLOW (MISCELLANEOUS) ×3
IV NS 1000ML (IV SOLUTION) ×1
IV NS 1000ML BAXH (IV SOLUTION) ×2 IMPLANT
KIT RM TURNOVER STRD PROC AR (KITS) ×3 IMPLANT
LABEL OR SOLS (LABEL) ×3 IMPLANT
LOOP SUT CHROMIC 2-0 SGL1 (SUTURE) ×3 IMPLANT
NDL INSUFF ACCESS 14 VERSASTEP (NEEDLE) ×3 IMPLANT
NEEDLE FILTER BLUNT 18X 1/2SAF (NEEDLE) ×1
NEEDLE FILTER BLUNT 18X1 1/2 (NEEDLE) ×2 IMPLANT
NS IRRIG 500ML POUR BTL (IV SOLUTION) ×3 IMPLANT
PACK LAP CHOLECYSTECTOMY (MISCELLANEOUS) ×3 IMPLANT
SCISSORS METZENBAUM CVD 33 (INSTRUMENTS) ×3 IMPLANT
SEAL FOR SCOPE WARMER C3101 (MISCELLANEOUS) ×3 IMPLANT
SLEEVE ENDOPATH XCEL 5M (ENDOMECHANICALS) ×3 IMPLANT
STRIP CLOSURE SKIN 1/2X4 (GAUZE/BANDAGES/DRESSINGS) ×3 IMPLANT
SUT CHROMIC 5 0 RB 1 27 (SUTURE) ×3 IMPLANT
SUT VIC AB 0 CT2 27 (SUTURE) IMPLANT
SYR 3ML LL SCALE MARK (SYRINGE) ×3 IMPLANT
TROCAR XCEL NON-BLD 11X100MML (ENDOMECHANICALS) ×3 IMPLANT
TROCAR XCEL NON-BLD 5MMX100MML (ENDOMECHANICALS) ×3 IMPLANT
TUBING INSUFFLATOR HI FLOW (MISCELLANEOUS) ×3 IMPLANT
WATER STERILE IRR 1000ML POUR (IV SOLUTION) ×3 IMPLANT

## 2017-06-05 NOTE — Discharge Instructions (Signed)
Take Tylenol or Norco if needed for pain.  Should not drive or do anything dangerous when taking Norco.  Remove dressings on Wednesday. May shower Thursday and blot dry.  Avoid straining and heavy lifting during the first week after surgery.

## 2017-06-05 NOTE — Anesthesia Preprocedure Evaluation (Addendum)
Anesthesia Evaluation  Patient identified by MRN, date of birth, ID band Patient awake    Airway Mallampati: II  TM Distance: <3 FB     Dental   Pulmonary pneumonia, resolved, COPD,  COPD inhaler, former smoker,    Pulmonary exam normal        Cardiovascular + Past MI and + Peripheral Vascular Disease  Normal cardiovascular exam     Neuro/Psych PSYCHIATRIC DISORDERS Anxiety Depression    GI/Hepatic Neg liver ROS, GERD  ,  Endo/Other  negative endocrine ROS  Renal/GU negative Renal ROS  negative genitourinary   Musculoskeletal negative musculoskeletal ROS (+)   Abdominal Normal abdominal exam  (+)   Peds negative pediatric ROS (+)  Hematology negative hematology ROS (+)   Anesthesia Other Findings Past Medical History: No date: Anxiety No date: COPD (chronic obstructive pulmonary disease) (HCC) No date: GERD (gastroesophageal reflux disease) No date: Hypotension     Comment:  limiting med titration 2013: NSTEMI (non-ST elevated myocardial infarction) (HCC)     Comment:  12/2011 with normal coronaries possibly secondary to               Takotsubo (EF 30-35% by echo), occurred following two               episodes of acute respiratory distress 11/2016: Pneumonia     Comment:  history of 4/13: Takotsubo syndrome  Reproductive/Obstetrics                            Anesthesia Physical Anesthesia Plan  ASA: III  Anesthesia Plan: General   Post-op Pain Management:    Induction: Intravenous  PONV Risk Score and Plan:   Airway Management Planned: Oral ETT  Additional Equipment:   Intra-op Plan:   Post-operative Plan: Extubation in OR  Informed Consent: I have reviewed the patients History and Physical, chart, labs and discussed the procedure including the risks, benefits and alternatives for the proposed anesthesia with the patient or authorized representative who has indicated his/her  understanding and acceptance.   Dental advisory given  Plan Discussed with: CRNA and Surgeon  Anesthesia Plan Comments:         Anesthesia Quick Evaluation

## 2017-06-05 NOTE — Anesthesia Post-op Follow-up Note (Signed)
Anesthesia QCDR form completed.        

## 2017-06-05 NOTE — Transfer of Care (Signed)
Immediate Anesthesia Transfer of Care Note  Patient: Heidi [name]  Procedure(s) Performed: Procedure(s): LAPAROSCOPIC CHOLECYSTECTOMY (N/A)  Patient Location: PACU  Anesthesia Type:General  Level of Consciousness: sedated  Airway & Oxygen Therapy: Patient Spontanous Breathing and Patient connected to face mask oxygen  Post-op Assessment: Report given to RN and Post -op Vital signs reviewed and stable  Post vital signs: Reviewed and stable  Last Vitals:  Vitals:   06/05/17 1053 06/05/17 1303  BP: (!) 151/89 (!) 153/64  Pulse: 90 84  Resp: 16 (!) 23  Temp: (!) 36.1 C 36.8 C    Complications: No apparent anesthesia complications

## 2017-06-05 NOTE — Op Note (Addendum)
OPERATIVE REPORT  PREOPERATIVE DIAGNOSIS:  Chronic cholecystitis cholelithiasis  POSTOPERATIVE DIAGNOSIS: Chronic cholecystitis cholelithiasis  PROCEDURE: Laparoscopic cholecystectomy   ANESTHESIA: General  SURGEON: Renda RollsWilton Smith M.D.  INDICATIONS: She has history of epigastric pains and ultrasound findings of gallstones. Hepatobiliary scan demonstrated nonfunction of the gallbladder. Surgery was recommended for definitive treatment.    With the patient on the operating table in the supine position under general endotracheal anesthesia the abdomen was prepared with ChloraPrep solution and draped in a sterile manner. A short incision was made in the inferior aspect of the umbilicus and carried down to the deep fascia which was grasped with a laryngeal hook. A Veress needle was inserted aspirated and irrigated with a saline solution. The peritoneal cavity was insufflated with carbon dioxide. The Veress needle was removed. The 10 mm cannula was inserted. The 10 mm 0 laparoscope was inserted to view the peritoneal cavity.  Another incision was made in the epigastrium slightly to the right of the midline to introduce an 11 mm cannula. 2 incisions were made in the lateral aspect of the right upper quadrant to introduce 2   5 mm cannulas. Initial inspection revealed a smooth surface of the liver there were no adhesions seen. The gallbladder was retracted towards the right shoulder.  The gallbladder neck was retracted inferiorly and laterally.  The porta hepatis was identified. The gallbladder was mobilized with incision of the visceral peritoneum. The cystic duct was dissected free from surrounding structures. The cystic artery was dissected free from surrounding structures. A critical view of safety was demonstrated the cystic artery was controlled with endoclips and divided. An Endo Clip was placed across the cystic duct adjacent to the gallbladder neck. An incision was made in the cystic duct to  introduce a Reddick catheter. The Reddick catheter would not thread into the cystic duct. The cystic duct was manipulated to remove 3 stones from the cystic duct. The cholangiogram would still not thread into the cystic duct. Therefore a cholangiogram was not done.  The Reddick catheter was removed. The cystic duct was doubly ligated with endoclips and divided. The cystic duct was also ligated with a chromic Endoloop.  The gallbladder was dissected free from the liver with use of hook and cautery and blunt dissection. Bleeding was minimal and hemostasis was subsequently intact. The gallbladder was delivered up through the infraumbilical incision opened and suctioned.  The gallbladder was removed. There was some thickening of the gallbladder wall and 2 small palpable stones within the gallbladder. The gallbladder was submitted in formalin for routine pathology. The right upper quadrant was further inspected and could see hemostasis was intact. The cannulas were removed seeing no bleeding from the sites. Carbon dioxide was allowed to escape from the peritoneal cavity. The skin incisions were closed with interrupted 5-0 chromic subcutaneous suture benzoin and Steri-Strips. Gauze dressings were applied with paper tape.  The patient appeared to be in satisfactory condition and was prepared for transfer to the recovery room  Renda RollsWilton Smith M.D.

## 2017-06-05 NOTE — Anesthesia Procedure Notes (Signed)
Procedure Name: Intubation Date/Time: 06/05/2017 11:25 AM Performed by: Doreen Salvage Pre-anesthesia Checklist: Patient identified, Patient being monitored, Timeout performed, Emergency Drugs available and Suction available Patient Re-evaluated:Patient Re-evaluated prior to induction Oxygen Delivery Method: Circle system utilized Preoxygenation: Pre-oxygenation with 100% oxygen Induction Type: IV induction Ventilation: Mask ventilation without difficulty Laryngoscope Size: Mac and 3 Grade View: Grade I Tube type: Oral Tube size: 7.0 mm Number of attempts: 1 Airway Equipment and Method: Stylet Placement Confirmation: ETT inserted through vocal cords under direct vision,  positive ETCO2 and breath sounds checked- equal and bilateral Secured at: 21 cm Tube secured with: Tape Dental Injury: Teeth and Oropharynx as per pre-operative assessment

## 2017-06-05 NOTE — Progress Notes (Signed)
Dentures taken to post op for patient.

## 2017-06-05 NOTE — H&P (Signed)
  She comes today prepared for laparoscopic cholecystectomy. I reviewed her history of epigastric pains and ultrasound findings of gallstones and hepatobiliary scan findings of nonfunction of the gallbladder.  She reports no change in overall condition since the office visit.  Lab work reviewed  I discussed the plan for surgery.

## 2017-06-05 NOTE — Anesthesia Postprocedure Evaluation (Signed)
Anesthesia Post Note  Patient: Heidi [name]  Procedure(s) Performed: Procedure(s) (LRB): LAPAROSCOPIC CHOLECYSTECTOMY (N/A)  Patient location during evaluation: PACU Anesthesia Type: General Level of consciousness: awake and alert and oriented Pain management: pain level controlled Vital Signs Assessment: post-procedure vital signs reviewed and stable Respiratory status: spontaneous breathing Cardiovascular status: blood pressure returned to baseline Anesthetic complications: no     Last Vitals:  Vitals:   06/05/17 1347 06/05/17 1354  BP:    Pulse: 92 91  Resp: 16 16  Temp: 36.7 C     Last Pain:  Vitals:   06/05/17 1347  TempSrc: Temporal  PainSc: 9                  Hamna Asa

## 2017-06-07 LAB — SURGICAL PATHOLOGY

## 2017-06-15 ENCOUNTER — Ambulatory Visit: Payer: Medicare Other | Admitting: Cardiovascular Disease

## 2017-06-19 ENCOUNTER — Ambulatory Visit: Payer: Medicare Other | Admitting: Cardiovascular Disease

## 2017-08-06 ENCOUNTER — Encounter: Payer: Self-pay | Admitting: *Deleted

## 2017-08-06 ENCOUNTER — Emergency Department
Admission: EM | Admit: 2017-08-06 | Discharge: 2017-08-07 | Disposition: A | Discharge status: Home / Self Care | Payer: Medicare Other | Attending: Emergency Medicine | Admitting: Emergency Medicine

## 2017-08-06 ENCOUNTER — Emergency Department: Payer: Medicare Other

## 2017-08-06 DIAGNOSIS — I252 Old myocardial infarction: Secondary | ICD-10-CM | POA: Insufficient documentation

## 2017-08-06 DIAGNOSIS — Z79899 Other long term (current) drug therapy: Secondary | ICD-10-CM | POA: Diagnosis not present

## 2017-08-06 DIAGNOSIS — Z87891 Personal history of nicotine dependence: Secondary | ICD-10-CM | POA: Diagnosis not present

## 2017-08-06 DIAGNOSIS — J441 Chronic obstructive pulmonary disease with (acute) exacerbation: Secondary | ICD-10-CM | POA: Diagnosis not present

## 2017-08-06 DIAGNOSIS — R0602 Shortness of breath: Secondary | ICD-10-CM | POA: Diagnosis present

## 2017-08-06 LAB — CBC
HCT: 38.9 % (ref 35.0–47.0)
Hemoglobin: 13.5 g/dL (ref 12.0–16.0)
MCH: 32.4 pg (ref 26.0–34.0)
MCHC: 34.7 g/dL (ref 32.0–36.0)
MCV: 93.3 fL (ref 80.0–100.0)
PLATELETS: 316 10*3/uL (ref 150–440)
RBC: 4.17 MIL/uL (ref 3.80–5.20)
RDW: 14.2 % (ref 11.5–14.5)
WBC: 7.5 10*3/uL (ref 3.6–11.0)

## 2017-08-06 LAB — BASIC METABOLIC PANEL
Anion gap: 13 (ref 5–15)
BUN: 13 mg/dL (ref 6–20)
CALCIUM: 9.4 mg/dL (ref 8.9–10.3)
CO2: 24 mmol/L (ref 22–32)
CREATININE: 0.91 mg/dL (ref 0.44–1.00)
Chloride: 99 mmol/L — ABNORMAL LOW (ref 101–111)
GLUCOSE: 131 mg/dL — AB (ref 65–99)
Potassium: 4 mmol/L (ref 3.5–5.1)
Sodium: 136 mmol/L (ref 135–145)

## 2017-08-06 LAB — TROPONIN I

## 2017-08-06 MED ORDER — METHYLPREDNISOLONE SODIUM SUCC 125 MG IJ SOLR
80.0000 mg | Freq: Once | INTRAMUSCULAR | Status: AC
  Administered 2017-08-06: 80 mg via INTRAVENOUS
  Filled 2017-08-06: qty 2

## 2017-08-06 MED ORDER — HYDROCOD POLST-CPM POLST ER 10-8 MG/5ML PO SUER: 5.0000 mL | Freq: Two times a day (BID) | ORAL | 0 refills | Status: DC | PRN

## 2017-08-06 MED ORDER — PREDNISONE 10 MG PO TABS: 50.0000 mg | ORAL_TABLET | Freq: Every day | ORAL | 0 refills | Status: AC

## 2017-08-06 MED ORDER — MAGNESIUM SULFATE 2 GM/50ML IV SOLN
2.0000 g | Freq: Once | INTRAVENOUS | Status: AC
  Administered 2017-08-06: 2 g via INTRAVENOUS
  Filled 2017-08-06: qty 50

## 2017-08-06 MED ORDER — SODIUM CHLORIDE 0.9 % IV BOLUS (SEPSIS)
1000.0000 mL | Freq: Once | INTRAVENOUS | Status: AC
  Administered 2017-08-06: 1000 mL via INTRAVENOUS

## 2017-08-06 MED ORDER — IPRATROPIUM-ALBUTEROL 0.5-2.5 (3) MG/3ML IN SOLN
6.0000 mL | Freq: Once | RESPIRATORY_TRACT | Status: AC
  Administered 2017-08-06: 6 mL via RESPIRATORY_TRACT

## 2017-08-06 MED ORDER — IPRATROPIUM-ALBUTEROL 0.5-2.5 (3) MG/3ML IN SOLN
RESPIRATORY_TRACT | Status: AC
  Administered 2017-08-06: 6 mL via RESPIRATORY_TRACT
  Filled 2017-08-06: qty 6

## 2017-08-06 MED ORDER — AZITHROMYCIN 250 MG PO TABS: ORAL_TABLET | ORAL | 0 refills | Status: DC

## 2017-08-06 NOTE — ED Notes (Signed)
ED Provider at bedside. 

## 2017-08-06 NOTE — ED Triage Notes (Signed)
Pt to triage via wheelchair.  Pt has sob and chest tightness since last week.  Sx worse  Today.  Pt on 3 liters oxygen as needed at home   Pt has a cough.  No fever.  Pt alert.

## 2017-08-06 NOTE — ED Provider Notes (Signed)
[facility] Emergency Department Provider Note  ____________________________________________   First MD Initiated Contact with Patient 08/06/17 2253     (approximate)  I have reviewed the triage vital signs and the nursing notes.   HISTORY  Chief Complaint Shortness of Breath   HPI Heidi [name] is a 61 y.o. female self presents the emergency department with several days of moderate severity shortness of breath. She has a history of severe emphysema and has had a dry cough for the past 3 or 4 days. 2 days ago she went to an urgent care who gave her several breathing treatments and prescribed her Levaquin and 20 mg of prednisone daily. She feels like her symptoms have not improved. She uses 3 L of oxygen at home at night, however not during the day. the patient's symptoms began insidiously and has been slowly progressive. They're worse with exertion and somewhat improved with albuterol and rest.   Past Medical History:  Diagnosis Date  . Anxiety   . COPD (chronic obstructive pulmonary disease) (HCC)   . GERD (gastroesophageal reflux disease)   . Hypotension    limiting med titration  . NSTEMI (non-ST elevated myocardial infarction) [facility]) 2013   12/2011 with normal coronaries possibly secondary to Takotsubo (EF 30-35% by echo), occurred following two episodes of acute respiratory distress  . Pneumonia 11/2016   history of  . 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 Sepsis due to pneumonia (HCC) 12/02/2016  . COPD (chronic obstructive pulmonary disease) (HCC) 01/13/2016  . Near syncope 12/01/2015  . Aortic atherosclerosis (HCC) 12/01/2015  . CAP (community acquired pneumonia) 01/05/2015  . Ex-smoker 07/14/2014  . Allergic rhinitis 03/03/2014  . Anxiety 04/10/2013  . Hoarseness 10/10/2012  . Acute respiratory failure (HCC) 08/08/2012  . Acute encephalopathy 08/08/2012  . COPD with acute exacerbation (HCC)  08/08/2012  . Lactic acidosis 08/08/2012  . Chronic respiratory failure (HCC) 03/27/2012  . Takotsubo cardiomyopathy 01/31/2012  . Chest pain 01/23/2012  . TINGLING 05/26/2010  . SKIN LESION 11/08/2009  . ASCUS PAP 08/02/2009  . VAG HIGH RISK HUMAN PAPILLOMAVIRUS DNA TEST POS 08/02/2009  . DEPRESSION, CHRONIC with anxiety 06/22/2009  . MICROSCOPIC HEMATURIA 06/22/2009  . Smoking history 04/06/2009  . BRUISE 04/06/2009  . URI 10/09/2007  . HYSTERECTOMY, PARTIAL, HX OF 07/01/2007  . COPD exacerbation (HCC) 06/18/2007  . OSTEOPENIA 06/18/2007  . 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 ABDOMINAL HYSTERECTOMY    . CARDIAC CATHETERIZATION    . CHOLECYSTECTOMY N/A 06/05/2017   Procedure: LAPAROSCOPIC CHOLECYSTECTOMY;  Surgeon: Nadeen Landau, MD;  Location: [facility] ORS;  Service: General;  Laterality: N/A;  . FRACTURE SURGERY Left 07/2016   leg compound fraction  . LEFT HEART CATHETERIZATION WITH CORONARY ANGIOGRAM N/A 01/23/2012   Procedure: LEFT HEART CATHETERIZATION WITH CORONARY ANGIOGRAM;  Surgeon: Dolores Patty, MD;  Location: [place] CATH LAB;  Service: Cardiovascular;  Laterality: N/A;  . vagina rectal repair     after childbirth    Prior to Admission medications   Medication Sig Start Date End Date Taking? Authorizing Provider  acetaminophen (TYLENOL) 325 MG tablet Take 650 mg by mouth every 6 (six) hours as needed for mild pain or moderate pain.    [provider]  albuterol (PROVENTIL HFA;VENTOLIN HFA) 108 (90 Base) MCG/ACT inhaler Inhale 1-2 puffs into the lungs every 4 (four) hours as needed for wheezing or  shortness of breath.    [provider]  albuterol (PROVENTIL) (2.5 MG/3ML) 0.083% nebulizer solution Take 3 mLs (2.5 mg total) by nebulization every 6 (six) hours as needed for wheezing or shortness of breath. Patient taking differently: Take 2.5 mg by nebulization every 6 (six) hours as needed for  wheezing or shortness of breath (does 1 time day every day then more if needed).  03/01/17   Nita Sickle, MD  ALPRAZolam Prudy Feeler) 0.25 MG tablet Take 0.25 mg by mouth 2 (two) times daily.     [provider]  aluminum-magnesium hydroxide-simethicone (MAALOX) 200-200-20 MG/5ML SUSP Take 30 mLs by mouth 4 (four) times daily -  before meals and at bedtime. 02/21/17   Sharman Cheek, MD  azithromycin (ZITHROMAX Z-PAK) 250 MG tablet Take two tablets by mouth on day one then one tablet every day for days 2-5 08/06/17   Merrily Brittle, MD  budesonide-formoterol [facility]) 160-4.5 MCG/ACT inhaler Inhale 2 puffs into the lungs 2 (two) times daily. 03/02/17   Parrett, Virgel Bouquet, NP  busPIRone (BUSPAR) 7.5 MG tablet Take 7.5 mg by mouth 2 (two) times daily.    [provider]  chlorpheniramine-HYDROcodone (TUSSIONEX PENNKINETIC ER) 10-8 MG/5ML SUER Take 5 mLs by mouth at bedtime as needed for cough. Patient not taking: Reported on 08/06/2017 03/01/17   Nita Sickle, MD  chlorpheniramine-HYDROcodone [facility] ER) 10-8 MG/5ML SUER Take 5 mLs by mouth 2 (two) times daily as needed for cough. 08/06/17   Merrily Brittle, MD  COMBIVENT RESPIMAT 20-100 MCG/ACT AERS respimat INHALE ONE PUFF BY MOUTH EVERY 6 HOURS AS NEEDED FOR WHEEZING 05/03/17   Lupita Leash, MD  doxylamine, Sleep, (UNISOM) 25 MG tablet Take 50 mg by mouth at bedtime.    [provider]  escitalopram (LEXAPRO) 20 MG tablet Take 20 mg by mouth daily.     [provider]  famotidine (PEPCID) 20 MG tablet Take 1 tablet (20 mg total) by mouth 2 (two) times daily. Patient taking differently: Take 20 mg by mouth daily.  02/21/17   Sharman Cheek, MD  fluticasone (FLONASE) 50 MCG/ACT nasal spray USE TWO SPRAY(S) IN EACH NOSTRIL ONCE DAILY Patient taking differently: SPRAY 1 SPRAY EACH NOSTRIL ONCE DAILY IF NEEDED FOR NASAL CONGESTION 02/09/17   Lupita Leash, MD  guaiFENesin-dextromethorphan  (ROBITUSSIN DM) 100-10 MG/5ML syrup Take 5 mLs by mouth every 6 (six) hours as needed for cough. 12/04/16   Altamese Dilling, MD  HYDROcodone-acetaminophen (NORCO) 5-325 MG tablet Take 1-2 tablets by mouth every 4 (four) hours as needed for moderate pain. 06/05/17   Nadeen Landau, MD  Ipratropium-Albuterol (COMBIVENT RESPIMAT) 20-100 MCG/ACT AERS respimat Inhale 1 puff into the lungs every 6 (six) hours. Patient taking differently: Inhale 1 puff into the lungs every 6 (six) hours as needed for wheezing or shortness of breath.  05/11/17   Lupita Leash, MD  levofloxacin (LEVAQUIN) 750 MG tablet Take 750 mg by mouth daily.    [provider]  OXYGEN Inhale 2 L into the lungs as needed.    [provider]  pantoprazole (PROTONIX) 40 MG tablet Take 40 mg by mouth daily. Take 30-60 min before first meal of the day  01/06/13   Nyoka Cowden, MD  predniSONE (DELTASONE) 10 MG tablet Take 10 mg by mouth daily.    [provider]  predniSONE (DELTASONE) 10 MG tablet Take 5 tablets (50 mg total) by mouth daily. 08/06/17 08/10/17  Merrily Brittle, MD    Allergies  Codeine; Levaquin [levofloxacin in d5w]; Propranolol; and Zoloft [sertraline hcl]  Family History  Problem Relation Age of Onset  . Heart attack Mother        deceased from massive MI at age 19  . Liver disease Father        fatty liver; living, age 45  . Stroke Brother        at age 29, living   . Hypertension Brother        living, age 25    Social History Social History  Substance Use Topics  . Smoking status: Former Smoker    Packs/day: 1.00    Years: 30.00    Types: Cigarettes    Quit date: 01/21/2012  . Smokeless tobacco: Never Used  . Alcohol use Yes     Comment: 1-2 beers per week    Review of Systems Constitutional: No fever/chills Eyes: No visual changes. ENT: No sore throat. Cardiovascular: positive for chest pain. Respiratory: positive for shortness of  breath. Gastrointestinal: No abdominal pain.  No nausea, no vomiting.  No diarrhea.  No constipation. Genitourinary: Negative for dysuria. Musculoskeletal: Negative for back pain. Skin: Negative for rash. Neurological: Negative for headaches, focal weakness or numbness.   ____________________________________________   PHYSICAL EXAM:  VITAL SIGNS: ED Triage Vitals  Enc Vitals Group     BP 08/06/17 2225 (!) 145/98     Pulse Rate 08/06/17 2225 (!) 104     Resp 08/06/17 2225 (!) 26     Temp 08/06/17 2225 98.9 F (37.2 C)     Temp Source 08/06/17 2225 Oral     SpO2 08/06/17 2225 95 %     Weight 08/06/17 2223 121 lb (54.9 kg)     Height 08/06/17 2223  (1.676 m)     Head Circumference --      Peak Flow --      Pain Score 08/06/17 2222 6     Pain Loc --      Pain Edu? --      Excl. in GC? --     Constitutional: alert and oriented 4 appears somewhat short of breath but speaking in full sentences Eyes: PERRL EOMI. Head: Atraumatic. Nose: No congestion/rhinnorhea. Mouth/Throat: No trismus Neck: No stridor.   Cardiovascular: tachycardicrate, regular rhythm. Grossly normal heart sounds.  Good peripheral circulation. Respiratory: Normal respiratory effort.  No retractions. Lungs CTAB and moving good air Gastrointestinal: soft nontender Musculoskeletal: No lower extremity edema   Neurologic:  Normal speech and language.jittery after receiving albuterol Skin:  Skin is warm, dry and intact. No rash noted. Psychiatric: Mood and affect are normal. Speech and behavior are normal.    ____________________________________________   DIFFERENTIAL includes but not limited to  lobar collapse, pneumonia, pneumothorax, pulmonary embolism, COPD exacerbation ____________________________________________   LABS (all labs ordered are listed, but only abnormal results are displayed)  Labs Reviewed  BASIC METABOLIC PANEL - Abnormal; Notable for the following:       Result Value    Chloride 99 (*)    Glucose, Bld 131 (*)    All other components within normal limits  CBC  TROPONIN I    blood reviewed and interpreted by me as normal __________________________________________  EKG  ED ECG REPORT I, Merrily Brittle, the attending physician, personally viewed and interpreted this ECG.  Date: 08/06/2017 EKG Time:  Rate: 113 Rhythm: sinus tachycardia QRS Axis: normal Intervals: normal ST/T Wave abnormalities: normal Narrative Interpretation: no evidence of acute ischemia __________________________________________  RADIOLOGY  CXR  reviewed by me with no acute disease ____________________________________________   PROCEDURES  Procedure(s) performed: no  Procedures  Critical Care performed: no  Observation: no ____________________________________________   INITIAL IMPRESSION / ASSESSMENT AND PLAN / ED COURSE  Pertinent labs & imaging results that were available during my care of the patient were reviewed by me and considered in my medical decision making (see chart for details).  The patient arrives with a slightly elevated respiratory rate, however saturating well on room air. By the time I saw her she had artery had several breathing treatments and her lungs were completely clear. I had a discussion with the patient regarding the OPD flares and that she likely is dehydrated secondary to elevated respiratory rates we'll give her fluids, IV steroids, magnesium sulfate. 20 mg of prednisone is an inadequate dose to treat his COPD flares we will increase her to 50 mg a day.      __----------------------------------------- 12:41 AM on 08/07/2017 -----------------------------------------  The patient feels markedly improved. Her lungs remain clear. She is saturating well on room air and she would like to go home. She is discharged home with strict return precautions verbalizes understanding and agreement with the  plan.__________________________________________   FINAL CLINICAL IMPRESSION(S) / ED DIAGNOSES  Final diagnoses:  COPD exacerbation (HCC)      NEW MEDICATIONS STARTED DURING THIS VISIT:  New Prescriptions   AZITHROMYCIN (ZITHROMAX Z-PAK) 250 MG TABLET    Take two tablets by mouth on day one then one tablet every day for days 2-5   CHLORPHENIRAMINE-HYDROCODONE (TUSSIONEX PENNKINETIC ER) 10-8 MG/5ML SUER    Take 5 mLs by mouth 2 (two) times daily as needed for cough.   PREDNISONE (DELTASONE) 10 MG TABLET    Take 5 tablets (50 mg total) by mouth daily.     Note:  This document was prepared using Dragon voice recognition software and may include unintentional dictation errors.     Merrily Brittle, MD 08/07/17 [phone]

## 2017-08-06 NOTE — ED Notes (Signed)
Family at bedside. 

## 2017-08-07 NOTE — ED Notes (Signed)
Pt looks much improved. Pt is no longer struggling to breathe. Pt denies pain or SOB

## 2017-08-07 NOTE — Discharge Instructions (Signed)
Please take all of your antibiotics and steroids as prescribed and follow up with your primary care physician as needed. Return to the emergency department for any new or worsening symptoms such as fevers, chills, worsening shortness of breath, or for any other concerns whatsoever.  It was a pleasure to take care of you today, and thank you for coming to our emergency department.  If you have any questions or concerns before leaving please ask the nurse to grab me and I'm more than happy to go through your aftercare instructions again.  If you were prescribed any opioid pain medication today such as Norco, Vicodin, Percocet, morphine, hydrocodone, or oxycodone please make sure you do not drive when you are taking this medication as it can alter your ability to drive safely.  If you have any concerns once you are home that you are not improving or are in fact getting worse before you can make it to your follow-up appointment, please do not hesitate to call 911 and come back for further evaluation.  Merrily Brittle, MD  Results for orders placed or performed during the hospital encounter of 08/06/17  Basic metabolic panel  Result Value Ref Range   Sodium 136 135 - 145 mmol/L   Potassium 4.0 3.5 - 5.1 mmol/L   Chloride 99 (L) 101 - 111 mmol/L   CO2 24 22 - 32 mmol/L   Glucose, Bld 131 (H) 65 - 99 mg/dL   BUN 13 6 - 20 mg/dL   Creatinine, Ser 1.61 0.44 - 1.00 mg/dL   Calcium 9.4 8.9 - 09.6 mg/dL   GFR calc non Af Amer >60 >60 mL/min   GFR calc Af Amer >60 >60 mL/min   Anion gap 13 5 - 15  CBC  Result Value Ref Range   WBC 7.5 3.6 - 11.0 K/uL   RBC 4.17 3.80 - 5.20 MIL/uL   Hemoglobin 13.5 12.0 - 16.0 g/dL   HCT 04.5 40.9 - 81.1 %   MCV 93.3 80.0 - 100.0 fL   MCH 32.4 26.0 - 34.0 pg   MCHC 34.7 32.0 - 36.0 g/dL   RDW 91.4 78.2 - 95.6 %   Platelets 316 150 - 440 K/uL  Troponin I  Result Value Ref Range   Troponin I <0.03 <0.03 ng/mL   Dg Chest 2 View  Result Date:  08/06/2017 CLINICAL DATA:  Dyspnea and chest tightness since last week. EXAM: CHEST  2 VIEW COMPARISON:  03/17/2017 FINDINGS: Unchanged hyperinflation. Normal heart size. Normal pulmonary vasculature. Hilar and mediastinal contours are unremarkable and unchanged. The lungs are clear. No pleural effusion. IMPRESSION: Hyperinflation.  No consolidation or effusion. Electronically Signed   By: Ellery Plunk M.D.   On: 08/06/2017 22:55

## 2017-08-21 ENCOUNTER — Ambulatory Visit: Payer: Medicare Other | Admitting: Pulmonary Disease

## 2017-08-22 ENCOUNTER — Ambulatory Visit (INDEPENDENT_AMBULATORY_CARE_PROVIDER_SITE_OTHER): Payer: Medicare Other | Admitting: Pulmonary Disease

## 2017-08-22 ENCOUNTER — Encounter: Payer: Self-pay | Admitting: Pulmonary Disease

## 2017-08-22 ENCOUNTER — Other Ambulatory Visit: Payer: Self-pay | Admitting: Pulmonary Disease

## 2017-08-22 ENCOUNTER — Telehealth: Payer: Self-pay | Admitting: Pulmonary Disease

## 2017-08-22 VITALS — BP 138/76 | HR 96 | Ht 66.0 in | Wt 120.0 lb

## 2017-08-22 DIAGNOSIS — J441 Chronic obstructive pulmonary disease with (acute) exacerbation: Secondary | ICD-10-CM

## 2017-08-22 DIAGNOSIS — R0600 Dyspnea, unspecified: Secondary | ICD-10-CM

## 2017-08-22 MED ORDER — UMECLIDINIUM BROMIDE 62.5 MCG/INH IN AEPB: 1.0000 | INHALATION_SPRAY | Freq: Every day | RESPIRATORY_TRACT | 3 refills | Status: DC

## 2017-08-22 MED ORDER — UMECLIDINIUM BROMIDE 62.5 MCG/INH IN AEPB: 1.0000 | INHALATION_SPRAY | Freq: Every day | RESPIRATORY_TRACT | 0 refills | Status: DC

## 2017-08-22 NOTE — Telephone Encounter (Signed)
lmtcb X1 for pt. Pt was walked in office today and did not qualify for ambulatory oxygen, so I am unsure why she is needing smaller O2 tanks.

## 2017-08-22 NOTE — Progress Notes (Signed)
Subjective:    Patient ID: Heidi CondonCynthia T [name], female    DOB: 27-Feb-1956, 61 y.o.   MRN: [id number]  Synopsis: Heidi [name] first saw Heidi [name] with the [facility] in 2013. She has COPD. She smoked heavily up until October of 2013.  She had bad mouth sores from Spiriva.  HPI  Chief Complaint  Patient presents with  . Follow-up    pt c/o sob with exertion, fatigue.  [facility] for copd exacerb[facility].     Heidi BeechamCynthia says that since the last visit she had a flareup of COPD.  She initially went to urgent care and was treated with some prednisone and an antibiotic when she had a dry cough.  However, this did not improve her symptoms and in fact things worsened a bit so she needed to go to the emergency department for further evaluation.  There she had a chest x-ray and was treated for a COPD exacerbation with a different antibiotic and stronger doses of steroids.  Since then she says that she has improved though overall her shortness of breath is worse than it was prior to the episode.  Just minimal activities such as sweeping her floor makes her short of breath.  She is compliant with Symbicort.  She is not smoking cigarettes right now.   Past Medical History:  Diagnosis Date  . Anxiety   . COPD (chronic obstructive pulmonary disease) (HCC)   . GERD (gastroesophageal reflux disease)   . Hypotension    limiting med titration  . NSTEMI (non-ST elevated myocardial infarction) [facility](HCC) 2013   12/2011 with normal coronaries possibly secondary to Takotsubo (EF 30-35% by echo), occurred following two episodes of acute respiratory distress  . Pneumonia 11/2016   history of  . 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  HENT: Negative for congestion, postnasal drip, rhinorrhea and sinus pressure.   Respiratory: Positive for shortness of breath. Negative for cough and wheezing.   Cardiovascular:  Negative for chest pain, palpitations and leg swelling.       Objective:   Physical Exam Vitals:   08/22/17 1159  BP: 138/76  Pulse: 96  SpO2: 100%  Weight: 120 lb (54.4 kg)  Height: 5\' 6"  (1.676 m)  RA  Walk 200 feet today in the office and her O2 saturation remained normal.  Gen: chronically ill  appearing HENT: OP clear, TM's clear, neck supple PULM: Poor air movement B, normal percussion CV: RRR, no mgr, trace edema GI: BS+, soft, nontender Derm: no cyanosis or rash Psyche: normal mood and affect    PFT: 2013 FEV1 0.72 L (27% predicted) 2018 spiro: FEV 1 0.8L (29% pred)   Chest imaging: May 2018 CT chest images and apparently reviewed showing moderate to severe centrilobular emphysema bilaterally, there is a small lobulated nodule adjacent to the pleura in the right upper lobe, approximately 4mm in size October 2018 chest x-ray images independently reviewed showing emphysema but no infiltrate  ER records reviewed from August 06, 2017 where she was seen and treated for a COPD exacerbation.  CBC    Component Value Date/Time   WBC 7.5 08/06/2017 2242   RBC 4.17 08/06/2017 2242   HGB 13.5 08/06/2017 2242   HGB 12.4 11/29/2014 0439   HCT 38.9 08/06/2017 2242   HCT 37.6 11/29/2014 0439   PLT 316 08/06/2017 2242   PLT 293 11/29/2014 0439   MCV 93.3  08/06/2017 2242   MCV 99 11/29/2014 0439   MCH 32.4 08/06/2017 2242   MCHC 34.7 08/06/2017 2242   RDW 14.2 08/06/2017 2242   RDW 13.1 11/29/2014 0439   LYMPHSABS 1.2 03/17/2017 1205   LYMPHSABS 0.8 (L) 11/29/2014 0439   MONOABS 0.8 03/17/2017 1205   MONOABS 0.1 (L) 11/29/2014 0439   EOSABS 0.1 03/17/2017 1205   EOSABS 0.0 11/29/2014 0439   BASOSABS 0.0 03/17/2017 1205   BASOSABS 0.0 11/29/2014 0439       Assessment & Plan:   COPD with acute exacerbation (HCC)  Discussion: I am concerned about Heidi [name] because she had an exacerbation of her severe COPD recently.  She is quite fragile because of the severity  of her airflow obstruction.  I need to get her on an antimuscarinic to see if this will help break the cycle from exacerbations.  She said trouble with Spiriva causing mouth sores in the past so we will try Incruise.  I want her to come back and see [place] in about 4 weeks to make sure she is not having trouble with that.  If she has difficulty with Incruise we may need to consider a daily antibiotic or Reflumilast to prevent further exacerbations.  Plan: Severe COPD with exacerbation: We are going to add a medicine called Incruise, take this 1 puff daily no matter how you feel Continue Symbicort 2 puffs twice a day And exercise as much as possible We will see you back in about 4-6 weeks with a nurse practitioner to make sure you have not had a further exacerbation, if you have another episode then we may need to put you on a medicine called Roflumilast or start a daily antibiotic called azithromycin  We will see you back in 4-6 weeks  Updated Medication List Outpatient Encounter Prescriptions as of 08/22/2017  Medication Sig  . acetaminophen (TYLENOL) 325 MG tablet Take 650 mg by mouth every 6 (six) hours as needed for mild pain or moderate pain.  Marland Kitchen albuterol (PROVENTIL HFA;VENTOLIN HFA) 108 (90 Base) MCG/ACT inhaler Inhale 1-2 puffs into the lungs every 4 (four) hours as needed for wheezing or shortness of breath.  Marland Kitchen albuterol (PROVENTIL) (2.5 MG/3ML) 0.083% nebulizer solution Take 3 mLs (2.5 mg total) by nebulization every 6 (six) hours as needed for wheezing or shortness of breath. (Patient taking differently: Take 2.5 mg by nebulization every 6 (six) hours as needed for wheezing or shortness of breath (does 1 time day every day then more if needed). )  . ALPRAZolam (XANAX) 0.25 MG tablet Take 0.25 mg by mouth 2 (two) times daily.   . budesonide-formoterol (SYMBICORT) 160-4.5 MCG/ACT inhaler Inhale 2 puffs into the lungs 2 (two) times daily.  . busPIRone (BUSPAR) 7.5 MG tablet Take 7.5 mg by  mouth 2 (two) times daily.  . COMBIVENT RESPIMAT 20-100 MCG/ACT AERS respimat INHALE ONE PUFF BY MOUTH EVERY 6 HOURS AS NEEDED FOR WHEEZING  . doxylamine, Sleep, (UNISOM) 25 MG tablet Take 50 mg by mouth at bedtime.  Marland Kitchen escitalopram (LEXAPRO) 20 MG tablet Take 20 mg by mouth daily.   . famotidine (PEPCID) 20 MG tablet Take 1 tablet (20 mg total) by mouth 2 (two) times daily. (Patient taking differently: Take 20 mg by mouth daily. )  . fluticasone (FLONASE) 50 MCG/ACT nasal spray USE TWO SPRAY(S) IN EACH NOSTRIL ONCE DAILY (Patient taking differently: SPRAY 1 SPRAY EACH NOSTRIL ONCE DAILY IF NEEDED FOR NASAL CONGESTION)  . guaiFENesin-dextromethorphan (ROBITUSSIN DM) 100-10 MG/5ML syrup Take  5 mLs by mouth every 6 (six) hours as needed for cough.  . Ipratropium-Albuterol (COMBIVENT RESPIMAT) 20-100 MCG/ACT AERS respimat Inhale 1 puff into the lungs every 6 (six) hours. (Patient taking differently: Inhale 1 puff into the lungs every 6 (six) hours as needed for wheezing or shortness of breath. )  . OXYGEN Inhale 2 L into the lungs as needed.  . pantoprazole (PROTONIX) 40 MG tablet Take 40 mg by mouth daily. Take 30-60 min before first meal of the day   . [DISCONTINUED] chlorpheniramine-HYDROcodone (TUSSIONEX PENNKINETIC ER) 10-8 MG/5ML SUER Take 5 mLs by mouth 2 (two) times daily as needed for cough.  . [DISCONTINUED] HYDROcodone-acetaminophen (NORCO) 5-325 MG tablet Take 1-2 tablets by mouth every 4 (four) hours as needed for moderate pain.  . [DISCONTINUED] levofloxacin (LEVAQUIN) 750 MG tablet Take 750 mg by mouth daily.  . [DISCONTINUED] predniSONE (DELTASONE) 10 MG tablet Take 10 mg by mouth daily.  . [DISCONTINUED] aluminum-magnesium hydroxide-simethicone (MAALOX) 200-200-20 MG/5ML SUSP Take 30 mLs by mouth 4 (four) times daily -  before meals and at bedtime. (Patient not taking: Reported on 08/22/2017)  . [DISCONTINUED] azithromycin (ZITHROMAX Z-PAK) 250 MG tablet Take two tablets by mouth on day  one then one tablet every day for days 2-5 (Patient not taking: Reported on 08/22/2017)  . [DISCONTINUED] chlorpheniramine-HYDROcodone (TUSSIONEX PENNKINETIC ER) 10-8 MG/5ML SUER Take 5 mLs by mouth at bedtime as needed for cough. (Patient not taking: Reported on 08/22/2017)   No facility-administered encounter medications on file as of 08/22/2017.

## 2017-08-22 NOTE — Patient Instructions (Signed)
Severe COPD with exacerbation: We are going to add a medicine called Incruise, take this 1 puff daily no matter how you feel Continue Symbicort 2 puffs twice a day And exercise as much as possible We will see you back in about 4-6 weeks with a nurse practitioner to make sure you have not had a further exacerbation, if you have another episode then we may need to put you on a medicine called Roflumilast or start a daily antibiotic called azithromycin  We will see you back in 4-6 weeks

## 2017-08-23 NOTE — Telephone Encounter (Signed)
lmomtcb x 2 for the pt.  

## 2017-08-23 NOTE — Telephone Encounter (Signed)
Pt returned phone call; pt contact #  [phone]

## 2017-08-23 NOTE — Telephone Encounter (Signed)
Pt aware that during yesterday's OV she did not qualify for ambulatory oxygen, therefore we can not order small tanks.  Pt is aware and voiced her understanding. Nothing further needed.

## 2017-09-05 ENCOUNTER — Telehealth: Payer: Self-pay | Admitting: Pulmonary Disease

## 2017-09-05 MED ORDER — UMECLIDINIUM BROMIDE 62.5 MCG/INH IN AEPB: 1.0000 | INHALATION_SPRAY | Freq: Every day | RESPIRATORY_TRACT | 3 refills | Status: DC

## 2017-09-05 NOTE — Telephone Encounter (Signed)
Rx was sent to preferred pharm  I spoke with the pt and notified that this was done  Nothing further needed

## 2017-09-07 ENCOUNTER — Other Ambulatory Visit: Payer: Self-pay | Admitting: Pulmonary Disease

## 2017-10-01 ENCOUNTER — Encounter: Payer: Self-pay | Admitting: Pulmonary Disease

## 2017-10-01 ENCOUNTER — Ambulatory Visit (INDEPENDENT_AMBULATORY_CARE_PROVIDER_SITE_OTHER): Payer: Medicare Other | Admitting: Pulmonary Disease

## 2017-10-01 VITALS — BP 130/70 | HR 85 | Ht 66.0 in | Wt 121.2 lb

## 2017-10-01 DIAGNOSIS — J432 Centrilobular emphysema: Secondary | ICD-10-CM | POA: Diagnosis not present

## 2017-10-01 NOTE — Progress Notes (Signed)
Subjective:    Patient ID: Heidi [name], female    DOB: 1956/07/01, 61 y.o.   MRN: [id number]  Synopsis: Esmeralda LinksCynthia Lorino first saw Dr. Sherene SiresWert with the [facility] in 2013. She has COPD. She smoked heavily up until October of 2013.  She had bad mouth sores from Spiriva.  HPI  Chief Complaint  Patient presents with  . Follow-up    wheezing, shortness of breath   Her insurance won't cover the Incruise.  The insurance company suggested that she take another LAMA.   Her breathing has been about since the last visit.  No recent flare ups of her COPD.  Some wheezing no coughing.  No chest congestion, doesn't feel sick. She says that she has noticed that if she gets anxious she will feel more dyspnea.      Past Medical History:  Diagnosis Date  . Anxiety   . COPD (chronic obstructive pulmonary disease) (HCC)   . GERD (gastroesophageal reflux disease)   . Hypotension    limiting med titration  . NSTEMI (non-ST elevated myocardial infarction) [facility](HCC) 2013   12/2011 with normal coronaries possibly secondary to Takotsubo (EF 30-35% by echo), occurred following two episodes of acute respiratory distress  . Pneumonia 11/2016   history of  . 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  HENT: Negative for congestion, postnasal drip, rhinorrhea and sinus pressure.   Respiratory: Positive for shortness of breath. Negative for cough and wheezing.   Cardiovascular: Negative for chest pain, palpitations and leg swelling.       Objective:   Physical Exam Vitals:   10/01/17 1148  BP: 130/70  Pulse: 85  SpO2: 96%  Weight: 121 lb 4 oz (55 kg)  Height: 5\' 6"  (1.676 m)    RA  Gen: well appearing HENT: OP clear, TM's clear, neck supple PULM: CTA B, normal percussion CV: RRR, no mgr, trace edema GI: BS+, soft, nontender Derm: no cyanosis or rash Psyche: normal mood and affect     PFT: 2013 FEV1 0.72 L (27%  predicted) 2018 spiro: FEV 1 0.8L (29% pred)   Chest imaging: May 2018 CT chest images and apparently reviewed showing moderate to severe centrilobular emphysema bilaterally, there is a small lobulated nodule adjacent to the pleura in the right upper lobe, approximately 4mm in size October 2018 chest x-ray images independently reviewed showing emphysema but no infiltrate  ER records reviewed from August 06, 2017 where she was seen and treated for a COPD exacerbation.  CBC    Component Value Date/Time   WBC 7.5 08/06/2017 2242   RBC 4.17 08/06/2017 2242   HGB 13.5 08/06/2017 2242   HGB 12.4 11/29/2014 0439   HCT 38.9 08/06/2017 2242   HCT 37.6 11/29/2014 0439   PLT 316 08/06/2017 2242   PLT 293 11/29/2014 0439   MCV 93.3 08/06/2017 2242   MCV 99 11/29/2014 0439   MCH 32.4 08/06/2017 2242   MCHC 34.7 08/06/2017 2242   RDW 14.2 08/06/2017 2242   RDW 13.1 11/29/2014 0439   LYMPHSABS 1.2 03/17/2017 1205   LYMPHSABS 0.8 (L) 11/29/2014 0439   MONOABS 0.8 03/17/2017 1205   MONOABS 0.1 (L) 11/29/2014 0439   EOSABS 0.1 03/17/2017 1205   EOSABS 0.0 11/29/2014 0439   BASOSABS 0.0 03/17/2017 1205   BASOSABS 0.0 11/29/2014 0439       Assessment & Plan:   Centrilobular emphysema (HCC)  Discussion: [name] continues  to struggle with shortness of breath though I am pleased she has not had an exacerbation since the last visit.  Her insurance company would not cover Incruise.  I think she needs to have the addition of a long-acting muscarinic antagonist because of the severity of her symptoms.  We will give her a sample of Tudorza to try today.  COPD: Take Tudorza 1 puff twice a day Continue taking Symbicort 2 puffs twice a day Stay active Use albuterol as needed for chest tightness wheezing or shortness of breath  Follow-up in 4-6 weeks with a nurse practitioner to see how you are doing with the [place].  Updated Medication List Outpatient Encounter Medications as of 10/01/2017   Medication Sig  . acetaminophen (TYLENOL) 325 MG tablet Take 650 mg by mouth every 6 (six) hours as needed for mild pain or moderate pain.  Marland Kitchen. albuterol (PROVENTIL HFA;VENTOLIN HFA) 108 (90 Base) MCG/ACT inhaler Inhale 1-2 puffs into the lungs every 4 (four) hours as needed for wheezing or shortness of breath.  Marland Kitchen. albuterol (PROVENTIL) (2.5 MG/3ML) 0.083% nebulizer solution Take 3 mLs (2.5 mg total) by nebulization every 6 (six) hours as needed for wheezing or shortness of breath. (Patient taking differently: Take 2.5 mg by nebulization every 6 (six) hours as needed for wheezing or shortness of breath (does 1 time day every day then more if needed). )  . ALPRAZolam (XANAX) 0.25 MG tablet Take 0.25 mg by mouth 2 (two) times daily.   . budesonide-formoterol (SYMBICORT) 160-4.5 MCG/ACT inhaler Inhale 2 puffs into the lungs 2 (two) times daily.  . busPIRone (BUSPAR) 7.5 MG tablet Take 7.5 mg by mouth 2 (two) times daily.  . COMBIVENT RESPIMAT 20-100 MCG/ACT AERS respimat INHALE ONE PUFF BY MOUTH EVERY 6 HOURS AS NEEDED FOR WHEEZING  . doxylamine, Sleep, (UNISOM) 25 MG tablet Take 50 mg by mouth at bedtime.  Marland Kitchen. escitalopram (LEXAPRO) 20 MG tablet Take 20 mg by mouth daily.   . fluticasone (FLONASE) 50 MCG/ACT nasal spray USE 2 SPRAY(S) IN EACH NOSTRIL ONCE DAILY  . guaiFENesin-dextromethorphan (ROBITUSSIN DM) 100-10 MG/5ML syrup Take 5 mLs by mouth every 6 (six) hours as needed for cough.  . OXYGEN Inhale 2 L into the lungs as needed.  . pantoprazole (PROTONIX) 40 MG tablet Take 40 mg by mouth daily. Take 30-60 min before first meal of the day   . umeclidinium bromide (INCRUSE ELLIPTA) 62.5 MCG/INH AEPB Inhale 1 puff into the lungs daily.  . famotidine (PEPCID) 20 MG tablet Take 1 tablet (20 mg total) by mouth 2 (two) times daily. (Patient taking differently: Take 20 mg by mouth daily. )  . Ipratropium-Albuterol (COMBIVENT RESPIMAT) 20-100 MCG/ACT AERS respimat Inhale 1 puff into the lungs every 6 (six)  hours. (Patient taking differently: Inhale 1 puff into the lungs every 6 (six) hours as needed for wheezing or shortness of breath. )  . umeclidinium bromide (INCRUSE ELLIPTA) 62.5 MCG/INH AEPB Inhale 1 puff daily into the lungs.   No facility-administered encounter medications on file as of 10/01/2017.

## 2017-10-01 NOTE — Patient Instructions (Signed)
COPD: Take Tudorza 1 puff twice a day Continue taking Symbicort 2 puffs twice a day Stay active Use albuterol as needed for chest tightness wheezing or shortness of breath  Follow-up in 4-6 weeks with a nurse practitioner to see how you are doing with the [place].

## 2017-10-31 ENCOUNTER — Ambulatory Visit (INDEPENDENT_AMBULATORY_CARE_PROVIDER_SITE_OTHER): Payer: Medicare Other | Admitting: Adult Health

## 2017-10-31 ENCOUNTER — Other Ambulatory Visit: Payer: Self-pay

## 2017-10-31 ENCOUNTER — Encounter: Payer: Self-pay | Admitting: Adult Health

## 2017-10-31 DIAGNOSIS — J9611 Chronic respiratory failure with hypoxia: Secondary | ICD-10-CM

## 2017-10-31 DIAGNOSIS — J441 Chronic obstructive pulmonary disease with (acute) exacerbation: Secondary | ICD-10-CM | POA: Diagnosis not present

## 2017-10-31 MED ORDER — ACLIDINIUM BROMIDE 400 MCG/ACT IN AEPB: 1.0000 | INHALATION_SPRAY | Freq: Two times a day (BID) | RESPIRATORY_TRACT | 2 refills | Status: DC

## 2017-10-31 NOTE — Progress Notes (Signed)
@Patient  ID: Heidi [name], female    DOB: [birth year], 62 y.o.   MRN: [id number]  Chief Complaint  Patient presents with  . Follow-up    COPD    Referring provider: Leotis Shames, MD  HPI: 62 yo female former smoker with very severe COPD   TEST  8 2013 FEV1 was 0.72 L (27% predicted)   10/31/2017 Follow up : COPD  Patient presents for a one-month follow-up.  Last visit she was started on Tudorza . She remains on Symbicort twice daily.  She says that she feels that this has helped her breathing some she denies a flare of cough or wheezing.  She is trying to go to the gym at [facility].  She remains on oxygen 2 L at bedtime. She denies any chest pain orthopnea PND or hemoptysis    Allergies  Allergen Reactions  . Codeine Nausea Only  . Levaquin [Levofloxacin In D5w] Hives  . Propranolol Other (See Comments)    dizziness  . Zoloft [Sertraline Hcl] Other (See Comments)    Increased anxiety    Immunization History  Administered Date(s) Administered  . Influenza Split 07/14/2013, 07/07/2014, 08/02/2015, 09/08/2016, 07/25/2017  . Influenza Whole 09/07/2006, 09/02/2008, 08/02/2009, 08/22/2010, 08/30/2012  . Pneumococcal Conjugate-13 08/13/2015  . Pneumococcal Polysaccharide-23 07/02/2006, 01/25/2012  . Td 07/31/2005  . Tdap 07/31/2005, 05/17/2016, 12/29/2016    Past Medical History:  Diagnosis Date  . Anxiety   . COPD (chronic obstructive pulmonary disease) (HCC)   . GERD (gastroesophageal reflux disease)   . Hypotension    limiting med titration  . NSTEMI (non-ST elevated myocardial infarction) [facility]) 2013   12/2011 with normal coronaries possibly secondary to Takotsubo (EF 30-35% by echo), occurred following two episodes of acute respiratory distress  . Pneumonia 11/2016   history of  . Takotsubo syndrome 4/13    Tobacco History: Social History   Tobacco Use  Smoking Status Former Smoker  . Packs/day: 1.00  . Years: 30.00  . Pack years:  30.00  . Types: Cigarettes  . Last attempt to quit: 01/21/2012  . Years since quitting: 5.7  Smokeless Tobacco Never Used   Counseling given: Not Answered   Outpatient Encounter Medications as of 10/31/2017  Medication Sig  . acetaminophen (TYLENOL) 325 MG tablet Take 650 mg by mouth every 6 (six) hours as needed for mild pain or moderate pain.  Marland Kitchen albuterol (PROVENTIL HFA;VENTOLIN HFA) 108 (90 Base) MCG/ACT inhaler Inhale 1-2 puffs into the lungs every 4 (four) hours as needed for wheezing or shortness of breath.  Marland Kitchen albuterol (PROVENTIL) (2.5 MG/3ML) 0.083% nebulizer solution Take 3 mLs (2.5 mg total) by nebulization every 6 (six) hours as needed for wheezing or shortness of breath. (Patient taking differently: Take 2.5 mg by nebulization every 6 (six) hours as needed for wheezing or shortness of breath (does 1 time day every day then more if needed). )  . ALPRAZolam (XANAX) 0.25 MG tablet Take 0.25 mg by mouth 2 (two) times daily.   . budesonide-formoterol (SYMBICORT) 160-4.5 MCG/ACT inhaler Inhale 2 puffs into the lungs 2 (two) times daily.  . busPIRone (BUSPAR) 7.5 MG tablet Take 7.5 mg by mouth 2 (two) times daily.  . COMBIVENT RESPIMAT 20-100 MCG/ACT AERS respimat INHALE ONE PUFF BY MOUTH EVERY 6 HOURS AS NEEDED FOR WHEEZING  . doxylamine, Sleep, (UNISOM) 25 MG tablet Take 50 mg by mouth at bedtime.  Marland Kitchen escitalopram (LEXAPRO) 20 MG tablet Take 20 mg by mouth daily.   . fluticasone (  FLONASE) 50 MCG/ACT nasal spray USE 2 SPRAY(S) IN EACH NOSTRIL ONCE DAILY  . guaiFENesin-dextromethorphan (ROBITUSSIN DM) 100-10 MG/5ML syrup Take 5 mLs by mouth every 6 (six) hours as needed for cough.  . Ipratropium-Albuterol (COMBIVENT RESPIMAT) 20-100 MCG/ACT AERS respimat Inhale 1 puff into the lungs every 6 (six) hours. (Patient taking differently: Inhale 1 puff into the lungs every 6 (six) hours as needed for wheezing or shortness of breath. )  . OXYGEN Inhale 2 L into the lungs as needed.  . pantoprazole  (PROTONIX) 40 MG tablet Take 40 mg by mouth daily. Take 30-60 min before first meal of the day   . [DISCONTINUED] famotidine (PEPCID) 20 MG tablet Take 1 tablet (20 mg total) by mouth 2 (two) times daily. (Patient taking differently: Take 20 mg by mouth daily. )  . [DISCONTINUED] umeclidinium bromide (INCRUSE ELLIPTA) 62.5 MCG/INH AEPB Inhale 1 puff into the lungs daily.  . [DISCONTINUED] umeclidinium bromide (INCRUSE ELLIPTA) 62.5 MCG/INH AEPB Inhale 1 puff daily into the lungs.  . Aclidinium Bromide 400 MCG/ACT AEPB Inhale 1 puff into the lungs 2 (two) times daily.   No facility-administered encounter medications on file as of 10/31/2017.      Review of Systems  Constitutional:   No  weight loss, night sweats,  Fevers, chills, +fatigue, or  lassitude.  HEENT:   No headaches,  Difficulty swallowing,  Tooth/dental problems, or  Sore throat,                No sneezing, itching, ear ache, nasal congestion, post nasal drip,   CV:  No chest pain,  Orthopnea, PND, swelling in lower extremities, anasarca, dizziness, palpitations, syncope.   GI  No heartburn, indigestion, abdominal pain, nausea, vomiting, diarrhea, change in bowel habits, loss of appetite, bloody stools.   Resp:    No chest wall deformity  Skin: no rash or lesions.  GU: no dysuria, change in color of urine, no urgency or frequency.  No flank pain, no hematuria   MS:  No joint pain or swelling.  No decreased range of motion.  No back pain.    Physical Exam  BP 118/68   Pulse 88   Ht 5\' 6"  (1.676 m)   Wt 119 lb 2 oz (54 kg)   SpO2 94%   BMI 19.23 kg/m   GEN: A/Ox3; pleasant , NAD, elderly , frail    HEENT:  [place]/AT,  EACs-clear, TMs-wnl, NOSE-clear, THROAT-clear, no lesions, no postnasal drip or exudate noted.   NECK:  Supple w/ fair ROM; no JVD; normal carotid impulses w/o bruits; no thyromegaly or nodules palpated; no lymphadenopathy.    RESP decreased breath sounds in the bases , no  accessory muscle use, no  dullness to percussion  CARD:  RRR, no m/r/g, no peripheral edema, pulses intact, no cyanosis or clubbing.  GI:   Soft & nt; nml bowel sounds; no organomegaly or masses detected.   Musco: Warm bil, no deformities or joint swelling noted.   Neuro: alert, no focal deficits noted.    Skin: Warm, no lesions or rashes    Lab Results:  CBC   BMET   BNP Imaging: No results found.   Assessment & Plan:   COPD with acute exacerbation Improved control , tolerating Tudorza   Plan  Patient Instructions  Continue on Symbicort and Tudorza . Rinse after use.  Continue on Oxygen 2l/m At bedtime   Follow up with Dr. Kendrick Fries in 3 months and As needed   Please  contact office for sooner follow up if symptoms do not improve or worsen or seek emergency care        Chronic respiratory failure (HCC) Cont on O2 At bedtime .      Rubye Oaksammy Joellyn Grandt, NP 10/31/2017

## 2017-10-31 NOTE — Progress Notes (Signed)
Reviewed, agree 

## 2017-10-31 NOTE — Assessment & Plan Note (Signed)
Cont on O2 At bedtime   

## 2017-10-31 NOTE — Assessment & Plan Note (Signed)
Improved control , tolerating Tudorza   Plan  Patient Instructions  Continue on Symbicort and Tudorza . Rinse after use.  Continue on Oxygen 2l/m At bedtime   Follow up with [name] in 3 months and As needed   Please contact office for sooner follow up if symptoms do not improve or worsen or seek emergency care

## 2017-10-31 NOTE — Patient Instructions (Addendum)
Continue on Symbicort and Tudorza . Rinse after use.  Continue on Oxygen 2l/m At bedtime   Follow up with [name] in 3 months and As needed   Please contact office for sooner follow up if symptoms do not improve or worsen or seek emergency care

## 2017-11-14 NOTE — Telephone Encounter (Signed)
Encounter will be closed, as Aclidinium Bromide has been sent to preferred pharmacy. Nothing further is needed

## 2017-11-21 ENCOUNTER — Telehealth: Payer: Self-pay | Admitting: Pulmonary Disease

## 2017-11-21 NOTE — Telephone Encounter (Signed)
PA request received by [employer] for [place]. Initiated PA via [email or web address] Key: [id number]  Sent to plan- determination expected within 1-3 days.   Will hold for follow-up.

## 2017-11-22 ENCOUNTER — Ambulatory Visit: Payer: Medicare Other | Admitting: Pulmonary Disease

## 2017-11-27 NOTE — Telephone Encounter (Signed)
Per covermymeds PA was cancelled. Called covermymeds and they had me call patients insurance company. Per insurance company the patient does not need a PA for this medication at this time. Will close encounter, nothing further needed.

## 2017-12-04 ENCOUNTER — Telehealth: Payer: Self-pay | Admitting: Pulmonary Disease

## 2017-12-04 MED ORDER — FLUTICASONE-UMECLIDIN-VILANT 100-62.5-25 MCG/INH IN AEPB: 1.0000 | INHALATION_SPRAY | Freq: Every day | RESPIRATORY_TRACT | 0 refills | Status: DC

## 2017-12-04 NOTE — Telephone Encounter (Signed)
ATC pt, no answer. Left message for pt to call back.  

## 2017-12-04 NOTE — Telephone Encounter (Signed)
Spoke with pt, she stated she needed clearance to perform a colonoscopy so they can administer medication for her to be put to sleep during procedure. Please advise.   [facility] [phone]  Pt stated her insurance [employer]/Medicare will not cover Turdoza and will cover these alternatives. Stiolto, Anoro, Breo, BQ did you want to change to one of these? Please advise.

## 2017-12-04 NOTE — Telephone Encounter (Signed)
Called pt and advised message from the provider. Pt understood and verbalized understanding. Pt agreed to try Trelegy and I sent the medication to her pharmacy to see if they will cover it. She will get us an update on the coverage and email us through [employer]. Will await but advised to continue her inhalers until we have a final resolution.

## 2017-12-04 NOTE — Telephone Encounter (Signed)
Colonoscopy: I recommend this be done in the hospital because she is high risk  Her best bet may be to stop the Symbicort and start taking Trelegy.  We need to know if it is covered by her insurance.

## 2017-12-13 ENCOUNTER — Telehealth: Payer: Self-pay | Admitting: Pulmonary Disease

## 2017-12-13 MED ORDER — PREDNISONE 20 MG PO TABS: 20.0000 mg | ORAL_TABLET | Freq: Every day | ORAL | 0 refills | Status: DC

## 2017-12-13 MED ORDER — DOXYCYCLINE HYCLATE 100 MG PO TABS: 100.0000 mg | ORAL_TABLET | Freq: Two times a day (BID) | ORAL | 0 refills | Status: DC

## 2017-12-13 NOTE — Telephone Encounter (Signed)
Doxycycline 14519m po bid 5 days pred 20mg  daily 5 days

## 2017-12-13 NOTE — Telephone Encounter (Signed)
Called and spoke with pt letting her know we were sending 2 scripts to her pharmacy of an abx doxy and pred Rx.  Pt expressed understanding. Nothing further needed at this current time.

## 2017-12-13 NOTE — Telephone Encounter (Signed)
Spoke with pt, [facility] and had [facility] negative flu test. She was given tamiflu but she still has a cough with yellow/brown mucus and she cannot stop coughing. She is requesting an abx and cough medication. She denies fever and body aches but states she did have those symptoms on Monday. She is unable to sleep and fells very tired. BQ please advise if we can send in something for pt.   [facility]-

## 2017-12-17 ENCOUNTER — Emergency Department: Payer: Medicare Other

## 2017-12-17 ENCOUNTER — Encounter: Payer: Self-pay | Admitting: Emergency Medicine

## 2017-12-17 ENCOUNTER — Emergency Department
Admission: EM | Admit: 2017-12-17 | Discharge: 2017-12-17 | Disposition: A | Discharge status: Home / Self Care | Payer: Medicare Other | Attending: Emergency Medicine | Admitting: Emergency Medicine

## 2017-12-17 DIAGNOSIS — Z87891 Personal history of nicotine dependence: Secondary | ICD-10-CM | POA: Insufficient documentation

## 2017-12-17 DIAGNOSIS — R05 Cough: Secondary | ICD-10-CM | POA: Diagnosis present

## 2017-12-17 DIAGNOSIS — I252 Old myocardial infarction: Secondary | ICD-10-CM | POA: Diagnosis not present

## 2017-12-17 DIAGNOSIS — J441 Chronic obstructive pulmonary disease with (acute) exacerbation: Secondary | ICD-10-CM | POA: Insufficient documentation

## 2017-12-17 DIAGNOSIS — J069 Acute upper respiratory infection, unspecified: Secondary | ICD-10-CM | POA: Insufficient documentation

## 2017-12-17 DIAGNOSIS — Z79899 Other long term (current) drug therapy: Secondary | ICD-10-CM | POA: Diagnosis not present

## 2017-12-17 MED ORDER — IPRATROPIUM-ALBUTEROL 0.5-2.5 (3) MG/3ML IN SOLN
3.0000 mL | Freq: Once | RESPIRATORY_TRACT | Status: AC
  Administered 2017-12-17: 3 mL via RESPIRATORY_TRACT
  Filled 2017-12-17: qty 3

## 2017-12-17 MED ORDER — DEXAMETHASONE SODIUM PHOSPHATE 10 MG/ML IJ SOLN
10.0000 mg | Freq: Once | INTRAMUSCULAR | Status: AC
  Administered 2017-12-17: 10 mg via INTRAMUSCULAR
  Filled 2017-12-17: qty 1

## 2017-12-17 MED ORDER — HYDROCOD POLST-CPM POLST ER 10-8 MG/5ML PO SUER: 5.0000 mL | Freq: Every evening | ORAL | 0 refills | Status: DC | PRN

## 2017-12-17 NOTE — ED Triage Notes (Signed)
Pt with cough. Started on meds last week for flu and bronchitis. Pt still coughing.

## 2017-12-17 NOTE — ED Notes (Signed)
Pt verbalizes d/c understanding follow up and RX. PT in NAD, VS stable, pt ambualtory refuses wc

## 2017-12-17 NOTE — ED Notes (Signed)
FIRST NURSE NOTE:  Pt to ed with c/o sob,  Pt taken to triage 2 for EKG.  EKG done and shown to Dr. Mayford KnifeWilliams,  sats 97% at this time.

## 2017-12-17 NOTE — ED Provider Notes (Signed)
[facility] Emergency Department Provider Note   ____________________________________________    I have reviewed the triage vital signs and the nursing notes.   HISTORY  Chief Complaint Cough     HPI Heidi [name] is a 62 y.o. female with a history of COPD presents with complaints of cough, nonproductive.  Patient reports she called her pulmonologist last week and he started her on 20 mg of prednisone times 5 days as well as doxycycline.  She has been compliant with medications.  Has 1 antibiotic pill left.  Denies chest pain but complains primarily of significant cough which keeps her up at night.  No pleurisy.  No fevers or chills.  No significant shortness of breath.  No nausea vomiting or diaphoresis.   Past Medical History:  Diagnosis Date  . Anxiety   . COPD (chronic obstructive pulmonary disease) (HCC)   . GERD (gastroesophageal reflux disease)   . Hypotension    limiting med titration  . NSTEMI (non-ST elevated myocardial infarction) [facility]) 2013   12/2011 with normal coronaries possibly secondary to Takotsubo (EF 30-35% by echo), occurred following two episodes of acute respiratory distress  . Pneumonia 11/2016   history of  . 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 Sepsis due to pneumonia (HCC) 12/02/2016  . COPD (chronic obstructive pulmonary disease) (HCC) 01/13/2016  . Near syncope 12/01/2015  . Aortic atherosclerosis (HCC) 12/01/2015  . CAP (community acquired pneumonia) 01/05/2015  . Ex-smoker 07/14/2014  . Allergic rhinitis 03/03/2014  . Anxiety 04/10/2013  . Hoarseness 10/10/2012  . Acute respiratory failure (HCC) 08/08/2012  . Acute encephalopathy 08/08/2012  . COPD with acute exacerbation (HCC) 08/08/2012  . Lactic acidosis 08/08/2012  . Chronic respiratory failure (HCC) 03/27/2012  . Takotsubo cardiomyopathy 01/31/2012  . Chest pain 01/23/2012  . TINGLING 05/26/2010  . SKIN  LESION 11/08/2009  . ASCUS PAP 08/02/2009  . VAG HIGH RISK HUMAN PAPILLOMAVIRUS DNA TEST POS 08/02/2009  . DEPRESSION, CHRONIC with anxiety 06/22/2009  . MICROSCOPIC HEMATURIA 06/22/2009  . Smoking history 04/06/2009  . BRUISE 04/06/2009  . URI 10/09/2007  . HYSTERECTOMY, PARTIAL, HX OF 07/01/2007  . COPD exacerbation (HCC) 06/18/2007  . OSTEOPENIA 06/18/2007  . 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 ABDOMINAL HYSTERECTOMY    . CARDIAC CATHETERIZATION    . CHOLECYSTECTOMY N/A 06/05/2017   Procedure: LAPAROSCOPIC CHOLECYSTECTOMY;  Surgeon: Nadeen Landau, MD;  Location: [facility] ORS;  Service: General;  Laterality: N/A;  . FRACTURE SURGERY Left 07/2016   leg compound fraction  . LEFT HEART CATHETERIZATION WITH CORONARY ANGIOGRAM N/A 01/23/2012   Procedure: LEFT HEART CATHETERIZATION WITH CORONARY ANGIOGRAM;  Surgeon: Dolores Patty, MD;  Location: [facility] CATH LAB;  Service: Cardiovascular;  Laterality: N/A;  . vagina rectal repair     after childbirth    Prior to Admission medications   Medication Sig Start Date End Date Taking? Authorizing Provider  acetaminophen (TYLENOL) 325 MG tablet Take 650 mg by mouth every 6 (six) hours as needed for mild pain or moderate pain.    [provider]  Aclidinium Bromide 400 MCG/ACT AEPB Inhale 1 puff into the lungs 2 (two) times daily. 10/31/17   Parrett, Virgel Bouquet, NP  albuterol (PROVENTIL HFA;VENTOLIN HFA) 108 (90 Base) MCG/ACT inhaler Inhale 1-2 puffs into the lungs every 4 (four) hours as needed for wheezing or shortness of breath.  [provider]  albuterol (PROVENTIL) (2.5 MG/3ML) 0.083% nebulizer solution Take 3 mLs (2.5 mg total) by nebulization every 6 (six) hours as needed for wheezing or shortness of breath. Patient taking differently: Take 2.5 mg by nebulization every 6 (six) hours as needed for wheezing or shortness of breath (does 1 time day every day then more if  needed).  03/01/17   Nita Sickle, MD  ALPRAZolam Prudy Feeler) 0.25 MG tablet Take 0.25 mg by mouth 2 (two) times daily.     [provider]  budesonide-formoterol (SYMBICORT) 160-4.5 MCG/ACT inhaler Inhale 2 puffs into the lungs 2 (two) times daily. 03/02/17   Parrett, Virgel Bouquet, NP  busPIRone (BUSPAR) 7.5 MG tablet Take 7.5 mg by mouth 2 (two) times daily.    [provider]  chlorpheniramine-HYDROcodone (TUSSIONEX PENNKINETIC ER) 10-8 MG/5ML SUER Take 5 mLs by mouth at bedtime as needed for cough. 12/17/17   Jene Every, MD  COMBIVENT RESPIMAT 20-100 MCG/ACT AERS respimat INHALE ONE PUFF BY MOUTH EVERY 6 HOURS AS NEEDED FOR WHEEZING 05/03/17   Lupita Leash, MD  doxycycline (VIBRA-TABS) 100 MG tablet Take 1 tablet (100 mg total) by mouth 2 (two) times daily. 12/13/17   Lupita Leash, MD  doxylamine, Sleep, (UNISOM) 25 MG tablet Take 50 mg by mouth at bedtime.    [provider]  escitalopram (LEXAPRO) 20 MG tablet Take 20 mg by mouth daily.     [provider]  fluticasone (FLONASE) 50 MCG/ACT nasal spray USE 2 SPRAY(S) IN EACH NOSTRIL ONCE DAILY 09/07/17   Lupita Leash, MD  Fluticasone-Umeclidin-Vilant (TRELEGY ELLIPTA) 100-62.5-25 MCG/INH AEPB Inhale 1 puff into the lungs daily. 12/04/17   Lupita Leash, MD  guaiFENesin-dextromethorphan (ROBITUSSIN DM) 100-10 MG/5ML syrup Take 5 mLs by mouth every 6 (six) hours as needed for cough. 12/04/16   Altamese Dilling, MD  Ipratropium-Albuterol (COMBIVENT RESPIMAT) 20-100 MCG/ACT AERS respimat Inhale 1 puff into the lungs every 6 (six) hours. Patient taking differently: Inhale 1 puff into the lungs every 6 (six) hours as needed for wheezing or shortness of breath.  05/11/17   Lupita Leash, MD  OXYGEN Inhale 2 L into the lungs as needed.    [provider]  pantoprazole (PROTONIX) 40 MG tablet Take 40 mg by mouth daily. Take 30-60 min before first meal of the day  01/06/13   Nyoka Cowden,  MD  predniSONE (DELTASONE) 20 MG tablet Take 1 tablet (20 mg total) by mouth daily with breakfast. 12/13/17   Lupita Leash, MD     Allergies Codeine; Levaquin [levofloxacin in d5w]; Propranolol; and Zoloft [sertraline hcl]  Family History  Problem Relation Age of Onset  . Heart attack Mother        deceased from massive MI at age 34  . Liver disease Father        fatty liver; living, age 28  . Stroke Brother        at age 37, living   . Hypertension Brother        living, age 29    Social History Social History   Tobacco Use  . Smoking status: Former Smoker    Packs/day: 1.00    Years: 30.00    Pack years: 30.00    Types: Cigarettes    Last attempt to quit: 01/21/2012    Years since quitting: 5.9  . Smokeless tobacco: Never Used  Substance Use Topics  . Alcohol use: Yes    Comment: 1-2 beers  per week  . Drug use: No    Review of Systems  Constitutional: No fever/chills Eyes: No visual changes.  ENT: No sore throat. Cardiovascular: Denies chest pain. Respiratory: As above Gastrointestinal: No abdominal pain.   Genitourinary: Negative for dysuria. Musculoskeletal: Negative for back pain. Skin: Negative for rash. Neurological: Negative for headaches    ____________________________________________   PHYSICAL EXAM:  VITAL SIGNS: ED Triage Vitals  Enc Vitals Group     BP 12/17/17 1050 (!) 164/75     Pulse Rate 12/17/17 1050 100     Resp 12/17/17 1050 (!) 30     Temp 12/17/17 1050 98.7 F (37.1 C)     Temp Source 12/17/17 1050 Oral     SpO2 12/17/17 1050 100 %     Weight 12/17/17 1110 54 kg (119 lb)     Height --      Head Circumference --      Peak Flow --      Pain Score 12/17/17 1349 0     Pain Loc --      Pain Edu? --      Excl. in GC? --     Constitutional: Alert and oriented. No acute distress. Pleasant and interactive Eyes: Conjunctivae are normal.   Nose: No congestion/rhinnorhea. Mouth/Throat: Mucous membranes are moist.     Cardiovascular: Normal rate, regular rhythm. Grossly normal heart sounds.  Good peripheral circulation. Respiratory: Normal respiratory effort.  No retractions.  Scattered mild wheezes Gastrointestinal: Soft and nontender. No distention.  No CVA tenderness. Genitourinary: deferred Musculoskeletal:   Warm and well perfused Neurologic:  Normal speech and language. No gross focal neurologic deficits are appreciated.  Skin:  Skin is warm, dry and intact. No rash noted. Psychiatric: Mood and affect are normal. Speech and behavior are normal.  ____________________________________________   LABS (all labs ordered are listed, but only abnormal results are displayed)  Labs Reviewed - No data to display ____________________________________________  EKG  ED ECG REPORT I, Jene Everyobert Jakylah Bassinger, the attending physician, personally viewed and interpreted this ECG.  Date: 12/17/2017  Rhythm: normal sinus rhythm QRS Axis: normal Intervals: normal ST/T Wave abnormalities: normal Narrative Interpretation: no evidence of acute ischemia  ____________________________________________  RADIOLOGY  Chest x-ray unremarkable, no pneumonia ____________________________________________   PROCEDURES  Procedure(s) performed: No  Procedures   Critical Care performed: No ____________________________________________   INITIAL IMPRESSION / ASSESSMENT AND PLAN / ED COURSE  Pertinent labs & imaging results that were available during my care of the patient were reviewed by me and considered in my medical decision making (see chart for details).  Patient with a history of COPD presents with cough, suspect upper respiratory infection causing COPD exacerbation.  Treated with duo nebs here as well as Decadron IM.  Patient is requesting cough medication as well has had good results from Tussionex in the past, will prescribe this.  After nebulizers and Decadron patient reported feeling significant better.   She is to return if any worsening of her cough/breathing    ____________________________________________   FINAL CLINICAL IMPRESSION(S) / ED DIAGNOSES  Final diagnoses:  Viral upper respiratory tract infection  Chronic obstructive pulmonary disease with acute exacerbation (HCC)        Note:  This document was prepared using Dragon voice recognition software and may include unintentional dictation errors.    Jene EveryKinner, Rachal Dvorsky, MD 12/17/17 [phone]

## 2017-12-20 ENCOUNTER — Encounter: Payer: Self-pay | Admitting: Pulmonary Disease

## 2017-12-20 MED ORDER — ALBUTEROL SULFATE (2.5 MG/3ML) 0.083% IN NEBU: 2.5000 mg | INHALATION_SOLUTION | Freq: Four times a day (QID) | RESPIRATORY_TRACT | 11 refills | Status: DC | PRN

## 2017-12-21 ENCOUNTER — Other Ambulatory Visit: Payer: Self-pay | Admitting: Internal Medicine

## 2017-12-21 DIAGNOSIS — Z1239 Encounter for other screening for malignant neoplasm of breast: Secondary | ICD-10-CM

## 2018-01-22 ENCOUNTER — Other Ambulatory Visit: Payer: Self-pay | Admitting: Adult Health

## 2018-01-22 ENCOUNTER — Encounter: Payer: Self-pay | Admitting: Pulmonary Disease

## 2018-01-23 ENCOUNTER — Encounter: Payer: Self-pay | Admitting: Pulmonary Disease

## 2018-01-23 MED ORDER — BUDESONIDE-FORMOTEROL FUMARATE 160-4.5 MCG/ACT IN AERO: 2.0000 | INHALATION_SPRAY | Freq: Two times a day (BID) | RESPIRATORY_TRACT | 5 refills | Status: DC

## 2018-02-07 ENCOUNTER — Ambulatory Visit (INDEPENDENT_AMBULATORY_CARE_PROVIDER_SITE_OTHER): Payer: Medicare Other | Admitting: Pulmonary Disease

## 2018-02-07 ENCOUNTER — Encounter: Payer: Self-pay | Admitting: Pulmonary Disease

## 2018-02-07 VITALS — BP 146/88 | HR 88 | Ht 65.0 in | Wt 121.0 lb

## 2018-02-07 DIAGNOSIS — J9611 Chronic respiratory failure with hypoxia: Secondary | ICD-10-CM | POA: Diagnosis not present

## 2018-02-07 DIAGNOSIS — R911 Solitary pulmonary nodule: Secondary | ICD-10-CM | POA: Diagnosis not present

## 2018-02-07 DIAGNOSIS — J432 Centrilobular emphysema: Secondary | ICD-10-CM

## 2018-02-07 MED ORDER — FLUTICASONE-UMECLIDIN-VILANT 100-62.5-25 MCG/INH IN AEPB: 1.0000 | INHALATION_SPRAY | Freq: Every day | RESPIRATORY_TRACT | 5 refills | Status: DC

## 2018-02-07 MED ORDER — FLUTICASONE-UMECLIDIN-VILANT 100-62.5-25 MCG/INH IN AEPB: 1.0000 | INHALATION_SPRAY | Freq: Every day | RESPIRATORY_TRACT | 0 refills | Status: DC

## 2018-02-07 NOTE — Addendum Note (Signed)
Addended by: Sylvester HarderMOORE, Aldene Hendon R on: 02/07/2018 03:26 PM   Modules accepted: Orders

## 2018-02-07 NOTE — Progress Notes (Signed)
Subjective:    Patient ID: Heidi [name], female    DOB: [birth year], 62 y.o.   MRN: [id number]  Synopsis: Heidi [name] first saw Dr. Sherene Sires with the [facility] in 2013. She has COPD. She smoked heavily up until October of 2013.  She had bad mouth sores from Spiriva.  HPI  Chief Complaint  Patient presents with  . Follow-up    More SOB with activity, Wheezing    Heidi [name] says that she has been feeling more short of breath lately.  No recent congestion or mucus production.  She has not been able to take the Trelegy because she was told by her pharmacy that they would not fill it.  She has not had any chest tightness or swelling recently in her ankles.  About a week ago when she went to a concert she had to use oxygen in order to walk a long distance.   Past Medical History:  Diagnosis Date  . Anxiety   . COPD (chronic obstructive pulmonary disease) (HCC)   . GERD (gastroesophageal reflux disease)   . Hypotension    limiting med titration  . NSTEMI (non-ST elevated myocardial infarction) [facility]) 2013   12/2011 with normal coronaries possibly secondary to Takotsubo (EF 30-35% by echo), occurred following two episodes of acute respiratory distress  . Pneumonia 11/2016   history of  . 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  HENT: Negative for congestion, postnasal drip, rhinorrhea and sinus pressure.   Respiratory: Positive for shortness of breath. Negative for cough and wheezing.   Cardiovascular: Negative for chest pain, palpitations and leg swelling.       Objective:   Physical Exam Vitals:   02/07/18 1207  BP: (!) 146/88  Pulse: 88  SpO2: 96%  Weight: 121 lb (54.9 kg)  Height: 5\' 5"  (1.651 m)    RA  Gen: chronically ill appearing HENT: OP clear, TM's clear, neck supple PULM: Poor air movement no wheezing, normal percussion CV: RRR, no mgr, trace edema GI: BS+, soft, nontender Derm: no  cyanosis or rash Psyche: normal mood and affect     PFT: 2013 FEV1 0.72 L (27% predicted) 2018 spiro: FEV 1 0.8L (29% pred)   Chest imaging: May 2018 CT chest images and apparently reviewed showing moderate to severe centrilobular emphysema bilaterally, there is a small lobulated nodule adjacent to the pleura in the right upper lobe, approximately 4mm in size October 2018 chest x-ray images independently reviewed showing emphysema but no infiltrate  ER records reviewed from August 06, 2017 where she was seen and treated for a COPD exacerbation.  CBC    Component Value Date/Time   WBC 7.5 08/06/2017 2242   RBC 4.17 08/06/2017 2242   HGB 13.5 08/06/2017 2242   HGB 12.4 11/29/2014 0439   HCT 38.9 08/06/2017 2242   HCT 37.6 11/29/2014 0439   PLT 316 08/06/2017 2242   PLT 293 11/29/2014 0439   MCV 93.3 08/06/2017 2242   MCV 99 11/29/2014 0439   MCH 32.4 08/06/2017 2242   MCHC 34.7 08/06/2017 2242   RDW 14.2 08/06/2017 2242   RDW 13.1 11/29/2014 0439   LYMPHSABS 1.2 03/17/2017 1205   LYMPHSABS 0.8 (L) 11/29/2014 0439   MONOABS 0.8 03/17/2017 1205   MONOABS 0.1 (L) 11/29/2014 0439   EOSABS 0.1 03/17/2017 1205   EOSABS 0.0 11/29/2014 0439   BASOSABS 0.0 03/17/2017 1205   BASOSABS 0.0 11/29/2014 0439  Assessment & Plan:   Chronic respiratory failure with hypoxia (HCC)  Centrilobular emphysema (HCC) - Plan: Fluticasone-Umeclidin-Vilant (TRELEGY ELLIPTA) 100-62.5-25 MCG/INH AEPB  Pulmonary nodule  Discussion: Heidi [name] continues to struggle with severe COPD and recurrent exacerbations and worsening dyspnea.  This is due to severe COPD and is consistent with the natural history of COPD.  She does not have radiographic or serologic evidence of an underlying allergy driving her symptoms.  I think she needs to be using oxygen more frequently.  She is currently not using a long-acting muscarinic antagonist so we are going to find a way to get her a prescription for Trelegy.   I explained to her today that this is a replacement for Symbicort.  Plan: Chronic respiratory failure with hypoxemia: Qualifying walk today, if your oxygen qualifies we will prescribe a portable oxygen concentrator  Severe COPD with recurrent exacerbations: Take Trelegy 1 puff daily instead of Symbicort We will give you a written prescription of Trelegy to take to another pharmacy to fill the medicine Use albuterol as needed for chest tightness wheezing or shortness of breath  Pulmonary nodule seen on CT chest 02/2017, will repeat CT in 02/2018  Follow-up in 2-3 months or sooner if needed    Updated Medication List Outpatient Encounter Medications as of 02/07/2018  Medication Sig  . acetaminophen (TYLENOL) 325 MG tablet Take 650 mg by mouth every 6 (six) hours as needed for mild pain or moderate pain.  Marland Kitchen. albuterol (PROVENTIL HFA;VENTOLIN HFA) 108 (90 Base) MCG/ACT inhaler Inhale 1-2 puffs into the lungs every 4 (four) hours as needed for wheezing or shortness of breath.  Marland Kitchen. albuterol (PROVENTIL) (2.5 MG/3ML) 0.083% nebulizer solution Take 3 mLs (2.5 mg total) by nebulization every 6 (six) hours as needed for wheezing or shortness of breath.  . ALPRAZolam (XANAX) 0.25 MG tablet Take 0.25 mg by mouth 2 (two) times daily.   . budesonide-formoterol (SYMBICORT) 160-4.5 MCG/ACT inhaler Inhale 2 puffs into the lungs 2 (two) times daily.  . busPIRone (BUSPAR) 7.5 MG tablet Take 7.5 mg by mouth 2 (two) times daily.  . chlorpheniramine-HYDROcodone (TUSSIONEX PENNKINETIC ER) 10-8 MG/5ML SUER Take 5 mLs by mouth at bedtime as needed for cough.  . COMBIVENT RESPIMAT 20-100 MCG/ACT AERS respimat INHALE ONE PUFF BY MOUTH EVERY 6 HOURS AS NEEDED FOR WHEEZING  . doxylamine, Sleep, (UNISOM) 25 MG tablet Take 50 mg by mouth at bedtime.  Marland Kitchen. escitalopram (LEXAPRO) 20 MG tablet Take 20 mg by mouth daily.   . fluticasone (FLONASE) 50 MCG/ACT nasal spray USE 2 SPRAY(S) IN EACH NOSTRIL ONCE DAILY  .  Ipratropium-Albuterol (COMBIVENT RESPIMAT) 20-100 MCG/ACT AERS respimat Inhale 1 puff into the lungs every 6 (six) hours. (Patient taking differently: Inhale 1 puff into the lungs every 6 (six) hours as needed for wheezing or shortness of breath. )  . OXYGEN Inhale 2 L into the lungs as needed.  . pantoprazole (PROTONIX) 40 MG tablet Take 40 mg by mouth daily. Take 30-60 min before first meal of the day   . predniSONE (DELTASONE) 20 MG tablet Take 1 tablet (20 mg total) by mouth daily with breakfast.  . Fluticasone-Umeclidin-Vilant (TRELEGY ELLIPTA) 100-62.5-25 MCG/INH AEPB Inhale 1 puff into the lungs daily.  . [DISCONTINUED] Aclidinium Bromide 400 MCG/ACT AEPB Inhale 1 puff into the lungs 2 (two) times daily.  . [DISCONTINUED] doxycycline (VIBRA-TABS) 100 MG tablet Take 1 tablet (100 mg total) by mouth 2 (two) times daily.  . [DISCONTINUED] Fluticasone-Umeclidin-Vilant (TRELEGY ELLIPTA) 100-62.5-25 MCG/INH AEPB Inhale 1  puff into the lungs daily.  . [DISCONTINUED] guaiFENesin-dextromethorphan (ROBITUSSIN DM) 100-10 MG/5ML syrup Take 5 mLs by mouth every 6 (six) hours as needed for cough.   No facility-administered encounter medications on file as of 02/07/2018.

## 2018-02-07 NOTE — Patient Instructions (Signed)
Chronic respiratory failure with hypoxemia: Qualifying walk today, if your oxygen qualifies we will prescribe a portable oxygen concentrator  Severe COPD with recurrent exacerbations: Take Trelegy 1 puff daily instead of Symbicort We will give you a written prescription of Trelegy to take to another pharmacy to fill the medicine Use albuterol as needed for chest tightness wheezing or shortness of breath  Follow-up in 2-3 months or sooner if needed

## 2018-02-22 ENCOUNTER — Telehealth: Payer: Self-pay | Admitting: Pulmonary Disease

## 2018-02-22 NOTE — Telephone Encounter (Signed)
PA request received from [facility] CMM Key: [id number] PA request has been sent to plan, and a determination is expected within 72 HOURS.   [facility]

## 2018-02-26 NOTE — Telephone Encounter (Signed)
PA denied ([id number])  Appeal needed at this time, will route to DM and wait further instruction.

## 2018-02-26 NOTE — Telephone Encounter (Signed)
Dolly, [facility], calling to get an update on PA for Trelegy and see if theres another medication that can be prescribed for the pt. Cb is [phone].

## 2018-02-26 NOTE — Telephone Encounter (Signed)
We are awaiting Dr. Kendrick Fries response. Will advise pt

## 2018-02-26 NOTE — Telephone Encounter (Signed)
Per the denial letter, patient needs to have documentation that she has tried at least 2 of the following medications: Anoro, Breo, Serevent, or Stiolto.   Will route to BQ so he is aware.

## 2018-02-28 NOTE — Telephone Encounter (Signed)
Failed Stiolto (mouth sores) Failed Anoro (had exacerbations of COPD)

## 2018-02-28 NOTE — Telephone Encounter (Signed)
Will fax appeal to [facility] and will await decision. Will place back in BQ's cubby.

## 2018-03-15 NOTE — Telephone Encounter (Signed)
PA was approved as of 03/04/18. Nothing further is needed.

## 2018-03-27 ENCOUNTER — Other Ambulatory Visit: Payer: Self-pay

## 2018-03-27 MED ORDER — ALBUTEROL SULFATE (2.5 MG/3ML) 0.083% IN NEBU: 2.5000 mg | INHALATION_SOLUTION | Freq: Four times a day (QID) | RESPIRATORY_TRACT | 11 refills | Status: DC | PRN

## 2018-04-01 ENCOUNTER — Ambulatory Visit
Admission: RE | Admit: 2018-04-01 | Discharge: 2018-04-01 | Disposition: A | Discharge status: Home / Self Care | Payer: Medicare Other | Source: Ambulatory Visit | Attending: Pulmonary Disease | Admitting: Pulmonary Disease

## 2018-04-01 DIAGNOSIS — J439 Emphysema, unspecified: Secondary | ICD-10-CM | POA: Insufficient documentation

## 2018-04-01 DIAGNOSIS — I700 Atherosclerosis of aorta: Secondary | ICD-10-CM | POA: Insufficient documentation

## 2018-04-01 DIAGNOSIS — J984 Other disorders of lung: Secondary | ICD-10-CM | POA: Insufficient documentation

## 2018-04-01 DIAGNOSIS — R911 Solitary pulmonary nodule: Secondary | ICD-10-CM

## 2018-04-02 ENCOUNTER — Telehealth: Payer: Self-pay | Admitting: Pulmonary Disease

## 2018-04-02 NOTE — Telephone Encounter (Signed)
Patient returned call.  CT Chest results and recommendations given.  Patient stated understanding.  Nothing further needed at this time.

## 2018-04-02 NOTE — Telephone Encounter (Signed)
Called and left Patient message to call back.

## 2018-04-09 ENCOUNTER — Ambulatory Visit (INDEPENDENT_AMBULATORY_CARE_PROVIDER_SITE_OTHER): Payer: Medicare Other | Admitting: Pulmonary Disease

## 2018-04-09 ENCOUNTER — Encounter: Payer: Self-pay | Admitting: Pulmonary Disease

## 2018-04-09 VITALS — BP 124/82 | HR 98 | Ht 65.0 in | Wt 120.0 lb

## 2018-04-09 DIAGNOSIS — J432 Centrilobular emphysema: Secondary | ICD-10-CM | POA: Diagnosis not present

## 2018-04-09 DIAGNOSIS — J9611 Chronic respiratory failure with hypoxia: Secondary | ICD-10-CM

## 2018-04-09 IMAGING — US US ABDOMEN COMPLETE
1 series · 13 of 25 positions shown · non-contrast
Comparison: CT scan of March 02, 2013.

CLINICAL DATA: Right upper quadrant abdominal pain. Elevated liver
enzymes.

EXAM:
ABDOMEN ULTRASOUND COMPLETE

[Series 1: us abdomen complete · 0.17mm/px · 13 of 75 slices shown]
[im 1/75]
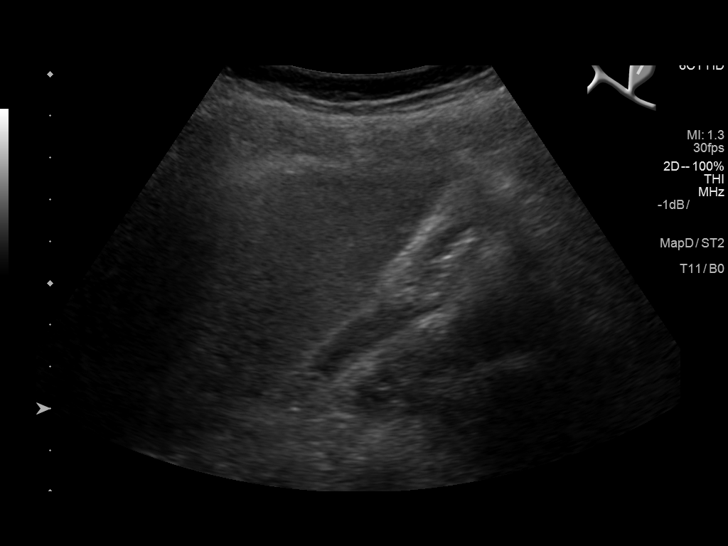
[im 7/75]
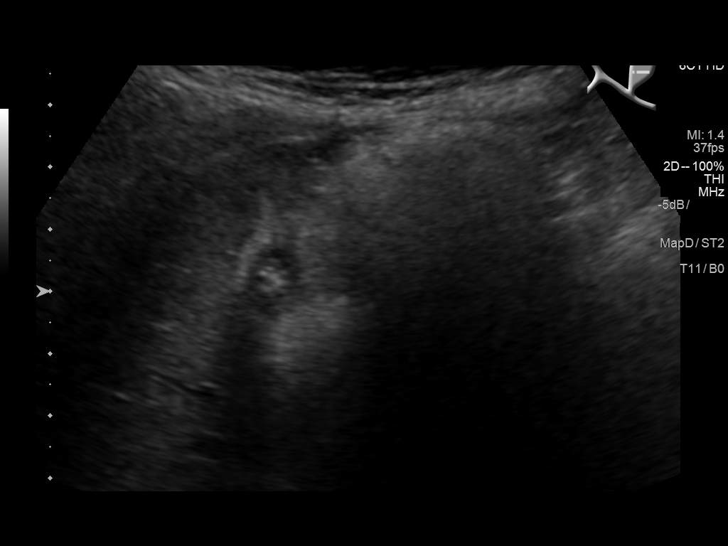
[im 13/75]
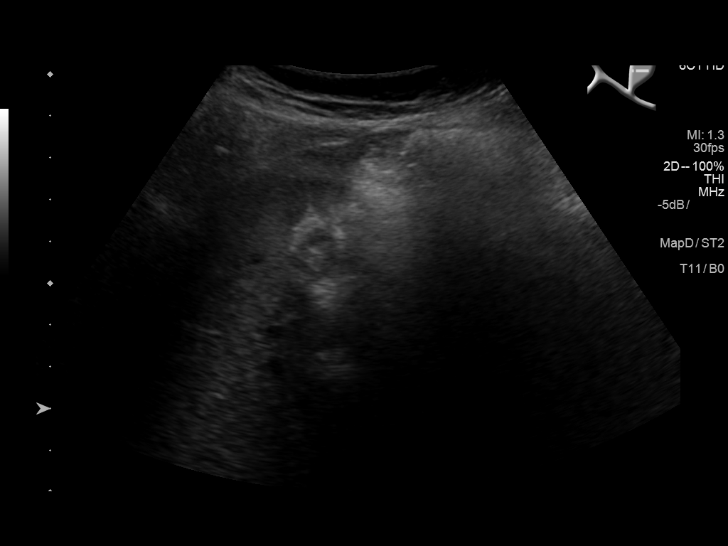
[im 19/75]
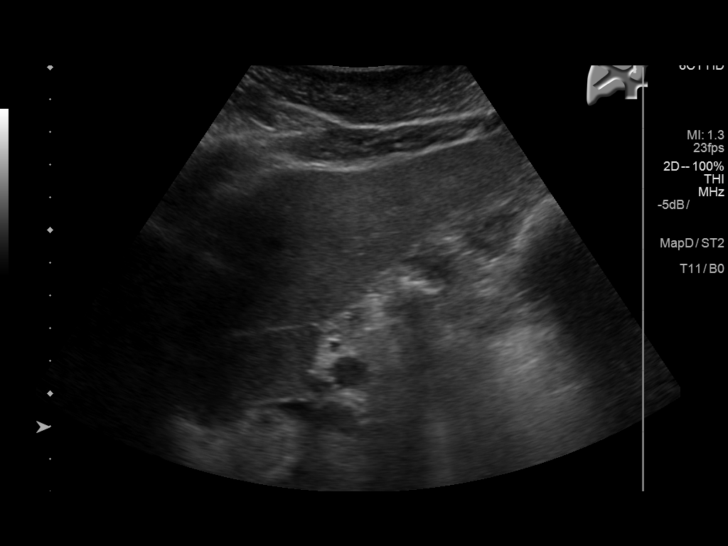
[im 25/75]
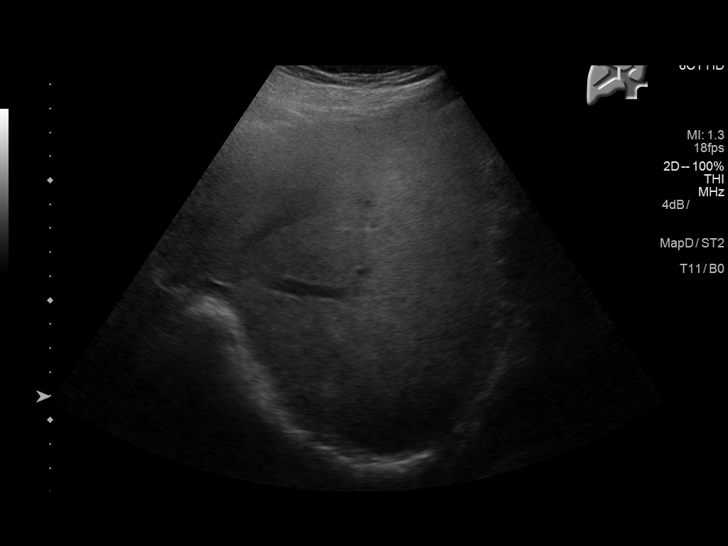
[im 31/75]
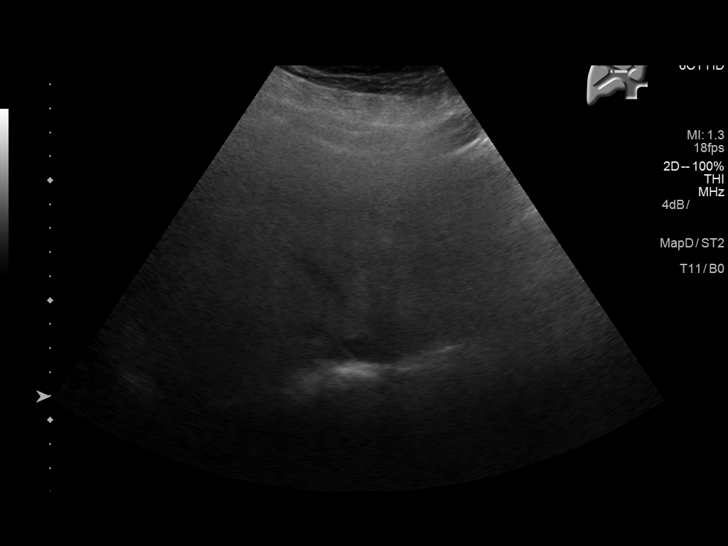
[im 38/75]
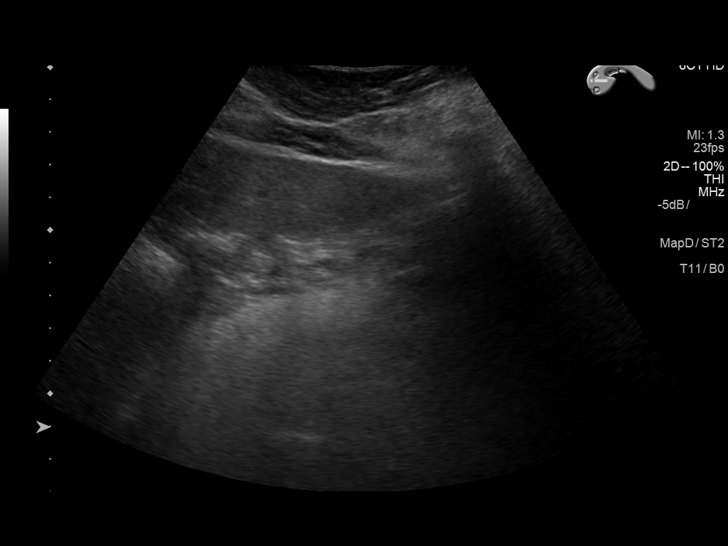
[im 44/75]
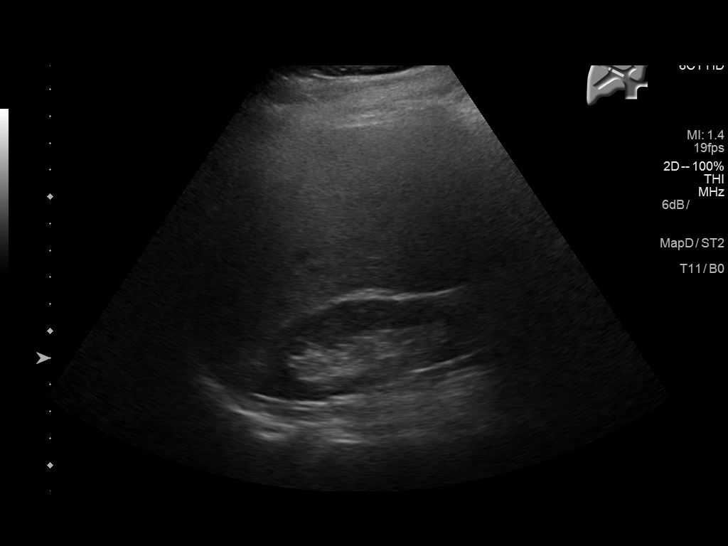
[im 50/75]
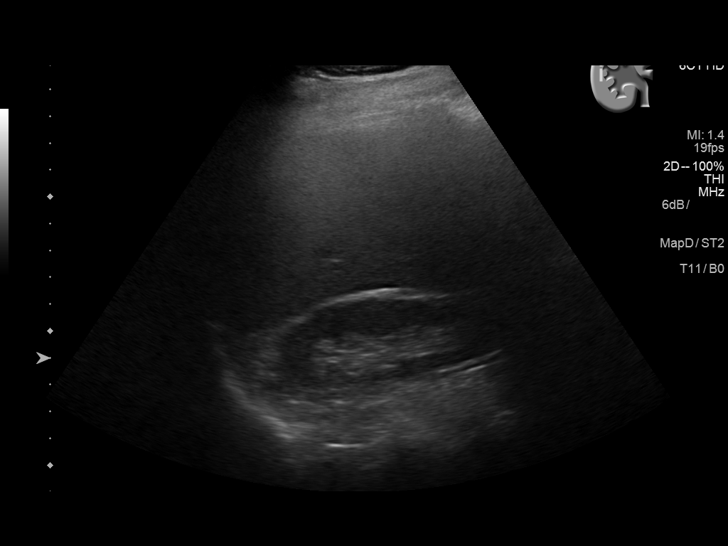
[im 56/75]
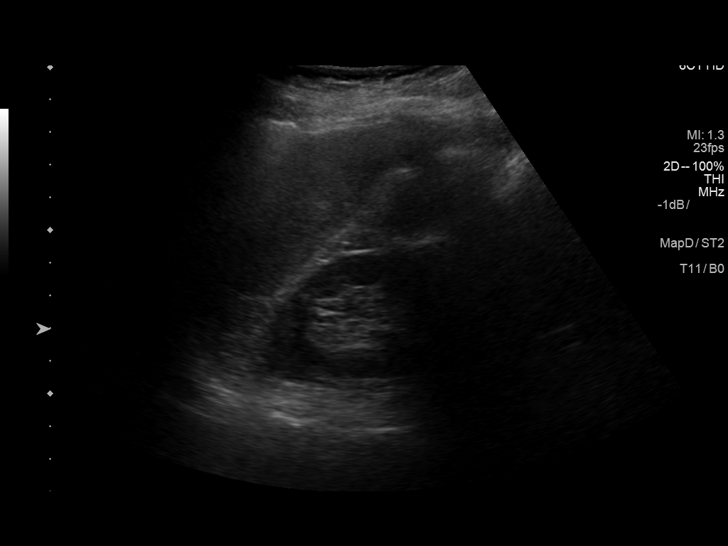
[im 62/75]
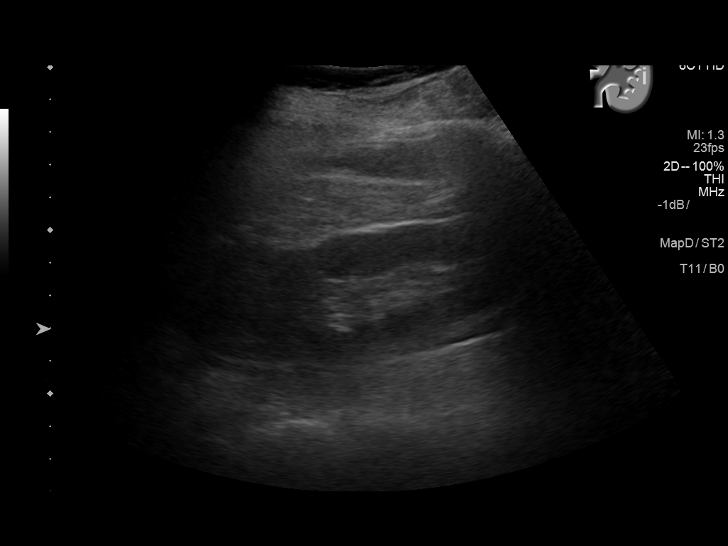
[im 68/75]
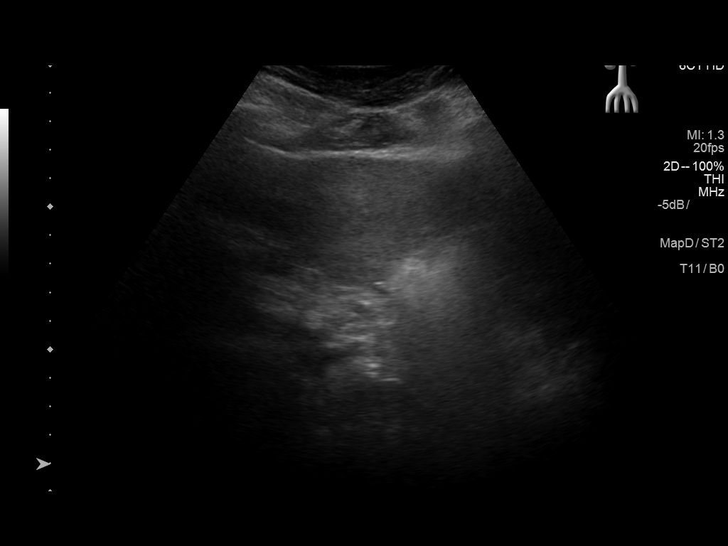
[im 75/75]
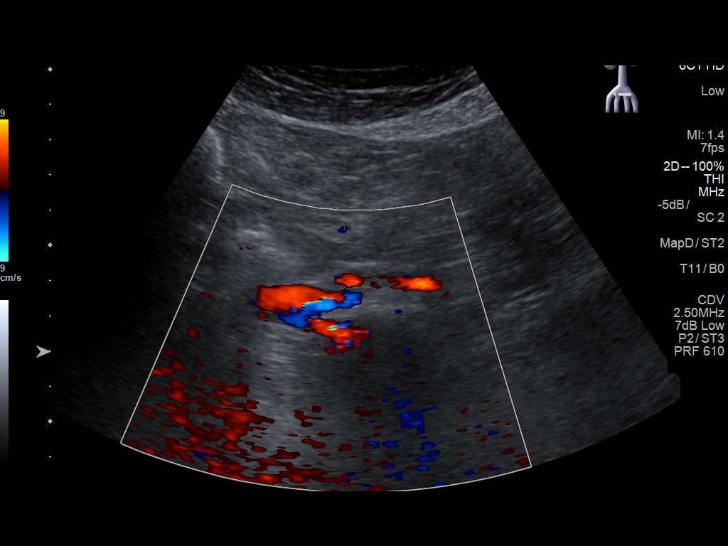

[13 of 25 positions shown; findings below may reference images not displayed]

FINDINGS: Gallbladder: Gallbladder is contracted, but cholelithiasis is noted
with largest stone measuring 5 mm. Wall is mildly thickened at 4 mm.
No sonographic Murphy's sign or pericholecystic fluid is noted.

Common bile duct: Diameter: 3 mm which is within normal limits.

Liver: No focal lesion identified. Increased echogenicity of hepatic
parenchyma is noted suggesting fatty infiltration or other diffuse
hepatocellular disease.

IVC: No abnormality visualized.

Pancreas: Not visualized due to overlying bowel gas.

Spleen: Size and appearance within normal limits.

Right Kidney: Length: 8.9 cm. Echogenicity within normal limits. No
mass or hydronephrosis visualized.

Left Kidney: Length: 9.8 cm. Echogenicity within normal limits. No
mass or hydronephrosis visualized.

Abdominal aorta: No aneurysm visualized.

Other findings: None.
IMPRESSION: Pancreas not visualized due to overlying bowel gas. Increased
echogenicity of hepatic parenchyma is noted suggesting fatty
infiltration or other diffuse hepatocellular disease.

Contracted gallbladder is noted. Cholelithiasis is noted with mild
gallbladder wall thickening, but no sonographic Murphy sign or
pericholecystic fluid is noted. HIDA scan may be performed to
evaluate for possible cholecystitis.

## 2018-04-09 NOTE — Progress Notes (Signed)
Subjective:    Patient ID: Heidi [name], female    DOB: [birth year], 62 y.o.   MRN: [id number]  Synopsis: Heidi [name] first saw Dr. Sherene Sires with the [facility] clinic in 2013. She has COPD. She smoked heavily up until October of 2013.  She had bad mouth sores from Spiriva.  HPI  Chief Complaint  Patient presents with  . Follow-up    ROV, chronic cough    Heidi [name] has been stable since the last visit.  Her father passed away and she has been handling his affairs.  She has been having to travel back and forth to his house and help move his stuff out.  She has been taking the Trelegy daily.  Her insurance approved it.  She has not been sick with bronchitis since the last visit.  Not coughing, not bringing up mucus.     Past Medical History:  Diagnosis Date  . Anxiety   . COPD (chronic obstructive pulmonary disease) (HCC)   . GERD (gastroesophageal reflux disease)   . Hypotension    limiting med titration  . NSTEMI (non-ST elevated myocardial infarction) [facility]) 2013   12/2011 with normal coronaries possibly secondary to Takotsubo (EF 30-35% by echo), occurred following two episodes of acute respiratory distress  . Pneumonia 11/2016   history of  . 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  HENT: Negative for congestion, postnasal drip, rhinorrhea and sinus pressure.   Respiratory: Positive for shortness of breath. Negative for cough and wheezing.   Cardiovascular: Negative for chest pain, palpitations and leg swelling.       Objective:   Physical Exam Vitals:   04/09/18 1159  BP: 124/82  Pulse: 98  SpO2: 94%  Weight: 120 lb (54.4 kg)  Height: 5\' 5"  (1.651 m)    RA  Gen: well appearing HENT: OP with thrush, neck supple PULM: Poor air movement no wheezing, normal percussion CV: RRR, no mgr, trace edema GI: BS+, soft, nontender Derm: no cyanosis or rash Psyche: normal mood and  affect    PFT: 2013 FEV1 0.72 L (27% predicted) 2018 spiro: FEV 1 0.8L (29% pred)   Chest imaging: May 2018 CT chest images and apparently reviewed showing moderate to severe centrilobular emphysema bilaterally, there is a small lobulated nodule adjacent to the pleura in the right upper lobe, approximately 4mm in size October 2018 chest x-ray images independently reviewed showing emphysema but no infiltrate June 2019 CT chest showed resolution of the nodules,  ER records reviewed from August 06, 2017 where she was seen and treated for a COPD exacerbation.  CBC    Component Value Date/Time   WBC 7.5 08/06/2017 2242   RBC 4.17 08/06/2017 2242   HGB 13.5 08/06/2017 2242   HGB 12.4 11/29/2014 0439   HCT 38.9 08/06/2017 2242   HCT 37.6 11/29/2014 0439   PLT 316 08/06/2017 2242   PLT 293 11/29/2014 0439   MCV 93.3 08/06/2017 2242   MCV 99 11/29/2014 0439   MCH 32.4 08/06/2017 2242   MCHC 34.7 08/06/2017 2242   RDW 14.2 08/06/2017 2242   RDW 13.1 11/29/2014 0439   LYMPHSABS 1.2 03/17/2017 1205   LYMPHSABS 0.8 (L) 11/29/2014 0439   MONOABS 0.8 03/17/2017 1205   MONOABS 0.1 (L) 11/29/2014 0439   EOSABS 0.1 03/17/2017 1205   EOSABS 0.0 11/29/2014 0439   BASOSABS 0.0 03/17/2017 1205   BASOSABS 0.0 11/29/2014 0439  Assessment & Plan:   Centrilobular emphysema (HCC)  Chronic respiratory failure with hypoxia (HCC)  Discussion: This has been a stable interval for her.  She does better in the warm weather months.  She has been compliant with Trelegy which unfortunately is giving her some thrush.  Advised that she rinse better today.  Her pulmonary nodules resolved.   plan: Severe COPD with recurrent exacerbations: Continue Trelegy 1 puff daily Rinse her mouth well after using Trelegy Use albuterol as needed for chest tightness wheezing or shortness of breath Stay active Get a high-dose flu shot in the fall   Thrush: Rinse your mouth regularly Let us know if this  doesn't improve and we can call in nystatin mouthwash  Follow up 6 months    Updated Medication List Outpatient Encounter Medications as of 04/09/2018  Medication Sig  . acetaminophen (TYLENOL) 325 MG tablet Take 650 mg by mouth every 6 (six) hours as needed for mild pain or moderate pain.  Marland Kitchen. albuterol (PROVENTIL HFA;VENTOLIN HFA) 108 (90 Base) MCG/ACT inhaler Inhale 1-2 puffs into the lungs every 4 (four) hours as needed for wheezing or shortness of breath.  Marland Kitchen. albuterol (PROVENTIL) (2.5 MG/3ML) 0.083% nebulizer solution Take 3 mLs (2.5 mg total) by nebulization every 6 (six) hours as needed for wheezing or shortness of breath.  . ALPRAZolam (XANAX) 0.25 MG tablet Take 0.25 mg by mouth 2 (two) times daily.   . busPIRone (BUSPAR) 7.5 MG tablet Take 7.5 mg by mouth 2 (two) times daily.  . COMBIVENT RESPIMAT 20-100 MCG/ACT AERS respimat INHALE ONE PUFF BY MOUTH EVERY 6 HOURS AS NEEDED FOR WHEEZING  . doxylamine, Sleep, (UNISOM) 25 MG tablet Take 50 mg by mouth at bedtime.  Marland Kitchen. escitalopram (LEXAPRO) 20 MG tablet Take 20 mg by mouth daily.   . fluticasone (FLONASE) 50 MCG/ACT nasal spray USE 2 SPRAY(S) IN EACH NOSTRIL ONCE DAILY  . Fluticasone-Umeclidin-Vilant (TRELEGY ELLIPTA) 100-62.5-25 MCG/INH AEPB Inhale 1 puff into the lungs daily.  . OXYGEN Inhale 2 L into the lungs as needed.  . pantoprazole (PROTONIX) 40 MG tablet Take 40 mg by mouth daily. Take 30-60 min before first meal of the day   . [DISCONTINUED] budesonide-formoterol (SYMBICORT) 160-4.5 MCG/ACT inhaler Inhale 2 puffs into the lungs 2 (two) times daily.  . [DISCONTINUED] Ipratropium-Albuterol (COMBIVENT RESPIMAT) 20-100 MCG/ACT AERS respimat Inhale 1 puff into the lungs every 6 (six) hours. (Patient taking differently: Inhale 1 puff into the lungs every 6 (six) hours as needed for wheezing or shortness of breath. )  . [DISCONTINUED] chlorpheniramine-HYDROcodone (TUSSIONEX PENNKINETIC ER) 10-8 MG/5ML SUER Take 5 mLs by mouth at bedtime  as needed for cough.  . [DISCONTINUED] Fluticasone-Umeclidin-Vilant (TRELEGY ELLIPTA) 100-62.5-25 MCG/INH AEPB Inhale 1 puff into the lungs daily.  . [DISCONTINUED] predniSONE (DELTASONE) 20 MG tablet Take 1 tablet (20 mg total) by mouth daily with breakfast.   No facility-administered encounter medications on file as of 04/09/2018.

## 2018-04-09 NOTE — Patient Instructions (Signed)
Severe COPD with recurrent exacerbations: Continue Trelegy 1 puff daily Rinse her mouth well after using Trelegy Use albuterol as needed for chest tightness wheezing or shortness of breath Stay active Get a high-dose flu shot in the fall   Thrush: Rinse your mouth regularly Let us know if this doesn't improve and we can call in nystatin mouthwash  Follow up 6 months

## 2018-04-21 ENCOUNTER — Other Ambulatory Visit: Payer: Self-pay | Admitting: Pulmonary Disease

## 2018-04-21 DIAGNOSIS — J432 Centrilobular emphysema: Secondary | ICD-10-CM

## 2018-04-22 MED ORDER — FLUTICASONE-UMECLIDIN-VILANT 100-62.5-25 MCG/INH IN AEPB: 1.0000 | INHALATION_SPRAY | Freq: Every day | RESPIRATORY_TRACT | 5 refills | Status: DC

## 2018-05-01 ENCOUNTER — Other Ambulatory Visit: Payer: Self-pay | Admitting: Pulmonary Disease

## 2018-05-14 ENCOUNTER — Encounter: Payer: Self-pay | Admitting: Pulmonary Disease

## 2018-05-14 MED ORDER — IPRATROPIUM-ALBUTEROL 20-100 MCG/ACT IN AERS: INHALATION_SPRAY | RESPIRATORY_TRACT | 5 refills | Status: DC

## 2018-06-30 ENCOUNTER — Other Ambulatory Visit: Payer: Self-pay | Admitting: Pulmonary Disease

## 2018-07-25 ENCOUNTER — Other Ambulatory Visit: Payer: Self-pay

## 2018-07-26 ENCOUNTER — Encounter: Payer: Self-pay | Admitting: Licensed Clinical Social Worker

## 2018-07-26 ENCOUNTER — Ambulatory Visit (INDEPENDENT_AMBULATORY_CARE_PROVIDER_SITE_OTHER): Payer: Medicare Other | Admitting: Licensed Clinical Social Worker

## 2018-07-26 DIAGNOSIS — F410 Panic disorder [episodic paroxysmal anxiety] without agoraphobia: Secondary | ICD-10-CM | POA: Diagnosis not present

## 2018-07-26 MED ORDER — FLUTICASONE PROPIONATE 50 MCG/ACT NA SUSP: 2.0000 | Freq: Every day | NASAL | 5 refills | Status: DC

## 2018-07-26 NOTE — Progress Notes (Signed)
Comprehensive Clinical Assessment (CCA) Note  07/26/2018 Heidi [name] [id number]  Visit Diagnosis:      ICD-10-CM   1. Panic disorder F41.0       CCA Part One  Part One has been completed on paper by the patient.  (See scanned document in Chart Review)  CCA Part Two A  Intake/Chief Complaint:  CCA Intake With Chief Complaint CCA Part Two Date: 07/26/18 CCA Part Two Time: 1000 Chief Complaint/Presenting Problem: "I've been having panic attacks and anxiety. It's come out of nowhere. I don't know why I start panicking, and my breathing changes, and then I snowball. I get into fight or flight."  Patients Currently Reported Symptoms/Problems: racing thoughts, panic attacks, breathing changes, heart races Collateral Involvement: N/A Individual's Strengths: "I'm easy going."  Individual's Preferences: N/A Individual's Abilities: Good communication  Type of Services Patient Feels Are Needed: medication management, individual therapy  Initial Clinical Notes/Concerns: Pt is visibly anxious.   Mental Health Symptoms Depression:  Depression: Change in energy/activity, Fatigue, Irritability, Sleep (too much or little), Weight gain/loss  Mania:  Mania: N/A  Anxiety:   Anxiety: Tension, Sleep, Worrying, Irritability, Fatigue, Restlessness(panic, breathing changes)  Psychosis:  Psychosis: N/A  Trauma:  Trauma: N/A  Obsessions:  Obsessions: N/A  Compulsions:  Compulsions: N/A  Inattention:  Inattention: N/A  Hyperactivity/Impulsivity:  Hyperactivity/Impulsivity: N/A  Oppositional/Defiant Behaviors:  Oppositional/Defiant Behaviors: N/A  Borderline Personality:  Emotional Irregularity: N/A  Other Mood/Personality Symptoms:  Other Mood/Personality Symtpoms: Pt reports panic attacks    Mental Status Exam Appearance and self-care  Stature:  Stature: Average  Weight:  Weight: Thin  Clothing:  Clothing: Casual  Grooming:  Grooming: Normal  Cosmetic use:  Cosmetic Use: Age appropriate   Posture/gait:  Posture/Gait: Normal  Motor activity:  Motor Activity: Not Remarkable  Sensorium  Attention:  Attention: Normal  Concentration:  Concentration: Normal  Orientation:  Orientation: X5  Recall/memory:  Recall/Memory: Normal  Affect and Mood  Affect:  Affect: Anxious  Mood:     Relating  Eye contact:  Eye Contact: Normal  Facial expression:  Facial Expression: Anxious  Attitude toward examiner:  Attitude Toward Examiner: Cooperative  Thought and Language  Speech flow: Speech Flow: Normal  Thought content:  Thought Content: Appropriate to mood and circumstances  Preoccupation:  Preoccupations: (N/A)  Hallucinations:  Hallucinations: (N/A)  Organization:     Company secretary of Knowledge:  Fund of Knowledge: Average  Intelligence:  Intelligence: Average  Abstraction:  Abstraction: Normal  Judgement:  Judgement: Normal  Reality Testing:  Reality Testing: Realistic  Insight:  Insight: Good  Decision Making:  Decision Making: Normal  Social Functioning  Social Maturity:  Social Maturity: Responsible  Social Judgement:  Social Judgement: Normal  Stress  Stressors:  Stressors: Grief/losses  Coping Ability:  Coping Ability: Normal  Skill Deficits:     Supports:      Family and Psychosocial History: Family history Marital status: Divorced Divorced, when?: 2003 What types of issues is patient dealing with in the relationship?: "We don't communicate."  Additional relationship information: Pt is in a long term relationship.  Are you sexually active?: Yes What is your sexual orientation?: Heterosexual  Has your sexual activity been affected by drugs, alcohol, medication, or emotional stress?: "I could care less about sex."  Does patient have children?: Yes How many children?: 2 How is patient's relationship with their children?: One daughter, one son--good relationship with both.   Childhood History:  Childhood History By whom was/is the patient raised?:  Both parents Additional childhood history information: Pt's parents divorced when pt was an adult.  Description of patient's relationship with caregiver when they were a child: "Great."  Patient's description of current relationship with people who raised him/her: Both parents have passed away. Pt's father passed away recently and pt's mother in 69.  How were you disciplined when you got in trouble as a child/adolescent?: "I didn't get in much trouble."  Does patient have siblings?: Yes Number of Siblings: 2 Description of patient's current relationship with siblings: pt reports having two brothers--"my younger brother is good, but he had a bad motorcylcle accident so I give him a break. I don't understand a lot of how my older brother acts."  Did patient suffer any verbal/emotional/physical/sexual abuse as a child?: No Did patient suffer from severe childhood neglect?: No Has patient ever been sexually abused/assaulted/raped as an adolescent or adult?: No Was the patient ever a victim of a crime or a disaster?: No Witnessed domestic violence?: No Has patient been effected by domestic violence as an adult?: No  CCA Part Two B  Employment/Work Situation: Employment / Work Situation Employment situation: On disability Why is patient on disability: Physical health, COPD How long has patient been on disability: 2013 Patient's job has been impacted by current illness: Yes Describe how patient's job has been impacted: Pt not able to work.  What is the longest time patient has a held a job?: 7 years Where was the patient employed at that time?: [employer]  Did You Receive Any Psychiatric Treatment/Services While in the Military?: (N/A) Are There Guns or Other Weapons in Your Home?: Yes Types of Guns/Weapons: shotgun  Are These Weapons Safely Secured?: Yes  Education: Education School Currently Attending: N/A Last Grade Completed: 12 Name of High School: [employer]  Did [place]  Graduate From [employer]?: Yes Did You Attend College?: No Did You Attend Graduate School?: No Did You Have Any Special Interests In School?: N/A Did You Have An Individualized Education Program (IIEP): No Did You Have Any Difficulty At School?: No  Religion: Religion/Spirituality Are You A Religious Person?: Yes What is Your Religious Affiliation?: Baptist How Might This Affect Treatment?: N/A  Leisure/Recreation: Leisure / Recreation Leisure and Hobbies: "I can't do a lot of the things I like to do because of my breathing. I like to read and play video games."   Exercise/Diet: Exercise/Diet Do You Exercise?: Yes What Type of Exercise Do You Do?: Other (Comment)("heart works." ) How Many Times a Week Do You Exercise?: 1-3 times a week Have You Gained or Lost A Significant Amount of Weight in the Past Six Months?: Yes-Gained Number of Pounds Gained: 15 Do You Follow a Special Diet?: No Do You Have Any Trouble Sleeping?: No  CCA Part Two C  Alcohol/Drug Use: Alcohol / Drug Use Pain Medications: SEE MAR Prescriptions: SEE MAr Over the Counter: SEE MAR History of alcohol / drug use?: No history of alcohol / drug abuse                      CCA Part Three  ASAM's:  Six Dimensions of Multidimensional Assessment  Dimension 1:  Acute Intoxication and/or Withdrawal Potential:     Dimension 2:  Biomedical Conditions and Complications:     Dimension 3:  Emotional, Behavioral, or Cognitive Conditions and Complications:     Dimension 4:  Readiness to Change:     Dimension 5:  Relapse, Continued use, or Continued Problem  Potential:     Dimension 6:  Recovery/Living Environment:      Substance use Disorder (SUD)    Social Function:  Social Functioning Social Maturity: Responsible Social Judgement: Normal  Stress:  Stress Stressors: Grief/losses Coping Ability: Normal Patient Takes Medications The Way The Doctor Instructed?: Yes Priority Risk: Low Acuity  Risk  Assessment- Self-Harm Potential: Risk Assessment For Self-Harm Potential Thoughts of Self-Harm: No current thoughts Method: No plan Availability of Means: No access/NA Additional Information for Self-Harm Potential: (N/A) Additional Comments for Self-Harm Potential: N/A  Risk Assessment -Dangerous to Others Potential: Risk Assessment For Dangerous to Others Potential Method: No Plan Availability of Means: No access or NA Intent: Vague intent or NA Notification Required: No need or identified person Additional Information for Danger to Others Potential: (N/A) Additional Comments for Danger to Others Potential: N/A  DSM5 Diagnoses: Patient Active Problem List   Diagnosis Date Noted  . Sepsis due to pneumonia (HCC) 12/02/2016  . COPD (chronic obstructive pulmonary disease) (HCC) 01/13/2016  . Near syncope 12/01/2015  . Aortic atherosclerosis (HCC) 12/01/2015  . CAP (community acquired pneumonia) 01/05/2015  . Ex-smoker 07/14/2014  . Allergic rhinitis 03/03/2014  . Anxiety 04/10/2013  . Hoarseness 10/10/2012  . Acute respiratory failure (HCC) 08/08/2012  . Acute encephalopathy 08/08/2012  . COPD with acute exacerbation (HCC) 08/08/2012  . Lactic acidosis 08/08/2012  . Chronic respiratory failure (HCC) 03/27/2012  . Takotsubo cardiomyopathy 01/31/2012  . Chest pain 01/23/2012  . TINGLING 05/26/2010  . SKIN LESION 11/08/2009  . ASCUS PAP 08/02/2009  . VAG HIGH RISK HUMAN PAPILLOMAVIRUS DNA TEST POS 08/02/2009  . DEPRESSION, CHRONIC with anxiety 06/22/2009  . MICROSCOPIC HEMATURIA 06/22/2009  . Smoking history 04/06/2009  . BRUISE 04/06/2009  . URI 10/09/2007  . HYSTERECTOMY, PARTIAL, HX OF 07/01/2007  . COPD exacerbation (HCC) 06/18/2007  . OSTEOPENIA 06/18/2007  . 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 She reports several recent psychosocial stressors, including the loss of her father and a subsequent conflict with her oldest brother. I provided Idell with a thought record and asked her to complete the first three columns over the next week, so we could discuss it at our next session.     Referrals to Alternative Service(s): Referred to Alternative Service(s):   Place:   Date:   Time:    Referred to Alternative Service(s):   Place:   Date:   Time:    Referred to Alternative Service(s):   Place:   Date:   Time:    Referred to Alternative Service(s):   Place:   Date:   Time:     Heidi Dach, LCSW

## 2018-07-31 ENCOUNTER — Ambulatory Visit: Payer: Medicare Other | Admitting: Psychiatry

## 2018-08-06 ENCOUNTER — Encounter: Payer: Self-pay | Admitting: Licensed Clinical Social Worker

## 2018-08-06 ENCOUNTER — Ambulatory Visit (INDEPENDENT_AMBULATORY_CARE_PROVIDER_SITE_OTHER): Payer: Medicare Other | Admitting: Licensed Clinical Social Worker

## 2018-08-06 DIAGNOSIS — F410 Panic disorder [episodic paroxysmal anxiety] without agoraphobia: Secondary | ICD-10-CM

## 2018-08-06 NOTE — Progress Notes (Signed)
   THERAPIST PROGRESS NOTE  Session Time: 1100-1130  Participation Level: Active  Behavioral Response: NeatAlertAnxious  Type of Therapy: Individual Therapy  Treatment Goals addressed: Anxiety  Interventions: Supportive  Summary: Heidi [name] is a 62 y.o. female who presents with anxiety symptoms. [name] reports she has not had a panic attack in three weeks, and has been able to challenge her automatic thoughts when she feels herself starting to panic. She stated, "I kind of talk myself out of it." Last session, I provided [name] with a thought record but she reports she did not complete it as she had "too much going on." She reports she is managing her anxiety well except with a situation with her son. She reports he is currently in jail in [place], and calls constantly to ask her for money. She reports feeling angry and resentful as he never checks in with her, and "the situations he's in never seem to be his fault according to him." She reported wanting to set a boundary with him, and stated feeling anxious about that, "I just don't want to be a bad mother." We discussed why she felt that setting a boundary would be negative, and how her son may react. We discussed ways that she demonstrates positive parenting, and how at some point her son has to take responsibility for himself. I assisted [name] in creating a plan to assertively set a boundary with her son. She plans to answer his next call, inform him she will not give him any money, and then if he becomes verbally abusive, she will hang up and block the number.   Suicidal/Homicidal: No  Therapist Response: [name] reports improvements in her anxiety and panic symptoms. She reports being able to challenge automatic thoughts and being able to keep herself calm in moments of worry. She reported feeling better after discussing the situation with her son, and plans to follow through with the plan we established. She will come back in  two weeks for follow up therapy, and we will continue to utilize CBT to manage her anxiety symptoms.   Plan: Return again in 2 weeks.   Diagnosis: Axis I: Panic Disorder    Axis II: No diagnosis    Heidi Dach, LCSW 08/06/2018

## 2018-08-20 ENCOUNTER — Ambulatory Visit (INDEPENDENT_AMBULATORY_CARE_PROVIDER_SITE_OTHER): Payer: Medicare Other | Admitting: Psychiatry

## 2018-08-20 ENCOUNTER — Encounter: Payer: Self-pay | Admitting: Psychiatry

## 2018-08-20 VITALS — BP 133/78 | HR 88 | Ht 65.0 in | Wt 125.6 lb

## 2018-08-20 DIAGNOSIS — F410 Panic disorder [episodic paroxysmal anxiety] without agoraphobia: Secondary | ICD-10-CM | POA: Diagnosis not present

## 2018-08-20 DIAGNOSIS — F5105 Insomnia due to other mental disorder: Secondary | ICD-10-CM

## 2018-08-20 DIAGNOSIS — Z634 Disappearance and death of family member: Secondary | ICD-10-CM

## 2018-08-20 MED ORDER — FLUOXETINE HCL 20 MG PO CAPS: 20.0000 mg | ORAL_CAPSULE | Freq: Every day | ORAL | 1 refills | Status: DC

## 2018-08-20 NOTE — Progress Notes (Signed)
Psychiatric Initial Adult Assessment   Patient Identification: Heidi [name] MRN:  [id number] Date of Evaluation:  08/20/2018 Referral Source: Leotis Shames MD Chief Complaint:  ' I am here to establish care." Chief Complaint    Establish Care     Visit Diagnosis:    ICD-10-CM   1. Panic disorder F41.0 FLUoxetine (PROZAC) 20 MG capsule  2. Insomnia due to mental condition F51.05   3. Bereavement Z63.4     History of Present Illness:  Heidi [name] is a 62 year old Caucasian female, divorced, on SSD, lives in [place], has a history of panic attacks, COPD , GERD, presented to the clinic today to establish care.  Patient reports she has been struggling with panic attacks since the past 1 year or so.  She describes her panic symptoms as racing heart rate, shortness of breath, feeling of impending doom, nervousness that can peak in a few minutes and last for 5-10 minutes or so.  She reports she tries to calm herself by using relaxation techniques.  She reports she also takes Xanax which was prescribed to her 2 months ago by her primary medical doctor.  She is also currently on BuSpar started recently.  She reports the medications as helpful to some extent.  She reports she also has been taking Lexapro which she has been on since the past several years.  She is on 20 mg.  She does not know if the Lexapro is helpful at this point.  She reports she constantly worries about having another panic attack and avoid certain social situations because she is afraid she may get a panic attack if she go into those situations.  Patient denies any significant depressive symptoms.  She denies manic or hypomanic symptoms.  She denies any perceptual disturbances.  Patient does report sleep problems.  She reports she currently takes Unisom 100 mg at bedtime but her sleep continues to be restless.  She has difficulty falling asleep as well as maintaining sleep.  She reports she has been struggling with sleep all  her life.  Patient does report several psychosocial stressors.  She reports her health problems is a stressor for her.  She has COPD and struggles  being physically active due to her limitations.  She also reports relationship conflict with an older brother as well as her son.  She reports her son currently has legal issues and struggle with drug abuse as well as mood symptoms.  She reports he is currently in jail .  She also reports  that her father passed away recently.  She reports she continues to grieve his loss.  She has started psychotherapy sessions with Ms. Heidi Dach here in clinic.       Associated Signs/Symptoms: Depression Symptoms:  anxiety, panic attacks, disturbed sleep, (Hypo) Manic Symptoms:  denies Anxiety Symptoms:  Panic Symptoms, Psychotic Symptoms:  denies PTSD Symptoms: Negative  Past Psychiatric History: Patient was being treated by her primary medical doctor.  She is on Lexapro ( past several years) as well as BuSpar and Xanax which were recently started.  Denies any inpatient mental health admissions.  Patient denies any suicide attempts.  Previous Psychotropic Medications: Yes BuSpar, Xanax, Lexapro, Zoloft,restoril  Substance Abuse History in the last 12 months:  No.  Consequences of Substance Abuse: Negative  Past Medical History:  Past Medical History:  Diagnosis Date  . Anxiety   . COPD (chronic obstructive pulmonary disease) (HCC)   . GERD (gastroesophageal reflux disease)   . Hypotension  limiting med titration  . NSTEMI (non-ST elevated myocardial infarction) [facility]) 2013   12/2011 with normal coronaries possibly secondary to Takotsubo (EF 30-35% by echo), occurred following two episodes of acute respiratory distress  . Pneumonia 11/2016   history of  . 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 ABDOMINAL HYSTERECTOMY    . CARDIAC CATHETERIZATION    . CHOLECYSTECTOMY N/A 06/05/2017   Procedure:  LAPAROSCOPIC CHOLECYSTECTOMY;  Surgeon: Nadeen Landau, MD;  Location: [facility] ORS;  Service: General;  Laterality: N/A;  . FRACTURE SURGERY Left 07/2016   leg compound fraction  . LEFT HEART CATHETERIZATION WITH CORONARY ANGIOGRAM N/A 01/23/2012   Procedure: LEFT HEART CATHETERIZATION WITH CORONARY ANGIOGRAM;  Surgeon: Dolores Patty, MD;  Location: [facility] CATH LAB;  Service: Cardiovascular;  Laterality: N/A;  . vagina rectal repair     after childbirth    Family Psychiatric History: Maternal Grandfather-committed suicide-shot himself.  Mother-depression, paternal aunt-depression  Family History:  Family History  Problem Relation Age of Onset  . Heart attack Mother        deceased from massive MI at age 48  . Liver disease Father        fatty liver; living, age 3  . Stroke Brother        at age 5, living   . Hypertension Brother        living, age 23    Social History:   Social History   Socioeconomic History  . Marital status: Single    Spouse name: Not on file  . Number of children: Not on file  . Years of education: Not on file  . Highest education level: Not on file  Occupational History  . 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 Financial resource strain: Not on file  . Food insecurity:    Worry: Not on file    Inability: Not on file  . Transportation needs:    Medical: Not on file    Non-medical: Not on file  Tobacco Use  . Smoking status: Former Smoker    Packs/day: 1.00    Years: 30.00    Pack years: 30.00    Types: Cigarettes    Last attempt to quit: 01/21/2012    Years since quitting: 6.5  . Smokeless tobacco: Never Used  Substance and Sexual Activity  . Alcohol use: Yes    Comment: 1-2 beers per week  . Drug use: No  . Sexual activity: Yes    Birth control/protection: Surgical  Lifestyle  . Physical activity:    Days per week: Not on file    Minutes per session: Not on file  . Stress: Not on file   Relationships  . Social connections:    Talks on phone: Not on file    Gets together: Not on file    Attends religious service: Not on file    Active member of club or organization: Not on file    Attends meetings of clubs or organizations: Not on file    Relationship status: Not on file  Other Topics Concern  . Not on file  Social History Narrative   Ms. 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 She has two children, a daughter and son, who live in [place] area. She lives with her brother.    Additional Social History: Patient is divorced.  She is on  disability.  She has 1 daughter and 1 son.  She has a good relationship with her daughter.  She lives in [place] .  Allergies:   Allergies  Allergen Reactions  . Codeine Nausea Only  . Levaquin [Levofloxacin In D5w] Hives  . Propranolol Other (See Comments)    dizziness  . Zoloft [Sertraline Hcl] Other (See Comments)    Increased anxiety    Metabolic Disorder Labs: Lab Results  Component Value Date   HGBA1C 5.6 01/23/2012   MPG 114 01/23/2012   No results found for: PROLACTIN Lab Results  Component Value Date   CHOL 134 01/24/2012   TRIG 65 01/24/2012   HDL 80 01/24/2012   CHOLHDL 1.7 01/24/2012   VLDL 13 01/24/2012   LDLCALC 41 01/24/2012   LDLCALC 54 07/12/2009     Current Medications: Current Outpatient Medications  Medication Sig Dispense Refill  . acetaminophen (TYLENOL) 325 MG tablet Take 650 mg by mouth every 6 (six) hours as needed for mild pain or moderate pain.    Marland Kitchen albuterol (PROVENTIL HFA;VENTOLIN HFA) 108 (90 Base) MCG/ACT inhaler Inhale 1-2 puffs into the lungs every 4 (four) hours as needed for wheezing or shortness of breath.    Marland Kitchen albuterol (PROVENTIL) (2.5 MG/3ML) 0.083% nebulizer solution Take 3 mLs (2.5 mg total) by nebulization every 6 (six) hours as needed for wheezing or shortness of breath. 360 mL 11  . ALPRAZolam (XANAX) 0.25 MG tablet Take 0.25 mg by mouth 2 (two)  times daily.     . busPIRone (BUSPAR) 7.5 MG tablet Take 7.5 mg by mouth 2 (two) times daily.    Marland Kitchen doxylamine, Sleep, (UNISOM) 25 MG tablet Take 50 mg by mouth at bedtime.    . fluticasone (FLONASE) 50 MCG/ACT nasal spray USE 2 SPRAYS IN EACH NOSTRIL ONCE DAILY 16 g 5  . fluticasone (FLONASE) 50 MCG/ACT nasal spray Place 2 sprays into both nostrils daily. 16 g 5  . Fluticasone-Umeclidin-Vilant (TRELEGY ELLIPTA) 100-62.5-25 MCG/INH AEPB Inhale 1 puff into the lungs daily. 1 each 5  . Ipratropium-Albuterol (COMBIVENT RESPIMAT) 20-100 MCG/ACT AERS respimat INHALE ONE PUFF BY MOUTH EVERY 6 HOURS AS NEEDED FOR WHEEZING 1 Inhaler 5  . OXYGEN Inhale 2 L into the lungs as needed.    . pantoprazole (PROTONIX) 40 MG tablet Take 40 mg by mouth daily. Take 30-60 min before first meal of the day     . FLUoxetine (PROZAC) 20 MG capsule Take 1 capsule (20 mg total) by mouth daily. 30 capsule 1   No current facility-administered medications for this visit.     Neurologic: Headache: No Seizure: No Paresthesias:No  Musculoskeletal: Strength & Muscle Tone: within normal limits Gait & Station: normal Patient leans: N/A  Psychiatric Specialty Exam: Review of Systems  Psychiatric/Behavioral: The patient is nervous/anxious and has insomnia.   All other systems reviewed and are negative.   Blood pressure 133/78, pulse 88, height 5\' 5"  (1.651 m), weight 125 lb 9.6 oz (57 kg), SpO2 92 %.Body mass index is 20.9 kg/m.  General Appearance: Casual  Eye Contact:  Fair  Speech:  Clear and Coherent  Volume:  Normal  Mood:  Anxious  Affect:  Appropriate  Thought Process:  Goal Directed and Descriptions of Associations: Intact  Orientation:  Full (Time, Place, and Person)  Thought Content:  Logical  Suicidal Thoughts:  No  Homicidal Thoughts:  No  Memory:  Immediate;   Fair Recent;   Fair Remote;   Fair  Judgement:  Fair  Insight:  Fair  Psychomotor Activity:  Normal  Concentration:  Concentration: Fair  and Attention Span: Fair  Recall:  [employer] of Knowledge:Fair  Language: Fair  Akathisia:  No  Handed:  Right  AIMS (if indicated): na  Assets:  Communication Skills Desire for Improvement Housing Social Support Transportation  ADL's:  Intact  Cognition: WNL  Sleep: restless    Treatment Plan Summary:Shantana is a 62 year old Caucasian female, on disability, lives in [place], history of anxiety symptoms, insomnia, COPD, GERD, presented to the clinic today to establish care.  Patient is biologically predisposed given her medical problems as well as her family history of mental health problems.  She also has several psychosocial stressors including her own health problems, relationship stressors as well as her son having legal issues.  Patient has good social support from her daughter.  Patient denies any suicidality.  Patient is motivated to stay on medications as well as psychotherapy sessions.  Plan as noted below Medication management and Plan as noted below Plan Panic disorder Discontinue Lexapro for lack of efficacy. Start Prozac 20 mg p.o. daily. Continue BuSpar 7.5 mg twice daily. Continue Xanax 0.5 mg twice a day as needed for anxiety symptoms-prescribed by primary medical doctor.  Discussed with patient the risk of being on benzodiazepine therapy long-term.  Discussed with her to skip doses once or twice a week and also to possibly take a half dose when possible.   For insomnia Patient will start working on her sleep hygiene. Will add a sleep aid when she returns.  She will continue Unisom over-the-counter which she is on now.  Bereavement Grief counseling per Therapist Ms.Tasia Catchings.  Her TSH was reviewed in EHR-06/17/2018-within normal limits.  She will continue psychotherapy sessions with Ms. Heidi Dach.  Follow-up in clinic in 10-12 days or sooner if needed.  More than 50 % of the time was spent for psychoeducation and supportive psychotherapy and care  coordination.  This note was generated in part or whole with voice recognition software. Voice recognition is usually quite accurate but there are transcription errors that can and very often do occur. I apologize for any typographical errors that were not detected and corrected.        Jomarie Longs, MD 10/22/20195:07 PM

## 2018-08-20 NOTE — Patient Instructions (Signed)
Fluoxetine capsules or tablets (Depression/Mood Disorders) What is this medicine? FLUOXETINE (floo OX e teen) belongs to a class of drugs known as selective serotonin reuptake inhibitors (SSRIs). It helps to treat mood problems such as depression, obsessive compulsive disorder, and panic attacks. It can also treat certain eating disorders. This medicine may be used for other purposes; ask your health care provider or pharmacist if you have questions. COMMON BRAND NAME(S): Prozac What should I tell my health care provider before I take this medicine? They need to know if you have any of these conditions: -bipolar disorder or a family history of bipolar disorder -bleeding disorders -glaucoma -heart disease -liver disease -low levels of sodium in the blood -seizures -suicidal thoughts, plans, or attempt; a previous suicide attempt by you or a family member -take MAOIs like Carbex, Eldepryl, Marplan, Nardil, and Parnate -take medicines that treat or prevent blood clots -thyroid disease -an unusual or allergic reaction to fluoxetine, other medicines, foods, dyes, or preservatives -pregnant or trying to get pregnant -breast-feeding How should I use this medicine? Take this medicine by mouth with a glass of water. Follow the directions on the prescription label. You can take this medicine with or without food. Take your medicine at regular intervals. Do not take it more often than directed. Do not stop taking this medicine suddenly except upon the advice of your doctor. Stopping this medicine too quickly may cause serious side effects or your condition may worsen. A special MedGuide will be given to you by the pharmacist with each prescription and refill. Be sure to read this information carefully each time. Talk to your pediatrician regarding the use of this medicine in children. While this drug may be prescribed for children as young as 7 years for selected conditions, precautions do  apply. Overdosage: If you think you have taken too much of this medicine contact a poison control center or emergency room at once. NOTE: This medicine is only for you. Do not share this medicine with others. What if I miss a dose? If you miss a dose, skip the missed dose and go back to your regular dosing schedule. Do not take double or extra doses. What may interact with this medicine? Do not take this medicine with any of the following medications: -other medicines containing fluoxetine, like Sarafem or Symbyax -cisapride -linezolid -MAOIs like Carbex, Eldepryl, Marplan, Nardil, and Parnate -methylene blue (injected into a vein) -pimozide -thioridazine This medicine may also interact with the following medications: -alcohol -amphetamines -aspirin and aspirin-like medicines -carbamazepine -certain medicines for depression, anxiety, or psychotic disturbances -certain medicines for migraine headaches like almotriptan, eletriptan, frovatriptan, naratriptan, rizatriptan, sumatriptan, zolmitriptan -digoxin -diuretics -fentanyl -flecainide -furazolidone -isoniazid -lithium -medicines for sleep -medicines that treat or prevent blood clots like warfarin, enoxaparin, and dalteparin -NSAIDs, medicines for pain and inflammation, like ibuprofen or naproxen -phenytoin -procarbazine -propafenone -rasagiline -ritonavir -supplements like St. John's wort, kava kava, valerian -tramadol -tryptophan -vinblastine This list may not describe all possible interactions. Give your health care provider a list of all the medicines, herbs, non-prescription drugs, or dietary supplements you use. Also tell them if you smoke, drink alcohol, or use illegal drugs. Some items may interact with your medicine. What should I watch for while using this medicine? Tell your doctor if your symptoms do not get better or if they get worse. Visit your doctor or health care professional for regular checks on your  progress. Because it may take several weeks to see the full effects of this medicine, it   is important to continue your treatment as prescribed by your doctor. Patients and their families should watch out for new or worsening thoughts of suicide or depression. Also watch out for sudden changes in feelings such as feeling anxious, agitated, panicky, irritable, hostile, aggressive, impulsive, severely restless, overly excited and hyperactive, or not being able to sleep. If this happens, especially at the beginning of treatment or after a change in dose, call your health care professional. You may get drowsy or dizzy. Do not drive, use machinery, or do anything that needs mental alertness until you know how this medicine affects you. Do not stand or sit up quickly, especially if you are an older patient. This reduces the risk of dizzy or fainting spells. Alcohol may interfere with the effect of this medicine. Avoid alcoholic drinks. Your mouth may get dry. Chewing sugarless gum or sucking hard candy, and drinking plenty of water may help. Contact your doctor if the problem does not go away or is severe. This medicine may affect blood sugar levels. If you have diabetes, check with your doctor or health care professional before you change your diet or the dose of your diabetic medicine. What side effects may I notice from receiving this medicine? Side effects that you should report to your doctor or health care professional as soon as possible: -allergic reactions like skin rash, itching or hives, swelling of the face, lips, or tongue -anxious -black, tarry stools -breathing problems -changes in vision -confusion -elevated mood, decreased need for sleep, racing thoughts, impulsive behavior -eye pain -fast, irregular heartbeat -feeling faint or lightheaded, falls -feeling agitated, angry, or irritable -hallucination, loss of contact with reality -loss of balance or coordination -loss of memory -painful  or prolonged erections -restlessness, pacing, inability to keep still -seizures -stiff muscles -suicidal thoughts or other mood changes -trouble sleeping -unusual bleeding or bruising -unusually weak or tired -vomiting Side effects that usually do not require medical attention (report to your doctor or health care professional if they continue or are bothersome): -change in appetite or weight -change in sex drive or performance -diarrhea -dry mouth -headache -increased sweating -nausea -tremors This list may not describe all possible side effects. Call your doctor for medical advice about side effects. You may report side effects to FDA at 1-800-FDA-1088. Where should I keep my medicine? Keep out of the reach of children. Store at room temperature between 15 and 30 degrees C (59 and 86 degrees F). Throw away any unused medicine after the expiration date. NOTE: This sheet is a summary. It may not cover all possible information. If you have questions about this medicine, talk to your doctor, pharmacist, or health care provider.  2018 Elsevier/Gold Standard (2016-03-18 15:55:27)  

## 2018-08-23 ENCOUNTER — Other Ambulatory Visit: Payer: Self-pay | Admitting: Pulmonary Disease

## 2018-08-23 DIAGNOSIS — J432 Centrilobular emphysema: Secondary | ICD-10-CM

## 2018-09-02 ENCOUNTER — Ambulatory Visit (INDEPENDENT_AMBULATORY_CARE_PROVIDER_SITE_OTHER): Payer: Medicare Other | Admitting: Psychiatry

## 2018-09-02 ENCOUNTER — Other Ambulatory Visit: Payer: Self-pay

## 2018-09-02 ENCOUNTER — Encounter: Payer: Self-pay | Admitting: Psychiatry

## 2018-09-02 VITALS — BP 155/90 | HR 96 | Temp 97.6°F | Wt 125.6 lb

## 2018-09-02 DIAGNOSIS — F5105 Insomnia due to other mental disorder: Secondary | ICD-10-CM | POA: Diagnosis not present

## 2018-09-02 DIAGNOSIS — F410 Panic disorder [episodic paroxysmal anxiety] without agoraphobia: Secondary | ICD-10-CM | POA: Diagnosis not present

## 2018-09-02 DIAGNOSIS — Z634 Disappearance and death of family member: Secondary | ICD-10-CM | POA: Diagnosis not present

## 2018-09-02 MED ORDER — BUSPIRONE HCL 7.5 MG PO TABS: 7.5000 mg | ORAL_TABLET | Freq: Two times a day (BID) | ORAL | 1 refills | Status: DC

## 2018-09-02 MED ORDER — MIRTAZAPINE 7.5 MG PO TABS: 7.5000 mg | ORAL_TABLET | Freq: Every day | ORAL | 0 refills | Status: DC

## 2018-09-02 NOTE — Progress Notes (Signed)
BH MD OP Progress Note  09/02/2018 1:39 PM Heidi [name]  MRN:  [id number]  Chief Complaint: ' I am here for follow up." Chief Complaint    Follow-up; Medication Refill     HPI: Heidi [name] is a 62 yr old patient female, divorced, on SSD, lives in [place], has a history of panic attacks, COPD, GERD, presented to the clinic today for a follow-up visit.  Patient reports she continues to struggle with panic attacks.  She reports most recently she had an incident when she was at a birthday party and she felt the panic symptoms coming on.  She reports she used some cold water on her face and that helped her to relax.  She continues to be compliant with her BuSpar.  She however reports she has to take it with meals otherwise she feels dizzy.  She has been tolerating it so far since she is taking it with food.  Patient however reports she stopped taking the Prozac.  She reports when she started taking it she felt some side effects of feeling as though she were in a tunnel.  She would like to try another medication.  Discussed mirtazapine.  Discussed with her mirtazapine helps with anxiety symptoms as well as her sleep.  She agreed to try it.  She continues to struggle with sleep.  She is on Unisom 100 mg and she continues to have sleep problems.  She will follow-up with Ms. Tasia Catchings tomorrow for her psychotherapy sessions.  She denies any other concerns today. Visit Diagnosis:    ICD-10-CM   1. Panic disorder F41.0 mirtazapine (REMERON) 7.5 MG tablet  2. Insomnia due to mental condition F51.05 mirtazapine (REMERON) 7.5 MG tablet  3. Bereavement Z63.4     Past Psychiatric History: I have reviewed past psychiatric history from my progress note on 08/20/2018  Past Medical History:  Past Medical History:  Diagnosis Date  . Anxiety   . COPD (chronic obstructive pulmonary disease) (HCC)   . GERD (gastroesophageal reflux disease)   . Hypotension    limiting med titration  . NSTEMI (non-ST  elevated myocardial infarction) [facility]) 2013   12/2011 with normal coronaries possibly secondary to Takotsubo (EF 30-35% by echo), occurred following two episodes of acute respiratory distress  . Pneumonia 11/2016   history of  . 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 ABDOMINAL HYSTERECTOMY    . CARDIAC CATHETERIZATION    . CHOLECYSTECTOMY N/A 06/05/2017   Procedure: LAPAROSCOPIC CHOLECYSTECTOMY;  Surgeon: Nadeen Landau, MD;  Location: [facility] ORS;  Service: General;  Laterality: N/A;  . FRACTURE SURGERY Left 07/2016   leg compound fraction  . LEFT HEART CATHETERIZATION WITH CORONARY ANGIOGRAM N/A 01/23/2012   Procedure: LEFT HEART CATHETERIZATION WITH CORONARY ANGIOGRAM;  Surgeon: Dolores Patty, MD;  Location: [facility] CATH LAB;  Service: Cardiovascular;  Laterality: N/A;  . vagina rectal repair     after childbirth    Family Psychiatric History: Have reviewed family psychiatric history from my progress note on 08/20/2018.  Family History:  Family History  Problem Relation Age of Onset  . Heart attack Mother        deceased from massive MI at age 50  . Liver disease Father        fatty liver; living, age 70  . Stroke Brother        at age 88, living   . Hypertension Brother        living, age  91    Social History: Have reviewed social history from my progress note on 08/20/2018. Social History   Socioeconomic History  . Marital status: Single    Spouse name: Not on file  . Number of children: Not on file  . Years of education: Not on file  . Highest education level: Not on file  Occupational History  . 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 Financial resource strain: Not on file  . Food insecurity:    Worry: Not on file    Inability: Not on file  . Transportation needs:    Medical: Not on file    Non-medical: Not on file  Tobacco Use  . Smoking status: Former Smoker    Packs/day:  1.00    Years: 30.00    Pack years: 30.00    Types: Cigarettes    Last attempt to quit: 01/21/2012    Years since quitting: 6.6  . Smokeless tobacco: Never Used  Substance and Sexual Activity  . Alcohol use: Yes    Comment: 1-2 beers per week  . Drug use: No  . Sexual activity: Yes    Birth control/protection: Surgical  Lifestyle  . Physical activity:    Days per week: Not on file    Minutes per session: Not on file  . Stress: Not on file  Relationships  . Social connections:    Talks on phone: Not on file    Gets together: Not on file    Attends religious service: Not on file    Active member of club or organization: Not on file    Attends meetings of clubs or organizations: Not on file    Relationship status: Not on file  Other Topics Concern  . Not on file  Social History Narrative   Ms. 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 She has two children, a daughter and son, who live in [place] area. She lives with her brother.    Allergies:  Allergies  Allergen Reactions  . Codeine Nausea Only  . Levaquin [Levofloxacin In D5w] Hives  . Propranolol Other (See Comments)    dizziness  . Zoloft [Sertraline Hcl] Other (See Comments)    Increased anxiety    Metabolic Disorder Labs: Lab Results  Component Value Date   HGBA1C 5.6 01/23/2012   MPG 114 01/23/2012   No results found for: PROLACTIN Lab Results  Component Value Date   CHOL 134 01/24/2012   TRIG 65 01/24/2012   HDL 80 01/24/2012   CHOLHDL 1.7 01/24/2012   VLDL 13 01/24/2012   LDLCALC 41 01/24/2012   LDLCALC 54 07/12/2009   Lab Results  Component Value Date   TSH 0.331 (L) 01/23/2012   TSH 0.625 07/12/2009    Therapeutic Level Labs: No results found for: LITHIUM No results found for: VALPROATE No components found for:  CBMZ  Current Medications: Current Outpatient Medications  Medication Sig Dispense Refill  . acetaminophen (TYLENOL) 325 MG tablet Take 650 mg by mouth  every 6 (six) hours as needed for mild pain or moderate pain.    Marland Kitchen albuterol (PROVENTIL HFA;VENTOLIN HFA) 108 (90 Base) MCG/ACT inhaler Inhale 1-2 puffs into the lungs every 4 (four) hours as needed for wheezing or shortness of breath.    Marland Kitchen albuterol (PROVENTIL) (2.5 MG/3ML) 0.083% nebulizer solution Take 3 mLs (2.5 mg total) by nebulization every 6 (six) hours as needed for wheezing or  shortness of breath. 360 mL 11  . ALPRAZolam (XANAX) 0.25 MG tablet Take 0.25 mg by mouth 2 (two) times daily.     . busPIRone (BUSPAR) 7.5 MG tablet Take 1 tablet (7.5 mg total) by mouth 2 (two) times daily. 60 tablet 1  . fluticasone (FLONASE) 50 MCG/ACT nasal spray USE 2 SPRAYS IN EACH NOSTRIL ONCE DAILY 16 g 5  . fluticasone (FLONASE) 50 MCG/ACT nasal spray Place 2 sprays into both nostrils daily. 16 g 5  . Ipratropium-Albuterol (COMBIVENT RESPIMAT) 20-100 MCG/ACT AERS respimat INHALE ONE PUFF BY MOUTH EVERY 6 HOURS AS NEEDED FOR WHEEZING 1 Inhaler 5  . OXYGEN Inhale 2 L into the lungs as needed.    . pantoprazole (PROTONIX) 40 MG tablet Take 40 mg by mouth daily. Take 30-60 min before first meal of the day     . TRELEGY ELLIPTA 100-62.5-25 MCG/INH AEPB INHALE 1 PUFF ONCE DAILY 180 each 0  . mirtazapine (REMERON) 7.5 MG tablet Take 1 tablet (7.5 mg total) by mouth at bedtime. For sleep and mood symptoms 30 tablet 0  . ranitidine (ZANTAC) 150 MG tablet      No current facility-administered medications for this visit.      Musculoskeletal: Strength & Muscle Tone: within normal limits Gait & Station: normal Patient leans: N/A  Psychiatric Specialty Exam: Review of Systems  Psychiatric/Behavioral: The patient is nervous/anxious and has insomnia.   All other systems reviewed and are negative.   Blood pressure (!) 155/90, pulse 96, temperature 97.6 F (36.4 C), temperature source Oral, weight 125 lb 9.6 oz (57 kg).Body mass index is 20.9 kg/m.  General Appearance: Casual  Eye Contact:  Fair  Speech:   Clear and Coherent  Volume:  Normal  Mood:  Anxious  Affect:  Congruent  Thought Process:  Goal Directed and Descriptions of Associations: Intact  Orientation:  Full (Time, Place, and Person)  Thought Content: Logical   Suicidal Thoughts:  No  Homicidal Thoughts:  No  Memory:  Immediate;   Fair Recent;   Fair Remote;   Fair  Judgement:  Fair  Insight:  Fair  Psychomotor Activity:  Normal  Concentration:  Concentration: Fair and Attention Span: Fair  Recall:  [employer] of Knowledge: Fair  Language: Fair  Akathisia:  No  Handed:  Right  AIMS (if indicated): na  Assets:  Communication Skills Desire for Improvement Social Support  ADL's:  Intact  Cognition: WNL  Sleep:  Poor   Screenings: PHQ2-9     Pulmonary Rehab from 05/26/2016 in [facility] Cardiac and Pulmonary Rehab Pulmonary Rehab from 02/15/2016 in [facility] Cardiac and Pulmonary Rehab  PHQ-2 Total Score  1  0  PHQ-9 Total Score  5  3       Assessment and Plan: Lashannon is a 62 year old Caucasian female on disability, lives in [place], history of anxiety, insomnia, COPD, GERD, presented to the clinic today for a follow-up visit.  Patient is biologically predisposed given her medical problems as well as a family history of mental health problems.  She also has several psychosocial stressors including her own health problems, relationship stressors as well as her son having legal issues.  Patient has good social support from her daughter.  Patient currently denies any suicidality and is motivated to stay on medications as well as psychotherapy sessions.  Plan Panic disorder Discontinue Prozac for side effects. Start mirtazapine 7.5 mg p.o. nightly Continue BuSpar 7.5 mg p.o. twice daily. Continue Xanax as prescribed.  Discussed with patient  to skip at least one dose per week.  She is aware about the risk of being on long-term benzodiazepine therapy.  For insomnia Provided sleep hygiene techniques. Discontinue Unisom. Start  mirtazapine 7.5 mg p.o. nightly  For bereavement She will continue psychotherapy sessions.  Follow-up in clinic in 2 weeks or sooner if needed.  More than 50 % of the time was spent for psychoeducation and supportive psychotherapy and care coordination.  This note was generated in part or whole with voice recognition software. Voice recognition is usually quite accurate but there are transcription errors that can and very often do occur. I apologize for any typographical errors that were not detected and corrected.         Jomarie Longs, MD 09/02/2018, 1:39 PM

## 2018-09-03 ENCOUNTER — Ambulatory Visit (INDEPENDENT_AMBULATORY_CARE_PROVIDER_SITE_OTHER): Payer: Medicare Other | Admitting: Licensed Clinical Social Worker

## 2018-09-03 ENCOUNTER — Encounter: Payer: Self-pay | Admitting: Licensed Clinical Social Worker

## 2018-09-03 DIAGNOSIS — F410 Panic disorder [episodic paroxysmal anxiety] without agoraphobia: Secondary | ICD-10-CM

## 2018-09-03 NOTE — Progress Notes (Signed)
   THERAPIST PROGRESS NOTE  Session Time: 1230  Participation Level: Active  Behavioral Response: Well GroomedAlertAnxious  Type of Therapy: Individual Therapy  Treatment Goals addressed: Coping  Interventions: CBT  Summary: Heidi [name] is a 62 y.o. female who presents with symptoms related to her diagnosis. She reports things have been going well since our last session. She stated she was able to resolve feelings of stress around her son being in prison by, "when he calls, I put him on speaker because he won't be a bully in front of my partner. That nipped that right out." [name] reported having one panic attack since our last session. She reports, "I'm a mouth breather, and I got in a little too much air and I started coughing. I wasn't able to catch my breath." [name] reports she was able to calm herself during the panic attack by going to the sink and running her hand under cold water, "I needed to rewire the way my brain was thinking." We discussed other ways to mange panic in the event she is not near a sink. [name] reported one way she could do this by naming things she sees in the room she is in. [name] also reported the MD recently changed her medication which is supposed to improve her sleep.    Suicidal/Homicidal: No  Therapist Response: [name] spoke openly about her anxiety and panic symptoms and was able to describe the ways she has been managing her panic attacks. She is able to process the event well, and discuss it in detail with LCSW. We will continue to utilize CBT and DBT moving forward.   Plan: Return again in 4 weeks.  Diagnosis: Axis I: Panic Disorder    Axis II: No diagnosis    Heidi Dach, LCSW 09/03/2018

## 2018-09-16 ENCOUNTER — Ambulatory Visit (INDEPENDENT_AMBULATORY_CARE_PROVIDER_SITE_OTHER): Payer: Medicare Other | Admitting: Psychiatry

## 2018-09-16 ENCOUNTER — Encounter: Payer: Self-pay | Admitting: Psychiatry

## 2018-09-16 VITALS — BP 159/50 | HR 90 | Ht 65.0 in | Wt 127.0 lb

## 2018-09-16 DIAGNOSIS — F410 Panic disorder [episodic paroxysmal anxiety] without agoraphobia: Secondary | ICD-10-CM | POA: Diagnosis not present

## 2018-09-16 DIAGNOSIS — F5105 Insomnia due to other mental disorder: Secondary | ICD-10-CM

## 2018-09-16 DIAGNOSIS — Z634 Disappearance and death of family member: Secondary | ICD-10-CM | POA: Diagnosis not present

## 2018-09-16 MED ORDER — ESCITALOPRAM OXALATE 20 MG PO TABS: 20.0000 mg | ORAL_TABLET | Freq: Every day | ORAL | 0 refills | Status: DC

## 2018-09-16 MED ORDER — MIRTAZAPINE 7.5 MG PO TABS: 7.5000 mg | ORAL_TABLET | Freq: Every day | ORAL | 0 refills | Status: DC

## 2018-09-16 NOTE — Progress Notes (Signed)
BH MD OP Progress Note  09/16/2018 5:22 PM Heidi [name]  MRN:  [id number]  Chief Complaint: ' I am here for follow up." Chief Complaint    Follow-up     HPI: Heidi [name] is a 62 year old Caucasian female, divorced, on SSD, lives in [place] , has a history of panic attack, COPD, GERD, presented to the clinic today for a follow-up visit.  Patient today reports she did not tolerate the BuSpar well.  Patient reports she had some joint stiffness while on BuSpar.  She stopped the BuSpar and her joint stiffness and pain resolved.  She reports when she stopped the BuSpar she started taking the Lexapro again.  The Lexapro was discontinued a month ago since she continued to have mood symptoms in spite of being on it.  Patient reports she is tolerating the mirtazapine well at night.  She reports sleep is improved.  She is not taking the Unisom anymore.  She feels she is doing better on the Lexapro and the mirtazapine combination at this time.  She reports her mood symptoms are more stable.  She denies any significant panic attacks or depressive symptoms.  Patient reports she continues to work with Ms. Heidi Dach in psychotherapy sessions.  She reports that therapy sessions are going well.  Patient denies any suicidality.  Patient denies any perceptual disturbances.  Patient denies any other concerns today. Visit Diagnosis:    ICD-10-CM   1. Panic disorder F41.0 mirtazapine (REMERON) 7.5 MG tablet  2. Insomnia due to mental condition F51.05 mirtazapine (REMERON) 7.5 MG tablet  3. Bereavement Z63.4     Past Psychiatric History: I have reviewed past psychiatric history from my progress note on 08/20/2018  Past Medical History:  Past Medical History:  Diagnosis Date  . Anxiety   . COPD (chronic obstructive pulmonary disease) (HCC)   . GERD (gastroesophageal reflux disease)   . Hypotension    limiting med titration  . NSTEMI (non-ST elevated myocardial infarction) [facility]) 2013   12/2011 with  normal coronaries possibly secondary to Takotsubo (EF 30-35% by echo), occurred following two episodes of acute respiratory distress  . Pneumonia 11/2016   history of  . 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 ABDOMINAL HYSTERECTOMY    . CARDIAC CATHETERIZATION    . CHOLECYSTECTOMY N/A 06/05/2017   Procedure: LAPAROSCOPIC CHOLECYSTECTOMY;  Surgeon: Nadeen Landau, MD;  Location: [facility] ORS;  Service: General;  Laterality: N/A;  . FRACTURE SURGERY Left 07/2016   leg compound fraction  . LEFT HEART CATHETERIZATION WITH CORONARY ANGIOGRAM N/A 01/23/2012   Procedure: LEFT HEART CATHETERIZATION WITH CORONARY ANGIOGRAM;  Surgeon: Dolores Patty, MD;  Location: [facility] CATH LAB;  Service: Cardiovascular;  Laterality: N/A;  . vagina rectal repair     after childbirth    Family Psychiatric History: I have reviewed family psychiatric history from my progress note on 08/20/2018  Family History:  Family History  Problem Relation Age of Onset  . Heart attack Mother        deceased from massive MI at age 80  . Liver disease Father        fatty liver; living, age 68  . Stroke Brother        at age 44, living   . Hypertension Brother        living, age 70    Social History: Have reviewed social history from my progress note on 08/20/2018 Social History   Socioeconomic History  .  Marital status: Single    Spouse name: Not on file  . Number of children: Not on file  . Years of education: Not on file  . Highest education level: Not on file  Occupational History  . 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 Financial resource strain: Not on file  . Food insecurity:    Worry: Not on file    Inability: Not on file  . Transportation needs:    Medical: Not on file    Non-medical: Not on file  Tobacco Use  . Smoking status: Former Smoker    Packs/day: 1.00    Years: 30.00    Pack years: 30.00    Types:  Cigarettes    Last attempt to quit: 01/21/2012    Years since quitting: 6.6  . Smokeless tobacco: Never Used  Substance and Sexual Activity  . Alcohol use: Yes    Comment: 1-2 beers per week  . Drug use: No  . Sexual activity: Yes    Birth control/protection: Surgical  Lifestyle  . Physical activity:    Days per week: Not on file    Minutes per session: Not on file  . Stress: Not on file  Relationships  . Social connections:    Talks on phone: Not on file    Gets together: Not on file    Attends religious service: Not on file    Active member of club or organization: Not on file    Attends meetings of clubs or organizations: Not on file    Relationship status: Not on file  Other Topics Concern  . Not on file  Social History Narrative   Ms. 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 She has two children, a daughter and son, who live in [place] area. She lives with her brother.    Allergies:  Allergies  Allergen Reactions  . Prozac [Fluoxetine] Itching  . Buspar [Buspirone] Other (See Comments)    Ache joints  . Codeine Nausea Only  . Levaquin [Levofloxacin In D5w] Hives  . Propranolol Other (See Comments)    dizziness  . Zoloft [Sertraline Hcl] Other (See Comments)    Increased anxiety    Metabolic Disorder Labs: Lab Results  Component Value Date   HGBA1C 5.6 01/23/2012   MPG 114 01/23/2012   No results found for: PROLACTIN Lab Results  Component Value Date   CHOL 134 01/24/2012   TRIG 65 01/24/2012   HDL 80 01/24/2012   CHOLHDL 1.7 01/24/2012   VLDL 13 01/24/2012   LDLCALC 41 01/24/2012   LDLCALC 54 07/12/2009   Lab Results  Component Value Date   TSH 0.331 (L) 01/23/2012   TSH 0.625 07/12/2009    Therapeutic Level Labs: No results found for: LITHIUM No results found for: VALPROATE No components found for:  CBMZ  Current Medications: Current Outpatient Medications  Medication Sig Dispense Refill  . acetaminophen (TYLENOL)  325 MG tablet Take 650 mg by mouth every 6 (six) hours as needed for mild pain or moderate pain.    Marland Kitchen albuterol (PROVENTIL HFA;VENTOLIN HFA) 108 (90 Base) MCG/ACT inhaler Inhale 1-2 puffs into the lungs every 4 (four) hours as needed for wheezing or shortness of breath.    Marland Kitchen albuterol (PROVENTIL) (2.5 MG/3ML) 0.083% nebulizer solution Take 3 mLs (2.5 mg total) by nebulization every 6 (six) hours as needed for wheezing or shortness of breath. 360 mL 11  .  ALPRAZolam (XANAX) 0.25 MG tablet Take 0.25 mg by mouth 2 (two) times daily.     . fluticasone (FLONASE) 50 MCG/ACT nasal spray USE 2 SPRAYS IN EACH NOSTRIL ONCE DAILY 16 g 5  . fluticasone (FLONASE) 50 MCG/ACT nasal spray Place 2 sprays into both nostrils daily. 16 g 5  . Ipratropium-Albuterol (COMBIVENT RESPIMAT) 20-100 MCG/ACT AERS respimat INHALE ONE PUFF BY MOUTH EVERY 6 HOURS AS NEEDED FOR WHEEZING 1 Inhaler 5  . mirtazapine (REMERON) 7.5 MG tablet Take 1 tablet (7.5 mg total) by mouth at bedtime. For sleep and mood symptoms 90 tablet 0  . OXYGEN Inhale 2 L into the lungs as needed.    . pantoprazole (PROTONIX) 40 MG tablet Take 40 mg by mouth daily. Take 30-60 min before first meal of the day     . ranitidine (ZANTAC) 150 MG tablet     . TRELEGY ELLIPTA 100-62.5-25 MCG/INH AEPB INHALE 1 PUFF ONCE DAILY 180 each 0  . escitalopram (LEXAPRO) 20 MG tablet Take 1 tablet (20 mg total) by mouth daily. 90 tablet 0   No current facility-administered medications for this visit.      Musculoskeletal: Strength & Muscle Tone: within normal limits Gait & Station: normal Patient leans: N/A  Psychiatric Specialty Exam: Review of Systems  Psychiatric/Behavioral: Positive for depression. The patient is nervous/anxious.   All other systems reviewed and are negative.   Blood pressure (!) 159/50, pulse 90, height 5\' 5"  (1.651 m), weight 127 lb (57.6 kg), SpO2 90 %.Body mass index is 21.13 kg/m.  General Appearance: Casual  Eye Contact:  Fair   Speech:  Clear and Coherent  Volume:  Normal  Mood:  Anxious  Affect:  Congruent  Thought Process:  Goal Directed and Descriptions of Associations: Intact  Orientation:  Full (Time, Place, and Person)  Thought Content: Logical   Suicidal Thoughts:  No  Homicidal Thoughts:  No  Memory:  Immediate;   Fair Recent;   Fair Remote;   Fair  Judgement:  Fair  Insight:  Fair  Psychomotor Activity:  Normal  Concentration:  Concentration: Fair and Attention Span: Fair  Recall:  [employer] of Knowledge: Fair  Language: Fair  Akathisia:  No  Handed:  Right  AIMS (if indicated):reports some stiffness with her buspar , but she stopped taking it and feels better now  Assets:  Communication Skills Desire for Improvement Housing Social Support  ADL's:  Intact  Cognition: WNL  Sleep:  improving   Screenings: PHQ2-9     Pulmonary Re[facility] in [facility] Cardiac and Pulmonary Rehab Pulmonary Rehab from 02/15/2016 in [facility] Cardiac and Pulmonary Rehab  PHQ-2 Total Score  1  0  PHQ-9 Total Score  5  3       Assessment and Plan: Rhia is a 62 year old Caucasian female, on disability, lives in [place], history of anxiety, insomnia, COPD, GERD, presented to the clinic today for a follow-up visit.  Patient is biologically predisposed given her medical problems, family history of mental health problems.  She also has several psychosocial stressors including her own health issues, relationship stressors as well as her son having legal problems.  Patient does have good social support from her daughter and is currently in psychotherapy which is going well.  We will continue to make the following medication changes.  Patient is currently making progress on the current medication regimen with regards to her mood and sleep.  Plan Panic disorder Restart Lexapro 20 mg p.o. daily.  Patient  reports she started taking it a few days ago. Continue mirtazapine 7.5 mg p.o. Nightly. Discontinue BuSpar  for side effects. She continues to take Xanax 0.5 mg daily as needed for anxiety symptoms.  She is aware about the risk of being on long-term benzodiazepine therapy.  We will continue to wean it off.  For insomnia Mirtazapine 7.5 mg p.o. nightly  For bereavement Will continue psychotherapy sessions.  Follow up in clinic in 4 weeks or sooner if needed.  More than 50 % of the time was spent for psychoeducation and supportive psychotherapy and care coordination.  This note was generated in part or whole with voice recognition software. Voice recognition is usually quite accurate but there are transcription errors that can and very often do occur. I apologize for any typographical errors that were not detected and corrected.       Jomarie LongsSaramma Shalyn Koral, MD 09/16/2018, 5:22 PM

## 2018-09-30 ENCOUNTER — Ambulatory Visit (INDEPENDENT_AMBULATORY_CARE_PROVIDER_SITE_OTHER): Payer: Medicare Other | Admitting: Pulmonary Disease

## 2018-09-30 ENCOUNTER — Encounter: Payer: Self-pay | Admitting: Pulmonary Disease

## 2018-09-30 VITALS — BP 118/60 | HR 101 | Ht 66.0 in | Wt 127.6 lb

## 2018-09-30 DIAGNOSIS — J441 Chronic obstructive pulmonary disease with (acute) exacerbation: Secondary | ICD-10-CM | POA: Diagnosis not present

## 2018-09-30 DIAGNOSIS — J432 Centrilobular emphysema: Secondary | ICD-10-CM

## 2018-09-30 DIAGNOSIS — J9611 Chronic respiratory failure with hypoxia: Secondary | ICD-10-CM | POA: Diagnosis not present

## 2018-09-30 MED ORDER — PREDNISONE 20 MG PO TABS: 20.0000 mg | ORAL_TABLET | Freq: Every day | ORAL | 0 refills | Status: AC

## 2018-09-30 NOTE — Progress Notes (Signed)
Subjective:    Patient ID: Heidi [name], female    DOB: 04/20/1956, 62 y.o.   MRN: [id number]  Synopsis: Heidi LinksCynthia Mcchristian first saw Dr. Sherene SiresWert with the [facility] in 2013. She has COPD. She smoked heavily up until October of 2013.  She had bad mouth sores from Spiriva.  HPI  Chief Complaint  Patient presents with  . Follow-up    follows for emphysema, breathing doing ok. patient wheezing. uses albuterol once daily.    [name] says that her breathing is "so-so" but she is frsutrated by a worsening dry cough.  She says that she has been taking some mucinex DM (she took about 2 days worth) and the cough calmed down some.  She thinks that she had a little sinus congestion at the time.  She has only been using her sinus inhaler a little more.  She had a recent "stomach virus" with diarrhea and abdominal pain, nothing else.  She has not had pneumonia or flu lately.  She is still taking Trelegy daily.  Her breathing worsens when there are weather changes.  She gets short of breath in the cold weather.  Carrying in groceries or something from the car will make her more short of breath.    She has not been exercising muc lately.  She was going [facility] the gym at [facility] before but she is not doing that anymore.   Not smoking  Had a flu shot already   Past Medical History:  Diagnosis Date  . Anxiety   . COPD (chronic obstructive pulmonary disease) (HCC)   . GERD (gastroesophageal reflux disease)   . Hypotension    limiting med titration  . NSTEMI (non-ST elevated myocardial infarction) [facility](HCC) 2013   12/2011 with normal coronaries possibly secondary to Takotsubo (EF 30-35% by echo), occurred following two episodes of acute respiratory distress  . Pneumonia 11/2016   history of  . 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  HENT: Negative for congestion, postnasal drip, rhinorrhea and sinus pressure.   Respiratory:  Positive for shortness of breath. Negative for cough and wheezing.   Cardiovascular: Negative for chest pain, palpitations and leg swelling.       Objective:   Physical Exam Vitals:   09/30/18 1335  BP: 118/60  Pulse: (!) 101  SpO2: 95%  Weight: 127 lb 9.6 oz (57.9 kg)  Height: 5\' 6"  (1.676 m)    RA  Gen: chronically ill appearing HENT: OP clear, TM's clear, neck supple PULM: Wheezing bilaterally B, normal percussion CV: RRR, no mgr, trace edema GI: BS+, soft, nontender Derm: no cyanosis or rash Psyche: normal mood and affect    PFT: 2013 FEV1 0.72 L (27% predicted) 2018 spiro: FEV 1 0.8L (29% pred)   Chest imaging: May 2018 CT chest images and apparently reviewed showing moderate to severe centrilobular emphysema bilaterally, there is a small lobulated nodule adjacent to the pleura in the right upper lobe, approximately 4mm in size October 2018 chest x-ray images independently reviewed showing emphysema but no infiltrate June 2019 CT chest showed resolution of the nodules,  Records reviewed, she has been seeing a psychiatrist recently for worsening panic disorder.  CBC    Component Value Date/Time   WBC 7.5 08/06/2017 2242   RBC 4.17 08/06/2017 2242   HGB 13.5 08/06/2017 2242   HGB 12.4 11/29/2014 0439   HCT 38.9 08/06/2017 2242   HCT 37.6 11/29/2014 0439  PLT 316 08/06/2017 2242   PLT 293 11/29/2014 0439   MCV 93.3 08/06/2017 2242   MCV 99 11/29/2014 0439   MCH 32.4 08/06/2017 2242   MCHC 34.7 08/06/2017 2242   RDW 14.2 08/06/2017 2242   RDW 13.1 11/29/2014 0439   LYMPHSABS 1.2 03/17/2017 1205   LYMPHSABS 0.8 (L) 11/29/2014 0439   MONOABS 0.8 03/17/2017 1205   MONOABS 0.1 (L) 11/29/2014 0439   EOSABS 0.1 03/17/2017 1205   EOSABS 0.0 11/29/2014 0439   BASOSABS 0.0 03/17/2017 1205   BASOSABS 0.0 11/29/2014 0439       Assessment & Plan:   Centrilobular emphysema (HCC)  Chronic respiratory failure with hypoxia (HCC)  COPD with acute exacerbation  (HCC)  Discussion: Heidi [name] is having a flare of her COPD.  This started with a viral illness recently.  Her allergic rhinitis has been flaring more giving her a dry cough, she has not been compliant with her fluticasone nose spray.  Plan: Severe COPD with acute exacerbation: Take prednisone 20 mg daily x5 days Continue Trelegy 1 puff daily I am glad that your flu shot is up-to-date Go back to pulmonary rehab  Worsening shortness of breath: Go back to pulmonary rehab Use albuterol more frequently during the daytime for shortness of breath,, 2-3 times a day is okay  Allergic rhinitis: I recommend that she use the fluticasone routinely between September and December.  We will see you back in 3 to 4 months or sooner if needed    Updated Medication List Outpatient Encounter Medications as of 09/30/2018  Medication Sig  . acetaminophen (TYLENOL) 325 MG tablet Take 650 mg by mouth every 6 (six) hours as needed for mild pain or moderate pain.  Marland Kitchen albuterol (PROVENTIL HFA;VENTOLIN HFA) 108 (90 Base) MCG/ACT inhaler Inhale 1-2 puffs into the lungs every 4 (four) hours as needed for wheezing or shortness of breath.  Marland Kitchen albuterol (PROVENTIL) (2.5 MG/3ML) 0.083% nebulizer solution Take 3 mLs (2.5 mg total) by nebulization every 6 (six) hours as needed for wheezing or shortness of breath.  . ALPRAZolam (XANAX) 0.25 MG tablet Take 0.25 mg by mouth 2 (two) times daily.   Marland Kitchen escitalopram (LEXAPRO) 20 MG tablet Take 1 tablet (20 mg total) by mouth daily.  . fluticasone (FLONASE) 50 MCG/ACT nasal spray USE 2 SPRAYS IN EACH NOSTRIL ONCE DAILY  . fluticasone (FLONASE) 50 MCG/ACT nasal spray Place 2 sprays into both nostrils daily.  . Ipratropium-Albuterol (COMBIVENT RESPIMAT) 20-100 MCG/ACT AERS respimat INHALE ONE PUFF BY MOUTH EVERY 6 HOURS AS NEEDED FOR WHEEZING  . mirtazapine (REMERON) 7.5 MG tablet Take 1 tablet (7.5 mg total) by mouth at bedtime. For sleep and mood symptoms  . OXYGEN Inhale 2 L into  the lungs as needed.  . pantoprazole (PROTONIX) 40 MG tablet Take 40 mg by mouth daily. Take 30-60 min before first meal of the day   . ranitidine (ZANTAC) 150 MG tablet   . TRELEGY ELLIPTA 100-62.5-25 MCG/INH AEPB INHALE 1 PUFF ONCE DAILY  . predniSONE (DELTASONE) 20 MG tablet Take 1 tablet (20 mg total) by mouth daily with breakfast for 5 days.   No facility-administered encounter medications on file as of 09/30/2018.

## 2018-09-30 NOTE — Patient Instructions (Signed)
COPD with acute exacerbation: Take prednisone 20 mg daily x5 days Continue Trelegy 1 puff daily I am glad that your flu shot is up-to-date Go back to pulmonary rehab  Worsening shortness of breath: Go back to pulmonary rehab Use albuterol more frequently during the daytime for shortness of breath,, 2-3 times a day is okay  Allergic rhinitis: I recommend that she use the fluticasone routinely between September and December.  We will see you back in 3 to 4 months or sooner if needed

## 2018-10-03 ENCOUNTER — Ambulatory Visit: Payer: Medicare Other | Admitting: Licensed Clinical Social Worker

## 2018-10-16 ENCOUNTER — Ambulatory Visit (INDEPENDENT_AMBULATORY_CARE_PROVIDER_SITE_OTHER): Payer: Medicare Other | Admitting: Psychiatry

## 2018-10-16 ENCOUNTER — Other Ambulatory Visit: Payer: Self-pay

## 2018-10-16 ENCOUNTER — Encounter: Payer: Self-pay | Admitting: Psychiatry

## 2018-10-16 VITALS — BP 153/81 | HR 98 | Temp 97.8°F | Wt 129.4 lb

## 2018-10-16 DIAGNOSIS — F5105 Insomnia due to other mental disorder: Secondary | ICD-10-CM | POA: Diagnosis not present

## 2018-10-16 DIAGNOSIS — F410 Panic disorder [episodic paroxysmal anxiety] without agoraphobia: Secondary | ICD-10-CM

## 2018-10-16 DIAGNOSIS — Z634 Disappearance and death of family member: Secondary | ICD-10-CM

## 2018-10-16 MED ORDER — MIRTAZAPINE 15 MG PO TABS: 15.0000 mg | ORAL_TABLET | Freq: Every day | ORAL | 0 refills | Status: DC

## 2018-10-16 MED ORDER — HYDROXYZINE HCL 25 MG PO TABS: 12.5000 mg | ORAL_TABLET | Freq: Three times a day (TID) | ORAL | 1 refills | Status: DC | PRN

## 2018-10-16 NOTE — Progress Notes (Signed)
BH MD OP Progress Note  10/16/2018 12:33 PM Heidi [name]  MRN:  [id number]  Chief Complaint: ' I am here for follow up." Chief Complaint    Follow-up; Medication Refill     HPI: Heidi [name] is a 62 year old Caucasian female, divorced, on SSD, lives in [place], has a history of panic attack, COPD, GERD, presented to clinic today for a follow-up visit.  Patient reports she continues to struggle with some anxiety, nervousness during the day.  She has been taking Xanax twice a day at least 3-4 times a week.  She is tolerating the Lexapro well which helps.  She reports she continues to struggle with with some restlessness at night with regards to her sleep.  She is currently on mirtazapine.  She however reports she continues to take Unisom at a lower dose of 50 mg.  Patient is interested in increasing her mirtazapine today.  Will increase it to 15 mg.  Also discussed adding hydroxyzine as needed for anxiety during the day and also sleep at night.  However discussed with patient to discontinue using Unisom.  Patient reports she has stopped going for psychotherapy sessions since she feels she has learned to make use of her own coping techniques.  She will continue to work on herself at home.  She looks forward to the holidays and plans to spend it with family.  Patient denies any suicidality.  Patient denies any perceptual disturbances. Visit Diagnosis:    ICD-10-CM   1. 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 Bereavement Z63.4     Past Psychiatric History: Reviewed past psychiatric history from my progress note on 08/20/2018.  Past Medical History:  Past Medical History:  Diagnosis Date  . Anxiety   . COPD (chronic obstructive pulmonary disease) (HCC)   . GERD (gastroesophageal reflux disease)   . Hypotension    limiting med titration  . NSTEMI (non-ST elevated myocardial infarction) [facility]) 2013   12/2011 with normal coronaries possibly secondary  to Takotsubo (EF 30-35% by echo), occurred following two episodes of acute respiratory distress  . Pneumonia 11/2016   history of  . 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 ABDOMINAL HYSTERECTOMY    . CARDIAC CATHETERIZATION    . CHOLECYSTECTOMY N/A 06/05/2017   Procedure: LAPAROSCOPIC CHOLECYSTECTOMY;  Surgeon: Nadeen Landau, MD;  Location: [facility] ORS;  Service: General;  Laterality: N/A;  . FRACTURE SURGERY Left 07/2016   leg compound fraction  . LEFT HEART CATHETERIZATION WITH CORONARY ANGIOGRAM N/A 01/23/2012   Procedure: LEFT HEART CATHETERIZATION WITH CORONARY ANGIOGRAM;  Surgeon: Dolores Patty, MD;  Location: [facility] CATH LAB;  Service: Cardiovascular;  Laterality: N/A;  . vagina rectal repair     after childbirth    Family Psychiatric History: Reviewed family psychiatric history from my progress note on 08/20/2018.  Family History:  Family History  Problem Relation Age of Onset  . Heart attack Mother        deceased from massive MI at age 62  . Liver disease Father        fatty liver; living, age 71  . Stroke Brother        at age 84, living   . Hypertension Brother        living, age 37    Social History: Reviewed social history from my progress note on 08/20/2018. Social History   Socioeconomic History  . Marital status: Divorced  Spouse name: Not on file  . Number of children: 2  . Years of education: Not on file  . Highest education level: High school graduate  Occupational History  . 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 Financial resource strain: Not hard at all  . Food insecurity:    Worry: Never true    Inability: Never true  . Transportation needs:    Medical: No    Non-medical: No  Tobacco Use  . Smoking status: Former Smoker    Packs/day: 1.00    Years: 30.00    Pack years: 30.00    Types: Cigarettes    Last attempt to quit: 01/21/2012    Years since  quitting: 6.7  . Smokeless tobacco: Never Used  Substance and Sexual Activity  . Alcohol use: Yes    Comment: 1-2 beers per week  . Drug use: No  . Sexual activity: Yes    Birth control/protection: Surgical  Lifestyle  . Physical activity:    Days per week: 0 days    Minutes per session: 0 min  . Stress: Rather much  Relationships  . Social connections:    Talks on phone: Not on file    Gets together: Not on file    Attends religious service: Never    Active member of club or organization: Yes    Attends meetings of clubs or organizations: More than 4 times per year    Relationship status: Divorced  Other Topics Concern  . Not on file  Social History Narrative   Ms. 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 She has two children, a daughter and son, who live in [place] area. She lives with her brother.    Allergies:  Allergies  Allergen Reactions  . Prozac [Fluoxetine] Itching  . Buspar [Buspirone] Other (See Comments)    Ache joints  . Codeine Nausea Only  . Levaquin [Levofloxacin In D5w] Hives  . Propranolol Other (See Comments)    dizziness  . Zoloft [Sertraline Hcl] Other (See Comments)    Increased anxiety    Metabolic Disorder Labs: Lab Results  Component Value Date   HGBA1C 5.6 01/23/2012   MPG 114 01/23/2012   No results found for: PROLACTIN Lab Results  Component Value Date   CHOL 134 01/24/2012   TRIG 65 01/24/2012   HDL 80 01/24/2012   CHOLHDL 1.7 01/24/2012   VLDL 13 01/24/2012   LDLCALC 41 01/24/2012   LDLCALC 54 07/12/2009   Lab Results  Component Value Date   TSH 0.331 (L) 01/23/2012   TSH 0.625 07/12/2009    Therapeutic Level Labs: No results found for: LITHIUM No results found for: VALPROATE No components found for:  CBMZ  Current Medications: Current Outpatient Medications  Medication Sig Dispense Refill  . acetaminophen (TYLENOL) 325 MG tablet Take 650 mg by mouth every 6 (six) hours as needed for mild pain  or moderate pain.    Marland Kitchen. albuterol (PROVENTIL HFA;VENTOLIN HFA) 108 (90 Base) MCG/ACT inhaler Inhale 1-2 puffs into the lungs every 4 (four) hours as needed for wheezing or shortness of breath.    Marland Kitchen. albuterol (PROVENTIL) (2.5 MG/3ML) 0.083% nebulizer solution Take 3 mLs (2.5 mg total) by nebulization every 6 (six) hours as needed for wheezing or shortness of breath. 360 mL 11  . ALPRAZolam (XANAX) 0.25 MG tablet Take 0.25 mg by mouth 2 (two) times daily.     .Marland Kitchen  escitalopram (LEXAPRO) 20 MG tablet Take 1 tablet (20 mg total) by mouth daily. 90 tablet 0  . fluticasone (FLONASE) 50 MCG/ACT nasal spray USE 2 SPRAYS IN EACH NOSTRIL ONCE DAILY 16 g 5  . fluticasone (FLONASE) 50 MCG/ACT nasal spray Place 2 sprays into both nostrils daily. 16 g 5  . hydrOXYzine (ATARAX/VISTARIL) 25 MG tablet Take 0.5-1 tablets (12.5-25 mg total) by mouth 3 (three) times daily as needed. For anxiety as well as sleep 90 tablet 1  . Ipratropium-Albuterol (COMBIVENT RESPIMAT) 20-100 MCG/ACT AERS respimat INHALE ONE PUFF BY MOUTH EVERY 6 HOURS AS NEEDED FOR WHEEZING 1 Inhaler 5  . mirtazapine (REMERON) 15 MG tablet Take 1 tablet (15 mg total) by mouth at bedtime. 90 tablet 0  . OXYGEN Inhale 2 L into the lungs as needed.    . pantoprazole (PROTONIX) 40 MG tablet Take 40 mg by mouth daily. Take 30-60 min before first meal of the day     . ranitidine (ZANTAC) 150 MG tablet     . TRELEGY ELLIPTA 100-62.5-25 MCG/INH AEPB INHALE 1 PUFF ONCE DAILY 180 each 0   No current facility-administered medications for this visit.      Musculoskeletal: Strength & Muscle Tone: within normal limits Gait & Station: normal Patient leans: N/A  Psychiatric Specialty Exam: Review of Systems  Psychiatric/Behavioral: The patient is nervous/anxious and has insomnia.   All other systems reviewed and are negative.   Blood pressure (!) 153/81, pulse 98, temperature 97.8 F (36.6 C), temperature source Oral, weight 129 lb 6.4 oz (58.7 kg).Body mass  index is 20.89 kg/m.  General Appearance: Casual  Eye Contact:  Fair  Speech:  Clear and Coherent  Volume:  Normal  Mood:  Anxious  Affect:  Appropriate  Thought Process:  Goal Directed and Descriptions of Associations: Intact  Orientation:  Full (Time, Place, and Person)  Thought Content: Logical   Suicidal Thoughts:  No  Homicidal Thoughts:  No  Memory:  Immediate;   Fair Recent;   Fair Remote;   Fair  Judgement:  Fair  Insight:  Fair  Psychomotor Activity:  Normal  Concentration:  Concentration: Fair and Attention Span: Fair  Recall:  [employer] of Knowledge: Fair  Language: Fair  Akathisia:  No  Handed:  Right  AIMS (if indicated): denies tremors, rigidity  Assets:  Communication Skills Desire for Improvement Social Support  ADL's:  Intact  Cognition: WNL  Sleep:  restless   Screenings: PHQ2-9     Pulmonary Rehab from 05/26/2016 in [facility] Cardiac [facility] Pulmonary Rehab Pulmonary Rehab from 02/15/2016 in [facility] Cardiac [facility] Pulmonary Rehab  PHQ-2 Total Score  1  0  PHQ-9 Total Score  5  3       Assessment and Plan: Heidi [name] is a 62 year old Caucasian female on disability, lives in [place], history of anxiety, insomnia, COPD, GERD, presented to the clinic today for a follow-up visit.  Patient is biologically predisposed given her medical problems, family history of mental health problems.  She also has psychosocial stressors including her own health issues, relationship stressors as well as her son having legal issues.  Patient continues to have anxiety symptoms as well as sleep problems and hence will benefit from medication readjustment.  Plan Panic disorder Continue Lexapro 20 mg p.o. daily. Increase Remeron to 15 mg p.o. nightly Patient continues to be on Xanax 0.5 mg p.o. twice daily as needed up to 4 times a week.  Discussed benzodiazepine risks and side effects and the  need to limit use. Will add hydroxyzine 12.5 to 25 mg p.o. 3 times daily as needed for anxiety  symptoms.  For bereavement Patient reports she is coping better.  For insomnia Increase mirtazapine to 15 mg p.o. nightly Start hydroxyzine 25 mg as needed.  Follow-up in clinic in 4 weeks or sooner if needed.  More than 50 % of the time was spent for psychoeducation and supportive psychotherapy and care coordination.  This note was generated in part or whole with voice recognition software. Voice recognition is usually quite accurate but there are transcription errors that can and very often do occur. I apologize for any typographical errors that were not detected and corrected.        Jomarie Longs, MD 10/16/2018, 12:33 PM

## 2018-10-16 NOTE — Patient Instructions (Signed)
Hydroxyzine capsules or tablets What is this medicine? HYDROXYZINE (hye DROX i zeen) is an antihistamine. This medicine is used to treat allergy symptoms. It is also used to treat anxiety and tension. This medicine can be used with other medicines to induce sleep before surgery. This medicine may be used for other purposes; ask your health care provider or pharmacist if you have questions. COMMON BRAND NAME(S): ANX, Atarax, Rezine, Vistaril What should I tell my health care provider before I take this medicine? They need to know if you have any of these conditions: -glaucoma -heart disease -history of irregular heartbeat -kidney disease -liver disease -lung or breathing disease, like asthma -stomach or intestine problems -thyroid disease -trouble passing urine -an unusual or allergic reaction to hydroxyzine, cetirizine, other medicines, foods, dyes or preservatives -pregnant or trying to get pregnant -breast-feeding How should I use this medicine? Take this medicine by mouth with a full glass of water. Follow the directions on the prescription label. You may take this medicine with food or on an empty stomach. Take your medicine at regular intervals. Do not take your medicine more often than directed. Talk to your pediatrician regarding the use of this medicine in children. Special care may be needed. While this drug may be prescribed for children as young as 6 years of age for selected conditions, precautions do apply. Patients over 65 years old may have a stronger reaction and need a smaller dose. Overdosage: If you think you have taken too much of this medicine contact a poison control center or emergency room at once. NOTE: This medicine is only for you. Do not share this medicine with others. What if I miss a dose? If you miss a dose, take it as soon as you can. If it is almost time for your next dose, take only that dose. Do not take double or extra doses. What may interact with this  medicine? Do not take this medicine with any of the following medications: -cisapride -dofetilide -dronedarone -pimozide -thioridazine This medicine may also interact with the following medications: -alcohol -antihistamines for allergy, cough, and cold -atropine -barbiturate medicines for sleep or seizures, like phenobarbital -certain antibiotics like erythromycin or clarithromycin -certain medicines for anxiety or sleep -certain medicines for bladder problems like oxybutynin, tolterodine -certain medicines for depression or psychotic disturbances -certain medicines for irregular heart beat -certain medicines for Parkinson's disease like benztropine, trihexyphenidyl -certain medicines for seizures like phenobarbital, primidone -certain medicines for stomach problems like dicyclomine, hyoscyamine -certain medicines for travel sickness like scopolamine -ipratropium -narcotic medicines for pain -other medicines that prolong the QT interval (an abnormal heart rhythm) This list may not describe all possible interactions. Give your health care provider a list of all the medicines, herbs, non-prescription drugs, or dietary supplements you use. Also tell them if you smoke, drink alcohol, or use illegal drugs. Some items may interact with your medicine. What should I watch for while using this medicine? Tell your doctor or health care professional if your symptoms do not improve. You may get drowsy or dizzy. Do not drive, use machinery, or do anything that needs mental alertness until you know how this medicine affects you. Do not stand or sit up quickly, especially if you are an older patient. This reduces the risk of dizzy or fainting spells. Alcohol may interfere with the effect of this medicine. Avoid alcoholic drinks. Your mouth may get dry. Chewing sugarless gum or sucking hard candy, and drinking plenty of water may help. Contact your doctor if   the problem does not go away or is  severe. This medicine may cause dry eyes and blurred vision. If you wear contact lenses you may feel some discomfort. Lubricating drops may help. See your eye doctor if the problem does not go away or is severe. If you are receiving skin tests for allergies, tell your doctor you are using this medicine. What side effects may I notice from receiving this medicine? Side effects that you should report to your doctor or health care professional as soon as possible: -allergic reactions like skin rash, itching or hives, swelling of the face, lips, or tongue -changes in vision -confusion -fast, irregular heartbeat -seizures -tremor -trouble passing urine or change in the amount of urine Side effects that usually do not require medical attention (report to your doctor or health care professional if they continue or are bothersome): -constipation -drowsiness -dry mouth -headache -tiredness This list may not describe all possible side effects. Call your doctor for medical advice about side effects. You may report side effects to FDA at 1-800-FDA-1088. Where should I keep my medicine? Keep out of the reach of children. Store at room temperature between 15 and 30 degrees C (59 and 86 degrees F). Keep container tightly closed. Throw away any unused medicine after the expiration date. NOTE: This sheet is a summary. It may not cover all possible information. If you have questions about this medicine, talk to your doctor, pharmacist, or health care provider.  2019 Elsevier/Gold Standard (2018-04-29 13:25:13)  

## 2018-11-10 ENCOUNTER — Other Ambulatory Visit: Payer: Self-pay | Admitting: Psychiatry

## 2018-11-10 ENCOUNTER — Other Ambulatory Visit: Payer: Self-pay | Admitting: Pulmonary Disease

## 2018-11-10 DIAGNOSIS — J432 Centrilobular emphysema: Secondary | ICD-10-CM

## 2018-11-14 ENCOUNTER — Ambulatory Visit (INDEPENDENT_AMBULATORY_CARE_PROVIDER_SITE_OTHER): Payer: Medicare Other | Admitting: Psychiatry

## 2018-11-14 ENCOUNTER — Encounter: Payer: Self-pay | Admitting: Psychiatry

## 2018-11-14 ENCOUNTER — Other Ambulatory Visit: Payer: Self-pay

## 2018-11-14 VITALS — BP 152/82 | HR 102 | Temp 98.1°F | Wt 129.2 lb

## 2018-11-14 DIAGNOSIS — F410 Panic disorder [episodic paroxysmal anxiety] without agoraphobia: Secondary | ICD-10-CM | POA: Diagnosis not present

## 2018-11-14 DIAGNOSIS — F5105 Insomnia due to other mental disorder: Secondary | ICD-10-CM

## 2018-11-14 DIAGNOSIS — Z634 Disappearance and death of family member: Secondary | ICD-10-CM | POA: Diagnosis not present

## 2018-11-14 MED ORDER — ESCITALOPRAM OXALATE 20 MG PO TABS: 20.0000 mg | ORAL_TABLET | Freq: Every day | ORAL | 0 refills | Status: DC

## 2018-11-14 NOTE — Progress Notes (Signed)
BH MD OP Progress Note  11/14/2018 3:22 PM Heidi [name]  MRN:  [id number]  Chief Complaint:  Chief Complaint    Follow-up; Medication Refill    ' I am here for follow up.'  HPI: Heidi [name] is a 63 year old Caucasian female, divorced, on SSD, lives in [place], has a history of panic attack, COPD, GERD, presented to the clinic today for a follow-up visit.  Patient today reports that she is making some progress on the current medication regimen.  She reports she has been able to cut down on her soft drinks intake.  This has actually helped her to feel better.  She reports that she also has been trying to cut down on the Xanax.  She is currently on Xanax 0.25 mg daily.  She reports she is trying to skip a day here and there.  She has been taking hydroxyzine more often when she feels anxious.  She reports sleep is improved on the mirtazapine 15 mg.  Her dosage was increased last visit.  Patient denies any suicidality.  Patient denies any perceptual disturbances.  Patient denies any other concerns today.  Visit Diagnosis:    ICD-10-CM   1. 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 Bereavement Z63.4     Past Psychiatric History: I have reviewed past psychiatric history from my progress note on 08/20/2018  Past Medical History:  Past Medical History:  Diagnosis Date  . Anxiety   . COPD (chronic obstructive pulmonary disease) (HCC)   . GERD (gastroesophageal reflux disease)   . Hypotension    limiting med titration  . NSTEMI (non-ST elevated myocardial infarction) [facility](HCC) 2013   12/2011 with normal coronaries possibly secondary to Takotsubo (EF 30-35% by echo), occurred following two episodes of acute respiratory distress  . Pneumonia 11/2016   history of  . 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 ABDOMINAL HYSTERECTOMY    . CARDIAC CATHETERIZATION    . CHOLECYSTECTOMY N/A 06/05/2017   Procedure: LAPAROSCOPIC  CHOLECYSTECTOMY;  Surgeon: Nadeen LandauSmith, Jarvis Wilton, MD;  Location: [facility] ORS;  Service: General;  Laterality: N/A;  . FRACTURE SURGERY Left 07/2016   leg compound fraction  . LEFT HEART CATHETERIZATION WITH CORONARY ANGIOGRAM N/A 01/23/2012   Procedure: LEFT HEART CATHETERIZATION WITH CORONARY ANGIOGRAM;  Surgeon: Dolores Pattyaniel R Bensimhon, MD;  [facility];  Service: Cardiovascular;  Laterality: N/A;  . vagina rectal repair     after childbirth    Family Psychiatric History: Reviewed family psychiatric history from my progress note on 08/20/2018.  Family History:  Family History  Problem Relation Age of Onset  . Heart attack Mother        deceased from massive MI at age 63  . Liver disease Father        fatty liver; living, age 63  . Stroke Brother        at age 63, living   . Hypertension Brother        living, age 63    Social History: Reviewed social history from my progress note on 08/20/2018. Social History   Socioeconomic History  . Marital status: Divorced    Spouse name: Not on file  . Number of children: 2  . Years of education: Not on file  . Highest education level: High school graduate  Occupational History  . Occupation: Airline pilotretail    Employer: [employer]    Comment: Old [employer]  Social  Needs  . Financial resource strain: Not hard at all  . Food insecurity:    Worry: Never true    Inability: Never true  . Transportation needs:    Medical: No    Non-medical: No  Tobacco Use  . Smoking status: Former Smoker    Packs/day: 1.00    Years: 30.00    Pack years: 30.00    Types: Cigarettes    Last attempt to quit: 01/21/2012    Years since quitting: 6.8  . Smokeless tobacco: Never Used  Substance and Sexual Activity  . Alcohol use: Yes    Comment: 1-2 beers per week  . Drug use: No  . Sexual activity: Yes    Birth control/protection: Surgical  Lifestyle  . Physical activity:    Days per week: 0 days    Minutes per session: 0 min  . Stress: Rather much   Relationships  . Social connections:    Talks on phone: Not on file    Gets together: Not on file    Attends religious service: Never    Active member of club or organization: Yes    Attends meetings of clubs or organizations: More than 4 times per year    Relationship status: Divorced  Other Topics Concern  . 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 She has two children, a daughter and son, who live in [place] area. She lives with her brother.    Allergies:  Allergies  Allergen Reactions  . Prozac [Fluoxetine] Itching  . Buspar [Buspirone] Other (See Comments)    Ache joints  . Codeine Nausea Only  . Levaquin [Levofloxacin In D5w] Hives  . Propranolol Other (See Comments)    dizziness  . Zoloft [Sertraline Hcl] Other (See Comments)    Increased anxiety    Metabolic Disorder Labs: Lab Results  Component Value Date   HGBA1C 5.6 01/23/2012   MPG 114 01/23/2012   No results found for: PROLACTIN Lab Results  Component Value Date   CHOL 134 01/24/2012   TRIG 65 01/24/2012   HDL 80 01/24/2012   CHOLHDL 1.7 01/24/2012   VLDL 13 01/24/2012   LDLCALC 41 01/24/2012   LDLCALC 54 07/12/2009   Lab Results  Component Value Date   TSH 0.331 (L) 01/23/2012   TSH 0.625 07/12/2009    Therapeutic Level Labs: No results found for: LITHIUM No results found for: VALPROATE No components found for:  CBMZ  Current Medications: Current Outpatient Medications  Medication Sig Dispense Refill  . acetaminophen (TYLENOL) 325 MG tablet Take 650 mg by mouth every 6 (six) hours as needed for mild pain or moderate pain.    Marland Kitchen albuterol (PROVENTIL HFA;VENTOLIN HFA) 108 (90 Base) MCG/ACT inhaler Inhale 1-2 puffs into the lungs every 4 (four) hours as needed for wheezing or shortness of breath.    Marland Kitchen albuterol (PROVENTIL) (2.5 MG/3ML) 0.083% nebulizer solution Take 3 mLs (2.5 mg total) by nebulization every 6 (six) hours as  needed for wheezing or shortness of breath. 360 mL 11  . ALPRAZolam (XANAX) 0.25 MG tablet Take 0.25 mg by mouth 2 (two) times daily.     Marland Kitchen escitalopram (LEXAPRO) 20 MG tablet Take 1 tablet (20 mg total) by mouth daily. 90 tablet 0  . fluticasone (FLONASE) 50 MCG/ACT nasal spray USE 2 SPRAYS IN EACH NOSTRIL ONCE DAILY 16 g 5  . fluticasone (FLONASE) 50 MCG/ACT nasal spray Place 2 sprays  into both nostrils daily. 16 g 5  . hydrOXYzine (ATARAX/VISTARIL) 25 MG tablet Take 0.5-1 tablets (12.5-25 mg total) by mouth 3 (three) times daily as needed. For anxiety as well as sleep 90 tablet 1  . Ipratropium-Albuterol (COMBIVENT RESPIMAT) 20-100 MCG/ACT AERS respimat INHALE ONE PUFF BY MOUTH EVERY 6 HOURS AS NEEDED FOR WHEEZING 1 Inhaler 5  . mirtazapine (REMERON) 15 MG tablet TAKE 1 TABLET BY MOUTH AT BEDTIME 90 tablet 0  . OXYGEN Inhale 2 L into the lungs as needed.    . pantoprazole (PROTONIX) 40 MG tablet Take 40 mg by mouth daily. Take 30-60 min before first meal of the day     . ranitidine (ZANTAC) 150 MG tablet     . TRELEGY ELLIPTA 100-62.5-25 MCG/INH AEPB INHALE 1 PUFF ONCE DAILY 180 each 0   No current facility-administered medications for this visit.      Musculoskeletal: Strength & Muscle Tone: within normal limits Gait & Station: normal Patient leans: N/A  Psychiatric Specialty Exam: Review of Systems  Psychiatric/Behavioral: The patient is nervous/anxious.   All other systems reviewed and are negative.   Blood pressure (!) 152/82, pulse (!) 102, temperature 98.1 F (36.7 C), temperature source Oral, weight 129 lb 3.2 oz (58.6 kg).Body mass index is 20.85 kg/m.  General Appearance: Casual  Eye Contact:  Fair  Speech:  Clear and Coherent  Volume:  Normal  Mood:  Anxious  Affect:  Appropriate  Thought Process:  Goal Directed and Descriptions of Associations: Intact  Orientation:  Full (Time, Place, and Person)  Thought Content: Logical   Suicidal Thoughts:  No  Homicidal  Thoughts:  No  Memory:  Immediate;   Fair Recent;   Fair Remote;   Fair  Judgement:  Fair  Insight:  Fair  Psychomotor Activity:  Normal  Concentration:  Concentration: Fair and Attention Span: Fair  Recall:  [employer] of Knowledge: Fair  Language: Fair  Akathisia:  No  Handed:  Right  AIMS (if indicated): denies tremors, rigidity,stiffness  Assets:  Communication Skills Desire for Improvement Social Support  ADL's:  Intact  Cognition: WNL  Sleep:  improving   Screenings: PHQ2-9     Pulmonary Rehab from 05/26/2016 in [facility] Pulmonary Rehab from 02/15/2016 in [facility] Cardiac and Pulmonary Rehab  PHQ-2 Total Score  1  0  PHQ-9 Total Score  5  3       Assessment and Plan: Shirlyn is a 63 year old Caucasian female, on disability, lives in [place], has a history of anxiety, insomnia, COPD, GERD, presented to the clinic today for a follow-up visit.  Patient is biologically predisposed given her medical problems, family history of mental health problems.  She also has psychosocial stressors including her own health issues, relationship stressors as well as her son having legal issues.  Patient today reports she is compliant with her medications and making progress.  Plan Panic disorder-improving Lexapro 20 mg p.o. daily Remeron 15 mg p.o. nightly Patient is currently on Xanax 0.25 mg.  Discussed with patient to continue to cut down Xanax.  She reports she has been trying to take 0.25 mg once a day and trying to skip at least a day here and there.  Discussed with patient to skip the Xanax once a week and replace it with hydroxyzine as needed. Continue hydroxyzine 25 mg 3 times a day as needed. I have reviewed [place] controlled substance database.  Patient received a prescription from Dr. Leotis Shames on 10/16/2018  for Xanax 0.25 mg-# 60 pills.  Bereavement-improving Patient is coping better.  For insomnia-improving Mirtazapine 15 mg p.o.  nightly Hydroxyzine 25 mg as needed.  Follow-up in clinic in 2 months or sooner if needed.  I have spent atleast 15 minutes  face to face with patient today. More than 50 % of the time was spent for psychoeducation and supportive psychotherapy and care coordination.  This note was generated in part or whole with voice recognition software. Voice recognition is usually quite accurate but there are transcription errors that can and very often do occur. I apologize for any typographical errors that were not detected and corrected.          Jomarie Longs, MD 11/14/2018, 3:22 PM

## 2018-11-21 NOTE — Telephone Encounter (Signed)
Hey Dr Kendrick FriesMcQuaid  This pt was started on 25 mg hydroxyzine for her anxiety  She asks if this will have any effect on her breathing  Please advise thanks

## 2018-11-25 ENCOUNTER — Other Ambulatory Visit: Payer: Self-pay | Admitting: Internal Medicine

## 2018-11-25 DIAGNOSIS — Z1231 Encounter for screening mammogram for malignant neoplasm of breast: Secondary | ICD-10-CM

## 2018-12-01 ENCOUNTER — Other Ambulatory Visit: Payer: Self-pay | Admitting: Pulmonary Disease

## 2018-12-25 ENCOUNTER — Other Ambulatory Visit: Payer: Self-pay | Admitting: Pulmonary Disease

## 2019-01-09 ENCOUNTER — Ambulatory Visit: Payer: Medicare Other | Admitting: Psychiatry

## 2019-02-10 ENCOUNTER — Ambulatory Visit: Payer: Medicare Other | Admitting: Pulmonary Disease

## 2019-02-26 ENCOUNTER — Ambulatory Visit: Payer: Medicare Other | Admitting: Pulmonary Disease

## 2019-03-02 ENCOUNTER — Other Ambulatory Visit: Payer: Self-pay | Admitting: Psychiatry

## 2019-03-20 ENCOUNTER — Other Ambulatory Visit: Payer: Self-pay | Admitting: Pulmonary Disease

## 2019-03-20 DIAGNOSIS — J432 Centrilobular emphysema: Secondary | ICD-10-CM

## 2019-04-03 ENCOUNTER — Other Ambulatory Visit: Payer: Self-pay | Admitting: Pulmonary Disease

## 2019-04-03 DIAGNOSIS — J432 Centrilobular emphysema: Secondary | ICD-10-CM

## 2019-04-04 ENCOUNTER — Other Ambulatory Visit: Payer: Self-pay | Admitting: Pulmonary Disease

## 2019-04-04 ENCOUNTER — Ambulatory Visit: Payer: Medicare Other | Attending: Pulmonary Disease

## 2019-04-04 ENCOUNTER — Other Ambulatory Visit: Payer: Self-pay

## 2019-04-04 DIAGNOSIS — J432 Centrilobular emphysema: Secondary | ICD-10-CM

## 2019-04-04 LAB — BLOOD GAS, ARTERIAL
Acid-Base Excess: 4.9 mmol/L
Bicarbonate: 29.2 mmol/L
FIO2: 0.21
O2 Saturation: 97.5 %
Patient temperature: 37
pCO2 arterial: 41 mmHg
pH, Arterial: 7.46
pO2, Arterial: 74 mmHg

## 2019-04-25 ENCOUNTER — Other Ambulatory Visit: Payer: Self-pay | Admitting: Pulmonary Disease

## 2019-04-30 ENCOUNTER — Telehealth: Payer: Self-pay | Admitting: *Deleted

## 2019-04-30 NOTE — Telephone Encounter (Signed)
Left message for patient to notify them that it is time to schedule annual low dose lung cancer screening CT scan. Instructed patient to call back to verify information prior to the scan being scheduled.  

## 2019-05-01 ENCOUNTER — Telehealth: Payer: Self-pay | Admitting: *Deleted

## 2019-05-01 DIAGNOSIS — Z122 Encounter for screening for malignant neoplasm of respiratory organs: Secondary | ICD-10-CM

## 2019-05-01 DIAGNOSIS — Z87891 Personal history of nicotine dependence: Secondary | ICD-10-CM

## 2019-05-01 NOTE — Telephone Encounter (Signed)
Patient has been notified that annual lung cancer screening low dose CT scan is due currently or will be in near future. Confirmed that patient is within the age range of 55-77, and asymptomatic, (no signs or symptoms of lung cancer). Patient denies illness that would prevent curative treatment for lung cancer if found. Verified smoking history, (former, quit 01/21/12, 40 pack year). The shared decision making visit was done 07/31/14. Patient is agreeable for CT scan being scheduled.

## 2019-05-07 ENCOUNTER — Ambulatory Visit
Admission: RE | Admit: 2019-05-07 | Discharge: 2019-05-07 | Disposition: A | Discharge status: Home / Self Care | Payer: Medicare Other | Source: Ambulatory Visit | Attending: Oncology | Admitting: Oncology

## 2019-05-07 ENCOUNTER — Other Ambulatory Visit: Payer: Self-pay

## 2019-05-07 DIAGNOSIS — Z122 Encounter for screening for malignant neoplasm of respiratory organs: Secondary | ICD-10-CM | POA: Diagnosis present

## 2019-05-07 DIAGNOSIS — Z87891 Personal history of nicotine dependence: Secondary | ICD-10-CM | POA: Diagnosis present

## 2019-05-09 ENCOUNTER — Encounter: Payer: Self-pay | Admitting: *Deleted

## 2019-07-02 ENCOUNTER — Telehealth: Payer: Self-pay | Admitting: Pulmonary Disease

## 2019-07-02 MED ORDER — COMBIVENT RESPIMAT 20-100 MCG/ACT IN AERS: INHALATION_SPRAY | RESPIRATORY_TRACT | 0 refills | Status: DC

## 2019-07-02 NOTE — Telephone Encounter (Signed)
No appointment has been scheduled.  Will leave encounter open for follow up

## 2019-07-02 NOTE — Telephone Encounter (Signed)
I spoke with pt and reminded her that she would need a follow up but I would refill her Combivent once more. We attempted to make the appt over the phone but she wanted to call back to schedule an appt after she checked her calendar. Will await a return call from pt. She agreed to see a NP since BQ will not be returning to the office.

## 2019-07-04 NOTE — Telephone Encounter (Signed)
Spoke with pt. She has been scheduled on 07/10/2019 at 1200. Nothing further was needed.

## 2019-07-10 ENCOUNTER — Encounter: Payer: Self-pay | Admitting: Adult Health

## 2019-07-10 ENCOUNTER — Ambulatory Visit (INDEPENDENT_AMBULATORY_CARE_PROVIDER_SITE_OTHER): Payer: Medicare Other | Admitting: Adult Health

## 2019-07-10 ENCOUNTER — Other Ambulatory Visit: Payer: Self-pay

## 2019-07-10 DIAGNOSIS — J432 Centrilobular emphysema: Secondary | ICD-10-CM

## 2019-07-10 DIAGNOSIS — J9611 Chronic respiratory failure with hypoxia: Secondary | ICD-10-CM | POA: Diagnosis not present

## 2019-07-10 MED ORDER — ALBUTEROL SULFATE (2.5 MG/3ML) 0.083% IN NEBU: 2.5000 mg | INHALATION_SOLUTION | Freq: Four times a day (QID) | RESPIRATORY_TRACT | 5 refills | Status: AC | PRN

## 2019-07-10 MED ORDER — TRELEGY ELLIPTA 100-62.5-25 MCG/INH IN AEPB: 1.0000 | INHALATION_SPRAY | Freq: Every day | RESPIRATORY_TRACT | 5 refills | Status: DC

## 2019-07-10 MED ORDER — COMBIVENT RESPIMAT 20-100 MCG/ACT IN AERS: INHALATION_SPRAY | RESPIRATORY_TRACT | 5 refills | Status: AC

## 2019-07-10 NOTE — Patient Instructions (Signed)
Continue on Trelegy 1 puff daily , rinse after use.  Continue on Oxygen 2l/m At bedtime   And As needed   LDCT chest screening July 2021 as planned .  Follow up with  In 6 months and As needed   Please contact office for sooner follow up if symptoms do not improve or worsen or seek emergency care

## 2019-07-10 NOTE — Assessment & Plan Note (Signed)
Cont on Oxygen with act and At bedtime    Plan  Patient Instructions  Continue on Trelegy 1 puff daily , rinse after use.  Continue on Oxygen 2l/m At bedtime   And As needed   LDCT chest screening July 2021 as planned .  Follow up with  In 6 months and As needed   Please contact office for sooner follow up if symptoms do not improve or worsen or seek emergency care

## 2019-07-10 NOTE — Assessment & Plan Note (Addendum)
Currently stable on present regimen  Plan  Patient Instructions  Continue on Trelegy 1 puff daily , rinse after use.  Continue on Oxygen 2l/m At bedtime   And As needed   LDCT chest screening July 2021 as planned .  Follow up with  In 6 months and As needed   Please contact office for sooner follow up if symptoms do not improve or worsen or seek emergency care

## 2019-07-10 NOTE — Progress Notes (Signed)
@Patient  ID: Heidi [name], female    DOB: 1956/07/20, 63 y.o.   MRN: [id number]  Chief Complaint  Patient presents with   Follow-up    COPD    Referring provider: Patrice Paradise, MD  HPI: 63 year old female former smoker (2013)  followed for very severe COPD, emphysema and chronic hypoxic respiratory failure on oxygen   TEST/EVENTS :  2013 FEV1 0.72 L (27% predicted) 2018 spiro: FEV 1 0.8L (29% pred)   Chest imaging: May 2018 CT chest images and apparently reviewed showing moderate to severe centrilobular emphysema bilaterally, there is a small lobulated nodule adjacent to the pleura in the right upper lobe, approximately 80mm in size October 2018 chest x-ray images independently reviewed showing emphysema but no infiltrate June 2019 CT chest showed resolution of the nodules,  Completed pulmonary rehab x 2 .    07/10/2019 Follow up : COPD , Emphysema , O2 RF  Patient presents for a follow-up.  Last seen December 2019.  Patient has underlying very severe COPD and emphysema.  She remains on Trelegy daily.  Uses Combivent and albuterol nebulizer as needed.  Says overall that breathing has been doing okay.  Denies a flare of cough or wheezing.  Has occasional dry cough and intermittent wheeze Shortness of breath is at baseline. Able to do light housework , has to rest often with making bed/sweeping/dishes.  Low-dose CT screening in May 08, 2019 was negative with lung RADS 1.  Has follow-up  LD CT July 2021. Flu shot is up-to-date. She remains on oxygen at 2l/m  with activity and At bedtime     Allergies  Allergen Reactions   Prozac [Fluoxetine] Itching   Buspar [Buspirone] Other (See Comments)    Ache joints   Codeine Nausea Only   Levaquin [Levofloxacin In D5w] Hives   Propranolol Other (See Comments)    dizziness   Zoloft [Sertraline Hcl] Other (See Comments)    Increased anxiety    Immunization History  Administered Date(s) Administered    Influenza Split 07/14/2013, 07/07/2014, 08/02/2015, 09/08/2016, 07/25/2017, 07/03/2019   Influenza Whole 09/07/2006, 09/02/2008, 08/02/2009, 08/22/2010, 08/30/2012   Influenza, High Dose Seasonal PF 07/19/2018   Pneumococcal Conjugate-13 08/13/2015   Pneumococcal Polysaccharide-23 07/02/2006, 01/25/2012   Td 07/31/2005   Tdap 07/31/2005, 05/17/2016, 12/29/2016    Past Medical History:  Diagnosis Date   Anxiety    COPD (chronic obstructive pulmonary disease) (HCC)    GERD (gastroesophageal reflux disease)    Hypotension    limiting med titration   NSTEMI (non-ST elevated myocardial infarction) (HCC) 2013   12/2011 with normal coronaries possibly secondary to Takotsubo (EF 30-35% by echo), occurred following two episodes of acute respiratory distress   Pneumonia 11/2016   history of   Takotsubo syndrome 4/13    Tobacco History: Social History   Tobacco Use  Smoking Status Former Smoker   Packs/day: 1.00   Years: 30.00   Pack years: 30.00   Types: Cigarettes   Quit date: 01/21/2012   Years since quitting: 7.4  Smokeless Tobacco Never Used   Counseling given: Not Answered   Outpatient Medications Prior to Visit  Medication Sig Dispense Refill   albuterol (PROVENTIL HFA;VENTOLIN HFA) 108 (90 Base) MCG/ACT inhaler Inhale 1-2 puffs into the lungs every 4 (four) hours as needed for wheezing or shortness of breath.     ALPRAZolam (XANAX) 0.25 MG tablet Take 0.25 mg by mouth 2 (two) times daily.      escitalopram (LEXAPRO) 20 MG tablet Take  1 tablet (20 mg total) by mouth daily. 90 tablet 0   fluticasone (FLONASE) 50 MCG/ACT nasal spray USE 2 SPRAYS IN EACH NOSTRIL ONCE DAILY 16 g 5   OXYGEN Inhale 2 L into the lungs as needed.     pantoprazole (PROTONIX) 40 MG tablet Take 40 mg by mouth daily. Take 30-60 min before first meal of the day      acetaminophen (TYLENOL) 325 MG tablet Take 650 mg by mouth every 6 (six) hours as needed for mild pain or moderate  pain.     albuterol (PROVENTIL) (2.5 MG/3ML) 0.083% nebulizer solution Take 3 mLs (2.5 mg total) by nebulization every 6 (six) hours as needed for wheezing or shortness of breath. 360 mL 11   fluticasone (FLONASE) 50 MCG/ACT nasal spray Use 2 spray(s) in each nostril once daily 48 g 0   hydrOXYzine (ATARAX/VISTARIL) 25 MG tablet TAKE 1/2 TO 1 (ONE-HALF TO ONE) TABLET BY MOUTH THREE TIMES DAILY AS NEEDED FOR ANXIETY AS  WELL  AS  SLEEP (Patient not taking: Reported on 07/10/2019) 90 tablet 0   Ipratropium-Albuterol (COMBIVENT RESPIMAT) 20-100 MCG/ACT AERS respimat INHALE 1 PUFF BY MOUTH EVERY 6 HOURS AS NEEDED FOR WHEEZING 4 g 0   mirtazapine (REMERON) 15 MG tablet TAKE 1 TABLET BY MOUTH AT BEDTIME (Patient not taking: Reported on 07/10/2019) 90 tablet 0   ranitidine (ZANTAC) 150 MG tablet      TRELEGY ELLIPTA 100-62.5-25 MCG/INH AEPB INHALE 1 PUFF ONCE DAILY 180 each 0   No facility-administered medications prior to visit.      Review of Systems:   Constitutional:   No  weight loss, night sweats,  Fevers, chills,  +fatigue, or  lassitude.  HEENT:   No headaches,  Difficulty swallowing,  Tooth/dental problems, or  Sore throat,                No sneezing, itching, ear ache, nasal congestion, post nasal drip,   CV:  No chest pain,  Orthopnea, PND, swelling in lower extremities, anasarca, dizziness, palpitations, syncope.   GI  No heartburn, indigestion, abdominal pain, nausea, vomiting, diarrhea, change in bowel habits, loss of appetite, bloody stools.   Resp: No shortness of breath with exertion or at rest.  No excess mucus, no productive cough,  No non-productive cough,  No coughing up of blood.  No change in color of mucus.  No wheezing.  No chest wall deformity  Skin: no rash or lesions.  GU: no dysuria, change in color of urine, no urgency or frequency.  No flank pain, no hematuria   MS:  No joint pain or swelling.  No decreased range of motion.  No back pain.    Physical  Exam  BP 126/64 (BP Location: Left Arm, Cuff Size: Normal)    Pulse 88    Temp (!) 97.3 F (36.3 C) (Temporal)    Ht 5\' 5"  (1.651 m)    Wt 126 lb (57.2 kg)    SpO2 95%    BMI 20.97 kg/m   GEN: A/Ox3; pleasant , NAD,elderly    HEENT:  [place]/AT,   l, NOSE-clear, THROAT-clear, no lesions, no postnasal drip or exudate noted.   NECK:  Supple w/ fair ROM; no JVD; normal carotid impulses w/o bruits; no thyromegaly or nodules palpated; no lymphadenopathy.    RESP decreased breath sounds in the bases no accessory muscle use, no dullness to percussion  CARD:  RRR, no m/r/g, no peripheral edema, pulses intact, no cyanosis or clubbing.  GI:   Soft & nt; nml bowel sounds; no organomegaly or masses detected.   Musco: Warm bil, no deformities or joint swelling noted.   Neuro: alert, no focal deficits noted.    Skin: Warm, no lesions or rashes    Lab Results:  CBC   Imaging: No results found.    No flowsheet data found.  No results found for: NITRICOXIDE      Assessment & Plan:   COPD (chronic obstructive pulmonary disease) (HCC) Currently stable on present regimen  Plan  Patient Instructions  Continue on Trelegy 1 puff daily , rinse after use.  Continue on Oxygen 2l/m At bedtime   And As needed   LDCT chest screening July 2021 as planned .  Follow up with  In 6 months and As needed   Please contact office for sooner follow up if symptoms do not improve or worsen or seek emergency care        Chronic respiratory failure (HCC) Cont on Oxygen with act and At bedtime    Plan  Patient Instructions  Continue on Trelegy 1 puff daily , rinse after use.  Continue on Oxygen 2l/m At bedtime   And As needed   LDCT chest screening July 2021 as planned .  Follow up with  In 6 months and As needed   Please contact office for sooner follow up if symptoms do not improve or worsen or seek emergency care           Rubye Oaksammy Kian Ottaviano, NP 07/10/2019

## 2019-07-10 NOTE — Progress Notes (Signed)
Reviewd, agree 

## 2019-11-19 ENCOUNTER — Other Ambulatory Visit: Payer: Self-pay | Admitting: Physician Assistant

## 2019-11-19 DIAGNOSIS — Z1231 Encounter for screening mammogram for malignant neoplasm of breast: Secondary | ICD-10-CM

## 2019-11-21 ENCOUNTER — Ambulatory Visit: Payer: Medicare Other | Attending: Internal Medicine

## 2019-11-21 DIAGNOSIS — Z20822 Contact with and (suspected) exposure to covid-19: Secondary | ICD-10-CM

## 2019-11-22 LAB — NOVEL CORONAVIRUS, NAA: SARS-CoV-2, NAA: NOT DETECTED

## 2020-01-13 ENCOUNTER — Other Ambulatory Visit: Payer: Self-pay

## 2020-01-13 ENCOUNTER — Emergency Department
Admission: EM | Admit: 2020-01-13 | Discharge: 2020-01-13 | Disposition: A | Discharge status: Home / Self Care | Payer: Medicare Other | Attending: Emergency Medicine | Admitting: Emergency Medicine

## 2020-01-13 ENCOUNTER — Emergency Department: Payer: Medicare Other

## 2020-01-13 DIAGNOSIS — Z87891 Personal history of nicotine dependence: Secondary | ICD-10-CM | POA: Diagnosis not present

## 2020-01-13 DIAGNOSIS — J449 Chronic obstructive pulmonary disease, unspecified: Secondary | ICD-10-CM | POA: Insufficient documentation

## 2020-01-13 DIAGNOSIS — Z79899 Other long term (current) drug therapy: Secondary | ICD-10-CM | POA: Insufficient documentation

## 2020-01-13 DIAGNOSIS — R0602 Shortness of breath: Secondary | ICD-10-CM | POA: Diagnosis present

## 2020-01-13 DIAGNOSIS — I252 Old myocardial infarction: Secondary | ICD-10-CM | POA: Insufficient documentation

## 2020-01-13 DIAGNOSIS — J181 Lobar pneumonia, unspecified organism: Secondary | ICD-10-CM | POA: Diagnosis not present

## 2020-01-13 DIAGNOSIS — J189 Pneumonia, unspecified organism: Secondary | ICD-10-CM

## 2020-01-13 LAB — BASIC METABOLIC PANEL
Anion gap: 6 (ref 5–15)
BUN: 9 mg/dL (ref 8–23)
CO2: 31 mmol/L (ref 22–32)
Calcium: 8.9 mg/dL (ref 8.9–10.3)
Chloride: 102 mmol/L (ref 98–111)
Creatinine, Ser: 0.67 mg/dL (ref 0.44–1.00)
GFR calc Af Amer: >60 mL/min (ref >60)
GFR calc non Af Amer: >60 mL/min (ref >60)
Glucose, Bld: 97 mg/dL (ref 70–99)
Potassium: 4.2 mmol/L (ref 3.5–5.1)
Sodium: 139 mmol/L (ref 135–145)

## 2020-01-13 LAB — CBC
HCT: 39.6 % (ref 36.0–46.0)
Hemoglobin: 13 g/dL (ref 12.0–15.0)
MCH: 32.3 pg (ref 26.0–34.0)
MCHC: 32.8 g/dL (ref 30.0–36.0)
MCV: 98.5 fL (ref 80.0–100.0)
Platelets: 312 10*3/uL (ref 150–400)
RBC: 4.02 MIL/uL (ref 3.87–5.11)
RDW: 12.4 % (ref 11.5–15.5)
WBC: 5.8 10*3/uL (ref 4.0–10.5)
nRBC: 0 % (ref 0.0–0.2)

## 2020-01-13 LAB — TROPONIN I (HIGH SENSITIVITY): Troponin I (High Sensitivity): 4 ng/L (ref <18)

## 2020-01-13 MED ORDER — PREDNISONE 20 MG PO TABS
60.0000 mg | ORAL_TABLET | Freq: Once | ORAL | Status: AC
  Administered 2020-01-13: 60 mg via ORAL
  Filled 2020-01-13: qty 3

## 2020-01-13 MED ORDER — AZITHROMYCIN 500 MG PO TABS
500.0000 mg | ORAL_TABLET | Freq: Once | ORAL | Status: AC
  Administered 2020-01-13: 500 mg via ORAL
  Filled 2020-01-13: qty 1

## 2020-01-13 MED ORDER — HYDROCOD POLST-CPM POLST ER 10-8 MG/5ML PO SUER: 5.0000 mL | Freq: Two times a day (BID) | ORAL | 0 refills | Status: DC | PRN

## 2020-01-13 MED ORDER — PREDNISONE 20 MG PO TABS: 60.0000 mg | ORAL_TABLET | Freq: Every day | ORAL | 0 refills | Status: AC

## 2020-01-13 MED ORDER — HYDROCOD POLST-CPM POLST ER 10-8 MG/5ML PO SUER
5.0000 mL | Freq: Once | ORAL | Status: AC
  Administered 2020-01-13: 5 mL via ORAL
  Filled 2020-01-13: qty 5

## 2020-01-13 MED ORDER — AZITHROMYCIN 500 MG PO TABS: 500.0000 mg | ORAL_TABLET | Freq: Every day | ORAL | 0 refills | Status: AC

## 2020-01-13 NOTE — ED Notes (Signed)
E-signature pad in room did not work.

## 2020-01-13 NOTE — ED Provider Notes (Signed)
[facility] Emergency Department Provider Note  ____________________________________________   First MD Initiated Contact with Patient 01/13/20 1657     (approximate)  I have reviewed the triage vital signs and the nursing notes.   HISTORY  Chief Complaint Shortness of Breath    HPI Heidi [name] is a 64 y.o. female with below list of previous medical conditions including COPD presents to the emergency department secondary to a 2-day history of worsening cough.  Patient denies any fever.  Patient denies any nausea or vomiting.  Patient denies any chest pain.  Patient denies any lower extremity pain or swelling.        Past Medical History:  Diagnosis Date  . Anxiety   . COPD (chronic obstructive pulmonary disease) ([place])   . GERD (gastroesophageal reflux disease)   . Hypotension    limiting med titration  . NSTEMI (non-ST elevated myocardial infarction) [facility]) 2013   12/2011 with normal coronaries possibly secondary to Takotsubo (EF 30-35% by echo), occurred following two episodes of acute respiratory distress  . Pneumonia 11/2016   history of  . 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 GAD (generalized anxiety disorder) 06/02/2017  . Symptomatic cholelithiasis 06/02/2017  . Elevated alkaline phosphatase level 04/09/2017  . Sepsis due to pneumonia ([place]) 12/02/2016  . Fine tremor 02/17/2016  . COPD (chronic obstructive pulmonary disease) ([place]) 01/13/2016  . Near syncope 12/01/2015  . Aortic atherosclerosis ([place]) 12/01/2015  . CAP (community acquired pneumonia) 01/05/2015  . Ex-smoker 07/14/2014  . Allergic rhinitis 03/03/2014  . Anxiety 04/10/2013  . Hoarseness 10/10/2012  . Dysphonia 10/10/2012  . Acute respiratory failure ([place]) 08/08/2012  . Acute encephalopathy 08/08/2012  . COPD with acute exacerbation ([place]) 08/08/2012  . Lactic acidosis 08/08/2012  . Chronic respiratory failure ([place])  03/27/2012  . Takotsubo cardiomyopathy 01/31/2012  . Chest pain 01/23/2012  . TINGLING 05/26/2010  . SKIN LESION 11/08/2009  . ASCUS PAP 08/02/2009  . VAG HIGH RISK HUMAN PAPILLOMAVIRUS DNA TEST POS 08/02/2009  . DEPRESSION, CHRONIC with anxiety 06/22/2009  . MICROSCOPIC HEMATURIA 06/22/2009  . Smoking history 04/06/2009  . BRUISE 04/06/2009  . URI 10/09/2007  . HYSTERECTOMY, PARTIAL, HX OF 07/01/2007  . COPD exacerbation ([place]) 06/18/2007  . OSTEOPENIA 06/18/2007  . 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 ABDOMINAL HYSTERECTOMY    . CARDIAC CATHETERIZATION    . CHOLECYSTECTOMY N/A 06/05/2017   Procedure: LAPAROSCOPIC CHOLECYSTECTOMY;  Surgeon: Leonie Green, MD;  Location: [facility] ORS;  Service: General;  Laterality: N/A;  . FRACTURE SURGERY Left 07/2016   leg compound fraction  . LEFT HEART CATHETERIZATION WITH CORONARY ANGIOGRAM N/A 01/23/2012   Procedure: LEFT HEART CATHETERIZATION WITH CORONARY ANGIOGRAM;  Surgeon: Jolaine Artist, MD;  Location: [facility] CATH LAB;  Service: Cardiovascular;  Laterality: N/A;  . vagina rectal repair     after childbirth    Prior to Admission medications   Medication Sig Start Date End Date Taking? Authorizing Provider  albuterol (PROVENTIL HFA;VENTOLIN HFA) 108 (90 Base) MCG/ACT inhaler Inhale 1-2 puffs into the lungs every 4 (four) hours as needed for wheezing or shortness of breath.    [provider]  albuterol (PROVENTIL) (2.5 MG/3ML) 0.083% nebulizer solution Take 3 mLs (2.5 mg total) by nebulization every 6 (six) hours as needed for wheezing or shortness of breath. 07/10/19   Parrett, Fonnie Mu,  NP  ALPRAZolam (XANAX) 0.25 MG tablet Take 0.25 mg by mouth 2 (two) times daily.     [provider]  escitalopram (LEXAPRO) 20 MG tablet Take 1 tablet (20 mg total) by mouth daily. 11/14/18   Jomarie Longs, MD  fluticasone  (FLONASE) 50 MCG/ACT nasal spray USE 2 SPRAYS IN EACH NOSTRIL ONCE DAILY 05/01/18   Lupita Leash, MD  Fluticasone-Umeclidin-Vilant (TRELEGY ELLIPTA) 100-62.5-25 MCG/INH AEPB Inhale 1 puff into the lungs daily. 07/10/19   Parrett, Tammy S, NP  Ipratropium-Albuterol (COMBIVENT RESPIMAT) 20-100 MCG/ACT AERS respimat INHALE 1 PUFF BY MOUTH EVERY 6 HOURS AS NEEDED FOR WHEEZING 07/10/19   Parrett, Tammy S, NP  OXYGEN Inhale 2 L into the lungs as needed.    [provider]  pantoprazole (PROTONIX) 40 MG tablet Take 40 mg by mouth daily. Take 30-60 min before first meal of the day  01/06/13   Nyoka Cowden, MD    Allergies Prozac [fluoxetine], Buspar [buspirone], Levaquin [levofloxacin in d5w], Propranolol, and Zoloft [sertraline hcl]  Family History  Problem Relation Age of Onset  . Heart attack Mother        deceased from massive MI at age 35  . Liver disease Father        fatty liver; living, age 45  . Stroke Brother        at age 20, living   . Hypertension Brother        living, age 53    Social History Social History   Tobacco Use  . Smoking status: Former Smoker    Packs/day: 1.00    Years: 30.00    Pack years: 30.00    Types: Cigarettes    Quit date: 01/21/2012    Years since quitting: 7.9  . Smokeless tobacco: Never Used  Substance Use Topics  . Alcohol use: Yes    Comment: 1-2 beers per week  . Drug use: No    Review of Systems Constitutional: No fever/chills Eyes: No visual changes. ENT: No sore throat. Cardiovascular: Denies chest pain. Respiratory: Positive for shortness of breath.  Positive for cough Gastrointestinal: No abdominal pain.  No nausea, no vomiting.  No diarrhea.  No constipation. Genitourinary: Negative for dysuria. Musculoskeletal: Negative for neck pain.  Negative for back pain. Integumentary: Negative for rash. Neurological: Negative for headaches, focal weakness or  numbness.   ____________________________________________   PHYSICAL EXAM:  VITAL SIGNS: ED Triage Vitals  Enc Vitals Group     BP 01/13/20 1344 (!) 125/56     Pulse Rate 01/13/20 1344 86     Resp 01/13/20 1342 20     Temp 01/13/20 1349 98.2 F (36.8 C)     Temp Source 01/13/20 1342 Oral     SpO2 01/13/20 1344 98 %     Weight 01/13/20 1344 61.2 kg (135 lb)     Height 01/13/20 1344 1.651 m (5\' 5" )     Head Circumference --      Peak Flow --      Pain Score 01/13/20 1344 0     Pain Loc --      Pain Edu? --      Excl. in GC? --     Constitutional: Alert and oriented.  Eyes: Conjunctivae are normal.  Head: Atraumatic. Mouth/Throat: Patient is wearing a mask. Neck: No stridor.  No meningeal signs.   Cardiovascular: Normal rate, regular rhythm. Good peripheral circulation. Grossly normal heart sounds. Respiratory: Normal respiratory effort.  No retractions. Gastrointestinal: Soft and  nontender. No distention.  Musculoskeletal: No lower extremity tenderness nor edema. No gross deformities of extremities. Neurologic:  Normal speech and language. No gross focal neurologic deficits are appreciated.  Skin:  Skin is warm, dry and intact. Psychiatric: Mood and affect are normal. Speech and behavior are normal.  ____________________________________________   LABS (all labs ordered are listed, but only abnormal results are displayed)  Labs Reviewed  BASIC METABOLIC PANEL  CBC  TROPONIN I (HIGH SENSITIVITY)  TROPONIN I (HIGH SENSITIVITY)   ____________________________________________  EKG  ED ECG REPORT I, [place] Emillia Weatherly, the attending physician, personally viewed and interpreted this ECG.   Date: 01/13/2020  EKG Time: 2:06 PM  Rate: 104  Rhythm: Sinus tachycardia  Axis: Normal  Intervals: Normal  ST&T Change: None  ____________________________________________  RADIOLOGY I,  N Jarius Dieudonne, personally viewed and evaluated these images (plain radiographs) as  part of my medical decision making, as well as reviewing the written report by the radiologist.  ED MD interpretation: Chronic emphysema pulmonary scarring.  Questionable mild patchy infiltrate in the lingula.  Official radiology report(s): DG Chest 2 View  Result Date: 01/13/2020 CLINICAL DATA:  Shortness of breath and cough EXAM: CHEST - 2 VIEW COMPARISON:  05/07/2019 FINDINGS: Heart size is normal. Chronic aortic atherosclerosis. Chronic emphysema and pulmonary scarring. One could question a minimal patchy infiltrate in the region of the lingula. No sign of mass, effusion or collapse. No acute bone finding. IMPRESSION: Chronic emphysema and pulmonary scarring. Question mild patchy infiltrate in the lingula. Aortic atherosclerosis. Electronically Signed   By: Paulina Fusi M.D.   On: 01/13/2020 14:56    ____________________________________________     Procedures   ____________________________________________   INITIAL IMPRESSION / MDM / ASSESSMENT AND PLAN / ED COURSE  As part of my medical decision making, I reviewed the following data within the electronic MEDICAL RECORD NUMBER   64 year old female presented with above-stated history and physical exam concerning for community-acquired pneumonia, acute on chronic bronchitis.  Chest x-ray consistent with possible infiltrate in the lingula.  As such patient given azithromycin 500 mg and will be prescribed the same for home as well as prednisone  ____________________________________________  FINAL CLINICAL IMPRESSION(S) / ED DIAGNOSES  Final diagnoses:  Community acquired pneumonia of left lower lobe of lung     MEDICATIONS GIVEN DURING THIS VISIT:  Medications - No data to display   ED Discharge Orders    None      *Please note:  Heidi [name] was evaluated in Emergency Department on 01/13/2020 for the symptoms described in the history of present illness. She was evaluated in the context of the global COVID-19 pandemic,  which necessitated consideration that the patient might be at risk for infection with the SARS-CoV-2 virus that causes COVID-19. Institutional protocols and algorithms that pertain to the evaluation of patients at risk for COVID-19 are in a state of rapid change based on information released by regulatory bodies including the CDC and federal and state organizations. These policies and algorithms were followed during the patient's care in the ED.  Some ED evaluations and interventions may be delayed as a result of limited staffing during the pandemic.*  Note:  This document was prepared using Dragon voice recognition software and may include unintentional dictation errors.   Darci Current, MD 01/13/20 2111

## 2020-01-13 NOTE — ED Triage Notes (Addendum)
Pt states she has had increased SOB with dry cough for the past couple of days, pt is on 2L [place] continuous pt has a hx of COPD. States she has been taking inhalers at home. Pt had labored breathing upon arrival, ambulating to the WR, with rest improved.

## 2020-05-14 ENCOUNTER — Telehealth: Payer: Self-pay

## 2020-05-14 NOTE — Telephone Encounter (Signed)
Contacted patient to schedule her annual lung screening CT scan.  Left message for patient to call Glenna Fellows, lung navigator to schedule scan.

## 2020-05-19 ENCOUNTER — Telehealth: Payer: Self-pay

## 2020-05-19 DIAGNOSIS — Z87891 Personal history of nicotine dependence: Secondary | ICD-10-CM

## 2020-05-19 DIAGNOSIS — Z122 Encounter for screening for malignant neoplasm of respiratory organs: Secondary | ICD-10-CM

## 2020-05-19 NOTE — Telephone Encounter (Signed)
Contacted patient to schedule annual lung screening CT.  Patient is agreeable and appointment for scan schedule Thursday, Aug 12 at 12:30.  Patient is aware of where to go.  She has not had a COVID vaccine in the past 4 weeks.  Her current smoking status is a non smoker-she has been a non smoker for 8 years.  Patient has no condition that would prevent her from receiving treatment in the event of a cancer finding.

## 2020-05-20 NOTE — Addendum Note (Signed)
Addended by: Jonne Ply on: 05/20/2020 01:24 PM   Modules accepted: Orders

## 2020-05-20 NOTE — Telephone Encounter (Signed)
Smoking history: former, quit 01/21/12, 40 pack year

## 2020-06-10 ENCOUNTER — Ambulatory Visit: Admission: RE | Admit: 2020-06-10 | Payer: Medicare Other | Source: Ambulatory Visit

## 2020-06-14 ENCOUNTER — Ambulatory Visit: Payer: Medicare Other

## 2020-07-09 ENCOUNTER — Other Ambulatory Visit: Payer: Self-pay

## 2020-07-09 ENCOUNTER — Emergency Department: Payer: Medicare Other

## 2020-07-09 ENCOUNTER — Inpatient Hospital Stay
Admission: EM | Admit: 2020-07-09 | Discharge: 2020-07-12 | DRG: 190 | Disposition: A | Discharge status: Home / Self Care | Payer: Medicare Other | Attending: Internal Medicine | Admitting: Internal Medicine

## 2020-07-09 DIAGNOSIS — Z90710 Acquired absence of both cervix and uterus: Secondary | ICD-10-CM | POA: Diagnosis not present

## 2020-07-09 DIAGNOSIS — J439 Emphysema, unspecified: Secondary | ICD-10-CM | POA: Diagnosis not present

## 2020-07-09 DIAGNOSIS — F419 Anxiety disorder, unspecified: Secondary | ICD-10-CM

## 2020-07-09 DIAGNOSIS — F3289 Other specified depressive episodes: Secondary | ICD-10-CM | POA: Diagnosis not present

## 2020-07-09 DIAGNOSIS — Z7951 Long term (current) use of inhaled steroids: Secondary | ICD-10-CM

## 2020-07-09 DIAGNOSIS — Z87891 Personal history of nicotine dependence: Secondary | ICD-10-CM

## 2020-07-09 DIAGNOSIS — G4700 Insomnia, unspecified: Secondary | ICD-10-CM | POA: Diagnosis present

## 2020-07-09 DIAGNOSIS — R0902 Hypoxemia: Secondary | ICD-10-CM

## 2020-07-09 DIAGNOSIS — J440 Chronic obstructive pulmonary disease with acute lower respiratory infection: Secondary | ICD-10-CM

## 2020-07-09 DIAGNOSIS — K219 Gastro-esophageal reflux disease without esophagitis: Secondary | ICD-10-CM | POA: Diagnosis present

## 2020-07-09 DIAGNOSIS — J9621 Acute and chronic respiratory failure with hypoxia: Secondary | ICD-10-CM | POA: Diagnosis not present

## 2020-07-09 DIAGNOSIS — I252 Old myocardial infarction: Secondary | ICD-10-CM | POA: Diagnosis not present

## 2020-07-09 DIAGNOSIS — J209 Acute bronchitis, unspecified: Secondary | ICD-10-CM | POA: Diagnosis not present

## 2020-07-09 DIAGNOSIS — Z8249 Family history of ischemic heart disease and other diseases of the circulatory system: Secondary | ICD-10-CM

## 2020-07-09 DIAGNOSIS — Z888 Allergy status to other drugs, medicaments and biological substances status: Secondary | ICD-10-CM | POA: Diagnosis not present

## 2020-07-09 DIAGNOSIS — I248 Other forms of acute ischemic heart disease: Secondary | ICD-10-CM | POA: Diagnosis present

## 2020-07-09 DIAGNOSIS — R778 Other specified abnormalities of plasma proteins: Secondary | ICD-10-CM

## 2020-07-09 DIAGNOSIS — I700 Atherosclerosis of aorta: Secondary | ICD-10-CM | POA: Diagnosis present

## 2020-07-09 DIAGNOSIS — Z881 Allergy status to other antibiotic agents status: Secondary | ICD-10-CM

## 2020-07-09 DIAGNOSIS — G4709 Other insomnia: Secondary | ICD-10-CM | POA: Diagnosis not present

## 2020-07-09 DIAGNOSIS — R7989 Other specified abnormal findings of blood chemistry: Secondary | ICD-10-CM

## 2020-07-09 DIAGNOSIS — J9602 Acute respiratory failure with hypercapnia: Secondary | ICD-10-CM | POA: Diagnosis present

## 2020-07-09 DIAGNOSIS — Z79899 Other long term (current) drug therapy: Secondary | ICD-10-CM | POA: Diagnosis not present

## 2020-07-09 DIAGNOSIS — Z20822 Contact with and (suspected) exposure to covid-19: Secondary | ICD-10-CM | POA: Diagnosis present

## 2020-07-09 DIAGNOSIS — J441 Chronic obstructive pulmonary disease with (acute) exacerbation: Secondary | ICD-10-CM | POA: Diagnosis not present

## 2020-07-09 DIAGNOSIS — J9601 Acute respiratory failure with hypoxia: Secondary | ICD-10-CM | POA: Diagnosis present

## 2020-07-09 DIAGNOSIS — R0603 Acute respiratory distress: Secondary | ICD-10-CM

## 2020-07-09 DIAGNOSIS — J9622 Acute and chronic respiratory failure with hypercapnia: Secondary | ICD-10-CM | POA: Diagnosis not present

## 2020-07-09 DIAGNOSIS — I2510 Atherosclerotic heart disease of native coronary artery without angina pectoris: Secondary | ICD-10-CM | POA: Diagnosis present

## 2020-07-09 DIAGNOSIS — I5181 Takotsubo syndrome: Secondary | ICD-10-CM | POA: Diagnosis present

## 2020-07-09 LAB — COMPREHENSIVE METABOLIC PANEL
ALT: 29 U/L (ref 0–44)
AST: 33 U/L (ref 15–41)
Albumin: 4.4 g/dL (ref 3.5–5.0)
Alkaline Phosphatase: 141 U/L — ABNORMAL HIGH (ref 38–126)
Anion gap: 10 (ref 5–15)
BUN: 10 mg/dL (ref 8–23)
CO2: 29 mmol/L (ref 22–32)
Calcium: 8.9 mg/dL (ref 8.9–10.3)
Chloride: 101 mmol/L (ref 98–111)
Creatinine, Ser: 0.79 mg/dL (ref 0.44–1.00)
GFR calc Af Amer: >60 mL/min (ref >60)
GFR calc non Af Amer: >60 mL/min (ref >60)
Glucose, Bld: 141 mg/dL — ABNORMAL HIGH (ref 70–99)
Potassium: 4.4 mmol/L (ref 3.5–5.1)
Sodium: 140 mmol/L (ref 135–145)
Total Bilirubin: 0.7 mg/dL (ref 0.3–1.2)
Total Protein: 8.4 g/dL — ABNORMAL HIGH (ref 6.5–8.1)

## 2020-07-09 LAB — BLOOD GAS, VENOUS
Acid-Base Excess: 2.6 mmol/L — ABNORMAL HIGH (ref 0.0–2.0)
Acid-Base Excess: 5.6 mmol/L — ABNORMAL HIGH (ref 0.0–2.0)
Bicarbonate: 31.9 mmol/L — ABNORMAL HIGH (ref 20.0–28.0)
Bicarbonate: 31.6 mmol/L — ABNORMAL HIGH (ref 20.0–28.0)
Delivery systems: POSITIVE
FIO2: 0.32
FIO2: 0.24
O2 Saturation: 94 %
O2 Saturation: 92.4 %
PEEP: 6 cmH2O
Patient temperature: 37
Patient temperature: 37
Pressure support: 12 cmH2O
RATE: 14 resp/min
pCO2, Ven: 71 mmHg (ref 44.0–60.0)
pCO2, Ven: 51 mmHg (ref 44.0–60.0)
pH, Ven: 7.26 (ref 7.250–7.430)
pH, Ven: 7.4 (ref 7.250–7.430)
pO2, Ven: 81 mmHg — ABNORMAL HIGH (ref 32.0–45.0)
pO2, Ven: 65 mmHg — ABNORMAL HIGH (ref 32.0–45.0)

## 2020-07-09 LAB — CBC WITH DIFFERENTIAL/PLATELET
Abs Immature Granulocytes: 0.03 10*3/uL (ref 0.00–0.07)
Basophils Absolute: 0.1 10*3/uL (ref 0.0–0.1)
Basophils Relative: 1 %
Eosinophils Absolute: 0.2 10*3/uL (ref 0.0–0.5)
Eosinophils Relative: 2 %
HCT: 40.4 % (ref 36.0–46.0)
Hemoglobin: 13.8 g/dL (ref 12.0–15.0)
Immature Granulocytes: 0 %
Lymphocytes Relative: 19 %
Lymphs Abs: 1.5 10*3/uL (ref 0.7–4.0)
MCH: 31.7 pg (ref 26.0–34.0)
MCHC: 34.2 g/dL (ref 30.0–36.0)
MCV: 92.9 fL (ref 80.0–100.0)
Monocytes Absolute: 0.5 10*3/uL (ref 0.1–1.0)
Monocytes Relative: 7 %
Neutro Abs: 5.5 10*3/uL (ref 1.7–7.7)
Neutrophils Relative %: 71 %
Platelets: 352 10*3/uL (ref 150–400)
RBC: 4.35 MIL/uL (ref 3.87–5.11)
RDW: 13.3 % (ref 11.5–15.5)
WBC: 7.8 10*3/uL (ref 4.0–10.5)
nRBC: 0 % (ref 0.0–0.2)

## 2020-07-09 LAB — TROPONIN I (HIGH SENSITIVITY)
Troponin I (High Sensitivity): 17 ng/L (ref <18)
Troponin I (High Sensitivity): 101 ng/L (ref <18)

## 2020-07-09 LAB — APTT: aPTT: 30 seconds (ref 24–36)

## 2020-07-09 LAB — LACTIC ACID, PLASMA
Lactic Acid, Venous: 1.2 mmol/L (ref 0.5–1.9)
Lactic Acid, Venous: 0.8 mmol/L (ref 0.5–1.9)

## 2020-07-09 LAB — FIBRIN DERIVATIVES D-DIMER (ARMC ONLY): Fibrin derivatives D-dimer (ARMC): 836.19 ng/mL (FEU) — ABNORMAL HIGH (ref 0.00–499.00)

## 2020-07-09 LAB — BRAIN NATRIURETIC PEPTIDE: B Natriuretic Peptide: 42.9 pg/mL (ref 0.0–100.0)

## 2020-07-09 LAB — SARS CORONAVIRUS 2 BY RT PCR (HOSPITAL ORDER, PERFORMED IN ~~LOC~~ HOSPITAL LAB): SARS Coronavirus 2: NEGATIVE

## 2020-07-09 LAB — PROTIME-INR
INR: 1 (ref 0.8–1.2)
Prothrombin Time: 12.3 seconds (ref 11.4–15.2)

## 2020-07-09 MED ORDER — IPRATROPIUM-ALBUTEROL 0.5-2.5 (3) MG/3ML IN SOLN
3.0000 mL | Freq: Once | RESPIRATORY_TRACT | Status: AC
  Administered 2020-07-09: 3 mL via RESPIRATORY_TRACT
  Filled 2020-07-09: qty 3

## 2020-07-09 MED ORDER — ESCITALOPRAM OXALATE 10 MG PO TABS
20.0000 mg | ORAL_TABLET | Freq: Every day | ORAL | Status: DC
  Administered 2020-07-10 – 2020-07-12 (×3): 20 mg via ORAL
  Filled 2020-07-09 (×3): qty 2

## 2020-07-09 MED ORDER — IPRATROPIUM-ALBUTEROL 0.5-2.5 (3) MG/3ML IN SOLN
3.0000 mL | Freq: Four times a day (QID) | RESPIRATORY_TRACT | Status: DC
  Administered 2020-07-09 – 2020-07-12 (×10): 3 mL via RESPIRATORY_TRACT
  Filled 2020-07-09 (×8): qty 3

## 2020-07-09 MED ORDER — IOHEXOL 350 MG/ML SOLN
75.0000 mL | Freq: Once | INTRAVENOUS | Status: AC | PRN
  Administered 2020-07-09: 75 mL via INTRAVENOUS

## 2020-07-09 MED ORDER — HEPARIN SODIUM (PORCINE) 5000 UNIT/ML IJ SOLN: 60.0000 [IU]/kg | Freq: Once | INTRAMUSCULAR | Status: DC

## 2020-07-09 MED ORDER — MAGNESIUM HYDROXIDE 400 MG/5ML PO SUSP: 30.0000 mL | Freq: Every day | ORAL | Status: DC | PRN

## 2020-07-09 MED ORDER — ACETAMINOPHEN 325 MG PO TABS: 650.0000 mg | ORAL_TABLET | Freq: Four times a day (QID) | ORAL | Status: DC | PRN

## 2020-07-09 MED ORDER — HEPARIN (PORCINE) 25000 UT/250ML-% IV SOLN
700.0000 [IU]/h | INTRAVENOUS | Status: DC
  Administered 2020-07-09 – 2020-07-10 (×2): 700 [IU]/h via INTRAVENOUS
  Filled 2020-07-09 (×3): qty 250

## 2020-07-09 MED ORDER — ALPRAZOLAM 0.25 MG PO TABS
0.2500 mg | ORAL_TABLET | Freq: Two times a day (BID) | ORAL | Status: DC
  Administered 2020-07-09 – 2020-07-12 (×5): 0.25 mg via ORAL
  Filled 2020-07-09 (×6): qty 1

## 2020-07-09 MED ORDER — SODIUM CHLORIDE 0.9 % IV SOLN
500.0000 mg | INTRAVENOUS | Status: DC
  Administered 2020-07-09 – 2020-07-10 (×2): 500 mg via INTRAVENOUS
  Filled 2020-07-09 (×4): qty 500

## 2020-07-09 MED ORDER — METHYLPREDNISOLONE SODIUM SUCC 40 MG IJ SOLR
40.0000 mg | Freq: Three times a day (TID) | INTRAMUSCULAR | Status: DC
  Administered 2020-07-09 – 2020-07-12 (×8): 40 mg via INTRAVENOUS
  Filled 2020-07-09 (×8): qty 1

## 2020-07-09 MED ORDER — TRAZODONE HCL 50 MG PO TABS
25.0000 mg | ORAL_TABLET | Freq: Every evening | ORAL | Status: DC | PRN
  Administered 2020-07-10 – 2020-07-11 (×2): 25 mg via ORAL
  Filled 2020-07-09 (×3): qty 1

## 2020-07-09 MED ORDER — ENOXAPARIN SODIUM 40 MG/0.4ML ~~LOC~~ SOLN
40.0000 mg | SUBCUTANEOUS | Status: DC
  Filled 2020-07-09: qty 0.4

## 2020-07-09 MED ORDER — FLUTICASONE PROPIONATE 50 MCG/ACT NA SUSP
2.0000 | Freq: Every day | NASAL | Status: DC | PRN
  Filled 2020-07-09: qty 16

## 2020-07-09 MED ORDER — HYDROCOD POLST-CPM POLST ER 10-8 MG/5ML PO SUER: 5.0000 mL | Freq: Two times a day (BID) | ORAL | Status: DC | PRN

## 2020-07-09 MED ORDER — MAGNESIUM SULFATE 2 GM/50ML IV SOLN
2.0000 g | Freq: Once | INTRAVENOUS | Status: AC
  Administered 2020-07-09: 2 g via INTRAVENOUS
  Filled 2020-07-09: qty 50

## 2020-07-09 MED ORDER — ALPRAZOLAM 0.5 MG PO TABS
0.2500 mg | ORAL_TABLET | Freq: Once | ORAL | Status: AC
  Administered 2020-07-09: 0.25 mg via ORAL
  Filled 2020-07-09: qty 1

## 2020-07-09 MED ORDER — NITROGLYCERIN 0.4 MG SL SUBL: 0.4000 mg | SUBLINGUAL_TABLET | SUBLINGUAL | Status: DC | PRN

## 2020-07-09 MED ORDER — ONDANSETRON HCL 4 MG PO TABS: 4.0000 mg | ORAL_TABLET | Freq: Four times a day (QID) | ORAL | Status: DC | PRN

## 2020-07-09 MED ORDER — GUAIFENESIN ER 600 MG PO TB12
600.0000 mg | ORAL_TABLET | Freq: Two times a day (BID) | ORAL | Status: DC
  Administered 2020-07-09 – 2020-07-12 (×6): 600 mg via ORAL
  Filled 2020-07-09 (×6): qty 1

## 2020-07-09 MED ORDER — MONTELUKAST SODIUM 10 MG PO TABS
10.0000 mg | ORAL_TABLET | Freq: Every day | ORAL | Status: DC
  Administered 2020-07-09 – 2020-07-11 (×3): 10 mg via ORAL
  Filled 2020-07-09 (×3): qty 1

## 2020-07-09 MED ORDER — HEPARIN BOLUS VIA INFUSION
3500.0000 [IU] | Freq: Once | INTRAVENOUS | Status: AC
  Administered 2020-07-09: 3500 [IU] via INTRAVENOUS
  Filled 2020-07-09: qty 3500

## 2020-07-09 MED ORDER — ONDANSETRON HCL 4 MG/2ML IJ SOLN: 4.0000 mg | Freq: Four times a day (QID) | INTRAMUSCULAR | Status: DC | PRN

## 2020-07-09 MED ORDER — ASPIRIN 81 MG PO CHEW
324.0000 mg | CHEWABLE_TABLET | Freq: Once | ORAL | Status: AC
  Administered 2020-07-09: 324 mg via ORAL
  Filled 2020-07-09: qty 4

## 2020-07-09 MED ORDER — SODIUM CHLORIDE 0.9 % IV SOLN: INTRAVENOUS | Status: DC

## 2020-07-09 MED ORDER — MORPHINE SULFATE (PF) 2 MG/ML IV SOLN: 2.0000 mg | INTRAVENOUS | Status: DC | PRN

## 2020-07-09 MED ORDER — ACETAMINOPHEN 650 MG RE SUPP: 650.0000 mg | Freq: Four times a day (QID) | RECTAL | Status: DC | PRN

## 2020-07-09 MED ORDER — HEPARIN (PORCINE) 25000 UT/250ML-% IV SOLN: 10.0000 [IU]/kg/h | INTRAVENOUS | Status: DC

## 2020-07-09 MED ORDER — MIRTAZAPINE 15 MG PO TABS
30.0000 mg | ORAL_TABLET | Freq: Every day | ORAL | Status: DC
  Administered 2020-07-09 – 2020-07-11 (×3): 30 mg via ORAL
  Filled 2020-07-09 (×3): qty 2

## 2020-07-09 MED ORDER — SODIUM CHLORIDE 0.9 % IV SOLN
1.0000 g | INTRAVENOUS | Status: DC
  Administered 2020-07-09 – 2020-07-11 (×3): 1 g via INTRAVENOUS
  Filled 2020-07-09: qty 10
  Filled 2020-07-09: qty 1
  Filled 2020-07-09 (×2): qty 10

## 2020-07-09 NOTE — H&P (Signed)
PATIENT NAME: Heidi [name]    MR#:  [id number]  DATE OF BIRTH:  12/31/1955  DATE OF ADMISSION:  07/09/2020  PRIMARY CARE PHYSICIAN: Patrice Paradise, MD   REQUESTING/REFERRING PHYSICIAN: Willy Eddy, MD CHIEF COMPLAINT:   Chief Complaint  Patient presents with  . Shortness of Breath    HISTORY OF PRESENT ILLNESS:  Heidi [name]  is a 64 y.o. Caucasian female with a known history of COPD, GERD, coronary artery disease, anxiety and Takotsubo syndrome, who presented to emergency room with acute onset of worsening dyspnea with associated dry cough and wheezing for the last week.  She admitted to chest tightness as well as occasional bilateral lateral chest wall cramps with cough as well as palpitations.  She admits to nausea without vomiting or diarrhea or abdominal pain.  No fever or chills.  No leg pain or edema recent travels or surgeries.  She stated that she has been under stress lately and has been having insomnia.  Upon presentation to the emergency room, blood pressure was 135/92 with a heart rate of 111 with initially normal other vital signs. Pulse symmetry was briefly 88% on room air and later was up to 96%. Labs revealed unremarkable CMP. BNP was 42.9 and high-sensitivity troponin I was 17 and later 1 1. Lactic acid was 1.2 L 0.8. CBC was within normal. Fibrin derivatives D-dimer with 836.19. ABG showed pH 7.26 and after BiPAP 7.4, PCO2 of 71 and after BiPAP 51 and PCO2 was 81 and later 65 with bicarbonate of 31.9. COVID-19 PCR came back negative chest x-ray showed emphysematous changes with areas of scarring with no acute cardiopulmonary disease. Chest CTA revealed no evidence of pulmonary embolus. Showed no acute intrathoracic process and aortic atherosclerosis as well as emphysema.  The patient was given 4 baby aspirin, heparin bolus and infusion, DuoNeb's x2, 2 g of IV magnesium sulfate and p.o. Xanax. She will be admitted to a medical monitored  bed for further evaluation and management. PAST MEDICAL HISTORY:   Past Medical History:  Diagnosis Date  . Anxiety   . COPD (chronic obstructive pulmonary disease) ([facility])   . GERD (gastroesophageal reflux disease)   . Hypotension    limiting med titration  . NSTEMI (non-ST elevated myocardial infarction) [facility]) 2013   12/2011 with normal coronaries possibly secondary to Takotsubo (EF 30-35% by echo), occurred following two episodes of acute respiratory distress  . Pneumonia 11/2016   history of  . 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 ABDOMINAL HYSTERECTOMY    . CARDIAC CATHETERIZATION    . CHOLECYSTECTOMY N/A 06/05/2017   Procedure: LAPAROSCOPIC CHOLECYSTECTOMY;  Surgeon: Nadeen Landau, MD;  Location: [facility] ORS;  Service: General;  Laterality: N/A;  . FRACTURE SURGERY Left 07/2016   leg compound fraction  . LEFT HEART CATHETERIZATION WITH CORONARY ANGIOGRAM N/A 01/23/2012   Procedure: LEFT HEART CATHETERIZATION WITH CORONARY ANGIOGRAM;  Surgeon: Dolores Patty, MD;  Location[facility] CATH LAB;  Service: Cardiovascular;  Laterality: N/A;  . vagina rectal repair     after childbirth    SOCIAL HISTORY:   Social History   Tobacco Use  . Smoking status: Former Smoker    Packs/day: 1.00    Years: 30.00    Pack years: 30.00    Types: Cigarettes    Quit date: 01/21/2012    Years since quitting: 8.4  . Smokeless tobacco: Never  Used  Substance Use Topics  . Alcohol use: Yes    Comment: 1-2 beers per week    FAMILY HISTORY:   Family History  Problem Relation Age of Onset  . Heart attack Mother        deceased from massive MI at age 53  . Liver disease Father        fatty liver; living, age 90  . Stroke Brother        at age 28, living   . Hypertension Brother        living, age 55    DRUG ALLERGIES:   Allergies  Allergen Reactions  . Prozac [Fluoxetine] Itching  . Budesonide     Other  reaction(s): Dizziness  . Buspar [Buspirone] Other (See Comments)    Ache joints  . Levaquin [Levofloxacin In D5w] Hives  . Propranolol Other (See Comments)    dizziness  . Zoloft [Sertraline Hcl] Other (See Comments)    Increased anxiety    REVIEW OF SYSTEMS:   ROS As per history of present illness. All pertinent systems were reviewed above. Constitutional, HEENT, cardiovascular, respiratory, GI, GU, musculoskeletal, neuro, psychiatric, endocrine, integumentary and hematologic systems were reviewed and are otherwise negative/unremarkable except for positive findings mentioned above in the HPI.   MEDICATIONS AT HOME:   Prior to Admission medications   Medication Sig Start Date End Date Taking? Authorizing Provider  albuterol (PROVENTIL HFA;VENTOLIN HFA) 108 (90 Base) MCG/ACT inhaler Inhale 1-2 puffs into the lungs every 4 (four) hours as needed for wheezing or shortness of breath.   Yes [provider]  albuterol (PROVENTIL) (2.5 MG/3ML) 0.083% nebulizer solution Take 3 mLs (2.5 mg total) by nebulization every 6 (six) hours as needed for wheezing or shortness of breath. 07/10/19  Yes Parrett, Virgel Bouquet, NP  ALPRAZolam (XANAX) 0.25 MG tablet Take 0.25 mg by mouth 2 (two) times daily.    Yes [provider]  escitalopram (LEXAPRO) 20 MG tablet Take 20 mg by mouth daily.   Yes [provider]  fluticasone (FLONASE) 50 MCG/ACT nasal spray USE 2 SPRAYS IN EACH NOSTRIL ONCE DAILY 05/01/18  Yes McQuaid, Brooke Pace, MD  Fluticasone-Umeclidin-Vilant (TRELEGY ELLIPTA) 100-62.5-25 MCG/INH AEPB Inhale 1 puff into the lungs daily. 07/10/19  Yes Parrett, Tammy S, NP  Ipratropium-Albuterol (COMBIVENT RESPIMAT) 20-100 MCG/ACT AERS respimat INHALE 1 PUFF BY MOUTH EVERY 6 HOURS AS NEEDED FOR WHEEZING 07/10/19  Yes Parrett, Tammy S, NP  mirtazapine (REMERON) 30 MG tablet Take 30 mg by mouth at bedtime.   Yes [provider]  montelukast (SINGULAIR) 10 MG tablet Take 10 mg by  mouth at bedtime.   Yes [provider]      VITAL SIGNS:  Blood pressure 139/80, pulse 93, temperature 98.4 F (36.9 C), temperature source Oral, resp. rate (!) 26, height 5\' 5"  (1.651 m), weight 59 kg, SpO2 96 %.  PHYSICAL EXAMINATION:  Physical Exam  GENERAL:  64 y.o.-year-old Caucasian female patient lying in the bed with mild respiratory distress with conversational dyspnea. EYES: Pupils equal, round, reactive to light and accommodation. No scleral icterus. Extraocular muscles intact.  HEENT: Head atraumatic, normocephalic. Oropharynx and nasopharynx clear.  NECK:  Supple, no jugular venous distention. No thyroid enlargement, no tenderness.  LUNGS: Diffuse expiratory wheezes with tight expiratory airflow and harsh vesicular breathing. CARDIOVASCULAR: Regular rate and rhythm, S1, S2 normal. No murmurs, rubs, or gallops.  ABDOMEN: Soft, nondistended, nontender. Bowel sounds present. No organomegaly or mass.  EXTREMITIES: No pedal edema, cyanosis,  or clubbing.  NEUROLOGIC: Cranial nerves II through XII are intact. Muscle strength 5/5 in all extremities. Sensation intact. Gait not checked.  PSYCHIATRIC: The patient is alert and oriented x 3.  Normal affect and good eye contact. SKIN: No obvious rash, lesion, or ulcer.   LABORATORY PANEL:   CBC Recent Labs  Lab 07/09/20 1251  WBC 7.8  HGB 13.8  HCT 40.4  PLT 352   ------------------------------------------------------------------------------------------------------------------  Chemistries  Recent Labs  Lab 07/09/20 1251  NA 140  K 4.4  CL 101  CO2 29  GLUCOSE 141*  BUN 10  CREATININE 0.79  CALCIUM 8.9  AST 33  ALT 29  ALKPHOS 141*  BILITOT 0.7   ------------------------------------------------------------------------------------------------------------------  Cardiac Enzymes No results for input(s): TROPONINI in the last 168  hours. ------------------------------------------------------------------------------------------------------------------  RADIOLOGY:  CT Angio Chest PE W and/or Wo Contrast  Result Date: 07/09/2020 CLINICAL DATA:  Shortness of breath, elevated D dimer EXAM: CT ANGIOGRAPHY CHEST WITH CONTRAST TECHNIQUE: Multidetector CT imaging of the chest was performed using the standard protocol during bolus administration of intravenous contrast. Multiplanar CT image reconstructions and MIPs were obtained to evaluate the vascular anatomy. CONTRAST:  44mL OMNIPAQUE IOHEXOL 350 MG/ML SOLN COMPARISON:  05/07/2019, 07/09/2020 FINDINGS: Cardiovascular: This is a technically adequate evaluation of the pulmonary vasculature. No filling defects or pulmonary emboli. The heart is unremarkable without pericardial effusion. 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 There is extensive atherosclerosis of the aortic arch. Mediastinum/Nodes: No enlarged mediastinal, hilar, or axillary lymph nodes. Thyroid gland, trachea, and esophagus demonstrate no significant findings. Lungs/Pleura: There is severe background emphysema, with chronic right apical subpleural scarring. No airspace disease, effusion, or pneumothorax. Central airways are patent. Upper Abdomen: No acute abnormality. Musculoskeletal: No acute or destructive bony lesions. Reconstructed images demonstrate no additional findings. Review of the MIP images confirms the above findings. IMPRESSION: 1. No evidence of pulmonary embolus. 2. No acute intrathoracic process. 3. Aortic Atherosclerosis (ICD10-I70.0) and Emphysema (ICD10-J43.9). Electronically Signed   By: Sharlet Salina M.D.   On: 07/09/2020 16:10   DG Chest Portable 1 View  Result Date: 07/09/2020 CLINICAL DATA:  Shortness of breath EXAM: PORTABLE CHEST 1 VIEW COMPARISON:  January 13, 2020 chest radiograph and chest CT May 07 2019 FINDINGS: There is underlying emphysematous  change, better delineated on CT. There is scarring in the upper lobe regions, stable. There is also scarring in the lateral right base. There is no appreciable edema or airspace opacity. Slight interstitial prominence in the left mid and lower lung zones compared to other regions is stable and likely due to redistribution of blood flow to viable lung segments. The heart size is normal. Pulmonary vascularity is stable and reflects underlying emphysematous change. No adenopathy. No bone lesions. IMPRESSION: Underlying emphysematous changes with areas of scarring. No edema or airspace opacity. Stable cardiac silhouette. Electronically Signed   By: Bretta Bang III M.D.   On: 07/09/2020 13:21      IMPRESSION AND PLAN:   1. COPD exacerbation like secondary to acute bronchitis with subsequent acute hypoxic and hypercarbic respiratory failure. -The patient will be admitted to medical monitored bed. -We will bronchodilator therapy with DuoNebs 4 times daily and every 4 hours as needed. -The patient will be placed on steroid therapy with IV Solu-Medrol. -We will add IV Rocephin and Zithromax given severity of COPD exacerbation and requirement for BiPAP initially. --We will hold Trelegy for now and continue Singulair. -O2 protocol will be followed. -  Mucolytic therapy will be provided. -We will follow blood cultures.  2.  Chest tightness with elevated troponin I. -The patient will be followed with serial troponin I's. -The patient will be placed on as needed sublingual Eliquis and morphine sulfate for pain. -Her pain could be musculoskeletal from excessive cough. -We will obtain a 2D echo and a cardiology consultation in a.m. -I notified Dr. Juliann Paresallwood about the patient.  3.  Anxiety. -Continue Xanax.  4. GERD. -We will place patient on PPI therapy given current steroid therapy.  5. DVT prophylaxis. -Subcutaneous Lovenox.  All the records are reviewed and case discussed with ED  provider. The plan of care was discussed in details with the patient (and family). I answered all questions. The patient agreed to proceed with the above mentioned plan. Further management will depend upon hospital course.   CODE STATUS: Full code  Status is: Inpatient  Remains inpatient appropriate because:Ongoing diagnostic testing needed not appropriate for outpatient work up, Unsafe d/c plan, IV treatments appropriate due to intensity of illness or inability to take PO and Inpatient level of care appropriate due to severity of illness   Dispo: The patient is from: Home              Anticipated d/c is to: Home              Anticipated d/c date is: 2 days              Patient currently is not medically stable to d/c.   TOTAL TIME TAKING CARE OF THIS PATIENT: 55 minutes.    Hannah BeatJan A Gerrod Maule M.D on 07/09/2020 at 7:49 PM  [facility]   From 7 PM-7 AM, contact night-coverage [email or web address]  CC: Primary care physician; Patrice ParadiseMcLaughlin, Miriam K, MD   Note: This dictation was prepared with Dragon dictation along with smaller phrase technology. Any transcriptional typo errors that result from this process are unintentional.

## 2020-07-09 NOTE — ED Notes (Signed)
Pt taken off bipap at this time and placed on 3L [place]. Pt maintaining O2 sats of 98% with even and unlabored respirations at this time. Md notified.

## 2020-07-09 NOTE — ED Notes (Signed)
Date and time results received: 07/09/20 3:24 PM  Test: Troponin Critical Value: 101  Name of Provider Notified: Darnelle Catalan, MD

## 2020-07-09 NOTE — ED Triage Notes (Signed)
Pt here via [facility] from home.  At 11am pt had sudden onset of SHOB. Pt used her albuterol inhaler 8 times, EMS administered 125mg  solumedrol. PT usually on 3L [place], ems placed her on 5L  and pt is satting at 98%.

## 2020-07-09 NOTE — ED Notes (Signed)
x1 set blood cultures sent to lab.

## 2020-07-09 NOTE — ED Notes (Signed)
RT at bedside to place pt on bipap

## 2020-07-09 NOTE — ED Provider Notes (Signed)
[facility] Emergency Department Provider Note   ____________________________________________   First MD Initiated Contact with Patient 07/09/20 1246     (approximate)  I have reviewed the triage vital signs and the nursing notes.   HISTORY  Chief Complaint Shortness of Breath    HPI NEVA RAMASWAMY is a 64 y.o. female patient reports at 11:00 she had sudden onset of shortness of breath.  She is used her inhaler 8 times including twice with EMS.  EMS also gave her 125 to sign Medrol.  She is very short of breath very little air movement working very hard to breathe.  EMS gave her 5 L nasal cannula instead of her usual 3.  This was able to get her sats up to about 98%.  At 4 L she was reportedly at 90%.  She has a history of COPD.  She has had her Covid shots.  She is not coughing she is not having any fever or chest pain.        Past Medical History:  Diagnosis Date  . Anxiety   . COPD (chronic obstructive pulmonary disease) (HCC)   . GERD (gastroesophageal reflux disease)   . Hypotension    limiting med titration  . NSTEMI (non-ST elevated myocardial infarction) [facility]) 2013   12/2011 with normal coronaries possibly secondary to Takotsubo (EF 30-35% by echo), occurred following two episodes of acute respiratory distress  . Pneumonia 11/2016   history of  . 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 GAD (generalized anxiety disorder) 06/02/2017  . Symptomatic cholelithiasis 06/02/2017  . Elevated alkaline phosphatase level 04/09/2017  . Sepsis due to pneumonia (HCC) 12/02/2016  . Fine tremor 02/17/2016  . COPD (chronic obstructive pulmonary disease) (HCC) 01/13/2016  . Near syncope 12/01/2015  . Aortic atherosclerosis (HCC) 12/01/2015  . CAP (community acquired pneumonia) 01/05/2015  . Ex-smoker 07/14/2014  . Allergic rhinitis 03/03/2014  . Anxiety 04/10/2013  . Hoarseness 10/10/2012  . Dysphonia  10/10/2012  . Acute respiratory failure (HCC) 08/08/2012  . Acute encephalopathy 08/08/2012  . COPD with acute exacerbation (HCC) 08/08/2012  . Lactic acidosis 08/08/2012  . Chronic respiratory failure (HCC) 03/27/2012  . Takotsubo cardiomyopathy 01/31/2012  . Chest pain 01/23/2012  . TINGLING 05/26/2010  . SKIN LESION 11/08/2009  . ASCUS PAP 08/02/2009  . VAG HIGH RISK HUMAN PAPILLOMAVIRUS DNA TEST POS 08/02/2009  . DEPRESSION, CHRONIC with anxiety 06/22/2009  . MICROSCOPIC HEMATURIA 06/22/2009  . Smoking history 04/06/2009  . BRUISE 04/06/2009  . URI 10/09/2007  . HYSTERECTOMY, PARTIAL, HX OF 07/01/2007  . COPD exacerbation (HCC) 06/18/2007  . OSTEOPENIA 06/18/2007  . 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 ABDOMINAL HYSTERECTOMY    . CARDIAC CATHETERIZATION    . CHOLECYSTECTOMY N/A 06/05/2017   Procedure: LAPAROSCOPIC CHOLECYSTECTOMY;  Surgeon: Nadeen Landau, MD;  Location: [facility] ORS;  Service: General;  Laterality: N/A;  . FRACTURE SURGERY Left 07/2016   leg compound fraction  . LEFT HEART CATHETERIZATION WITH CORONARY ANGIOGRAM N/A 01/23/2012   Procedure: LEFT HEART CATHETERIZATION WITH CORONARY ANGIOGRAM;  Surgeon: Dolores Patty, MD;  Location: [facility] CATH LAB;  Service: Cardiovascular;  Laterality: N/A;  . vagina rectal repair     after childbirth    Prior to Admission medications   Medication Sig Start Date End Date Taking? Authorizing Provider  albuterol (PROVENTIL HFA;VENTOLIN HFA)  108 (90 Base) MCG/ACT inhaler Inhale 1-2 puffs into the lungs every 4 (four) hours as needed for wheezing or shortness of breath.    [provider]  albuterol (PROVENTIL) (2.5 MG/3ML) 0.083% nebulizer solution Take 3 mLs (2.5 mg total) by nebulization every 6 (six) hours as needed for wheezing or shortness of breath. 07/10/19   Parrett, Virgel Bouquetammy S, NP  ALPRAZolam (XANAX) 0.25  MG tablet Take 0.25 mg by mouth 2 (two) times daily.     [provider]  chlorpheniramine-HYDROcodone (TUSSIONEX PENNKINETIC ER) 10-8 MG/5ML SUER Take 5 mLs by mouth every 12 (twelve) hours as needed. 01/13/20   Darci CurrentBrown, [place] N, MD  escitalopram (LEXAPRO) 20 MG tablet Take 1 tablet (20 mg total) by mouth daily. 11/14/18   Jomarie LongsEappen, Saramma, MD  fluticasone (FLONASE) 50 MCG/ACT nasal spray USE 2 SPRAYS IN EACH NOSTRIL ONCE DAILY 05/01/18   Lupita LeashMcQuaid, Douglas B, MD  Fluticasone-Umeclidin-Vilant (TRELEGY ELLIPTA) 100-62.5-25 MCG/INH AEPB Inhale 1 puff into the lungs daily. 07/10/19   Parrett, Tammy S, NP  Ipratropium-Albuterol (COMBIVENT RESPIMAT) 20-100 MCG/ACT AERS respimat INHALE 1 PUFF BY MOUTH EVERY 6 HOURS AS NEEDED FOR WHEEZING 07/10/19   Parrett, Tammy S, NP  OXYGEN Inhale 2 L into the lungs as needed.    [provider]  pantoprazole (PROTONIX) 40 MG tablet Take 40 mg by mouth daily. Take 30-60 min before first meal of the day  01/06/13   Nyoka CowdenWert, Michael B, MD    Allergies Prozac [fluoxetine], Buspar [buspirone], Levaquin [levofloxacin in d5w], Propranolol, and Zoloft [sertraline hcl]  Family History  Problem Relation Age of Onset  . Heart attack Mother        deceased from massive MI at age 64  . Liver disease Father        fatty liver; living, age 64  . Stroke Brother        at age 64, living   . Hypertension Brother        living, age 64    Social History Social History   Tobacco Use  . Smoking status: Former Smoker    Packs/day: 1.00    Years: 30.00    Pack years: 30.00    Types: Cigarettes    Quit date: 01/21/2012    Years since quitting: 8.4  . Smokeless tobacco: Never Used  Vaping Use  . Vaping Use: Never used  Substance Use Topics  . Alcohol use: Yes    Comment: 1-2 beers per week  . Drug use: No    Review of Systems  Constitutional: No fever/chills Eyes: No visual changes. ENT: No sore throat. Cardiovascular: Denies chest pain. Respiratory:   shortness of breath. Gastrointestinal: No abdominal pain.  No nausea, no vomiting.  No diarrhea.  No constipation. Genitourinary: Negative for dysuria. Musculoskeletal: Negative for back pain. Skin: Negative for rash. Neurological: Negative for headaches, focal weakness   ____________________________________________   PHYSICAL EXAM:  VITAL SIGNS: ED Triage Vitals  Enc Vitals Group     BP --      Pulse --      Resp --      Temp --      Temp src --      SpO2 --      Weight 07/09/20 1247 130 lb (59 kg)     Height 07/09/20 1247 5\' 5"  (1.651 m)     Head Circumference --      Peak Flow --      Pain Score 07/09/20 1246 0  Pain Loc --      Pain Edu? --      Excl. in GC? --     Constitutional: Alert and oriented.  In respiratory distress not able to say more than 2 or 3 words at a time  eyes: Conjunctivae are normal. Head: Atraumatic. Nose: No congestion/rhinnorhea. Mouth/Throat: Mucous membranes are moist.  Oropharynx non-erythematous. Neck: No stridor. Cardiovascular: Normal rate, regular rhythm. Grossly normal heart sounds.  Good peripheral circulation. Respiratory: Markedly increased respiratory effort.   retractions. Lungs CTAB. Gastrointestinal: Soft and nontender. No distention. No abdominal bruits. No CVA tenderness. Musculoskeletal: No lower extremity tenderness nor edema.   Neurologic:  Normal speech and language. No gross focal neurologic deficits are appreciated.  Skin:  Skin is warm, dry and intact. No rash noted.   ____________________________________________   LABS (all labs ordered are listed, but only abnormal results are displayed)  Labs Reviewed  COMPREHENSIVE METABOLIC PANEL - Abnormal; Notable for the following components:      Result Value   Glucose, Bld 141 (*)    Total Protein 8.4 (*)    Alkaline Phosphatase 141 (*)    All other components within normal limits  FIBRIN DERIVATIVES D-DIMER ([facility] ONLY) - Abnormal; Notable for the following  components:   Fibrin derivatives D-dimer ([facility]) 836.19 (*)    All other components within normal limits  BLOOD GAS, VENOUS - Abnormal; Notable for the following components:   pCO2, Ven 71 (*)    pO2, Ven 81.0 (*)    Bicarbonate 31.9 (*)    Acid-Base Excess 2.6 (*)    All other components within normal limits  TROPONIN I (HIGH SENSITIVITY) - Abnormal; Notable for the following components:   Troponin I (High Sensitivity) 101 (*)    All other components within normal limits  SARS CORONAVIRUS 2 BY RT PCR (HOSPITAL ORDER, PERFORMED IN [place])  LACTIC ACID, PLASMA  LACTIC ACID, PLASMA  BRAIN NATRIURETIC PEPTIDE  CBC WITH DIFFERENTIAL/PLATELET  BLOOD GAS, VENOUS  TROPONIN I (HIGH SENSITIVITY)   ____________________________________________  EKG EKG #1 read interpreted by me shows sinus tach at a rate of 116 normal axis irregular baseline but no acute ST-T wave changes EKG #2 read interpreted by me shows sinus rhythm at 86 normal axis compared to EKG #1 possibly a small amount of flattening of the T waves in the precordial leads nonspecific changes ____________________________________________  RADIOLOGY  ED MD interpretation: Chest x-ray read by radiology shows emphysema no acute changes  Official radiology report(s): DG Chest Portable 1 View  Result Date: 07/09/2020 CLINICAL DATA:  Shortness of breath EXAM: PORTABLE CHEST 1 VIEW COMPARISON:  January 13, 2020 chest radiograph and chest CT May 07 2019 FINDINGS: There is underlying emphysematous change, better delineated on CT. There is scarring in the upper lobe regions, stable. There is also scarring in the lateral right base. There is no appreciable edema or airspace opacity. Slight interstitial prominence in the left mid and lower lung zones compared to other regions is stable and likely due to redistribution of blood flow to viable lung segments. The heart size is normal. Pulmonary vascularity is stable and reflects  underlying emphysematous change. No adenopathy. No bone lesions. IMPRESSION: Underlying emphysematous changes with areas of scarring. No edema or airspace opacity. Stable cardiac silhouette. Electronically Signed   By: Bretta Bang III M.D.   On: 07/09/2020 13:21    ____________________________________________   PROCEDURES  Procedure(s) performed (including Critical Care):  Procedures   ____________________________________________  INITIAL IMPRESSION / ASSESSMENT AND PLAN / ED COURSE  Patient doing much better on BiPAP.  We will repeat her VBG as the first 1 showed a good deal of acidosis and retention of CO2.  Her troponin is gone from 17 201.  Additionally her D-dimer is elevated.  She is waiting for CT angio of the chest.  She has had no chest pain.  Because of the possibility of slight ST T changes on the EKG and the rising troponin I will go ahead and start her on aspirin heparin.  We will plan to get her in the hospital.  It is possible that her troponin elevation is just from strain but it is a fairly large elevation.  Additionally she may have a PE causing strain.              ____________________________________________   FINAL CLINICAL IMPRESSION(S) / ED DIAGNOSES  Final diagnoses:  Acute respiratory distress  Hypoxia  Elevated troponin  Elevated d-dimer     ED Discharge Orders    None      *Please note:  SMERA GUYETTE was evaluated in Emergency Department on 07/09/2020 for the symptoms described in the history of present illness. She was evaluated in the context of the global COVID-19 pandemic, which necessitated consideration that the patient might be at risk for infection with the SARS-CoV-2 virus that causes COVID-19. Institutional protocols and algorithms that pertain to the evaluation of patients at risk for COVID-19 are in a state of rapid change based on information released by regulatory bodies including the CDC and federal and state  organizations. These policies and algorithms were followed during the patient's care in the ED.  Some ED evaluations and interventions may be delayed as a result of limited staffing during and the pandemic.*   Note:  This document was prepared using Dragon voice recognition software and may include unintentional dictation errors.    Arnaldo Natal, MD 07/09/20 1536

## 2020-07-09 NOTE — Progress Notes (Signed)
Pt transported to and from CT on BiPAP without incident

## 2020-07-09 NOTE — ED Notes (Signed)
Pt requested IV in new site as she kept bending her left arm and IV pump kept beeping. Place IV 20 gauge to left forearm and changed infusions to left forearm

## 2020-07-09 NOTE — ED Notes (Signed)
Pt given meal tray.

## 2020-07-09 NOTE — Consult Note (Signed)
ANTICOAGULATION CONSULT NOTE - Initial Consult  Pharmacy Consult for heparin Indication: chest pain/ACS  Allergies  Allergen Reactions  . Prozac [Fluoxetine] Itching  . Buspar [Buspirone] Other (See Comments)    Ache joints  . Levaquin [Levofloxacin In D5w] Hives  . Propranolol Other (See Comments)    dizziness  . Zoloft [Sertraline Hcl] Other (See Comments)    Increased anxiety    Patient Measurements: Height: 5\' 5"  (165.1 cm) Weight: 59 kg (130 lb) IBW/kg (Calculated) : 57 Heparin Dosing Weight: 59  Vital Signs: Temp: 98.4 F (36.9 C) (09/10 1300) Temp Source: Oral (09/10 1300) BP: 138/72 (09/10 1500) Pulse Rate: 88 (09/10 1530)  Labs: Recent Labs    07/09/20 1251 07/09/20 1441  HGB 13.8  --   HCT 40.4  --   PLT 352  --   CREATININE 0.79  --   TROPONINIHS 17 101*    Estimated Creatinine Clearance: 63.9 mL/min (by C-G formula based on SCr of 0.79 mg/dL).   Medical History: Past Medical History:  Diagnosis Date  . Anxiety   . COPD (chronic obstructive pulmonary disease) (HCC)   . GERD (gastroesophageal reflux disease)   . Hypotension    limiting med titration  . NSTEMI (non-ST elevated myocardial infarction) [facility]) 2013   12/2011 with normal coronaries possibly secondary to Takotsubo (EF 30-35% by echo), occurred following two episodes of acute respiratory distress  . Pneumonia 11/2016   history of  . 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 ALPRAZolam  0.25 mg Oral Once  . aspirin  324 mg Oral Once  . heparin  3,500 Units Intravenous Once    Assessment: 64 year old female presenting with sudden onset shortness of breath. PMH includes COPD, anxiety, GERD, NSTEMI 2013, and Takotsubo syndrome. High-sensitivity troponin 101. Pharmacy consulted to dose heparin for ACS  Goal of Therapy:  Heparin level 0.3-0.7 units/ml Monitor platelets by anticoagulation protocol: Yes   Plan:  Give 3500 units bolus x 1 Start heparin infusion at 700  units/hr Check anti-Xa level in 6 hours and daily while on heparin Continue to monitor H&H and platelets  2014, PharmD Pharmacy Resident  07/09/2020 3:48 PM

## 2020-07-10 ENCOUNTER — Inpatient Hospital Stay
Admit: 2020-07-10 | Discharge: 2020-07-10 | Disposition: A | Discharge status: Home / Self Care | Payer: Medicare Other | Attending: Family Medicine | Admitting: Family Medicine

## 2020-07-10 DIAGNOSIS — J9621 Acute and chronic respiratory failure with hypoxia: Secondary | ICD-10-CM

## 2020-07-10 DIAGNOSIS — J9622 Acute and chronic respiratory failure with hypercapnia: Secondary | ICD-10-CM

## 2020-07-10 DIAGNOSIS — G4709 Other insomnia: Secondary | ICD-10-CM

## 2020-07-10 LAB — BASIC METABOLIC PANEL
Anion gap: 8 (ref 5–15)
BUN: 10 mg/dL (ref 8–23)
CO2: 28 mmol/L (ref 22–32)
Calcium: 8.3 mg/dL — ABNORMAL LOW (ref 8.9–10.3)
Chloride: 105 mmol/L (ref 98–111)
Creatinine, Ser: 0.66 mg/dL (ref 0.44–1.00)
GFR calc Af Amer: >60 mL/min (ref >60)
GFR calc non Af Amer: >60 mL/min (ref >60)
Glucose, Bld: 154 mg/dL — ABNORMAL HIGH (ref 70–99)
Potassium: 4.3 mmol/L (ref 3.5–5.1)
Sodium: 141 mmol/L (ref 135–145)

## 2020-07-10 LAB — ECHOCARDIOGRAM COMPLETE
AR max vel: 2.47 cm2
AV Area VTI: 2.37 cm2
AV Area mean vel: 2.1 cm2
AV Mean grad: 5 mmHg
AV Peak grad: 8.5 mmHg
Ao pk vel: 1.46 m/s
Area-P 1/2: 4.8 cm2
Height: 65 in
P 1/2 time: 528 msec
S' Lateral: 3.24 cm
Weight: 2080 oz

## 2020-07-10 LAB — TROPONIN I (HIGH SENSITIVITY): Troponin I (High Sensitivity): 193 ng/L (ref <18)

## 2020-07-10 LAB — HIV ANTIBODY (ROUTINE TESTING W REFLEX): HIV Screen 4th Generation wRfx: NONREACTIVE

## 2020-07-10 LAB — HEPARIN LEVEL (UNFRACTIONATED)
Heparin Unfractionated: 0.35 IU/mL (ref 0.30–0.70)
Heparin Unfractionated: 0.38 IU/mL (ref 0.30–0.70)

## 2020-07-10 LAB — CBC
HCT: 35.4 % — ABNORMAL LOW (ref 36.0–46.0)
Hemoglobin: 12.2 g/dL (ref 12.0–15.0)
MCH: 32.2 pg (ref 26.0–34.0)
MCHC: 34.5 g/dL (ref 30.0–36.0)
MCV: 93.4 fL (ref 80.0–100.0)
Platelets: 263 10*3/uL (ref 150–400)
RBC: 3.79 MIL/uL — ABNORMAL LOW (ref 3.87–5.11)
RDW: 13.2 % (ref 11.5–15.5)
WBC: 5.3 10*3/uL (ref 4.0–10.5)
nRBC: 0 % (ref 0.0–0.2)

## 2020-07-10 MED ORDER — HYDROCOD POLST-CPM POLST ER 10-8 MG/5ML PO SUER
5.0000 mL | Freq: Two times a day (BID) | ORAL | Status: DC
  Administered 2020-07-10 – 2020-07-12 (×5): 5 mL via ORAL
  Filled 2020-07-10 (×5): qty 5

## 2020-07-10 MED ORDER — DIPHENHYDRAMINE HCL 25 MG PO CAPS
50.0000 mg | ORAL_CAPSULE | Freq: Every evening | ORAL | Status: DC | PRN
  Administered 2020-07-10 – 2020-07-11 (×2): 50 mg via ORAL
  Filled 2020-07-10 (×2): qty 2

## 2020-07-10 NOTE — Progress Notes (Addendum)
PROGRESS NOTE    Heidi [name]  LFY:[id number] DOB: [birth year] DOA: 07/09/2020 PCP: Patrice Paradise, MD   Assessment & Plan:   Active Problems:   COPD exacerbation (HCC)   COPD exacerbation: continue on IV rocephin, azithromycin, IV steroids, singulair & bronchodilators. Encourage incentive spirometry & flutter valve. Initially on BiPAP which has since been weaned off & currently on 2.5L . Blood cultures are pending. Continue on tussionex for cough.  Acute hypoxic respiratory failure: secondary to COPD exacerbation. Continue on supplemental oxygen and wean as tolerated. Management as stated above   Chest tightness: w/ elevated troponin. Likely secondary to demand ischemia. Morphine, nitro prn. Echo pending. Continue on tele. Cardio consulted   Anxiety: severity unknown. Continue on xanax  Insomnia: continue on home dose of remeron  GERD: continue on PPI     DVT prophylaxis: lovenox  Code Status: full  Family Communication: discussed pt's care w/ pt's family at bedside and answered their questions  Disposition Plan: likely d/c back home  Status is: Inpatient  Remains inpatient appropriate because:IV treatments appropriate due to intensity of illness or inability to take PO   Dispo: The patient is from: Home              Anticipated d/c is to: Home              Anticipated d/c date is: 3 days              Patient currently is not medically stable to d/c.         Consultants:   Cardio    Procedures:    Antimicrobials:    Subjective: Pt c/o shortness of breath and cough   Objective: Vitals:   07/09/20 2130 07/09/20 2259 07/10/20 0119 07/10/20 0456  BP: 132/72 128/64 139/82 134/80  Pulse: 90 90 81 86  Resp:  (!) 24 20 20   Temp:   (!) 97.5 F (36.4 C) 97.7 F (36.5 C)  TempSrc:   Oral Oral  SpO2: 97% 94% 100% 100%  Weight:      Height:        Intake/Output Summary (Last 24 hours) at 07/10/2020 0805 Last data filed at 07/10/2020  0653 Gross per 24 hour  Intake 424.63 ml  Output 1700 ml  Net -1275.37 ml   Filed Weights   07/09/20 1247  Weight: 59 kg    Examination:  General exam: Appears calm and comfortable  Respiratory system: course breath sounds b/l. Cardiovascular system: S1 & S2+. No  rubs, gallops or clicks.  Gastrointestinal system: Abdomen is nondistended, soft and nontender. Normal bowel sounds heard. Central nervous system: Alert and oriented. Moves all 4 extremities  Psychiatry: Judgement and insight appear normal. Mood & affect appropriate.     Data Reviewed: I have personally reviewed following labs and imaging studies  CBC: Recent Labs  Lab 07/09/20 1251 07/10/20 0434  WBC 7.8 5.3  NEUTROABS 5.5  --   HGB 13.8 12.2  HCT 40.4 35.4*  MCV 92.9 93.4  PLT 352 263   Basic Metabolic Panel: Recent Labs  Lab 07/09/20 1251 07/10/20 0434  NA 140 141  K 4.4 4.3  CL 101 105  CO2 29 28  GLUCOSE 141* 154*  BUN 10 10  CREATININE 0.79 0.66  CALCIUM 8.9 8.3*   GFR: Estimated Creatinine Clearance: 63.9 mL/min (by C-G formula based on SCr of 0.66 mg/dL). Liver Function Tests: Recent Labs  Lab 07/09/20 1251  AST 33  ALT 29  ALKPHOS  141*  BILITOT 0.7  PROT 8.4*  ALBUMIN 4.4   No results for input(s): LIPASE, AMYLASE in the last 168 hours. No results for input(s): AMMONIA in the last 168 hours. Coagulation Profile: Recent Labs  Lab 07/09/20 1733  INR 1.0   Cardiac Enzymes: No results for input(s): CKTOTAL, CKMB, CKMBINDEX, TROPONINI in the last 168 hours. BNP (last 3 results) No results for input(s): PROBNP in the last 8760 hours. HbA1C: No results for input(s): HGBA1C in the last 72 hours. CBG: No results for input(s): GLUCAP in the last 168 hours. Lipid Profile: No results for input(s): CHOL, HDL, LDLCALC, TRIG, CHOLHDL, LDLDIRECT in the last 72 hours. Thyroid Function Tests: No results for input(s): TSH, T4TOTAL, FREET4, T3FREE, THYROIDAB in the last 72  hours. Anemia Panel: No results for input(s): VITAMINB12, FOLATE, FERRITIN, TIBC, IRON, RETICCTPCT in the last 72 hours. Sepsis Labs: Recent Labs  Lab 07/09/20 1251 07/09/20 1441  LATICACIDVEN 1.2 0.8    Recent Results (from the past 240 hour(s))  SARS Coronavirus 2 by RT PCR (hospital order, performed in [facility] hospital lab) Nasopharyngeal Nasopharyngeal Swab     Status: None   Collection Time: 07/09/20  2:04 PM   Specimen: Nasopharyngeal Swab  Result Value Ref Range Status   SARS Coronavirus 2 NEGATIVE NEGATIVE Final    Comment: (NOTE) SARS-CoV-2 target nucleic acids are NOT DETECTED.  The SARS-CoV-2 RNA is generally detectable in upper and lower respiratory specimens during the acute phase of infection. The lowest concentration of SARS-CoV-2 viral copies this assay can detect is 250 copies / mL. A negative result does not preclude SARS-CoV-2 infection and should not be used as the sole basis for treatment or other patient management decisions.  A negative result may occur with improper specimen collection / handling, submission of specimen other than nasopharyngeal swab, presence of viral mutation(s) within the areas targeted by this assay, and inadequate number of viral copies (<250 copies / mL). A negative result must be combined with clinical observations, patient history, and epidemiological information.  Fact Sheet for Patients:   BoilerBrush.com.cy  Fact Sheet for Healthcare Providers: https://pope.com/  This test is not yet approved or  cleared by the [place] FDA and has been authorized for detection and/or diagnosis of SARS-CoV-2 by FDA under an Emergency Use Authorization (EUA).  This EUA will remain in effect (meaning this test can be used) for the duration of the COVID-19 declaration under Section 564(b)(1) of the Act, 21 U.S.C. section 360bbb-3(b)(1), unless the authorization is terminated or revoked  sooner.  Performed at [facility], [address] [place], [place] [address]          Radiology Studies: CT Angio Chest PE W and/or Wo Contrast  Result Date: 07/09/2020 CLINICAL DATA:  Shortness of breath, elevated D dimer EXAM: CT ANGIOGRAPHY CHEST WITH CONTRAST TECHNIQUE: Multidetector CT imaging of the chest was performed using the standard protocol during bolus administration of intravenous contrast. Multiplanar CT image reconstructions and MIPs were obtained to evaluate the vascular anatomy. CONTRAST:  55mL OMNIPAQUE IOHEXOL 350 MG/ML SOLN COMPARISON:  05/07/2019, 07/09/2020 FINDINGS: Cardiovascular: This is a technically adequate evaluation of the pulmonary vasculature. No filling defects or pulmonary emboli. The heart is unremarkable without pericardial effusion. 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 There is extensive atherosclerosis of the aortic arch. Mediastinum/Nodes: No enlarged mediastinal, hilar, or axillary lymph nodes. Thyroid gland, trachea, and esophagus demonstrate no significant findings. Lungs/Pleura: There is severe background  emphysema, with chronic right apical subpleural scarring. No airspace disease, effusion, or pneumothorax. Central airways are patent. Upper Abdomen: No acute abnormality. Musculoskeletal: No acute or destructive bony lesions. Reconstructed images demonstrate no additional findings. Review of the MIP images confirms the above findings. IMPRESSION: 1. No evidence of pulmonary embolus. 2. No acute intrathoracic process. 3. Aortic Atherosclerosis (ICD10-I70.0) and Emphysema (ICD10-J43.9). Electronically Signed   By: Sharlet Salina M.D.   On: 07/09/2020 16:10   DG Chest Portable 1 View  Result Date: 07/09/2020 CLINICAL DATA:  Shortness of breath EXAM: PORTABLE CHEST 1 VIEW COMPARISON:  January 13, 2020 chest radiograph and chest CT May 07 2019 FINDINGS: There is underlying emphysematous change,  better delineated on CT. There is scarring in the upper lobe regions, stable. There is also scarring in the lateral right base. There is no appreciable edema or airspace opacity. Slight interstitial prominence in the left mid and lower lung zones compared to other regions is stable and likely due to redistribution of blood flow to viable lung segments. The heart size is normal. Pulmonary vascularity is stable and reflects underlying emphysematous change. No adenopathy. No bone lesions. IMPRESSION: Underlying emphysematous changes with areas of scarring. No edema or airspace opacity. Stable cardiac silhouette. Electronically Signed   By: Bretta Bang III M.D.   On: 07/09/2020 13:21        Scheduled Meds: . ALPRAZolam  0.25 mg Oral BID  . escitalopram  20 mg Oral Daily  . guaiFENesin  600 mg Oral BID  . ipratropium-albuterol  3 mL Nebulization QID  . methylPREDNISolone (SOLU-MEDROL) injection  40 mg Intravenous Q8H  . mirtazapine  30 mg Oral QHS  . montelukast  10 mg Oral QHS   Continuous Infusions: . sodium chloride Stopped (07/09/20 2123)  . azithromycin Stopped (07/09/20 2231)  . cefTRIAXone (ROCEPHIN)  IV Stopped (07/09/20 2138)  . heparin 700 Units/hr (07/10/20 0653)     LOS: 1 day    Time spent: 34 mins     Charise Killian, MD [facility] Pager [phone]  If 7PM-7AM, please contact night-coverage [email or web address] 07/10/2020, 8:05 AM

## 2020-07-10 NOTE — Consult Note (Signed)
ANTICOAGULATION CONSULT NOTE - Initial Consult  Pharmacy Consult for heparin Indication: chest pain/ACS  Allergies  Allergen Reactions  . Prozac [Fluoxetine] Itching  . Budesonide     Other reaction(s): Dizziness  . Buspar [Buspirone] Other (See Comments)    Ache joints  . Levaquin [Levofloxacin In D5w] Hives  . Propranolol Other (See Comments)    dizziness  . Zoloft [Sertraline Hcl] Other (See Comments)    Increased anxiety    Patient Measurements: Height: 5\' 5"  (165.1 cm) Weight: 59 kg (130 lb) IBW/kg (Calculated) : 57 Heparin Dosing Weight: 59  Vital Signs: Temp: 97.5 F (36.4 C) (09/11 0119) Temp Source: Oral (09/11 0119) BP: 139/82 (09/11 0119) Pulse Rate: 81 (09/11 0119)  Labs: Recent Labs    07/09/20 1251 07/09/20 1441 07/09/20 1733 07/10/20 0036  HGB 13.8  --   --   --   HCT 40.4  --   --   --   PLT 352  --   --   --   APTT  --   --  30  --   LABPROT  --   --  12.3  --   INR  --   --  1.0  --   HEPARINUNFRC  --   --   --  0.38  CREATININE 0.79  --   --   --   TROPONINIHS 17 101*  --   --     Estimated Creatinine Clearance: 63.9 mL/min (by C-G formula based on SCr of 0.79 mg/dL).   Medical History: Past Medical History:  Diagnosis Date  . Anxiety   . COPD (chronic obstructive pulmonary disease) (HCC)   . GERD (gastroesophageal reflux disease)   . Hypotension    limiting med titration  . NSTEMI (non-ST elevated myocardial infarction) [facility] 2013   12/2011 with normal coronaries possibly secondary to Takotsubo (EF 30-35% by echo), occurred following two episodes of acute respiratory distress  . Pneumonia 11/2016   history of  . 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 ALPRAZolam  0.25 mg Oral BID  . escitalopram  20 mg Oral Daily  . guaiFENesin  600 mg Oral BID  . ipratropium-albuterol  3 mL Nebulization QID  . methylPREDNISolone (SOLU-MEDROL) injection  40 mg Intravenous Q8H  . mirtazapine  30 mg Oral QHS  . montelukast  10  mg Oral QHS    Assessment: 64 year old female presenting with sudden onset shortness of breath. PMH includes COPD, anxiety, GERD, NSTEMI 2013, and Takotsubo syndrome. High-sensitivity troponin 101. Pharmacy consulted to dose heparin for ACS  Goal of Therapy:  Heparin level 0.3-0.7 units/ml Monitor platelets by anticoagulation protocol: Yes   Plan:  09/11 @ 0030 HL 0.38 therapeutic. Will continue current rate and will recheck HL at 0630 and continue to monitor.  11/11, PharmD, BCPS Clinical Pharmacist 07/10/2020 2:40 AM

## 2020-07-10 NOTE — Progress Notes (Signed)
CARDIOLOGY CONSULT NOTE               Patient ID: Heidi CondonCynthia T [name] MRN: [id number] DOB/AGE: September 26, 1956 64 y.o.  Admit date: 07/09/2020 Referring Physician Dr Valente DavidJan Mansy hospitalist Primary Physician Dr. Luvenia ReddenMimi McLachlan primary Primary Cardiologist  [facility] Reason for Consultation SOB  HPI: Patient presents with a history of COPD GERD coronary disease anxiety Takotsubo's had cardiac evaluation several years ago but none recently she had a cardiac cath at the time of her Takotsubo diagnosis.  This time she is presented with dyspnea shortness of breath cough wheezing some nausea without vomiting or diarrhea patient was found on evaluation to have respiratory therapy distress with some chest discomfort she was placed on BiPAP to help with breathing and she stated is somewhat improved CT of the chest was unremarkable she has been taking DuoNeb's as well now here for follow-up evaluation quit smoking  Review of systems complete and found to be negative unless listed above     Past Medical History:  Diagnosis Date  . Anxiety   . COPD (chronic obstructive pulmonary disease) (HCC)   . GERD (gastroesophageal reflux disease)   . Hypotension    limiting med titration  . NSTEMI (non-ST elevated myocardial infarction) [facility](HCC) 2013   12/2011 with normal coronaries possibly secondary to Takotsubo (EF 30-35% by echo), occurred following two episodes of acute respiratory distress  . Pneumonia 11/2016   history of  . 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 ABDOMINAL HYSTERECTOMY    . CARDIAC CATHETERIZATION    . CHOLECYSTECTOMY N/A 06/05/2017   Procedure: LAPAROSCOPIC CHOLECYSTECTOMY;  Surgeon: Nadeen LandauSmith, Jarvis Wilton, MD;  Location: [facility] ORS;  Service: General;  Laterality: N/A;  . FRACTURE SURGERY Left 07/2016   leg compound fraction  . LEFT HEART CATHETERIZATION WITH CORONARY ANGIOGRAM N/A 01/23/2012   Procedure: LEFT HEART CATHETERIZATION WITH CORONARY  ANGIOGRAM;  Surgeon: Dolores Pattyaniel R Bensimhon, MD;  [facility];  Service: Cardiovascular;  Laterality: N/A;  . vagina rectal repair     after childbirth    Medications Prior to Admission  Medication Sig Dispense Refill Last Dose  . albuterol (PROVENTIL HFA;VENTOLIN HFA) 108 (90 Base) MCG/ACT inhaler Inhale 1-2 puffs into the lungs every 4 (four) hours as needed for wheezing or shortness of breath.   PRN at PRN  . albuterol (PROVENTIL) (2.5 MG/3ML) 0.083% nebulizer solution Take 3 mLs (2.5 mg total) by nebulization every 6 (six) hours as needed for wheezing or shortness of breath. 360 mL 5 07/08/2020 at PRN  . ALPRAZolam (XANAX) 0.25 MG tablet Take 0.25 mg by mouth 2 (two) times daily.    07/09/2020 at 1000  . escitalopram (LEXAPRO) 20 MG tablet Take 20 mg by mouth daily.   07/09/2020 at 1000  . fluticasone (FLONASE) 50 MCG/ACT nasal spray USE 2 SPRAYS IN EACH NOSTRIL ONCE DAILY 16 g 5 PRN at PRN  . Fluticasone-Umeclidin-Vilant (TRELEGY ELLIPTA) 100-62.5-25 MCG/INH AEPB Inhale 1 puff into the lungs daily. 1 each 5 07/08/2020 at Unknown time  . Ipratropium-Albuterol (COMBIVENT RESPIMAT) 20-100 MCG/ACT AERS respimat INHALE 1 PUFF BY MOUTH EVERY 6 HOURS AS NEEDED FOR WHEEZING 4 g 5 07/08/2020 at Unknown time  . mirtazapine (REMERON) 30 MG tablet Take 30 mg by mouth at bedtime.   07/08/2020 at Unknown time  . montelukast (SINGULAIR) 10 MG tablet Take 10 mg by mouth at bedtime.   07/08/2020 at Unknown time   Social History   Socioeconomic  History  . Marital status: Divorced    Spouse name: Not on file  . Number of children: 2  . Years of education: Not on file  . Highest education level: High school graduate  Occupational History  . Occupation: Airline pilot: [employer]    Comment: [employer]  Tobacco Use  . Smoking status: Former Smoker    Packs/day: 1.00    Years: 30.00    Pack years: 30.00    Types: Cigarettes    Quit date: 01/21/2012    Years since quitting: 8.4  . Smokeless tobacco: Never  Used  Vaping Use  . Vaping Use: Never used  Substance and Sexual Activity  . Alcohol use: Yes    Comment: 1-2 beers per week  . Drug use: No  . Sexual activity: Yes    Birth control/protection: Surgical  Other Topics Concern  . 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 She has two children, a daughter and son, who live in [place] area. She lives with her brother.   Social Determinants of Health   Financial Resource Strain:   . Difficulty of Paying Living Expenses: Not on file  Food Insecurity:   . Worried About Programme researcher, broadcasting/film/video in the Last Year: Not on file  . Ran Out of Food in the Last Year: Not on file  Transportation Needs:   . Lack of Transportation (Medical): Not on file  . Lack of Transportation (Non-Medical): Not on file  Physical Activity:   . Days of Exercise per Week: Not on file  . Minutes of Exercise per Session: Not on file  Stress:   . Feeling of Stress : Not on file  Social Connections:   . Frequency of Communication with Friends and Family: Not on file  . Frequency of Social Gatherings with Friends and Family: Not on file  . Attends Religious Services: Not on file  . Active Member of Clubs or Organizations: Not on file  . Attends Banker Meetings: Not on file  . Marital Status: Not on file  Intimate Partner Violence:   . Fear of Current or Ex-Partner: Not on file  . Emotionally Abused: Not on file  . Physically Abused: Not on file  . Sexually Abused: Not on file    Family History  Problem Relation Age of Onset  . Heart attack Mother        deceased from massive MI at age 51  . Liver disease Father        fatty liver; living, age 23  . Stroke Brother        at age 11, living   . Hypertension Brother        living, age 53      Review of systems complete and found to be negative unless listed above      PHYSICAL EXAM  General: Well developed, well nourished, in  no acute distress HEENT:  Normocephalic and atramatic Neck:  No JVD.  Lungs: Clear bilaterally to auscultation and percussion. Heart: HRRR . Normal S1 and S2 without gallops or murmurs.  Abdomen: Bowel sounds are positive, abdomen soft and non-tender  Msk:  Back normal, normal gait. Normal strength and tone for age. Extremities: No clubbing, cyanosis or edema.   Neuro: Alert and oriented X 3. Psych:  Good affect, responds appropriately  Labs:   Lab Results  Component Value Date  WBC 5.3 07/10/2020   HGB 12.2 07/10/2020   HCT 35.4 (L) 07/10/2020   MCV 93.4 07/10/2020   PLT 263 07/10/2020    Recent Labs  Lab 07/09/20 1251 07/09/20 1251 07/10/20 0434  NA 140   < > 141  K 4.4   < > 4.3  CL 101   < > 105  CO2 29   < > 28  BUN 10   < > 10  CREATININE 0.79   < > 0.66  CALCIUM 8.9   < > 8.3*  PROT 8.4*  --   --   BILITOT 0.7  --   --   ALKPHOS 141*  --   --   ALT 29  --   --   AST 33  --   --   GLUCOSE 141*   < > 154*   < > = values in this interval not displayed.   Lab Results  Component Value Date   CKTOTAL 138 03/02/2013   CKMB 1.6 03/02/2013   TROPONINI <0.03 08/06/2017    Lab Results  Component Value Date   CHOL 134 01/24/2012   CHOL 138 07/12/2009   CHOL 134 04/06/2009   Lab Results  Component Value Date   HDL 80 01/24/2012   HDL 66 07/12/2009   HDL 62 04/06/2009   Lab Results  Component Value Date   LDLCALC 41 01/24/2012   LDLCALC 54 07/12/2009   LDLCALC 52 04/06/2009   Lab Results  Component Value Date   TRIG 65 01/24/2012   TRIG 89 07/12/2009   TRIG 99 04/06/2009   Lab Results  Component Value Date   CHOLHDL 1.7 01/24/2012   CHOLHDL 2.1 Ratio 07/12/2009   CHOLHDL 2.2 Ratio 04/06/2009   No results found for: LDLDIRECT    Radiology: CT Angio Chest PE W and/or Wo Contrast  Result Date: 07/09/2020 CLINICAL DATA:  Shortness of breath, elevated D dimer EXAM: CT ANGIOGRAPHY CHEST WITH CONTRAST TECHNIQUE: Multidetector CT imaging of the chest  was performed using the standard protocol during bolus administration of intravenous contrast. Multiplanar CT image reconstructions and MIPs were obtained to evaluate the vascular anatomy. CONTRAST:  23mL OMNIPAQUE IOHEXOL 350 MG/ML SOLN COMPARISON:  05/07/2019, 07/09/2020 FINDINGS: Cardiovascular: This is a technically adequate evaluation of the pulmonary vasculature. No filling defects or pulmonary emboli. The heart is unremarkable without pericardial effusion. 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 There is extensive atherosclerosis of the aortic arch. Mediastinum/Nodes: No enlarged mediastinal, hilar, or axillary lymph nodes. Thyroid gland, trachea, and esophagus demonstrate no significant findings. Lungs/Pleura: There is severe background emphysema, with chronic right apical subpleural scarring. No airspace disease, effusion, or pneumothorax. Central airways are patent. Upper Abdomen: No acute abnormality. Musculoskeletal: No acute or destructive bony lesions. Reconstructed images demonstrate no additional findings. Review of the MIP images confirms the above findings. IMPRESSION: 1. No evidence of pulmonary embolus. 2. No acute intrathoracic process. 3. Aortic Atherosclerosis (ICD10-I70.0) and Emphysema (ICD10-J43.9). Electronically Signed   By: Sharlet Salina M.D.   On: 07/09/2020 16:10   DG Chest Portable 1 View  Result Date: 07/09/2020 CLINICAL DATA:  Shortness of breath EXAM: PORTABLE CHEST 1 VIEW COMPARISON:  January 13, 2020 chest radiograph and chest CT May 07 2019 FINDINGS: There is underlying emphysematous change, better delineated on CT. There is scarring in the upper lobe regions, stable. There is also scarring in the lateral right base. There is no appreciable edema or airspace opacity. Slight interstitial  prominence in the left mid and lower lung zones compared to other regions is stable and likely due to redistribution of blood flow to viable  lung segments. The heart size is normal. Pulmonary vascularity is stable and reflects underlying emphysematous change. No adenopathy. No bone lesions. IMPRESSION: Underlying emphysematous changes with areas of scarring. No edema or airspace opacity. Stable cardiac silhouette. Electronically Signed   By: Bretta Bang III M.D.   On: 07/09/2020 13:21   ECHOCARDIOGRAM COMPLETE  Result Date: 07/10/2020    ECHOCARDIOGRAM REPORT   Patient Name:   Heidi [name] Date of Exam: 07/10/2020 Medical Rec #:  [id number]        Height:       65.0 in Accession #:    [id number]       Weight:       130.0 lb Date of Birth:  12-Feb-1956        BSA:          1.647 m Patient Age:    64 years         BP:           134/80 mmHg Patient Gender: F                HR:           94 bpm. Exam Location:  [facility] Procedure: 2D Echo Indications:     Elevated Troponin  History:         Patient has no prior history of Echocardiogram examinations.  Sonographer:     Wonda Cerise RDCS Referring Phys:  [id number] Vernetta Honey MANSY Diagnosing Phys: Adrian Blackwater MD  Sonographer Comments: Technically difficult study due to poor echo windows. IMPRESSIONS  1. Left ventricular ejection fraction, by estimation, is 50 to 55%. The left ventricle has low normal function. The left ventricle has no regional wall motion abnormalities. Left ventricular diastolic parameters are consistent with Grade I diastolic dysfunction (impaired relaxation).  2. Right ventricular systolic function is normal. The right ventricular size is normal.  3. The mitral valve is normal in structure. No evidence of mitral valve regurgitation. No evidence of mitral stenosis.  4. The aortic valve is normal in structure. Aortic valve regurgitation is trivial. No aortic stenosis is present.  5. The inferior vena cava is normal in size with greater than 50% respiratory variability, suggesting right atrial pressure of 3 mmHg. FINDINGS  Left Ventricle: Left ventricular ejection fraction, by estimation,  is 50 to 55%. The left ventricle has low normal function. The left ventricle has no regional wall motion abnormalities. The left ventricular internal cavity size was normal in size. There is no left ventricular hypertrophy. Left ventricular diastolic parameters are consistent with Grade I diastolic dysfunction (impaired relaxation). Right Ventricle: The right ventricular size is normal. No increase in right ventricular wall thickness. Right ventricular systolic function is normal. Left Atrium: Left atrial size was normal in size. Right Atrium: Right atrial size was normal in size. Pericardium: There is no evidence of pericardial effusion. Mitral Valve: The mitral valve is normal in structure. No evidence of mitral valve regurgitation. No evidence of mitral valve stenosis. Tricuspid Valve: The tricuspid valve is normal in structure. Tricuspid valve regurgitation is trivial. No evidence of tricuspid stenosis. Aortic Valve: The aortic valve is normal in structure. Aortic valve regurgitation is trivial. Aortic regurgitation PHT measures 528 msec. No aortic stenosis is present. Aortic valve mean gradient measures 5.0 mmHg. Aortic valve peak gradient measures 8.5  mmHg. Aortic valve  area, by VTI measures 2.37 cm. Pulmonic Valve: The pulmonic valve was normal in structure. Pulmonic valve regurgitation is trivial. No evidence of pulmonic stenosis. Aorta: The aortic root is normal in size and structure. Venous: The inferior vena cava is normal in size with greater than 50% respiratory variability, suggesting right atrial pressure of 3 mmHg. IAS/Shunts: No atrial level shunt detected by color flow Doppler.  LEFT VENTRICLE PLAX 2D LVIDd:         4.55 cm  Diastology LVIDs:         3.24 cm  LV e' medial:    7.83 cm/s LV PW:         1.06 cm  LV E/e' medial:  9.0 LV IVS:        0.96 cm  LV e' lateral:   9.14 cm/s LVOT diam:     2.00 cm  LV E/e' lateral: 7.7 LV SV:         69 LV SV Index:   42 LVOT Area:     3.14 cm  RIGHT  VENTRICLE RV Basal diam:  2.82 cm RV S prime:     18.80 cm/s TAPSE (M-mode): 1.6 cm LEFT ATRIUM           Index       RIGHT ATRIUM          Index LA diam:      2.60 cm 1.58 cm/m  RA Area:     6.62 cm LA Vol (A2C): 14.9 ml 9.05 ml/m  RA Volume:   12.50 ml 7.59 ml/m LA Vol (A4C): 20.2 ml 12.26 ml/m  AORTIC VALVE AV Area (Vmax):    2.47 cm AV Area (Vmean):   2.10 cm AV Area (VTI):     2.37 cm AV Vmax:           146.00 cm/s AV Vmean:          109.000 cm/s AV VTI:            0.292 m AV Peak Grad:      8.5 mmHg AV Mean Grad:      5.0 mmHg LVOT Vmax:         115.00 cm/s LVOT Vmean:        72.900 cm/s LVOT VTI:          0.220 m LVOT/AV VTI ratio: 0.75 AI PHT:            528 msec  AORTA Ao Root diam: 3.20 cm Ao Asc diam:  2.80 cm MITRAL VALVE MV Area (PHT): 4.80 cm     SHUNTS MV Decel Time: 158 msec     Systemic VTI:  0.22 m MV E velocity: 70.30 cm/s   Systemic Diam: 2.00 cm MV A velocity: 114.00 cm/s MV E/A ratio:  0.62 Adrian Blackwater MD Electronically signed by Adrian Blackwater MD Signature Date/Time: 07/10/2020/7:30:58 PM    Final     EKG: Normal sinus rhythm nonspecific ST-T wave changes  ASSESSMENT AND PLAN:  COPD mild to moderate GERD History of previous Takotsubo Shortness of breath Chest tightness Anxiety . Plan Continue inhalers supplemental oxygen as necessary Agree with broad-spectrum antibiotic therapy for bronchitis CPAP and BiPAP as necessary to help with respiratory support Continue cardiac monitoring for now With negative troponins and EKG consider functional study possibly outpatient Echocardiogram for assessment of left ventricular function wall motion Recommend aggressive therapy for anxiety Proton pump inhibitors to help with reflux type symptoms Follow-up with cardiology 1 to 2 weeks  after discharge was  Signed: Alwyn Pea 07/10/2020, 9:45 PM

## 2020-07-10 NOTE — Progress Notes (Signed)
*  PRELIMINARY RESULTS* Echocardiogram 2D Echocardiogram has been performed.  [name] [name] 07/10/2020, 1:18 PM

## 2020-07-10 NOTE — Consult Note (Signed)
ANTICOAGULATION CONSULT NOTE - Initial Consult  Pharmacy Consult for heparin Indication: chest pain/ACS  Allergies  Allergen Reactions  . Prozac [Fluoxetine] Itching  . Budesonide     Other reaction(s): Dizziness  . Buspar [Buspirone] Other (See Comments)    Ache joints  . Levaquin [Levofloxacin In D5w] Hives  . Propranolol Other (See Comments)    dizziness  . Zoloft [Sertraline Hcl] Other (See Comments)    Increased anxiety    Patient Measurements: Height: 5\' 5"  (165.1 cm) Weight: 59 kg (130 lb) IBW/kg (Calculated) : 57 Heparin Dosing Weight: 59  Vital Signs: Temp: 97.7 F (36.5 C) (09/11 0456) Temp Source: Oral (09/11 0456) BP: 134/80 (09/11 0456) Pulse Rate: 86 (09/11 0456)  Labs: Recent Labs    07/09/20 1251 07/09/20 1441 07/09/20 1733 07/10/20 0036 07/10/20 0434 07/10/20 0655  HGB 13.8  --   --   --  12.2  --   HCT 40.4  --   --   --  35.4*  --   PLT 352  --   --   --  263  --   APTT  --   --  30  --   --   --   LABPROT  --   --  12.3  --   --   --   INR  --   --  1.0  --   --   --   HEPARINUNFRC  --   --   --  0.38  --  0.35  CREATININE 0.79  --   --   --  0.66  --   TROPONINIHS 17 101*  --   --  193*  --     Estimated Creatinine Clearance: 63.9 mL/min (by C-G formula based on SCr of 0.66 mg/dL).   Medical History: Past Medical History:  Diagnosis Date  . Anxiety   . COPD (chronic obstructive pulmonary disease) (HCC)   . GERD (gastroesophageal reflux disease)   . Hypotension    limiting med titration  . NSTEMI (non-ST elevated myocardial infarction) [facility]) 2013   12/2011 with normal coronaries possibly secondary to Takotsubo (EF 30-35% by echo), occurred following two episodes of acute respiratory distress  . Pneumonia 11/2016   history of  . 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 ALPRAZolam  0.25 mg Oral BID  . escitalopram  20 mg Oral Daily  . guaiFENesin  600 mg Oral BID  . ipratropium-albuterol  3 mL Nebulization QID   . methylPREDNISolone (SOLU-MEDROL) injection  40 mg Intravenous Q8H  . mirtazapine  30 mg Oral QHS  . montelukast  10 mg Oral QHS    Assessment: 64 year old female presenting with sudden onset shortness of breath. PMH includes COPD, anxiety, GERD, NSTEMI 2013, and Takotsubo syndrome. High-sensitivity troponin 101. Pharmacy consulted to dose heparin for ACS  Goal of Therapy:  Heparin level 0.3-0.7 units/ml Monitor platelets by anticoagulation protocol: Yes   Plan:  09/11 @ 0030 HL 0.38 therapeutic.  09/11 @ 0655 HL 0.35 therapeutic x 2  Will continue current rate (700 units/hr) and will recheck HL/CBC daily with am labs  11/11, PharmD, BCPS Clinical Pharmacist 07/10/2020 8:08 AM

## 2020-07-11 LAB — CBC
HCT: 34.8 % — ABNORMAL LOW (ref 36.0–46.0)
Hemoglobin: 11.3 g/dL — ABNORMAL LOW (ref 12.0–15.0)
MCH: 31.9 pg (ref 26.0–34.0)
MCHC: 32.5 g/dL (ref 30.0–36.0)
MCV: 98.3 fL (ref 80.0–100.0)
Platelets: 249 10*3/uL (ref 150–400)
RBC: 3.54 MIL/uL — ABNORMAL LOW (ref 3.87–5.11)
RDW: 13.7 % (ref 11.5–15.5)
WBC: 10.5 10*3/uL (ref 4.0–10.5)
nRBC: 0 % (ref 0.0–0.2)

## 2020-07-11 LAB — BASIC METABOLIC PANEL
Anion gap: 9 (ref 5–15)
BUN: 10 mg/dL (ref 8–23)
CO2: 26 mmol/L (ref 22–32)
Calcium: 8.2 mg/dL — ABNORMAL LOW (ref 8.9–10.3)
Chloride: 106 mmol/L (ref 98–111)
Creatinine, Ser: 0.61 mg/dL (ref 0.44–1.00)
GFR calc Af Amer: >60 mL/min (ref >60)
GFR calc non Af Amer: >60 mL/min (ref >60)
Glucose, Bld: 155 mg/dL — ABNORMAL HIGH (ref 70–99)
Potassium: 4.3 mmol/L (ref 3.5–5.1)
Sodium: 141 mmol/L (ref 135–145)

## 2020-07-11 LAB — TROPONIN I (HIGH SENSITIVITY): Troponin I (High Sensitivity): 41 ng/L — ABNORMAL HIGH (ref <18)

## 2020-07-11 LAB — HEPARIN LEVEL (UNFRACTIONATED): Heparin Unfractionated: <0.1 IU/mL — ABNORMAL LOW (ref 0.30–0.70)

## 2020-07-11 MED ORDER — AZITHROMYCIN 250 MG PO TABS
500.0000 mg | ORAL_TABLET | Freq: Every day | ORAL | Status: DC
  Administered 2020-07-11: 500 mg via ORAL
  Filled 2020-07-11: qty 2

## 2020-07-11 MED ORDER — ENOXAPARIN SODIUM 40 MG/0.4ML ~~LOC~~ SOLN
40.0000 mg | SUBCUTANEOUS | Status: DC
  Administered 2020-07-11 – 2020-07-12 (×2): 40 mg via SUBCUTANEOUS
  Filled 2020-07-11 (×2): qty 0.4

## 2020-07-11 MED ORDER — SODIUM CHLORIDE 0.9 % IV SOLN
INTRAVENOUS | Status: DC | PRN
  Administered 2020-07-11: 1000 mL via INTRAVENOUS

## 2020-07-11 NOTE — Progress Notes (Signed)
PHARMACIST - PHYSICIAN COMMUNICATION DR:   Mayford Knife CONCERNING: Antibiotic IV to Oral Route Change Policy  RECOMMENDATION: This patient is receiving azithromycin by the intravenous route.  Based on criteria approved by the Pharmacy and Therapeutics Committee, the antibiotic(s) is/are being converted to the equivalent oral dose form(s).   DESCRIPTION: These criteria include:  Patient being treated for a respiratory tract infection, urinary tract infection, cellulitis or clostridium difficile associated diarrhea if on metronidazole  The patient is not neutropenic and does not exhibit a GI malabsorption state  The patient is eating (either orally or via tube) and/or has been taking other orally administered medications for a least 24 hours  The patient is improving clinically and has a Tmax < 100.5  If you have questions about this conversion, please contact the Pharmacy Department  Albina Billet, PharmD, BCPS Clinical Pharmacist 07/11/2020 11:31 AM

## 2020-07-11 NOTE — Progress Notes (Signed)
[facility] Cardiology    SUBJECTIVE: The best overall patient states to be doing reasonably well denies any significant worsening shortness of breath no chest pain resting comfortably would like to start getting out of bed and sitting up more in bed now..   Vitals:   07/10/20 1146 07/10/20 1537 07/10/20 2040 07/11/20 0440  BP: (!) 130/58  138/68 139/84  Pulse: 90  99 100  Resp: 18  17 19   Temp: (!) 97.5 F (36.4 C)  97.8 F (36.6 C) 97.9 F (36.6 C)  TempSrc: Oral  Oral Oral  SpO2: 99% 97% 100% 97%  Weight:      Height:         Intake/Output Summary (Last 24 hours) at 07/11/2020 1035 Last data filed at 07/11/2020 0417 Gross per 24 hour  Intake 1068.63 ml  Output 800 ml  Net 268.63 ml      PHYSICAL EXAM  General: Well developed, well nourished, in no acute distress HEENT:  Normocephalic and atramatic Neck:  No JVD.  Lungs: Clear bilaterally to auscultation and percussion. Heart: HRRR . Normal S1 and S2 without gallops or murmurs.  Abdomen: Bowel sounds are positive, abdomen soft and non-tender  Msk:  Back normal, normal gait. Normal strength and tone for age. Extremities: No clubbing, cyanosis or edema.   Neuro: Alert and oriented X 3. Psych:  Good affect, responds appropriately   LABS: Basic Metabolic Panel: Recent Labs    07/10/20 0434 07/11/20 0642  NA 141 141  K 4.3 4.3  CL 105 106  CO2 28 26  GLUCOSE 154* 155*  BUN 10 10  CREATININE 0.66 0.61  CALCIUM 8.3* 8.2*   Liver Function Tests: Recent Labs    07/09/20 1251  AST 33  ALT 29  ALKPHOS 141*  BILITOT 0.7  PROT 8.4*  ALBUMIN 4.4   No results for input(s): LIPASE, AMYLASE in the last 72 hours. CBC: Recent Labs    07/09/20 1251 07/09/20 1251 07/10/20 0434 07/11/20 0642  WBC 7.8   < > 5.3 10.5  NEUTROABS 5.5  --   --   --   HGB 13.8   < > 12.2 11.3*  HCT 40.4   < > 35.4* 34.8*  MCV 92.9   < > 93.4 98.3  PLT 352   < > 263 249   < > = values in this interval not displayed.   Cardiac  Enzymes: No results for input(s): CKTOTAL, CKMB, CKMBINDEX, TROPONINI in the last 72 hours. BNP: Invalid input(s): POCBNP D-Dimer: No results for input(s): DDIMER in the last 72 hours. Hemoglobin A1C: No results for input(s): HGBA1C in the last 72 hours. Fasting Lipid Panel: No results for input(s): CHOL, HDL, LDLCALC, TRIG, CHOLHDL, LDLDIRECT in the last 72 hours. Thyroid Function Tests: No results for input(s): TSH, T4TOTAL, T3FREE, THYROIDAB in the last 72 hours.  Invalid input(s): FREET3 Anemia Panel: No results for input(s): VITAMINB12, FOLATE, FERRITIN, TIBC, IRON, RETICCTPCT in the last 72 hours.  CT Angio Chest PE W and/or Wo Contrast  Result Date: 07/09/2020 CLINICAL DATA:  Shortness of breath, elevated D dimer EXAM: CT ANGIOGRAPHY CHEST WITH CONTRAST TECHNIQUE: Multidetector CT imaging of the chest was performed using the standard protocol during bolus administration of intravenous contrast. Multiplanar CT image reconstructions and MIPs were obtained to evaluate the vascular anatomy. CONTRAST:  48mL OMNIPAQUE IOHEXOL 350 MG/ML SOLN COMPARISON:  05/07/2019, 07/09/2020 FINDINGS: Cardiovascular: This is a technically adequate evaluation of the pulmonary vasculature. No filling defects or pulmonary emboli. The  heart is unremarkable without pericardial effusion. 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 There is extensive atherosclerosis of the aortic arch. Mediastinum/Nodes: No enlarged mediastinal, hilar, or axillary lymph nodes. Thyroid gland, trachea, and esophagus demonstrate no significant findings. Lungs/Pleura: There is severe background emphysema, with chronic right apical subpleural scarring. No airspace disease, effusion, or pneumothorax. Central airways are patent. Upper Abdomen: No acute abnormality. Musculoskeletal: No acute or destructive bony lesions. Reconstructed images demonstrate no additional findings. Review of the MIP  images confirms the above findings. IMPRESSION: 1. No evidence of pulmonary embolus. 2. No acute intrathoracic process. 3. Aortic Atherosclerosis (ICD10-I70.0) and Emphysema (ICD10-J43.9). Electronically Signed   By: Sharlet Salina M.D.   On: 07/09/2020 16:10   DG Chest Portable 1 View  Result Date: 07/09/2020 CLINICAL DATA:  Shortness of breath EXAM: PORTABLE CHEST 1 VIEW COMPARISON:  January 13, 2020 chest radiograph and chest CT May 07 2019 FINDINGS: There is underlying emphysematous change, better delineated on CT. There is scarring in the upper lobe regions, stable. There is also scarring in the lateral right base. There is no appreciable edema or airspace opacity. Slight interstitial prominence in the left mid and lower lung zones compared to other regions is stable and likely due to redistribution of blood flow to viable lung segments. The heart size is normal. Pulmonary vascularity is stable and reflects underlying emphysematous change. No adenopathy. No bone lesions. IMPRESSION: Underlying emphysematous changes with areas of scarring. No edema or airspace opacity. Stable cardiac silhouette. Electronically Signed   By: Bretta Bang III M.D.   On: 07/09/2020 13:21   ECHOCARDIOGRAM COMPLETE  Result Date: 07/10/2020    ECHOCARDIOGRAM REPORT   Patient Name:   Heidi [name] Date of Exam: 07/10/2020 Medical Rec #:  [id number]        Height:       65.0 in Accession #:    [id number]       Weight:       130.0 lb Date of Birth:  03-13-1956        BSA:          1.647 m Patient Age:    64 years         BP:           134/80 mmHg Patient Gender: F                HR:           94 bpm. Exam Location:  [facility] Procedure: 2D Echo Indications:     Elevated Troponin  History:         Patient has no prior history of Echocardiogram examinations.  Sonographer:     Wonda Cerise RDCS Referring Phys:  [id number] Vernetta Honey MANSY Diagnosing Phys: Adrian Blackwater MD  Sonographer Comments: Technically difficult study due to poor echo  windows. IMPRESSIONS  1. Left ventricular ejection fraction, by estimation, is 50 to 55%. The left ventricle has low normal function. The left ventricle has no regional wall motion abnormalities. Left ventricular diastolic parameters are consistent with Grade I diastolic dysfunction (impaired relaxation).  2. Right ventricular systolic function is normal. The right ventricular size is normal.  3. The mitral valve is normal in structure. No evidence of mitral valve regurgitation. No evidence of mitral stenosis.  4. The aortic valve is normal in structure. Aortic valve regurgitation is trivial. No aortic stenosis is present.  5. The inferior vena cava is normal in size with  greater than 50% respiratory variability, suggesting right atrial pressure of 3 mmHg. FINDINGS  Left Ventricle: Left ventricular ejection fraction, by estimation, is 50 to 55%. The left ventricle has low normal function. The left ventricle has no regional wall motion abnormalities. The left ventricular internal cavity size was normal in size. There is no left ventricular hypertrophy. Left ventricular diastolic parameters are consistent with Grade I diastolic dysfunction (impaired relaxation). Right Ventricle: The right ventricular size is normal. No increase in right ventricular wall thickness. Right ventricular systolic function is normal. Left Atrium: Left atrial size was normal in size. Right Atrium: Right atrial size was normal in size. Pericardium: There is no evidence of pericardial effusion. Mitral Valve: The mitral valve is normal in structure. No evidence of mitral valve regurgitation. No evidence of mitral valve stenosis. Tricuspid Valve: The tricuspid valve is normal in structure. Tricuspid valve regurgitation is trivial. No evidence of tricuspid stenosis. Aortic Valve: The aortic valve is normal in structure. Aortic valve regurgitation is trivial. Aortic regurgitation PHT measures 528 msec. No aortic stenosis is present. Aortic valve  mean gradient measures 5.0 mmHg. Aortic valve peak gradient measures 8.5  mmHg. Aortic valve area, by VTI measures 2.37 cm. Pulmonic Valve: The pulmonic valve was normal in structure. Pulmonic valve regurgitation is trivial. No evidence of pulmonic stenosis. Aorta: The aortic root is normal in size and structure. Venous: The inferior vena cava is normal in size with greater than 50% respiratory variability, suggesting right atrial pressure of 3 mmHg. IAS/Shunts: No atrial level shunt detected by color flow Doppler.  LEFT VENTRICLE PLAX 2D LVIDd:         4.55 cm  Diastology LVIDs:         3.24 cm  LV e' medial:    7.83 cm/s LV PW:         1.06 cm  LV E/e' medial:  9.0 LV IVS:        0.96 cm  LV e' lateral:   9.14 cm/s LVOT diam:     2.00 cm  LV E/e' lateral: 7.7 LV SV:         69 LV SV Index:   42 LVOT Area:     3.14 cm  RIGHT VENTRICLE RV Basal diam:  2.82 cm RV S prime:     18.80 cm/s TAPSE (M-mode): 1.6 cm LEFT ATRIUM           Index       RIGHT ATRIUM          Index LA diam:      2.60 cm 1.58 cm/m  RA Area:     6.62 cm LA Vol (A2C): 14.9 ml 9.05 ml/m  RA Volume:   12.50 ml 7.59 ml/m LA Vol (A4C): 20.2 ml 12.26 ml/m  AORTIC VALVE AV Area (Vmax):    2.47 cm AV Area (Vmean):   2.10 cm AV Area (VTI):     2.37 cm AV Vmax:           146.00 cm/s AV Vmean:          109.000 cm/s AV VTI:            0.292 m AV Peak Grad:      8.5 mmHg AV Mean Grad:      5.0 mmHg LVOT Vmax:         115.00 cm/s LVOT Vmean:        72.900 cm/s LVOT VTI:  0.220 m LVOT/AV VTI ratio: 0.75 AI PHT:            528 msec  AORTA Ao Root diam: 3.20 cm Ao Asc diam:  2.80 cm MITRAL VALVE MV Area (PHT): 4.80 cm     SHUNTS MV Decel Time: 158 msec     Systemic VTI:  0.22 m MV E velocity: 70.30 cm/s   Systemic Diam: 2.00 cm MV A velocity: 114.00 cm/s MV E/A ratio:  0.62 Adrian Blackwater MD Electronically signed by Adrian Blackwater MD Signature Date/Time: 07/10/2020/7:30:58 PM    Final      Echo preserved left ventricular function EF around  60%  TELEMETRY: Normal sinus rhythm borderline tachycardia rate of around 100:  ASSESSMENT AND PLAN:  Active Problems:   COPD exacerbation (HCC) Shortness of breath Generalized weakness likely GERD Mild cough congestion Previous history of smoking  Plan Continue supplemental oxygen as well as inhalers  Continue broad-spectrum antibiotic therapy for possible bronchitis Continue Trelegy and other inhalers including Singulair GERD continue current therapy DVT prophylaxis with  Shortness of breath recommend supportive therapy from a respiratory standpoint Maintain with therapy for cough and congestion inhalers  Advised to refrain from  Smoking Recommend Mucinex decongestants as needed as an outpatient Have the patient follow-up with cardiology as an outpatient  Alwyn Pea, MD 07/11/2020 10:35 AM

## 2020-07-11 NOTE — Progress Notes (Signed)
PROGRESS NOTE    Heidi [name]  FSE:[id number] DOB: 05-17-1956 DOA: 07/09/2020 PCP: Patrice Paradise, MD   Assessment & Plan:   Active Problems:   COPD exacerbation (HCC)   COPD exacerbation: continue on IV rocephin, azithromycin, IV steroids, singulair & bronchodilators. Encourage incentive spirometry & flutter valve. Initially on BiPAP which has since been weaned off & currently on 2.5L [place]. Continue on tussionex for cough.  Acute hypoxic respiratory failure: secondary to COPD exacerbation. Continue on supplemental oxygen and wean as tolerated. Management as stated above   Chest tightness: w/ elevated troponin. Likely secondary to demand ischemia. Morphine, nitro prn. Echo shows EF 50-55%, grade I diastolic function & no wall motion abnormalities. Continue on tele. Cardio consulted   Anxiety: severity unknown. Continue on xanax  Likely depression: severity unknown. Continue on home dose of lexapro   Insomnia: continue on home dose of remeron. Benadryl prn       DVT prophylaxis: lovenox  Code Status: full  Family Communication: discussed pt's care w/ pt's family at bedside and answered their questions  Disposition Plan: likely d/c back home  Status is: Inpatient  Remains inpatient appropriate because:IV treatments appropriate due to intensity of illness or inability to take PO   Dispo: The patient is from: Home              Anticipated d/c is to: Home              Anticipated d/c date is: 3 days              Patient currently is not medically stable to d/c.         Consultants:   Cardio    Procedures:    Antimicrobials:    Subjective: Pt c/o shortness of breath that is unchanged from day prior.   Objective: Vitals:   07/10/20 1146 07/10/20 1537 07/10/20 2040 07/11/20 0440  BP: (!) 130/58  138/68 139/84  Pulse: 90  99 100  Resp: 18  17 19   Temp: (!) 97.5 F (36.4 C)  97.8 F (36.6 C) 97.9 F (36.6 C)  TempSrc: Oral  Oral Oral  SpO2:  99% 97% 100% 97%  Weight:      Height:        Intake/Output Summary (Last 24 hours) at 07/11/2020 0757 Last data filed at 07/11/2020 0417 Gross per 24 hour  Intake 1068.63 ml  Output 1400 ml  Net -331.37 ml   Filed Weights   07/09/20 1247  Weight: 59 kg    Examination:  General exam: Appears calm and comfortable  Respiratory system: course breath sounds b/l. Cardiovascular system: S1 & S2+. No  rubs, gallops or clicks.  Gastrointestinal system: Abdomen is nondistended, soft and nontender. Normal bowel sounds heard. Central nervous system: Alert and oriented. Moves all 4 extremities  Psychiatry: Judgement and insight appear normal. Mood & affect appropriate.     Data Reviewed: I have personally reviewed following labs and imaging studies  CBC: Recent Labs  Lab 07/09/20 1251 07/10/20 0434 07/11/20 0642  WBC 7.8 5.3 10.5  NEUTROABS 5.5  --   --   HGB 13.8 12.2 11.3*  HCT 40.4 35.4* 34.8*  MCV 92.9 93.4 98.3  PLT 352 263 249   Basic Metabolic Panel: Recent Labs  Lab 07/09/20 1251 07/10/20 0434 07/11/20 0642  NA 140 141 141  K 4.4 4.3 4.3  CL 101 105 106  CO2 29 28 26   GLUCOSE 141* 154* 155*  BUN 10 10  10  CREATININE 0.79 0.66 0.61  CALCIUM 8.9 8.3* 8.2*   GFR: Estimated Creatinine Clearance: 63.9 mL/min (by C-G formula based on SCr of 0.61 mg/dL). Liver Function Tests: Recent Labs  Lab 07/09/20 1251  AST 33  ALT 29  ALKPHOS 141*  BILITOT 0.7  PROT 8.4*  ALBUMIN 4.4   No results for input(s): LIPASE, AMYLASE in the last 168 hours. No results for input(s): AMMONIA in the last 168 hours. Coagulation Profile: Recent Labs  Lab 07/09/20 1733  INR 1.0   Cardiac Enzymes: No results for input(s): CKTOTAL, CKMB, CKMBINDEX, TROPONINI in the last 168 hours. BNP (last 3 results) No results for input(s): PROBNP in the last 8760 hours. HbA1C: No results for input(s): HGBA1C in the last 72 hours. CBG: No results for input(s): GLUCAP in the last 168  hours. Lipid Profile: No results for input(s): CHOL, HDL, LDLCALC, TRIG, CHOLHDL, LDLDIRECT in the last 72 hours. Thyroid Function Tests: No results for input(s): TSH, T4TOTAL, FREET4, T3FREE, THYROIDAB in the last 72 hours. Anemia Panel: No results for input(s): VITAMINB12, FOLATE, FERRITIN, TIBC, IRON, RETICCTPCT in the last 72 hours. Sepsis Labs: Recent Labs  Lab 07/09/20 1251 07/09/20 1441  LATICACIDVEN 1.2 0.8    Recent Results (from the past 240 hour(s))  SARS Coronavirus 2 by RT PCR (hospital order, performed in [facility] hospital lab) Nasopharyngeal Nasopharyngeal Swab     Status: None   Collection Time: 07/09/20  2:04 PM   Specimen: Nasopharyngeal Swab  Result Value Ref Range Status   SARS Coronavirus 2 NEGATIVE NEGATIVE Final    Comment: (NOTE) SARS-CoV-2 target nucleic acids are NOT DETECTED.  The SARS-CoV-2 RNA is generally detectable in upper and lower respiratory specimens during the acute phase of infection. The lowest concentration of SARS-CoV-2 viral copies this assay can detect is 250 copies / mL. A negative result does not preclude SARS-CoV-2 infection and should not be used as the sole basis for treatment or other patient management decisions.  A negative result may occur with improper specimen collection / handling, submission of specimen other than nasopharyngeal swab, presence of viral mutation(s) within the areas targeted by this assay, and inadequate number of viral copies (<250 copies / mL). A negative result must be combined with clinical observations, patient history, and epidemiological information.  Fact Sheet for Patients:   BoilerBrush.com.cy  Fact Sheet for Healthcare Providers: https://pope.com/  This test is not yet approved or  cleared by the [place] FDA and has been authorized for detection and/or diagnosis of SARS-CoV-2 by FDA under an Emergency Use Authorization (EUA).  This EUA  will remain in effect (meaning this test can be used) for the duration of the COVID-19 declaration under Section 564(b)(1) of the Act, 21 U.S.C. section 360bbb-3(b)(1), unless the authorization is terminated or revoked sooner.  Performed at [facility], [address] [place], [place] [address]          Radiology Studies: CT Angio Chest PE W and/or Wo Contrast  Result Date: 07/09/2020 CLINICAL DATA:  Shortness of breath, elevated D dimer EXAM: CT ANGIOGRAPHY CHEST WITH CONTRAST TECHNIQUE: Multidetector CT imaging of the chest was performed using the standard protocol during bolus administration of intravenous contrast. Multiplanar CT image reconstructions and MIPs were obtained to evaluate the vascular anatomy. CONTRAST:  76mL OMNIPAQUE IOHEXOL 350 MG/ML SOLN COMPARISON:  05/07/2019, 07/09/2020 FINDINGS: Cardiovascular: This is a technically adequate evaluation of the pulmonary vasculature. No filling defects or pulmonary emboli. The heart is unremarkable without pericardial  effusion. 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 There is extensive atherosclerosis of the aortic arch. Mediastinum/Nodes: No enlarged mediastinal, hilar, or axillary lymph nodes. Thyroid gland, trachea, and esophagus demonstrate no significant findings. Lungs/Pleura: There is severe background emphysema, with chronic right apical subpleural scarring. No airspace disease, effusion, or pneumothorax. Central airways are patent. Upper Abdomen: No acute abnormality. Musculoskeletal: No acute or destructive bony lesions. Reconstructed images demonstrate no additional findings. Review of the MIP images confirms the above findings. IMPRESSION: 1. No evidence of pulmonary embolus. 2. No acute intrathoracic process. 3. Aortic Atherosclerosis (ICD10-I70.0) and Emphysema (ICD10-J43.9). Electronically Signed   By: Sharlet Salina M.D.   On: 07/09/2020 16:10   DG Chest Portable  1 View  Result Date: 07/09/2020 CLINICAL DATA:  Shortness of breath EXAM: PORTABLE CHEST 1 VIEW COMPARISON:  January 13, 2020 chest radiograph and chest CT May 07 2019 FINDINGS: There is underlying emphysematous change, better delineated on CT. There is scarring in the upper lobe regions, stable. There is also scarring in the lateral right base. There is no appreciable edema or airspace opacity. Slight interstitial prominence in the left mid and lower lung zones compared to other regions is stable and likely due to redistribution of blood flow to viable lung segments. The heart size is normal. Pulmonary vascularity is stable and reflects underlying emphysematous change. No adenopathy. No bone lesions. IMPRESSION: Underlying emphysematous changes with areas of scarring. No edema or airspace opacity. Stable cardiac silhouette. Electronically Signed   By: Bretta Bang III M.D.   On: 07/09/2020 13:21   ECHOCARDIOGRAM COMPLETE  Result Date: 07/10/2020    ECHOCARDIOGRAM REPORT   Patient Name:   Heidi [name] Date of Exam: 07/10/2020 Medical Rec #:  [id number]        Height:       65.0 in Accession #:    [id number]       Weight:       130.0 lb Date of Birth:  1955-11-20        BSA:          1.647 m Patient Age:    64 years         BP:           134/80 mmHg Patient Gender: F                HR:           94 bpm. Exam Location:  [facility] Procedure: 2D Echo Indications:     Elevated Troponin  History:         Patient has no prior history of Echocardiogram examinations.  Sonographer:     Wonda Cerise RDCS Referring Phys:  [id number] Vernetta Honey MANSY Diagnosing Phys: Adrian Blackwater MD  Sonographer Comments: Technically difficult study due to poor echo windows. IMPRESSIONS  1. Left ventricular ejection fraction, by estimation, is 50 to 55%. The left ventricle has low normal function. The left ventricle has no regional wall motion abnormalities. Left ventricular diastolic parameters are consistent with Grade I diastolic dysfunction  (impaired relaxation).  2. Right ventricular systolic function is normal. The right ventricular size is normal.  3. The mitral valve is normal in structure. No evidence of mitral valve regurgitation. No evidence of mitral stenosis.  4. The aortic valve is normal in structure. Aortic valve regurgitation is trivial. No aortic stenosis is present.  5. The inferior vena cava is normal in size with greater than 50% respiratory variability,  suggesting right atrial pressure of 3 mmHg. FINDINGS  Left Ventricle: Left ventricular ejection fraction, by estimation, is 50 to 55%. The left ventricle has low normal function. The left ventricle has no regional wall motion abnormalities. The left ventricular internal cavity size was normal in size. There is no left ventricular hypertrophy. Left ventricular diastolic parameters are consistent with Grade I diastolic dysfunction (impaired relaxation). Right Ventricle: The right ventricular size is normal. No increase in right ventricular wall thickness. Right ventricular systolic function is normal. Left Atrium: Left atrial size was normal in size. Right Atrium: Right atrial size was normal in size. Pericardium: There is no evidence of pericardial effusion. Mitral Valve: The mitral valve is normal in structure. No evidence of mitral valve regurgitation. No evidence of mitral valve stenosis. Tricuspid Valve: The tricuspid valve is normal in structure. Tricuspid valve regurgitation is trivial. No evidence of tricuspid stenosis. Aortic Valve: The aortic valve is normal in structure. Aortic valve regurgitation is trivial. Aortic regurgitation PHT measures 528 msec. No aortic stenosis is present. Aortic valve mean gradient measures 5.0 mmHg. Aortic valve peak gradient measures 8.5  mmHg. Aortic valve area, by VTI measures 2.37 cm. Pulmonic Valve: The pulmonic valve was normal in structure. Pulmonic valve regurgitation is trivial. No evidence of pulmonic stenosis. Aorta: The aortic root is  normal in size and structure. Venous: The inferior vena cava is normal in size with greater than 50% respiratory variability, suggesting right atrial pressure of 3 mmHg. IAS/Shunts: No atrial level shunt detected by color flow Doppler.  LEFT VENTRICLE PLAX 2D LVIDd:         4.55 cm  Diastology LVIDs:         3.24 cm  LV e' medial:    7.83 cm/s LV PW:         1.06 cm  LV E/e' medial:  9.0 LV IVS:        0.96 cm  LV e' lateral:   9.14 cm/s LVOT diam:     2.00 cm  LV E/e' lateral: 7.7 LV SV:         69 LV SV Index:   42 LVOT Area:     3.14 cm  RIGHT VENTRICLE RV Basal diam:  2.82 cm RV S prime:     18.80 cm/s TAPSE (M-mode): 1.6 cm LEFT ATRIUM           Index       RIGHT ATRIUM          Index LA diam:      2.60 cm 1.58 cm/m  RA Area:     6.62 cm LA Vol (A2C): 14.9 ml 9.05 ml/m  RA Volume:   12.50 ml 7.59 ml/m LA Vol (A4C): 20.2 ml 12.26 ml/m  AORTIC VALVE AV Area (Vmax):    2.47 cm AV Area (Vmean):   2.10 cm AV Area (VTI):     2.37 cm AV Vmax:           146.00 cm/s AV Vmean:          109.000 cm/s AV VTI:            0.292 m AV Peak Grad:      8.5 mmHg AV Mean Grad:      5.0 mmHg LVOT Vmax:         115.00 cm/s LVOT Vmean:        72.900 cm/s LVOT VTI:          0.220 m LVOT/AV VTI ratio:  0.75 AI PHT:            528 msec  AORTA Ao Root diam: 3.20 cm Ao Asc diam:  2.80 cm MITRAL VALVE MV Area (PHT): 4.80 cm     SHUNTS MV Decel Time: 158 msec     Systemic VTI:  0.22 m MV E velocity: 70.30 cm/s   Systemic Diam: 2.00 cm MV A velocity: 114.00 cm/s MV E/A ratio:  0.62 Adrian Blackwater MD Electronically signed by Adrian Blackwater MD Signature Date/Time: 07/10/2020/7:30:58 PM    Final         Scheduled Meds:  ALPRAZolam  0.25 mg Oral BID   chlorpheniramine-HYDROcodone  5 mL Oral Q12H   escitalopram  20 mg Oral Daily   guaiFENesin  600 mg Oral BID   ipratropium-albuterol  3 mL Nebulization QID   methylPREDNISolone (SOLU-MEDROL) injection  40 mg Intravenous Q8H   mirtazapine  30 mg Oral QHS   montelukast  10  mg Oral QHS   Continuous Infusions:  sodium chloride 100 mL/hr at 07/11/20 0417   azithromycin 500 mg (07/10/20 2343)   cefTRIAXone (ROCEPHIN)  IV Stopped (07/10/20 2239)     LOS: 2 days    Time spent: 31 mins     Charise Killian, MD [facility] Pager [phone]  If 7PM-7AM, please contact night-coverage [email or web address] 07/11/2020, 7:57 AM

## 2020-07-12 DIAGNOSIS — F3289 Other specified depressive episodes: Secondary | ICD-10-CM

## 2020-07-12 LAB — CBC
HCT: 36 % (ref 36.0–46.0)
Hemoglobin: 11.6 g/dL — ABNORMAL LOW (ref 12.0–15.0)
MCH: 31.5 pg (ref 26.0–34.0)
MCHC: 32.2 g/dL (ref 30.0–36.0)
MCV: 97.8 fL (ref 80.0–100.0)
Platelets: 272 10*3/uL (ref 150–400)
RBC: 3.68 MIL/uL — ABNORMAL LOW (ref 3.87–5.11)
RDW: 13.8 % (ref 11.5–15.5)
WBC: 8.7 10*3/uL (ref 4.0–10.5)
nRBC: 0 % (ref 0.0–0.2)

## 2020-07-12 LAB — BASIC METABOLIC PANEL
Anion gap: 8 (ref 5–15)
BUN: 11 mg/dL (ref 8–23)
CO2: 32 mmol/L (ref 22–32)
Calcium: 8.8 mg/dL — ABNORMAL LOW (ref 8.9–10.3)
Chloride: 100 mmol/L (ref 98–111)
Creatinine, Ser: 0.73 mg/dL (ref 0.44–1.00)
GFR calc Af Amer: >60 mL/min (ref >60)
GFR calc non Af Amer: >60 mL/min (ref >60)
Glucose, Bld: 146 mg/dL — ABNORMAL HIGH (ref 70–99)
Potassium: 4.1 mmol/L (ref 3.5–5.1)
Sodium: 140 mmol/L (ref 135–145)

## 2020-07-12 MED ORDER — PREDNISONE 20 MG PO TABS: 40.0000 mg | ORAL_TABLET | Freq: Every day | ORAL | 0 refills | Status: AC

## 2020-07-12 MED ORDER — HYDROCOD POLST-CPM POLST ER 10-8 MG/5ML PO SUER
5.0000 mL | Freq: Two times a day (BID) | ORAL | 0 refills | Status: AC | PRN
Start: 2020-07-12 — End: 2020-07-17

## 2020-07-12 MED ORDER — AZITHROMYCIN 250 MG PO TABS: ORAL_TABLET | ORAL | 0 refills | Status: DC

## 2020-07-12 NOTE — Care Management Important Message (Signed)
Important Message  Patient Details  Name: Heidi [name] MRN: [id number] Date of Birth: 1956/09/03   Medicare Important Message Given:  Yes     Johnell Comings 07/12/2020, 2:14 PM

## 2020-07-12 NOTE — Progress Notes (Signed)
Mobility Specialist - Progress Note   07/12/20 1440  Mobility  Activity  (Bed exercises)  Range of Motion/Exercises Right leg;Left leg (ankle pumps, quad sets, straight leg raises, hip iso)  Level of Assistance Independent  Assistive Device None  Distance Ambulated (ft) 0 ft  Mobility Response Tolerated well  Mobility performed by Mobility specialist  $Mobility charge 1 Mobility    Pre-mobility: 88 HR, 135 /77 BP, 97% SpO2 Post-mobility: 97 HR, 149/75 BP, 95% SpO2   Pt was lying in bed utilizing 3L HFNC upon arrival. Pt agreed to session. Pt stated that she had just finished walking with RN, nurse confirmed, so she was not up for OOB mobility. Pt performed bed exercises: straight leg raises, quad sets, ankle pumps, hip isometrics, and hip add/abd (10x/leg). Overall, pt tolerated session well. Pt was left in bed with all needs in reach.    Filiberto Pinks Mobility Specialist 07/12/20, 2:44 PM

## 2020-07-12 NOTE — Discharge Summary (Signed)
Physician Discharge Summary  Heidi [name] MWU:[id number] DOB: 02-06-1956 DOA: 07/09/2020  PCP: Patrice Paradise, MD  Admit date: 07/09/2020 Discharge date: 07/12/2020  Admitted From: home Disposition: home  Recommendations for Outpatient Follow-up:  1. Follow up with PCP in 1-2 weeks 2. F/u cardio in 1-2 weeks  Home Health: no  Equipment/Devices: chronically on 3L [place]  Discharge Condition: stable  CODE STATUS: full Diet recommendation: regular   Brief/Interim Summary: HPI was taken from Dr. Arville Care: Heidi [name]  is a 64 y.o. Caucasian female with a known history of COPD, GERD, coronary artery disease, anxiety and Takotsubo syndrome, who presented to emergency room with acute onset of worsening dyspnea with associated dry cough and wheezing for the last week.  She admitted to chest tightness as well as occasional bilateral lateral chest wall cramps with cough as well as palpitations.  She admits to nausea without vomiting or diarrhea or abdominal pain.  No fever or chills.  No leg pain or edema recent travels or surgeries.  She stated that she has been under stress lately and has been having insomnia.  Upon presentation to the emergency room, blood pressure was 135/92 with a heart rate of 111 with initially normal other vital signs. Pulse symmetry was briefly 88% on room air and later was up to 96%. Labs revealed unremarkable CMP. BNP was 42.9 and high-sensitivity troponin I was 17 and later 1 1. Lactic acid was 1.2 L 0.8. CBC was within normal. Fibrin derivatives D-dimer with 836.19. ABG showed pH 7.26 and after BiPAP 7.4, PCO2 of 71 and after BiPAP 51 and PCO2 was 81 and later 65 with bicarbonate of 31.9. COVID-19 PCR came back negative chest x-ray showed emphysematous changes with areas of scarring with no acute cardiopulmonary disease. Chest CTA revealed no evidence of pulmonary embolus. Showed no acute intrathoracic process and aortic atherosclerosis as well as emphysema.  The  patient was given 4 baby aspirin, heparin bolus and infusion, DuoNeb's x2, 2 g of IV magnesium sulfate and p.o. Xanax. She will be admitted to a medical monitored bed for further evaluation and management.  Hospital Course from Dr. Wilfred Lacy 07/10/20-07/12/20: Pt presented w/ shortness of breath and was found to have a COPD exacerbation. Pt was treated w/ IV abxs, IV steroids, bronchodilators, incentive spirometry and supplemental oxygen. Pt was able to be weaned back down to her normal level of 3L [place] prior to d/c. Pt was d/c w/ po azithromycin & steroids to finish the course at home. For more information please see previous progress notes.   Discharge Diagnoses:  Active Problems:   COPD exacerbation (HCC)  COPD exacerbation: continue on  azithromycin, steroids, singulair & bronchodilators at d/c. Encourage incentive spirometry & flutter valve. Initially on BiPAP which has since been weaned off & currently on 3L [place]. Continue on tussionex for cough.  Acute hypoxic respiratory failure: secondary to COPD exacerbation. Continue on supplemental oxygen. Management as stated above   Chest tightness: w/ elevated troponin. Likely secondary to demand ischemia. Morphine, nitro prn. Echo shows EF 50-55%, grade I diastolic function & no wall motion abnormalities. Continue on tele. Cardio following. F/u cardio in 1-2 weeks   Anxiety: severity unknown. Continue on xanax  Likely depression: severity unknown. Continue on home dose of lexapro   Insomnia: continue on home dose of remeron. Benadryl prn   Discharge Instructions  Discharge Instructions    Diet general   Complete by: As directed    Discharge instructions   Complete by: As  directed    F/u PCP in 1 week.   Increase activity slowly   Complete by: As directed      Allergies as of 07/12/2020      Reactions   Prozac [fluoxetine] Itching   Budesonide    Other reaction(s): Dizziness   Buspar [buspirone] Other (See Comments)   Ache joints    Levaquin [levofloxacin In D5w] Hives   Propranolol Other (See Comments)   dizziness   Zoloft [sertraline Hcl] Other (See Comments)   Increased anxiety      Medication List    TAKE these medications   albuterol 108 (90 Base) MCG/ACT inhaler Commonly known as: VENTOLIN HFA Inhale 1-2 puffs into the lungs every 4 (four) hours as needed for wheezing or shortness of breath.   albuterol (2.5 MG/3ML) 0.083% nebulizer solution Commonly known as: PROVENTIL Take 3 mLs (2.5 mg total) by nebulization every 6 (six) hours as needed for wheezing or shortness of breath.   ALPRAZolam 0.25 MG tablet Commonly known as: XANAX Take 0.25 mg by mouth 2 (two) times daily.   azithromycin 250 MG tablet Commonly known as: ZITHROMAX Take 500 mg daily for 2 days more   chlorpheniramine-HYDROcodone 10-8 MG/5ML Suer Commonly known as: TUSSIONEX Take 5 mLs by mouth every 12 (twelve) hours as needed for up to 5 days for cough.   Combivent Respimat 20-100 MCG/ACT Aers respimat Generic drug: Ipratropium-Albuterol INHALE 1 PUFF BY MOUTH EVERY 6 HOURS AS NEEDED FOR WHEEZING   escitalopram 20 MG tablet Commonly known as: LEXAPRO Take 20 mg by mouth daily.   fluticasone 50 MCG/ACT nasal spray Commonly known as: FLONASE USE 2 SPRAYS IN EACH NOSTRIL ONCE DAILY   mirtazapine 30 MG tablet Commonly known as: REMERON Take 30 mg by mouth at bedtime.   montelukast 10 MG tablet Commonly known as: SINGULAIR Take 10 mg by mouth at bedtime.   predniSONE 20 MG tablet Commonly known as: Deltasone Take 2 tablets (40 mg total) by mouth daily for 5 days.   Trelegy Ellipta 100-62.5-25 MCG/INH Aepb Generic drug: Fluticasone-Umeclidin-Vilant Inhale 1 puff into the lungs daily.       Allergies  Allergen Reactions  . Prozac [Fluoxetine] Itching  . Budesonide     Other reaction(s): Dizziness  . Buspar [Buspirone] Other (See Comments)    Ache joints  . Levaquin [Levofloxacin In D5w] Hives  . Propranolol Other  (See Comments)    dizziness  . Zoloft [Sertraline Hcl] Other (See Comments)    Increased anxiety    Consultations:  Cardio, Dr. Juliann Paresallwood   Procedures/Studies: CT Angio Chest PE W and/or Wo Contrast  Result Date: 07/09/2020 CLINICAL DATA:  Shortness of breath, elevated D dimer EXAM: CT ANGIOGRAPHY CHEST WITH CONTRAST TECHNIQUE: Multidetector CT imaging of the chest was performed using the standard protocol during bolus administration of intravenous contrast. Multiplanar CT image reconstructions and MIPs were obtained to evaluate the vascular anatomy. CONTRAST:  75mL OMNIPAQUE IOHEXOL 350 MG/ML SOLN COMPARISON:  05/07/2019, 07/09/2020 FINDINGS: Cardiovascular: This is a technically adequate evaluation of the pulmonary vasculature. No filling defects or pulmonary emboli. The heart is unremarkable without pericardial effusion. 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 There is extensive atherosclerosis of the aortic arch. Mediastinum/Nodes: No enlarged mediastinal, hilar, or axillary lymph nodes. Thyroid gland, trachea, and esophagus demonstrate no significant findings. Lungs/Pleura: There is severe background emphysema, with chronic right apical subpleural scarring. No airspace disease, effusion, or pneumothorax. Central airways are patent. Upper Abdomen: No  acute abnormality. Musculoskeletal: No acute or destructive bony lesions. Reconstructed images demonstrate no additional findings. Review of the MIP images confirms the above findings. IMPRESSION: 1. No evidence of pulmonary embolus. 2. No acute intrathoracic process. 3. Aortic Atherosclerosis (ICD10-I70.0) and Emphysema (ICD10-J43.9). Electronically Signed   By: Sharlet Salina M.D.   On: 07/09/2020 16:10   DG Chest Portable 1 View  Result Date: 07/09/2020 CLINICAL DATA:  Shortness of breath EXAM: PORTABLE CHEST 1 VIEW COMPARISON:  January 13, 2020 chest radiograph and chest CT May 07 2019 FINDINGS:  There is underlying emphysematous change, better delineated on CT. There is scarring in the upper lobe regions, stable. There is also scarring in the lateral right base. There is no appreciable edema or airspace opacity. Slight interstitial prominence in the left mid and lower lung zones compared to other regions is stable and likely due to redistribution of blood flow to viable lung segments. The heart size is normal. Pulmonary vascularity is stable and reflects underlying emphysematous change. No adenopathy. No bone lesions. IMPRESSION: Underlying emphysematous changes with areas of scarring. No edema or airspace opacity. Stable cardiac silhouette. Electronically Signed   By: Bretta Bang III M.D.   On: 07/09/2020 13:21   ECHOCARDIOGRAM COMPLETE  Result Date: 07/10/2020    ECHOCARDIOGRAM REPORT   Patient Name:   Heidi [name] Date of Exam: 07/10/2020 Medical Rec #:  [id number]        Height:       65.0 in Accession #:    [id number]       Weight:       130.0 lb Date of Birth:  August 05, 1956        BSA:          1.647 m Patient Age:    64 years         BP:           134/80 mmHg Patient Gender: F                HR:           94 bpm. Exam Location:  [facility] Procedure: 2D Echo Indications:     Elevated Troponin  History:         Patient has no prior history of Echocardiogram examinations.  Sonographer:     Wonda Cerise RDCS Referring Phys:  [id number] Vernetta Honey MANSY Diagnosing Phys: Adrian Blackwater MD  Sonographer Comments: Technically difficult study due to poor echo windows. IMPRESSIONS  1. Left ventricular ejection fraction, by estimation, is 50 to 55%. The left ventricle has low normal function. The left ventricle has no regional wall motion abnormalities. Left ventricular diastolic parameters are consistent with Grade I diastolic dysfunction (impaired relaxation).  2. Right ventricular systolic function is normal. The right ventricular size is normal.  3. The mitral valve is normal in structure. No evidence of  mitral valve regurgitation. No evidence of mitral stenosis.  4. The aortic valve is normal in structure. Aortic valve regurgitation is trivial. No aortic stenosis is present.  5. The inferior vena cava is normal in size with greater than 50% respiratory variability, suggesting right atrial pressure of 3 mmHg. FINDINGS  Left Ventricle: Left ventricular ejection fraction, by estimation, is 50 to 55%. The left ventricle has low normal function. The left ventricle has no regional wall motion abnormalities. The left ventricular internal cavity size was normal in size. There is no left ventricular hypertrophy. Left ventricular diastolic parameters are consistent with Grade I diastolic dysfunction (impaired  relaxation). Right Ventricle: The right ventricular size is normal. No increase in right ventricular wall thickness. Right ventricular systolic function is normal. Left Atrium: Left atrial size was normal in size. Right Atrium: Right atrial size was normal in size. Pericardium: There is no evidence of pericardial effusion. Mitral Valve: The mitral valve is normal in structure. No evidence of mitral valve regurgitation. No evidence of mitral valve stenosis. Tricuspid Valve: The tricuspid valve is normal in structure. Tricuspid valve regurgitation is trivial. No evidence of tricuspid stenosis. Aortic Valve: The aortic valve is normal in structure. Aortic valve regurgitation is trivial. Aortic regurgitation PHT measures 528 msec. No aortic stenosis is present. Aortic valve mean gradient measures 5.0 mmHg. Aortic valve peak gradient measures 8.5  mmHg. Aortic valve area, by VTI measures 2.37 cm. Pulmonic Valve: The pulmonic valve was normal in structure. Pulmonic valve regurgitation is trivial. No evidence of pulmonic stenosis. Aorta: The aortic root is normal in size and structure. Venous: The inferior vena cava is normal in size with greater than 50% respiratory variability, suggesting right atrial pressure of 3 mmHg.  IAS/Shunts: No atrial level shunt detected by color flow Doppler.  LEFT VENTRICLE PLAX 2D LVIDd:         4.55 cm  Diastology LVIDs:         3.24 cm  LV e' medial:    7.83 cm/s LV PW:         1.06 cm  LV E/e' medial:  9.0 LV IVS:        0.96 cm  LV e' lateral:   9.14 cm/s LVOT diam:     2.00 cm  LV E/e' lateral: 7.7 LV SV:         69 LV SV Index:   42 LVOT Area:     3.14 cm  RIGHT VENTRICLE RV Basal diam:  2.82 cm RV S prime:     18.80 cm/s TAPSE (M-mode): 1.6 cm LEFT ATRIUM           Index       RIGHT ATRIUM          Index LA diam:      2.60 cm 1.58 cm/m  RA Area:     6.62 cm LA Vol (A2C): 14.9 ml 9.05 ml/m  RA Volume:   12.50 ml 7.59 ml/m LA Vol (A4C): 20.2 ml 12.26 ml/m  AORTIC VALVE AV Area (Vmax):    2.47 cm AV Area (Vmean):   2.10 cm AV Area (VTI):     2.37 cm AV Vmax:           146.00 cm/s AV Vmean:          109.000 cm/s AV VTI:            0.292 m AV Peak Grad:      8.5 mmHg AV Mean Grad:      5.0 mmHg LVOT Vmax:         115.00 cm/s LVOT Vmean:        72.900 cm/s LVOT VTI:          0.220 m LVOT/AV VTI ratio: 0.75 AI PHT:            528 msec  AORTA Ao Root diam: 3.20 cm Ao Asc diam:  2.80 cm MITRAL VALVE MV Area (PHT): 4.80 cm     SHUNTS MV Decel Time: 158 msec     Systemic VTI:  0.22 m MV E velocity: 70.30 cm/s   Systemic Diam: 2.00  cm MV A velocity: 114.00 cm/s MV E/A ratio:  0.62 Adrian Blackwater MD Electronically signed by Adrian Blackwater MD Signature Date/Time: 07/10/2020/7:30:58 PM    Final        Subjective: Pt c/o cough intermittently   Discharge Exam: Vitals:   07/12/20 0814 07/12/20 1135  BP:  (!) 146/86  Pulse:  90  Resp:    Temp:  98.4 F (36.9 C)  SpO2: 99% 100%   Vitals:   07/11/20 2045 07/12/20 0542 07/12/20 0814 07/12/20 1135  BP: (!) 146/79 (!) 150/90  (!) 146/86  Pulse: 98 80  90  Resp: 20 16    Temp: 97.7 F (36.5 C) 97.6 F (36.4 C)  98.4 F (36.9 C)  TempSrc: Oral Oral  Oral  SpO2: 98% 98% 99% 100%  Weight:      Height:        General: Pt is alert, awake,  not in acute distress Cardiovascular: S1/S2 +, no rubs, no gallops Respiratory: diminished breath sounds b/l. Scattered faint wheezes  Abdominal: Soft, NT, ND, bowel sounds + Extremities:  no cyanosis    The results of significant diagnostics from this hospitalization (including imaging, microbiology, ancillary and laboratory) are listed below for reference.     Microbiology: Recent Results (from the past 240 hour(s))  SARS Coronavirus 2 by RT PCR (hospital order, performed in [facility] hospital lab) Nasopharyngeal Nasopharyngeal Swab     Status: None   Collection Time: 07/09/20  2:04 PM   Specimen: Nasopharyngeal Swab  Result Value Ref Range Status   SARS Coronavirus 2 NEGATIVE NEGATIVE Final    Comment: (NOTE) SARS-CoV-2 target nucleic acids are NOT DETECTED.  The SARS-CoV-2 RNA is generally detectable in upper and lower respiratory specimens during the acute phase of infection. The lowest concentration of SARS-CoV-2 viral copies this assay can detect is 250 copies / mL. A negative result does not preclude SARS-CoV-2 infection and should not be used as the sole basis for treatment or other patient management decisions.  A negative result may occur with improper specimen collection / handling, submission of specimen other than nasopharyngeal swab, presence of viral mutation(s) within the areas targeted by this assay, and inadequate number of viral copies (<250 copies / mL). A negative result must be combined with clinical observations, patient history, and epidemiological information.  Fact Sheet for Patients:   BoilerBrush.com.cy  Fact Sheet for Healthcare Providers: https://pope.com/  This test is not yet approved or  cleared by the [place] FDA and has been authorized for detection and/or diagnosis of SARS-CoV-2 by FDA under an Emergency Use Authorization (EUA).  This EUA will remain in effect (meaning this test can  be used) for the duration of the COVID-19 declaration under Section 564(b)(1) of the Act, 21 U.S.C. section 360bbb-3(b)(1), unless the authorization is terminated or revoked sooner.  Performed at [facility], [address] [place], [place] [address]      Labs: BNP (last 3 results) Recent Labs    07/09/20 1251  BNP 42.9   Basic Metabolic Panel: Recent Labs  Lab 07/09/20 1251 07/10/20 0434 07/11/20 0642 07/12/20 0718  NA 140 141 141 140  K 4.4 4.3 4.3 4.1  CL 101 105 106 100  CO2 29 28 26  32  GLUCOSE 141* 154* 155* 146*  BUN 10 10 10 11   CREATININE 0.79 0.66 0.61 0.73  CALCIUM 8.9 8.3* 8.2* 8.8*   Liver Function Tests: Recent Labs  Lab 07/09/20 1251  AST 33  ALT 29  ALKPHOS 141*  BILITOT 0.7  PROT 8.4*  ALBUMIN 4.4   No results for input(s): LIPASE, AMYLASE in the last 168 hours. No results for input(s): AMMONIA in the last 168 hours. CBC: Recent Labs  Lab 07/09/20 1251 07/10/20 0434 07/11/20 0642 07/12/20 0718  WBC 7.8 5.3 10.5 8.7  NEUTROABS 5.5  --   --   --   HGB 13.8 12.2 11.3* 11.6*  HCT 40.4 35.4* 34.8* 36.0  MCV 92.9 93.4 98.3 97.8  PLT 352 263 249 272   Cardiac Enzymes: No results for input(s): CKTOTAL, CKMB, CKMBINDEX, TROPONINI in the last 168 hours. BNP: Invalid input(s): POCBNP CBG: No results for input(s): GLUCAP in the last 168 hours. D-Dimer No results for input(s): DDIMER in the last 72 hours. Hgb A1c No results for input(s): HGBA1C in the last 72 hours. Lipid Profile No results for input(s): CHOL, HDL, LDLCALC, TRIG, CHOLHDL, LDLDIRECT in the last 72 hours. Thyroid function studies No results for input(s): TSH, T4TOTAL, T3FREE, THYROIDAB in the last 72 hours.  Invalid input(s): FREET3 Anemia work up No results for input(s): VITAMINB12, FOLATE, FERRITIN, TIBC, IRON, RETICCTPCT in the last 72 hours. Urinalysis    Component Value Date/Time   COLORURINE STRAW (A) 12/03/2016 0101   APPEARANCEUR CLEAR (A)  12/03/2016 0101   APPEARANCEUR Clear 06/29/2013 2244   LABSPEC 1.006 12/03/2016 0101   LABSPEC 1.005 06/29/2013 2244   PHURINE 5.0 12/03/2016 0101   GLUCOSEU 150 (A) 12/03/2016 0101   GLUCOSEU Negative 06/29/2013 2244   HGBUR SMALL (A) 12/03/2016 0101   HGBUR trace-lysed 07/12/2009 1033   BILIRUBINUR NEGATIVE 12/03/2016 0101   BILIRUBINUR Negative 06/29/2013 2244   KETONESUR NEGATIVE 12/03/2016 0101   PROTEINUR NEGATIVE 12/03/2016 0101   UROBILINOGEN 0.2 08/08/2012 0107   NITRITE NEGATIVE 12/03/2016 0101   LEUKOCYTESUR NEGATIVE 12/03/2016 0101   LEUKOCYTESUR Trace 06/29/2013 2244   Sepsis Labs Invalid input(s): PROCALCITONIN,  WBC,  LACTICIDVEN Microbiology Recent Results (from the past 240 hour(s))  SARS Coronavirus 2 by RT PCR (hospital order, performed in [facility] hospital lab) Nasopharyngeal Nasopharyngeal Swab     Status: None   Collection Time: 07/09/20  2:04 PM   Specimen: Nasopharyngeal Swab  Result Value Ref Range Status   SARS Coronavirus 2 NEGATIVE NEGATIVE Final    Comment: (NOTE) SARS-CoV-2 target nucleic acids are NOT DETECTED.  The SARS-CoV-2 RNA is generally detectable in upper and lower respiratory specimens during the acute phase of infection. The lowest concentration of SARS-CoV-2 viral copies this assay can detect is 250 copies / mL. A negative result does not preclude SARS-CoV-2 infection and should not be used as the sole basis for treatment or other patient management decisions.  A negative result may occur with improper specimen collection / handling, submission of specimen other than nasopharyngeal swab, presence of viral mutation(s) within the areas targeted by this assay, and inadequate number of viral copies (<250 copies / mL). A negative result must be combined with clinical observations, patient history, and epidemiological information.  Fact Sheet for Patients:   BoilerBrush.com.cy  Fact Sheet for Healthcare  Providers: https://pope.com/  This test is not yet approved or  cleared by the [place] FDA and has been authorized for detection and/or diagnosis of SARS-CoV-2 by FDA under an Emergency Use Authorization (EUA).  This EUA will remain in effect (meaning this test can be used) for the duration of the COVID-19 declaration under Section 564(b)(1) of the Act, 21 U.S.C. section 360bbb-3(b)(1), unless the authorization is terminated or revoked  sooner.  Performed at [facility], [address] [place], [place] [address]      Time coordinating discharge: Over 30 minutes  SIGNED:   Charise Killian, MD  [facility] 07/12/2020, 2:28 PM Pager   If 7PM-7AM, please contact night-coverage

## 2020-07-12 NOTE — Progress Notes (Signed)
[facility] Cardiology    SUBJECTIVE: Patient ambulating in hall feels reasonably well denies any significant symptoms hopefully will be able to discharge soon   Vitals:   07/11/20 2045 07/12/20 0542 07/12/20 0814 07/12/20 1135  BP: (!) 146/79 (!) 150/90  (!) 146/86  Pulse: 98 80  90  Resp: 20 16    Temp: 97.7 F (36.5 C) 97.6 F (36.4 C)  98.4 F (36.9 C)  TempSrc: Oral Oral  Oral  SpO2: 98% 98% 99% 100%  Weight:      Height:         Intake/Output Summary (Last 24 hours) at 07/12/2020 1410 Last data filed at 07/12/2020 1358 Gross per 24 hour  Intake 735.41 ml  Output 2050 ml  Net -1314.59 ml      PHYSICAL EXAM  General: Well developed, well nourished, in no acute distress HEENT:  Normocephalic and atramatic Neck:  No JVD.  Lungs: Clear bilaterally to auscultation and percussion. Heart: HRRR . Normal S1 and S2 without gallops or murmurs.  Abdomen: Bowel sounds are positive, abdomen soft and non-tender  Msk:  Back normal, normal gait. Normal strength and tone for age. Extremities: No clubbing, cyanosis or edema.   Neuro: Alert and oriented X 3. Psych:  Good affect, responds appropriately   LABS: Basic Metabolic Panel: Recent Labs    07/11/20 0642 07/12/20 0718  NA 141 140  K 4.3 4.1  CL 106 100  CO2 26 32  GLUCOSE 155* 146*  BUN 10 11  CREATININE 0.61 0.73  CALCIUM 8.2* 8.8*   Liver Function Tests: No results for input(s): AST, ALT, ALKPHOS, BILITOT, PROT, ALBUMIN in the last 72 hours. No results for input(s): LIPASE, AMYLASE in the last 72 hours. CBC: Recent Labs    07/11/20 0642 07/12/20 0718  WBC 10.5 8.7  HGB 11.3* 11.6*  HCT 34.8* 36.0  MCV 98.3 97.8  PLT 249 272   Cardiac Enzymes: No results for input(s): CKTOTAL, CKMB, CKMBINDEX, TROPONINI in the last 72 hours. BNP: Invalid input(s): POCBNP D-Dimer: No results for input(s): DDIMER in the last 72 hours. Hemoglobin A1C: No results for input(s): HGBA1C in the last 72 hours. Fasting Lipid  Panel: No results for input(s): CHOL, HDL, LDLCALC, TRIG, CHOLHDL, LDLDIRECT in the last 72 hours. Thyroid Function Tests: No results for input(s): TSH, T4TOTAL, T3FREE, THYROIDAB in the last 72 hours.  Invalid input(s): FREET3 Anemia Panel: No results for input(s): VITAMINB12, FOLATE, FERRITIN, TIBC, IRON, RETICCTPCT in the last 72 hours.  No results found.   Echo preserved left ventricular function  TELEMETRY: Sinus rhythm nonspecific findings:  ASSESSMENT AND PLAN:  Active Problems:   COPD exacerbation (HCC) SOB Generalized weakness Mild cough congestion Previous history of smoking History of anxiety  Plan Increase activity Increase supplemental oxygen therapy as necessary including inhalers Continue antibiotic therapy hopefully will be to switch to p.o. Agree with inhalers for shortness of breath COPD bronchitis Continue GERD type symptoms Mucinex as necessary for decongestion  continue to advised to refrain from tobacco abuse Follow-up with cardiology 1 to 2 weeks   Alwyn Pea, MD 07/12/2020 2:10 PM

## 2020-07-22 ENCOUNTER — Ambulatory Visit
Admission: RE | Admit: 2020-07-22 | Discharge: 2020-07-22 | Disposition: A | Discharge status: Home / Self Care | Payer: Medicare Other | Source: Ambulatory Visit | Attending: Oncology | Admitting: Oncology

## 2020-07-22 ENCOUNTER — Other Ambulatory Visit: Payer: Self-pay

## 2020-07-22 DIAGNOSIS — Z122 Encounter for screening for malignant neoplasm of respiratory organs: Secondary | ICD-10-CM | POA: Diagnosis present

## 2020-07-22 DIAGNOSIS — Z87891 Personal history of nicotine dependence: Secondary | ICD-10-CM | POA: Insufficient documentation

## 2020-07-25 ENCOUNTER — Encounter: Payer: Self-pay | Admitting: *Deleted

## 2020-08-11 ENCOUNTER — Encounter: Payer: Medicare Other | Attending: Pulmonary Disease | Admitting: *Deleted

## 2020-08-11 ENCOUNTER — Encounter: Payer: Self-pay | Admitting: *Deleted

## 2020-08-11 ENCOUNTER — Other Ambulatory Visit: Payer: Self-pay

## 2020-08-11 DIAGNOSIS — J449 Chronic obstructive pulmonary disease, unspecified: Secondary | ICD-10-CM

## 2020-08-11 NOTE — Progress Notes (Signed)
Virtual orientation call completed today. shehas an appointment on Date: 08/16/2020 for EP eval and gym Orientation.  Documentation of diagnosis can be found in [facility]  Date: [id number].

## 2020-08-16 ENCOUNTER — Other Ambulatory Visit: Payer: Self-pay

## 2020-08-16 VITALS — Ht 65.0 in | Wt 149.3 lb

## 2020-08-16 DIAGNOSIS — J449 Chronic obstructive pulmonary disease, unspecified: Secondary | ICD-10-CM | POA: Diagnosis not present

## 2020-08-16 NOTE — Patient Instructions (Signed)
Patient Instructions  Patient Details  Name: Heidi [name] MRN: [id number] Date of Birth: 1956-09-05 Referring Provider:  Vida Rigger, MD  Below are your personal goals for exercise, nutrition, and risk factors. Our goal is to help you stay on track towards obtaining and maintaining these goals. We will be discussing your progress on these goals with you throughout the program.  Initial Exercise Prescription:  Initial Exercise Prescription - 08/16/20 1600      Date of Initial Exercise RX and Referring Provider   Date 08/16/20    Referring Provider Aleskerov      Treadmill   MPH 1    Grade 0    Minutes 15    METs 1.77      NuStep   Level 1    SPM 80    Minutes 15    METs 2      REL-XR   Level 1    Speed 50    Minutes 15    METs 2      Prescription Details   Frequency (times per week) 3    Duration Progress to 30 minutes of continuous aerobic without signs/symptoms of physical distress      Intensity   THRR 40-80% of Max Heartrate 123-145    Ratings of Perceived Exertion 11-15    Perceived Dyspnea 0-4      Resistance Training   Training Prescription Yes    Weight 3 lb    Reps 10-15           Exercise Goals: Frequency: Be able to perform aerobic exercise two to three times per week in program working toward 2-5 days per week of home exercise.  Intensity: Work with a perceived exertion of 11 (fairly light) - 15 (hard) while following your exercise prescription.  We will make changes to your prescription with you as you progress through the program.   Duration: Be able to do 30 to 45 minutes of continuous aerobic exercise in addition to a 5 minute warm-up and a 5 minute cool-down routine.   Nutrition Goals: Your personal nutrition goals will be established when you do your nutrition analysis with the dietician.  The following are general nutrition guidelines to follow: Cholesterol < 200mg /day Sodium < 1500mg /day Fiber: Women over 50 yrs - 21 grams per  day  Personal Goals:  Personal Goals and Risk Factors at Admission - 08/16/20 1621      Core Components/Risk Factors/Patient Goals on Admission    Weight Management Yes    Intervention Weight Management: Develop a combined nutrition and exercise program designed to reach desired caloric intake, while maintaining appropriate intake of nutrient and fiber, sodium and fats, and appropriate energy expenditure required for the weight goal.;Weight Management/Obesity: Establish reasonable short term and long term weight goals.;Weight Management: Provide education and appropriate resources to help participant work on and attain dietary goals.;Obesity: Provide education and appropriate resources to help participant work on and attain dietary goals.    Admit Weight 149 lb 4.8 oz (67.7 kg)    Goal Weight: Short Term 145 lb (65.8 kg)    Goal Weight: Long Term 130 lb (59 kg)    Expected Outcomes Short Term: Continue to assess and modify interventions until short term weight is achieved;Long Term: Adherence to nutrition and physical activity/exercise program aimed toward attainment of established weight goal;Weight Loss: Understanding of general recommendations for a balanced deficit meal plan, which promotes 1-2 lb weight loss per week and includes a negative energy balance of  [phone] kcal/d    Improve shortness of breath with ADL's Yes           Tobacco Use Initial Evaluation: Social History   Tobacco Use  Smoking Status Former Smoker  . Packs/day: 1.00  . Years: 30.00  . Pack years: 30.00  . Types: Cigarettes  . Quit date: 01/21/2012  . Years since quitting: 8.5  Smokeless Tobacco Never Used    Exercise Goals and Review:  Exercise Goals    Row Name 08/16/20 1616             Exercise Goals   Increase Physical Activity Yes       Intervention Provide advice, education, support and counseling about physical activity/exercise needs.;Develop an individualized exercise prescription for aerobic  and resistive training based on initial evaluation findings, risk stratification, comorbidities and participant's personal goals.       Expected Outcomes Short Term: Attend rehab on a regular basis to increase amount of physical activity.;Long Term: Add in home exercise to make exercise part of routine and to increase amount of physical activity.;Long Term: Exercising regularly at least 3-5 days a week.       Increase Strength and Stamina Yes       Intervention Provide advice, education, support and counseling about physical activity/exercise needs.;Develop an individualized exercise prescription for aerobic and resistive training based on initial evaluation findings, risk stratification, comorbidities and participant's personal goals.       Expected Outcomes Short Term: Increase workloads from initial exercise prescription for resistance, speed, and METs.;Short Term: Perform resistance training exercises routinely during rehab and add in resistance training at home;Long Term: Improve cardiorespiratory fitness, muscular endurance and strength as measured by increased METs and functional capacity ( )       Able to understand and use rate of perceived exertion (RPE) scale Yes       Intervention Provide education and explanation on how to use RPE scale       Expected Outcomes Short Term: Able to use RPE daily in rehab to express subjective intensity level;Long Term:  Able to use RPE to guide intensity level when exercising independently       Able to understand and use Dyspnea scale Yes       Intervention Provide education and explanation on how to use Dyspnea scale       Expected Outcomes Short Term: Able to use Dyspnea scale daily in rehab to express subjective sense of shortness of breath during exertion;Long Term: Able to use Dyspnea scale to guide intensity level when exercising independently       Knowledge and understanding of Target Heart Rate Range (THRR) Yes       Intervention Provide education  and explanation of THRR including how the numbers were predicted and where they are located for reference       Expected Outcomes Short Term: Able to state/look up THRR;Short Term: Able to use daily as guideline for intensity in rehab;Long Term: Able to use THRR to govern intensity when exercising independently       Able to check pulse independently Yes       Intervention Provide education and demonstration on how to check pulse in carotid and radial arteries.;Review the importance of being able to check your own pulse for safety during independent exercise       Expected Outcomes Short Term: Able to explain why pulse checking is important during independent exercise;Long Term: Able to check pulse independently and accurately  Understanding of Exercise Prescription Yes       Intervention Provide education, explanation, and written materials on patient's individual exercise prescription       Expected Outcomes Short Term: Able to explain program exercise prescription;Long Term: Able to explain home exercise prescription to exercise independently              Copy of goals given to participant.

## 2020-08-16 NOTE — Progress Notes (Signed)
Pulmonary Individual Treatment Plan  Patient Details  Name: Heidi [name] MRN: [id number] Date of Birth: 1956-02-18 Referring Provider:     Pulmonary Rehab from 08/16/2020 in [facility] Cardiac and Pulmonary Rehab  Referring Provider Aleskerov      Initial Encounter Date:    Pulmonary Rehab from 08/16/2020 in [facility] Cardiac and Pulmonary Rehab  Date 08/16/20      Visit Diagnosis: Chronic obstructive pulmonary disease, unspecified COPD type ([place])  Patient's Home Medications on Admission:  Current Outpatient Medications:  .  albuterol (PROVENTIL HFA;VENTOLIN HFA) 108 (90 Base) MCG/ACT inhaler, Inhale 1-2 puffs into the lungs every 4 (four) hours as needed for wheezing or shortness of breath., Disp: , Rfl:  .  albuterol (PROVENTIL) (2.5 MG/3ML) 0.083% nebulizer solution, Take 3 mLs (2.5 mg total) by nebulization every 6 (six) hours as needed for wheezing or shortness of breath., Disp: 360 mL, Rfl: 5 .  ALPRAZolam (XANAX) 0.25 MG tablet, Take 0.25 mg by mouth 2 (two) times daily. , Disp: , Rfl:  .  azithromycin (ZITHROMAX) 250 MG tablet, Take 500 mg daily for 2 days more (Patient not taking: Reported on 08/11/2020), Disp: 4 tablet, Rfl: 0 .  escitalopram (LEXAPRO) 20 MG tablet, Take 20 mg by mouth daily., Disp: , Rfl:  .  fluticasone (FLONASE) 50 MCG/ACT nasal spray, USE 2 SPRAYS IN EACH NOSTRIL ONCE DAILY, Disp: 16 g, Rfl: 5 .  Fluticasone-Umeclidin-Vilant (TRELEGY ELLIPTA) 100-62.5-25 MCG/INH AEPB, Inhale 1 puff into the lungs daily., Disp: 1 each, Rfl: 5 .  Ipratropium-Albuterol (COMBIVENT RESPIMAT) 20-100 MCG/ACT AERS respimat, INHALE 1 PUFF BY MOUTH EVERY 6 HOURS AS NEEDED FOR WHEEZING, Disp: 4 g, Rfl: 5 .  mirtazapine (REMERON) 30 MG tablet, Take 30 mg by mouth at bedtime., Disp: , Rfl:  .  montelukast (SINGULAIR) 10 MG tablet, Take 10 mg by mouth at bedtime., Disp: , Rfl:   Past Medical History: Past Medical History:  Diagnosis Date  . Anxiety   . COPD (chronic obstructive pulmonary  disease) ([place])   . GERD (gastroesophageal reflux disease)   . Hypotension    limiting med titration  . NSTEMI (non-ST elevated myocardial infarction) [facility]) 2013   12/2011 with normal coronaries possibly secondary to Takotsubo (EF 30-35% by echo), occurred following two episodes of acute respiratory distress  . Pneumonia 11/2016   history of  . Takotsubo syndrome 4/13    Tobacco Use: Social History   Tobacco Use  Smoking Status Former Smoker  . Packs/day: 1.00  . Years: 30.00  . Pack years: 30.00  . Types: Cigarettes  . Quit date: 01/21/2012  . Years since quitting: 8.5  Smokeless Tobacco Never Used    Labs: Recent Review Flowsheet Data    Labs for ITP Cardiac and Pulmonary Rehab Latest Ref Rng & Units 08/08/2012 08/08/2012 04/04/2019 07/09/2020 07/09/2020   Cholestrol 0 - 200 mg/dL - - - - -   LDLCALC 0 - 99 mg/dL - - - - -   HDL >39 mg/dL - - - - -   Trlycerides <150 mg/dL - - - - -   Hemoglobin A1c <5.7 % - - - - -   PHART - 7.206(L) 7.332(L) 7.46 - -   PCO2ART mmHg 46.5(H) 47.3(H) 41 - -   HCO3 20.0 - 28.0 mmol/L 18.4(L) 25.1(H) 29.2 31.9(H) 31.6(H)   TCO2 0 - 100 mmol/L 20 27 - - -   ACIDBASEDEF 0.0 - 2.0 mmol/L 9.0(H) 1.0 - - -   O2SAT % 100.0 99.0 97.5 94.0  92.4       Pulmonary Assessment Scores:  Pulmonary Assessment Scores    Row Name 08/16/20 1618         ADL UCSD   ADL Phase Entry     SOB Score total 65     Rest 1     Walk 2     Stairs 5     Bath 1     Dress 1     Shop 2       CAT Score   CAT Score 29       mMRC Score   mMRC Score 3            UCSD: Self-administered rating of dyspnea associated with activities of daily living (ADLs) 6-point scale (0 = "not at all" to 5 = "maximal or unable to do because of breathlessness")  Scoring Scores range from 0 to 120.  Minimally important difference is 5 units  CAT: CAT can identify the health impairment of COPD patients and is better correlated with disease progression.  CAT has a scoring range  of zero to 40. The CAT score is classified into four groups of low (less than 10), medium (10 - 20), high (21-30) and very high (31-40) based on the impact level of disease on health status. A CAT score over 10 suggests significant symptoms.  A worsening CAT score could be explained by an exacerbation, poor medication adherence, poor inhaler technique, or progression of COPD or comorbid conditions.  CAT MCID is 2 points  mMRC: mMRC (Modified Medical Research Council) Dyspnea Scale is used to assess the degree of baseline functional disability in patients of respiratory disease due to dyspnea. No minimal important difference is established. A decrease in score of 1 point or greater is considered a positive change.   Pulmonary Function Assessment:   Exercise Target Goals: Exercise Program Goal: Individual exercise prescription set using results from initial 6 min walk test and THRR while considering  patient's activity barriers and safety.   Exercise Prescription Goal: Initial exercise prescription builds to 30-45 minutes a day of aerobic activity, 2-3 days per week.  Home exercise guidelines will be given to patient during program as part of exercise prescription that the participant will acknowledge.  Education: Aerobic Exercise & Resistance Training: - Gives group verbal and written instruction on the various components of exercise. Focuses on aerobic and resistive training programs and the benefits of this training and how to safely progress through these programs..   Education: Exercise & Equipment Safety: - Individual verbal instruction and demonstration of equipment use and safety with use of the equipment.   Pulmonary Rehab from 08/16/2020 in [facility] Cardiac and Pulmonary Rehab  Date 08/16/20  Educator AS  Instruction Review Code 1- Verbalizes Understanding      Education: Exercise Physiology & General Exercise Guidelines: - Group verbal and written instruction with models to review  the exercise physiology of the cardiovascular system and associated critical values. Provides general exercise guidelines with specific guidelines to those with heart or lung disease.    Pulmonary Rehab from 05/29/2016 in [facility] Cardiac and Pulmonary Rehab  Date 05/17/16  Educator Alberteen Sam and Griselda Miner  Instruction Review Code (retired) 2- meets goals/outcomes      Education: Flexibility, Insurance underwriter, Mind/Body Relaxation: Provides group verbal/written instruction on the benefits of flexibility and balance training, including mind/body exercise modes such as yoga, pilates and tai chi.  Demonstration and skill practice provided.   Pulmonary Rehab from 05/29/2016 in  [place] Cardiac and Pulmonary Rehab  Date 03/08/16  Educator Salley Hews, PT  Instruction Review Code (retired) 2- meets goals/outcomes      Activity Barriers & Risk Stratification:  Activity Barriers & Cardiac Risk Stratification - 08/11/20 1111      Activity Barriers & Cardiac Risk Stratification   Activity Barriers Shortness of Breath           6 Minute Walk:  6 Minute Walk    Row Name 08/16/20 1606         6 Minute Walk   Phase Initial     Distance 645 feet     Walk Time 5.33 minutes     # of Rest Breaks 3     MPH 1.4     METS 2.82     RPE 13     Perceived Dyspnea  3     VO2 Peak 9.9     Symptoms No     Resting HR 101 bpm     Resting BP 114/64     Resting Oxygen Saturation  96 %     Exercise Oxygen Saturation  during 6 min walk 93 %     Max Ex. HR 115 bpm     Max Ex. BP 188/78     2 Minute Post BP 118/68       Interval HR   1 Minute HR 100     2 Minute HR 110     3 Minute HR 110     4 Minute HR 115     5 Minute HR 109     6 Minute HR 104     2 Minute Post HR 107     Interval Heart Rate? Yes       Interval Oxygen   Interval Oxygen? Yes     Baseline Oxygen Saturation % 96 %     1 Minute Oxygen Saturation % 95 %     1 Minute Liters of Oxygen 3 L     2 Minute Oxygen Saturation % 94 %       2 Minute Liters of Oxygen 3 L     3 Minute Oxygen Saturation % 94 %     3 Minute Liters of Oxygen 3 L     4 Minute Oxygen Saturation % 94 %     4 Minute Liters of Oxygen 3 L     5 Minute Oxygen Saturation % 93 %     5 Minute Liters of Oxygen 3 L     6 Minute Oxygen Saturation % 93 %     6 Minute Liters of Oxygen 3 L     2 Minute Post Oxygen Saturation % 96 %     2 Minute Post Liters of Oxygen 3 L           Oxygen Initial Assessment:  Oxygen Initial Assessment - 08/11/20 1112      Home Oxygen   Home Oxygen Device Home Concentrator;Portable Concentrator    Sleep Oxygen Prescription Continuous    Liters per minute 3    Home Exercise Oxygen Prescription Continuous    Liters per minute 3    Home Resting Oxygen Prescription Continuous    Liters per minute 3    Compliance with Home Oxygen Use Yes      Intervention   Short Term Goals To learn and exhibit compliance with exercise, home and travel O2 prescription;To learn and understand importance of monitoring SPO2 with pulse oximeter and  demonstrate accurate use of the pulse oximeter.;To learn and understand importance of maintaining oxygen saturations>88%;To learn and demonstrate proper pursed lip breathing techniques or other breathing techniques.;To learn and demonstrate proper use of respiratory medications    Long  Term Goals Exhibits compliance with exercise, home and travel O2 prescription;Verbalizes importance of monitoring SPO2 with pulse oximeter and return demonstration;Maintenance of O2 saturations>88%;Compliance with respiratory medication;Demonstrates proper use of MDI's;Exhibits proper breathing techniques, such as pursed lip breathing or other method taught during program session           Oxygen Re-Evaluation:   Oxygen Discharge (Final Oxygen Re-Evaluation):   Initial Exercise Prescription:  Initial Exercise Prescription - 08/16/20 1600      Date of Initial Exercise RX and Referring Provider   Date 08/16/20     Referring Provider Aleskerov      Treadmill   MPH 1    Grade 0    Minutes 15    METs 1.77      NuStep   Level 1    SPM 80    Minutes 15    METs 2      REL-XR   Level 1    Speed 50    Minutes 15    METs 2      Prescription Details   Frequency (times per week) 3    Duration Progress to 30 minutes of continuous aerobic without signs/symptoms of physical distress      Intensity   THRR 40-80% of Max Heartrate 123-145    Ratings of Perceived Exertion 11-15    Perceived Dyspnea 0-4      Resistance Training   Training Prescription Yes    Weight 3 lb    Reps 10-15           Perform Capillary Blood Glucose checks as needed.  Exercise Prescription Changes:  Exercise Prescription Changes    Row Name 08/16/20 1600             Response to Exercise   Blood Pressure (Admit) 114/64       Blood Pressure (Exercise) 188/78       Blood Pressure (Exit) 118/68       Heart Rate (Admit) 101 bpm       Heart Rate (Exercise) 115 bpm       Heart Rate (Exit) 107 bpm       Oxygen Saturation (Admit) 96 %       Oxygen Saturation (Exercise) 93 %       Oxygen Saturation (Exit) 96 %       Rating of Perceived Exertion (Exercise) 13       Perceived Dyspnea (Exercise) 3       Symptoms none              Exercise Comments:   Exercise Goals and Review:  Exercise Goals    Row Name 08/16/20 1616             Exercise Goals   Increase Physical Activity Yes       Intervention Provide advice, education, support and counseling about physical activity/exercise needs.;Develop an individualized exercise prescription for aerobic and resistive training based on initial evaluation findings, risk stratification, comorbidities and participant's personal goals.       Expected Outcomes Short Term: Attend rehab on a regular basis to increase amount of physical activity.;Long Term: Add in home exercise to make exercise part of routine and to increase amount of physical activity.;Long Term:  Exercising regularly at  least 3-5 days a week.       Increase Strength and Stamina Yes       Intervention Provide advice, education, support and counseling about physical activity/exercise needs.;Develop an individualized exercise prescription for aerobic and resistive training based on initial evaluation findings, risk stratification, comorbidities and participant's personal goals.       Expected Outcomes Short Term: Increase workloads from initial exercise prescription for resistance, speed, and METs.;Short Term: Perform resistance training exercises routinely during rehab and add in resistance training at home;Long Term: Improve cardiorespiratory fitness, muscular endurance and strength as measured by increased METs and functional capacity (6MWT)       Able to understand and use rate of perceived exertion (RPE) scale Yes       Intervention Provide education and explanation on how to use RPE scale       Expected Outcomes Short Term: Able to use RPE daily in rehab to express subjective intensity level;Long Term:  Able to use RPE to guide intensity level when exercising independently       Able to understand and use Dyspnea scale Yes       Intervention Provide education and explanation on how to use Dyspnea scale       Expected Outcomes Short Term: Able to use Dyspnea scale daily in rehab to express subjective sense of shortness of breath during exertion;Long Term: Able to use Dyspnea scale to guide intensity level when exercising independently       Knowledge and understanding of Target Heart Rate Range (THRR) Yes       Intervention Provide education and explanation of THRR including how the numbers were predicted and where they are located for reference       Expected Outcomes Short Term: Able to state/look up THRR;Short Term: Able to use daily as guideline for intensity in rehab;Long Term: Able to use THRR to govern intensity when exercising independently       Able to check pulse independently Yes        Intervention Provide education and demonstration on how to check pulse in carotid and radial arteries.;Review the importance of being able to check your own pulse for safety during independent exercise       Expected Outcomes Short Term: Able to explain why pulse checking is important during independent exercise;Long Term: Able to check pulse independently and accurately       Understanding of Exercise Prescription Yes       Intervention Provide education, explanation, and written materials on patient's individual exercise prescription       Expected Outcomes Short Term: Able to explain program exercise prescription;Long Term: Able to explain home exercise prescription to exercise independently              Exercise Goals Re-Evaluation :   Discharge Exercise Prescription (Final Exercise Prescription Changes):  Exercise Prescription Changes - 08/16/20 1600      Response to Exercise   Blood Pressure (Admit) 114/64    Blood Pressure (Exercise) 188/78    Blood Pressure (Exit) 118/68    Heart Rate (Admit) 101 bpm    Heart Rate (Exercise) 115 bpm    Heart Rate (Exit) 107 bpm    Oxygen Saturation (Admit) 96 %    Oxygen Saturation (Exercise) 93 %    Oxygen Saturation (Exit) 96 %    Rating of Perceived Exertion (Exercise) 13    Perceived Dyspnea (Exercise) 3    Symptoms none  Nutrition:  Target Goals: Understanding of nutrition guidelines, daily intake of sodium <1558m, cholesterol <2061m calories 30% from fat and 7% or less from saturated fats, daily to have 5 or more servings of fruits and vegetables.  Education: Controlling Sodium/Reading Food Labels -Group verbal and written material supporting the discussion of sodium use in heart healthy nutrition. Review and explanation with models, verbal and written materials for utilization of the food label.   [facility]  Date 05/29/16  Educator CR  Instruction Review  Code (retired) 2- meets goals/outcomes      Education: General Nutrition Guidelines/Fats and Fiber: -Group instruction provided by verbal, written material, models and posters to present the general guidelines for heart healthy nutrition. Gives an explanation and review of dietary fats and fiber.   [facility]  Date 05/22/16  [aMarcelina Morelttended on 04/03/16]  Educator CR  Instruction Review Code (retired) 2- meets goals/outcomes      Biometrics:  Pre Biometrics - 08/16/20 1616      Pre Biometrics   Height _0  (1.651 m)    Weight 149 lb 4.8 oz (67.7 kg)    BMI (Calculated) 24.84    Single Leg Stand 7.98 seconds            Nutrition Therapy Plan and Nutrition Goals:   Nutrition Assessments:  Nutrition Assessments - 08/16/20 1619      MEDFICTS Scores   Pre Score 35           MEDIFICTS Score Key:          ?70 Need to make dietary changes          40-70 Heart Healthy Diet         ? 40 Therapeutic Level Cholesterol Diet  Nutrition Goals Re-Evaluation:   Nutrition Goals Discharge (Final Nutrition Goals Re-Evaluation):   Psychosocial: Target Goals: Acknowledge presence or absence of significant depression and/or stress, maximize coping skills, provide positive support system. Participant is able to verbalize types and ability to use techniques and skills needed for reducing stress and depression.   Education: Depression - Provides group verbal and written instruction on the correlation between heart/lung disease and depressed mood, treatment options, and the stigmas associated with seeking treatment.   [facility]  Date 04/12/16  Educator KaLucianne LeiMHConey [facility] HospitalInstruction Review Code (retired) 2- meets goals/outcomes      Education: Sleep Hygiene -Provides group verbal and written instruction about how sleep can affect your health.  Define sleep hygiene,  discuss sleep cycles and impact of sleep habits. Review good sleep hygiene tips.    Education: Stress and Anxiety: - Provides group verbal and written instruction about the health risks of elevated stress and causes of high stress.  Discuss the correlation between heart/lung disease and anxiety and treatment options. Review healthy ways to manage with stress and anxiety.   [facility]  Date 03/15/16  [facility]ator [facility] Review Code (retired) 2- meets goals/outcomes      Initial Review & Psychosocial Screening:  Initial Psych Review & Screening - 08/11/20 1114      Initial Review   Current issues with Current Depression;Current Psychotropic Meds;Current Stress Concerns    Source of Stress Concerns Unable to participate in former interests or hobbies;Unable to perform yard/household activities;Chronic Illness    Comments Chronic illness  causing not able to do things she would like to do.      Family Dynamics   Good Support System? Yes   Partner 7 years together in home, daughter not far about 10 min down the road.     Barriers   Psychosocial barriers to participate in program There are no identifiable barriers or psychosocial needs.;The patient should benefit from training in stress management and relaxation.      Screening Interventions   Interventions Encouraged to exercise;Provide feedback about the scores to participant;To provide support and resources with identified psychosocial needs    Expected Outcomes Short Term goal: Utilizing psychosocial counselor, staff and physician to assist with identification of specific Stressors or current issues interfering with healing process. Setting desired goal for each stressor or current issue identified.;Long Term Goal: Stressors or current issues are controlled or eliminated.;Short Term goal: Identification and review with participant of any Quality of Life or Depression concerns found  by scoring the questionnaire.;Long Term goal: The participant improves quality of Life and PHQ9 Scores as seen by post scores and/or verbalization of changes           Quality of Life Scores:  Scores of 19 and below usually indicate a poorer quality of life in these areas.  A difference of  2-3 points is a clinically meaningful difference.  A difference of 2-3 points in the total score of the Quality of Life Index has been associated with significant improvement in overall quality of life, self-image, physical symptoms, and general health in studies assessing change in quality of life.  PHQ-9: Recent Review Flowsheet Data    Depression screen [facility] 2/9 08/16/2020 05/26/2016 02/15/2016   Decreased Interest 1 0 0   Down, Depressed, Hopeless 1 1 0   PHQ - 2 Score 2 1 0   Altered sleeping _0 Tired, decreased energy _1 Change in appetite 0 0 1   Feeling bad or failure about yourself  0 0 0   Trouble concentrating 0 0 0   Moving slowly or fidgety/restless 0 0 0   Suicidal thoughts 0 0 0   PHQ-9 Score _2 Difficult doing work/chores Somewhat difficult Somewhat difficult Not difficult at all     Interpretation of Total Score  Total Score Depression Severity:  1-4 = Minimal depression, 5-9 = Mild depression, 10-14 = Moderate depression, 15-19 = Moderately severe depression, 20-27 = Severe depression   Psychosocial Evaluation and Intervention:  Psychosocial Evaluation - 08/11/20 1127      Psychosocial Evaluation & Interventions   Comments Nelida has no barriers to entering the program. She has attended PR in the past and remembers how it helped her have more energy to manage her daily ADLs and chores.  She does hae stress over the chronic illness and her inablility to manage her daily activities and activities she would like to participate in daily. She is looking forward to exercising and feeling better.  She lives at home with her partner of 7 years. Her daughter lives very  close by and is part of her support system. Her goal is to be able to move around more than she is right now. She is ready to start and work towards that goal.    Expected Outcomes STG: Oveda is able to attend all scheduled sessions.  LTG: Cartha benefits from the prgram in being able to manage more daily activiyies and decreasing her stress of not having  energy to do what she likes.    Continue Psychosocial Services  Follow up required by staff           Psychosocial Re-Evaluation:   Psychosocial Discharge (Final Psychosocial Re-Evaluation):   Education: Education Goals: Education classes will be provided on a weekly basis, covering required topics. Participant will state understanding/return demonstration of topics presented.  Learning Barriers/Preferences:   General Pulmonary Education Topics:  Infection Prevention: - Provides verbal and written material to individual with discussion of infection control including proper hand washing and proper equipment cleaning during exercise session.   Pulmonary Rehab from 08/16/2020 in [facility] Cardiac and Pulmonary Rehab  Date 08/16/20  Educator AS  Instruction Review Code 1- Verbalizes Understanding      Falls Prevention: - Provides verbal and written material to individual with discussion of falls prevention and safety.   Pulmonary Rehab from 08/16/2020 in [facility] Cardiac and Pulmonary Rehab  Date 08/16/20  Educator As  Instruction Review Code 1- Verbalizes Understanding      Chronic Lung Diseases: - Group verbal and written instruction to review updates, respiratory medications, advancements in procedures and treatments. Discuss use of supplemental oxygen including available portable oxygen systems, continuous and intermittent flow rates, concentrators, personal use and safety guidelines. Review proper use of inhaler and spacers. Provide informative websites for self-education.    Pulmonary Rehab from 05/29/2016 in [facility] Cardiac and  Pulmonary Rehab  Date 03/06/16  Educator L. Brown,RT  Instruction Review Code (retired) 2- Theatre stage manager: - Provide group verbal and written instruction for methods to conserve energy, plan and organize activities. Instruct on pacing techniques, use of adaptive equipment and posture/positioning to relieve shortness of breath.   Triggers and Exacerbations: - Group verbal and written instruction to review types of environmental triggers and ways to prevent exacerbations. Discuss weather changes, air quality and the benefits of nasal washing. Review warning signs and symptoms to help prevent infections. Discuss techniques for effective airway clearance, coughing, and vibrations.   Pulmonary Rehab from 02/25/2016 in [facility]  Date 02/21/16  Educator LB  Instruction Review Code (retired) 2- meets goals/outcomes      AED/CPR: - Group verbal and written instruction with the use of models to demonstrate the basic use of the AED with the basic ABC's of resuscitation.   Anatomy and Physiology of the Lungs: - Group verbal and written instruction with the use of models to provide basic lung anatomy and physiology related to function, structure and complications of lung disease.   Anatomy & Physiology of the Heart: - Group verbal and written instruction and models provide basic cardiac anatomy and physiology, with the coronary electrical and arterial systems. Review of Valvular disease and Heart Failure   Cardiac Medications: - Group verbal and written instruction to review commonly prescribed medications for heart disease. Reviews the medication, class of the drug, and side effects.   Other: -Provides group and verbal instruction on various topics (see comments)   Knowledge Questionnaire Score:  Knowledge Questionnaire Score - 08/16/20 1619      Knowledge Questionnaire Score   Pre Score 16/18 oxygen            Core  Components/Risk Factors/Patient Goals at Admission:  Personal Goals and Risk Factors at Admission - 08/16/20 1621      Core Components/Risk Factors/Patient Goals on Admission    Weight Management Yes    Intervention Weight Management: Develop a combined nutrition and exercise program designed  to reach desired caloric intake, while maintaining appropriate intake of nutrient and fiber, sodium and fats, and appropriate energy expenditure required for the weight goal.;Weight Management/Obesity: Establish reasonable short term and long term weight goals.;Weight Management: Provide education and appropriate resources to help participant work on and attain dietary goals.;Obesity: Provide education and appropriate resources to help participant work on and attain dietary goals.    Admit Weight 149 lb 4.8 oz (67.7 kg)    Goal Weight: Short Term 145 lb (65.8 kg)    Goal Weight: Long Term 130 lb (59 kg)    Expected Outcomes Short Term: Continue to assess and modify interventions until short term weight is achieved;Long Term: Adherence to nutrition and physical activity/exercise program aimed toward attainment of established weight goal;Weight Loss: Understanding of general recommendations for a balanced deficit meal plan, which promotes 1-2 lb weight loss per week and includes a negative energy balance of [phone] kcal/d    Improve shortness of breath with ADL's Yes           Education:Diabetes - Individual verbal and written instruction to review signs/symptoms of diabetes, desired ranges of glucose level fasting, after meals and with exercise. Acknowledge that pre and post exercise glucose checks will be done for 3 sessions at entry of program.   Education: Know Your Numbers and Risk Factors: -Group verbal and written instruction about important numbers in your health.  Discussion of what are risk factors and how they play a role in the disease process.  Review of Cholesterol, Blood Pressure, Diabetes,  and BMI and the role they play in your overall health.   Core Components/Risk Factors/Patient Goals Review:    Core Components/Risk Factors/Patient Goals at Discharge (Final Review):    ITP Comments:  ITP Comments    Row Name 08/11/20 1134           ITP Comments Virtual orientation call completed today. shehas an appointment on Date: 08/16/2020 for EP eval and gym Orientation.  Documentation of diagnosis can be found in [facility]  Date: [id number].              Comments: initial ITP

## 2020-08-30 ENCOUNTER — Telehealth: Payer: Self-pay

## 2020-08-30 NOTE — Telephone Encounter (Signed)
Pt has a slight fever and is not coming to exercise today.  Staff recommended she contact her Dr .

## 2020-09-01 ENCOUNTER — Encounter: Payer: Self-pay | Admitting: *Deleted

## 2020-09-01 DIAGNOSIS — J449 Chronic obstructive pulmonary disease, unspecified: Secondary | ICD-10-CM

## 2020-09-01 NOTE — Progress Notes (Signed)
Pulmonary Individual Treatment Plan  Patient Details  Name: Heidi [name] MRN: [id number] Date of Birth: [birth year] Referring Provider:     Pulmonary Rehab from 08/16/2020 in [facility] Cardiac and Pulmonary Rehab  Referring Provider Aleskerov      Initial Encounter Date:    Pulmonary Rehab from 08/16/2020 in [facility] Cardiac and Pulmonary Rehab  Date 08/16/20      Visit Diagnosis: Chronic obstructive pulmonary disease, unspecified COPD type ([place])  Patient's Home Medications on Admission:  Current Outpatient Medications:  .  albuterol (PROVENTIL HFA;VENTOLIN HFA) 108 (90 Base) MCG/ACT inhaler, Inhale 1-2 puffs into the lungs every 4 (four) hours as needed for wheezing or shortness of breath., Disp: , Rfl:  .  albuterol (PROVENTIL) (2.5 MG/3ML) 0.083% nebulizer solution, Take 3 mLs (2.5 mg total) by nebulization every 6 (six) hours as needed for wheezing or shortness of breath., Disp: 360 mL, Rfl: 5 .  ALPRAZolam (XANAX) 0.25 MG tablet, Take 0.25 mg by mouth 2 (two) times daily. , Disp: , Rfl:  .  azithromycin (ZITHROMAX) 250 MG tablet, Take 500 mg daily for 2 days more (Patient not taking: Reported on 08/11/2020), Disp: 4 tablet, Rfl: 0 .  escitalopram (LEXAPRO) 20 MG tablet, Take 20 mg by mouth daily., Disp: , Rfl:  .  fluticasone (FLONASE) 50 MCG/ACT nasal spray, USE 2 SPRAYS IN EACH NOSTRIL ONCE DAILY, Disp: 16 g, Rfl: 5 .  Fluticasone-Umeclidin-Vilant (TRELEGY ELLIPTA) 100-62.5-25 MCG/INH AEPB, Inhale 1 puff into the lungs daily., Disp: 1 each, Rfl: 5 .  Ipratropium-Albuterol (COMBIVENT RESPIMAT) 20-100 MCG/ACT AERS respimat, INHALE 1 PUFF BY MOUTH EVERY 6 HOURS AS NEEDED FOR WHEEZING, Disp: 4 g, Rfl: 5 .  mirtazapine (REMERON) 30 MG tablet, Take 30 mg by mouth at bedtime., Disp: , Rfl:  .  montelukast (SINGULAIR) 10 MG tablet, Take 10 mg by mouth at bedtime., Disp: , Rfl:   Past Medical History: Past Medical History:  Diagnosis Date  . Anxiety   . COPD (chronic obstructive pulmonary  disease) ([place])   . GERD (gastroesophageal reflux disease)   . Hypotension    limiting med titration  . NSTEMI (non-ST elevated myocardial infarction) [facility]) 2013   12/2011 with normal coronaries possibly secondary to Takotsubo (EF 30-35% by echo), occurred following two episodes of acute respiratory distress  . Pneumonia 11/2016   history of  . Takotsubo syndrome 4/13    Tobacco Use: Social History   Tobacco Use  Smoking Status Former Smoker  . Packs/day: 1.00  . Years: 30.00  . Pack years: 30.00  . Types: Cigarettes  . Quit date: 01/21/2012  . Years since quitting: 8.6  Smokeless Tobacco Never Used    Labs: Recent Review Scientist, physiological    Labs for ITP Cardiac and Pulmonary Rehab Latest Ref Rng & Units 08/08/2012 08/08/2012 04/04/2019 07/09/2020 07/09/2020   Cholestrol 0 - 200 mg/dL - - - - -   LDLCALC 0 - 99 mg/dL - - - - -   HDL >39 mg/dL - - - - -   Trlycerides <150 mg/dL - - - - -   Hemoglobin A1c <5.7 % - - - - -   PHART - 7.206(L) 7.332(L) 7.46 - -   PCO2ART mmHg 46.5(H) 47.3(H) 41 - -   HCO3 20.0 - 28.0 mmol/L 18.4(L) 25.1(H) 29.2 31.9(H) 31.6(H)   TCO2 0 - 100 mmol/L 20 27 - - -   ACIDBASEDEF 0.0 - 2.0 mmol/L 9.0(H) 1.0 - - -   O2SAT % 100.0 99.0 97.5 94.0  92.4       Pulmonary Assessment Scores:  Pulmonary Assessment Scores    Row Name 08/16/20 1618         ADL UCSD   ADL Phase Entry     SOB Score total 65     Rest 1     Walk 2     Stairs 5     Bath 1     Dress 1     Shop 2       CAT Score   CAT Score 29       mMRC Score   mMRC Score 3            UCSD: Self-administered rating of dyspnea associated with activities of daily living (ADLs) 6-point scale (0 = "not at all" to 5 = "maximal or unable to do because of breathlessness")  Scoring Scores range from 0 to 120.  Minimally important difference is 5 units  CAT: CAT can identify the health impairment of COPD patients and is better correlated with disease progression.  CAT has a scoring range  of zero to 40. The CAT score is classified into four groups of low (less than 10), medium (10 - 20), high (21-30) and very high (31-40) based on the impact level of disease on health status. A CAT score over 10 suggests significant symptoms.  A worsening CAT score could be explained by an exacerbation, poor medication adherence, poor inhaler technique, or progression of COPD or comorbid conditions.  CAT MCID is 2 points  mMRC: mMRC (Modified Medical Research Council) Dyspnea Scale is used to assess the degree of baseline functional disability in patients of respiratory disease due to dyspnea. No minimal important difference is established. A decrease in score of 1 point or greater is considered a positive change.   Pulmonary Function Assessment:   Exercise Target Goals: Exercise Program Goal: Individual exercise prescription set using results from initial 6 min walk test and THRR while considering  patient's activity barriers and safety.   Exercise Prescription Goal: Initial exercise prescription builds to 30-45 minutes a day of aerobic activity, 2-3 days per week.  Home exercise guidelines will be given to patient during program as part of exercise prescription that the participant will acknowledge.  Education: Aerobic Exercise & Resistance Training: - Gives group verbal and written instruction on the various components of exercise. Focuses on aerobic and resistive training programs and the benefits of this training and how to safely progress through these programs..   Education: Exercise & Equipment Safety: - Individual verbal instruction and demonstration of equipment use and safety with use of the equipment.   Pulmonary Rehab from 08/16/2020 in [facility] Cardiac and Pulmonary Rehab  Date 08/16/20  Educator AS  Instruction Review Code 1- Verbalizes Understanding      Education: Exercise Physiology & General Exercise Guidelines: - Group verbal and written instruction with models to review  the exercise physiology of the cardiovascular system and associated critical values. Provides general exercise guidelines with specific guidelines to those with heart or lung disease.    Pulmonary Rehab from 05/29/2016 in [facility] Cardiac and Pulmonary Rehab  Date 05/17/16  Educator Alberteen Sam and Griselda Miner  Instruction Review Code (retired) 2- meets goals/outcomes      Education: Flexibility, Insurance underwriter, Mind/Body Relaxation: Provides group verbal/written instruction on the benefits of flexibility and balance training, including mind/body exercise modes such as yoga, pilates and tai chi.  Demonstration and skill practice provided.   Pulmonary Rehab from 05/29/2016 in  [place] Cardiac and Pulmonary Rehab  Date 03/08/16  Educator Salley Hews, PT  Instruction Review Code (retired) 2- meets goals/outcomes      Activity Barriers & Risk Stratification:  Activity Barriers & Cardiac Risk Stratification - 08/11/20 1111      Activity Barriers & Cardiac Risk Stratification   Activity Barriers Shortness of Breath           6 Minute Walk:  6 Minute Walk    Row Name 08/16/20 1606         6 Minute Walk   Phase Initial     Distance 645 feet     Walk Time 5.33 minutes     # of Rest Breaks 3     MPH 1.4     METS 2.82     RPE 13     Perceived Dyspnea  3     VO2 Peak 9.9     Symptoms No     Resting HR 101 bpm     Resting BP 114/64     Resting Oxygen Saturation  96 %     Exercise Oxygen Saturation  during 6 min walk 93 %     Max Ex. HR 115 bpm     Max Ex. BP 188/78     2 Minute Post BP 118/68       Interval HR   1 Minute HR 100     2 Minute HR 110     3 Minute HR 110     4 Minute HR 115     5 Minute HR 109     6 Minute HR 104     2 Minute Post HR 107     Interval Heart Rate? Yes       Interval Oxygen   Interval Oxygen? Yes     Baseline Oxygen Saturation % 96 %     1 Minute Oxygen Saturation % 95 %     1 Minute Liters of Oxygen 3 L     2 Minute Oxygen Saturation % 94 %       2 Minute Liters of Oxygen 3 L     3 Minute Oxygen Saturation % 94 %     3 Minute Liters of Oxygen 3 L     4 Minute Oxygen Saturation % 94 %     4 Minute Liters of Oxygen 3 L     5 Minute Oxygen Saturation % 93 %     5 Minute Liters of Oxygen 3 L     6 Minute Oxygen Saturation % 93 %     6 Minute Liters of Oxygen 3 L     2 Minute Post Oxygen Saturation % 96 %     2 Minute Post Liters of Oxygen 3 L           Oxygen Initial Assessment:  Oxygen Initial Assessment - 08/11/20 1112      Home Oxygen   Home Oxygen Device Home Concentrator;Portable Concentrator    Sleep Oxygen Prescription Continuous    Liters per minute 3    Home Exercise Oxygen Prescription Continuous    Liters per minute 3    Home Resting Oxygen Prescription Continuous    Liters per minute 3    Compliance with Home Oxygen Use Yes      Intervention   Short Term Goals To learn and exhibit compliance with exercise, home and travel O2 prescription;To learn and understand importance of monitoring SPO2 with pulse oximeter and  demonstrate accurate use of the pulse oximeter.;To learn and understand importance of maintaining oxygen saturations>88%;To learn and demonstrate proper pursed lip breathing techniques or other breathing techniques.;To learn and demonstrate proper use of respiratory medications    Long  Term Goals Exhibits compliance with exercise, home and travel O2 prescription;Verbalizes importance of monitoring SPO2 with pulse oximeter and return demonstration;Maintenance of O2 saturations>88%;Compliance with respiratory medication;Demonstrates proper use of MDI's;Exhibits proper breathing techniques, such as pursed lip breathing or other method taught during program session           Oxygen Re-Evaluation:   Oxygen Discharge (Final Oxygen Re-Evaluation):   Initial Exercise Prescription:  Initial Exercise Prescription - 08/16/20 1600      Date of Initial Exercise RX and Referring Provider   Date 08/16/20     Referring Provider Aleskerov      Treadmill   MPH 1    Grade 0    Minutes 15    METs 1.77      NuStep   Level 1    SPM 80    Minutes 15    METs 2      REL-XR   Level 1    Speed 50    Minutes 15    METs 2      Prescription Details   Frequency (times per week) 3    Duration Progress to 30 minutes of continuous aerobic without signs/symptoms of physical distress      Intensity   THRR 40-80% of Max Heartrate 123-145    Ratings of Perceived Exertion 11-15    Perceived Dyspnea 0-4      Resistance Training   Training Prescription Yes    Weight 3 lb    Reps 10-15           Perform Capillary Blood Glucose checks as needed.  Exercise Prescription Changes:  Exercise Prescription Changes    Row Name 08/16/20 1600             Response to Exercise   Blood Pressure (Admit) 114/64       Blood Pressure (Exercise) 188/78       Blood Pressure (Exit) 118/68       Heart Rate (Admit) 101 bpm       Heart Rate (Exercise) 115 bpm       Heart Rate (Exit) 107 bpm       Oxygen Saturation (Admit) 96 %       Oxygen Saturation (Exercise) 93 %       Oxygen Saturation (Exit) 96 %       Rating of Perceived Exertion (Exercise) 13       Perceived Dyspnea (Exercise) 3       Symptoms none              Exercise Comments:   Exercise Goals and Review:  Exercise Goals    Row Name 08/16/20 1616             Exercise Goals   Increase Physical Activity Yes       Intervention Provide advice, education, support and counseling about physical activity/exercise needs.;Develop an individualized exercise prescription for aerobic and resistive training based on initial evaluation findings, risk stratification, comorbidities and participant's personal goals.       Expected Outcomes Short Term: Attend rehab on a regular basis to increase amount of physical activity.;Long Term: Add in home exercise to make exercise part of routine and to increase amount of physical activity.;Long Term:  Exercising regularly at  least 3-5 days a week.       Increase Strength and Stamina Yes       Intervention Provide advice, education, support and counseling about physical activity/exercise needs.;Develop an individualized exercise prescription for aerobic and resistive training based on initial evaluation findings, risk stratification, comorbidities and participant's personal goals.       Expected Outcomes Short Term: Increase workloads from initial exercise prescription for resistance, speed, and METs.;Short Term: Perform resistance training exercises routinely during rehab and add in resistance training at home;Long Term: Improve cardiorespiratory fitness, muscular endurance and strength as measured by increased METs and functional capacity (6MWT)       Able to understand and use rate of perceived exertion (RPE) scale Yes       Intervention Provide education and explanation on how to use RPE scale       Expected Outcomes Short Term: Able to use RPE daily in rehab to express subjective intensity level;Long Term:  Able to use RPE to guide intensity level when exercising independently       Able to understand and use Dyspnea scale Yes       Intervention Provide education and explanation on how to use Dyspnea scale       Expected Outcomes Short Term: Able to use Dyspnea scale daily in rehab to express subjective sense of shortness of breath during exertion;Long Term: Able to use Dyspnea scale to guide intensity level when exercising independently       Knowledge and understanding of Target Heart Rate Range (THRR) Yes       Intervention Provide education and explanation of THRR including how the numbers were predicted and where they are located for reference       Expected Outcomes Short Term: Able to state/look up THRR;Short Term: Able to use daily as guideline for intensity in rehab;Long Term: Able to use THRR to govern intensity when exercising independently       Able to check pulse independently Yes        Intervention Provide education and demonstration on how to check pulse in carotid and radial arteries.;Review the importance of being able to check your own pulse for safety during independent exercise       Expected Outcomes Short Term: Able to explain why pulse checking is important during independent exercise;Long Term: Able to check pulse independently and accurately       Understanding of Exercise Prescription Yes       Intervention Provide education, explanation, and written materials on patient's individual exercise prescription       Expected Outcomes Short Term: Able to explain program exercise prescription;Long Term: Able to explain home exercise prescription to exercise independently              Exercise Goals Re-Evaluation :   Discharge Exercise Prescription (Final Exercise Prescription Changes):  Exercise Prescription Changes - 08/16/20 1600      Response to Exercise   Blood Pressure (Admit) 114/64    Blood Pressure (Exercise) 188/78    Blood Pressure (Exit) 118/68    Heart Rate (Admit) 101 bpm    Heart Rate (Exercise) 115 bpm    Heart Rate (Exit) 107 bpm    Oxygen Saturation (Admit) 96 %    Oxygen Saturation (Exercise) 93 %    Oxygen Saturation (Exit) 96 %    Rating of Perceived Exertion (Exercise) 13    Perceived Dyspnea (Exercise) 3    Symptoms none  Nutrition:  Target Goals: Understanding of nutrition guidelines, daily intake of sodium <1558m, cholesterol <2061m calories 30% from fat and 7% or less from saturated fats, daily to have 5 or more servings of fruits and vegetables.  Education: Controlling Sodium/Reading Food Labels -Group verbal and written material supporting the discussion of sodium use in heart healthy nutrition. Review and explanation with models, verbal and written materials for utilization of the food label.   [facility]  Date 05/29/16  Educator CR  Instruction Review  Code (retired) 2- meets goals/outcomes      Education: General Nutrition Guidelines/Fats and Fiber: -Group instruction provided by verbal, written material, models and posters to present the general guidelines for heart healthy nutrition. Gives an explanation and review of dietary fats and fiber.   [facility]  Date 05/22/16  [aMarcelina Morelttended on 04/03/16]  Educator CR  Instruction Review Code (retired) 2- meets goals/outcomes      Biometrics:  Pre Biometrics - 08/16/20 1616      Pre Biometrics   Height _0  (1.651 m)    Weight 149 lb 4.8 oz (67.7 kg)    BMI (Calculated) 24.84    Single Leg Stand 7.98 seconds            Nutrition Therapy Plan and Nutrition Goals:   Nutrition Assessments:  Nutrition Assessments - 08/16/20 1619      MEDFICTS Scores   Pre Score 35           MEDIFICTS Score Key:          ?70 Need to make dietary changes          40-70 Heart Healthy Diet         ? 40 Therapeutic Level Cholesterol Diet  Nutrition Goals Re-Evaluation:   Nutrition Goals Discharge (Final Nutrition Goals Re-Evaluation):   Psychosocial: Target Goals: Acknowledge presence or absence of significant depression and/or stress, maximize coping skills, provide positive support system. Participant is able to verbalize types and ability to use techniques and skills needed for reducing stress and depression.   Education: Depression - Provides group verbal and written instruction on the correlation between heart/lung disease and depressed mood, treatment options, and the stigmas associated with seeking treatment.   [facility]  Date 04/12/16  Educator KaLucianne LeiMHConey [facility] HospitalInstruction Review Code (retired) 2- meets goals/outcomes      Education: Sleep Hygiene -Provides group verbal and written instruction about how sleep can affect your health.  Define sleep hygiene,  discuss sleep cycles and impact of sleep habits. Review good sleep hygiene tips.    Education: Stress and Anxiety: - Provides group verbal and written instruction about the health risks of elevated stress and causes of high stress.  Discuss the correlation between heart/lung disease and anxiety and treatment options. Review healthy ways to manage with stress and anxiety.   [facility]  Date 03/15/16  [facility]ator [facility] Review Code (retired) 2- meets goals/outcomes      Initial Review & Psychosocial Screening:  Initial Psych Review & Screening - 08/11/20 1114      Initial Review   Current issues with Current Depression;Current Psychotropic Meds;Current Stress Concerns    Source of Stress Concerns Unable to participate in former interests or hobbies;Unable to perform yard/household activities;Chronic Illness    Comments Chronic illness  causing not able to do things she would like to do.      Family Dynamics   Good Support System? Yes   Partner 7 years together in home, daughter not far about 10 min down the road.     Barriers   Psychosocial barriers to participate in program There are no identifiable barriers or psychosocial needs.;The patient should benefit from training in stress management and relaxation.      Screening Interventions   Interventions Encouraged to exercise;Provide feedback about the scores to participant;To provide support and resources with identified psychosocial needs    Expected Outcomes Short Term goal: Utilizing psychosocial counselor, staff and physician to assist with identification of specific Stressors or current issues interfering with healing process. Setting desired goal for each stressor or current issue identified.;Long Term Goal: Stressors or current issues are controlled or eliminated.;Short Term goal: Identification and review with participant of any Quality of Life or Depression concerns found  by scoring the questionnaire.;Long Term goal: The participant improves quality of Life and PHQ9 Scores as seen by post scores and/or verbalization of changes           Quality of Life Scores:  Scores of 19 and below usually indicate a poorer quality of life in these areas.  A difference of  2-3 points is a clinically meaningful difference.  A difference of 2-3 points in the total score of the Quality of Life Index has been associated with significant improvement in overall quality of life, self-image, physical symptoms, and general health in studies assessing change in quality of life.  PHQ-9: Recent Review Flowsheet Data    Depression screen [facility] 2/9 08/16/2020 05/26/2016 02/15/2016   Decreased Interest 1 0 0   Down, Depressed, Hopeless 1 1 0   PHQ - 2 Score 2 1 0   Altered sleeping _0 Tired, decreased energy _1 Change in appetite 0 0 1   Feeling bad or failure about yourself  0 0 0   Trouble concentrating 0 0 0   Moving slowly or fidgety/restless 0 0 0   Suicidal thoughts 0 0 0   PHQ-9 Score _2 Difficult doing work/chores Somewhat difficult Somewhat difficult Not difficult at all     Interpretation of Total Score  Total Score Depression Severity:  1-4 = Minimal depression, 5-9 = Mild depression, 10-14 = Moderate depression, 15-19 = Moderately severe depression, 20-27 = Severe depression   Psychosocial Evaluation and Intervention:  Psychosocial Evaluation - 08/11/20 1127      Psychosocial Evaluation & Interventions   Comments Nelida has no barriers to entering the program. She has attended PR in the past and remembers how it helped her have more energy to manage her daily ADLs and chores.  She does hae stress over the chronic illness and her inablility to manage her daily activities and activities she would like to participate in daily. She is looking forward to exercising and feeling better.  She lives at home with her partner of 7 years. Her daughter lives very  close by and is part of her support system. Her goal is to be able to move around more than she is right now. She is ready to start and work towards that goal.    Expected Outcomes STG: Oveda is able to attend all scheduled sessions.  LTG: Cartha benefits from the prgram in being able to manage more daily activiyies and decreasing her stress of not having  energy to do what she likes.    Continue Psychosocial Services  Follow up required by staff           Psychosocial Re-Evaluation:   Psychosocial Discharge (Final Psychosocial Re-Evaluation):   Education: Education Goals: Education classes will be provided on a weekly basis, covering required topics. Participant will state understanding/return demonstration of topics presented.  Learning Barriers/Preferences:   General Pulmonary Education Topics:  Infection Prevention: - Provides verbal and written material to individual with discussion of infection control including proper hand washing and proper equipment cleaning during exercise session.   Pulmonary Rehab from 08/16/2020 in [facility] Cardiac and Pulmonary Rehab  Date 08/16/20  Educator AS  Instruction Review Code 1- Verbalizes Understanding      Falls Prevention: - Provides verbal and written material to individual with discussion of falls prevention and safety.   Pulmonary Rehab from 08/16/2020 in [facility] Cardiac and Pulmonary Rehab  Date 08/16/20  Educator As  Instruction Review Code 1- Verbalizes Understanding      Chronic Lung Diseases: - Group verbal and written instruction to review updates, respiratory medications, advancements in procedures and treatments. Discuss use of supplemental oxygen including available portable oxygen systems, continuous and intermittent flow rates, concentrators, personal use and safety guidelines. Review proper use of inhaler and spacers. Provide informative websites for self-education.    Pulmonary Rehab from 05/29/2016 in [facility] Cardiac and  Pulmonary Rehab  Date 03/06/16  Educator L. Brown,RT  Instruction Review Code (retired) 2- Theatre stage manager: - Provide group verbal and written instruction for methods to conserve energy, plan and organize activities. Instruct on pacing techniques, use of adaptive equipment and posture/positioning to relieve shortness of breath.   Triggers and Exacerbations: - Group verbal and written instruction to review types of environmental triggers and ways to prevent exacerbations. Discuss weather changes, air quality and the benefits of nasal washing. Review warning signs and symptoms to help prevent infections. Discuss techniques for effective airway clearance, coughing, and vibrations.   Pulmonary Rehab from 02/25/2016 in [facility] Cardiac and Pulmonary Rehab  Date 02/21/16  Educator LB  Instruction Review Code (retired) 2- meets goals/outcomes      AED/CPR: - Group verbal and written instruction with the use of models to demonstrate the basic use of the AED with the basic ABC's of resuscitation.   Anatomy and Physiology of the Lungs: - Group verbal and written instruction with the use of models to provide basic lung anatomy and physiology related to function, structure and complications of lung disease.   Anatomy & Physiology of the Heart: - Group verbal and written instruction and models provide basic cardiac anatomy and physiology, with the coronary electrical and arterial systems. Review of Valvular disease and Heart Failure   Cardiac Medications: - Group verbal and written instruction to review commonly prescribed medications for heart disease. Reviews the medication, class of the drug, and side effects.   Other: -Provides group and verbal instruction on various topics (see comments)   Knowledge Questionnaire Score:  Knowledge Questionnaire Score - 08/16/20 1619      Knowledge Questionnaire Score   Pre Score 16/18 oxygen            Core  Components/Risk Factors/Patient Goals at Admission:  Personal Goals and Risk Factors at Admission - 08/16/20 1621      Core Components/Risk Factors/Patient Goals on Admission    Weight Management Yes    Intervention Weight Management: Develop a combined nutrition and exercise program designed  to reach desired caloric intake, while maintaining appropriate intake of nutrient and fiber, sodium and fats, and appropriate energy expenditure required for the weight goal.;Weight Management/Obesity: Establish reasonable short term and long term weight goals.;Weight Management: Provide education and appropriate resources to help participant work on and attain dietary goals.;Obesity: Provide education and appropriate resources to help participant work on and attain dietary goals.    Admit Weight 149 lb 4.8 oz (67.7 kg)    Goal Weight: Short Term 145 lb (65.8 kg)    Goal Weight: Long Term 130 lb (59 kg)    Expected Outcomes Short Term: Continue to assess and modify interventions until short term weight is achieved;Long Term: Adherence to nutrition and physical activity/exercise program aimed toward attainment of established weight goal;Weight Loss: Understanding of general recommendations for a balanced deficit meal plan, which promotes 1-2 lb weight loss per week and includes a negative energy balance of [phone] kcal/d    Improve shortness of breath with ADL's Yes           Education:Diabetes - Individual verbal and written instruction to review signs/symptoms of diabetes, desired ranges of glucose level fasting, after meals and with exercise. Acknowledge that pre and post exercise glucose checks will be done for 3 sessions at entry of program.   Education: Know Your Numbers and Risk Factors: -Group verbal and written instruction about important numbers in your health.  Discussion of what are risk factors and how they play a role in the disease process.  Review of Cholesterol, Blood Pressure, Diabetes,  and BMI and the role they play in your overall health.   Core Components/Risk Factors/Patient Goals Review:    Core Components/Risk Factors/Patient Goals at Discharge (Final Review):    ITP Comments:  ITP Comments    Row Name 08/11/20 1134 08/16/20 1624 09/01/20 0616       ITP Comments Virtual orientation call completed today. shehas an appointment on Date: 08/16/2020 for EP eval and gym Orientation.  Documentation of diagnosis can be found in [facility]  Date: [id number]. Completed 6MWT and gym orientation. Initial ITP created and sent for review to Dr. Emily Filbert, Medical Director. 30 Day review completed. Medical Director ITP review done, changes made as directed, and signed approval by Medical Director.            Comments:

## 2020-09-02 ENCOUNTER — Telehealth: Payer: Self-pay | Admitting: *Deleted

## 2020-09-02 ENCOUNTER — Other Ambulatory Visit: Payer: Self-pay | Admitting: Adult Health

## 2020-09-02 DIAGNOSIS — J432 Centrilobular emphysema: Secondary | ICD-10-CM

## 2020-09-02 NOTE — Telephone Encounter (Signed)
Patient called to let us know that she has been out sick. She saw her doctor and was told to increase her breathing treatments to twice a day for the rest of the week.  She is hoping to be here on Monday.

## 2020-09-06 ENCOUNTER — Other Ambulatory Visit: Payer: Self-pay

## 2020-09-06 ENCOUNTER — Encounter: Payer: Medicare Other | Attending: Pulmonary Disease

## 2020-09-06 DIAGNOSIS — J449 Chronic obstructive pulmonary disease, unspecified: Secondary | ICD-10-CM | POA: Diagnosis present

## 2020-09-06 NOTE — Progress Notes (Signed)
Daily Session Note  Patient Details  Name: Heidi [name] MRN: [id number] Date of Birth: 12-04-1955 Referring Provider:     Pulmonary Rehab from 08/16/2020 in [facility] Cardiac and Pulmonary Rehab  Referring Provider [name]      Encounter Date: 09/06/2020  Check In:  Session Check In - 09/06/20 1405      Check-In   Supervising physician immediately available to respond to emergencies See telemetry face sheet for immediately available ER MD    Location [facility]-Cardiac & Pulmonary Rehab    Staff Present Heidi [name], MPA, Heidi [name], BS, ACSM CEP, Exercise Physiologist;Heidi Eliezer Bottom, MS Exercise Physiologist    Virtual Visit No    Medication changes reported     No    Fall or balance concerns reported    No    Warm-up and Cool-down Performed on first and last piece of equipment    Resistance Training Performed Yes    VAD Patient? No    PAD/SET Patient? No      Pain Assessment   Currently in Pain? No/denies              Social History   Tobacco Use  Smoking Status Former Smoker  . Packs/day: 1.00  . Years: 30.00  . Pack years: 30.00  . Types: Cigarettes  . Quit date: 01/21/2012  . Years since quitting: 8.6  Smokeless Tobacco Never Used    Goals Met:  Independence with exercise equipment Using PLB without cueing & demonstrates good technique Exercise tolerated well No report of cardiac concerns or symptoms Strength training completed today  Goals Unmet:  Not Applicable  Comments: First full day of exercise!  Patient was oriented to gym and equipment including functions, settings, policies, and procedures.  Patient's individual exercise prescription and treatment plan were reviewed.  All starting workloads were established based on the results of the 6 minute walk test done at initial orientation visit.  The plan for exercise progression was also introduced and progression will be customized based on patient's performance and goals.    Dr. Emily Filbert is  Medical Director for [place] and [facility].

## 2020-09-08 ENCOUNTER — Other Ambulatory Visit: Payer: Self-pay

## 2020-09-08 DIAGNOSIS — J449 Chronic obstructive pulmonary disease, unspecified: Secondary | ICD-10-CM | POA: Diagnosis not present

## 2020-09-08 NOTE — Progress Notes (Signed)
Daily Session Note  Patient Details  Name: Heidi [name] MRN: [id number] Date of Birth: 1956-03-28 Referring Provider:     Pulmonary Rehab from 08/16/2020 in [facility] Cardiac and Pulmonary Rehab  Referring Provider [name]      Encounter Date: 09/08/2020  Check In:  Session Check In - 09/08/20 1349      Check-In   Supervising physician immediately available to respond to emergencies See telemetry face sheet for immediately available ER MD    Location [facility]-Cardiac & Pulmonary Rehab    Staff Present Birdie Sons, MPA, Elveria Rising, BA, ACSM CEP, Exercise Physiologist;Kara Eliezer Bottom, MS Exercise Physiologist    Virtual Visit No    Medication changes reported     No    Fall or balance concerns reported    No    Warm-up and Cool-down Performed on first and last piece of equipment    Resistance Training Performed Yes    VAD Patient? No    PAD/SET Patient? No      Pain Assessment   Currently in Pain? No/denies              Social History   Tobacco Use  Smoking Status Former Smoker  . Packs/day: 1.00  . Years: 30.00  . Pack years: 30.00  . Types: Cigarettes  . Quit date: 01/21/2012  . Years since quitting: 8.6  Smokeless Tobacco Never Used    Goals Met:  Independence with exercise equipment Exercise tolerated well No report of cardiac concerns or symptoms Strength training completed today  Goals Unmet:  Not Applicable  Comments: Pt able to follow exercise prescription today without complaint.  Will continue to monitor for progression.    Dr. Emily Filbert is Medical Director for [place] and [facility].

## 2020-09-09 ENCOUNTER — Other Ambulatory Visit: Payer: Self-pay

## 2020-09-09 ENCOUNTER — Encounter: Payer: Medicare Other | Admitting: *Deleted

## 2020-09-09 DIAGNOSIS — J449 Chronic obstructive pulmonary disease, unspecified: Secondary | ICD-10-CM | POA: Diagnosis not present

## 2020-09-09 NOTE — Progress Notes (Signed)
Daily Session Note  Patient Details  Name: MIKEA QUADROS MRN: [id number] Date of Birth: 12-27-1955 Referring Provider:     Pulmonary Rehab from 08/16/2020 in [facility] Cardiac and Pulmonary Rehab  Referring Provider [name]      Encounter Date: 09/09/2020  Check In:  Session Check In - 09/09/20 1330      Check-In   Supervising physician immediately available to respond to emergencies See telemetry face sheet for immediately available ER MD    Location [facility]-Cardiac & Pulmonary Rehab    Staff Present Renita Papa, RN BSN;Joseph [address] [place], [place], [place], CCRP, CCET    Virtual Visit No    Medication changes reported     No    Fall or balance concerns reported    No    Warm-up and Cool-down Performed on first and last piece of equipment    Resistance Training Performed Yes    VAD Patient? No    PAD/SET Patient? No      Pain Assessment   Currently in Pain? No/denies              Social History   Tobacco Use  Smoking Status Former Smoker  . Packs/day: 1.00  . Years: 30.00  . Pack years: 30.00  . Types: Cigarettes  . Quit date: 01/21/2012  . Years since quitting: 8.6  Smokeless Tobacco Never Used    Goals Met:  Independence with exercise equipment Exercise tolerated well No report of cardiac concerns or symptoms Strength training completed today  Goals Unmet:  Not Applicable  Comments: Pt able to follow exercise prescription today without complaint.  Will continue to monitor for progression.    Dr. Emily Filbert is Medical Director for [place] and [facility].

## 2020-09-13 ENCOUNTER — Other Ambulatory Visit: Payer: Self-pay

## 2020-09-13 DIAGNOSIS — J449 Chronic obstructive pulmonary disease, unspecified: Secondary | ICD-10-CM | POA: Diagnosis not present

## 2020-09-13 NOTE — Progress Notes (Signed)
Daily Session Note  Patient Details  Name: Heidi [name] MRN: [id number] Date of Birth: 11-01-1955 Referring Provider:     Pulmonary Rehab from 08/16/2020 in [facility] Cardiac and Pulmonary Rehab  Referring Provider Heidi [name]      Encounter Date: 09/13/2020  Check In:  Session Check In - 09/13/20 [place]      Check-In   Supervising physician immediately available to respond to emergencies See telemetry face sheet for immediately available ER MD    Location [facility]-Cardiac & Pulmonary Rehab    Staff Present Birdie Sons, MPA, Mauricia Area, BS, ACSM CEP, Exercise Physiologist;Kara Eliezer Bottom, MS Exercise Physiologist    Virtual Visit No    Medication changes reported     No    Fall or balance concerns reported    No    Warm-up and Cool-down Performed on first and last piece of equipment    Resistance Training Performed Yes    VAD Patient? No    PAD/SET Patient? No      Pain Assessment   Currently in Pain? No/denies              Social History   Tobacco Use  Smoking Status Former Smoker  . Packs/day: 1.00  . Years: 30.00  . Pack years: 30.00  . Types: Cigarettes  . Quit date: 01/21/2012  . Years since quitting: 8.6  Smokeless Tobacco Never Used    Goals Met:  Independence with exercise equipment Exercise tolerated well No report of cardiac concerns or symptoms Strength training completed today  Goals Unmet:  Not Applicable  Comments: Pt able to follow exercise prescription today without complaint.  Will continue to monitor for progression.    Dr. Emily [name] is Medical Director for [place] and [facility].

## 2020-09-15 ENCOUNTER — Other Ambulatory Visit: Payer: Self-pay

## 2020-09-15 DIAGNOSIS — J449 Chronic obstructive pulmonary disease, unspecified: Secondary | ICD-10-CM

## 2020-09-15 NOTE — Progress Notes (Signed)
Daily Session Note  Patient Details  Name: Heidi [name] MRN: [id number] Date of Birth: Sep 30, 1956 Referring Provider:     Pulmonary Rehab from 08/16/2020 in [facility] Cardiac and Pulmonary Rehab  Referring Provider [name]      Encounter Date: 09/15/2020  Check In:  Session Check In - 09/15/20 1339      Check-In   Supervising physician immediately available to respond to emergencies See telemetry face sheet for immediately available ER MD    Location [facility]-Cardiac & Pulmonary Rehab    Staff Present Birdie Sons, MPA, Nino Glow, MS Exercise Physiologist;Jessica Luan Pulling, MA, RCEP, CCRP, CCET    Virtual Visit No    Medication changes reported     No    Fall or balance concerns reported    No    Warm-up and Cool-down Performed on first and last piece of equipment    Resistance Training Performed Yes    VAD Patient? No    PAD/SET Patient? No      Pain Assessment   Currently in Pain? No/denies              Social History   Tobacco Use  Smoking Status Former Smoker  . Packs/day: 1.00  . Years: 30.00  . Pack years: 30.00  . Types: Cigarettes  . Quit date: 01/21/2012  . Years since quitting: 8.6  Smokeless Tobacco Never Used    Goals Met:  Independence with exercise equipment Exercise tolerated well No report of cardiac concerns or symptoms Strength training completed today  Goals Unmet:  Not Applicable  Comments: Pt able to follow exercise prescription today without complaint.  Will continue to monitor for progression.  Reviewed home exercise with pt today.  Pt plans to walk and use weights for exercise.  We also talked about using staff videos for exericse.   Reviewed THR, pulse, RPE, sign and symptoms, pulse oximetery and when to call 911 or MD.  Also discussed weather considerations and indoor options.  Pt voiced understanding.   Dr. Emily Filbert is Medical Director for [place] and [facility].

## 2020-09-20 ENCOUNTER — Other Ambulatory Visit: Payer: Self-pay

## 2020-09-20 DIAGNOSIS — J449 Chronic obstructive pulmonary disease, unspecified: Secondary | ICD-10-CM

## 2020-09-20 NOTE — Progress Notes (Signed)
Daily Session Note  Patient Details  Name: Heidi [name] MRN: [id number] Date of Birth: 17-Jun-1956 Referring Provider:     Pulmonary Rehab from 08/16/2020 in [facility] Cardiac and Pulmonary Rehab  Referring Provider [name]      Encounter Date: 09/20/2020  Check In:  Session Check In - 09/20/20 1335      Check-In   Supervising physician immediately available to respond to emergencies See telemetry face sheet for immediately available ER MD    Location [facility]-Cardiac & Pulmonary Rehab    Staff Present Birdie Sons, MPA, Mauricia Area, BS, ACSM CEP, Exercise Physiologist;Kara Eliezer Bottom, MS Exercise Physiologist    Virtual Visit No    Medication changes reported     No    Fall or balance concerns reported    No    Warm-up and Cool-down Performed on first and last piece of equipment    Resistance Training Performed Yes    VAD Patient? No    PAD/SET Patient? No      Pain Assessment   Currently in Pain? No/denies              Social History   Tobacco Use  Smoking Status Former Smoker  . Packs/day: 1.00  . Years: 30.00  . Pack years: 30.00  . Types: Cigarettes  . Quit date: 01/21/2012  . Years since quitting: 8.6  Smokeless Tobacco Never Used    Goals Met:  Independence with exercise equipment Exercise tolerated well No report of cardiac concerns or symptoms Strength training completed today  Goals Unmet:  Not Applicable  Comments: Pt able to follow exercise prescription today without complaint.  Will continue to monitor for progression.    Dr. Emily Filbert is Medical Director for [place] and [facility].

## 2020-09-29 ENCOUNTER — Encounter: Payer: Medicare Other | Attending: Pulmonary Disease

## 2020-09-29 ENCOUNTER — Encounter: Payer: Self-pay | Admitting: *Deleted

## 2020-09-29 ENCOUNTER — Other Ambulatory Visit: Payer: Self-pay

## 2020-09-29 DIAGNOSIS — J449 Chronic obstructive pulmonary disease, unspecified: Secondary | ICD-10-CM | POA: Diagnosis present

## 2020-09-29 NOTE — Progress Notes (Signed)
Pulmonary Individual Treatment Plan  Patient Details  Name: Heidi [name] MRN: [id number] Date of Birth: [birth year] Referring Provider:     Pulmonary Rehab from 08/16/2020 in [facility] Cardiac and Pulmonary Rehab  Referring Provider Aleskerov      Initial Encounter Date:    Pulmonary Rehab from 08/16/2020 in [facility] Cardiac and Pulmonary Rehab  Date 08/16/20      Visit Diagnosis: Chronic obstructive pulmonary disease, unspecified COPD type ([place])  Patient's Home Medications on Admission:  Current Outpatient Medications:  .  albuterol (PROVENTIL HFA;VENTOLIN HFA) 108 (90 Base) MCG/ACT inhaler, Inhale 1-2 puffs into the lungs every 4 (four) hours as needed for wheezing or shortness of breath., Disp: , Rfl:  .  albuterol (PROVENTIL) (2.5 MG/3ML) 0.083% nebulizer solution, Take 3 mLs (2.5 mg total) by nebulization every 6 (six) hours as needed for wheezing or shortness of breath., Disp: 360 mL, Rfl: 5 .  ALPRAZolam (XANAX) 0.25 MG tablet, Take 0.25 mg by mouth 2 (two) times daily. , Disp: , Rfl:  .  azithromycin (ZITHROMAX) 250 MG tablet, Take 500 mg daily for 2 days more (Patient not taking: Reported on 08/11/2020), Disp: 4 tablet, Rfl: 0 .  escitalopram (LEXAPRO) 20 MG tablet, Take 20 mg by mouth daily., Disp: , Rfl:  .  fluticasone (FLONASE) 50 MCG/ACT nasal spray, USE 2 SPRAYS IN EACH NOSTRIL ONCE DAILY, Disp: 16 g, Rfl: 5 .  Fluticasone-Umeclidin-Vilant (TRELEGY ELLIPTA) 100-62.5-25 MCG/INH AEPB, Inhale 1 puff into the lungs daily., Disp: 1 each, Rfl: 5 .  Ipratropium-Albuterol (COMBIVENT RESPIMAT) 20-100 MCG/ACT AERS respimat, INHALE 1 PUFF BY MOUTH EVERY 6 HOURS AS NEEDED FOR WHEEZING, Disp: 4 g, Rfl: 5 .  mirtazapine (REMERON) 30 MG tablet, Take 30 mg by mouth at bedtime., Disp: , Rfl:  .  montelukast (SINGULAIR) 10 MG tablet, Take 10 mg by mouth at bedtime., Disp: , Rfl:   Past Medical History: Past Medical History:  Diagnosis Date  . Anxiety   . COPD (chronic obstructive pulmonary  disease) ([place])   . GERD (gastroesophageal reflux disease)   . Hypotension    limiting med titration  . NSTEMI (non-ST elevated myocardial infarction) [facility]) 2013   12/2011 with normal coronaries possibly secondary to Takotsubo (EF 30-35% by echo), occurred following two episodes of acute respiratory distress  . Pneumonia 11/2016   history of  . Takotsubo syndrome 4/13    Tobacco Use: Social History   Tobacco Use  Smoking Status Former Smoker  . Packs/day: 1.00  . Years: 30.00  . Pack years: 30.00  . Types: Cigarettes  . Quit date: 01/21/2012  . Years since quitting: 8.6  Smokeless Tobacco Never Used    Labs: Recent Review Scientist, physiological    Labs for ITP Cardiac and Pulmonary Rehab Latest Ref Rng & Units 08/08/2012 08/08/2012 04/04/2019 07/09/2020 07/09/2020   Cholestrol 0 - 200 mg/dL - - - - -   LDLCALC 0 - 99 mg/dL - - - - -   HDL >39 mg/dL - - - - -   Trlycerides <150 mg/dL - - - - -   Hemoglobin A1c <5.7 % - - - - -   PHART - 7.206(L) 7.332(L) 7.46 - -   PCO2ART mmHg 46.5(H) 47.3(H) 41 - -   HCO3 20.0 - 28.0 mmol/L 18.4(L) 25.1(H) 29.2 31.9(H) 31.6(H)   TCO2 0 - 100 mmol/L 20 27 - - -   ACIDBASEDEF 0.0 - 2.0 mmol/L 9.0(H) 1.0 - - -   O2SAT % 100.0 99.0 97.5 94.0  92.4       Pulmonary Assessment Scores:  Pulmonary Assessment Scores    Row Name 08/16/20 1618         ADL UCSD   ADL Phase Entry     SOB Score total 65     Rest 1     Walk 2     Stairs 5     Bath 1     Dress 1     Shop 2       CAT Score   CAT Score 29       mMRC Score   mMRC Score 3            UCSD: Self-administered rating of dyspnea associated with activities of daily living (ADLs) 6-point scale (0 = "not at all" to 5 = "maximal or unable to do because of breathlessness")  Scoring Scores range from 0 to 120.  Minimally important difference is 5 units  CAT: CAT can identify the health impairment of COPD patients and is better correlated with disease progression.  CAT has a scoring range  of zero to 40. The CAT score is classified into four groups of low (less than 10), medium (10 - 20), high (21-30) and very high (31-40) based on the impact level of disease on health status. A CAT score over 10 suggests significant symptoms.  A worsening CAT score could be explained by an exacerbation, poor medication adherence, poor inhaler technique, or progression of COPD or comorbid conditions.  CAT MCID is 2 points  mMRC: mMRC (Modified Medical Research Council) Dyspnea Scale is used to assess the degree of baseline functional disability in patients of respiratory disease due to dyspnea. No minimal important difference is established. A decrease in score of 1 point or greater is considered a positive change.   Pulmonary Function Assessment:   Exercise Target Goals: Exercise Program Goal: Individual exercise prescription set using results from initial 6 min walk test and THRR while considering  patient's activity barriers and safety.   Exercise Prescription Goal: Initial exercise prescription builds to 30-45 minutes a day of aerobic activity, 2-3 days per week.  Home exercise guidelines will be given to patient during program as part of exercise prescription that the participant will acknowledge.  Education: Aerobic Exercise: - Group verbal and visual presentation on the components of exercise prescription. Introduces F.I.T.T principle from ACSM for exercise prescriptions.  Reviews F.I.T.T. principles of aerobic exercise including progression. Written material given at graduation.   Education: Resistance Exercise: - Group verbal and visual presentation on the components of exercise prescription. Introduces F.I.T.T principle from ACSM for exercise prescriptions  Reviews F.I.T.T. principles of resistance exercise including progression. Written material given at graduation.    Education: Exercise & Equipment Safety: - Individual verbal instruction and demonstration of equipment use and  safety with use of the equipment.   Pulmonary [facility]15/[facility] Card[facility] Pulmonary Re[facility]  Date 08/16/20  Educator AS  Instruction Review Code 1- Verbalizes Understanding      Education: Exercise Physiology & General Exercise Guidelines: - Group verbal and written instruction with models to review the exercise physiology of the cardiovascular system and associated critical values. Provides general exercise guidelines with specific guidelines to those with heart or lung disease.    [facility]  Date 05/17/16  Educator Fabio PierceJessica Hawkins and Nobie PutnamAmanda Sommers  Instruction Review Code (retired) 2- meets goals/outcomes      Education: Flexibility, Development worker, international aidBalance, Mind/Body  Relaxation: - Group verbal and visual presentation with interactive activity on the components of exercise prescription. Introduces F.I.T.T principle from ACSM for exercise prescriptions. Reviews F.I.T.T. principles of flexibility and balance exercise training including progression. Also discusses the mind body connection.  Reviews various relaxation techniques to help reduce and manage stress (i.e. Deep breathing, progressive muscle relaxation, and visualization). Balance handout provided to take home. Written material given at graduation.   Pulmonary Rehab from 05/29/2016 in [facility] Cardiac and Pulmonary Rehab  Date 03/08/16  Educator Rhett Bannister, PT  Instruction Review Code (retired) 2- meets goals/outcomes      Activity Barriers & Risk Stratification:  Activity Barriers & Cardiac Risk Stratification - 08/11/20 1111      Activity Barriers & Cardiac Risk Stratification   Activity Barriers Shortness of Breath           6 Minute Walk:  6 Minute Walk    Row Name 08/16/20 1606         6 Minute Walk   Phase Initial     Distance 645 feet     Walk Time 5.33 minutes     # of Rest Breaks 3     MPH 1.4     METS 2.82     RPE 13     Perceived Dyspnea  3      VO2 Peak 9.9     Symptoms No     Resting HR 101 bpm     Resting BP 114/64     Resting Oxygen Saturation  96 %     Exercise Oxygen Saturation  during 6 min walk 93 %     Max Ex. HR 115 bpm     Max Ex. BP 188/78     2 Minute Post BP 118/68       Interval HR   1 Minute HR 100     2 Minute HR 110     3 Minute HR 110     4 Minute HR 115     5 Minute HR 109     6 Minute HR 104     2 Minute Post HR 107     Interval Heart Rate? Yes       Interval Oxygen   Interval Oxygen? Yes     Baseline Oxygen Saturation % 96 %     1 Minute Oxygen Saturation % 95 %     1 Minute Liters of Oxygen 3 L     2 Minute Oxygen Saturation % 94 %     2 Minute Liters of Oxygen 3 L     3 Minute Oxygen Saturation % 94 %     3 Minute Liters of Oxygen 3 L     4 Minute Oxygen Saturation % 94 %     4 Minute Liters of Oxygen 3 L     5 Minute Oxygen Saturation % 93 %     5 Minute Liters of Oxygen 3 L     6 Minute Oxygen Saturation % 93 %     6 Minute Liters of Oxygen 3 L     2 Minute Post Oxygen Saturation % 96 %     2 Minute Post Liters of Oxygen 3 L           Oxygen Initial Assessment:  Oxygen Initial Assessment - 08/11/20 1112      Home Oxygen   Home Oxygen Device Home Concentrator;Portable Concentrator    Sleep Oxygen Prescription Continuous  Liters per minute 3    Home Exercise Oxygen Prescription Continuous    Liters per minute 3    Home Resting Oxygen Prescription Continuous    Liters per minute 3    Compliance with Home Oxygen Use Yes      Intervention   Short Term Goals To learn and exhibit compliance with exercise, home and travel O2 prescription;To learn and understand importance of monitoring SPO2 with pulse oximeter and demonstrate accurate use of the pulse oximeter.;To learn and understand importance of maintaining oxygen saturations>88%;To learn and demonstrate proper pursed lip breathing techniques or other breathing techniques.;To learn and demonstrate proper use of respiratory  medications    Long  Term Goals Exhibits compliance with exercise, home and travel O2 prescription;Verbalizes importance of monitoring SPO2 with pulse oximeter and return demonstration;Maintenance of O2 saturations>88%;Compliance with respiratory medication;Demonstrates proper use of MDI's;Exhibits proper breathing techniques, such as pursed lip breathing or other method taught during program session           Oxygen Re-Evaluation:  Oxygen Re-Evaluation    Row Name 09/06/20 1408 09/09/20 1341           Program Oxygen Prescription   Program Oxygen Prescription -- E-Tanks;Continuous      Liters per minute -- 3        Home Oxygen   Home Oxygen Device Home Concentrator Home Concentrator      Sleep Oxygen Prescription Continuous Continuous      Liters per minute 3 3      Home Exercise Oxygen Prescription Continuous Pulsed      Liters per minute 3 3      Home Resting Oxygen Prescription Continuous Continuous      Liters per minute 3 3      Compliance with Home Oxygen Use Yes Yes        Goals/Expected Outcomes   Short Term Goals To learn and exhibit compliance with exercise, home and travel O2 prescription;To learn and understand importance of monitoring SPO2 with pulse oximeter and demonstrate accurate use of the pulse oximeter.;To learn and understand importance of maintaining oxygen saturations>88%;To learn and demonstrate proper pursed lip breathing techniques or other breathing techniques.;To learn and demonstrate proper use of respiratory medications To learn and exhibit compliance with exercise, home and travel O2 prescription;To learn and understand importance of monitoring SPO2 with pulse oximeter and demonstrate accurate use of the pulse oximeter.;To learn and understand importance of maintaining oxygen saturations>88%;To learn and demonstrate proper pursed lip breathing techniques or other breathing techniques.;To learn and demonstrate proper use of respiratory medications       Long  Term Goals Exhibits compliance with exercise, home and travel O2 prescription;Verbalizes importance of monitoring SPO2 with pulse oximeter and return demonstration;Maintenance of O2 saturations>88%;Compliance with respiratory medication;Demonstrates proper use of MDI's;Exhibits proper breathing techniques, such as pursed lip breathing or other method taught during program session Exhibits compliance with exercise, home and travel O2 prescription;Verbalizes importance of monitoring SPO2 with pulse oximeter and return demonstration;Maintenance of O2 saturations>88%;Compliance with respiratory medication;Demonstrates proper use of MDI's;Exhibits proper breathing techniques, such as pursed lip breathing or other method taught during program session      Comments Reviewed PLB technique with pt.  Talked about how it works and it's importance in maintaining their exercise saturations. Heidi [name] is compliant with her oxygen and using her PLB constantly. She has already noticed a difference in how she is feeling.      Goals/Expected Outcomes Short: Become more profiecient at using PLB.  Long: Become independent at using PLB. Short: Continue to use PLB to manage breathing with exercise Long; Continued compliance.             Oxygen Discharge (Final Oxygen Re-Evaluation):  Oxygen Re-Evaluation - 09/09/20 1341      Program Oxygen Prescription   Program Oxygen Prescription E-Tanks;Continuous    Liters per minute 3      Home Oxygen   Home Oxygen Device Home Concentrator    Sleep Oxygen Prescription Continuous    Liters per minute 3    Home Exercise Oxygen Prescription Pulsed    Liters per minute 3    Home Resting Oxygen Prescription Continuous    Liters per minute 3    Compliance with Home Oxygen Use Yes      Goals/Expected Outcomes   Short Term Goals To learn and exhibit compliance with exercise, home and travel O2 prescription;To learn and understand importance of monitoring SPO2 with pulse  oximeter and demonstrate accurate use of the pulse oximeter.;To learn and understand importance of maintaining oxygen saturations>88%;To learn and demonstrate proper pursed lip breathing techniques or other breathing techniques.;To learn and demonstrate proper use of respiratory medications    Long  Term Goals Exhibits compliance with exercise, home and travel O2 prescription;Verbalizes importance of monitoring SPO2 with pulse oximeter and return demonstration;Maintenance of O2 saturations>88%;Compliance with respiratory medication;Demonstrates proper use of MDI's;Exhibits proper breathing techniques, such as pursed lip breathing or other method taught during program session    Comments Heidi [name] is compliant with her oxygen and using her PLB constantly. She has already noticed a difference in how she is feeling.    Goals/Expected Outcomes Short: Continue to use PLB to manage breathing with exercise Long; Continued compliance.           Initial Exercise Prescription:  Initial Exercise Prescription - 08/16/20 1600      Date of Initial Exercise RX and Referring Provider   Date 08/16/20    Referring Provider Aleskerov      Treadmill   MPH 1    Grade 0    Minutes 15    METs 1.77      NuStep   Level 1    SPM 80    Minutes 15    METs 2      REL-XR   Level 1    Speed 50    Minutes 15    METs 2      Prescription Details   Frequency (times per week) 3    Duration Progress to 30 minutes of continuous aerobic without signs/symptoms of physical distress      Intensity   THRR 40-80% of Max Heartrate 123-145    Ratings of Perceived Exertion 11-15    Perceived Dyspnea 0-4      Resistance Training   Training Prescription Yes    Weight 3 lb    Reps 10-15           Perform Capillary Blood Glucose checks as needed.  Exercise Prescription Changes:  Exercise Prescription Changes    Row Name 08/16/20 1600 09/15/20 1300 09/15/20 1400         Response to Exercise   Blood Pressure  (Admit) 114/64 -- 150/60     Blood Pressure (Exercise) 188/78 -- 162/78     Blood Pressure (Exit) 118/68 -- 130/80     Heart Rate (Admit) 101 bpm -- 79 bpm     Heart Rate (Exercise) 115 bpm -- 106 bpm     Heart  Rate (Exit) 107 bpm -- 109 bpm     Oxygen Saturation (Admit) 96 % -- 98 %     Oxygen Saturation (Exercise) 93 % -- 97 %     Oxygen Saturation (Exit) 96 % -- 98 %     Rating of Perceived Exertion (Exercise) 13 -- 13     Perceived Dyspnea (Exercise) 3 -- 2.5     Symptoms none -- none     Duration -- -- Progress to 30 minutes of  aerobic without signs/symptoms of physical distress     Intensity -- -- THRR unchanged       Progression   Progression -- -- Continue to progress workloads to maintain intensity without signs/symptoms of physical distress.     Average METs -- -- 2.5       Resistance Training   Training Prescription -- -- Yes     Weight -- -- 5 lb     Reps -- -- 10-15       NuStep   Level -- -- 4     SPM -- -- 80     Minutes -- -- 15     METs -- -- 2.5       Home Exercise Plan   Plans to continue exercise at -- Home (comment)  walking, staff videos --     Frequency -- Add 1 additional day to program exercise sessions. --     Initial Home Exercises Provided -- 09/15/20 --            Exercise Comments:   Exercise Goals and Review:  Exercise Goals    Row Name 08/16/20 1616             Exercise Goals   Increase Physical Activity Yes       Intervention Provide advice, education, support and counseling about physical activity/exercise needs.;Develop an individualized exercise prescription for aerobic and resistive training based on initial evaluation findings, risk stratification, comorbidities and participant's personal goals.       Expected Outcomes Short Term: Attend rehab on a regular basis to increase amount of physical activity.;Long Term: Add in home exercise to make exercise part of routine and to increase amount of physical activity.;Long Term:  Exercising regularly at least 3-5 days a week.       Increase Strength and Stamina Yes       Intervention Provide advice, education, support and counseling about physical activity/exercise needs.;Develop an individualized exercise prescription for aerobic and resistive training based on initial evaluation findings, risk stratification, comorbidities and participant's personal goals.       Expected Outcomes Short Term: Increase workloads from initial exercise prescription for resistance, speed, and METs.;Short Term: Perform resistance training exercises routinely during rehab and add in resistance training at home;Long Term: Improve cardiorespiratory fitness, muscular endurance and strength as measured by increased METs and functional capacity ( )       Able to understand and use rate of perceived exertion (RPE) scale Yes       Intervention Provide education and explanation on how to use RPE scale       Expected Outcomes Short Term: Able to use RPE daily in rehab to express subjective intensity level;Long Term:  Able to use RPE to guide intensity level when exercising independently       Able to understand and use Dyspnea scale Yes       Intervention Provide education and explanation on how to use Dyspnea scale       Expected  Outcomes Short Term: Able to use Dyspnea scale daily in rehab to express subjective sense of shortness of breath during exertion;Long Term: Able to use Dyspnea scale to guide intensity level when exercising independently       Knowledge and understanding of Target Heart Rate Range (THRR) Yes       Intervention Provide education and explanation of THRR including how the numbers were predicted and where they are located for reference       Expected Outcomes Short Term: Able to state/look up THRR;Short Term: Able to use daily as guideline for intensity in rehab;Long Term: Able to use THRR to govern intensity when exercising independently       Able to check pulse independently Yes        Intervention Provide education and demonstration on how to check pulse in carotid and radial arteries.;Review the importance of being able to check your own pulse for safety during independent exercise       Expected Outcomes Short Term: Able to explain why pulse checking is important during independent exercise;Long Term: Able to check pulse independently and accurately       Understanding of Exercise Prescription Yes       Intervention Provide education, explanation, and written materials on patient's individual exercise prescription       Expected Outcomes Short Term: Able to explain program exercise prescription;Long Term: Able to explain home exercise prescription to exercise independently              Exercise Goals Re-Evaluation :  Exercise Goals Re-Evaluation    Row Name 09/06/20 1406 09/09/20 1334 09/15/20 1353         Exercise Goal Re-Evaluation   Exercise Goals Review Increase Physical Activity;Able to understand and use rate of perceived exertion (RPE) scale;Knowledge and understanding of Target Heart Rate Range (THRR);Understanding of Exercise Prescription;Increase Strength and Stamina;Able to understand and use Dyspnea scale;Able to check pulse independently Increase Physical Activity;Increase Strength and Stamina;Understanding of Exercise Prescription Increase Physical Activity;Increase Strength and Stamina;Understanding of Exercise Prescription;Able to understand and use rate of perceived exertion (RPE) scale;Able to understand and use Dyspnea scale;Knowledge and understanding of Target Heart Rate Range (THRR);Able to check pulse independently     Comments Reviewed RPE and dyspnea scales, THR and program prescription with pt today.  Pt voiced understanding and was given a copy of goals to take home. Heidi [name] is on her third full day of exercise.  She is on the recumbent elliptical for the first time and finding it tough.  She is already doing some hand weights at home.  We will  talk about  home exercise in about a week to add in class regularly first. Reviewed home exercise with pt today.  Pt plans to walk and use weights for exercise.  We also talked about using staff videos for exericse.   Reviewed THR, pulse, RPE, sign and symptoms, pulse oximetery and when to call 911 or MD.  Also discussed weather considerations and indoor options.  Pt voiced understanding.     Expected Outcomes Short: Use RPE daily to regulate intensity. Long: Follow program prescription in THR. Short: Attend regularly Long: Continue to follow program prescrition Short: Start to add in home exericse week after Thanksgiving  Long: COntinue to exericse independently            Discharge Exercise Prescription (Final Exercise Prescription Changes):  Exercise Prescription Changes - 09/15/20 1400      Response to Exercise   Blood Pressure (Admit) 150/60  Blood Pressure (Exercise) 162/78    Blood Pressure (Exit) 130/80    Heart Rate (Admit) 79 bpm    Heart Rate (Exercise) 106 bpm    Heart Rate (Exit) 109 bpm    Oxygen Saturation (Admit) 98 %    Oxygen Saturation (Exercise) 97 %    Oxygen Saturation (Exit) 98 %    Rating of Perceived Exertion (Exercise) 13    Perceived Dyspnea (Exercise) 2.5    Symptoms none    Duration Progress to 30 minutes of  aerobic without signs/symptoms of physical distress    Intensity THRR unchanged      Progression   Progression Continue to progress workloads to maintain intensity without signs/symptoms of physical distress.    Average METs 2.5      Resistance Training   Training Prescription Yes    Weight 5 lb    Reps 10-15      NuStep   Level 4    SPM 80    Minutes 15    METs 2.5           Nutrition:  Target Goals: Understanding of nutrition guidelines, daily intake of sodium 1500mg , cholesterol 200mg , calories 30% from fat and 7% or less from saturated fats, daily to have 5 or more servings of fruits and vegetables.  Education: All About  Nutrition: -Group instruction provided by verbal, written material, interactive activities, discussions, models, and posters to present general guidelines for heart healthy nutrition including fat, fiber, MyPlate, the role of sodium in heart healthy nutrition, utilization of the nutrition label, and utilization of this knowledge for meal planning. Follow up email sent as well. Written material given at graduation.   Pulmonary Rehab from 09/15/2020 in [facility] Cardiac and Pulmonary Rehab  Date 09/08/20  Educator [facility]  Instruction Review Code 1- Verbalizes Understanding      Biometrics:  Pre Biometrics - 08/16/20 1616      Pre Biometrics   Height 5\' 5"  (1.651 m)    Weight 149 lb 4.8 oz (67.7 kg)    BMI (Calculated) 24.84    Single Leg Stand 7.98 seconds            Nutrition Therapy Plan and Nutrition Goals:  Nutrition Therapy & Goals - 09/13/20 1358      Nutrition Therapy   Diet Heart healty, low Na, pulmonary MNT    Protein (specify units) 80g    Fiber 25 grams    Whole Grain Foods 3 servings    Saturated Fats 12 max. grams    Fruits and Vegetables 8 servings/day    Sodium 1.5 grams      Personal Nutrition Goals   Nutrition Goal ST: Add protein to breakfast - boiled egg or nut butter and to lunch - peanut butter, choose low sodium or no salt added canned food items LT: lose weight (she would like to lose her stomach), eat 8 fruits/vegetables per day, eat a rainbow of fruits and vegetables every week, meet protein needs    Comments She reports eating smaller portions. Coffee: 1 sweet and low and some creamer. B: cereal or bagel with cream cheese or bacon and eggs with grits or frozen waffles Snack: piece of fruit or nabs with soft drink (regular) D: meat with two vegetables. Meat: ground beef  to make meatloaf, chicken, seafood, pork. Red meat 2x/week). She reports liking all vegetables. Potatoes and corn. She likes peas, black eyed peas, navy beans, turnip greens. She will typically  have nonstarchy and starchy vegetables.  She will use some olive oil and not much grease. She will usually bake or airfry. S: ice cream sandwich, recently have been getting frozen real fruit popsicles. Discussed heart healthy eating and pulmonary MNT.      Intervention Plan   Intervention Prescribe, educate and counsel regarding individualized specific dietary modifications aiming towards targeted core components such as weight, hypertension, lipid management, diabetes, heart failure and other comorbidities.;Nutrition handout(s) given to patient.    Expected Outcomes Short Term Goal: Understand basic principles of dietary content, such as calories, fat, sodium, cholesterol and nutrients.;Short Term Goal: A plan has been developed with personal nutrition goals set during dietitian appointment.;Long Term Goal: Adherence to prescribed nutrition plan.           Nutrition Assessments:  Nutrition Assessments - 08/16/20 1619      MEDFICTS Scores   Pre Score 35          MEDIFICTS Score Key:  ?70 Need to make dietary changes   40-70 Heart Healthy Diet  ? 40 Therapeutic Level Cholesterol Diet   Picture Your Plate Scores:  <51 Unhealthy dietary pattern with much room for improvement.  41-50 Dietary pattern unlikely to meet recommendations for good health and room for improvement.  51-60 More healthful dietary pattern, with some room for improvement.   >60 Healthy dietary pattern, although there may be some specific behaviors that could be improved.   Nutrition Goals Re-Evaluation:  Nutrition Goals Re-Evaluation    Row Name 09/09/20 1336             Goals   Nutrition Goal set up meeting with Heidi [name]       Comment Shatara will schedule an appointment with [facility] for her diet.       Expected Outcome Meet with dietitian              Nutrition Goals Discharge (Final Nutrition Goals Re-Evaluation):  Nutrition Goals Re-Evaluation - 09/09/20 1336      Goals   Nutrition Goal  set up meeting with Heidi [name]    Comment Heidi [name] will schedule an appointment with [facility] for her diet.    Expected Outcome Meet with dietitian           Psychosocial: Target Goals: Acknowledge presence or absence of significant depression and/or stress, maximize coping skills, provide positive support system. Participant is able to verbalize types and ability to use techniques and skills needed for reducing stress and depression.   Education: Stress, Anxiety, and Depression - Group verbal and visual presentation to define topics covered.  Reviews how body is impacted by stress, anxiety, and depression.  Also discusses healthy ways to reduce stress and to treat/manage anxiety and depression.  Written material given at graduation.   Pulmonary Rehab from 05/29/2016 in [facility] Cardiac and Pulmonary Rehab  Date 04/12/16  Educator Jeannetta Ellis, [facility]  Instruction Review Code (retired) 2- meets goals/outcomes      Education: Sleep Hygiene -Provides group verbal and written instruction about how sleep can affect your health.  Define sleep hygiene, discuss sleep cycles and impact of sleep habits. Review good sleep hygiene tips.    Initial Review & Psychosocial Screening:  Initial Psych Review & Screening - 08/11/20 1114      Initial Review   Current issues with Current Depression;Current Psychotropic Meds;Current Stress Concerns    Source of Stress Concerns Unable to participate in former interests or hobbies;Unable to perform yard/household activities;Chronic Illness    Comments Chronic illness causing not able to do things  she would like to do.      Family Dynamics   Good Support System? Yes   Partner 7 years together in home, daughter not far about 10 min down the road.     Barriers   Psychosocial barriers to participate in program There are no identifiable barriers or psychosocial needs.;The patient should benefit from training in stress management and relaxation.      Screening  Interventions   Interventions Encouraged to exercise;Provide feedback about the scores to participant;To provide support and resources with identified psychosocial needs    Expected Outcomes Short Term goal: Utilizing psychosocial counselor, staff and physician to assist with identification of specific Stressors or current issues interfering with healing process. Setting desired goal for each stressor or current issue identified.;Long Term Goal: Stressors or current issues are controlled or eliminated.;Short Term goal: Identification and review with participant of any Quality of Life or Depression concerns found by scoring the questionnaire.;Long Term goal: The participant improves quality of Life and PHQ9 Scores as seen by post scores and/or verbalization of changes           Quality of Life Scores:  Scores of 19 and below usually indicate a poorer quality of life in these areas.  A difference of  2-3 points is a clinically meaningful difference.  A difference of 2-3 points in the total score of the Quality of Life Index has been associated with significant improvement in overall quality of life, self-image, physical symptoms, and general health in studies assessing change in quality of life.  PHQ-9: Recent Review Flowsheet Data    Depression screen [facility] 2/9 08/16/2020 05/26/2016 02/15/2016   Decreased Interest 1 0 0   Down, Depressed, Hopeless 1 1 0   PHQ - 2 Score 2 1 0   Altered sleeping 1 3 1    Tired, decreased energy 1 1 1    Change in appetite 0 0 1   Feeling bad or failure about yourself  0 0 0   Trouble concentrating 0 0 0   Moving slowly or fidgety/restless 0 0 0   Suicidal thoughts 0 0 0   PHQ-9 Score 4 5 3    Difficult doing work/chores Somewhat difficult Somewhat difficult Not difficult at all     Interpretation of Total Score  Total Score Depression Severity:  1-4 = Minimal depression, 5-9 = Mild depression, 10-14 = Moderate depression, 15-19 = Moderately severe depression,  20-27 = Severe depression   Psychosocial Evaluation and Intervention:  Psychosocial Evaluation - 08/11/20 1127      Psychosocial Evaluation & Interventions   Comments Heidi [name] has no barriers to entering the program. She has attended PR in the past and remembers how it helped her have more energy to manage her daily ADLs and chores.  She does hae stress over the chronic illness and her inablility to manage her daily activities and activities she would like to participate in daily. She is looking forward to exercising and feeling better.  She lives at home with her partner of 7 years. Her daughter lives very close by and is part of her support system. Her goal is to be able to move around more than she is right now. She is ready to start and work towards that goal.    Expected Outcomes STG: Heidi [name] is able to attend all scheduled sessions.  LTG: Kaedence benefits from the prgram in being able to manage more daily activiyies and decreasing her stress of not having energy to do what she likes.  Continue Psychosocial Services  Follow up required by staff           Psychosocial Re-Evaluation:  Psychosocial Re-Evaluation    Row Name 09/09/20 1336             Psychosocial Re-Evaluation   Current issues with Current Stress Concerns;Current Anxiety/Panic       Comments Heidi [name] is just getting started in rehab. She noticed her anxiety was really high her first day, but can already tell a difference in it calming down.  She knows that exercise is going to help her manage it better.  She still has a hard time sleeping at night.  She usually averages abour 4-5 hours a night.  She is not sure what keeps waking her at night. She has had a sleep study before.  She does have a fan that helps her sleep too.       Expected Outcomes Short: Continue to exercise to help with anxiety  Long: Continue to work on sleep       Interventions Encouraged to attend Pulmonary Rehabilitation for the exercise;Stress  management education       Continue Psychosocial Services  Follow up required by staff       Comments Chronic illness causing not able to do things she would like to do.         Initial Review   Source of Stress Concerns Unable to participate in former interests or hobbies;Unable to perform yard/household activities;Chronic Illness              Psychosocial Discharge (Final Psychosocial Re-Evaluation):  Psychosocial Re-Evaluation - 09/09/20 1336      Psychosocial Re-Evaluation   Current issues with Current Stress Concerns;Current Anxiety/Panic    Comments Heidi [name] is just getting started in rehab. She noticed her anxiety was really high her first day, but can already tell a difference in it calming down.  She knows that exercise is going to help her manage it better.  She still has a hard time sleeping at night.  She usually averages abour 4-5 hours a night.  She is not sure what keeps waking her at night. She has had a sleep study before.  She does have a fan that helps her sleep too.    Expected Outcomes Short: Continue to exercise to help with anxiety  Long: Continue to work on sleep    Interventions Encouraged to attend Pulmonary Rehabilitation for the exercise;Stress management education    Continue Psychosocial Services  Follow up required by staff    Comments Chronic illness causing not able to do things she would like to do.      Initial Review   Source of Stress Concerns Unable to participate in former interests or hobbies;Unable to perform yard/household activities;Chronic Illness           Education: Education Goals: Education classes will be provided on a weekly basis, covering required topics. Participant will state understanding/return demonstration of topics presented.  Learning Barriers/Preferences:   General Pulmonary Education Topics:  Infection Prevention: - Provides verbal and written material to individual with discussion of infection control including proper  hand washing and proper equipment cleaning during exercise session.   Pulmonary Rehab from 09/15/2020 in [facility] Cardiac and Pulmonary Rehab  Date 08/16/20  Educator AS  Instruction Review Code 1- Verbalizes Understanding      Falls Prevention: - Provides verbal and written material to individual with discussion of falls prevention and safety.   Pulmonary Rehab from 09/15/2020 in [facility]  Cardiac and Pulmonary Rehab  Date 08/16/20  Educator As  Instruction Review Code 1- Verbalizes Understanding      Chronic Lung Disease Review: - Group verbal instruction with posters, models, PowerPoint presentations and videos,  to review new updates, new respiratory medications, new advancements in procedures and treatments. Providing information on websites and "800" numbers for continued self-education. Includes information about supplement oxygen, available portable oxygen systems, continuous and intermittent flow rates, oxygen safety, concentrators, and Medicare reimbursement for oxygen. Explanation of Pulmonary Drugs, including class, frequency, complications, importance of spacers, rinsing mouth after steroid MDI's, and proper cleaning methods for nebulizers. Review of basic lung anatomy and physiology related to function, structure, and complications of lung disease. Review of risk factors. Discussion about methods for diagnosing sleep apnea and types of masks and machines for OSA. Includes a review of the use of types of environmental controls: home humidity, furnaces, filters, dust mite/pet prevention, HEPA vacuums. Discussion about weather changes, air quality and the benefits of nasal washing. Instruction on Warning signs, infection symptoms, calling MD promptly, preventive modes, and value of vaccinations. Review of effective airway clearance, coughing and/or vibration techniques. Emphasizing that all should Create an Action Plan. Written material given at graduation.   Pulmonary Rehab from 05/29/2016 in  [facility] Cardiac and Pulmonary Rehab  Date 03/06/16  Educator L. Brown,RT  Instruction Review Code (retired) 2- meets goals/outcomes      AED/CPR: - Group verbal and written instruction with the use of models to demonstrate the basic use of the AED with the basic ABC's of resuscitation.    Anatomy and Cardiac Procedures: - Group verbal and visual presentation and models provide information about basic cardiac anatomy and function. Reviews the testing methods done to diagnose heart disease and the outcomes of the test results. Describes the treatment choices: Medical Management, Angioplasty, or Coronary Bypass Surgery for treating various heart conditions including Myocardial Infarction, Angina, Valve Disease, and Cardiac Arrhythmias.  Written material given at graduation.   Medication Safety: - Group verbal and visual instruction to review commonly prescribed medications for heart and lung disease. Reviews the medication, class of the drug, and side effects. Includes the steps to properly store meds and maintain the prescription regimen.  Written material given at graduation.   Pulmonary Rehab from 09/15/2020 in [facility] Cardiac and Pulmonary Rehab  Date 09/15/20  Educator [facility]  Instruction Review Code 1- Verbalizes Understanding      Other: -Provides group and verbal instruction on various topics (see comments)   Knowledge Questionnaire Score:  Knowledge Questionnaire Score - 08/16/20 1619      Knowledge Questionnaire Score   Pre Score 16/18 oxygen            Core Components/Risk Factors/Patient Goals at Admission:  Personal Goals and Risk Factors at Admission - 08/16/20 1621      Core Components/Risk Factors/Patient Goals on Admission    Weight Management Yes    Intervention Weight Management: Develop a combined nutrition and exercise program designed to reach desired caloric intake, while maintaining appropriate intake of nutrient and fiber, sodium and fats, and appropriate  energy expenditure required for the weight goal.;Weight Management/Obesity: Establish reasonable short term and long term weight goals.;Weight Management: Provide education and appropriate resources to help participant work on and attain dietary goals.;Obesity: Provide education and appropriate resources to help participant work on and attain dietary goals.    Admit Weight 149 lb 4.8 oz (67.7 kg)    Goal Weight: Short Term 145 lb (65.8 kg)  Goal Weight: Long Term 130 lb (59 kg)    Expected Outcomes Short Term: Continue to assess and modify interventions until short term weight is achieved;Long Term: Adherence to nutrition and physical activity/exercise program aimed toward attainment of established weight goal;Weight Loss: Understanding of general recommendations for a balanced deficit meal plan, which promotes 1-2 lb weight loss per week and includes a negative energy balance of [id number] kcal/d    Improve shortness of breath with ADL's Yes           Education:Diabetes - Individual verbal and written instruction to review signs/symptoms of diabetes, desired ranges of glucose level fasting, after meals and with exercise. Acknowledge that pre and post exercise glucose checks will be done for 3 sessions at entry of program.   Know Your Numbers and Heart Failure: - Group verbal and visual instruction to discuss disease risk factors for cardiac and pulmonary disease and treatment options.  Reviews associated critical values for Overweight/Obesity, Hypertension, Cholesterol, and Diabetes.  Discusses basics of heart failure: signs/symptoms and treatments.  Introduces Heart Failure Zone chart for action plan for heart failure.  Written material given at graduation.   Core Components/Risk Factors/Patient Goals Review:   Goals and Risk Factor Review    Row Name 09/09/20 1339             Core Components/Risk Factors/Patient Goals Review   Personal Goals Review Weight Management/Obesity;Improve  shortness of breath with ADL's       Review Heidi [name] is off to a good start in rehab.  Her weight has been holding steady. She is trying to work on her breathing and can already tell exercise has helped with her anxiety. She will continue to use her PLB.       Expected Outcomes Short: Continue to work on weight loss Long; Continue to improve breathing.              Core Components/Risk Factors/Patient Goals at Discharge (Final Review):   Goals and Risk Factor Review - 09/09/20 1339      Core Components/Risk Factors/Patient Goals Review   Personal Goals Review Weight Management/Obesity;Improve shortness of breath with ADL's    Review Heidi [name] is off to a good start in rehab.  Her weight has been holding steady. She is trying to work on her breathing and can already tell exercise has helped with her anxiety. She will continue to use her PLB.    Expected Outcomes Short: Continue to work on weight loss Long; Continue to improve breathing.           ITP Comments:  ITP Comments    Row Name 08/11/20 1134 08/16/20 1624 09/01/20 0616 09/06/20 1406 09/29/20 0915   ITP Comments Virtual orientation call completed today. shehas an appointment on Date: 08/16/2020 for EP eval and gym Orientation.  Documentation of diagnosis can be found in [facility]  Date: [id number]. Completed and gym orientation. Initial ITP created and sent for review to Dr. Bethann Punches, Medical Director. 30 Day review completed. Medical Director ITP review done, changes made as directed, and signed approval by Medical Director. First full day of exercise!  Patient was oriented to gym and equipment including functions, settings, policies, and procedures.  Patient's individual exercise prescription and treatment plan were reviewed.  All starting workloads were established based on the results of the 6 minute walk test done at initial orientation visit.  The plan for exercise progression was also introduced and progression will be customized  based on patient's performance and  goals. 30 Day review completed. Medical Director ITP review done, changes made as directed, and signed approval by Medical Director.          Comments:

## 2020-09-29 NOTE — Progress Notes (Signed)
Daily Session Note  Patient Details  Name: Heidi [name] MRN: [id number] Date of Birth: Mar 02, 1956 Referring Provider:     Pulmonary Rehab from 08/16/2020 in [facility] Cardiac and Pulmonary Rehab  Referring Provider [name]      Encounter Date: 09/29/2020  Check In:  Session Check In - 09/29/20 1358      Check-In   Supervising physician immediately available to respond to emergencies See telemetry face sheet for immediately available ER MD    Location [facility]-Cardiac & Pulmonary Rehab    Staff Present Birdie Sons, MPA, Elveria Rising, BA, ACSM CEP, Exercise Physiologist;Kara Eliezer Bottom, MS Exercise Physiologist    Virtual Visit No    Medication changes reported     No    Fall or balance concerns reported    No    Warm-up and Cool-down Performed on first and last piece of equipment    Resistance Training Performed Yes    VAD Patient? No    PAD/SET Patient? No      Pain Assessment   Currently in Pain? No/denies              Social History   Tobacco Use  Smoking Status Former Smoker  . Packs/day: 1.00  . Years: 30.00  . Pack years: 30.00  . Types: Cigarettes  . Quit date: 01/21/2012  . Years since quitting: 8.6  Smokeless Tobacco Never Used    Goals Met:  Independence with exercise equipment Exercise tolerated well No report of cardiac concerns or symptoms Strength training completed today  Goals Unmet:  Not Applicable  Comments: Pt able to follow exercise prescription today without complaint.  Will continue to monitor for progression.    Dr. Emily Filbert is Medical Director for [place] and [facility].

## 2020-09-30 ENCOUNTER — Encounter: Payer: Medicare Other | Admitting: *Deleted

## 2020-09-30 DIAGNOSIS — J449 Chronic obstructive pulmonary disease, unspecified: Secondary | ICD-10-CM

## 2020-09-30 NOTE — Progress Notes (Signed)
Daily Session Note  Patient Details  Name: Alexius T Ostenson MRN: [id number] Date of Birth: 03/16/1956 Referring Provider:     Pulmonary Rehab from 08/16/2020 in [facility] Cardiac and Pulmonary Rehab  Referring Provider Aleskerov      Encounter Date: 09/30/2020  Check In:  Session Check In - 09/30/20 1402      Check-In   Supervising physician immediately available to respond to emergencies See telemetry face sheet for immediately available ER MD    Location [facility]-Cardiac & Pulmonary Rehab    Staff Present Meredith Craven, RN BSN;Joseph Hood RCP,RRT,BSRT;Melissa Caiola RDN, LDN    Virtual Visit No    Medication changes reported     No    Fall or balance concerns reported    No    Warm-up and Cool-down Performed on first and last piece of equipment    Resistance Training Performed Yes    VAD Patient? No    PAD/SET Patient? No      Pain Assessment   Currently in Pain? No/denies              Social History   Tobacco Use  Smoking Status Former Smoker  . Packs/day: 1.00  . Years: 30.00  . Pack years: 30.00  . Types: Cigarettes  . Quit date: 01/21/2012  . Years since quitting: 8.6  Smokeless Tobacco Never Used    Goals Met:  Independence with exercise equipment Exercise tolerated well No report of cardiac concerns or symptoms Strength training completed today  Goals Unmet:  Not Applicable  Comments: Pt able to follow exercise prescription today without complaint.  Will continue to monitor for progression.    Dr. Mark Miller is Medical Director for [facility] and [facility]. 

## 2020-10-12 ENCOUNTER — Other Ambulatory Visit: Payer: Self-pay | Admitting: Adult Health

## 2020-10-12 DIAGNOSIS — J432 Centrilobular emphysema: Secondary | ICD-10-CM

## 2020-10-13 ENCOUNTER — Encounter: Payer: Medicare Other | Admitting: *Deleted

## 2020-10-13 ENCOUNTER — Other Ambulatory Visit: Payer: Self-pay

## 2020-10-13 DIAGNOSIS — J449 Chronic obstructive pulmonary disease, unspecified: Secondary | ICD-10-CM | POA: Diagnosis not present

## 2020-10-13 NOTE — Progress Notes (Signed)
Daily Session Note  Patient Details  Name: Heidi [name] MRN: [id number] Date of Birth: 05-12-1956 Referring Provider:   Flowsheet Row Pulmonary Rehab from 08/16/2020 in [facility] Cardiac and Pulmonary Rehab  Referring Provider [name]      Encounter Date: 10/13/2020  Check In:  Session Check In - 10/13/20 1344      Check-In   Supervising physician immediately available to respond to emergencies See telemetry face sheet for immediately available ER MD    Location [facility]-Cardiac & Pulmonary Rehab    Staff Present Renita Papa, RN BSN;Joseph Lou Miner, [place] Exercise Physiologist    Virtual Visit No    Medication changes reported     No    Fall or balance concerns reported    No    Warm-up and Cool-down Performed on first and last piece of equipment    Resistance Training Performed Yes    VAD Patient? No    PAD/SET Patient? No      Pain Assessment   Currently in Pain? No/denies              Social History   Tobacco Use  Smoking Status Former Smoker  . Packs/day: 1.00  . Years: 30.00  . Pack years: 30.00  . Types: Cigarettes  . Quit date: 01/21/2012  . Years since quitting: 8.7  Smokeless Tobacco Never Used    Goals Met:  Independence with exercise equipment Exercise tolerated well No report of cardiac concerns or symptoms Strength training completed today  Goals Unmet:  Not Applicable  Comments: Pt able to follow exercise prescription today without complaint.  Will continue to monitor for progression.    Dr. Emily Filbert is Medical Director for [place] and [facility].

## 2020-10-27 ENCOUNTER — Encounter: Payer: Self-pay | Admitting: *Deleted

## 2020-10-27 DIAGNOSIS — J449 Chronic obstructive pulmonary disease, unspecified: Secondary | ICD-10-CM

## 2020-10-27 NOTE — Progress Notes (Signed)
Pulmonary Individual Treatment Plan  Patient Details  Name: Heidi [name] MRN: [id number] Date of Birth: [birth year] Referring Provider:   Flowsheet Row Pulmonary Rehab from 08/16/2020 in [facility] Cardiac and Pulmonary Rehab  Referring Provider Aleskerov      Initial Encounter Date:  Flowsheet Row Pulmonary Rehab from 08/16/2020 in [facility] Cardiac and Pulmonary Rehab  Date 08/16/20      Visit Diagnosis: Chronic obstructive pulmonary disease, unspecified COPD type (HCC)  Patient's Home Medications on Admission:  Current Outpatient Medications:  .  albuterol (PROVENTIL HFA;VENTOLIN HFA) 108 (90 Base) MCG/ACT inhaler, Inhale 1-2 puffs into the lungs every 4 (four) hours as needed for wheezing or shortness of breath., Disp: , Rfl:  .  albuterol (PROVENTIL) (2.5 MG/3ML) 0.083% nebulizer solution, Take 3 mLs (2.5 mg total) by nebulization every 6 (six) hours as needed for wheezing or shortness of breath., Disp: 360 mL, Rfl: 5 .  ALPRAZolam (XANAX) 0.25 MG tablet, Take 0.25 mg by mouth 2 (two) times daily. , Disp: , Rfl:  .  azithromycin (ZITHROMAX) 250 MG tablet, Take 500 mg daily for 2 days more (Patient not taking: Reported on 08/11/2020), Disp: 4 tablet, Rfl: 0 .  escitalopram (LEXAPRO) 20 MG tablet, Take 20 mg by mouth daily., Disp: , Rfl:  .  fluticasone (FLONASE) 50 MCG/ACT nasal spray, USE 2 SPRAYS IN EACH NOSTRIL ONCE DAILY, Disp: 16 g, Rfl: 5 .  Fluticasone-Umeclidin-Vilant (TRELEGY ELLIPTA) 100-62.5-25 MCG/INH AEPB, Inhale 1 puff into the lungs daily. Pt needs appt for further refills., Disp: 180 each, Rfl: 0 .  Ipratropium-Albuterol (COMBIVENT RESPIMAT) 20-100 MCG/ACT AERS respimat, INHALE 1 PUFF BY MOUTH EVERY 6 HOURS AS NEEDED FOR WHEEZING, Disp: 4 g, Rfl: 5 .  mirtazapine (REMERON) 30 MG tablet, Take 30 mg by mouth at bedtime., Disp: , Rfl:  .  montelukast (SINGULAIR) 10 MG tablet, Take 10 mg by mouth at bedtime., Disp: , Rfl:   Past Medical History: Past Medical History:   Diagnosis Date  . Anxiety   . COPD (chronic obstructive pulmonary disease) (HCC)   . GERD (gastroesophageal reflux disease)   . Hypotension    limiting med titration  . NSTEMI (non-ST elevated myocardial infarction) [facility]) 2013   12/2011 with normal coronaries possibly secondary to Takotsubo (EF 30-35% by echo), occurred following two episodes of acute respiratory distress  . Pneumonia 11/2016   history of  . Takotsubo syndrome 4/13    Tobacco Use: Social History   Tobacco Use  Smoking Status Former Smoker  . Packs/day: 1.00  . Years: 30.00  . Pack years: 30.00  . Types: Cigarettes  . Quit date: 01/21/2012  . Years since quitting: 8.7  Smokeless Tobacco Never Used    Labs: Recent Review Advice worker    Labs for ITP Cardiac and Pulmonary Rehab Latest Ref Rng & Units 08/08/2012 08/08/2012 04/04/2019 07/09/2020 07/09/2020   Cholestrol 0 - 200 mg/dL - - - - -   LDLCALC 0 - 99 mg/dL - - - - -   HDL >40 mg/dL - - - - -   Trlycerides <150 mg/dL - - - - -   Hemoglobin A1c <5.7 % - - - - -   PHART - 7.206(L) 7.332(L) 7.46 - -   PCO2ART mmHg 46.5(H) 47.3(H) 41 - -   HCO3 20.0 - 28.0 mmol/L 18.4(L) 25.1(H) 29.2 31.9(H) 31.6(H)   TCO2 0 - 100 mmol/L 20 27 - - -   ACIDBASEDEF 0.0 - 2.0 mmol/L 9.0(H) 1.0 - - -  O2SAT % 100.0 99.0 97.5 94.0 92.4       Pulmonary Assessment Scores:  Pulmonary Assessment Scores    Row Name 08/16/20 1618         ADL UCSD   ADL Phase Entry     SOB Score total 65     Rest 1     Walk 2     Stairs 5     Bath 1     Dress 1     Shop 2           CAT Score   CAT Score 29           mMRC Score   mMRC Score 3            UCSD: Self-administered rating of dyspnea associated with activities of daily living (ADLs) 6-point scale (0 = "not at all" to 5 = "maximal or unable to do because of breathlessness")  Scoring Scores range from 0 to 120.  Minimally important difference is 5 units  CAT: CAT can identify the health impairment of COPD  patients and is better correlated with disease progression.  CAT has a scoring range of zero to 40. The CAT score is classified into four groups of low (less than 10), medium (10 - 20), high (21-30) and very high (31-40) based on the impact level of disease on health status. A CAT score over 10 suggests significant symptoms.  A worsening CAT score could be explained by an exacerbation, poor medication adherence, poor inhaler technique, or progression of COPD or comorbid conditions.  CAT MCID is 2 points  mMRC: mMRC (Modified Medical Research Council) Dyspnea Scale is used to assess the degree of baseline functional disability in patients of respiratory disease due to dyspnea. No minimal important difference is established. A decrease in score of 1 point or greater is considered a positive change.   Pulmonary Function Assessment:   Exercise Target Goals: Exercise Program Goal: Individual exercise prescription set using results from initial 6 min walk test and THRR while considering  patient's activity barriers and safety.   Exercise Prescription Goal: Initial exercise prescription builds to 30-45 minutes a day of aerobic activity, 2-3 days per week.  Home exercise guidelines will be given to patient during program as part of exercise prescription that the participant will acknowledge.  Education: Aerobic Exercise: - Group verbal and visual presentation on the components of exercise prescription. Introduces F.I.T.T principle from ACSM for exercise prescriptions.  Reviews F.I.T.T. principles of aerobic exercise including progression. Written material given at graduation.   Education: Resistance Exercise: - Group verbal and visual presentation on the components of exercise prescription. Introduces F.I.T.T principle from ACSM for exercise prescriptions  Reviews F.I.T.T. principles of resistance exercise including progression. Written material given at graduation.    Education: Exercise &  Equipment Safety: - Individual verbal instruction and demonstration of equipment use and safety with use of the equipment. Flowsheet Row Pulmonary Rehab from 10/13/2020 in [facility] Cardiac and Pulmonary Rehab  Date 08/16/20  Educator AS  Instruction Review Code 1- Verbalizes Understanding      Education: Exercise Physiology & General Exercise Guidelines: - Group verbal and written instruction with models to review the exercise physiology of the cardiovascular system and associated critical values. Provides general exercise guidelines with specific guidelines to those with heart or lung disease.  Flowsheet Row Pulmonary Rehab from 10/13/2020 in [facility] Cardiac and Pulmonary Rehab  Date 10/13/20  Educator [facility]  Instruction Review Code 1- Verbalizes Understanding  Education: Flexibility, Balance, Mind/Body Relaxation: - Group verbal and visual presentation with interactive activity on the components of exercise prescription. Introduces F.I.T.T principle from ACSM for exercise prescriptions. Reviews F.I.T.T. principles of flexibility and balance exercise training including progression. Also discusses the mind body connection.  Reviews various relaxation techniques to help reduce and manage stress (i.e. Deep breathing, progressive muscle relaxation, and visualization). Balance handout provided to take home. Written material given at graduation. Flowsheet Row Pulmonary Rehab from 05/29/2016 in [facility] Cardiac and Pulmonary Rehab  Date 03/08/16  Educator Rhett Bannister, PT  Instruction Review Code (retired) 2- meets goals/outcomes      Activity Barriers & Risk Stratification:  Activity Barriers & Cardiac Risk Stratification - 08/11/20 1111      Activity Barriers & Cardiac Risk Stratification   Activity Barriers Shortness of Breath           6 Minute Walk:  6 Minute Walk    Row Name 08/16/20 1606         6 Minute Walk   Phase Initial     Distance 645 feet     Walk Time 5.33 minutes     #  of Rest Breaks 3     MPH 1.4     METS 2.82     RPE 13     Perceived Dyspnea  3     VO2 Peak 9.9     Symptoms No     Resting HR 101 bpm     Resting BP 114/64     Resting Oxygen Saturation  96 %     Exercise Oxygen Saturation  during 6 min walk 93 %     Max Ex. HR 115 bpm     Max Ex. BP 188/78     2 Minute Post BP 118/68           Interval HR   1 Minute HR 100     2 Minute HR 110     3 Minute HR 110     4 Minute HR 115     5 Minute HR 109     6 Minute HR 104     2 Minute Post HR 107     Interval Heart Rate? Yes           Interval Oxygen   Interval Oxygen? Yes     Baseline Oxygen Saturation % 96 %     1 Minute Oxygen Saturation % 95 %     1 Minute Liters of Oxygen 3 L     2 Minute Oxygen Saturation % 94 %     2 Minute Liters of Oxygen 3 L     3 Minute Oxygen Saturation % 94 %     3 Minute Liters of Oxygen 3 L     4 Minute Oxygen Saturation % 94 %     4 Minute Liters of Oxygen 3 L     5 Minute Oxygen Saturation % 93 %     5 Minute Liters of Oxygen 3 L     6 Minute Oxygen Saturation % 93 %     6 Minute Liters of Oxygen 3 L     2 Minute Post Oxygen Saturation % 96 %     2 Minute Post Liters of Oxygen 3 L           Oxygen Initial Assessment:  Oxygen Initial Assessment - 08/11/20 1112      Home Oxygen   Home Oxygen Device Home  Concentrator;Portable Concentrator    Sleep Oxygen Prescription Continuous    Liters per minute 3    Home Exercise Oxygen Prescription Continuous    Liters per minute 3    Home Resting Oxygen Prescription Continuous    Liters per minute 3    Compliance with Home Oxygen Use Yes      Intervention   Short Term Goals To learn and exhibit compliance with exercise, home and travel O2 prescription;To learn and understand importance of monitoring SPO2 with pulse oximeter and demonstrate accurate use of the pulse oximeter.;To learn and understand importance of maintaining oxygen saturations>88%;To learn and demonstrate proper pursed lip breathing  techniques or other breathing techniques.;To learn and demonstrate proper use of respiratory medications    Long  Term Goals Exhibits compliance with exercise, home and travel O2 prescription;Verbalizes importance of monitoring SPO2 with pulse oximeter and return demonstration;Maintenance of O2 saturations>88%;Compliance with respiratory medication;Demonstrates proper use of MDI's;Exhibits proper breathing techniques, such as pursed lip breathing or other method taught during program session           Oxygen Re-Evaluation:  Oxygen Re-Evaluation    Row Name 09/06/20 1408 09/09/20 1341 10/13/20 1402         Program Oxygen Prescription   Program Oxygen Prescription -- E-Tanks;Continuous E-Tanks;Continuous     Liters per minute -- 3 3           Home Oxygen   Home Oxygen Device Home Concentrator Home Concentrator Home Concentrator     Sleep Oxygen Prescription Continuous Continuous Continuous     Liters per minute 3 3 3      Home Exercise Oxygen Prescription Continuous Pulsed Continuous     Liters per minute 3 3 3      Home Resting Oxygen Prescription Continuous Continuous Continuous     Liters per minute 3 3 3      Compliance with Home Oxygen Use Yes Yes Yes           Goals/Expected Outcomes   Short Term Goals To learn and exhibit compliance with exercise, home and travel O2 prescription;To learn and understand importance of monitoring SPO2 with pulse oximeter and demonstrate accurate use of the pulse oximeter.;To learn and understand importance of maintaining oxygen saturations>88%;To learn and demonstrate proper pursed lip breathing techniques or other breathing techniques.;To learn and demonstrate proper use of respiratory medications To learn and exhibit compliance with exercise, home and travel O2 prescription;To learn and understand importance of monitoring SPO2 with pulse oximeter and demonstrate accurate use of the pulse oximeter.;To learn and understand importance of maintaining  oxygen saturations>88%;To learn and demonstrate proper pursed lip breathing techniques or other breathing techniques.;To learn and demonstrate proper use of respiratory medications To learn and exhibit compliance with exercise, home and travel O2 prescription;To learn and understand importance of monitoring SPO2 with pulse oximeter and demonstrate accurate use of the pulse oximeter.;To learn and understand importance of maintaining oxygen saturations>88%;To learn and demonstrate proper pursed lip breathing techniques or other breathing techniques.;To learn and demonstrate proper use of respiratory medications     Long  Term Goals Exhibits compliance with exercise, home and travel O2 prescription;Verbalizes importance of monitoring SPO2 with pulse oximeter and return demonstration;Maintenance of O2 saturations>88%;Compliance with respiratory medication;Demonstrates proper use of MDI's;Exhibits proper breathing techniques, such as pursed lip breathing or other method taught during program session Exhibits compliance with exercise, home and travel O2 prescription;Verbalizes importance of monitoring SPO2 with pulse oximeter and return demonstration;Maintenance of O2 saturations>88%;Compliance with respiratory medication;Demonstrates proper use of  MDI's;Exhibits proper breathing techniques, such as pursed lip breathing or other method taught during program session Exhibits compliance with exercise, home and travel O2 prescription;Verbalizes importance of monitoring SPO2 with pulse oximeter and return demonstration;Maintenance of O2 saturations>88%;Compliance with respiratory medication;Demonstrates proper use of MDI's;Exhibits proper breathing techniques, such as pursed lip breathing or other method taught during program session     Comments Reviewed PLB technique with pt.  Talked about how it works and it's importance in maintaining their exercise saturations. Heidi [name] is compliant with her oxygen and using her  PLB constantly. She has already noticed a difference in how she is feeling. Heidi [name] is compliant with her oxygen and using her PLB constantly. Sometimes she will take it off when she is resting to giver herself a break. She reports her breathing is a little bit better with her ADLs.     Goals/Expected Outcomes Short: Become more profiecient at using PLB.   Long: Become independent at using PLB. Short: Continue to use PLB to manage breathing with exercise Long; Continued compliance. Short: Continue to use PLB to manage breathing with exercise Long; Continued compliance.            Oxygen Discharge (Final Oxygen Re-Evaluation):  Oxygen Re-Evaluation - 10/13/20 1402      Program Oxygen Prescription   Program Oxygen Prescription E-Tanks;Continuous    Liters per minute 3      Home Oxygen   Home Oxygen Device Home Concentrator    Sleep Oxygen Prescription Continuous    Liters per minute 3    Home Exercise Oxygen Prescription Continuous    Liters per minute 3    Home Resting Oxygen Prescription Continuous    Liters per minute 3    Compliance with Home Oxygen Use Yes      Goals/Expected Outcomes   Short Term Goals To learn and exhibit compliance with exercise, home and travel O2 prescription;To learn and understand importance of monitoring SPO2 with pulse oximeter and demonstrate accurate use of the pulse oximeter.;To learn and understand importance of maintaining oxygen saturations>88%;To learn and demonstrate proper pursed lip breathing techniques or other breathing techniques.;To learn and demonstrate proper use of respiratory medications    Long  Term Goals Exhibits compliance with exercise, home and travel O2 prescription;Verbalizes importance of monitoring SPO2 with pulse oximeter and return demonstration;Maintenance of O2 saturations>88%;Compliance with respiratory medication;Demonstrates proper use of MDI's;Exhibits proper breathing techniques, such as pursed lip breathing or other method  taught during program session    Comments Heidi [name] is compliant with her oxygen and using her PLB constantly. Sometimes she will take it off when she is resting to giver herself a break. She reports her breathing is a little bit better with her ADLs.    Goals/Expected Outcomes Short: Continue to use PLB to manage breathing with exercise Long; Continued compliance.           Initial Exercise Prescription:  Initial Exercise Prescription - 08/16/20 1600      Date of Initial Exercise RX and Referring Provider   Date 08/16/20    Referring Provider Aleskerov      Treadmill   MPH 1    Grade 0    Minutes 15    METs 1.77      NuStep   Level 1    SPM 80    Minutes 15    METs 2      REL-XR   Level 1    Speed 50    Minutes 15  METs 2      Prescription Details   Frequency (times per week) 3    Duration Progress to 30 minutes of continuous aerobic without signs/symptoms of physical distress      Intensity   THRR 40-80% of Max Heartrate 123-145    Ratings of Perceived Exertion 11-15    Perceived Dyspnea 0-4      Resistance Training   Training Prescription Yes    Weight 3 lb    Reps 10-15           Perform Capillary Blood Glucose checks as needed.  Exercise Prescription Changes:  Exercise Prescription Changes    Row Name 08/16/20 1600 09/15/20 1300 09/15/20 1400 09/30/20 1500 10/13/20 1700     Response to Exercise   Blood Pressure (Admit) 114/64 -- 150/60 144/68 152/78   Blood Pressure (Exercise) 188/78 -- 162/78 150/74 146/76   Blood Pressure (Exit) 118/68 -- 130/80 148/76 140/78   Heart Rate (Admit) 101 bpm -- 79 bpm 99 bpm 113 bpm   Heart Rate (Exercise) 115 bpm -- 106 bpm 105 bpm 109 bpm   Heart Rate (Exit) 107 bpm -- 109 bpm 94 bpm 98 bpm   Oxygen Saturation (Admit) 96 % -- 98 % 91 % 90 %   Oxygen Saturation (Exercise) 93 % -- 97 % 94 % 90 %   Oxygen Saturation (Exit) 96 % -- 98 % 96 % 96 %   Rating of Perceived Exertion (Exercise) 13 -- 13 13 12    Perceived  Dyspnea (Exercise) 3 -- 2.5 1 1    Symptoms none -- none -- --   Duration -- -- Progress to 30 minutes of  aerobic without signs/symptoms of physical distress Continue with 30 min of aerobic exercise without signs/symptoms of physical distress. Continue with 30 min of aerobic exercise without signs/symptoms of physical distress.   Intensity -- -- THRR unchanged THRR unchanged THRR unchanged     Progression   Progression -- -- Continue to progress workloads to maintain intensity without signs/symptoms of physical distress. Continue to progress workloads to maintain intensity without signs/symptoms of physical distress. Continue to progress workloads to maintain intensity without signs/symptoms of physical distress.   Average METs -- -- 2.5 2 2      Resistance Training   Training Prescription -- -- Yes Yes Yes   Weight -- -- 5 lb 5 lb 3 lb   Reps -- -- 10-15 10-15 10-15     Interval Training   Interval Training -- -- -- No --     NuStep   Level -- -- 4 -- 4   SPM -- -- 80 -- 80   Minutes -- -- 15 -- 15   METs -- -- 2.5 -- 2     Biostep-RELP   Level -- -- -- 2 --   SPM -- -- -- 50 --   Minutes -- -- -- 15 --   METs -- -- -- 2 --     Home Exercise Plan   Plans to continue exercise at -- Home (comment)  walking, staff videos -- Home (comment)  walking, staff videos --   Frequency -- Add 1 additional day to program exercise sessions. -- Add 1 additional day to program exercise sessions. --   Initial Home Exercises Provided -- 09/15/20 -- 09/15/20 --          Exercise Comments:   Exercise Goals and Review:  Exercise Goals    Row Name 08/16/20 1616  Exercise Goals   Increase Physical Activity Yes       Intervention Provide advice, education, support and counseling about physical activity/exercise needs.;Develop an individualized exercise prescription for aerobic and resistive training based on initial evaluation findings, risk stratification, comorbidities and  participant's personal goals.       Expected Outcomes Short Term: Attend rehab on a regular basis to increase amount of physical activity.;Long Term: Add in home exercise to make exercise part of routine and to increase amount of physical activity.;Long Term: Exercising regularly at least 3-5 days a week.       Increase Strength and Stamina Yes       Intervention Provide advice, education, support and counseling about physical activity/exercise needs.;Develop an individualized exercise prescription for aerobic and resistive training based on initial evaluation findings, risk stratification, comorbidities and participant's personal goals.       Expected Outcomes Short Term: Increase workloads from initial exercise prescription for resistance, speed, and METs.;Short Term: Perform resistance training exercises routinely during rehab and add in resistance training at home;Long Term: Improve cardiorespiratory fitness, muscular endurance and strength as measured by increased METs and functional capacity ( )       Able to understand and use rate of perceived exertion (RPE) scale Yes       Intervention Provide education and explanation on how to use RPE scale       Expected Outcomes Short Term: Able to use RPE daily in rehab to express subjective intensity level;Long Term:  Able to use RPE to guide intensity level when exercising independently       Able to understand and use Dyspnea scale Yes       Intervention Provide education and explanation on how to use Dyspnea scale       Expected Outcomes Short Term: Able to use Dyspnea scale daily in rehab to express subjective sense of shortness of breath during exertion;Long Term: Able to use Dyspnea scale to guide intensity level when exercising independently       Knowledge and understanding of Target Heart Rate Range (THRR) Yes       Intervention Provide education and explanation of THRR including how the numbers were predicted and where they are located for  reference       Expected Outcomes Short Term: Able to state/look up THRR;Short Term: Able to use daily as guideline for intensity in rehab;Long Term: Able to use THRR to govern intensity when exercising independently       Able to check pulse independently Yes       Intervention Provide education and demonstration on how to check pulse in carotid and radial arteries.;Review the importance of being able to check your own pulse for safety during independent exercise       Expected Outcomes Short Term: Able to explain why pulse checking is important during independent exercise;Long Term: Able to check pulse independently and accurately       Understanding of Exercise Prescription Yes       Intervention Provide education, explanation, and written materials on patient's individual exercise prescription       Expected Outcomes Short Term: Able to explain program exercise prescription;Long Term: Able to explain home exercise prescription to exercise independently              Exercise Goals Re-Evaluation :  Exercise Goals Re-Evaluation    Row Name 09/06/20 1406 09/09/20 1334 09/15/20 1353 09/30/20 1521 10/13/20 1354     Exercise Goal Re-Evaluation   Exercise Goals Review  Increase Physical Activity;Able to understand and use rate of perceived exertion (RPE) scale;Knowledge and understanding of Target Heart Rate Range (THRR);Understanding of Exercise Prescription;Increase Strength and Stamina;Able to understand and use Dyspnea scale;Able to check pulse independently Increase Physical Activity;Increase Strength and Stamina;Understanding of Exercise Prescription Increase Physical Activity;Increase Strength and Stamina;Understanding of Exercise Prescription;Able to understand and use rate of perceived exertion (RPE) scale;Able to understand and use Dyspnea scale;Knowledge and understanding of Target Heart Rate Range (THRR);Able to check pulse independently Increase Physical Activity;Increase Strength and  Stamina Increase Physical Activity;Increase Strength and Stamina   Comments Reviewed RPE and dyspnea scales, THR and program prescription with pt today.  Pt voiced understanding and was given a copy of goals to take home. Heidi [name] is on her third full day of exercise.  She is on the recumbent elliptical for the first time and finding it tough.  She is already doing some hand weights at home.  We will talk about  home exercise in about a week to add in class regularly first. Reviewed home exercise with pt today.  Pt plans to walk and use weights for exercise.  We also talked about using staff videos for exericse.   Reviewed THR, pulse, RPE, sign and symptoms, pulse oximetery and when to call 911 or MD.  Also discussed weather considerations and indoor options.  Pt voiced understanding. Heidi [name] is doing well with exercise.  Oxygen has been in the 90s during exercise.  We will continue to monitor progress. Heidi [name] reports doing upper body exercise videos (weighted - 3 lbs) - just started - would like to do it 2x/week. She reports walking a little bit - shereports anxeity gets in her way - especially when [place]. She got a referral to talk to someone regarding her anxiety.   Expected Outcomes Short: Use RPE daily to regulate intensity. Long: Follow program prescription in THR. Short: Attend regularly Long: Continue to follow program prescrition Short: Start to add in home exericse week after Thanksgiving  Long: COntinue to exericse independently Short:  attend consistently Long:  increase overall stamina ST: continue to do weights at home and attend rehab, see specialist regarding anxiety to help with anxiety and going to the gym LT: 150 minutes of moderate activity and 2x/week of weights          Discharge Exercise Prescription (Final Exercise Prescription Changes):  Exercise Prescription Changes - 10/13/20 1700      Response to Exercise   Blood Pressure (Admit) 152/78    Blood  Pressure (Exercise) 146/76    Blood Pressure (Exit) 140/78    Heart Rate (Admit) 113 bpm    Heart Rate (Exercise) 109 bpm    Heart Rate (Exit) 98 bpm    Oxygen Saturation (Admit) 90 %    Oxygen Saturation (Exercise) 90 %    Oxygen Saturation (Exit) 96 %    Rating of Perceived Exertion (Exercise) 12    Perceived Dyspnea (Exercise) 1    Duration Continue with 30 min of aerobic exercise without signs/symptoms of physical distress.    Intensity THRR unchanged      Progression   Progression Continue to progress workloads to maintain intensity without signs/symptoms of physical distress.    Average METs 2      Resistance Training   Training Prescription Yes    Weight 3 lb    Reps 10-15      NuStep   Level 4    SPM 80  Minutes 15    METs 2           Nutrition:  Target Goals: Understanding of nutrition guidelines, daily intake of sodium 1500mg , cholesterol 200mg , calories 30% from fat and 7% or less from saturated fats, daily to have 5 or more servings of fruits and vegetables.  Education: All About Nutrition: -Group instruction provided by verbal, written material, interactive activities, discussions, models, and posters to present general guidelines for heart healthy nutrition including fat, fiber, MyPlate, the role of sodium in heart healthy nutrition, utilization of the nutrition label, and utilization of this knowledge for meal planning. Follow up email sent as well. Written material given at graduation. Flowsheet Row Pulmonary Rehab from 10/13/2020 in [facility] Cardiac and Pulmonary Rehab  Date 09/08/20  Educator [facility]  Instruction Review Code 1- Verbalizes Understanding      Biometrics:  Pre Biometrics - 08/16/20 1616      Pre Biometrics   Height 5\' 5"  (1.651 m)    Weight 149 lb 4.8 oz (67.7 kg)    BMI (Calculated) 24.84    Single Leg Stand 7.98 seconds            Nutrition Therapy Plan and Nutrition Goals:  Nutrition Therapy & Goals - 09/13/20 1358       Nutrition Therapy   Diet Heart healty, low Na, pulmonary MNT    Protein (specify units) 80g    Fiber 25 grams    Whole Grain Foods 3 servings    Saturated Fats 12 max. grams    Fruits and Vegetables 8 servings/day    Sodium 1.5 grams      Personal Nutrition Goals   Nutrition Goal ST: Add protein to breakfast - boiled egg or nut butter and to lunch - peanut butter, choose low sodium or no salt added canned food items LT: lose weight (she would like to lose her stomach), eat 8 fruits/vegetables per day, eat a rainbow of fruits and vegetables every week, meet protein needs    Comments She reports eating smaller portions. Coffee: 1 sweet and low and some creamer. B: cereal or bagel with cream cheese or bacon and eggs with grits or frozen waffles Snack: piece of fruit or nabs with soft drink (regular) D: meat with two vegetables. Meat: ground beef  to make meatloaf, chicken, seafood, pork. Red meat 2x/week). She reports liking all vegetables. Potatoes and corn. She likes peas, black eyed peas, navy beans, turnip greens. She will typically have nonstarchy and starchy vegetables. She will use some olive oil and not much grease. She will usually bake or airfry. S: ice cream sandwich, recently have been getting frozen real fruit popsicles. Discussed heart healthy eating and pulmonary MNT.      Intervention Plan   Intervention Prescribe, educate and counsel regarding individualized specific dietary modifications aiming towards targeted core components such as weight, hypertension, lipid management, diabetes, heart failure and other comorbidities.;Nutrition handout(s) given to patient.    Expected Outcomes Short Term Goal: Understand basic principles of dietary content, such as calories, fat, sodium, cholesterol and nutrients.;Short Term Goal: A plan has been developed with personal nutrition goals set during dietitian appointment.;Long Term Goal: Adherence to prescribed nutrition plan.           Nutrition  Assessments:  Nutrition Assessments - 08/16/20 1619      MEDFICTS Scores   Pre Score 35          MEDIFICTS Score Key:  ?70 Need to make dietary changes  40-70 Heart Healthy Diet  ? 40 Therapeutic Level Cholesterol Diet   Picture Your Plate Scores:  <81<40 Unhealthy dietary pattern with much room for improvement.  41-50 Dietary pattern unlikely to meet recommendations for good health and room for improvement.  51-60 More healthful dietary pattern, with some room for improvement.   >60 Healthy dietary pattern, although there may be some specific behaviors that could be improved.   Nutrition Goals Re-Evaluation:  Nutrition Goals Re-Evaluation    Row Name 09/09/20 1336 10/13/20 1348           Goals   Nutrition Goal set up meeting with Melissa ST: Add protein to breakfast - boiled egg or nut butter and to lunch - peanut butter, choose low sodium or no salt added canned food items LT: lose weight (she would like to lose her stomach), eat 8 fruits/vegetables per day, eat a rainbow of fruits and vegetables every week, meet protein needs      Comment Heidi [name] will schedule an appointment with Melissa for her diet. Karema reports draining canned foods, but not rinsing all of them. Suggested getting low sodium canned items and rinsing them off. She reports not eating much protein still and has sugary yogurt. Suggested eggs, peanut butter, greek yogurt as additions to breakfast.      Expected Outcome Meet with dietitian ST: Add protein to breakfast - boiled egg or nut butter and to lunch - peanut butter, choose low sodium or no salt added canned food items LT: lose weight (she would like to lose her stomach), eat 8 fruits/vegetables per day, eat a rainbow of fruits and vegetables every week, meet protein needs             Nutrition Goals Discharge (Final Nutrition Goals Re-Evaluation):  Nutrition Goals Re-Evaluation - 10/13/20 1348      Goals   Nutrition Goal ST: Add protein to  breakfast - boiled egg or nut butter and to lunch - peanut butter, choose low sodium or no salt added canned food items LT: lose weight (she would like to lose her stomach), eat 8 fruits/vegetables per day, eat a rainbow of fruits and vegetables every week, meet protein needs    Comment Heidi [name] reports draining canned foods, but not rinsing all of them. Suggested getting low sodium canned items and rinsing them off. She reports not eating much protein still and has sugary yogurt. Suggested eggs, peanut butter, greek yogurt as additions to breakfast.    Expected Outcome ST: Add protein to breakfast - boiled egg or nut butter and to lunch - peanut butter, choose low sodium or no salt added canned food items LT: lose weight (she would like to lose her stomach), eat 8 fruits/vegetables per day, eat a rainbow of fruits and vegetables every week, meet protein needs           Psychosocial: Target Goals: Acknowledge presence or absence of significant depression and/or stress, maximize coping skills, provide positive support system. Participant is able to verbalize types and ability to use techniques and skills needed for reducing stress and depression.   Education: Stress, Anxiety, and Depression - Group verbal and visual presentation to define topics covered.  Reviews how body is impacted by stress, anxiety, and depression.  Also discusses healthy ways to reduce stress and to treat/manage anxiety and depression.  Written material given at graduation. Flowsheet Row Pulmonary Rehab from 05/29/2016 in [facility] Cardiac and Pulmonary Rehab  Date 04/12/16  Educator Heidi [name], [facility]  Instruction Review Code (  retired) 2- meets goals/outcomes      Education: [employer] -Provides group verbal and written instruction about how sleep can affect your health.  Define sleep hygiene, discuss sleep cycles and impact of sleep habits. Review good sleep hygiene tips.    Initial Review & Psychosocial Screening:   Initial Psych Review & Screening - 08/11/20 1114      Initial Review   Current issues with Current Depression;Current Psychotropic Meds;Current Stress Concerns    Source of Stress Concerns Unable to participate in former interests or hobbies;Unable to perform yard/household activities;Chronic Illness    Comments Chronic illness causing not able to do things she would like to do.      Family Dynamics   Good Support System? Yes   Partner 7 years together in home, daughter not far about 10 min down the road.     Barriers   Psychosocial barriers to participate in program There are no identifiable barriers or psychosocial needs.;The patient should benefit from training in stress management and relaxation.      Screening Interventions   Interventions Encouraged to exercise;Provide feedback about the scores to participant;To provide support and resources with identified psychosocial needs    Expected Outcomes Short Term goal: Utilizing psychosocial counselor, staff and physician to assist with identification of specific Stressors or current issues interfering with healing process. Setting desired goal for each stressor or current issue identified.;Long Term Goal: Stressors or current issues are controlled or eliminated.;Short Term goal: Identification and review with participant of any Quality of Life or Depression concerns found by scoring the questionnaire.;Long Term goal: The participant improves quality of Life and PHQ9 Scores as seen by post scores and/or verbalization of changes           Quality of Life Scores:  Scores of 19 and below usually indicate a poorer quality of life in these areas.  A difference of  2-3 points is a clinically meaningful difference.  A difference of 2-3 points in the total score of the Quality of Life Index has been associated with significant improvement in overall quality of life, self-image, physical symptoms, and general health in studies assessing change in  quality of life.  PHQ-9: Recent Review Flowsheet Data    Depression screen [facility] 2/9 08/16/2020 05/26/2016 02/15/2016   Decreased Interest 1 0 0   Down, Depressed, Hopeless 1 1 0   PHQ - 2 Score 2 1 0   Altered sleeping 1 3 1    Tired, decreased energy 1 1 1    Change in appetite 0 0 1   Feeling bad or failure about yourself  0 0 0   Trouble concentrating 0 0 0   Moving slowly or fidgety/restless 0 0 0   Suicidal thoughts 0 0 0   PHQ-9 Score 4 5 3    Difficult doing work/chores Somewhat difficult Somewhat difficult Not difficult at all     Interpretation of Total Score  Total Score Depression Severity:  1-4 = Minimal depression, 5-9 = Mild depression, 10-14 = Moderate depression, 15-19 = Moderately severe depression, 20-27 = Severe depression   Psychosocial Evaluation and Intervention:  Psychosocial Evaluation - 08/11/20 1127      Psychosocial Evaluation & Interventions   Comments Maanvi has no barriers to entering the program. She has attended PR in the past and remembers how it helped her have more energy to manage her daily ADLs and chores.  She does hae stress over the chronic illness and her inablility to manage her daily activities  and activities she would like to participate in daily. She is looking forward to exercising and feeling better.  She lives at home with her partner of 7 years. Her daughter lives very close by and is part of her support system. Her goal is to be able to move around more than she is right now. She is ready to start and work towards that goal.    Expected Outcomes STG: Heidi [name] is able to attend all scheduled sessions.  LTG: Heidi [name] benefits from the prgram in being able to manage more daily activiyies and decreasing her stress of not having energy to do what she likes.    Continue Psychosocial [name]  Follow up required by staff           Psychosocial Re-Evaluation:  Psychosocial Re-Evaluation    Row Name 09/09/20 1336 10/13/20 1403            Psychosocial Re-Evaluation   Current issues with Current Stress Concerns;Current Anxiety/Panic Current Anxiety/Panic;Current Stress Concerns      Comments Heidi [name] is just getting started in rehab. She noticed her anxiety was really high her first day, but can already tell a difference in it calming down.  She knows that exercise is going to help her manage it better.  She still has a hard time sleeping at night.  She usually averages abour 4-5 hours a night.  She is not sure what keeps waking her at night. She has had a sleep study before.  She does have a fan that helps her sleep too. She reports having some stress concerns - she reports having stress regarding her partner (arguments - "he is never wrong"). She has a referral for a psychiatrist in regards to her anxiety and panic. She has anxiety going into stores and the gym to exercise. She is sleeping better 6 hours a night now - anxiety was waking her up at night previously.      Expected Outcomes Short: Continue to exercise to help with anxiety  Long: Continue to work on sleep Short: Continue to exercise to help with anxiety, see psychiatrist  Long: Continue to work on sleep and anxiety management      Interventions Encouraged to attend Pulmonary Rehabilitation for the exercise;Stress management education Encouraged to attend Pulmonary Rehabilitation for the exercise;Stress management education      Continue Psychosocial [name]  Follow up required by staff --      Comments Chronic illness causing not able to do things she would like to do. She reports feeling better about her chronic illness.             Initial Review   Source of Stress Concerns Unable to participate in former interests or hobbies;Unable to perform yard/household activities;Chronic Illness Family             Psychosocial Discharge (Final Psychosocial Re-Evaluation):  Psychosocial Re-Evaluation - 10/13/20 1403      Psychosocial Re-Evaluation   Current issues with Current  Anxiety/Panic;Current Stress Concerns    Comments She reports having some stress concerns - she reports having stress regarding her partner (arguments - "he is never wrong"). She has a referral for a psychiatrist in regards to her anxiety and panic. She has anxiety going into stores and the gym to exercise. She is sleeping better 6 hours a night now - anxiety was waking her up at night previously.    Expected Outcomes Short: Continue to exercise to help with anxiety, see psychiatrist  Long: Continue to work on  sleep and anxiety management    Interventions Encouraged to attend Pulmonary Rehabilitation for the exercise;Stress management education    Comments She reports feeling better about her chronic illness.      Initial Review   Source of Stress Concerns Family           Education: Education Goals: Education classes will be provided on a weekly basis, covering required topics. Participant will state understanding/return demonstration of topics presented.  Learning Barriers/Preferences:   General Pulmonary Education Topics:  Infection Prevention: - Provides verbal and written material to individual with discussion of infection control including proper hand washing and proper equipment cleaning during exercise session. Flowsheet Row Pulmonary Rehab from 10/13/2020 in [facility] Cardiac and Pulmonary Rehab  Date 08/16/20  Educator [facility]  Instruction Review Code 1- Verbalizes Understanding      Falls Prevention: - Provides verbal and written material to individual with discussion of falls prevention and safety. Flowsheet Row Pulmonary Rehab from 10/13/2020 in [facility] Cardiac and Pulmonary Rehab  Date 08/16/20  Educator As  Instruction Review Code 1- Verbalizes Understanding      Chronic Lung Disease Review: - Group verbal instruction with posters, models, PowerPoint presentations and videos,  to review new updates, new respiratory medications, new advancements in procedures and treatments.  Providing information on websites and "800" numbers for continued self-education. Includes information about supplement oxygen, available portable oxygen systems, continuous and intermittent flow rates, oxygen safety, concentrators, and Medicare reimbursement for oxygen. Explanation of Pulmonary Drugs, including class, frequency, complications, importance of spacers, rinsing mouth after steroid MDI's, and proper cleaning methods for nebulizers. Review of basic lung anatomy and physiology related to function, structure, and complications of lung disease. Review of risk factors. Discussion about methods for diagnosing sleep apnea and types of masks and machines for OSA. Includes a review of the use of types of environmental controls: home humidity, furnaces, filters, dust mite/pet prevention, HEPA vacuums. Discussion about weather changes, air quality and the benefits of nasal washing. Instruction on Warning signs, infection symptoms, calling MD promptly, preventive modes, and value of vaccinations. Review of effective airway clearance, coughing and/or vibration techniques. Emphasizing that all should Create an Action Plan. Written material given at graduation. Flowsheet Row Pulmonary Rehab from 10/13/2020 in [facility] Cardiac and Pulmonary Rehab  Date 09/29/20  Educator Heidi [name]  Instruction Review Code 1- Verbalizes Understanding      AED/CPR: - Group verbal and written instruction with the use of models to demonstrate the basic use of the AED with the basic ABC's of resuscitation.    Anatomy and Cardiac Procedures: - Group verbal and visual presentation and models provide information about basic cardiac anatomy and function. Reviews the testing methods done to diagnose heart disease and the outcomes of the test results. Describes the treatment choices: Medical Management, Angioplasty, or Coronary Bypass Surgery for treating various heart conditions including Myocardial Infarction, Angina, Valve Disease, and  Cardiac Arrhythmias.  Written material given at graduation.   Medication Safety: - Group verbal and visual instruction to review commonly prescribed medications for heart and lung disease. Reviews the medication, class of the drug, and side effects. Includes the steps to properly store meds and maintain the prescription regimen.  Written material given at graduation. Flowsheet Row Pulmonary Rehab from 10/13/2020 in [facility] Cardiac and Pulmonary Rehab  Date 09/15/20  Educator [facility]  Instruction Review Code 1- Verbalizes Understanding      Other: -Provides group and verbal instruction on various topics (see comments)   Knowledge Questionnaire Score:  Knowledge Questionnaire Score - 08/16/20 1619      Knowledge Questionnaire Score   Pre Score 16/18 oxygen            Core Components/Risk Factors/Patient Goals at Admission:  Personal Goals and Risk Factors at Admission - 08/16/20 1621      Core Components/Risk Factors/Patient Goals on Admission    Weight Management Yes    Intervention Weight Management: Develop a combined nutrition and exercise program designed to reach desired caloric intake, while maintaining appropriate intake of nutrient and fiber, sodium and fats, and appropriate energy expenditure required for the weight goal.;Weight Management/Obesity: Establish reasonable short term and long term weight goals.;Weight Management: Provide education and appropriate resources to help participant work on and attain dietary goals.;Obesity: Provide education and appropriate resources to help participant work on and attain dietary goals.    Admit Weight 149 lb 4.8 oz (67.7 kg)    Goal Weight: Short Term 145 lb (65.8 kg)    Goal Weight: Long Term 130 lb (59 kg)    Expected Outcomes Short Term: Continue to assess and modify interventions until short term weight is achieved;Long Term: Adherence to nutrition and physical activity/exercise program aimed toward attainment of established weight  goal;Weight Loss: Understanding of general recommendations for a balanced deficit meal plan, which promotes 1-2 lb weight loss per week and includes a negative energy balance of [phone] kcal/d    Improve shortness of breath with ADL's Yes           Education:Diabetes - Individual verbal and written instruction to review signs/symptoms of diabetes, desired ranges of glucose level fasting, after meals and with exercise. Acknowledge that pre and post exercise glucose checks will be done for 3 sessions at entry of program.   Know Your Numbers and Heart Failure: - Group verbal and visual instruction to discuss disease risk factors for cardiac and pulmonary disease and treatment options.  Reviews associated critical values for Overweight/Obesity, Hypertension, Cholesterol, and Diabetes.  Discusses basics of heart failure: signs/symptoms and treatments.  Introduces Heart Failure Zone chart for action plan for heart failure.  Written material given at graduation.   Core Components/Risk Factors/Patient Goals Review:   Goals and Risk Factor Review    Row Name 09/09/20 1339 10/13/20 1412           Core Components/Risk Factors/Patient Goals Review   Personal Goals Review Weight Management/Obesity;Improve shortness of breath with ADL's Weight Management/Obesity;Improve shortness of breath with ADL's      Review Heidi [name] is off to a good start in rehab.  Her weight has been holding steady. She is trying to work on her breathing and can already tell exercise has helped with her anxiety. She will continue to use her PLB. Heidi [name] reports her weight has been stable. She continues to work on her breathing and feels it is getting better with her ADLs. she continues to exercise and practice PLB.      Expected Outcomes Short: Continue to work on weight loss Long; Continue to improve breathing. Short: Continue to come to rehab and workout on her own Long; Continue to improve breathing.             Core  Components/Risk Factors/Patient Goals at Discharge (Final Review):   Goals and Risk Factor Review - 10/13/20 1412      Core Components/Risk Factors/Patient Goals Review   Personal Goals Review Weight Management/Obesity;Improve shortness of breath with ADL's    Review Heidi [name] reports her weight has been stable. She continues  to work on her breathing and feels it is getting better with her ADLs. she continues to exercise and practice PLB.    Expected Outcomes Short: Continue to come to rehab and workout on her own Long; Continue to improve breathing.           ITP Comments:  ITP Comments    Row Name 08/11/20 1134 08/16/20 1624 09/01/20 0616 09/06/20 1406 09/29/20 0915   ITP Comments Virtual orientation call completed today. shehas an appointment on Date: 08/16/2020 for EP eval and gym Orientation.  Documentation of diagnosis can be found in [facility]  Date: [id number]. Completed and gym orientation. Initial ITP created and sent for review to Dr. Bethann Punches, Medical Director. 30 Day review completed. Medical Director ITP review done, changes made as directed, and signed approval by Medical Director. First full day of exercise!  Patient was oriented to gym and equipment including functions, settings, policies, and procedures.  Patient's individual exercise prescription and treatment plan were reviewed.  All starting workloads were established based on the results of the 6 minute walk test done at initial orientation visit.  The plan for exercise progression was also introduced and progression will be customized based on patient's performance and goals. 30 Day review completed. Medical Director ITP review done, changes made as directed, and signed approval by Medical Director.   Row Name 10/27/20 0532           ITP Comments 30 Day review completed. Medical Director ITP review done, changes made as directed, and signed approval by Medical Director.   only 3 visits since last review               Comments:

## 2020-11-03 ENCOUNTER — Other Ambulatory Visit: Payer: Self-pay

## 2020-11-03 ENCOUNTER — Emergency Department: Payer: Medicare Other

## 2020-11-03 DIAGNOSIS — Z9981 Dependence on supplemental oxygen: Secondary | ICD-10-CM | POA: Insufficient documentation

## 2020-11-03 DIAGNOSIS — Z20822 Contact with and (suspected) exposure to covid-19: Secondary | ICD-10-CM | POA: Diagnosis not present

## 2020-11-03 DIAGNOSIS — J441 Chronic obstructive pulmonary disease with (acute) exacerbation: Secondary | ICD-10-CM | POA: Diagnosis not present

## 2020-11-03 DIAGNOSIS — R0602 Shortness of breath: Secondary | ICD-10-CM | POA: Diagnosis present

## 2020-11-03 DIAGNOSIS — Z87891 Personal history of nicotine dependence: Secondary | ICD-10-CM | POA: Insufficient documentation

## 2020-11-03 LAB — CBC
HCT: 39.6 % (ref 36.0–46.0)
Hemoglobin: 13 g/dL (ref 12.0–15.0)
MCH: 31 pg (ref 26.0–34.0)
MCHC: 32.8 g/dL (ref 30.0–36.0)
MCV: 94.5 fL (ref 80.0–100.0)
Platelets: 340 10*3/uL (ref 150–400)
RBC: 4.19 MIL/uL (ref 3.87–5.11)
RDW: 13 % (ref 11.5–15.5)
WBC: 8.4 10*3/uL (ref 4.0–10.5)
nRBC: 0 % (ref 0.0–0.2)

## 2020-11-03 LAB — BASIC METABOLIC PANEL
Anion gap: 11 (ref 5–15)
BUN: 10 mg/dL (ref 8–23)
CO2: 33 mmol/L — ABNORMAL HIGH (ref 22–32)
Calcium: 9.2 mg/dL (ref 8.9–10.3)
Chloride: 98 mmol/L (ref 98–111)
Creatinine, Ser: 0.73 mg/dL (ref 0.44–1.00)
GFR, Estimated: >60 mL/min (ref >60)
Glucose, Bld: 181 mg/dL — ABNORMAL HIGH (ref 70–99)
Potassium: 4.1 mmol/L (ref 3.5–5.1)
Sodium: 142 mmol/L (ref 135–145)

## 2020-11-03 NOTE — ED Triage Notes (Addendum)
Pt comes ems from home with sob, congestion. Pt has had a cough since Friday. Pt on O2 from EMS 3L [place] (same as home O2). Pt has to take breaks between sentences. AOx4.

## 2020-11-03 NOTE — ED Notes (Signed)
First RN note:  Pt comes into the ED via [facility] from home c/o [facility].  Pt has had it for 5 days intermittently with a cough and sore throat.  Pt's COVID test is pending at this time. H/o emphysema, wheezing bilaterally.  Pt presents tachy at 110.  Pt had nebulizer prior to EMS arrival.  125 Solumedrol given.  18 g L wrist. 3 L chronically and saturation level at 95%.

## 2020-11-04 ENCOUNTER — Observation Stay
Admission: EM | Admit: 2020-11-04 | Discharge: 2020-11-05 | Disposition: A | Discharge status: Home / Self Care | Payer: Medicare Other | Attending: Family Medicine | Admitting: Family Medicine

## 2020-11-04 DIAGNOSIS — J441 Chronic obstructive pulmonary disease with (acute) exacerbation: Principal | ICD-10-CM

## 2020-11-04 LAB — POC SARS CORONAVIRUS 2 AG -  ED: SARS Coronavirus 2 Ag: NEGATIVE

## 2020-11-04 LAB — CBC
HCT: 39.1 % (ref 36.0–46.0)
Hemoglobin: 12.6 g/dL (ref 12.0–15.0)
MCH: 30.7 pg (ref 26.0–34.0)
MCHC: 32.2 g/dL (ref 30.0–36.0)
MCV: 95.4 fL (ref 80.0–100.0)
Platelets: 376 10*3/uL (ref 150–400)
RBC: 4.1 MIL/uL (ref 3.87–5.11)
RDW: 13.1 % (ref 11.5–15.5)
WBC: 10.3 10*3/uL (ref 4.0–10.5)
nRBC: 0 % (ref 0.0–0.2)

## 2020-11-04 LAB — RESP PANEL BY RT-PCR (FLU A&B, COVID) ARPGX2
Influenza A by PCR: NEGATIVE
Influenza B by PCR: NEGATIVE
SARS Coronavirus 2 by RT PCR: NEGATIVE

## 2020-11-04 LAB — CREATININE, SERUM
Creatinine, Ser: 0.74 mg/dL (ref 0.44–1.00)
GFR, Estimated: >60 mL/min (ref >60)

## 2020-11-04 MED ORDER — MONTELUKAST SODIUM 10 MG PO TABS
10.0000 mg | ORAL_TABLET | Freq: Every day | ORAL | Status: DC
  Administered 2020-11-04: 10 mg via ORAL
  Filled 2020-11-04: qty 1

## 2020-11-04 MED ORDER — IPRATROPIUM BROMIDE 0.02 % IN SOLN
0.5000 mg | Freq: Once | RESPIRATORY_TRACT | Status: AC
  Administered 2020-11-04: 0.5 mg via RESPIRATORY_TRACT
  Filled 2020-11-04: qty 2.5

## 2020-11-04 MED ORDER — IPRATROPIUM-ALBUTEROL 0.5-2.5 (3) MG/3ML IN SOLN
3.0000 mL | Freq: Once | RESPIRATORY_TRACT | Status: AC
  Administered 2020-11-04: 3 mL via RESPIRATORY_TRACT
  Filled 2020-11-04: qty 3

## 2020-11-04 MED ORDER — ALPRAZOLAM 0.25 MG PO TABS
0.2500 mg | ORAL_TABLET | Freq: Two times a day (BID) | ORAL | Status: DC
  Administered 2020-11-04 – 2020-11-05 (×3): 0.25 mg via ORAL
  Filled 2020-11-04 (×3): qty 1

## 2020-11-04 MED ORDER — SODIUM CHLORIDE 0.9% FLUSH
3.0000 mL | Freq: Two times a day (BID) | INTRAVENOUS | Status: DC
  Administered 2020-11-04 (×2): 3 mL via INTRAVENOUS

## 2020-11-04 MED ORDER — IPRATROPIUM-ALBUTEROL 0.5-2.5 (3) MG/3ML IN SOLN
3.0000 mL | Freq: Four times a day (QID) | RESPIRATORY_TRACT | Status: DC
  Administered 2020-11-04 – 2020-11-05 (×5): 3 mL via RESPIRATORY_TRACT
  Filled 2020-11-04 (×5): qty 3

## 2020-11-04 MED ORDER — ENOXAPARIN SODIUM 40 MG/0.4ML ~~LOC~~ SOLN
40.0000 mg | SUBCUTANEOUS | Status: DC
  Administered 2020-11-04: 40 mg via SUBCUTANEOUS
  Filled 2020-11-04: qty 0.4

## 2020-11-04 MED ORDER — SODIUM CHLORIDE 0.9% FLUSH: 3.0000 mL | INTRAVENOUS | Status: DC | PRN

## 2020-11-04 MED ORDER — SODIUM CHLORIDE 0.9 % IV SOLN: 250.0000 mL | INTRAVENOUS | Status: DC | PRN

## 2020-11-04 MED ORDER — FLUTICASONE-UMECLIDIN-VILANT 100-62.5-25 MCG/INH IN AEPB: 1.0000 | INHALATION_SPRAY | Freq: Every day | RESPIRATORY_TRACT | Status: DC

## 2020-11-04 MED ORDER — ALBUTEROL SULFATE (2.5 MG/3ML) 0.083% IN NEBU
5.0000 mg | INHALATION_SOLUTION | Freq: Once | RESPIRATORY_TRACT | Status: AC
  Administered 2020-11-04: 5 mg via RESPIRATORY_TRACT
  Filled 2020-11-04: qty 6

## 2020-11-04 MED ORDER — PREDNISONE 20 MG PO TABS
40.0000 mg | ORAL_TABLET | Freq: Every day | ORAL | Status: DC
  Administered 2020-11-05: 40 mg via ORAL
  Filled 2020-11-04: qty 2

## 2020-11-04 MED ORDER — AZITHROMYCIN 500 MG PO TABS
500.0000 mg | ORAL_TABLET | Freq: Every day | ORAL | Status: AC
  Administered 2020-11-04: 500 mg via ORAL
  Filled 2020-11-04: qty 1

## 2020-11-04 MED ORDER — AZITHROMYCIN 250 MG PO TABS
250.0000 mg | ORAL_TABLET | Freq: Every day | ORAL | Status: DC
  Administered 2020-11-05: 250 mg via ORAL
  Filled 2020-11-04: qty 1

## 2020-11-04 MED ORDER — GUAIFENESIN-DM 100-10 MG/5ML PO SYRP
5.0000 mL | ORAL_SOLUTION | ORAL | Status: DC | PRN
  Administered 2020-11-04 – 2020-11-05 (×2): 5 mL via ORAL
  Filled 2020-11-04 (×4): qty 5

## 2020-11-04 MED ORDER — FLUTICASONE FUROATE-VILANTEROL 100-25 MCG/INH IN AEPB
1.0000 | INHALATION_SPRAY | Freq: Every day | RESPIRATORY_TRACT | Status: DC
  Administered 2020-11-04 – 2020-11-05 (×2): 1 via RESPIRATORY_TRACT
  Filled 2020-11-04: qty 28

## 2020-11-04 MED ORDER — METHYLPREDNISOLONE SODIUM SUCC 125 MG IJ SOLR
125.0000 mg | Freq: Once | INTRAMUSCULAR | Status: AC
  Administered 2020-11-04: 125 mg via INTRAVENOUS
  Filled 2020-11-04: qty 2

## 2020-11-04 MED ORDER — ESCITALOPRAM OXALATE 10 MG PO TABS
20.0000 mg | ORAL_TABLET | Freq: Every day | ORAL | Status: DC
  Administered 2020-11-04 – 2020-11-05 (×2): 20 mg via ORAL
  Filled 2020-11-04 (×2): qty 2

## 2020-11-04 MED ORDER — UMECLIDINIUM BROMIDE 62.5 MCG/INH IN AEPB
1.0000 | INHALATION_SPRAY | Freq: Every day | RESPIRATORY_TRACT | Status: DC
  Administered 2020-11-04 – 2020-11-05 (×2): 1 via RESPIRATORY_TRACT
  Filled 2020-11-04: qty 7

## 2020-11-04 MED ORDER — MIRTAZAPINE 30 MG PO TABS
30.0000 mg | ORAL_TABLET | Freq: Every day | ORAL | Status: DC
  Administered 2020-11-04: 30 mg via ORAL
  Filled 2020-11-04: qty 1

## 2020-11-04 MED ORDER — ALBUTEROL SULFATE (2.5 MG/3ML) 0.083% IN NEBU: 2.5000 mg | INHALATION_SOLUTION | RESPIRATORY_TRACT | Status: DC | PRN

## 2020-11-04 NOTE — Care Management CC44 (Signed)
Condition Code 44 Documentation Completed  Patient Details  Name: Heidi [name] MRN: [id number] Date of Birth: December 14, 1955   Condition Code 44 given:  Yes Patient signature on Condition Code 44 notice:  Yes Documentation of 2 MD's agreement:  Yes Code 44 added to claim:  Yes    Forest Cellar, RN 11/04/2020, 2:53 PM

## 2020-11-04 NOTE — Progress Notes (Signed)
Medications given per MAR, breakfast tray given. Patient resting in NAD at this time.

## 2020-11-04 NOTE — ED Provider Notes (Signed)
[facility] Emergency Department Provider Note   ____________________________________________   Event Date/Time   First MD Initiated Contact with Patient 11/04/20 [phone]     (approximate)  I have reviewed the triage vital signs and the nursing notes.   HISTORY  Chief Complaint Shortness of Breath    HPI Heidi [name] is a 65 y.o. female with history of COPD on failure of nasal cannula who presents to the emergency department with sore throat that started Friday  10/29/2020.  The next day she states that her sinuses felt "dry".  She started feeling more short of breath.  The next day she went to have a Covid test done and these results are still pending.  She has had 3 COVID-19 vaccinations.  She denies any known fevers.  She has had productive cough.  No chest discomfort.  No vomiting or diarrhea.  Did see her pulmonologist on Wednesday, 10/27/2020 for routine checkup.  Has been on prednisone and azithromycin for 3 days.    Past Medical History:  Diagnosis Date  . Anxiety   . COPD (chronic obstructive pulmonary disease) (HCC)   . GERD (gastroesophageal reflux disease)   . Hypotension    limiting med titration  . NSTEMI (non-ST elevated myocardial infarction) [facility]) 2013   12/2011 with normal coronaries possibly secondary to Takotsubo (EF 30-35% by echo), occurred following two episodes of acute respiratory distress  . Pneumonia 11/2016   history of  . 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 GAD (generalized anxiety disorder) 06/02/2017  . Symptomatic cholelithiasis 06/02/2017  . Elevated alkaline phosphatase level 04/09/2017  . Sepsis due to pneumonia (HCC) 12/02/2016  . Fine tremor 02/17/2016  . COPD (chronic obstructive pulmonary disease) (HCC) 01/13/2016  . Near syncope 12/01/2015  . Aortic atherosclerosis (HCC) 12/01/2015  . CAP (community acquired pneumonia) 01/05/2015  . Ex-smoker 07/14/2014  .  Allergic rhinitis 03/03/2014  . Anxiety 04/10/2013  . Hoarseness 10/10/2012  . Dysphonia 10/10/2012  . Acute respiratory failure (HCC) 08/08/2012  . Acute encephalopathy 08/08/2012  . COPD with acute exacerbation (HCC) 08/08/2012  . Lactic acidosis 08/08/2012  . Chronic respiratory failure (HCC) 03/27/2012  . Takotsubo cardiomyopathy 01/31/2012  . Chest pain 01/23/2012  . TINGLING 05/26/2010  . SKIN LESION 11/08/2009  . ASCUS PAP 08/02/2009  . VAG HIGH RISK HUMAN PAPILLOMAVIRUS DNA TEST POS 08/02/2009  . DEPRESSION, CHRONIC with anxiety 06/22/2009  . MICROSCOPIC HEMATURIA 06/22/2009  . Smoking history 04/06/2009  . BRUISE 04/06/2009  . URI 10/09/2007  . HYSTERECTOMY, PARTIAL, HX OF 07/01/2007  . COPD exacerbation (HCC) 06/18/2007  . OSTEOPENIA 06/18/2007  . 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 ABDOMINAL HYSTERECTOMY    . CARDIAC CATHETERIZATION    . CHOLECYSTECTOMY N/A 06/05/2017   Procedure: LAPAROSCOPIC CHOLECYSTECTOMY;  Surgeon: Nadeen Landau, MD;  Location: [facility] ORS;  Service: General;  Laterality: N/A;  . FRACTURE SURGERY Left 07/2016   leg compound fraction  . LEFT HEART CATHETERIZATION WITH CORONARY ANGIOGRAM N/A 01/23/2012   Procedure: LEFT HEART CATHETERIZATION WITH CORONARY ANGIOGRAM;  Surgeon: Dolores Patty, MD;  Location: [facility] CATH LAB;  Service: Cardiovascular;  Laterality: N/A;  . vagina rectal repair     after childbirth    Prior to Admission medications   Medication Sig Start Date End Date Taking? Authorizing Provider  albuterol (PROVENTIL HFA;VENTOLIN HFA) 108 (90  Base) MCG/ACT inhaler Inhale 1-2 puffs into the lungs every 4 (four) hours as needed for wheezing or shortness of breath.    [provider]  albuterol (PROVENTIL) (2.5 MG/3ML) 0.083% nebulizer solution Take 3 mLs (2.5 mg total) by nebulization every 6 (six) hours as needed for  wheezing or shortness of breath. 07/10/19   Parrett, Fonnie Mu, NP  ALPRAZolam (XANAX) 0.25 MG tablet Take 0.25 mg by mouth 2 (two) times daily.     [provider]  azithromycin (ZITHROMAX) 250 MG tablet Take 500 mg daily for 2 days more Patient not taking: Reported on 08/11/2020 07/12/20   Wyvonnia Dusky, MD  escitalopram (LEXAPRO) 20 MG tablet Take 20 mg by mouth daily.    [provider]  fluticasone (FLONASE) 50 MCG/ACT nasal spray USE 2 SPRAYS IN EACH NOSTRIL ONCE DAILY 05/01/18   Juanito Doom, MD  Fluticasone-Umeclidin-Vilant (TRELEGY ELLIPTA) 100-62.5-25 MCG/INH AEPB Inhale 1 puff into the lungs daily. Pt needs appt for further refills. 10/12/20   Parrett, Tammy S, NP  Ipratropium-Albuterol (COMBIVENT RESPIMAT) 20-100 MCG/ACT AERS respimat INHALE 1 PUFF BY MOUTH EVERY 6 HOURS AS NEEDED FOR WHEEZING 07/10/19   Parrett, Tammy S, NP  mirtazapine (REMERON) 30 MG tablet Take 30 mg by mouth at bedtime.    [provider]  montelukast (SINGULAIR) 10 MG tablet Take 10 mg by mouth at bedtime.    [provider]    Allergies Prozac [fluoxetine], Budesonide, Buspar [buspirone], Levaquin [levofloxacin in d5w], Propranolol, and Zoloft [sertraline hcl]  Family History  Problem Relation Age of Onset  . Heart attack Mother        deceased from massive MI at age 69  . Liver disease Father        fatty liver; living, age 29  . Stroke Brother        at age 15, living   . Hypertension Brother        living, age 68    Social History Social History   Tobacco Use  . Smoking status: Former Smoker    Packs/day: 1.00    Years: 30.00    Pack years: 30.00    Types: Cigarettes    Quit date: 01/21/2012    Years since quitting: 8.7  . Smokeless tobacco: Never Used  Vaping Use  . Vaping Use: Never used  Substance Use Topics  . Alcohol use: Yes    Comment: 1-2 beers per week  . Drug use: No    Review of Systems  Constitutional: No fever/chills Eyes: No  visual changes. ENT: + sore throat. Cardiovascular: Denies chest pain. Respiratory: + shortness of breath. Gastrointestinal: No abdominal pain.  No nausea, no vomiting.  No diarrhea.  No constipation. Genitourinary: Negative for dysuria. Musculoskeletal: Negative for back pain. Skin: Negative for rash. Neurological: Negative for headaches, focal weakness or numbness.   ____________________________________________   PHYSICAL EXAM:  VITAL SIGNS: ED Triage Vitals [11/03/20 1737]  Enc Vitals Group     BP (!) 161/107     Pulse Rate (!) 105     Resp (!) 25     Temp 98.5 F (36.9 C)     Temp Source Oral     SpO2 97 %     Weight 147 lb 11.3 oz (67 kg)     Height 5\' 5"  (1.651 m)     Head Circumference      Peak Flow      Pain Score 0     Pain Loc  Pain Edu?      Excl. in GC?     Constitutional: Alert and oriented. Well appearing and in no acute distress. Eyes: Conjunctivae are normal. PERRL. EOMI. Head: Atraumatic. Nose: No congestion/rhinnorhea. Mouth/Throat: Mucous membranes are moist.  Oropharynx non-erythematous. Neck: No stridor.   Cardiovascular: Normal rate, regular rhythm. Grossly normal heart sounds.  Good peripheral circulation. Respiratory: Normal respiratory effort.  No retractions.  Diminished aeration diffusely.  No rhonchi or wheezing.  No rales.  Speaking full sentences.  No hypoxia on her normal 3 L nasal cannula.  Intermittently tachypneic after episode of coughing. Gastrointestinal: Soft and nontender. No distention. No abdominal bruits. No CVA tenderness. Musculoskeletal: No lower extremity tenderness nor edema.  No joint effusions. Neurologic:  Normal speech and language. No gross focal neurologic deficits are appreciated. No gait instability. Skin:  Skin is warm, dry and intact. No rash noted. Psychiatric: Mood and affect are normal. Speech and behavior are normal.  ____________________________________________   LABS (all labs ordered are listed,  but only abnormal results are displayed)  Labs Reviewed  BASIC METABOLIC PANEL - Abnormal; Notable for the following components:      Result Value   CO2 33 (*)    Glucose, Bld 181 (*)    All other components within normal limits  CBC  POC SARS CORONAVIRUS 2 AG -  ED   ____________________________________________  EKG  None ____________________________________________  RADIOLOGY  ED MD interpretation: Chest x-ray shows no acute abnormality.  Official radiology report(s): DG Chest 2 View  Result Date: 11/03/2020 CLINICAL DATA:  Shortness of breath EXAM: CHEST - 2 VIEW COMPARISON:  07/09/2020, 12/17/2017 FINDINGS: Hyperinflation with emphysematous disease and bullous change on the right. No acute consolidation or effusion. Stable cardiomediastinal silhouette. No pneumothorax. Stable right apical scarring. IMPRESSION: No active cardiopulmonary disease. Emphysematous disease. Electronically Signed   By: Jasmine Pang M.D.   On: 11/03/2020 18:27    ____________________________________________   PROCEDURES  Procedure(s) performed: None  Procedures  Critical Care performed: No  ____________________________________________   INITIAL IMPRESSION / ASSESSMENT AND PLAN / ED COURSE  Patient here with sore throat, sinus pressure, cough and shortness of breath.  She has been vaccinated and boosted for COVID-19.  She denies any known sick contacts.  Labs obtained in triage are unremarkable.  Chest x-ray is clear.  Her rapid Covid antigen test here is negative.  She does have significantly diminished aeration on examination and sounds very tight.  Will give breathing treatments, Solu-Medrol.  She may require admission to the hospital for COPD exacerbation and given she is symptomatic, will obtain 2-hour flu and Covid test here.  She has no chest pain.  Doubt ACS, PE, dissection.  Will reassess after nebs.   5:57 AM  Pt has had some improvement in breath sounds after 2 albuterol and  Atrovent nebulizer treatments.  Is still wheezing and tight.  I feel she will need admission for a COPD exacerbation and she agrees.  Covid antigen test negative.   6:50 AM Discussed patient's case with hospitalist, Dr. Para March.  I have recommended admission and patient (and family if present) agree with this plan. Admitting physician will place admission orders.   Day hospitalist team will see patient for admission.  I reviewed all nursing notes, vitals, pertinent previous records and reviewed/interpreted all EKGs, lab and urine results, imaging (as available).   ____________________________________________   FINAL CLINICAL IMPRESSION(S) / ED DIAGNOSES  Final diagnoses:  COPD exacerbation [facility])     ED Discharge Orders  None       Note:  This document was prepared using Dragon voice recognition software and may include unintentional dictation errors.    Zygmund Passero, Layla Maw, DO 11/04/20 [phone]

## 2020-11-04 NOTE — H&P (Signed)
History and Physical    Heidi [name] [id number]:[id number] DOB: Oct 11, 1956 DOA: 11/04/2020  PCP: Patrice Paradise, MD  Patient coming from: home   Chief Complaint: dyspnea  HPI: Heidi [name] is a 65 y.o. female with medical history significant for severe copd, chronic home oxygen of 3 L, and takotsubo cardiomyopathy, who presents with the above.  Several days increased dyspnea and sputum production. Has not required more home O2 but significantly more short of breath with minimal activity. No chest pain or hemoptysis. No n/v/d. No fevers or chills. Is covid vaccinated/boosted. Followed by [facility] pulmonology, no recent med changes. Former smoker.  ED Course:   Duonebs, methylpred, chest x ray  Review of Systems: As per HPI otherwise 10 point review of systems negative.    Past Medical History:  Diagnosis Date  . Anxiety   . COPD (chronic obstructive pulmonary disease) (HCC)   . GERD (gastroesophageal reflux disease)   . Hypotension    limiting med titration  . NSTEMI (non-ST elevated myocardial infarction) [facility]) 2013   12/2011 with normal coronaries possibly secondary to Takotsubo (EF 30-35% by echo), occurred following two episodes of acute respiratory distress  . Pneumonia 11/2016   history of  . 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 ABDOMINAL HYSTERECTOMY    . CARDIAC CATHETERIZATION    . CHOLECYSTECTOMY N/A 06/05/2017   Procedure: LAPAROSCOPIC CHOLECYSTECTOMY;  Surgeon: Nadeen Landau, MD;  Location: [facility] ORS;  Service: General;  Laterality: N/A;  . FRACTURE SURGERY Left 07/2016   leg compound fraction  . LEFT HEART CATHETERIZATION WITH CORONARY ANGIOGRAM N/A 01/23/2012   Procedure: LEFT HEART CATHETERIZATION WITH CORONARY ANGIOGRAM;  Surgeon: Dolores Patty, MD;  Location: [facility] CATH LAB;  Service: Cardiovascular;  Laterality: N/A;  . vagina rectal repair     after childbirth     reports that she quit smoking  about 8 years ago. Her smoking use included cigarettes. She has a 30.00 pack-year smoking history. She has never used smokeless tobacco. She reports current alcohol use. She reports that she does not use drugs.  Allergies  Allergen Reactions  . Prozac [Fluoxetine] Itching  . Budesonide     Other reaction(s): Dizziness (per pt: still takes this medication)  . Buspar [Buspirone] Other (See Comments)    Ache joints  . Levaquin [Levofloxacin In D5w] Hives  . Propranolol Other (See Comments)    dizziness  . Zoloft [Sertraline Hcl] Other (See Comments)    Increased anxiety    Family History  Problem Relation Age of Onset  . Heart attack Mother        deceased from massive MI at age 42  . Liver disease Father        fatty liver; living, age 73  . Stroke Brother        at age 73, living   . Hypertension Brother        living, age 69    Prior to Admission medications   Medication Sig Start Date End Date Taking? Authorizing Provider  albuterol (PROVENTIL HFA;VENTOLIN HFA) 108 (90 Base) MCG/ACT inhaler Inhale 1-2 puffs into the lungs every 4 (four) hours as needed for wheezing or shortness of breath.   Yes [provider]  albuterol (PROVENTIL) (2.5 MG/3ML) 0.083% nebulizer solution Take 3 mLs (2.5 mg total) by nebulization every 6 (six) hours as needed for wheezing or shortness of breath. 07/10/19  Yes Parrett, Virgel Bouquet, NP  ALPRAZolam Prudy Feeler)  0.25 MG tablet Take 0.25 mg by mouth 2 (two) times daily.   Yes [provider]  escitalopram (LEXAPRO) 20 MG tablet Take 20 mg by mouth daily.   Yes [provider]  fluticasone (FLONASE) 50 MCG/ACT nasal spray USE 2 SPRAYS IN EACH NOSTRIL ONCE DAILY 05/01/18  Yes McQuaid, Ronie Spies, MD  Fluticasone-Umeclidin-Vilant (TRELEGY ELLIPTA) 100-62.5-25 MCG/INH AEPB Inhale 1 puff into the lungs daily. Pt needs appt for further refills. 10/12/20  Yes Parrett, Tammy S, NP  Ipratropium-Albuterol (COMBIVENT RESPIMAT) 20-100 MCG/ACT AERS  respimat INHALE 1 PUFF BY MOUTH EVERY 6 HOURS AS NEEDED FOR WHEEZING 07/10/19  Yes Parrett, Tammy S, NP  mirtazapine (REMERON) 30 MG tablet Take 30 mg by mouth at bedtime.   Yes [provider]  montelukast (SINGULAIR) 10 MG tablet Take 10 mg by mouth at bedtime.   Yes [provider]  azithromycin (ZITHROMAX) 250 MG tablet Take 500 mg daily for 2 days more Patient not taking: No sig reported 07/12/20   Wyvonnia Dusky, MD    Physical Exam: Vitals:   11/03/20 1737 11/03/20 2342 11/04/20 0253 11/04/20 0641  BP: (!) 161/107 131/78 (!) 158/79   Pulse: (!) 105 88 75 (!) 102  Resp: (!) 25 (!) 22 18 16   Temp: 98.5 F (36.9 C) 98.3 F (36.8 C)    TempSrc: Oral Oral  Oral  SpO2: 97% 97% 97% 96%  Weight: 67 kg     Height: 5\' 5"  (1.651 m)       Constitutional: No acute distress Head: Atraumatic Eyes: Conjunctiva clear ENM: Moist mucous membranes. Normal dentition.  Neck: Supple Respiratory: scattered exp wheeze, decreased breath sounds throughout Cardiovascular: Regular rate and rhythm. No murmurs/rubs/gallops. Abdomen: Non-tender, non-distended. No masses. No rebound or guarding. Positive bowel sounds. Musculoskeletal: No joint deformity upper and lower extremities. Normal ROM, no contractures. Normal muscle tone.  Skin: No rashes, lesions, or ulcers.  Extremities: No peripheral edema. Palpable peripheral pulses. Neurologic: Alert, moving all 4 extremities. Psychiatric: Normal insight and judgement.   Labs on Admission: I have personally reviewed following labs and imaging studies  CBC: Recent Labs  Lab 11/03/20 1739  WBC 8.4  HGB 13.0  HCT 39.6  MCV 94.5  PLT 621   Basic Metabolic Panel: Recent Labs  Lab 11/03/20 1739  NA 142  K 4.1  CL 98  CO2 33*  GLUCOSE 181*  BUN 10  CREATININE 0.73  CALCIUM 9.2   GFR: Estimated Creatinine Clearance: 63.9 mL/min (by C-G formula based on SCr of 0.73 mg/dL). Liver Function Tests: No results for input(s):  AST, ALT, ALKPHOS, BILITOT, PROT, ALBUMIN in the last 168 hours. No results for input(s): LIPASE, AMYLASE in the last 168 hours. No results for input(s): AMMONIA in the last 168 hours. Coagulation Profile: No results for input(s): INR, PROTIME in the last 168 hours. Cardiac Enzymes: No results for input(s): CKTOTAL, CKMB, CKMBINDEX, TROPONINI in the last 168 hours. BNP (last 3 results) No results for input(s): PROBNP in the last 8760 hours. HbA1C: No results for input(s): HGBA1C in the last 72 hours. CBG: No results for input(s): GLUCAP in the last 168 hours. Lipid Profile: No results for input(s): CHOL, HDL, LDLCALC, TRIG, CHOLHDL, LDLDIRECT in the last 72 hours. Thyroid Function Tests: No results for input(s): TSH, T4TOTAL, FREET4, T3FREE, THYROIDAB in the last 72 hours. Anemia Panel: No results for input(s): VITAMINB12, FOLATE, FERRITIN, TIBC, IRON, RETICCTPCT in the last 72 hours. Urine analysis:    Component Value Date/Time  COLORURINE STRAW (A) 12/03/2016 0101   APPEARANCEUR CLEAR (A) 12/03/2016 0101   APPEARANCEUR Clear 06/29/2013 2244   LABSPEC 1.006 12/03/2016 0101   LABSPEC 1.005 06/29/2013 2244   PHURINE 5.0 12/03/2016 0101   GLUCOSEU 150 (A) 12/03/2016 0101   GLUCOSEU Negative 06/29/2013 2244   HGBUR SMALL (A) 12/03/2016 0101   HGBUR trace-lysed 07/12/2009 1033   BILIRUBINUR NEGATIVE 12/03/2016 0101   BILIRUBINUR Negative 06/29/2013 2244   KETONESUR NEGATIVE 12/03/2016 0101   PROTEINUR NEGATIVE 12/03/2016 0101   UROBILINOGEN 0.2 08/08/2012 0107   NITRITE NEGATIVE 12/03/2016 0101   LEUKOCYTESUR NEGATIVE 12/03/2016 0101   LEUKOCYTESUR Trace 06/29/2013 2244    Radiological Exams on Admission: DG Chest 2 View  Result Date: 11/03/2020 CLINICAL DATA:  Shortness of breath EXAM: CHEST - 2 VIEW COMPARISON:  07/09/2020, 12/17/2017 FINDINGS: Hyperinflation with emphysematous disease and bullous change on the right. No acute consolidation or effusion. Stable  cardiomediastinal silhouette. No pneumothorax. Stable right apical scarring. IMPRESSION: No active cardiopulmonary disease. Emphysematous disease. Electronically Signed   By: Jasmine Pang M.D.   On: 11/03/2020 18:27     Assessment/Plan Active Problems:   COPD with acute exacerbation (HCC)    # COPD with acute exacerbation # Chronic hypoxic respiratory failure Several days increased dyspnea and sputum production. At rest is satting normally on home 3 L but says get very dyspneic with minimal activity and does not feel safe discharging at this point. CXR without acute process, no fevers or elevated wbc to suggest CAP. Covid/flu pcr negative. No chest pain to suggest ischemia. Hx takotsubo but that was multiple years ago, no LE swelling, and recent normal EF on TTE, do not think this is chf. - continue scheduled duonebs, albuterol prn - prednisone 40 daily - z-pack - sputum for culture - cont home trelegy, singulair. Hold home prn combivent - continue O2  # Hyperglycemia No dm dx - f/u a1c  # Elevated bp No htn dx, possibly 2/2 acute illness, beta agonist - monitor  # Anxiety disorder - cont home lexapro, mirtazapine  DVT prophylaxis: lovenox Code Status: full  Family Communication: none @ bedside  Consults called: none    Status is: Observation  The patient remains OBS appropriate and will d/c before 2 midnights.  Dispo: The patient is from: Home              Anticipated d/c is to: Home              Anticipated d/c date is: 1 day              Patient currently is not medically stable to d/c.        Silvano Bilis MD [facility] Pager [phone]  If 7PM-7AM, please contact night-coverage [email or web address] Password [facility]  11/04/2020, 9:52 AM

## 2020-11-04 NOTE — ED Notes (Signed)
Snacks provided and lights dimmed pt resting in bed in NAD at this time. Pt updated to the best of this RNs ability.

## 2020-11-04 NOTE — Care Management Obs Status (Signed)
MEDICARE OBSERVATION STATUS NOTIFICATION   Patient Details  Name: Heidi [name] MRN: [id number] Date of Birth: 25-Dec-1955   Medicare Observation Status Notification Given:  Yes    Ladson Cellar, RN 11/04/2020, 2:53 PM

## 2020-11-05 DIAGNOSIS — J441 Chronic obstructive pulmonary disease with (acute) exacerbation: Secondary | ICD-10-CM | POA: Diagnosis not present

## 2020-11-05 LAB — BASIC METABOLIC PANEL
Anion gap: 11 (ref 5–15)
BUN: 12 mg/dL (ref 8–23)
CO2: 34 mmol/L — ABNORMAL HIGH (ref 22–32)
Calcium: 9.1 mg/dL (ref 8.9–10.3)
Chloride: 94 mmol/L — ABNORMAL LOW (ref 98–111)
Creatinine, Ser: 0.78 mg/dL (ref 0.44–1.00)
GFR, Estimated: >60 mL/min (ref >60)
Glucose, Bld: 98 mg/dL (ref 70–99)
Potassium: 3.8 mmol/L (ref 3.5–5.1)
Sodium: 139 mmol/L (ref 135–145)

## 2020-11-05 LAB — HEMOGLOBIN A1C
Hgb A1c MFr Bld: 5.8 % — ABNORMAL HIGH (ref 4.8–5.6)
Mean Plasma Glucose: 119.76 mg/dL

## 2020-11-05 MED ORDER — IPRATROPIUM-ALBUTEROL 0.5-2.5 (3) MG/3ML IN SOLN: 3.0000 mL | RESPIRATORY_TRACT | 2 refills | Status: AC | PRN

## 2020-11-05 MED ORDER — AZITHROMYCIN 250 MG PO TABS: ORAL_TABLET | ORAL | 0 refills | Status: DC

## 2020-11-05 MED ORDER — DM-GUAIFENESIN ER 30-600 MG PO TB12
1.0000 | ORAL_TABLET | Freq: Two times a day (BID) | ORAL | Status: DC
  Administered 2020-11-05: 1 via ORAL
  Filled 2020-11-05: qty 1

## 2020-11-05 MED ORDER — GUAIFENESIN-CODEINE 100-10 MG/5ML PO SOLN: 5.0000 mL | Freq: Four times a day (QID) | ORAL | 0 refills | Status: DC | PRN

## 2020-11-05 MED ORDER — PREDNISONE 20 MG PO TABS: 40.0000 mg | ORAL_TABLET | Freq: Every day | ORAL | 0 refills | Status: DC

## 2020-11-05 MED ORDER — GUAIFENESIN-DM 100-10 MG/5ML PO SYRP: 5.0000 mL | ORAL_SOLUTION | ORAL | 0 refills | Status: DC | PRN

## 2020-11-05 NOTE — Discharge Summary (Signed)
Physician Discharge Summary  Heidi [name] NLZ:[id number] DOB: [birth year] DOA: 11/04/2020  PCP: Patrice Paradise, MD  Admit date: 11/04/2020 Discharge date: 11/05/2020  Admitted From: Home  Disposition:  Home   Recommendations for Outpatient Follow-up:  1. Follow up with PCP Dr. Merlinda Frederick in 1 week       Home Health: None  Equipment/Devices: None new  Discharge Condition: Fair  CODE STATUS: FULL Diet recommendation: Regular  Brief/Interim Summary: Heidi [name] is a 65 y.o. F with COPD on home O2, presenting with a few days of URI symptoms, now with wheezing and shortness of breath.  In the ER, chest x-ray showed emphysematous changes, no focal consolidation.  She was started on steroids, antibiotics, and bronchodilators.     PRINCIPAL HOSPITAL DIAGNOSIS: COPD Exacerbation    Discharge Diagnoses:   Acute exacerbation of chronic obstructive pulmonary disease Chronic hypoxic respiratory failure Patient was admitted and started on Solu-Medrol, azithromycin, and frequent bronchodilators.  During the course of the day today, the patient ambulated with nursing, felt like her symptoms are close to baseline.  She was discharged with antibiotics, steroids, refills of DuoNeb, and codeine.  Recommended continuing home montelukast, as well as aggressive pulmonary toilet.   Coronary artery disease No active disease.     Mood disorder Continue mirtazapine, escitalopram               Discharge Instructions  Discharge Instructions    Diet - low sodium heart healthy   Complete by: As directed    Discharge instructions   Complete by: As directed    From Dr. Maryfrances Bunnell: You were admitted for a COPD flare  You were treated with steroids and antibiotics  You should continue steroids with prednisone 40 mg (two tabs) for 4 more days You should continue antibiotics with azithromycin 250 mg for 4 more days  I sent in a prescription for Duo-neb fluid for your  nebulizer (albuterol plus ipratropium) Use this four times a day plus extra as needed  For cough syrup during the day, use dextromethorphan-Guiafenesin (Robitussin DM or equivalent, over the counter)  At night, take codeine with guiafenesin Avoid using Codeine with alprazolam, they are sedating in combination  Go see your primary care doctor as soon as able   Increase activity slowly   Complete by: As directed      Allergies as of 11/05/2020      Reactions   Prozac [fluoxetine] Itching   Budesonide    Other reaction(s): Dizziness (per Heidi [name]: still takes this medication)   Buspar [buspirone] Other (See Comments)   Ache joints   Levaquin [levofloxacin In D5w] Hives   Propranolol Other (See Comments)   dizziness   Zoloft [sertraline Hcl] Other (See Comments)   Increased anxiety      Medication List    TAKE these medications   albuterol 108 (90 Base) MCG/ACT inhaler Commonly known as: VENTOLIN HFA Inhale 1-2 puffs into the lungs every 4 (four) hours as needed for wheezing or shortness of breath.   albuterol (2.5 MG/3ML) 0.083% nebulizer solution Commonly known as: PROVENTIL Take 3 mLs (2.5 mg total) by nebulization every 6 (six) hours as needed for wheezing or shortness of breath.   ALPRAZolam 0.25 MG tablet Commonly known as: XANAX Take 0.25 mg by mouth 2 (two) times daily.   azithromycin 250 MG tablet Commonly known as: ZITHROMAX Take 500 mg daily for 2 days more   Combivent Respimat 20-100 MCG/ACT Aers respimat Generic drug: Ipratropium-Albuterol INHALE 1 PUFF BY  MOUTH EVERY 6 HOURS AS NEEDED FOR WHEEZING What changed: Another medication with the same name was added. Make sure you understand how and when to take each.   ipratropium-albuterol 0.5-2.5 (3) MG/3ML Soln Commonly known as: DUONEB Take 3 mLs by nebulization every 4 (four) hours as needed. What changed: You were already taking a medication with the same name, and this prescription was added. Make sure you  understand how and when to take each.   escitalopram 20 MG tablet Commonly known as: LEXAPRO Take 20 mg by mouth daily.   fluticasone 50 MCG/ACT nasal spray Commonly known as: FLONASE USE 2 SPRAYS IN EACH NOSTRIL ONCE DAILY   guaiFENesin-codeine 100-10 MG/5ML syrup Take 5 mLs by mouth every 6 (six) hours as needed for cough.   guaiFENesin-dextromethorphan 100-10 MG/5ML syrup Commonly known as: ROBITUSSIN DM Take 5 mLs by mouth every 4 (four) hours as needed for cough.   mirtazapine 30 MG tablet Commonly known as: REMERON Take 30 mg by mouth at bedtime.   montelukast 10 MG tablet Commonly known as: SINGULAIR Take 10 mg by mouth at bedtime.   predniSONE 20 MG tablet Commonly known as: DELTASONE Take 2 tablets (40 mg total) by mouth daily with breakfast. Start taking on: November 06, 2020   Trelegy Ellipta 100-62.5-25 MCG/INH Aepb Generic drug: Fluticasone-Umeclidin-Vilant Inhale 1 puff into the lungs daily. Heidi [name] needs appt for further refills.       Follow-up Information    Patrice Paradise, MD. Schedule an appointment as soon as possible for a visit in 1 week(s).   Specialty: Physician Assistant Contact information: [phone] [facility] [address] [facility] [place] [address] [phone]              Allergies  Allergen Reactions  . Prozac [Fluoxetine] Itching  . Budesonide     Other reaction(s): Dizziness (per Heidi [name]: still takes this medication)  . Buspar [Buspirone] Other (See Comments)    Ache joints  . Levaquin [Levofloxacin In D5w] Hives  . Propranolol Other (See Comments)    dizziness  . Zoloft [Sertraline Hcl] Other (See Comments)    Increased anxiety     Procedures/Studies: DG Chest 2 View  Result Date: 11/03/2020 CLINICAL DATA:  Shortness of breath EXAM: CHEST - 2 VIEW COMPARISON:  07/09/2020, 12/17/2017 FINDINGS: Hyperinflation with emphysematous disease and bullous change on the right. No acute consolidation or effusion. Stable  cardiomediastinal silhouette. No pneumothorax. Stable right apical scarring. IMPRESSION: No active cardiopulmonary disease. Emphysematous disease. Electronically Signed   By: Jasmine Pang M.D.   On: 11/03/2020 18:27       Subjective: Patient still coughing a lot, but feels at baseline, no confusion, sputum, fever, respiratory distress.   Discharge Exam: Vitals:   11/05/20 0749 11/05/20 1248  BP: 138/62 134/63  Pulse: 92 99  Resp:  20  Temp: 97.9 F (36.6 C)   SpO2: 100% 97%   Vitals:   11/04/20 2006 11/05/20 0414 11/05/20 0749 11/05/20 1248  BP: (!) 170/80 130/75 138/62 134/63  Pulse: (!) 105 80 92 99  Resp: 16   20  Temp: 98 F (36.7 C) (!) 97.5 F (36.4 C) 97.9 F (36.6 C)   TempSrc: Oral Oral Oral   SpO2: 99% 100% 100% 97%  Weight:      Height:        General: Heidi [name] is alert, awake, not in acute distress Cardiovascular: RRR, nl S1-S2, no murmurs appreciated.   No LE edema.   Respiratory: Diminished air movement,  wheezing.   Abdominal: Abdomen soft and non-tender.  No distension or HSM.   Neuro/Psych: Strength symmetric in upper and lower extremities.  Judgment and insight appear normal.   The results of significant diagnostics from this hospitalization (including imaging, microbiology, ancillary and laboratory) are listed below for reference.     Microbiology: Recent Results (from the past 240 hour(s))  Resp Panel by RT-PCR (Flu A&B, Covid) Nasopharyngeal Swab     Status: None   Collection Time: 11/04/20  5:43 AM   Specimen: Nasopharyngeal Swab; Nasopharyngeal(NP) swabs in vial transport medium  Result Value Ref Range Status   SARS Coronavirus 2 by RT PCR NEGATIVE NEGATIVE Final    Comment: (NOTE) SARS-CoV-2 target nucleic acids are NOT DETECTED.  The SARS-CoV-2 RNA is generally detectable in upper respiratory specimens during the acute phase of infection. The lowest concentration of SARS-CoV-2 viral copies this assay can detect is 138 copies/mL. A negative  result does not preclude SARS-Cov-2 infection and should not be used as the sole basis for treatment or other patient management decisions. A negative result may occur with  improper specimen collection/handling, submission of specimen other than nasopharyngeal swab, presence of viral mutation(s) within the areas targeted by this assay, and inadequate number of viral copies(<138 copies/mL). A negative result must be combined with clinical observations, patient history, and epidemiological information. The expected result is Negative.  Fact Sheet for Patients:  BloggerCourse.com  Fact Sheet for Healthcare Providers:  SeriousBroker.it  This test is no t yet approved or cleared by the [place] FDA and  has been authorized for detection and/or diagnosis of SARS-CoV-2 by FDA under an Emergency Use Authorization (EUA). This EUA will remain  in effect (meaning this test can be used) for the duration of the COVID-19 declaration under Section 564(b)(1) of the Act, 21 U.S.C.section 360bbb-3(b)(1), unless the authorization is terminated  or revoked sooner.       Influenza A by PCR NEGATIVE NEGATIVE Final   Influenza B by PCR NEGATIVE NEGATIVE Final    Comment: (NOTE) The Xpert Xpress SARS-CoV-2/FLU/RSV plus assay is intended as an aid in the diagnosis of influenza from Nasopharyngeal swab specimens and should not be used as a sole basis for treatment. Nasal washings and aspirates are unacceptable for Xpert Xpress SARS-CoV-2/FLU/RSV testing.  Fact Sheet for Patients: BloggerCourse.com  Fact Sheet for Healthcare Providers: SeriousBroker.it  This test is not yet approved or cleared by the [place] FDA and has been authorized for detection and/or diagnosis of SARS-CoV-2 by FDA under an Emergency Use Authorization (EUA). This EUA will remain in effect (meaning this test can be used)  for the duration of the COVID-19 declaration under Section 564(b)(1) of the Act, 21 U.S.C. section 360bbb-3(b)(1), unless the authorization is terminated or revoked.  Performed at [facility], [address] [place], [place] [address]      Labs: BNP (last 3 results) Recent Labs    07/09/20 1251  BNP 42.9   Basic Metabolic Panel: Recent Labs  Lab 11/03/20 1739 11/04/20 1348 11/05/20 0242  NA 142  --  139  K 4.1  --  3.8  CL 98  --  94*  CO2 33*  --  34*  GLUCOSE 181*  --  98  BUN 10  --  12  CREATININE 0.73 0.74 0.78  CALCIUM 9.2  --  9.1   Liver Function Tests: No results for input(s): AST, ALT, ALKPHOS, BILITOT, PROT, ALBUMIN in the last 168 hours. No results for input(s):  LIPASE, AMYLASE in the last 168 hours. No results for input(s): AMMONIA in the last 168 hours. CBC: Recent Labs  Lab 11/03/20 1739 11/04/20 1348  WBC 8.4 10.3  HGB 13.0 12.6  HCT 39.6 39.1  MCV 94.5 95.4  PLT 340 376   Cardiac Enzymes: No results for input(s): CKTOTAL, CKMB, CKMBINDEX, TROPONINI in the last 168 hours. BNP: Invalid input(s): POCBNP CBG: No results for input(s): GLUCAP in the last 168 hours. D-Dimer No results for input(s): DDIMER in the last 72 hours. Hgb A1c Recent Labs    11/05/20 0242  HGBA1C 5.8*   Lipid Profile No results for input(s): CHOL, HDL, LDLCALC, TRIG, CHOLHDL, LDLDIRECT in the last 72 hours. Thyroid function studies No results for input(s): TSH, T4TOTAL, T3FREE, THYROIDAB in the last 72 hours.  Invalid input(s): FREET3 Anemia work up No results for input(s): VITAMINB12, FOLATE, FERRITIN, TIBC, IRON, RETICCTPCT in the last 72 hours. Urinalysis    Component Value Date/Time   COLORURINE STRAW (A) 12/03/2016 0101   APPEARANCEUR CLEAR (A) 12/03/2016 0101   APPEARANCEUR Clear 06/29/2013 2244   LABSPEC 1.006 12/03/2016 0101   LABSPEC 1.005 06/29/2013 2244   PHURINE 5.0 12/03/2016 0101   GLUCOSEU 150 (A) 12/03/2016 0101   GLUCOSEU  Negative 06/29/2013 2244   HGBUR SMALL (A) 12/03/2016 0101   HGBUR trace-lysed 07/12/2009 1033   BILIRUBINUR NEGATIVE 12/03/2016 0101   BILIRUBINUR Negative 06/29/2013 2244   KETONESUR NEGATIVE 12/03/2016 0101   PROTEINUR NEGATIVE 12/03/2016 0101   UROBILINOGEN 0.2 08/08/2012 0107   NITRITE NEGATIVE 12/03/2016 0101   LEUKOCYTESUR NEGATIVE 12/03/2016 0101   LEUKOCYTESUR Trace 06/29/2013 2244   Sepsis Labs Invalid input(s): PROCALCITONIN,  WBC,  LACTICIDVEN Microbiology Recent Results (from the past 240 hour(s))  Resp Panel by RT-PCR (Flu A&B, Covid) Nasopharyngeal Swab     Status: None   Collection Time: 11/04/20  5:43 AM   Specimen: Nasopharyngeal Swab; Nasopharyngeal(NP) swabs in vial transport medium  Result Value Ref Range Status   SARS Coronavirus 2 by RT PCR NEGATIVE NEGATIVE Final    Comment: (NOTE) SARS-CoV-2 target nucleic acids are NOT DETECTED.  The SARS-CoV-2 RNA is generally detectable in upper respiratory specimens during the acute phase of infection. The lowest concentration of SARS-CoV-2 viral copies this assay can detect is 138 copies/mL. A negative result does not preclude SARS-Cov-2 infection and should not be used as the sole basis for treatment or other patient management decisions. A negative result may occur with  improper specimen collection/handling, submission of specimen other than nasopharyngeal swab, presence of viral mutation(s) within the areas targeted by this assay, and inadequate number of viral copies(<138 copies/mL). A negative result must be combined with clinical observations, patient history, and epidemiological information. The expected result is Negative.  Fact Sheet for Patients:  EntrepreneurPulse.com.au  Fact Sheet for Healthcare Providers:  IncredibleEmployment.be  This test is no t yet approved or cleared by the [place] FDA and  has been authorized for detection and/or diagnosis of  SARS-CoV-2 by FDA under an Emergency Use Authorization (EUA). This EUA will remain  in effect (meaning this test can be used) for the duration of the COVID-19 declaration under Section 564(b)(1) of the Act, 21 U.S.C.section 360bbb-3(b)(1), unless the authorization is terminated  or revoked sooner.       Influenza A by PCR NEGATIVE NEGATIVE Final   Influenza B by PCR NEGATIVE NEGATIVE Final    Comment: (NOTE) The Xpert Xpress SARS-CoV-2/FLU/RSV plus assay is intended as an aid in the diagnosis of  influenza from Nasopharyngeal swab specimens and should not be used as a sole basis for treatment. Nasal washings and aspirates are unacceptable for Xpert Xpress SARS-CoV-2/FLU/RSV testing.  Fact Sheet for Patients: BloggerCourse.com  Fact Sheet for Healthcare Providers: SeriousBroker.it  This test is not yet approved or cleared by the [place] FDA and has been authorized for detection and/or diagnosis of SARS-CoV-2 by FDA under an Emergency Use Authorization (EUA). This EUA will remain in effect (meaning this test can be used) for the duration of the COVID-19 declaration under Section 564(b)(1) of the Act, 21 U.S.C. section 360bbb-3(b)(1), unless the authorization is terminated or revoked.  Performed at [facility], [address], [place], [place] [address]      Time coordinating discharge: 25 minutes The [place] controlled substances registry was reviewed for this patient; her discharge script was for cough, not acute pain, and the [place] STOP act does not apply.      SIGNED:   Alberteen Sam, MD  [facility] 11/05/2020, 10:47 PM

## 2020-11-05 NOTE — Care Plan (Signed)
Pt ambulated to door in room and then to bathroom at this time with stand by assist. Pt tolerated well. No shortness of breath noted, patient denies distress.

## 2020-11-05 NOTE — Progress Notes (Signed)
[facility] PROGRESS NOTE    AXELLE SZWED  MWN:[id number] DOB: [birth year] DOA: 11/04/2020 PCP: Patrice Paradise, MD      Brief Narrative:  Mrs. Ikner is a 65 y.o. F with COPD on home o2, presenting with a few days of URI symptoms, now with wheezing and shortness of breath.  In the ER, chest x-ray showed emphysematous changes, no focal consolidation.  She was started on steroids, antibiotics, and bronchodilators.      Assessment & Plan:  COPD exacerbation -Continue prednisone - Continue azithromycin - Continue frequent bronchodilators - Continue ICS/LAMA/LAMA - Start Mucinex - Start flutter valve - Continue montelukast   Coronary artery disease No active disease.  Should be on a aspirin and statin.  Mood disorder - Continue mirtazapine, escitalopram        Disposition:            MDM: The below labs and imaging reports were reviewed and summarized above.  Medication management as above.  The patietn remains severely out of breath, only able to walk a few feet wtihout dyspnea.    DVT prophylaxis: enoxaparin (LOVENOX) injection 40 mg Start: 11/04/20 2200  Code Status: FULL        Subjective: The patient is wheezing, coughing frequently, no productive cough.  No fever.  No chest pain.  No leg swelling orthopnea.  Objective: Vitals:   11/04/20 1641 11/04/20 2006 11/05/20 0414 11/05/20 0749  BP: 126/64 (!) 170/80 130/75 138/62  Pulse: 100 (!) 105 80 92  Resp: 16 16    Temp: 98.5 F (36.9 C) 98 F (36.7 C) (!) 97.5 F (36.4 C)   TempSrc: Oral Oral Oral   SpO2: 99% 99% 100% 100%  Weight:      Height:        Intake/Output Summary (Last 24 hours) at 11/05/2020 4034 Last data filed at 11/04/2020 2157 Gross per 24 hour  Intake 3 ml  Output --  Net 3 ml   Filed Weights   11/03/20 1737  Weight: 67 kg    Examination: General appearance:  adult female, alert and in moderate repsiratoyr distress after coughing  spell.   HEENT: Anicteric, conjunctiva pink, lids and lashes normal. No nasal deformity, discharge, epistaxis.  Lips moist.   Skin: Warm and dry.  no jaundice.  No suspicious rashes or lesions. Cardiac: RRR, nl S1-S2, no murmurs appreciated.  Capillary refill is brisk.  No LE edema.  Radial pulses 2+ and symmetric. Respiratory: Tachypneic, pursed lip breathing, poor air movement, wheezing throughout. Abdomen: Abdomen soft.  No TTP or gaurding. No ascites, distension, hepatosplenomegaly.   MSK: No deformities or effusions. Neuro: Awake and alert.  EOMI, moves all extremities. Speech fluent.    Psych: Sensorium intact and responding to questions, attention normal. Affect normal.  Judgment and insight appear normal.    Data Reviewed: I have personally reviewed following labs and imaging studies:  CBC: Recent Labs  Lab 11/03/20 1739 11/04/20 1348  WBC 8.4 10.3  HGB 13.0 12.6  HCT 39.6 39.1  MCV 94.5 95.4  PLT 340 376   Basic Metabolic Panel: Recent Labs  Lab 11/03/20 1739 11/04/20 1348 11/05/20 0242  NA 142  --  139  K 4.1  --  3.8  CL 98  --  94*  CO2 33*  --  34*  GLUCOSE 181*  --  98  BUN 10  --  12  CREATININE 0.73 0.74 0.78  CALCIUM 9.2  --  9.1  GFR: Estimated Creatinine Clearance: 63.9 mL/min (by C-G formula based on SCr of 0.78 mg/dL). Liver Function Tests: No results for input(s): AST, ALT, ALKPHOS, BILITOT, PROT, ALBUMIN in the last 168 hours. No results for input(s): LIPASE, AMYLASE in the last 168 hours. No results for input(s): AMMONIA in the last 168 hours. Coagulation Profile: No results for input(s): INR, PROTIME in the last 168 hours. Cardiac Enzymes: No results for input(s): CKTOTAL, CKMB, CKMBINDEX, TROPONINI in the last 168 hours. BNP (last 3 results) No results for input(s): PROBNP in the last 8760 hours. HbA1C: No results for input(s): HGBA1C in the last 72 hours. CBG: No results for input(s): GLUCAP in the last 168 hours. Lipid Profile: No  results for input(s): CHOL, HDL, LDLCALC, TRIG, CHOLHDL, LDLDIRECT in the last 72 hours. Thyroid Function Tests: No results for input(s): TSH, T4TOTAL, FREET4, T3FREE, THYROIDAB in the last 72 hours. Anemia Panel: No results for input(s): VITAMINB12, FOLATE, FERRITIN, TIBC, IRON, RETICCTPCT in the last 72 hours. Urine analysis:    Component Value Date/Time   COLORURINE STRAW (A) 12/03/2016 0101   APPEARANCEUR CLEAR (A) 12/03/2016 0101   APPEARANCEUR Clear 06/29/2013 2244   LABSPEC 1.006 12/03/2016 0101   LABSPEC 1.005 06/29/2013 2244   PHURINE 5.0 12/03/2016 0101   GLUCOSEU 150 (A) 12/03/2016 0101   GLUCOSEU Negative 06/29/2013 2244   HGBUR SMALL (A) 12/03/2016 0101   HGBUR trace-lysed 07/12/2009 1033   BILIRUBINUR NEGATIVE 12/03/2016 0101   BILIRUBINUR Negative 06/29/2013 2244   KETONESUR NEGATIVE 12/03/2016 0101   PROTEINUR NEGATIVE 12/03/2016 0101   UROBILINOGEN 0.2 08/08/2012 0107   NITRITE NEGATIVE 12/03/2016 0101   LEUKOCYTESUR NEGATIVE 12/03/2016 0101   LEUKOCYTESUR Trace 06/29/2013 2244   Sepsis Labs: @LABRCNTIP (procalcitonin:4,lacticacidven:4)  ) Recent Results (from the past 240 hour(s))  Resp Panel by RT-PCR (Flu A&B, Covid) Nasopharyngeal Swab     Status: None   Collection Time: 11/04/20  5:43 AM   Specimen: Nasopharyngeal Swab; Nasopharyngeal(NP) swabs in vial transport medium  Result Value Ref Range Status   SARS Coronavirus 2 by RT PCR NEGATIVE NEGATIVE Final    Comment: (NOTE) SARS-CoV-2 target nucleic acids are NOT DETECTED.  The SARS-CoV-2 RNA is generally detectable in upper respiratory specimens during the acute phase of infection. The lowest concentration of SARS-CoV-2 viral copies this assay can detect is 138 copies/mL. A negative result does not preclude SARS-Cov-2 infection and should not be used as the sole basis for treatment or other patient management decisions. A negative result may occur with  improper specimen collection/handling,  submission of specimen other than nasopharyngeal swab, presence of viral mutation(s) within the areas targeted by this assay, and inadequate number of viral copies(<138 copies/mL). A negative result must be combined with clinical observations, patient history, and epidemiological information. The expected result is Negative.  Fact Sheet for Patients:  01/02/21  Fact Sheet for Healthcare Providers:  BloggerCourse.com  This test is no t yet approved or cleared by the [place] FDA and  has been authorized for detection and/or diagnosis of SARS-CoV-2 by FDA under an Emergency Use Authorization (EUA). This EUA will remain  in effect (meaning this test can be used) for the duration of the COVID-19 declaration under Section 564(b)(1) of the Act, 21 U.S.C.section 360bbb-3(b)(1), unless the authorization is terminated  or revoked sooner.       Influenza A by PCR NEGATIVE NEGATIVE Final   Influenza B by PCR NEGATIVE NEGATIVE Final    Comment: (NOTE) The Xpert Xpress SARS-CoV-2/FLU/RSV plus assay is intended  as an aid in the diagnosis of influenza from Nasopharyngeal swab specimens and should not be used as a sole basis for treatment. Nasal washings and aspirates are unacceptable for Xpert Xpress SARS-CoV-2/FLU/RSV testing.  Fact Sheet for Patients: EntrepreneurPulse.com.au  Fact Sheet for Healthcare Providers: IncredibleEmployment.be  This test is not yet approved or cleared by the [place] FDA and has been authorized for detection and/or diagnosis of SARS-CoV-2 by FDA under an Emergency Use Authorization (EUA). This EUA will remain in effect (meaning this test can be used) for the duration of the COVID-19 declaration under Section 564(b)(1) of the Act, 21 U.S.C. section 360bbb-3(b)(1), unless the authorization is terminated or revoked.  Performed at [facility], [address] [place],  [address]          Radiology Studies: DG Chest 2 View  Result Date: 11/03/2020 CLINICAL DATA:  Shortness of breath EXAM: CHEST - 2 VIEW COMPARISON:  07/09/2020, 12/17/2017 FINDINGS: Hyperinflation with emphysematous disease and bullous change on the right. No acute consolidation or effusion. Stable cardiomediastinal silhouette. No pneumothorax. Stable right apical scarring. IMPRESSION: No active cardiopulmonary disease. Emphysematous disease. Electronically Signed   By: Donavan Foil M.D.   On: 11/03/2020 18:27        Scheduled Meds: . ALPRAZolam  0.25 mg Oral BID  . azithromycin  250 mg Oral Daily  . dextromethorphan-guaiFENesin  1 tablet Oral BID  . enoxaparin (LOVENOX) injection  40 mg Subcutaneous Q24H  . escitalopram  20 mg Oral Daily  . fluticasone furoate-vilanterol  1 puff Inhalation Daily   And  . umeclidinium bromide  1 puff Inhalation Daily  . ipratropium-albuterol  3 mL Nebulization Q6H  . mirtazapine  30 mg Oral QHS  . montelukast  10 mg Oral QHS  . predniSONE  40 mg Oral Q breakfast  . sodium chloride flush  3 mL Intravenous Q12H   Continuous Infusions: . sodium chloride       LOS: 1 day    Time spent: 35 minutes    Edwin Dada, MD [facility] 11/05/2020, 9:04 AM     Please page though [place] or Epic secure chat:  For [employer], Adult nurse

## 2020-11-05 NOTE — Care Plan (Signed)
Pt given AVS and educated about medications at this time. IV removed. Pt denies questions.

## 2020-11-05 NOTE — Plan of Care (Signed)

## 2020-11-17 ENCOUNTER — Encounter: Payer: Medicare Other | Attending: Pulmonary Disease

## 2020-11-17 DIAGNOSIS — J449 Chronic obstructive pulmonary disease, unspecified: Secondary | ICD-10-CM | POA: Insufficient documentation

## 2020-11-18 ENCOUNTER — Telehealth: Payer: Self-pay

## 2020-11-18 NOTE — Telephone Encounter (Signed)
Heidi [name] sees Dr on  Tuesday and will get clearance to come back.

## 2020-11-24 ENCOUNTER — Encounter: Payer: Self-pay | Admitting: *Deleted

## 2020-11-24 ENCOUNTER — Telehealth: Payer: Self-pay

## 2020-11-24 DIAGNOSIS — J449 Chronic obstructive pulmonary disease, unspecified: Secondary | ICD-10-CM

## 2020-11-24 NOTE — Progress Notes (Signed)
Pulmonary Individual Treatment Plan  Patient Details  Name: Heidi [name] MRN: [id number] Date of Birth: [birth year] Referring Provider:   Flowsheet Row Pulmonary Rehab from 08/16/2020 in [facility] Cardiac and Pulmonary Rehab  Referring Provider Aleskerov      Initial Encounter Date:  Flowsheet Row Pulmonary Rehab from 08/16/2020 in [facility] Cardiac and Pulmonary Rehab  Date 08/16/20      Visit Diagnosis: Chronic obstructive pulmonary disease, unspecified COPD type (HCC)  Patient's Home Medications on Admission:  Current Outpatient Medications:  .  albuterol (PROVENTIL HFA;VENTOLIN HFA) 108 (90 Base) MCG/ACT inhaler, Inhale 1-2 puffs into the lungs every 4 (four) hours as needed for wheezing or shortness of breath., Disp: , Rfl:  .  albuterol (PROVENTIL) (2.5 MG/3ML) 0.083% nebulizer solution, Take 3 mLs (2.5 mg total) by nebulization every 6 (six) hours as needed for wheezing or shortness of breath., Disp: 360 mL, Rfl: 5 .  ALPRAZolam (XANAX) 0.25 MG tablet, Take 0.25 mg by mouth 2 (two) times daily., Disp: , Rfl:  .  azithromycin (ZITHROMAX) 250 MG tablet, Take 500 mg daily for 2 days more, Disp: 4 tablet, Rfl: 0 .  escitalopram (LEXAPRO) 20 MG tablet, Take 20 mg by mouth daily., Disp: , Rfl:  .  fluticasone (FLONASE) 50 MCG/ACT nasal spray, USE 2 SPRAYS IN EACH NOSTRIL ONCE DAILY, Disp: 16 g, Rfl: 5 .  Fluticasone-Umeclidin-Vilant (TRELEGY ELLIPTA) 100-62.5-25 MCG/INH AEPB, Inhale 1 puff into the lungs daily. Pt needs appt for further refills., Disp: 180 each, Rfl: 0 .  guaiFENesin-codeine 100-10 MG/5ML syrup, Take 5 mLs by mouth every 6 (six) hours as needed for cough., Disp: 118 mL, Rfl: 0 .  guaiFENesin-dextromethorphan (ROBITUSSIN DM) 100-10 MG/5ML syrup, Take 5 mLs by mouth every 4 (four) hours as needed for cough., Disp: 118 mL, Rfl: 0 .  Ipratropium-Albuterol (COMBIVENT RESPIMAT) 20-100 MCG/ACT AERS respimat, INHALE 1 PUFF BY MOUTH EVERY 6 HOURS AS NEEDED FOR WHEEZING, Disp: 4 g,  Rfl: 5 .  ipratropium-albuterol (DUONEB) 0.5-2.5 (3) MG/3ML SOLN, Take 3 mLs by nebulization every 4 (four) hours as needed., Disp: 360 mL, Rfl: 2 .  mirtazapine (REMERON) 30 MG tablet, Take 30 mg by mouth at bedtime., Disp: , Rfl:  .  montelukast (SINGULAIR) 10 MG tablet, Take 10 mg by mouth at bedtime., Disp: , Rfl:  .  predniSONE (DELTASONE) 20 MG tablet, Take 2 tablets (40 mg total) by mouth daily with breakfast., Disp: 8 tablet, Rfl: 0  Past Medical History: Past Medical History:  Diagnosis Date  . Anxiety   . COPD (chronic obstructive pulmonary disease) (HCC)   . GERD (gastroesophageal reflux disease)   . Hypotension    limiting med titration  . NSTEMI (non-ST elevated myocardial infarction) [facility]) 2013   12/2011 with normal coronaries possibly secondary to Takotsubo (EF 30-35% by echo), occurred following two episodes of acute respiratory distress  . Pneumonia 11/2016   history of  . Takotsubo syndrome 4/13    Tobacco Use: Social History   Tobacco Use  Smoking Status Former Smoker  . Packs/day: 1.00  . Years: 30.00  . Pack years: 30.00  . Types: Cigarettes  . Quit date: 01/21/2012  . Years since quitting: 8.8  Smokeless Tobacco Never Used    Labs: Recent Review Flowsheet Data    Labs for ITP Cardiac and Pulmonary Rehab Latest Ref Rng & Units 08/08/2012 04/04/2019 07/09/2020 07/09/2020 11/05/2020   Cholestrol 0 - 200 mg/dL - - - - -   LDLCALC 0 - 99 mg/dL - - - - -  HDL >39 mg/dL - - - - -   Trlycerides <150 mg/dL - - - - -   Hemoglobin A1c 4.8 - 5.6 % - - - - 5.8(H)   PHART - 7.332(L) 7.46 - - -   PCO2ART mmHg 47.3(H) 41 - - -   HCO3 20.0 - 28.0 mmol/L 25.1(H) 29.2 31.9(H) 31.6(H) -   TCO2 0 - 100 mmol/L 27 - - - -   ACIDBASEDEF 0.0 - 2.0 mmol/L 1.0 - - - -   O2SAT % 99.0 97.5 94.0 92.4 -       Pulmonary Assessment Scores:  Pulmonary Assessment Scores    Row Name 08/16/20 1618         ADL UCSD   ADL Phase Entry     SOB Score total 65     Rest 1     Walk 2      Stairs 5     Bath 1     Dress 1     Shop 2           CAT Score   CAT Score 29           mMRC Score   mMRC Score 3            UCSD: Self-administered rating of dyspnea associated with activities of daily living (ADLs) 6-point scale (0 = "not at all" to 5 = "maximal or unable to do because of breathlessness")  Scoring Scores range from 0 to 120.  Minimally important difference is 5 units  CAT: CAT can identify the health impairment of COPD patients and is better correlated with disease progression.  CAT has a scoring range of zero to 40. The CAT score is classified into four groups of low (less than 10), medium (10 - 20), high (21-30) and very high (31-40) based on the impact level of disease on health status. A CAT score over 10 suggests significant symptoms.  A worsening CAT score could be explained by an exacerbation, poor medication adherence, poor inhaler technique, or progression of COPD or comorbid conditions.  CAT MCID is 2 points  mMRC: mMRC (Modified Medical Research Council) Dyspnea Scale is used to assess the degree of baseline functional disability in patients of respiratory disease due to dyspnea. No minimal important difference is established. A decrease in score of 1 point or greater is considered a positive change.   Pulmonary Function Assessment:   Exercise Target Goals: Exercise Program Goal: Individual exercise prescription set using results from initial 6 min walk test and THRR while considering  patient's activity barriers and safety.   Exercise Prescription Goal: Initial exercise prescription builds to 30-45 minutes a day of aerobic activity, 2-3 days per week.  Home exercise guidelines will be given to patient during program as part of exercise prescription that the participant will acknowledge.  Education: Aerobic Exercise: - Group verbal and visual presentation on the components of exercise prescription. Introduces F.I.T.T principle from ACSM for  exercise prescriptions.  Reviews F.I.T.T. principles of aerobic exercise including progression. Written material given at graduation.   Education: Resistance Exercise: - Group verbal and visual presentation on the components of exercise prescription. Introduces F.I.T.T principle from ACSM for exercise prescriptions  Reviews F.I.T.T. principles of resistance exercise including progression. Written material given at graduation.    Education: Exercise & Equipment Safety: - Individual verbal instruction and demonstration of equipment use and safety with use of the equipment. Flowsheet Row Pulmonary Rehab from 10/13/2020 in [facility] Cardiac and Pulmonary  Rehab  Date 08/16/20  Educator AS  Instruction Review Code 1- Verbalizes Understanding      Education: Exercise Physiology & General Exercise Guidelines: - Group verbal and written instruction with models to review the exercise physiology of the cardiovascular system and associated critical values. Provides general exercise guidelines with specific guidelines to those with heart or lung disease.  Flowsheet Row Pulmonary Rehab from 10/13/2020 in [facility] Cardiac and Pulmonary Rehab  Date 10/13/20  Educator [facility]  Instruction Review Code 1- Verbalizes Understanding      Education: Flexibility, Balance, Mind/Body Relaxation: - Group verbal and visual presentation with interactive activity on the components of exercise prescription. Introduces F.I.T.T principle from ACSM for exercise prescriptions. Reviews F.I.T.T. principles of flexibility and balance exercise training including progression. Also discusses the mind body connection.  Reviews various relaxation techniques to help reduce and manage stress (i.e. Deep breathing, progressive muscle relaxation, and visualization). Balance handout provided to take home. Written material given at graduation. Flowsheet Row Pulmonary Rehab from 05/29/2016 in [facility] Cardiac and Pulmonary Rehab  Date 03/08/16  Educator  Rhett Bannister, PT  Instruction Review Code (retired) 2- meets goals/outcomes      Activity Barriers & Risk Stratification:  Activity Barriers & Cardiac Risk Stratification - 08/11/20 1111      Activity Barriers & Cardiac Risk Stratification   Activity Barriers Shortness of Breath           6 Minute Walk:  6 Minute Walk    Row Name 08/16/20 1606         6 Minute Walk   Phase Initial     Distance 645 feet     Walk Time 5.33 minutes     # of Rest Breaks 3     MPH 1.4     METS 2.82     RPE 13     Perceived Dyspnea  3     VO2 Peak 9.9     Symptoms No     Resting HR 101 bpm     Resting BP 114/64     Resting Oxygen Saturation  96 %     Exercise Oxygen Saturation  during 6 min walk 93 %     Max Ex. HR 115 bpm     Max Ex. BP 188/78     2 Minute Post BP 118/68           Interval HR   1 Minute HR 100     2 Minute HR 110     3 Minute HR 110     4 Minute HR 115     5 Minute HR 109     6 Minute HR 104     2 Minute Post HR 107     Interval Heart Rate? Yes           Interval Oxygen   Interval Oxygen? Yes     Baseline Oxygen Saturation % 96 %     1 Minute Oxygen Saturation % 95 %     1 Minute Liters of Oxygen 3 L     2 Minute Oxygen Saturation % 94 %     2 Minute Liters of Oxygen 3 L     3 Minute Oxygen Saturation % 94 %     3 Minute Liters of Oxygen 3 L     4 Minute Oxygen Saturation % 94 %     4 Minute Liters of Oxygen 3 L     5 Minute Oxygen Saturation %  93 %     5 Minute Liters of Oxygen 3 L     6 Minute Oxygen Saturation % 93 %     6 Minute Liters of Oxygen 3 L     2 Minute Post Oxygen Saturation % 96 %     2 Minute Post Liters of Oxygen 3 L           Oxygen Initial Assessment:  Oxygen Initial Assessment - 08/11/20 1112      Home Oxygen   Home Oxygen Device Home Concentrator;Portable Concentrator    Sleep Oxygen Prescription Continuous    Liters per minute 3    Home Exercise Oxygen Prescription Continuous    Liters per minute 3    Home Resting  Oxygen Prescription Continuous    Liters per minute 3    Compliance with Home Oxygen Use Yes      Intervention   Short Term Goals To learn and exhibit compliance with exercise, home and travel O2 prescription;To learn and understand importance of monitoring SPO2 with pulse oximeter and demonstrate accurate use of the pulse oximeter.;To learn and understand importance of maintaining oxygen saturations>88%;To learn and demonstrate proper pursed lip breathing techniques or other breathing techniques.;To learn and demonstrate proper use of respiratory medications    Long  Term Goals Exhibits compliance with exercise, home and travel O2 prescription;Verbalizes importance of monitoring SPO2 with pulse oximeter and return demonstration;Maintenance of O2 saturations>88%;Compliance with respiratory medication;Demonstrates proper use of MDI's;Exhibits proper breathing techniques, such as pursed lip breathing or other method taught during program session           Oxygen Re-Evaluation:  Oxygen Re-Evaluation    Row Name 09/06/20 1408 09/09/20 1341 10/13/20 1402         Program Oxygen Prescription   Program Oxygen Prescription -- E-Tanks;Continuous E-Tanks;Continuous     Liters per minute -- 3 3           Home Oxygen   Home Oxygen Device Home Concentrator Home Concentrator Home Concentrator     Sleep Oxygen Prescription Continuous Continuous Continuous     Liters per minute 3 3 3      Home Exercise Oxygen Prescription Continuous Pulsed Continuous     Liters per minute 3 3 3      Home Resting Oxygen Prescription Continuous Continuous Continuous     Liters per minute 3 3 3      Compliance with Home Oxygen Use Yes Yes Yes           Goals/Expected Outcomes   Short Term Goals To learn and exhibit compliance with exercise, home and travel O2 prescription;To learn and understand importance of monitoring SPO2 with pulse oximeter and demonstrate accurate use of the pulse oximeter.;To learn and understand  importance of maintaining oxygen saturations>88%;To learn and demonstrate proper pursed lip breathing techniques or other breathing techniques.;To learn and demonstrate proper use of respiratory medications To learn and exhibit compliance with exercise, home and travel O2 prescription;To learn and understand importance of monitoring SPO2 with pulse oximeter and demonstrate accurate use of the pulse oximeter.;To learn and understand importance of maintaining oxygen saturations>88%;To learn and demonstrate proper pursed lip breathing techniques or other breathing techniques.;To learn and demonstrate proper use of respiratory medications To learn and exhibit compliance with exercise, home and travel O2 prescription;To learn and understand importance of monitoring SPO2 with pulse oximeter and demonstrate accurate use of the pulse oximeter.;To learn and understand importance of maintaining oxygen saturations>88%;To learn and demonstrate proper pursed lip breathing techniques  or other breathing techniques.;To learn and demonstrate proper use of respiratory medications     Long  Term Goals Exhibits compliance with exercise, home and travel O2 prescription;Verbalizes importance of monitoring SPO2 with pulse oximeter and return demonstration;Maintenance of O2 saturations>88%;Compliance with respiratory medication;Demonstrates proper use of MDI's;Exhibits proper breathing techniques, such as pursed lip breathing or other method taught during program session Exhibits compliance with exercise, home and travel O2 prescription;Verbalizes importance of monitoring SPO2 with pulse oximeter and return demonstration;Maintenance of O2 saturations>88%;Compliance with respiratory medication;Demonstrates proper use of MDI's;Exhibits proper breathing techniques, such as pursed lip breathing or other method taught during program session Exhibits compliance with exercise, home and travel O2 prescription;Verbalizes importance of  monitoring SPO2 with pulse oximeter and return demonstration;Maintenance of O2 saturations>88%;Compliance with respiratory medication;Demonstrates proper use of MDI's;Exhibits proper breathing techniques, such as pursed lip breathing or other method taught during program session     Comments Reviewed PLB technique with pt.  Talked about how it works and it's importance in maintaining their exercise saturations. Chariah is compliant with her oxygen and using her PLB constantly. She has already noticed a difference in how she is feeling. Jamilex is compliant with her oxygen and using her PLB constantly. Sometimes she will take it off when she is resting to giver herself a break. She reports her breathing is a little bit better with her ADLs.     Goals/Expected Outcomes Short: Become more profiecient at using PLB.   Long: Become independent at using PLB. Short: Continue to use PLB to manage breathing with exercise Long; Continued compliance. Short: Continue to use PLB to manage breathing with exercise Long; Continued compliance.            Oxygen Discharge (Final Oxygen Re-Evaluation):  Oxygen Re-Evaluation - 10/13/20 1402      Program Oxygen Prescription   Program Oxygen Prescription E-Tanks;Continuous    Liters per minute 3      Home Oxygen   Home Oxygen Device Home Concentrator    Sleep Oxygen Prescription Continuous    Liters per minute 3    Home Exercise Oxygen Prescription Continuous    Liters per minute 3    Home Resting Oxygen Prescription Continuous    Liters per minute 3    Compliance with Home Oxygen Use Yes      Goals/Expected Outcomes   Short Term Goals To learn and exhibit compliance with exercise, home and travel O2 prescription;To learn and understand importance of monitoring SPO2 with pulse oximeter and demonstrate accurate use of the pulse oximeter.;To learn and understand importance of maintaining oxygen saturations>88%;To learn and demonstrate proper pursed lip breathing  techniques or other breathing techniques.;To learn and demonstrate proper use of respiratory medications    Long  Term Goals Exhibits compliance with exercise, home and travel O2 prescription;Verbalizes importance of monitoring SPO2 with pulse oximeter and return demonstration;Maintenance of O2 saturations>88%;Compliance with respiratory medication;Demonstrates proper use of MDI's;Exhibits proper breathing techniques, such as pursed lip breathing or other method taught during program session    Comments Nayeliz is compliant with her oxygen and using her PLB constantly. Sometimes she will take it off when she is resting to giver herself a break. She reports her breathing is a little bit better with her ADLs.    Goals/Expected Outcomes Short: Continue to use PLB to manage breathing with exercise Long; Continued compliance.           Initial Exercise Prescription:  Initial Exercise Prescription - 08/16/20 1600  Date of Initial Exercise RX and Referring Provider   Date 08/16/20    Referring Provider Aleskerov      Treadmill   MPH 1    Grade 0    Minutes 15    METs 1.77      NuStep   Level 1    SPM 80    Minutes 15    METs 2      REL-XR   Level 1    Speed 50    Minutes 15    METs 2      Prescription Details   Frequency (times per week) 3    Duration Progress to 30 minutes of continuous aerobic without signs/symptoms of physical distress      Intensity   THRR 40-80% of Max Heartrate 123-145    Ratings of Perceived Exertion 11-15    Perceived Dyspnea 0-4      Resistance Training   Training Prescription Yes    Weight 3 lb    Reps 10-15           Perform Capillary Blood Glucose checks as needed.  Exercise Prescription Changes:  Exercise Prescription Changes    Row Name 08/16/20 1600 09/15/20 1300 09/15/20 1400 09/30/20 1500 10/13/20 1700     Response to Exercise   Blood Pressure (Admit) 114/64 -- 150/60 144/68 152/78   Blood Pressure (Exercise) 188/78 --  162/78 150/74 146/76   Blood Pressure (Exit) 118/68 -- 130/80 148/76 140/78   Heart Rate (Admit) 101 bpm -- 79 bpm 99 bpm 113 bpm   Heart Rate (Exercise) 115 bpm -- 106 bpm 105 bpm 109 bpm   Heart Rate (Exit) 107 bpm -- 109 bpm 94 bpm 98 bpm   Oxygen Saturation (Admit) 96 % -- 98 % 91 % 90 %   Oxygen Saturation (Exercise) 93 % -- 97 % 94 % 90 %   Oxygen Saturation (Exit) 96 % -- 98 % 96 % 96 %   Rating of Perceived Exertion (Exercise) 13 -- 13 13 12    Perceived Dyspnea (Exercise) 3 -- 2.5 1 1    Symptoms none -- none -- --   Duration -- -- Progress to 30 minutes of  aerobic without signs/symptoms of physical distress Continue with 30 min of aerobic exercise without signs/symptoms of physical distress. Continue with 30 min of aerobic exercise without signs/symptoms of physical distress.   Intensity -- -- THRR unchanged THRR unchanged THRR unchanged     Progression   Progression -- -- Continue to progress workloads to maintain intensity without signs/symptoms of physical distress. Continue to progress workloads to maintain intensity without signs/symptoms of physical distress. Continue to progress workloads to maintain intensity without signs/symptoms of physical distress.   Average METs -- -- 2.5 2 2      Resistance Training   Training Prescription -- -- Yes Yes Yes   Weight -- -- 5 lb 5 lb 3 lb   Reps -- -- 10-15 10-15 10-15     Interval Training   Interval Training -- -- -- No --     NuStep   Level -- -- 4 -- 4   SPM -- -- 80 -- 80   Minutes -- -- 15 -- 15   METs -- -- 2.5 -- 2     Biostep-RELP   Level -- -- -- 2 --   SPM -- -- -- 50 --   Minutes -- -- -- 15 --   METs -- -- -- 2 --  Home Exercise Plan   Plans to continue exercise at -- Home (comment)  walking, staff videos -- Home (comment)  walking, staff videos --   Frequency -- Add 1 additional day to program exercise sessions. -- Add 1 additional day to program exercise sessions. --   Initial Home Exercises Provided --  09/15/20 -- 09/15/20 --          Exercise Comments:   Exercise Goals and Review:  Exercise Goals    Row Name 08/16/20 1616             Exercise Goals   Increase Physical Activity Yes       Intervention Provide advice, education, support and counseling about physical activity/exercise needs.;Develop an individualized exercise prescription for aerobic and resistive training based on initial evaluation findings, risk stratification, comorbidities and participant's personal goals.       Expected Outcomes Short Term: Attend rehab on a regular basis to increase amount of physical activity.;Long Term: Add in home exercise to make exercise part of routine and to increase amount of physical activity.;Long Term: Exercising regularly at least 3-5 days a week.       Increase Strength and Stamina Yes       Intervention Provide advice, education, support and counseling about physical activity/exercise needs.;Develop an individualized exercise prescription for aerobic and resistive training based on initial evaluation findings, risk stratification, comorbidities and participant's personal goals.       Expected Outcomes Short Term: Increase workloads from initial exercise prescription for resistance, speed, and METs.;Short Term: Perform resistance training exercises routinely during rehab and add in resistance training at home;Long Term: Improve cardiorespiratory fitness, muscular endurance and strength as measured by increased METs and functional capacity ( )       Able to understand and use rate of perceived exertion (RPE) scale Yes       Intervention Provide education and explanation on how to use RPE scale       Expected Outcomes Short Term: Able to use RPE daily in rehab to express subjective intensity level;Long Term:  Able to use RPE to guide intensity level when exercising independently       Able to understand and use Dyspnea scale Yes       Intervention Provide education and explanation on  how to use Dyspnea scale       Expected Outcomes Short Term: Able to use Dyspnea scale daily in rehab to express subjective sense of shortness of breath during exertion;Long Term: Able to use Dyspnea scale to guide intensity level when exercising independently       Knowledge and understanding of Target Heart Rate Range (THRR) Yes       Intervention Provide education and explanation of THRR including how the numbers were predicted and where they are located for reference       Expected Outcomes Short Term: Able to state/look up THRR;Short Term: Able to use daily as guideline for intensity in rehab;Long Term: Able to use THRR to govern intensity when exercising independently       Able to check pulse independently Yes       Intervention Provide education and demonstration on how to check pulse in carotid and radial arteries.;Review the importance of being able to check your own pulse for safety during independent exercise       Expected Outcomes Short Term: Able to explain why pulse checking is important during independent exercise;Long Term: Able to check pulse independently and accurately  Understanding of Exercise Prescription Yes       Intervention Provide education, explanation, and written materials on patient's individual exercise prescription       Expected Outcomes Short Term: Able to explain program exercise prescription;Long Term: Able to explain home exercise prescription to exercise independently              Exercise Goals Re-Evaluation :  Exercise Goals Re-Evaluation    Row Name 09/06/20 1406 09/09/20 1334 09/15/20 1353 09/30/20 1521 10/13/20 1354     Exercise Goal Re-Evaluation   Exercise Goals Review Increase Physical Activity;Able to understand and use rate of perceived exertion (RPE) scale;Knowledge and understanding of Target Heart Rate Range (THRR);Understanding of Exercise Prescription;Increase Strength and Stamina;Able to understand and use Dyspnea scale;Able to check  pulse independently Increase Physical Activity;Increase Strength and Stamina;Understanding of Exercise Prescription Increase Physical Activity;Increase Strength and Stamina;Understanding of Exercise Prescription;Able to understand and use rate of perceived exertion (RPE) scale;Able to understand and use Dyspnea scale;Knowledge and understanding of Target Heart Rate Range (THRR);Able to check pulse independently Increase Physical Activity;Increase Strength and Stamina Increase Physical Activity;Increase Strength and Stamina   Comments Reviewed RPE and dyspnea scales, THR and program prescription with pt today.  Pt voiced understanding and was given a copy of goals to take home. Nathifa is on her third full day of exercise.  She is on the recumbent elliptical for the first time and finding it tough.  She is already doing some hand weights at home.  We will talk about  home exercise in about a week to add in class regularly first. Reviewed home exercise with pt today.  Pt plans to walk and use weights for exercise.  We also talked about using staff videos for exericse.   Reviewed THR, pulse, RPE, sign and symptoms, pulse oximetery and when to call 911 or MD.  Also discussed weather considerations and indoor options.  Pt voiced understanding. Mandy is doing well with exercise.  Oxygen has been in the 90s during exercise.  We will continue to monitor progress. Amisadai reports doing upper body exercise videos (weighted - 3 lbs) - just started - would like to do it 2x/week. She reports walking a little bit - shereports anxeity gets in her way - especially when she [place]. She got a referral to talk to someone regarding her anxiety.   Expected Outcomes Short: Use RPE daily to regulate intensity. Long: Follow program prescription in THR. Short: Attend regularly Long: Continue to follow program prescrition Short: Start to add in home exericse week after Thanksgiving  Long: COntinue to exericse  independently Short:  attend consistently Long:  increase overall stamina ST: continue to do weights at home and attend rehab, see specialist regarding anxiety to help with anxiety and going to the gym LT: 150 minutes of moderate activity and 2x/week of weights          Discharge Exercise Prescription (Final Exercise Prescription Changes):  Exercise Prescription Changes - 10/13/20 1700      Response to Exercise   Blood Pressure (Admit) 152/78    Blood Pressure (Exercise) 146/76    Blood Pressure (Exit) 140/78    Heart Rate (Admit) 113 bpm    Heart Rate (Exercise) 109 bpm    Heart Rate (Exit) 98 bpm    Oxygen Saturation (Admit) 90 %    Oxygen Saturation (Exercise) 90 %    Oxygen Saturation (Exit) 96 %    Rating of Perceived Exertion (Exercise) 12  Perceived Dyspnea (Exercise) 1    Duration Continue with 30 min of aerobic exercise without signs/symptoms of physical distress.    Intensity THRR unchanged      Progression   Progression Continue to progress workloads to maintain intensity without signs/symptoms of physical distress.    Average METs 2      Resistance Training   Training Prescription Yes    Weight 3 lb    Reps 10-15      NuStep   Level 4    SPM 80    Minutes 15    METs 2           Nutrition:  Target Goals: Understanding of nutrition guidelines, daily intake of sodium 1500mg , cholesterol 200mg , calories 30% from fat and 7% or less from saturated fats, daily to have 5 or more servings of fruits and vegetables.  Education: All About Nutrition: -Group instruction provided by verbal, written material, interactive activities, discussions, models, and posters to present general guidelines for heart healthy nutrition including fat, fiber, MyPlate, the role of sodium in heart healthy nutrition, utilization of the nutrition label, and utilization of this knowledge for meal planning. Follow up email sent as well. Written material given at graduation. Flowsheet Row  Pulmonary Rehab from 10/13/2020 in [facility] Cardiac and Pulmonary Rehab  Date 09/08/20  Educator [facility]  Instruction Review Code 1- Verbalizes Understanding      Biometrics:  Pre Biometrics - 08/16/20 1616      Pre Biometrics   Height 5\' 5"  (1.651 m)    Weight 149 lb 4.8 oz (67.7 kg)    BMI (Calculated) 24.84    Single Leg Stand 7.98 seconds            Nutrition Therapy Plan and Nutrition Goals:  Nutrition Therapy & Goals - 09/13/20 1358      Nutrition Therapy   Diet Heart healty, low Na, pulmonary MNT    Protein (specify units) 80g    Fiber 25 grams    Whole Grain Foods 3 servings    Saturated Fats 12 max. grams    Fruits and Vegetables 8 servings/day    Sodium 1.5 grams      Personal Nutrition Goals   Nutrition Goal ST: Add protein to breakfast - boiled egg or nut butter and to lunch - peanut butter, choose low sodium or no salt added canned food items LT: lose weight (she would like to lose her stomach), eat 8 fruits/vegetables per day, eat a rainbow of fruits and vegetables every week, meet protein needs    Comments She reports eating smaller portions. Coffee: 1 sweet and low and some creamer. B: cereal or bagel with cream cheese or bacon and eggs with grits or frozen waffles Snack: piece of fruit or nabs with soft drink (regular) D: meat with two vegetables. Meat: ground beef  to make meatloaf, chicken, seafood, pork. Red meat 2x/week). She reports liking all vegetables. Potatoes and corn. She likes peas, black eyed peas, navy beans, turnip greens. She will typically have nonstarchy and starchy vegetables. She will use some olive oil and not much grease. She will usually bake or airfry. S: ice cream sandwich, recently have been getting frozen real fruit popsicles. Discussed heart healthy eating and pulmonary MNT.      Intervention Plan   Intervention Prescribe, educate and counsel regarding individualized specific dietary modifications aiming towards targeted core components such  as weight, hypertension, lipid management, diabetes, heart failure and other comorbidities.;Nutrition handout(s) given to patient.  Expected Outcomes Short Term Goal: Understand basic principles of dietary content, such as calories, fat, sodium, cholesterol and nutrients.;Short Term Goal: A plan has been developed with personal nutrition goals set during dietitian appointment.;Long Term Goal: Adherence to prescribed nutrition plan.           Nutrition Assessments:  Nutrition Assessments - 08/16/20 1619      MEDFICTS Scores   Pre Score 35          MEDIFICTS Score Key:  ?70 Need to make dietary changes   40-70 Heart Healthy Diet  ? 40 Therapeutic Level Cholesterol Diet   Picture Your Plate Scores:  <16<40 Unhealthy dietary pattern with much room for improvement.  41-50 Dietary pattern unlikely to meet recommendations for good health and room for improvement.  51-60 More healthful dietary pattern, with some room for improvement.   >60 Healthy dietary pattern, although there may be some specific behaviors that could be improved.   Nutrition Goals Re-Evaluation:  Nutrition Goals Re-Evaluation    Row Name 09/09/20 1336 10/13/20 1348           Goals   Nutrition Goal set up meeting with Melissa ST: Add protein to breakfast - boiled egg or nut butter and to lunch - peanut butter, choose low sodium or no salt added canned food items LT: lose weight (she would like to lose her stomach), eat 8 fruits/vegetables per day, eat a rainbow of fruits and vegetables every week, meet protein needs      Comment [name] will schedule an appointment with Melissa for her diet. Iona reports draining canned foods, but not rinsing all of them. Suggested getting low sodium canned items and rinsing them off. She reports not eating much protein still and has sugary yogurt. Suggested eggs, peanut butter, greek yogurt as additions to breakfast.      Expected Outcome Meet with dietitian ST: Add protein  to breakfast - boiled egg or nut butter and to lunch - peanut butter, choose low sodium or no salt added canned food items LT: lose weight (she would like to lose her stomach), eat 8 fruits/vegetables per day, eat a rainbow of fruits and vegetables every week, meet protein needs             Nutrition Goals Discharge (Final Nutrition Goals Re-Evaluation):  Nutrition Goals Re-Evaluation - 10/13/20 1348      Goals   Nutrition Goal ST: Add protein to breakfast - boiled egg or nut butter and to lunch - peanut butter, choose low sodium or no salt added canned food items LT: lose weight (she would like to lose her stomach), eat 8 fruits/vegetables per day, eat a rainbow of fruits and vegetables every week, meet protein needs    Comment Daine reports draining canned foods, but not rinsing all of them. Suggested getting low sodium canned items and rinsing them off. She reports not eating much protein still and has sugary yogurt. Suggested eggs, peanut butter, greek yogurt as additions to breakfast.    Expected Outcome ST: Add protein to breakfast - boiled egg or nut butter and to lunch - peanut butter, choose low sodium or no salt added canned food items LT: lose weight (she would like to lose her stomach), eat 8 fruits/vegetables per day, eat a rainbow of fruits and vegetables every week, meet protein needs           Psychosocial: Target Goals: Acknowledge presence or absence of significant depression and/or stress, maximize coping skills, provide positive support  system. Participant is able to verbalize types and ability to use techniques and skills needed for reducing stress and depression.   Education: Stress, Anxiety, and Depression - Group verbal and visual presentation to define topics covered.  Reviews how body is impacted by stress, anxiety, and depression.  Also discusses healthy ways to reduce stress and to treat/manage anxiety and depression.  Written material given at  graduation. Flowsheet Row Pulmonary Rehab from 05/29/2016 in [facility] Cardiac and Pulmonary Rehab  Date 04/12/16  Educator Jeannetta Ellis, [facility]  Instruction Review Code (retired) 2- meets goals/outcomes      Education: Sleep Hygiene -Provides group verbal and written instruction about how sleep can affect your health.  Define sleep hygiene, discuss sleep cycles and impact of sleep habits. Review good sleep hygiene tips.    Initial Review & Psychosocial Screening:  Initial Psych Review & Screening - 08/11/20 1114      Initial Review   Current issues with Current Depression;Current Psychotropic Meds;Current Stress Concerns    Source of Stress Concerns Unable to participate in former interests or hobbies;Unable to perform yard/household activities;Chronic Illness    Comments Chronic illness causing not able to do things she would like to do.      Family Dynamics   Good Support System? Yes   Partner 7 years together in home, daughter not far about 10 min down the road.     Barriers   Psychosocial barriers to participate in program There are no identifiable barriers or psychosocial needs.;The patient should benefit from training in stress management and relaxation.      Screening Interventions   Interventions Encouraged to exercise;Provide feedback about the scores to participant;To provide support and resources with identified psychosocial needs    Expected Outcomes Short Term goal: Utilizing psychosocial counselor, staff and physician to assist with identification of specific Stressors or current issues interfering with healing process. Setting desired goal for each stressor or current issue identified.;Long Term Goal: Stressors or current issues are controlled or eliminated.;Short Term goal: Identification and review with participant of any Quality of Life or Depression concerns found by scoring the questionnaire.;Long Term goal: The participant improves quality of Life and PHQ9 Scores as seen by  post scores and/or verbalization of changes           Quality of Life Scores:  Scores of 19 and below usually indicate a poorer quality of life in these areas.  A difference of  2-3 points is a clinically meaningful difference.  A difference of 2-3 points in the total score of the Quality of Life Index has been associated with significant improvement in overall quality of life, self-image, physical symptoms, and general health in studies assessing change in quality of life.  PHQ-9: Recent Review Flowsheet Data    Depression screen [facility] 2/9 08/16/2020 05/26/2016 02/15/2016   Decreased Interest 1 0 0   Down, Depressed, Hopeless 1 1 0   PHQ - 2 Score 2 1 0   Altered sleeping 1 3 1    Tired, decreased energy 1 1 1    Change in appetite 0 0 1   Feeling bad or failure about yourself  0 0 0   Trouble concentrating 0 0 0   Moving slowly or fidgety/restless 0 0 0   Suicidal thoughts 0 0 0   PHQ-9 Score 4 5 3    Difficult doing work/chores Somewhat difficult Somewhat difficult Not difficult at all     Interpretation of Total Score  Total Score Depression Severity:  1-4 =  Minimal depression, 5-9 = Mild depression, 10-14 = Moderate depression, 15-19 = Moderately severe depression, 20-27 = Severe depression   Psychosocial Evaluation and Intervention:  Psychosocial Evaluation - 08/11/20 1127      Psychosocial Evaluation & Interventions   Comments Kelina has no barriers to entering the program. She has attended PR in the past and remembers how it helped her have more energy to manage her daily ADLs and chores.  She does hae stress over the chronic illness and her inablility to manage her daily activities and activities she would like to participate in daily. She is looking forward to exercising and feeling better.  She lives at home with her partner of 7 years. Her daughter lives very close by and is part of her support system. Her goal is to be able to move around more than she is right now. She is  ready to start and work towards that goal.    Expected Outcomes STG: Natalya is able to attend all scheduled sessions.  LTG: Ashyra benefits from the prgram in being able to manage more daily activiyies and decreasing her stress of not having energy to do what she likes.    Continue Psychosocial Services  Follow up required by staff           Psychosocial Re-Evaluation:  Psychosocial Re-Evaluation    Row Name 09/09/20 1336 10/13/20 1403           Psychosocial Re-Evaluation   Current issues with Current Stress Concerns;Current Anxiety/Panic Current Anxiety/Panic;Current Stress Concerns      Comments Novaleigh is just getting started in rehab. She noticed her anxiety was really high her first day, but can already tell a difference in it calming down.  She knows that exercise is going to help her manage it better.  She still has a hard time sleeping at night.  She usually averages abour 4-5 hours a night.  She is not sure what keeps waking her at night. She has had a sleep study before.  She does have a fan that helps her sleep too. She reports having some stress concerns - she reports having stress regarding her partner (arguments - "he is never wrong"). She has a referral for a psychiatrist in regards to her anxiety and panic. She has anxiety going into stores and the gym to exercise. She is sleeping better 6 hours a night now - anxiety was waking her up at night previously.      Expected Outcomes Short: Continue to exercise to help with anxiety  Long: Continue to work on sleep Short: Continue to exercise to help with anxiety, see psychiatrist  Long: Continue to work on sleep and anxiety management      Interventions Encouraged to attend Pulmonary Rehabilitation for the exercise;Stress management education Encouraged to attend Pulmonary Rehabilitation for the exercise;Stress management education      Continue Psychosocial Services  Follow up required by staff --      Comments Chronic illness  causing not able to do things she would like to do. She reports feeling better about her chronic illness.             Initial Review   Source of Stress Concerns Unable to participate in former interests or hobbies;Unable to perform yard/household activities;Chronic Illness Family             Psychosocial Discharge (Final Psychosocial Re-Evaluation):  Psychosocial Re-Evaluation - 10/13/20 1403      Psychosocial Re-Evaluation   Current issues with Current  Anxiety/Panic;Current Stress Concerns    Comments She reports having some stress concerns - she reports having stress regarding her partner (arguments - "he is never wrong"). She has a referral for a psychiatrist in regards to her anxiety and panic. She has anxiety going into stores and the gym to exercise. She is sleeping better 6 hours a night now - anxiety was waking her up at night previously.    Expected Outcomes Short: Continue to exercise to help with anxiety, see psychiatrist  Long: Continue to work on sleep and anxiety management    Interventions Encouraged to attend Pulmonary Rehabilitation for the exercise;Stress management education    Comments She reports feeling better about her chronic illness.      Initial Review   Source of Stress Concerns Family           Education: Education Goals: Education classes will be provided on a weekly basis, covering required topics. Participant will state understanding/return demonstration of topics presented.  Learning Barriers/Preferences:   General Pulmonary Education Topics:  Infection Prevention: - Provides verbal and written material to individual with discussion of infection control including proper hand washing and proper equipment cleaning during exercise session. Flowsheet Row Pulmonary Rehab from 10/13/2020 in [facility] Cardiac and Pulmonary Rehab  Date 08/16/20  Educator AS  Instruction Review Code 1- Verbalizes Understanding      Falls Prevention: - Provides verbal and  written material to individual with discussion of falls prevention and safety. Flowsheet Row Pulmonary Rehab from 10/13/2020 in [facility] Cardiac and Pulmonary Rehab  Date 08/16/20  Educator As  Instruction Review Code 1- Verbalizes Understanding      Chronic Lung Disease Review: - Group verbal instruction with posters, models, PowerPoint presentations and videos,  to review new updates, new respiratory medications, new advancements in procedures and treatments. Providing information on websites and "800" numbers for continued self-education. Includes information about supplement oxygen, available portable oxygen systems, continuous and intermittent flow rates, oxygen safety, concentrators, and Medicare reimbursement for oxygen. Explanation of Pulmonary Drugs, including class, frequency, complications, importance of spacers, rinsing mouth after steroid MDI's, and proper cleaning methods for nebulizers. Review of basic lung anatomy and physiology related to function, structure, and complications of lung disease. Review of risk factors. Discussion about methods for diagnosing sleep apnea and types of masks and machines for OSA. Includes a review of the use of types of environmental controls: home humidity, furnaces, filters, dust mite/pet prevention, HEPA vacuums. Discussion about weather changes, air quality and the benefits of nasal washing. Instruction on Warning signs, infection symptoms, calling MD promptly, preventive modes, and value of vaccinations. Review of effective airway clearance, coughing and/or vibration techniques. Emphasizing that all should Create an Action Plan. Written material given at graduation. Flowsheet Row Pulmonary Rehab from 10/13/2020 in [facility] Cardiac and Pulmonary Rehab  Date 09/29/20  Educator [facility]  Instruction Review Code 1- Verbalizes Understanding      AED/CPR: - Group verbal and written instruction with the use of models to demonstrate the basic use of the AED with the  basic ABC's of resuscitation.    Anatomy and Cardiac Procedures: - Group verbal and visual presentation and models provide information about basic cardiac anatomy and function. Reviews the testing methods done to diagnose heart disease and the outcomes of the test results. Describes the treatment choices: Medical Management, Angioplasty, or Coronary Bypass Surgery for treating various heart conditions including Myocardial Infarction, Angina, Valve Disease, and Cardiac Arrhythmias.  Written material given at graduation.   Medication Safety: -  Group verbal and visual instruction to review commonly prescribed medications for heart and lung disease. Reviews the medication, class of the drug, and side effects. Includes the steps to properly store meds and maintain the prescription regimen.  Written material given at graduation. Flowsheet Row Pulmonary Rehab from 10/13/2020 in [facility] Cardiac and Pulmonary Rehab  Date 09/15/20  Educator [facility]  Instruction Review Code 1- Verbalizes Understanding      Other: -Provides group and verbal instruction on various topics (see comments)   Knowledge Questionnaire Score:  Knowledge Questionnaire Score - 08/16/20 1619      Knowledge Questionnaire Score   Pre Score 16/18 oxygen            Core Components/Risk Factors/Patient Goals at Admission:  Personal Goals and Risk Factors at Admission - 08/16/20 1621      Core Components/Risk Factors/Patient Goals on Admission    Weight Management Yes    Intervention Weight Management: Develop a combined nutrition and exercise program designed to reach desired caloric intake, while maintaining appropriate intake of nutrient and fiber, sodium and fats, and appropriate energy expenditure required for the weight goal.;Weight Management/Obesity: Establish reasonable short term and long term weight goals.;Weight Management: Provide education and appropriate resources to help participant work on and attain dietary  goals.;Obesity: Provide education and appropriate resources to help participant work on and attain dietary goals.    Admit Weight 149 lb 4.8 oz (67.7 kg)    Goal Weight: Short Term 145 lb (65.8 kg)    Goal Weight: Long Term 130 lb (59 kg)    Expected Outcomes Short Term: Continue to assess and modify interventions until short term weight is achieved;Long Term: Adherence to nutrition and physical activity/exercise program aimed toward attainment of established weight goal;Weight Loss: Understanding of general recommendations for a balanced deficit meal plan, which promotes 1-2 lb weight loss per week and includes a negative energy balance of [phone] kcal/d    Improve shortness of breath with ADL's Yes           Education:Diabetes - Individual verbal and written instruction to review signs/symptoms of diabetes, desired ranges of glucose level fasting, after meals and with exercise. Acknowledge that pre and post exercise glucose checks will be done for 3 sessions at entry of program.   Know Your Numbers and Heart Failure: - Group verbal and visual instruction to discuss disease risk factors for cardiac and pulmonary disease and treatment options.  Reviews associated critical values for Overweight/Obesity, Hypertension, Cholesterol, and Diabetes.  Discusses basics of heart failure: signs/symptoms and treatments.  Introduces Heart Failure Zone chart for action plan for heart failure.  Written material given at graduation.   Core Components/Risk Factors/Patient Goals Review:   Goals and Risk Factor Review    Row Name 09/09/20 1339 10/13/20 1412           Core Components/Risk Factors/Patient Goals Review   Personal Goals Review Weight Management/Obesity;Improve shortness of breath with ADL's Weight Management/Obesity;Improve shortness of breath with ADL's      Review Syenna is off to a good start in rehab.  Her weight has been holding steady. She is trying to work on her breathing and can  already tell exercise has helped with her anxiety. She will continue to use her PLB. Kiriana reports her weight has been stable. She continues to work on her breathing and feels it is getting better with her ADLs. she continues to exercise and practice PLB.      Expected Outcomes Short: Continue to  work on weight loss Long; Continue to improve breathing. Short: Continue to come to rehab and workout on her own Long; Continue to improve breathing.             Core Components/Risk Factors/Patient Goals at Discharge (Final Review):   Goals and Risk Factor Review - 10/13/20 1412      Core Components/Risk Factors/Patient Goals Review   Personal Goals Review Weight Management/Obesity;Improve shortness of breath with ADL's    Review Farran reports her weight has been stable. She continues to work on her breathing and feels it is getting better with her ADLs. she continues to exercise and practice PLB.    Expected Outcomes Short: Continue to come to rehab and workout on her own Long; Continue to improve breathing.           ITP Comments:  ITP Comments    Row Name 08/11/20 1134 08/16/20 1624 09/01/20 0616 09/06/20 1406 09/29/20 0915   ITP Comments Virtual orientation call completed today. shehas an appointment on Date: 08/16/2020 for EP eval and gym Orientation.  Documentation of diagnosis can be found in [facility]  Date: [id number]. Completed and gym orientation. Initial ITP created and sent for review to Dr. Bethann Punches, Medical Director. 30 Day review completed. Medical Director ITP review done, changes made as directed, and signed approval by Medical Director. First full day of exercise!  Patient was oriented to gym and equipment including functions, settings, policies, and procedures.  Patient's individual exercise prescription and treatment plan were reviewed.  All starting workloads were established based on the results of the 6 minute walk test done at initial orientation visit.  The plan for  exercise progression was also introduced and progression will be customized based on patient's performance and goals. 30 Day review completed. Medical Director ITP review done, changes made as directed, and signed approval by Medical Director.   Row Name 10/27/20 0532 10/27/20 1559 11/10/20 1116 11/24/20 0856     ITP Comments 30 Day review completed. Medical Director ITP review done, changes made as directed, and signed approval by Medical Director.   only 3 visits since last review Keyara has not attended since last review. Aleatha has been in the hospital and hasnt been in since last review 30 Day review completed. Medical Director ITP review done, changes made as directed, and signed approval by Medical Director.           Comments:

## 2020-11-24 NOTE — Telephone Encounter (Signed)
LMOM

## 2020-12-01 ENCOUNTER — Encounter: Payer: Medicare Other | Attending: Pulmonary Disease

## 2020-12-01 DIAGNOSIS — J449 Chronic obstructive pulmonary disease, unspecified: Secondary | ICD-10-CM | POA: Insufficient documentation

## 2020-12-06 ENCOUNTER — Telehealth: Payer: Self-pay

## 2020-12-06 NOTE — Telephone Encounter (Signed)
Left message as we have not seen her at rehab since 10/13/20, have not heard from her since 11/01/19. Will send letter.

## 2020-12-07 ENCOUNTER — Telehealth: Payer: Self-pay | Admitting: *Deleted

## 2020-12-07 ENCOUNTER — Encounter: Payer: Self-pay | Admitting: *Deleted

## 2020-12-07 DIAGNOSIS — J449 Chronic obstructive pulmonary disease, unspecified: Secondary | ICD-10-CM

## 2020-12-07 NOTE — Progress Notes (Signed)
Pulmonary Individual Treatment Plan  Patient Details  Name: Heidi [name] MRN: [id number] Date of Birth: [birth year] Referring Provider:   Flowsheet Row Pulmonary Rehab from 08/16/2020 in [facility] Cardiac and Pulmonary Rehab  Referring Provider Aleskerov      Initial Encounter Date:  Flowsheet Row Pulmonary Rehab from 08/16/2020 in [facility] Cardiac and Pulmonary Rehab  Date 08/16/20      Visit Diagnosis: Chronic obstructive pulmonary disease, unspecified COPD type (HCC)  Patient's Home Medications on Admission:  Current Outpatient Medications:  .  albuterol (PROVENTIL HFA;VENTOLIN HFA) 108 (90 Base) MCG/ACT inhaler, Inhale 1-2 puffs into the lungs every 4 (four) hours as needed for wheezing or shortness of breath., Disp: , Rfl:  .  albuterol (PROVENTIL) (2.5 MG/3ML) 0.083% nebulizer solution, Take 3 mLs (2.5 mg total) by nebulization every 6 (six) hours as needed for wheezing or shortness of breath., Disp: 360 mL, Rfl: 5 .  ALPRAZolam (XANAX) 0.25 MG tablet, Take 0.25 mg by mouth 2 (two) times daily., Disp: , Rfl:  .  azithromycin (ZITHROMAX) 250 MG tablet, Take 500 mg daily for 2 days more, Disp: 4 tablet, Rfl: 0 .  escitalopram (LEXAPRO) 20 MG tablet, Take 20 mg by mouth daily., Disp: , Rfl:  .  fluticasone (FLONASE) 50 MCG/ACT nasal spray, USE 2 SPRAYS IN EACH NOSTRIL ONCE DAILY, Disp: 16 g, Rfl: 5 .  Fluticasone-Umeclidin-Vilant (TRELEGY ELLIPTA) 100-62.5-25 MCG/INH AEPB, Inhale 1 puff into the lungs daily. Pt needs appt for further refills., Disp: 180 each, Rfl: 0 .  guaiFENesin-codeine 100-10 MG/5ML syrup, Take 5 mLs by mouth every 6 (six) hours as needed for cough., Disp: 118 mL, Rfl: 0 .  guaiFENesin-dextromethorphan (ROBITUSSIN DM) 100-10 MG/5ML syrup, Take 5 mLs by mouth every 4 (four) hours as needed for cough., Disp: 118 mL, Rfl: 0 .  Ipratropium-Albuterol (COMBIVENT RESPIMAT) 20-100 MCG/ACT AERS respimat, INHALE 1 PUFF BY MOUTH EVERY 6 HOURS AS NEEDED FOR WHEEZING, Disp: 4 g,  Rfl: 5 .  ipratropium-albuterol (DUONEB) 0.5-2.5 (3) MG/3ML SOLN, Take 3 mLs by nebulization every 4 (four) hours as needed., Disp: 360 mL, Rfl: 2 .  mirtazapine (REMERON) 30 MG tablet, Take 30 mg by mouth at bedtime., Disp: , Rfl:  .  montelukast (SINGULAIR) 10 MG tablet, Take 10 mg by mouth at bedtime., Disp: , Rfl:  .  predniSONE (DELTASONE) 20 MG tablet, Take 2 tablets (40 mg total) by mouth daily with breakfast., Disp: 8 tablet, Rfl: 0  Past Medical History: Past Medical History:  Diagnosis Date  . Anxiety   . COPD (chronic obstructive pulmonary disease) (HCC)   . GERD (gastroesophageal reflux disease)   . Hypotension    limiting med titration  . NSTEMI (non-ST elevated myocardial infarction) [facility]) 2013   12/2011 with normal coronaries possibly secondary to Takotsubo (EF 30-35% by echo), occurred following two episodes of acute respiratory distress  . Pneumonia 11/2016   history of  . Takotsubo syndrome 4/13    Tobacco Use: Social History   Tobacco Use  Smoking Status Former Smoker  . Packs/day: 1.00  . Years: 30.00  . Pack years: 30.00  . Types: Cigarettes  . Quit date: 01/21/2012  . Years since quitting: 8.8  Smokeless Tobacco Never Used    Labs: Recent Review Flowsheet Data    Labs for ITP Cardiac and Pulmonary Rehab Latest Ref Rng & Units 08/08/2012 04/04/2019 07/09/2020 07/09/2020 11/05/2020   Cholestrol 0 - 200 mg/dL - - - - -   LDLCALC 0 - 99 mg/dL - - - - -  HDL >39 mg/dL - - - - -   Trlycerides <150 mg/dL - - - - -   Hemoglobin A1c 4.8 - 5.6 % - - - - 5.8(H)   PHART - 7.332(L) 7.46 - - -   PCO2ART mmHg 47.3(H) 41 - - -   HCO3 20.0 - 28.0 mmol/L 25.1(H) 29.2 31.9(H) 31.6(H) -   TCO2 0 - 100 mmol/L 27 - - - -   ACIDBASEDEF 0.0 - 2.0 mmol/L 1.0 - - - -   O2SAT % 99.0 97.5 94.0 92.4 -       Pulmonary Assessment Scores:  Pulmonary Assessment Scores    Row Name 08/16/20 1618         ADL UCSD   ADL Phase Entry     SOB Score total 65     Rest 1     Walk 2      Stairs 5     Bath 1     Dress 1     Shop 2           CAT Score   CAT Score 29           mMRC Score   mMRC Score 3            UCSD: Self-administered rating of dyspnea associated with activities of daily living (ADLs) 6-point scale (0 = "not at all" to 5 = "maximal or unable to do because of breathlessness")  Scoring Scores range from 0 to 120.  Minimally important difference is 5 units  CAT: CAT can identify the health impairment of COPD patients and is better correlated with disease progression.  CAT has a scoring range of zero to 40. The CAT score is classified into four groups of low (less than 10), medium (10 - 20), high (21-30) and very high (31-40) based on the impact level of disease on health status. A CAT score over 10 suggests significant symptoms.  A worsening CAT score could be explained by an exacerbation, poor medication adherence, poor inhaler technique, or progression of COPD or comorbid conditions.  CAT MCID is 2 points  mMRC: mMRC (Modified Medical Research Council) Dyspnea Scale is used to assess the degree of baseline functional disability in patients of respiratory disease due to dyspnea. No minimal important difference is established. A decrease in score of 1 point or greater is considered a positive change.   Pulmonary Function Assessment:   Exercise Target Goals: Exercise Program Goal: Individual exercise prescription set using results from initial 6 min walk test and THRR while considering  patient's activity barriers and safety.   Exercise Prescription Goal: Initial exercise prescription builds to 30-45 minutes a day of aerobic activity, 2-3 days per week.  Home exercise guidelines will be given to patient during program as part of exercise prescription that the participant will acknowledge.  Education: Aerobic Exercise: - Group verbal and visual presentation on the components of exercise prescription. Introduces F.I.T.T principle from ACSM for  exercise prescriptions.  Reviews F.I.T.T. principles of aerobic exercise including progression. Written material given at graduation.   Education: Resistance Exercise: - Group verbal and visual presentation on the components of exercise prescription. Introduces F.I.T.T principle from ACSM for exercise prescriptions  Reviews F.I.T.T. principles of resistance exercise including progression. Written material given at graduation.    Education: Exercise & Equipment Safety: - Individual verbal instruction and demonstration of equipment use and safety with use of the equipment. Flowsheet Row Pulmonary Rehab from 10/13/2020 in [facility] Cardiac and Pulmonary  Rehab  Date 08/16/20  Educator AS  Instruction Review Code 1- Verbalizes Understanding      Education: Exercise Physiology & General Exercise Guidelines: - Group verbal and written instruction with models to review the exercise physiology of the cardiovascular system and associated critical values. Provides general exercise guidelines with specific guidelines to those with heart or lung disease.  Flowsheet Row Pulmonary Rehab from 10/13/2020 in [facility] Cardiac and Pulmonary Rehab  Date 10/13/20  Educator [facility]  Instruction Review Code 1- Verbalizes Understanding      Education: Flexibility, Balance, Mind/Body Relaxation: - Group verbal and visual presentation with interactive activity on the components of exercise prescription. Introduces F.I.T.T principle from ACSM for exercise prescriptions. Reviews F.I.T.T. principles of flexibility and balance exercise training including progression. Also discusses the mind body connection.  Reviews various relaxation techniques to help reduce and manage stress (i.e. Deep breathing, progressive muscle relaxation, and visualization). Balance handout provided to take home. Written material given at graduation. Flowsheet Row Pulmonary Rehab from 05/29/2016 in [facility] Cardiac and Pulmonary Rehab  Date 03/08/16  Educator  Rhett Bannister, PT  Instruction Review Code (retired) 2- meets goals/outcomes      Activity Barriers & Risk Stratification:  Activity Barriers & Cardiac Risk Stratification - 08/11/20 1111      Activity Barriers & Cardiac Risk Stratification   Activity Barriers Shortness of Breath           6 Minute Walk:  6 Minute Walk    Row Name 08/16/20 1606         6 Minute Walk   Phase Initial     Distance 645 feet     Walk Time 5.33 minutes     # of Rest Breaks 3     MPH 1.4     METS 2.82     RPE 13     Perceived Dyspnea  3     VO2 Peak 9.9     Symptoms No     Resting HR 101 bpm     Resting BP 114/64     Resting Oxygen Saturation  96 %     Exercise Oxygen Saturation  during 6 min walk 93 %     Max Ex. HR 115 bpm     Max Ex. BP 188/78     2 Minute Post BP 118/68           Interval HR   1 Minute HR 100     2 Minute HR 110     3 Minute HR 110     4 Minute HR 115     5 Minute HR 109     6 Minute HR 104     2 Minute Post HR 107     Interval Heart Rate? Yes           Interval Oxygen   Interval Oxygen? Yes     Baseline Oxygen Saturation % 96 %     1 Minute Oxygen Saturation % 95 %     1 Minute Liters of Oxygen 3 L     2 Minute Oxygen Saturation % 94 %     2 Minute Liters of Oxygen 3 L     3 Minute Oxygen Saturation % 94 %     3 Minute Liters of Oxygen 3 L     4 Minute Oxygen Saturation % 94 %     4 Minute Liters of Oxygen 3 L     5 Minute Oxygen Saturation %  93 %     5 Minute Liters of Oxygen 3 L     6 Minute Oxygen Saturation % 93 %     6 Minute Liters of Oxygen 3 L     2 Minute Post Oxygen Saturation % 96 %     2 Minute Post Liters of Oxygen 3 L           Oxygen Initial Assessment:  Oxygen Initial Assessment - 08/11/20 1112      Home Oxygen   Home Oxygen Device Home Concentrator;Portable Concentrator    Sleep Oxygen Prescription Continuous    Liters per minute 3    Home Exercise Oxygen Prescription Continuous    Liters per minute 3    Home Resting  Oxygen Prescription Continuous    Liters per minute 3    Compliance with Home Oxygen Use Yes      Intervention   Short Term Goals To learn and exhibit compliance with exercise, home and travel O2 prescription;To learn and understand importance of monitoring SPO2 with pulse oximeter and demonstrate accurate use of the pulse oximeter.;To learn and understand importance of maintaining oxygen saturations>88%;To learn and demonstrate proper pursed lip breathing techniques or other breathing techniques.;To learn and demonstrate proper use of respiratory medications    Long  Term Goals Exhibits compliance with exercise, home and travel O2 prescription;Verbalizes importance of monitoring SPO2 with pulse oximeter and return demonstration;Maintenance of O2 saturations>88%;Compliance with respiratory medication;Demonstrates proper use of MDI's;Exhibits proper breathing techniques, such as pursed lip breathing or other method taught during program session           Oxygen Re-Evaluation:  Oxygen Re-Evaluation    Row Name 09/06/20 1408 09/09/20 1341 10/13/20 1402         Program Oxygen Prescription   Program Oxygen Prescription -- E-Tanks;Continuous E-Tanks;Continuous     Liters per minute -- 3 3           Home Oxygen   Home Oxygen Device Home Concentrator Home Concentrator Home Concentrator     Sleep Oxygen Prescription Continuous Continuous Continuous     Liters per minute 3 3 3      Home Exercise Oxygen Prescription Continuous Pulsed Continuous     Liters per minute 3 3 3      Home Resting Oxygen Prescription Continuous Continuous Continuous     Liters per minute 3 3 3      Compliance with Home Oxygen Use Yes Yes Yes           Goals/Expected Outcomes   Short Term Goals To learn and exhibit compliance with exercise, home and travel O2 prescription;To learn and understand importance of monitoring SPO2 with pulse oximeter and demonstrate accurate use of the pulse oximeter.;To learn and understand  importance of maintaining oxygen saturations>88%;To learn and demonstrate proper pursed lip breathing techniques or other breathing techniques.;To learn and demonstrate proper use of respiratory medications To learn and exhibit compliance with exercise, home and travel O2 prescription;To learn and understand importance of monitoring SPO2 with pulse oximeter and demonstrate accurate use of the pulse oximeter.;To learn and understand importance of maintaining oxygen saturations>88%;To learn and demonstrate proper pursed lip breathing techniques or other breathing techniques.;To learn and demonstrate proper use of respiratory medications To learn and exhibit compliance with exercise, home and travel O2 prescription;To learn and understand importance of monitoring SPO2 with pulse oximeter and demonstrate accurate use of the pulse oximeter.;To learn and understand importance of maintaining oxygen saturations>88%;To learn and demonstrate proper pursed lip breathing techniques  or other breathing techniques.;To learn and demonstrate proper use of respiratory medications     Long  Term Goals Exhibits compliance with exercise, home and travel O2 prescription;Verbalizes importance of monitoring SPO2 with pulse oximeter and return demonstration;Maintenance of O2 saturations>88%;Compliance with respiratory medication;Demonstrates proper use of MDI's;Exhibits proper breathing techniques, such as pursed lip breathing or other method taught during program session Exhibits compliance with exercise, home and travel O2 prescription;Verbalizes importance of monitoring SPO2 with pulse oximeter and return demonstration;Maintenance of O2 saturations>88%;Compliance with respiratory medication;Demonstrates proper use of MDI's;Exhibits proper breathing techniques, such as pursed lip breathing or other method taught during program session Exhibits compliance with exercise, home and travel O2 prescription;Verbalizes importance of  monitoring SPO2 with pulse oximeter and return demonstration;Maintenance of O2 saturations>88%;Compliance with respiratory medication;Demonstrates proper use of MDI's;Exhibits proper breathing techniques, such as pursed lip breathing or other method taught during program session     Comments Reviewed PLB technique with pt.  Talked about how it works and it's importance in maintaining their exercise saturations. Amiree is compliant with her oxygen and using her PLB constantly. She has already noticed a difference in how she is feeling. Shabana is compliant with her oxygen and using her PLB constantly. Sometimes she will take it off when she is resting to giver herself a break. She reports her breathing is a little bit better with her ADLs.     Goals/Expected Outcomes Short: Become more profiecient at using PLB.   Long: Become independent at using PLB. Short: Continue to use PLB to manage breathing with exercise Long; Continued compliance. Short: Continue to use PLB to manage breathing with exercise Long; Continued compliance.            Oxygen Discharge (Final Oxygen Re-Evaluation):  Oxygen Re-Evaluation - 10/13/20 1402      Program Oxygen Prescription   Program Oxygen Prescription E-Tanks;Continuous    Liters per minute 3      Home Oxygen   Home Oxygen Device Home Concentrator    Sleep Oxygen Prescription Continuous    Liters per minute 3    Home Exercise Oxygen Prescription Continuous    Liters per minute 3    Home Resting Oxygen Prescription Continuous    Liters per minute 3    Compliance with Home Oxygen Use Yes      Goals/Expected Outcomes   Short Term Goals To learn and exhibit compliance with exercise, home and travel O2 prescription;To learn and understand importance of monitoring SPO2 with pulse oximeter and demonstrate accurate use of the pulse oximeter.;To learn and understand importance of maintaining oxygen saturations>88%;To learn and demonstrate proper pursed lip breathing  techniques or other breathing techniques.;To learn and demonstrate proper use of respiratory medications    Long  Term Goals Exhibits compliance with exercise, home and travel O2 prescription;Verbalizes importance of monitoring SPO2 with pulse oximeter and return demonstration;Maintenance of O2 saturations>88%;Compliance with respiratory medication;Demonstrates proper use of MDI's;Exhibits proper breathing techniques, such as pursed lip breathing or other method taught during program session    Comments Maci is compliant with her oxygen and using her PLB constantly. Sometimes she will take it off when she is resting to giver herself a break. She reports her breathing is a little bit better with her ADLs.    Goals/Expected Outcomes Short: Continue to use PLB to manage breathing with exercise Long; Continued compliance.           Initial Exercise Prescription:  Initial Exercise Prescription - 08/16/20 1600  Date of Initial Exercise RX and Referring Provider   Date 08/16/20    Referring Provider Aleskerov      Treadmill   MPH 1    Grade 0    Minutes 15    METs 1.77      NuStep   Level 1    SPM 80    Minutes 15    METs 2      REL-XR   Level 1    Speed 50    Minutes 15    METs 2      Prescription Details   Frequency (times per week) 3    Duration Progress to 30 minutes of continuous aerobic without signs/symptoms of physical distress      Intensity   THRR 40-80% of Max Heartrate 123-145    Ratings of Perceived Exertion 11-15    Perceived Dyspnea 0-4      Resistance Training   Training Prescription Yes    Weight 3 lb    Reps 10-15           Perform Capillary Blood Glucose checks as needed.  Exercise Prescription Changes:  Exercise Prescription Changes    Row Name 08/16/20 1600 09/15/20 1300 09/15/20 1400 09/30/20 1500 10/13/20 1700     Response to Exercise   Blood Pressure (Admit) 114/64 -- 150/60 144/68 152/78   Blood Pressure (Exercise) 188/78 --  162/78 150/74 146/76   Blood Pressure (Exit) 118/68 -- 130/80 148/76 140/78   Heart Rate (Admit) 101 bpm -- 79 bpm 99 bpm 113 bpm   Heart Rate (Exercise) 115 bpm -- 106 bpm 105 bpm 109 bpm   Heart Rate (Exit) 107 bpm -- 109 bpm 94 bpm 98 bpm   Oxygen Saturation (Admit) 96 % -- 98 % 91 % 90 %   Oxygen Saturation (Exercise) 93 % -- 97 % 94 % 90 %   Oxygen Saturation (Exit) 96 % -- 98 % 96 % 96 %   Rating of Perceived Exertion (Exercise) 13 -- 13 13 12    Perceived Dyspnea (Exercise) 3 -- 2.5 1 1    Symptoms none -- none -- --   Duration -- -- Progress to 30 minutes of  aerobic without signs/symptoms of physical distress Continue with 30 min of aerobic exercise without signs/symptoms of physical distress. Continue with 30 min of aerobic exercise without signs/symptoms of physical distress.   Intensity -- -- THRR unchanged THRR unchanged THRR unchanged     Progression   Progression -- -- Continue to progress workloads to maintain intensity without signs/symptoms of physical distress. Continue to progress workloads to maintain intensity without signs/symptoms of physical distress. Continue to progress workloads to maintain intensity without signs/symptoms of physical distress.   Average METs -- -- 2.5 2 2      Resistance Training   Training Prescription -- -- Yes Yes Yes   Weight -- -- 5 lb 5 lb 3 lb   Reps -- -- 10-15 10-15 10-15     Interval Training   Interval Training -- -- -- No --     NuStep   Level -- -- 4 -- 4   SPM -- -- 80 -- 80   Minutes -- -- 15 -- 15   METs -- -- 2.5 -- 2     Biostep-RELP   Level -- -- -- 2 --   SPM -- -- -- 50 --   Minutes -- -- -- 15 --   METs -- -- -- 2 --  Home Exercise Plan   Plans to continue exercise at -- Home (comment)  walking, staff videos -- Home (comment)  walking, staff videos --   Frequency -- Add 1 additional day to program exercise sessions. -- Add 1 additional day to program exercise sessions. --   Initial Home Exercises Provided --  09/15/20 -- 09/15/20 --          Exercise Comments:   Exercise Goals and Review:  Exercise Goals    Row Name 08/16/20 1616             Exercise Goals   Increase Physical Activity Yes       Intervention Provide advice, education, support and counseling about physical activity/exercise needs.;Develop an individualized exercise prescription for aerobic and resistive training based on initial evaluation findings, risk stratification, comorbidities and participant's personal goals.       Expected Outcomes Short Term: Attend rehab on a regular basis to increase amount of physical activity.;Long Term: Add in home exercise to make exercise part of routine and to increase amount of physical activity.;Long Term: Exercising regularly at least 3-5 days a week.       Increase Strength and Stamina Yes       Intervention Provide advice, education, support and counseling about physical activity/exercise needs.;Develop an individualized exercise prescription for aerobic and resistive training based on initial evaluation findings, risk stratification, comorbidities and participant's personal goals.       Expected Outcomes Short Term: Increase workloads from initial exercise prescription for resistance, speed, and METs.;Short Term: Perform resistance training exercises routinely during rehab and add in resistance training at home;Long Term: Improve cardiorespiratory fitness, muscular endurance and strength as measured by increased METs and functional capacity ( )       Able to understand and use rate of perceived exertion (RPE) scale Yes       Intervention Provide education and explanation on how to use RPE scale       Expected Outcomes Short Term: Able to use RPE daily in rehab to express subjective intensity level;Long Term:  Able to use RPE to guide intensity level when exercising independently       Able to understand and use Dyspnea scale Yes       Intervention Provide education and explanation on  how to use Dyspnea scale       Expected Outcomes Short Term: Able to use Dyspnea scale daily in rehab to express subjective sense of shortness of breath during exertion;Long Term: Able to use Dyspnea scale to guide intensity level when exercising independently       Knowledge and understanding of Target Heart Rate Range (THRR) Yes       Intervention Provide education and explanation of THRR including how the numbers were predicted and where they are located for reference       Expected Outcomes Short Term: Able to state/look up THRR;Short Term: Able to use daily as guideline for intensity in rehab;Long Term: Able to use THRR to govern intensity when exercising independently       Able to check pulse independently Yes       Intervention Provide education and demonstration on how to check pulse in carotid and radial arteries.;Review the importance of being able to check your own pulse for safety during independent exercise       Expected Outcomes Short Term: Able to explain why pulse checking is important during independent exercise;Long Term: Able to check pulse independently and accurately  Understanding of Exercise Prescription Yes       Intervention Provide education, explanation, and written materials on patient's individual exercise prescription       Expected Outcomes Short Term: Able to explain program exercise prescription;Long Term: Able to explain home exercise prescription to exercise independently              Exercise Goals Re-Evaluation :  Exercise Goals Re-Evaluation    Row Name 09/06/20 1406 09/09/20 1334 09/15/20 1353 09/30/20 1521 10/13/20 1354     Exercise Goal Re-Evaluation   Exercise Goals Review Increase Physical Activity;Able to understand and use rate of perceived exertion (RPE) scale;Knowledge and understanding of Target Heart Rate Range (THRR);Understanding of Exercise Prescription;Increase Strength and Stamina;Able to understand and use Dyspnea scale;Able to check  pulse independently Increase Physical Activity;Increase Strength and Stamina;Understanding of Exercise Prescription Increase Physical Activity;Increase Strength and Stamina;Understanding of Exercise Prescription;Able to understand and use rate of perceived exertion (RPE) scale;Able to understand and use Dyspnea scale;Knowledge and understanding of Target Heart Rate Range (THRR);Able to check pulse independently Increase Physical Activity;Increase Strength and Stamina Increase Physical Activity;Increase Strength and Stamina   Comments Reviewed RPE and dyspnea scales, THR and program prescription with pt today.  Pt voiced understanding and was given a copy of goals to take home. Alexus is on her third full day of exercise.  She is on the recumbent elliptical for the first time and finding it tough.  She is already doing some hand weights at home.  We will talk about  home exercise in about a week to add in class regularly first. Reviewed home exercise with pt today.  Pt plans to walk and use weights for exercise.  We also talked about using staff videos for exericse.   Reviewed THR, pulse, RPE, sign and symptoms, pulse oximetery and when to call 911 or MD.  Also discussed weather considerations and indoor options.  Pt voiced understanding. Heike is doing well with exercise.  Oxygen has been in the 90s during exercise.  We will continue to monitor progress. Kathie reports doing upper body exercise videos (weighted - 3 lbs) - just started - would like to do it 2x/week. She reports walking a little bit - shereports anxeity gets in her way - especially when she [place]. She got a referral to talk to someone regarding her anxiety.   Expected Outcomes Short: Use RPE daily to regulate intensity. Long: Follow program prescription in THR. Short: Attend regularly Long: Continue to follow program prescrition Short: Start to add in home exericse week after Thanksgiving  Long: COntinue to exericse  independently Short:  attend consistently Long:  increase overall stamina ST: continue to do weights at home and attend rehab, see specialist regarding anxiety to help with anxiety and going to the gym LT: 150 minutes of moderate activity and 2x/week of weights   Row Name 11/24/20 1521             Exercise Goal Re-Evaluation   Comments Out since last review              Discharge Exercise Prescription (Final Exercise Prescription Changes):  Exercise Prescription Changes - 10/13/20 1700      Response to Exercise   Blood Pressure (Admit) 152/78    Blood Pressure (Exercise) 146/76    Blood Pressure (Exit) 140/78    Heart Rate (Admit) 113 bpm    Heart Rate (Exercise) 109 bpm    Heart Rate (Exit) 98 bpm  Oxygen Saturation (Admit) 90 %    Oxygen Saturation (Exercise) 90 %    Oxygen Saturation (Exit) 96 %    Rating of Perceived Exertion (Exercise) 12    Perceived Dyspnea (Exercise) 1    Duration Continue with 30 min of aerobic exercise without signs/symptoms of physical distress.    Intensity THRR unchanged      Progression   Progression Continue to progress workloads to maintain intensity without signs/symptoms of physical distress.    Average METs 2      Resistance Training   Training Prescription Yes    Weight 3 lb    Reps 10-15      NuStep   Level 4    SPM 80    Minutes 15    METs 2           Nutrition:  Target Goals: Understanding of nutrition guidelines, daily intake of sodium 1500mg , cholesterol 200mg , calories 30% from fat and 7% or less from saturated fats, daily to have 5 or more servings of fruits and vegetables.  Education: All About Nutrition: -Group instruction provided by verbal, written material, interactive activities, discussions, models, and posters to present general guidelines for heart healthy nutrition including fat, fiber, MyPlate, the role of sodium in heart healthy nutrition, utilization of the nutrition label, and utilization of this  knowledge for meal planning. Follow up email sent as well. Written material given at graduation. Flowsheet Row Pulmonary Rehab from 10/13/2020 in [facility] Cardiac and Pulmonary Rehab  Date 09/08/20  Educator Kahi Mohala  Instruction Review Code 1- Verbalizes Understanding      Biometrics:  Pre Biometrics - 08/16/20 1616      Pre Biometrics   Height 5\' 5"  (1.651 m)    Weight 149 lb 4.8 oz (67.7 kg)    BMI (Calculated) 24.84    Single Leg Stand 7.98 seconds            Nutrition Therapy Plan and Nutrition Goals:  Nutrition Therapy & Goals - 09/13/20 1358      Nutrition Therapy   Diet Heart healty, low Na, pulmonary MNT    Protein (specify units) 80g    Fiber 25 grams    Whole Grain Foods 3 servings    Saturated Fats 12 max. grams    Fruits and Vegetables 8 servings/day    Sodium 1.5 grams      Personal Nutrition Goals   Nutrition Goal ST: Add protein to breakfast - boiled egg or nut butter and to lunch - peanut butter, choose low sodium or no salt added canned food items LT: lose weight (she would like to lose her stomach), eat 8 fruits/vegetables per day, eat a rainbow of fruits and vegetables every week, meet protein needs    Comments She reports eating smaller portions. Coffee: 1 sweet and low and some creamer. B: cereal or bagel with cream cheese or bacon and eggs with grits or frozen waffles Snack: piece of fruit or nabs with soft drink (regular) D: meat with two vegetables. Meat: ground beef  to make meatloaf, chicken, seafood, pork. Red meat 2x/week). She reports liking all vegetables. Potatoes and corn. She likes peas, black eyed peas, navy beans, turnip greens. She will typically have nonstarchy and starchy vegetables. She will use some olive oil and not much grease. She will usually bake or airfry. S: ice cream sandwich, recently have been getting frozen real fruit popsicles. Discussed heart healthy eating and pulmonary MNT.      Intervention Plan  Intervention Prescribe, educate  and counsel regarding individualized specific dietary modifications aiming towards targeted core components such as weight, hypertension, lipid management, diabetes, heart failure and other comorbidities.;Nutrition handout(s) given to patient.    Expected Outcomes Short Term Goal: Understand basic principles of dietary content, such as calories, fat, sodium, cholesterol and nutrients.;Short Term Goal: A plan has been developed with personal nutrition goals set during dietitian appointment.;Long Term Goal: Adherence to prescribed nutrition plan.           Nutrition Assessments:  Nutrition Assessments - 08/16/20 1619      MEDFICTS Scores   Pre Score 35          MEDIFICTS Score Key:  ?70 Need to make dietary changes   40-70 Heart Healthy Diet  ? 40 Therapeutic Level Cholesterol Diet   Picture Your Plate Scores:  <16<40 Unhealthy dietary pattern with much room for improvement.  41-50 Dietary pattern unlikely to meet recommendations for good health and room for improvement.  51-60 More healthful dietary pattern, with some room for improvement.   >60 Healthy dietary pattern, although there may be some specific behaviors that could be improved.   Nutrition Goals Re-Evaluation:  Nutrition Goals Re-Evaluation    Row Name 09/09/20 1336 10/13/20 1348           Goals   Nutrition Goal set up meeting with Melissa ST: Add protein to breakfast - boiled egg or nut butter and to lunch - peanut butter, choose low sodium or no salt added canned food items LT: lose weight (she would like to lose her stomach), eat 8 fruits/vegetables per day, eat a rainbow of fruits and vegetables every week, meet protein needs      Comment [name] will schedule an appointment with Melissa for her diet. Danyell reports draining canned foods, but not rinsing all of them. Suggested getting low sodium canned items and rinsing them off. She reports not eating much protein still and has sugary yogurt. Suggested eggs,  peanut butter, greek yogurt as additions to breakfast.      Expected Outcome Meet with dietitian ST: Add protein to breakfast - boiled egg or nut butter and to lunch - peanut butter, choose low sodium or no salt added canned food items LT: lose weight (she would like to lose her stomach), eat 8 fruits/vegetables per day, eat a rainbow of fruits and vegetables every week, meet protein needs             Nutrition Goals Discharge (Final Nutrition Goals Re-Evaluation):  Nutrition Goals Re-Evaluation - 10/13/20 1348      Goals   Nutrition Goal ST: Add protein to breakfast - boiled egg or nut butter and to lunch - peanut butter, choose low sodium or no salt added canned food items LT: lose weight (she would like to lose her stomach), eat 8 fruits/vegetables per day, eat a rainbow of fruits and vegetables every week, meet protein needs    Comment Jenessa reports draining canned foods, but not rinsing all of them. Suggested getting low sodium canned items and rinsing them off. She reports not eating much protein still and has sugary yogurt. Suggested eggs, peanut butter, greek yogurt as additions to breakfast.    Expected Outcome ST: Add protein to breakfast - boiled egg or nut butter and to lunch - peanut butter, choose low sodium or no salt added canned food items LT: lose weight (she would like to lose her stomach), eat 8 fruits/vegetables per day, eat a rainbow of fruits  and vegetables every week, meet protein needs           Psychosocial: Target Goals: Acknowledge presence or absence of significant depression and/or stress, maximize coping skills, provide positive support system. Participant is able to verbalize types and ability to use techniques and skills needed for reducing stress and depression.   Education: Stress, Anxiety, and Depression - Group verbal and visual presentation to define topics covered.  Reviews how body is impacted by stress, anxiety, and depression.  Also discusses healthy  ways to reduce stress and to treat/manage anxiety and depression.  Written material given at graduation. Flowsheet Row Pulmonary Rehab from 05/29/2016 in [facility] Cardiac and Pulmonary Rehab  Date 04/12/16  Educator Jeannetta Ellis, [facility]  Instruction Review Code (retired) 2- meets goals/outcomes      Education: Sleep Hygiene -Provides group verbal and written instruction about how sleep can affect your health.  Define sleep hygiene, discuss sleep cycles and impact of sleep habits. Review good sleep hygiene tips.    Initial Review & Psychosocial Screening:  Initial Psych Review & Screening - 08/11/20 1114      Initial Review   Current issues with Current Depression;Current Psychotropic Meds;Current Stress Concerns    Source of Stress Concerns Unable to participate in former interests or hobbies;Unable to perform yard/household activities;Chronic Illness    Comments Chronic illness causing not able to do things she would like to do.      Family Dynamics   Good Support System? Yes   Partner 7 years together in home, daughter not far about 10 min down the road.     Barriers   Psychosocial barriers to participate in program There are no identifiable barriers or psychosocial needs.;The patient should benefit from training in stress management and relaxation.      Screening Interventions   Interventions Encouraged to exercise;Provide feedback about the scores to participant;To provide support and resources with identified psychosocial needs    Expected Outcomes Short Term goal: Utilizing psychosocial counselor, staff and physician to assist with identification of specific Stressors or current issues interfering with healing process. Setting desired goal for each stressor or current issue identified.;Long Term Goal: Stressors or current issues are controlled or eliminated.;Short Term goal: Identification and review with participant of any Quality of Life or Depression concerns found by scoring the  questionnaire.;Long Term goal: The participant improves quality of Life and PHQ9 Scores as seen by post scores and/or verbalization of changes           Quality of Life Scores:  Scores of 19 and below usually indicate a poorer quality of life in these areas.  A difference of  2-3 points is a clinically meaningful difference.  A difference of 2-3 points in the total score of the Quality of Life Index has been associated with significant improvement in overall quality of life, self-image, physical symptoms, and general health in studies assessing change in quality of life.  PHQ-9: Recent Review Flowsheet Data    Depression screen [facility] 2/9 08/16/2020 05/26/2016 02/15/2016   Decreased Interest 1 0 0   Down, Depressed, Hopeless 1 1 0   PHQ - 2 Score 2 1 0   Altered sleeping 1 3 1    Tired, decreased energy 1 1 1    Change in appetite 0 0 1   Feeling bad or failure about yourself  0 0 0   Trouble concentrating 0 0 0   Moving slowly or fidgety/restless 0 0 0   Suicidal thoughts 0 0 0  PHQ-9 Score 4 5 3    Difficult doing work/chores Somewhat difficult Somewhat difficult Not difficult at all     Interpretation of Total Score  Total Score Depression Severity:  1-4 = Minimal depression, 5-9 = Mild depression, 10-14 = Moderate depression, 15-19 = Moderately severe depression, 20-27 = Severe depression   Psychosocial Evaluation and Intervention:  Psychosocial Evaluation - 08/11/20 1127      Psychosocial Evaluation & Interventions   Comments Romaine has no barriers to entering the program. She has attended PR in the past and remembers how it helped her have more energy to manage her daily ADLs and chores.  She does hae stress over the chronic illness and her inablility to manage her daily activities and activities she would like to participate in daily. She is looking forward to exercising and feeling better.  She lives at home with her partner of 7 years. Her daughter lives very close by and is  part of her support system. Her goal is to be able to move around more than she is right now. She is ready to start and work towards that goal.    Expected Outcomes STG: Joleah is able to attend all scheduled sessions.  LTG: Khamille benefits from the prgram in being able to manage more daily activiyies and decreasing her stress of not having energy to do what she likes.    Continue Psychosocial Services  Follow up required by staff           Psychosocial Re-Evaluation:  Psychosocial Re-Evaluation    Row Name 09/09/20 1336 10/13/20 1403           Psychosocial Re-Evaluation   Current issues with Current Stress Concerns;Current Anxiety/Panic Current Anxiety/Panic;Current Stress Concerns      Comments Caitlynn is just getting started in rehab. She noticed her anxiety was really high her first day, but can already tell a difference in it calming down.  She knows that exercise is going to help her manage it better.  She still has a hard time sleeping at night.  She usually averages abour 4-5 hours a night.  She is not sure what keeps waking her at night. She has had a sleep study before.  She does have a fan that helps her sleep too. She reports having some stress concerns - she reports having stress regarding her partner (arguments - "he is never wrong"). She has a referral for a psychiatrist in regards to her anxiety and panic. She has anxiety going into stores and the gym to exercise. She is sleeping better 6 hours a night now - anxiety was waking her up at night previously.      Expected Outcomes Short: Continue to exercise to help with anxiety  Long: Continue to work on sleep Short: Continue to exercise to help with anxiety, see psychiatrist  Long: Continue to work on sleep and anxiety management      Interventions Encouraged to attend Pulmonary Rehabilitation for the exercise;Stress management education Encouraged to attend Pulmonary Rehabilitation for the exercise;Stress management education       Continue Psychosocial Services  Follow up required by staff --      Comments Chronic illness causing not able to do things she would like to do. She reports feeling better about her chronic illness.             Initial Review   Source of Stress Concerns Unable to participate in former interests or hobbies;Unable to perform yard/household activities;Chronic Illness Family  Psychosocial Discharge (Final Psychosocial Re-Evaluation):  Psychosocial Re-Evaluation - 10/13/20 1403      Psychosocial Re-Evaluation   Current issues with Current Anxiety/Panic;Current Stress Concerns    Comments She reports having some stress concerns - she reports having stress regarding her partner (arguments - "he is never wrong"). She has a referral for a psychiatrist in regards to her anxiety and panic. She has anxiety going into stores and the gym to exercise. She is sleeping better 6 hours a night now - anxiety was waking her up at night previously.    Expected Outcomes Short: Continue to exercise to help with anxiety, see psychiatrist  Long: Continue to work on sleep and anxiety management    Interventions Encouraged to attend Pulmonary Rehabilitation for the exercise;Stress management education    Comments She reports feeling better about her chronic illness.      Initial Review   Source of Stress Concerns Family           Education: Education Goals: Education classes will be provided on a weekly basis, covering required topics. Participant will state understanding/return demonstration of topics presented.  Learning Barriers/Preferences:   General Pulmonary Education Topics:  Infection Prevention: - Provides verbal and written material to individual with discussion of infection control including proper hand washing and proper equipment cleaning during exercise session. Flowsheet Row Pulmonary Rehab from 10/13/2020 in [facility] Cardiac and Pulmonary Rehab  Date 08/16/20  Educator AS   Instruction Review Code 1- Verbalizes Understanding      Falls Prevention: - Provides verbal and written material to individual with discussion of falls prevention and safety. Flowsheet Row Pulmonary Rehab from 10/13/2020 in [facility] Cardiac and Pulmonary Rehab  Date 08/16/20  Educator As  Instruction Review Code 1- Verbalizes Understanding      Chronic Lung Disease Review: - Group verbal instruction with posters, models, PowerPoint presentations and videos,  to review new updates, new respiratory medications, new advancements in procedures and treatments. Providing information on websites and "800" numbers for continued self-education. Includes information about supplement oxygen, available portable oxygen systems, continuous and intermittent flow rates, oxygen safety, concentrators, and Medicare reimbursement for oxygen. Explanation of Pulmonary Drugs, including class, frequency, complications, importance of spacers, rinsing mouth after steroid MDI's, and proper cleaning methods for nebulizers. Review of basic lung anatomy and physiology related to function, structure, and complications of lung disease. Review of risk factors. Discussion about methods for diagnosing sleep apnea and types of masks and machines for OSA. Includes a review of the use of types of environmental controls: home humidity, furnaces, filters, dust mite/pet prevention, HEPA vacuums. Discussion about weather changes, air quality and the benefits of nasal washing. Instruction on Warning signs, infection symptoms, calling MD promptly, preventive modes, and value of vaccinations. Review of effective airway clearance, coughing and/or vibration techniques. Emphasizing that all should Create an Action Plan. Written material given at graduation. Flowsheet Row Pulmonary Rehab from 10/13/2020 in [facility] Cardiac and Pulmonary Rehab  Date 09/29/20  Educator [facility]  Instruction Review Code 1- Verbalizes Understanding      AED/CPR: - Group  verbal and written instruction with the use of models to demonstrate the basic use of the AED with the basic ABC's of resuscitation.    Anatomy and Cardiac Procedures: - Group verbal and visual presentation and models provide information about basic cardiac anatomy and function. Reviews the testing methods done to diagnose heart disease and the outcomes of the test results. Describes the treatment choices: Medical Management, Angioplasty, or Coronary Bypass Surgery  for treating various heart conditions including Myocardial Infarction, Angina, Valve Disease, and Cardiac Arrhythmias.  Written material given at graduation.   Medication Safety: - Group verbal and visual instruction to review commonly prescribed medications for heart and lung disease. Reviews the medication, class of the drug, and side effects. Includes the steps to properly store meds and maintain the prescription regimen.  Written material given at graduation. Flowsheet Row Pulmonary Rehab [facility]13/2020 [facility] Card[facility] and Pulmonary Rehab  Date 09/15/20  Educator [facility]  Instruction Review Code 1- Verbalizes Understanding      Other: -Provides group and verbal instruction on various topics (see comments)   Knowledge Questionnaire Score:  Knowledge Questionnaire Score - 08/16/20 1619      Knowledge Questionnaire Score   Pre Score 16/18 oxygen            Core Components/Risk Factors/Patient Goals at Admission:  Personal Goals and Risk Factors at Admission - 08/16/20 1621      Core Components/Risk Factors/Patient Goals on Admission    Weight Management Yes    Intervention Weight Management: Develop a combined nutrition and exercise program designed to reach desired caloric intake, while maintaining appropriate intake of nutrient and fiber, sodium and fats, and appropriate energy expenditure required for the weight goal.;Weight Management/Obesity: Establish reasonable short term and long term weight goals.;Weight  Management: Provide education and appropriate resources to help participant work on and attain dietary goals.;Obesity: Provide education and appropriate resources to help participant work on and attain dietary goals.    Admit Weight 149 lb 4.8 oz (67.7 kg)    Goal Weight: Short Term 145 lb (65.8 kg)    Goal Weight: Long Term 130 lb (59 kg)    Expected Outcomes Short Term: Continue to assess and modify interventions until short term weight is achieved;Long Term: Adherence to nutrition and physical activity/exercise program aimed toward attainment of established weight goal;Weight Loss: Understanding of general recommendations for a balanced deficit meal plan, which promotes 1-2 lb weight loss per week and includes a negative energy balance of [phone] kcal/d    Improve shortness of breath with ADL's Yes           Education:Diabetes - Individual verbal and written instruction to review signs/symptoms of diabetes, desired ranges of glucose level fasting, after meals and with exercise. Acknowledge that pre and post exercise glucose checks will be done for 3 sessions at entry of program.   Know Your Numbers and Heart Failure: - Group verbal and visual instruction to discuss disease risk factors for cardiac and pulmonary disease and treatment options.  Reviews associated critical values for Overweight/Obesity, Hypertension, Cholesterol, and Diabetes.  Discusses basics of heart failure: signs/symptoms and treatments.  Introduces Heart Failure Zone chart for action plan for heart failure.  Written material given at graduation.   Core Components/Risk Factors/Patient Goals Review:   Goals and Risk Factor Review    Row Name 09/09/20 1339 10/13/20 1412           Core Components/Risk Factors/Patient Goals Review   Personal Goals Review Weight Management/Obesity;Improve shortness of breath with ADL's Weight Management/Obesity;Improve shortness of breath with ADL's      Review Kiyah is off to a good  start in rehab.  Her weight has been holding steady. She is trying to work on her breathing and can already tell exercise has helped with her anxiety. She will continue to use her PLB. Vicktoria reports her weight has been stable. She continues to work on her breathing and  feels it is getting better with her ADLs. she continues to exercise and practice PLB.      Expected Outcomes Short: Continue to work on weight loss Long; Continue to improve breathing. Short: Continue to come to rehab and workout on her own Long; Continue to improve breathing.             Core Components/Risk Factors/Patient Goals at Discharge (Final Review):   Goals and Risk Factor Review - 10/13/20 1412      Core Components/Risk Factors/Patient Goals Review   Personal Goals Review Weight Management/Obesity;Improve shortness of breath with ADL's    Review Dioselina reports her weight has been stable. She continues to work on her breathing and feels it is getting better with her ADLs. she continues to exercise and practice PLB.    Expected Outcomes Short: Continue to come to rehab and workout on her own Long; Continue to improve breathing.           ITP Comments:  ITP Comments    Row Name 08/11/20 1134 08/16/20 1624 09/01/20 0616 09/06/20 1406 09/29/20 0915   ITP Comments Virtual orientation call completed today. shehas an appointment on Date: 08/16/2020 for EP eval and gym Orientation.  Documentation of diagnosis can be found in [facility]  Date: [id number]. Completed and gym orientation. Initial ITP created and sent for review to Dr. Bethann Punches, Medical Director. 30 Day review completed. Medical Director ITP review done, changes made as directed, and signed approval by Medical Director. First full day of exercise!  Patient was oriented to gym and equipment including functions, settings, policies, and procedures.  Patient's individual exercise prescription and treatment plan were reviewed.  All starting workloads were established  based on the results of the 6 minute walk test done at initial orientation visit.  The plan for exercise progression was also introduced and progression will be customized based on patient's performance and goals. 30 Day review completed. Medical Director ITP review done, changes made as directed, and signed approval by Medical Director.   Row Name 10/27/20 0532 10/27/20 1559 11/10/20 1116 11/24/20 0856 11/24/20 1520   ITP Comments 30 Day review completed. Medical Director ITP review done, changes made as directed, and signed approval by Medical Director.   only 3 visits since last review Jasmyn has not attended since last review. Twanisha has been in the hospital and hasnt been in since last review 30 Day review completed. Medical Director ITP review done, changes made as directed, and signed approval by Medical Director. Out since 10/13/20 with readmission.  Left message today   Row Name 12/07/20 1444           ITP Comments Tashiya called to let us know that she would like to discharge from the program at this time. She is currently dealing with panic attacks and needs to get her anxiety back under better control.              Comments: Discharge ITP

## 2020-12-07 NOTE — Telephone Encounter (Signed)
Heidi [name] called to let us know that she would like to discharge from the program at this time. She is currently dealing with panic attacks and needs to get her anxiety back under better control.

## 2020-12-07 NOTE — Progress Notes (Signed)
Discharge Progress Report  Patient Details  Name: Heidi [name] MRN: [id number] Date of Birth: December 29, 1955 Referring Provider:   Flowsheet Row Pulmonary Rehab from 08/16/2020 in [facility]  Referring Provider Aleskerov       Number of Visits: 10  Reason for Discharge:  Early Exit:  Personal  Smoking History:  Social History   Tobacco Use  Smoking Status Former Smoker  . Packs/day: 1.00  . Years: 30.00  . Pack years: 30.00  . Types: Cigarettes  . Quit date: 01/21/2012  . Years since quitting: 8.8  Smokeless Tobacco Never Used    Diagnosis:  Chronic obstructive pulmonary disease, unspecified COPD type (HCC)  ADL UCSD:  Pulmonary Assessment Scores    Row Name 08/16/20 1618         ADL UCSD   ADL Phase Entry     SOB Score total 65     Rest 1     Walk 2     Stairs 5     Bath 1     Dress 1     Shop 2           CAT Score   CAT Score 29           mMRC Score   mMRC Score 3            Initial Exercise Prescription:  Initial Exercise Prescription - 08/16/20 1600      Date of Initial Exercise RX and Referring Provider   Date 08/16/20    Referring Provider Aleskerov      Treadmill   MPH 1    Grade 0    Minutes 15    METs 1.77      NuStep   Level 1    SPM 80    Minutes 15    METs 2      REL-XR   Level 1    Speed 50    Minutes 15    METs 2      Prescription Details   Frequency (times per week) 3    Duration Progress to 30 minutes of continuous aerobic without signs/symptoms of physical distress      Intensity   THRR 40-80% of Max Heartrate 123-145    Ratings of Perceived Exertion 11-15    Perceived Dyspnea 0-4      Resistance Training   Training Prescription Yes    Weight 3 lb    Reps 10-15           Discharge Exercise Prescription (Final Exercise Prescription Changes):  Exercise Prescription Changes - 10/13/20 1700      Response to Exercise   Blood Pressure (Admit) 152/78    Blood Pressure (Exercise)  146/76    Blood Pressure (Exit) 140/78    Heart Rate (Admit) 113 bpm    Heart Rate (Exercise) 109 bpm    Heart Rate (Exit) 98 bpm    Oxygen Saturation (Admit) 90 %    Oxygen Saturation (Exercise) 90 %    Oxygen Saturation (Exit) 96 %    Rating of Perceived Exertion (Exercise) 12    Perceived Dyspnea (Exercise) 1    Duration Continue with 30 min of aerobic exercise without signs/symptoms of physical distress.    Intensity THRR unchanged      Progression   Progression Continue to progress workloads to maintain intensity without signs/symptoms of physical distress.    Average METs 2      Paramedicesistance Training   Training  Prescription Yes    Weight 3 lb    Reps 10-15      NuStep   Level 4    SPM 80    Minutes 15    METs 2           Functional Capacity:  6 Minute Walk    Row Name 08/16/20 1606         6 Minute Walk   Phase Initial     Distance 645 feet     Walk Time 5.33 minutes     # of Rest Breaks 3     MPH 1.4     METS 2.82     RPE 13     Perceived Dyspnea  3     VO2 Peak 9.9     Symptoms No     Resting HR 101 bpm     Resting BP 114/64     Resting Oxygen Saturation  96 %     Exercise Oxygen Saturation  during 6 min walk 93 %     Max Ex. HR 115 bpm     Max Ex. BP 188/78     2 Minute Post BP 118/68           Interval HR   1 Minute HR 100     2 Minute HR 110     3 Minute HR 110     4 Minute HR 115     5 Minute HR 109     6 Minute HR 104     2 Minute Post HR 107     Interval Heart Rate? Yes           Interval Oxygen   Interval Oxygen? Yes     Baseline Oxygen Saturation % 96 %     1 Minute Oxygen Saturation % 95 %     1 Minute Liters of Oxygen 3 L     2 Minute Oxygen Saturation % 94 %     2 Minute Liters of Oxygen 3 L     3 Minute Oxygen Saturation % 94 %     3 Minute Liters of Oxygen 3 L     4 Minute Oxygen Saturation % 94 %     4 Minute Liters of Oxygen 3 L     5 Minute Oxygen Saturation % 93 %     5 Minute Liters of Oxygen 3 L     6 Minute  Oxygen Saturation % 93 %     6 Minute Liters of Oxygen 3 L     2 Minute Post Oxygen Saturation % 96 %     2 Minute Post Liters of Oxygen 3 L            Psychological, QOL, Others - Outcomes: PHQ 2/9: Depression screen [facility] 2/9 08/16/2020 05/26/2016 02/15/2016  Decreased Interest 1 0 0  Down, Depressed, Hopeless 1 1 0  PHQ - 2 Score 2 1 0  Altered sleeping 1 3 1   Tired, decreased energy 1 1 1   Change in appetite 0 0 1  Feeling bad or failure about yourself  0 0 0  Trouble concentrating 0 0 0  Moving slowly or fidgety/restless 0 0 0  Suicidal thoughts 0 0 0  PHQ-9 Score 4 5 3   Difficult doing work/chores Somewhat difficult Somewhat difficult Not difficult at all  Some recent data might be hidden    Quality of Life:   Personal Goals: Goals established at orientation with  interventions provided to work toward goal.  Personal Goals and Risk Factors at Admission - 08/16/20 1621      Core Components/Risk Factors/Patient Goals on Admission    Weight Management Yes    Intervention Weight Management: Develop a combined nutrition and exercise program designed to reach desired caloric intake, while maintaining appropriate intake of nutrient and fiber, sodium and fats, and appropriate energy expenditure required for the weight goal.;Weight Management/Obesity: Establish reasonable short term and long term weight goals.;Weight Management: Provide education and appropriate resources to help participant work on and attain dietary goals.;Obesity: Provide education and appropriate resources to help participant work on and attain dietary goals.    Admit Weight 149 lb 4.8 oz (67.7 kg)    Goal Weight: Short Term 145 lb (65.8 kg)    Goal Weight: Long Term 130 lb (59 kg)    Expected Outcomes Short Term: Continue to assess and modify interventions until short term weight is achieved;Long Term: Adherence to nutrition and physical activity/exercise program aimed toward attainment of established weight  goal;Weight Loss: Understanding of general recommendations for a balanced deficit meal plan, which promotes 1-2 lb weight loss per week and includes a negative energy balance of [phone] kcal/d    Improve shortness of breath with ADL's Yes            Personal Goals Discharge:  Goals and Risk Factor Review    Row Name 09/09/20 1339 10/13/20 1412           Core Components/Risk Factors/Patient Goals Review   Personal Goals Review Weight Management/Obesity;Improve shortness of breath with ADL's Weight Management/Obesity;Improve shortness of breath with ADL's      Review Jc is off to a good start in rehab.  Her weight has been holding steady. She is trying to work on her breathing and can already tell exercise has helped with her anxiety. She will continue to use her PLB. Shavonia reports her weight has been stable. She continues to work on her breathing and feels it is getting better with her ADLs. she continues to exercise and practice PLB.      Expected Outcomes Short: Continue to work on weight loss Long; Continue to improve breathing. Short: Continue to come to rehab and workout on her own Long; Continue to improve breathing.             Exercise Goals and Review:  Exercise Goals    Row Name 08/16/20 1616             Exercise Goals   Increase Physical Activity Yes       Intervention Provide advice, education, support and counseling about physical activity/exercise needs.;Develop an individualized exercise prescription for aerobic and resistive training based on initial evaluation findings, risk stratification, comorbidities and participant's personal goals.       Expected Outcomes Short Term: Attend rehab on a regular basis to increase amount of physical activity.;Long Term: Add in home exercise to make exercise part of routine and to increase amount of physical activity.;Long Term: Exercising regularly at least 3-5 days a week.       Increase Strength and Stamina Yes        Intervention Provide advice, education, support and counseling about physical activity/exercise needs.;Develop an individualized exercise prescription for aerobic and resistive training based on initial evaluation findings, risk stratification, comorbidities and participant's personal goals.       Expected Outcomes Short Term: Increase workloads from initial exercise prescription for resistance, speed, and METs.;Short Term: Perform resistance  training exercises routinely during rehab and add in resistance training at home;Long Term: Improve cardiorespiratory fitness, muscular endurance and strength as measured by increased METs and functional capacity ( )       Able to understand and use rate of perceived exertion (RPE) scale Yes       Intervention Provide education and explanation on how to use RPE scale       Expected Outcomes Short Term: Able to use RPE daily in rehab to express subjective intensity level;Long Term:  Able to use RPE to guide intensity level when exercising independently       Able to understand and use Dyspnea scale Yes       Intervention Provide education and explanation on how to use Dyspnea scale       Expected Outcomes Short Term: Able to use Dyspnea scale daily in rehab to express subjective sense of shortness of breath during exertion;Long Term: Able to use Dyspnea scale to guide intensity level when exercising independently       Knowledge and understanding of Target Heart Rate Range (THRR) Yes       Intervention Provide education and explanation of THRR including how the numbers were predicted and where they are located for reference       Expected Outcomes Short Term: Able to state/look up THRR;Short Term: Able to use daily as guideline for intensity in rehab;Long Term: Able to use THRR to govern intensity when exercising independently       Able to check pulse independently Yes       Intervention Provide education and demonstration on how to check pulse in carotid and  radial arteries.;Review the importance of being able to check your own pulse for safety during independent exercise       Expected Outcomes Short Term: Able to explain why pulse checking is important during independent exercise;Long Term: Able to check pulse independently and accurately       Understanding of Exercise Prescription Yes       Intervention Provide education, explanation, and written materials on patient's individual exercise prescription       Expected Outcomes Short Term: Able to explain program exercise prescription;Long Term: Able to explain home exercise prescription to exercise independently              Exercise Goals Re-Evaluation:  Exercise Goals Re-Evaluation    Row Name 09/06/20 1406 09/09/20 1334 09/15/20 1353 09/30/20 1521 10/13/20 1354     Exercise Goal Re-Evaluation   Exercise Goals Review Increase Physical Activity;Able to understand and use rate of perceived exertion (RPE) scale;Knowledge and understanding of Target Heart Rate Range (THRR);Understanding of Exercise Prescription;Increase Strength and Stamina;Able to understand and use Dyspnea scale;Able to check pulse independently Increase Physical Activity;Increase Strength and Stamina;Understanding of Exercise Prescription Increase Physical Activity;Increase Strength and Stamina;Understanding of Exercise Prescription;Able to understand and use rate of perceived exertion (RPE) scale;Able to understand and use Dyspnea scale;Knowledge and understanding of Target Heart Rate Range (THRR);Able to check pulse independently Increase Physical Activity;Increase Strength and Stamina Increase Physical Activity;Increase Strength and Stamina   Comments Reviewed RPE and dyspnea scales, THR and program prescription with pt today.  Pt voiced understanding and was given a copy of goals to take home. Clorene is on her third full day of exercise.  She is on the recumbent elliptical for the first time and finding it tough.  She is already  doing some hand weights at home.  We will talk about  home exercise in about a  week to add in class regularly first. Reviewed home exercise with pt today.  Pt plans to walk and use weights for exercise.  We also talked about using staff videos for exericse.   Reviewed THR, pulse, RPE, sign and symptoms, pulse oximetery and when to call 911 or MD.  Also discussed weather considerations and indoor options.  Pt voiced understanding. Candance is doing well with exercise.  Oxygen has been in the 90s during exercise.  We will continue to monitor progress. Rowene reports doing upper body exercise videos (weighted - 3 lbs) - just started - would like to do it 2x/week. She reports walking a little bit - shereports anxeity gets in her way - especially when she tries to go [place] the [employer]. She got a referral to talk to someone regarding her anxiety.   Expected Outcomes Short: Use RPE daily to regulate intensity. Long: Follow program prescription in THR. Short: Attend regularly Long: Continue to follow program prescrition Short: Start to add in home exericse week after Thanksgiving  Long: COntinue to exericse independently Short:  attend consistently Long:  increase overall stamina ST: continue to do weights at home and attend rehab, see specialist regarding anxiety to help with anxiety and going to the gym LT: 150 minutes of moderate activity and 2x/week of weights   Row Name 11/24/20 1521             Exercise Goal Re-Evaluation   Comments Out since last review              Nutrition & Weight - Outcomes:  Pre Biometrics - 08/16/20 1616      Pre Biometrics   Height 5\' 5"  (1.651 m)    Weight 149 lb 4.8 oz (67.7 kg)    BMI (Calculated) 24.84    Single Leg Stand 7.98 seconds            Nutrition:  Nutrition Therapy & Goals - 09/13/20 1358      Nutrition Therapy   Diet Heart healty, low Na, pulmonary MNT    Protein (specify units) 80g    Fiber 25 grams    Whole Grain Foods 3 servings     Saturated Fats 12 max. grams    Fruits and Vegetables 8 servings/day    Sodium 1.5 grams      Personal Nutrition Goals   Nutrition Goal ST: Add protein to breakfast - boiled egg or nut butter and to lunch - peanut butter, choose low sodium or no salt added canned food items LT: lose weight (she would like to lose her stomach), eat 8 fruits/vegetables per day, eat a rainbow of fruits and vegetables every week, meet protein needs    Comments She reports eating smaller portions. Coffee: 1 sweet and low and some creamer. B: cereal or bagel with cream cheese or bacon and eggs with grits or frozen waffles Snack: piece of fruit or nabs with soft drink (regular) D: meat with two vegetables. Meat: ground beef  to make meatloaf, chicken, seafood, pork. Red meat 2x/week). She reports liking all vegetables. Potatoes and corn. She likes peas, black eyed peas, navy beans, turnip greens. She will typically have nonstarchy and starchy vegetables. She will use some olive oil and not much grease. She will usually bake or airfry. S: ice cream sandwich, recently have been getting frozen real fruit popsicles. Discussed heart healthy eating and pulmonary MNT.      Intervention Plan   Intervention Prescribe, educate and counsel regarding individualized specific dietary  modifications aiming towards targeted core components such as weight, hypertension, lipid management, diabetes, heart failure and other comorbidities.;Nutrition handout(s) given to patient.    Expected Outcomes Short Term Goal: Understand basic principles of dietary content, such as calories, fat, sodium, cholesterol and nutrients.;Short Term Goal: A plan has been developed with personal nutrition goals set during dietitian appointment.;Long Term Goal: Adherence to prescribed nutrition plan.           Nutrition Discharge:  Nutrition Assessments - 08/16/20 1619      MEDFICTS Scores   Pre Score 35           Education Questionnaire Score:  Knowledge  Questionnaire Score - 08/16/20 1619      Knowledge Questionnaire Score   Pre Score 16/18 oxygen           Goals reviewed with patient; copy given to patient.

## 2021-01-24 ENCOUNTER — Other Ambulatory Visit: Payer: Self-pay | Admitting: Physician Assistant

## 2021-01-24 DIAGNOSIS — Z1231 Encounter for screening mammogram for malignant neoplasm of breast: Secondary | ICD-10-CM

## 2021-02-01 ENCOUNTER — Other Ambulatory Visit: Payer: Self-pay | Admitting: Physician Assistant

## 2021-02-01 ENCOUNTER — Other Ambulatory Visit: Payer: Self-pay

## 2021-02-01 ENCOUNTER — Other Ambulatory Visit
Admission: RE | Admit: 2021-02-01 | Discharge: 2021-02-01 | Disposition: A | Discharge status: Home / Self Care | Payer: Medicare Other | Source: Ambulatory Visit | Attending: Physician Assistant | Admitting: Physician Assistant

## 2021-02-01 ENCOUNTER — Ambulatory Visit
Admission: RE | Admit: 2021-02-01 | Discharge: 2021-02-01 | Disposition: A | Discharge status: Home / Self Care | Payer: Medicare Other | Source: Ambulatory Visit | Attending: Physician Assistant | Admitting: Physician Assistant

## 2021-02-01 DIAGNOSIS — Z8679 Personal history of other diseases of the circulatory system: Secondary | ICD-10-CM | POA: Insufficient documentation

## 2021-02-01 DIAGNOSIS — R600 Localized edema: Secondary | ICD-10-CM | POA: Diagnosis not present

## 2021-02-01 LAB — BRAIN NATRIURETIC PEPTIDE: B Natriuretic Peptide: 25.2 pg/mL (ref 0.0–100.0)

## 2021-02-15 ENCOUNTER — Ambulatory Visit
Admission: RE | Admit: 2021-02-15 | Discharge: 2021-02-15 | Disposition: A | Discharge status: Home / Self Care | Payer: Medicare Other | Source: Ambulatory Visit | Attending: Physician Assistant | Admitting: Physician Assistant

## 2021-02-15 ENCOUNTER — Other Ambulatory Visit: Payer: Self-pay

## 2021-02-15 DIAGNOSIS — Z1231 Encounter for screening mammogram for malignant neoplasm of breast: Secondary | ICD-10-CM

## 2021-03-19 ENCOUNTER — Other Ambulatory Visit: Payer: Self-pay | Admitting: Adult Health

## 2021-03-19 DIAGNOSIS — J432 Centrilobular emphysema: Secondary | ICD-10-CM

## 2021-05-11 ENCOUNTER — Other Ambulatory Visit: Payer: Self-pay | Admitting: Adult Health

## 2021-05-11 DIAGNOSIS — J432 Centrilobular emphysema: Secondary | ICD-10-CM

## 2021-05-24 ENCOUNTER — Emergency Department: Payer: Medicare Other

## 2021-05-24 ENCOUNTER — Other Ambulatory Visit: Payer: Self-pay

## 2021-05-24 ENCOUNTER — Emergency Department
Admission: EM | Admit: 2021-05-24 | Discharge: 2021-05-24 | Disposition: A | Discharge status: Home / Self Care | Payer: Medicare Other | Attending: Emergency Medicine | Admitting: Emergency Medicine

## 2021-05-24 DIAGNOSIS — R0781 Pleurodynia: Secondary | ICD-10-CM

## 2021-05-24 DIAGNOSIS — T148XXA Other injury of unspecified body region, initial encounter: Secondary | ICD-10-CM

## 2021-05-24 DIAGNOSIS — S29011A Strain of muscle and tendon of front wall of thorax, initial encounter: Secondary | ICD-10-CM | POA: Insufficient documentation

## 2021-05-24 DIAGNOSIS — Z87891 Personal history of nicotine dependence: Secondary | ICD-10-CM | POA: Insufficient documentation

## 2021-05-24 DIAGNOSIS — S299XXA Unspecified injury of thorax, initial encounter: Secondary | ICD-10-CM | POA: Diagnosis present

## 2021-05-24 DIAGNOSIS — J449 Chronic obstructive pulmonary disease, unspecified: Secondary | ICD-10-CM | POA: Diagnosis not present

## 2021-05-24 DIAGNOSIS — X58XXXA Exposure to other specified factors, initial encounter: Secondary | ICD-10-CM | POA: Diagnosis not present

## 2021-05-24 MED ORDER — HYDROCODONE-ACETAMINOPHEN 5-325 MG PO TABS: 1.0000 | ORAL_TABLET | Freq: Four times a day (QID) | ORAL | 0 refills | Status: AC | PRN

## 2021-05-24 NOTE — Discharge Instructions (Addendum)
Follow-up with your primary care provider if any continued problems.  Continue taking your regular medication as prescribed by your doctor.  A prescription for hydrocodone was sent to the pharmacy to take as needed for pain.  You may also hold a pillow against your ribs when you cough which will decrease the amount of muscle spasms with movement or coughing.  If any worsening of your symptoms such as fever or chills, shortness of breath or difficulty breathing return to the emergency department.

## 2021-05-24 NOTE — ED Provider Notes (Signed)
[facility] Provider Note ___________________________________________   Event Date/Time   First MD Initiated Contact with Patient 05/24/21 1337     (approximate)  I have reviewed the triage vital signs and the nursing notes.   HISTORY  Chief Complaint Rib Injury   HPI Heidi [name] is a 65 y.o. female presents to the ED with complaint of right rib pain for the last 3 days.  Patient denies any injury but states that she has been coughing a lot recently.  Denies any nausea or vomiting.  She denies any fever or chills.  Patient is very concerned that she possibly has pneumonia.  Patient has an extensive history of COPD, acute and chronic respiratory failure, community-acquired pneumonia and currently is on oxygen.  She is unable to get comfortable with repositioning herself.  Currently her pain is a 10/10.        Past Medical History:  Diagnosis Date   Anxiety    COPD (chronic obstructive pulmonary disease) (HCC)    GERD (gastroesophageal reflux disease)    Hypotension    limiting med titration   NSTEMI (non-ST elevated myocardial infarction) (HCC) 2013   12/2011 with normal coronaries possibly secondary to Takotsubo (EF 30-35% by echo), occurred following two episodes of acute respiratory distress   Pneumonia 11/2016   history of   Takotsubo syndrome 4/13    Patient Active Problem List   Diagnosis Date Noted   GAD (generalized anxiety disorder) 06/02/2017   Symptomatic cholelithiasis 06/02/2017   Elevated alkaline phosphatase level 04/09/2017   Sepsis due to pneumonia (HCC) 12/02/2016   Fine tremor 02/17/2016   COPD (chronic obstructive pulmonary disease) (HCC) 01/13/2016   Near syncope 12/01/2015   Aortic atherosclerosis (HCC) 12/01/2015   CAP (community acquired pneumonia) 01/05/2015   Ex-smoker 07/14/2014   Allergic rhinitis 03/03/2014   Anxiety 04/10/2013   Hoarseness 10/10/2012   Dysphonia 10/10/2012   Acute  respiratory failure (HCC) 08/08/2012   Acute encephalopathy 08/08/2012   COPD with acute exacerbation (HCC) 08/08/2012   Lactic acidosis 08/08/2012   Chronic respiratory failure (HCC) 03/27/2012   Takotsubo cardiomyopathy 01/31/2012   Chest pain 01/23/2012   TINGLING 05/26/2010   SKIN LESION 11/08/2009   ASCUS PAP 08/02/2009   VAG HIGH RISK HUMAN PAPILLOMAVIRUS DNA TEST POS 08/02/2009   DEPRESSION, CHRONIC with anxiety 06/22/2009   MICROSCOPIC HEMATURIA 06/22/2009   Smoking history 04/06/2009   BRUISE 04/06/2009   URI 10/09/2007   HYSTERECTOMY, PARTIAL, HX OF 07/01/2007   COPD exacerbation (HCC) 06/18/2007   OSTEOPENIA 06/18/2007   DEFICIENCY, 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    Past Surgical History:  Procedure Laterality Date   ABDOMINAL HYSTERECTOMY     CARDIAC CATHETERIZATION     CHOLECYSTECTOMY N/A 06/05/2017   Procedure: LAPAROSCOPIC CHOLECYSTECTOMY;  Surgeon: Nadeen Landau, MD;  Location: [facility] ORS;  Service: General;  Laterality: N/A;   FRACTURE SURGERY Left 07/2016   leg compound fraction   LEFT HEART CATHETERIZATION WITH CORONARY ANGIOGRAM N/A 01/23/2012   Procedure: LEFT HEART CATHETERIZATION WITH CORONARY ANGIOGRAM;  Surgeon: Dolores Patty, MD;  Location: [facility] CATH LAB;  Service: Cardiovascular;  Laterality: N/A;   vagina rectal repair     after childbirth    Prior to Admission medications   Medication Sig Start Date End Date Taking? Authorizing Provider  HYDROcodone-acetaminophen (NORCO/VICODIN) 5-325 MG tablet Take 1 tablet by mouth every 6 (six) hours as needed for moderate pain. 05/24/21 05/24/22 Yes Tommi Rumps,  PA-C  albuterol (PROVENTIL HFA;VENTOLIN HFA) 108 (90 Base) MCG/ACT inhaler Inhale 1-2 puffs into the lungs every 4 (four) hours as needed for wheezing or shortness of breath.    [provider]  albuterol (PROVENTIL) (2.5 MG/3ML) 0.083% nebulizer solution Take 3 mLs (2.5 mg total) by  nebulization every 6 (six) hours as needed for wheezing or shortness of breath. 07/10/19   Parrett, Virgel Bouquetammy S, NP  ALPRAZolam (XANAX) 0.25 MG tablet Take 0.25 mg by mouth 2 (two) times daily.    [provider]  escitalopram (LEXAPRO) 20 MG tablet Take 20 mg by mouth daily.    [provider]  fluticasone (FLONASE) 50 MCG/ACT nasal spray USE 2 SPRAYS IN EACH NOSTRIL ONCE DAILY 05/01/18   Lupita LeashMcQuaid, Douglas B, MD  guaiFENesin-codeine 100-10 MG/5ML syrup Take 5 mLs by mouth every 6 (six) hours as needed for cough. 11/05/20   Danford, Earl Liteshristopher P, MD  Ipratropium-Albuterol (COMBIVENT RESPIMAT) 20-100 MCG/ACT AERS respimat INHALE 1 PUFF BY MOUTH EVERY 6 HOURS AS NEEDED FOR WHEEZING 07/10/19   Parrett, Tammy S, NP  ipratropium-albuterol (DUONEB) 0.5-2.5 (3) MG/3ML SOLN Take 3 mLs by nebulization every 4 (four) hours as needed. 11/05/20   Danford, Earl Liteshristopher P, MD  mirtazapine (REMERON) 30 MG tablet Take 30 mg by mouth at bedtime.    [provider]  montelukast (SINGULAIR) 10 MG tablet Take 10 mg by mouth at bedtime.    [provider]  TRELEGY ELLIPTA 100-62.5-25 MCG/INH AEPB INHALE 1 PUFF ONCE DAILY -  NEED  APPT  FOR  FURTHER  REFILLS 03/20/21   Parrett, Virgel Bouquetammy S, NP    Allergies Prozac [fluoxetine], Budesonide, Buspar [buspirone], Levaquin [levofloxacin in d5w], Propranolol, and Zoloft [sertraline hcl]  Family History  Problem Relation Age of Onset   Heart attack Mother        deceased from massive MI at age 65   Liver disease Father        fatty liver; living, age 65   Stroke Brother        at age 65, living    Hypertension Brother        living, age 65    Social History Social History   Tobacco Use   Smoking status: Former    Packs/day: 1.00    Years: 30.00    Pack years: 30.00    Types: Cigarettes    Quit date: 01/21/2012    Years since quitting: 9.3   Smokeless tobacco: Never  Vaping Use   Vaping Use: Never used  Substance Use Topics   Alcohol use:  Yes    Comment: 1-2 beers per week   Drug use: No    Review of Systems Constitutional: No fever/chills Eyes: No visual changes. ENT: No sore throat. Cardiovascular: Denies chest pain. Respiratory: Denies shortness of breath.  Positive for cough. Gastrointestinal: No abdominal pain.  No nausea, no vomiting.  No diarrhea.   Genitourinary: Negative for dysuria. Musculoskeletal: Positive right rib pain. Skin: Negative for rash. Neurological: Negative for headaches, focal weakness or numbness.   ____________________________________________   PHYSICAL EXAM:  VITAL SIGNS: ED Triage Vitals  Enc Vitals Group     BP 05/24/21 1328 (!) 132/104     Pulse Rate 05/24/21 1328 86     Resp 05/24/21 1328 16     Temp 05/24/21 1328 98.2 F (36.8 C)     Temp Source 05/24/21 1328 Oral     SpO2 05/24/21 1328 97 %     Weight 05/24/21 1329  148 lb (67.1 kg)     Height 05/24/21 1329 5\' 5"  (1.651 m)     Head Circumference --      Peak Flow --      Pain Score 05/24/21 1329 10     Pain Loc --      Pain Edu? --      Excl. in GC? --     Constitutional: Alert and oriented. Well appearing and in no acute distress.  Patient currently wearing a nasal cannula with O2 administration.  She is able to talk in complete sentences without any shortness of breath.  Patient does look uncomfortable and holds her ribs with movement. Eyes: Conjunctivae are normal. PERRL. EOMI. Head: Atraumatic. Nose: No congestion/rhinnorhea. Mouth/Throat: Mucous membranes are moist.  Oropharynx non-erythematous. Neck: No stridor.   Cardiovascular: Normal rate, regular rhythm. Grossly normal heart sounds.  Good peripheral circulation. Respiratory: Normal respiratory effort.  No retractions. Lungs CTAB.  Right anterior lateral ribs at approximately 8, 9, 10th are tender to palpation.  No soft tissue edema or discoloration is noted in this area. Gastrointestinal: Soft and nontender. No distention.  Musculoskeletal: No lower  extremity tenderness nor edema.  No joint effusions. Neurologic:  Normal speech and language. No gross focal neurologic deficits are appreciated. No gait instability. Skin:  Skin is warm, dry and intact.  No rash present.  No skin discoloration or soft tissue edema is noted over the ribs. Psychiatric: Mood and affect are normal. Speech and behavior are normal.  ____________________________________________   LABS (all labs ordered are listed, but only abnormal results are displayed)  Labs Reviewed - No data to display ____________________________________________  ____________________________________________  RADIOLOGY 05/26/21, personally viewed and evaluated these images (plain radiographs) as part of my medical decision making, as well as reviewing the written report by the radiologist.   Official radiology report(s): DG Chest 2 View  Result Date: 05/24/2021 CLINICAL DATA:  Rib pain/cough. EXAM: RIGHT RIBS - 2 VIEW; CHEST - 2 VIEW COMPARISON:  11/03/2020. FINDINGS: Emphysema. Hyperinflation. Similar chronic areas of scarring without new consolidation. No visible pleural effusions or pneumothorax. Cardiomediastinal silhouette is within normal limits and similar to prior. Diffuse osteopenia. No evidence of a displaced rib fracture. Right upper quadrant clips. IMPRESSION: 1. Similar chronic emphysema and scarring without evidence of acute cardiopulmonary disease. 2. No evidence of a displaced rib fracture. Electronically Signed   By: 01/01/2021 MD   On: 05/24/2021 14:23   DG Ribs Unilateral Right  Result Date: 05/24/2021 CLINICAL DATA:  Rib pain/cough. EXAM: RIGHT RIBS - 2 VIEW; CHEST - 2 VIEW COMPARISON:  11/03/2020. FINDINGS: Emphysema. Hyperinflation. Similar chronic areas of scarring without new consolidation. No visible pleural effusions or pneumothorax. Cardiomediastinal silhouette is within normal limits and similar to prior. Diffuse osteopenia. No evidence of a  displaced rib fracture. Right upper quadrant clips. IMPRESSION: 1. Similar chronic emphysema and scarring without evidence of acute cardiopulmonary disease. 2. No evidence of a displaced rib fracture. Electronically Signed   By: 01/01/2021 MD   On: 05/24/2021 14:23    ____________________________________________   PROCEDURES  Procedure(s) performed (including Critical Care):  Procedures   ____________________________________________   INITIAL IMPRESSION / ASSESSMENT AND PLAN / ED COURSE  As part of my medical decision making, I reviewed the following data within the electronic MEDICAL RECORD NUMBER Notes from prior ED visits and [place] Controlled Substance Database  65 year old female presents to the ED with complaint of right rib pain from coughing.  This  is left her with increased pain with movement and she is unable to get comfortable.  She is worried about having pneumonia as the cause of her cough and rib pain.  He denies any recent injury to her ribs.  She was able to drive herself to the emergency department.  Over-the-counter medication is not giving her any relief.  Patient has an extensive history of COPD, pneumonia, acute and chronic respiratory failure and chronically on O2.  X-rays were negative for rib fracture or pneumonia.  Patient was reassured.  Because she did drive she is aware that she cannot be given any narcotic however hydrocodone was sent to her pharmacy and she is aware that she can take this when she gets home.  We discussed holding a pillow over her ribs when she feels the need to cough which will help with her pain.  She is to continue taking her regular medication prescribed by her doctor.  She is to follow-up with her PCP if any continued problems.  Return to the emergency department if any severe worsening of her symptoms such as difficulty breathing or shortness of breath.  ____________________________________________   FINAL CLINICAL IMPRESSION(S) / ED  DIAGNOSES  Final diagnoses:  Rib pain on right side  Musculoskeletal strain     ED Discharge Orders          Ordered    HYDROcodone-acetaminophen (NORCO/VICODIN) 5-325 MG tablet  Every 6 hours PRN        05/24/21 1431             Note:  This document was prepared using Dragon voice recognition software and may include unintentional dictation errors.    Tommi Rumps, PA-C 05/24/21 1459    Dionne Bucy, MD 05/24/21 1601

## 2021-05-24 NOTE — ED Triage Notes (Signed)
Pt here with rib pain x3 days. Pt denies injury but states that she has been coughing a lot recently. Pt denies N/V. Pt also states that her fingertips are starting to feel numb, more on the left side. Pt stable in triage.

## 2021-06-04 ENCOUNTER — Other Ambulatory Visit: Payer: Self-pay | Admitting: Adult Health

## 2021-06-04 DIAGNOSIS — J432 Centrilobular emphysema: Secondary | ICD-10-CM

## 2021-08-20 ENCOUNTER — Emergency Department: Payer: Medicare Other

## 2021-08-20 ENCOUNTER — Emergency Department
Admission: EM | Admit: 2021-08-20 | Discharge: 2021-08-20 | Disposition: A | Discharge status: Home / Self Care | Payer: Medicare Other | Attending: Emergency Medicine | Admitting: Emergency Medicine

## 2021-08-20 ENCOUNTER — Other Ambulatory Visit: Payer: Self-pay

## 2021-08-20 DIAGNOSIS — Z20822 Contact with and (suspected) exposure to covid-19: Secondary | ICD-10-CM | POA: Diagnosis not present

## 2021-08-20 DIAGNOSIS — Z87891 Personal history of nicotine dependence: Secondary | ICD-10-CM | POA: Insufficient documentation

## 2021-08-20 DIAGNOSIS — J441 Chronic obstructive pulmonary disease with (acute) exacerbation: Secondary | ICD-10-CM | POA: Diagnosis not present

## 2021-08-20 DIAGNOSIS — J9611 Chronic respiratory failure with hypoxia: Secondary | ICD-10-CM

## 2021-08-20 DIAGNOSIS — R0602 Shortness of breath: Secondary | ICD-10-CM | POA: Diagnosis present

## 2021-08-20 LAB — CBC WITH DIFFERENTIAL/PLATELET
Abs Immature Granulocytes: 0.03 10*3/uL (ref 0.00–0.07)
Basophils Absolute: 0 10*3/uL (ref 0.0–0.1)
Basophils Relative: 0 %
Eosinophils Absolute: 0 10*3/uL (ref 0.0–0.5)
Eosinophils Relative: 1 %
HCT: 36.9 % (ref 36.0–46.0)
Hemoglobin: 12.1 g/dL (ref 12.0–15.0)
Immature Granulocytes: 0 %
Lymphocytes Relative: 18 %
Lymphs Abs: 1.6 10*3/uL (ref 0.7–4.0)
MCH: 31 pg (ref 26.0–34.0)
MCHC: 32.8 g/dL (ref 30.0–36.0)
MCV: 94.6 fL (ref 80.0–100.0)
Monocytes Absolute: 0.5 10*3/uL (ref 0.1–1.0)
Monocytes Relative: 6 %
Neutro Abs: 6.3 10*3/uL (ref 1.7–7.7)
Neutrophils Relative %: 75 %
Platelets: 497 10*3/uL — ABNORMAL HIGH (ref 150–400)
RBC: 3.9 MIL/uL (ref 3.87–5.11)
RDW: 15 % (ref 11.5–15.5)
WBC: 8.5 10*3/uL (ref 4.0–10.5)
nRBC: 0 % (ref 0.0–0.2)

## 2021-08-20 LAB — BASIC METABOLIC PANEL
Anion gap: 7 (ref 5–15)
BUN: 13 mg/dL (ref 8–23)
CO2: 34 mmol/L — ABNORMAL HIGH (ref 22–32)
Calcium: 9.5 mg/dL (ref 8.9–10.3)
Chloride: 98 mmol/L (ref 98–111)
Creatinine, Ser: 0.8 mg/dL (ref 0.44–1.00)
GFR, Estimated: >60 mL/min (ref >60)
Glucose, Bld: 135 mg/dL — ABNORMAL HIGH (ref 70–99)
Potassium: 3.8 mmol/L (ref 3.5–5.1)
Sodium: 139 mmol/L (ref 135–145)

## 2021-08-20 LAB — RESP PANEL BY RT-PCR (FLU A&B, COVID) ARPGX2
Influenza A by PCR: NEGATIVE
Influenza B by PCR: NEGATIVE
SARS Coronavirus 2 by RT PCR: NEGATIVE

## 2021-08-20 LAB — BRAIN NATRIURETIC PEPTIDE: B Natriuretic Peptide: 17.2 pg/mL (ref 0.0–100.0)

## 2021-08-20 LAB — D-DIMER, QUANTITATIVE: D-Dimer, Quant: 0.84 ug/mL-FEU — ABNORMAL HIGH (ref 0.00–0.50)

## 2021-08-20 MED ORDER — PREDNISONE 10 MG PO TABS: ORAL_TABLET | ORAL | 0 refills | Status: AC

## 2021-08-20 MED ORDER — IOHEXOL 350 MG/ML SOLN
75.0000 mL | Freq: Once | INTRAVENOUS | Status: AC | PRN
  Administered 2021-08-20: 75 mL via INTRAVENOUS

## 2021-08-20 MED ORDER — ALBUTEROL SULFATE (2.5 MG/3ML) 0.083% IN NEBU
5.0000 mg | INHALATION_SOLUTION | Freq: Once | RESPIRATORY_TRACT | Status: AC
  Administered 2021-08-20: 5 mg via RESPIRATORY_TRACT
  Filled 2021-08-20: qty 6

## 2021-08-20 MED ORDER — METHYLPREDNISOLONE SODIUM SUCC 125 MG IJ SOLR
125.0000 mg | Freq: Once | INTRAMUSCULAR | Status: AC
  Administered 2021-08-20: 125 mg via INTRAVENOUS
  Filled 2021-08-20: qty 2

## 2021-08-20 MED ORDER — IPRATROPIUM-ALBUTEROL 0.5-2.5 (3) MG/3ML IN SOLN
3.0000 mL | Freq: Once | RESPIRATORY_TRACT | Status: AC
  Administered 2021-08-20: 3 mL via RESPIRATORY_TRACT
  Filled 2021-08-20: qty 3

## 2021-08-20 NOTE — ED Notes (Signed)
Pt states feels "better" and would like to go home. Informed ED provider.

## 2021-08-20 NOTE — ED Triage Notes (Signed)
First Nurse Note:  Arrives via [facility].  Hx COPD and emphysema C/O SOB x 1 week.  Home oxygen 3l-4l,  Patient C/O worsening exertional dyspnea.  20 g PIV to LAC, 125 mg solumedrol, 10 mg Albuterol, and 0.5 mg Atrovent given PTA  Sats on 3l/[place] 93%..100% on 8l neb.  Vs wnl.  CBG:  124

## 2021-08-20 NOTE — ED Notes (Signed)
Pt to ED for SOB, states was 1 pack/day smoker for 30 years, quit in 2013, has emphysema. Pt normally wears 3L oxygen at home. Pt has felt increasingly SOB over last week, especially last 2 days. Usually uses 2 pillows to sleep, now using 4 and only getting 3 hours sleep/night.

## 2021-08-20 NOTE — ED Provider Notes (Signed)
[facility] Emergency Department Provider Note  ____________________________________________  Time seen: Approximately 3:30 PM  I have reviewed the triage vital signs and the nursing notes.   HISTORY  Chief Complaint Shortness of Breath    HPI Heidi [name] is a 65 y.o. female with a past history of COPD on 3 L nasal cannula oxygen at home, GERD, CAD status post NSTEMI who comes ED complaining of worsening shortness of breath for the past week.  Worse with walking, no alleviating factors.  Symptoms are constant and severe.  Denies chest pain.  Also had some cough.  A week ago she was started on azithromycin and cough has improved.  No fever.  Patient received Solu-Medrol and nebs from EMS without resolution of her symptoms.   Past Medical History:  Diagnosis Date   Anxiety    COPD (chronic obstructive pulmonary disease) (HCC)    GERD (gastroesophageal reflux disease)    Hypotension    limiting med titration   NSTEMI (non-ST elevated myocardial infarction) (HCC) 2013   12/2011 with normal coronaries possibly secondary to Takotsubo (EF 30-35% by echo), occurred following two episodes of acute respiratory distress   Pneumonia 11/2016   history of   Takotsubo syndrome 4/13     Patient Active Problem List   Diagnosis Date Noted   GAD (generalized anxiety disorder) 06/02/2017   Symptomatic cholelithiasis 06/02/2017   Elevated alkaline phosphatase level 04/09/2017   Sepsis due to pneumonia (HCC) 12/02/2016   Fine tremor 02/17/2016   COPD (chronic obstructive pulmonary disease) (HCC) 01/13/2016   Near syncope 12/01/2015   Aortic atherosclerosis (HCC) 12/01/2015   CAP (community acquired pneumonia) 01/05/2015   Ex-smoker 07/14/2014   Allergic rhinitis 03/03/2014   Anxiety 04/10/2013   Hoarseness 10/10/2012   Dysphonia 10/10/2012   Acute respiratory failure (HCC) 08/08/2012   Acute encephalopathy 08/08/2012   COPD with acute exacerbation (HCC)  08/08/2012   Lactic acidosis 08/08/2012   Chronic respiratory failure (HCC) 03/27/2012   Takotsubo cardiomyopathy 01/31/2012   Chest pain 01/23/2012   TINGLING 05/26/2010   SKIN LESION 11/08/2009   ASCUS PAP 08/02/2009   VAG HIGH RISK HUMAN PAPILLOMAVIRUS DNA TEST POS 08/02/2009   DEPRESSION, CHRONIC with anxiety 06/22/2009   MICROSCOPIC HEMATURIA 06/22/2009   Smoking history 04/06/2009   BRUISE 04/06/2009   URI 10/09/2007   HYSTERECTOMY, PARTIAL, HX OF 07/01/2007   COPD exacerbation (HCC) 06/18/2007   OSTEOPENIA 06/18/2007   DEFICIENCY, 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     Past Surgical History:  Procedure Laterality Date   ABDOMINAL HYSTERECTOMY     CARDIAC CATHETERIZATION     CHOLECYSTECTOMY N/A 06/05/2017   Procedure: LAPAROSCOPIC CHOLECYSTECTOMY;  Surgeon: Nadeen Landau, MD;  Location: [facility] ORS;  Service: General;  Laterality: N/A;   FRACTURE SURGERY Left 07/2016   leg compound fraction   LEFT HEART CATHETERIZATION WITH CORONARY ANGIOGRAM N/A 01/23/2012   Procedure: LEFT HEART CATHETERIZATION WITH CORONARY ANGIOGRAM;  Surgeon: Dolores Patty, MD;  Location: [facility] CATH LAB;  Service: Cardiovascular;  Laterality: N/A;   vagina rectal repair     after childbirth     Prior to Admission medications   Medication Sig Start Date End Date Taking? Authorizing Provider  predniSONE (DELTASONE) 10 MG tablet Take 5 tablets (50 mg total) by mouth daily for 4 days, THEN 4 tablets (40 mg total) daily for 4 days, THEN 3 tablets (30 mg total) daily for 4 days. Then resume your usual long-term dose of 20mg   per day.. 08/20/21 09/01/21 Yes Sharman Cheek, MD  albuterol (PROVENTIL HFA;VENTOLIN HFA) 108 (90 Base) MCG/ACT inhaler Inhale 1-2 puffs into the lungs every 4 (four) hours as needed for wheezing or shortness of breath.    [provider]  albuterol (PROVENTIL) (2.5 MG/3ML) 0.083% nebulizer solution Take 3 mLs (2.5 mg total) by  nebulization every 6 (six) hours as needed for wheezing or shortness of breath. 07/10/19   Parrett, Virgel Bouquet, NP  ALPRAZolam (XANAX) 0.25 MG tablet Take 0.25 mg by mouth 2 (two) times daily.    [provider]  escitalopram (LEXAPRO) 20 MG tablet Take 20 mg by mouth daily.    [provider]  fluticasone (FLONASE) 50 MCG/ACT nasal spray USE 2 SPRAYS IN EACH NOSTRIL ONCE DAILY 05/01/18   Lupita Leash, MD  guaiFENesin-codeine 100-10 MG/5ML syrup Take 5 mLs by mouth every 6 (six) hours as needed for cough. 11/05/20   Danford, Earl Lites, MD  HYDROcodone-acetaminophen (NORCO/VICODIN) 5-325 MG tablet Take 1 tablet by mouth every 6 (six) hours as needed for moderate pain. 05/24/21 05/24/22  Tommi Rumps, PA-C  Ipratropium-Albuterol (COMBIVENT RESPIMAT) 20-100 MCG/ACT AERS respimat INHALE 1 PUFF BY MOUTH EVERY 6 HOURS AS NEEDED FOR WHEEZING 07/10/19   Parrett, Tammy S, NP  ipratropium-albuterol (DUONEB) 0.5-2.5 (3) MG/3ML SOLN Take 3 mLs by nebulization every 4 (four) hours as needed. 11/05/20   Danford, Earl Lites, MD  mirtazapine (REMERON) 30 MG tablet Take 30 mg by mouth at bedtime.    [provider]  montelukast (SINGULAIR) 10 MG tablet Take 10 mg by mouth at bedtime.    [provider]  TRELEGY ELLIPTA 100-62.5-25 MCG/INH AEPB INHALE 1 PUFF ONCE DAILY -  NEED  APPT  FOR  FURTHER  REFILLS 03/20/21   Parrett, Virgel Bouquet, NP     Allergies Prozac [fluoxetine], Budesonide, Buspar [buspirone], Levaquin [levofloxacin in d5w], Propranolol, and Zoloft [sertraline hcl]   Family History  Problem Relation Age of Onset   Heart attack Mother        deceased from massive MI at age 75   Liver disease Father        fatty liver; living, age 72   Stroke Brother        at age 54, living    Hypertension Brother        living, age 72    Social History Social History   Tobacco Use   Smoking status: Former    Packs/day: 1.00    Years: 30.00    Pack years: 30.00     Types: Cigarettes    Quit date: 01/21/2012    Years since quitting: 9.5   Smokeless tobacco: Never  Vaping Use   Vaping Use: Never used  Substance Use Topics   Alcohol use: Yes    Comment: 1-2 beers per week   Drug use: No    Review of Systems  Constitutional:   No fever or chills.  ENT:   No sore throat. No rhinorrhea. Cardiovascular:   No chest pain or syncope. Respiratory:   Positive shortness of breath and cough. Gastrointestinal:   Negative for abdominal pain, vomiting and diarrhea.  Musculoskeletal:   Negative for focal pain or swelling All other systems reviewed and are negative except as documented above in ROS and HPI.  ____________________________________________   PHYSICAL EXAM:  VITAL SIGNS: ED Triage Vitals  Enc Vitals Group     BP 08/20/21 1248 130/78     Pulse Rate 08/20/21 1247 (!)  106     Resp 08/20/21 1247 (!) 25     Temp 08/20/21 1303 98.2 F (36.8 C)     Temp Source 08/20/21 1303 Oral     SpO2 08/20/21 1247 96 %     Weight 08/20/21 1250 145 lb (65.8 kg)     Height 08/20/21 1250 5\' 5"  (1.651 m)     Head Circumference --      Peak Flow --      Pain Score 08/20/21 1250 0     Pain Loc --      Pain Edu? --      Excl. in GC? --     Vital signs reviewed, nursing assessments reviewed.   Constitutional:   Alert and oriented. Non-toxic appearance. Eyes:   Conjunctivae are normal. EOMI. PERRL. ENT      Head:   Normocephalic and atraumatic.      Nose:   Wearing a mask.      Mouth/Throat:   Wearing a mask.      Neck:   No meningismus. Full ROM. Hematological/Lymphatic/Immunilogical:   No cervical lymphadenopathy. Cardiovascular:   RRR. Symmetric bilateral radial and DP pulses.  No murmurs. Cap refill less than 2 seconds. Respiratory:   Mild tachypnea.  Diminished breath sounds diffusely consistent with emphysema.  There is prolonged expiratory phase and diffuse expiratory wheezing.  Deep breath provokes coughing. Gastrointestinal:   Soft and  nontender. Non distended. There is no CVA tenderness.  No rebound, rigidity, or guarding. Genitourinary:   deferred Musculoskeletal:   Normal range of motion in all extremities. No joint effusions.  No lower extremity tenderness.  No edema. Neurologic:   Normal speech and language.  Motor grossly intact. No acute focal neurologic deficits are appreciated.  Skin:    Skin is warm, dry and intact. No rash noted.  No petechiae, purpura, or bullae.  ____________________________________________    LABS (pertinent positives/negatives) (all labs ordered are listed, but only abnormal results are displayed) Labs Reviewed  CBC WITH DIFFERENTIAL/PLATELET - Abnormal; Notable for the following components:      Result Value   Platelets 497 (*)    All other components within normal limits  BASIC METABOLIC PANEL - Abnormal; Notable for the following components:   CO2 34 (*)    Glucose, Bld 135 (*)    All other components within normal limits  D-DIMER, QUANTITATIVE - Abnormal; Notable for the following components:   D-Dimer, Quant 0.84 (*)    All other components within normal limits  RESP PANEL BY RT-PCR (FLU A&B, COVID) ARPGX2  BRAIN NATRIURETIC PEPTIDE   ____________________________________________   EKG  Interpreted by me Sinus tachycardia rate 111.  Normal axis intervals QRS ST segments and T waves  ____________________________________________    RADIOLOGY  DG Chest 2 View  Result Date: 08/20/2021 CLINICAL DATA:  65 year old female with history of shortness of breath. EXAM: CHEST - 2 VIEW COMPARISON:  05/24/2021 FINDINGS: The mediastinal contours are within normal limits. No cardiomegaly. Similar appearing apical hyperinflation and mild flattening of the hemidiaphragms bilaterally. The lungs are clear bilaterally without evidence of focal consolidation, pleural effusion, or pneumothorax. No acute osseous abnormality. IMPRESSION: No acute cardiopulmonary process. Similar appearing  emphysematous changes. Electronically Signed   By: 05/26/2021 M.D.   On: 08/20/2021 13:43   CT Angio Chest PE W and/or Wo Contrast  Result Date: 08/20/2021 CLINICAL DATA:  65 year old female with shortness of breath, pulmonary embolism suspected. EXAM: CT ANGIOGRAPHY CHEST WITH CONTRAST TECHNIQUE: Multidetector CT imaging  of the chest was performed using the standard protocol during bolus administration of intravenous contrast. Multiplanar CT image reconstructions and MIPs were obtained to evaluate the vascular anatomy. CONTRAST:  Seventy-five mL Omnipaque 350, intravenous COMPARISON:  07/09/2020 FINDINGS: Cardiovascular: Satisfactory opacification of the pulmonary arteries to the segmental level. No evidence of pulmonary embolism. Normal heart size. No pericardial effusion. Again seen is an aberrant right subclavian artery, retroesophageal. Coarse atherosclerotic calcification about shared bilateral common carotid artery origin without evidence of definite flow-limiting stenosis. Scattered atherosclerotic calcification about the thoracic aorta. Mediastinum/Nodes: No enlarged mediastinal, hilar, or axillary lymph nodes. Thyroid gland, trachea, and esophagus demonstrate no significant findings. Lungs/Pleura: Similar appearing severe upper lobe predominant emphysematous changes. Similar appearing right apical scarring. No suspicious pulmonary nodules, focal consolidation, pneumothorax, or pleural effusion. Upper Abdomen: Diffusely decreased attenuation of the hepatic parenchyma. The remaining visualized upper abdomen is within normal limits. Musculoskeletal: Similar appearing exaggerated thoracic kyphosis. No acute osseous abnormality or aggressive appearing osseous lesion. Review of the MIP images confirms the above findings. IMPRESSION: Vascular: 1. No evidence of pulmonary embolism. 2. 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 3.  Aortic Atherosclerosis (ICD10-I70.0). Non-Vascular: 1.  Similar appearing severe emphysema (ICD10-J43.9) without acute intrathoracic abnormality. 2. Hepatic steatosis. Marliss Coots, MD [facility] Electronically Signed   By: Marliss Coots M.D.   On: 08/20/2021 14:16    ____________________________________________   PROCEDURES Procedures  ____________________________________________  DIFFERENTIAL DIAGNOSIS   Pneumonia, pneumothorax, pleural effusion, pulmonary edema, COPD exacerbation, pulmonary embolism  CLINICAL IMPRESSION / ASSESSMENT AND PLAN / ED COURSE  Medications ordered in the ED: Medications  iohexol (OMNIPAQUE) 350 MG/ML injection 75 mL (75 mLs Intravenous Contrast Given 08/20/21 1401)  methylPREDNISolone sodium succinate (SOLU-MEDROL) 125 mg/2 mL injection 125 mg (125 mg Intravenous Given 08/20/21 1608)  ipratropium-albuterol (DUONEB) 0.5-2.5 (3) MG/3ML nebulizer solution 3 mL (3 mLs Nebulization Given 08/20/21 1606)  albuterol (PROVENTIL) (2.5 MG/3ML) 0.083% nebulizer solution 5 mg (5 mg Nebulization Given 08/20/21 1606)    Pertinent labs & imaging results that were available during my care of the patient were reviewed by me and considered in my medical decision making (see chart for details).  CHERYAL GOLDFINE was evaluated in Emergency Department on 08/20/2021 for the symptoms described in the history of present illness. She was evaluated in the context of the global COVID-19 pandemic, which necessitated consideration that the patient might be at risk for infection with the SARS-CoV-2 virus that causes COVID-19. Institutional protocols and algorithms that pertain to the evaluation of patients at risk for COVID-19 are in a state of rapid change based on information released by regulatory bodies including the CDC and federal and state organizations. These policies and algorithms were followed during the patient's care in the ED.   Patient presents with shortness of breath  over the past week.  Clinically this is consistent with a COPD exacerbation.  With her tachypnea and tachycardia, a D-dimer was obtained which was elevated, and a CT angiogram is negative for pulmonary embolism.  No evidence of pneumonia, pneumothorax, pleural effusion, pulmonary edema.  After additional steroids and bronchodilators in the ED, patient is feeling better and wishes to go home.  Her oxygenation is normal on 2 L nasal cannula, no evidence of pneumonia, already on antibiotics.  Stable for discharge with increased prednisone taper on top of her usual daily dose of 20 mg.  Doubt ACS, dissection, pericarditis.      ____________________________________________   FINAL CLINICAL IMPRESSION(S) /  ED DIAGNOSES    Final diagnoses:  COPD exacerbation (HCC)  Chronic respiratory failure with hypoxia [facility])     ED Discharge Orders          Ordered    predniSONE (DELTASONE) 10 MG tablet        08/20/21 1729            Portions of this note were generated with dragon dictation software. Dictation errors may occur despite best attempts at proofreading.    Sharman Cheek, MD 08/20/21 [phone]

## 2021-08-20 NOTE — Discharge Instructions (Addendum)
Continue using your medications as prescribed at home, and increase your use of albuterol nebulizer as needed.  I have written a prescription for a higher dose course of prednisone over the next 2 weeks.  Please follow-up with your pulmonologist or primary care doctor this coming week for further monitoring of your symptoms.

## 2021-08-20 NOTE — ED Triage Notes (Signed)
Pt comes ems from home with SOB. Pt starting feeling sick/SOB after her most recent covid booster on nov 10th. Pt usually uses 3L [place] and turned it up to 4L [place] the past week. Pt was placed on antibiotics and prednisone recently. States no relief. Can't sleep because of the SOB. Pt is getting breathing treatments from EMS at this time. Pt denies fevers. Pt speaking in full sentences while breathing treatment is going.

## 2021-08-20 NOTE — ED Provider Notes (Signed)
Emergency Medicine Provider Triage Evaluation Note  Heidi [name] , a 65 y.o. female  was evaluated in triage.  Pt complains of shortness ob breath. History of COPD, uses 3l oxygen normally but has increased to 4l without improvement. Shortness of breath increased since last COVID booster on 08/08/21.  Review of Systems  Positive: Shortness of breath Negative: Fever  Physical Exam  There were no vitals taken for this visit. Gen:   Awake, no distress   Resp:  Normal effort  MSK:   Moves extremities without difficulty  Other:    Medical Decision Making  Medically screening exam initiated at 12:47 PM.  Appropriate orders placed.  Heidi [name] was informed that the remainder of the evaluation will be completed by another provider, this initial triage assessment does not replace that evaluation, and the importance of remaining in the ED until their evaluation is complete.   Chinita Pester, FNP 08/20/21 1249    Merwyn Katos, MD 08/20/21 [phone]

## 2021-08-29 ENCOUNTER — Telehealth: Payer: Self-pay | Admitting: Acute Care

## 2021-08-29 DIAGNOSIS — Z87891 Personal history of nicotine dependence: Secondary | ICD-10-CM

## 2021-08-29 NOTE — Telephone Encounter (Signed)
I spoke with pt and advised that the CT angio chest that she had done in the ER on 08/20/21 showed no concerning pulmonary nodules therefore we will wait and repeat her lung cancer screening Ct in 07/2022. Pt verbalized understanding and is aware that we will contact her close to that time to schedule the CT. Note sent to Dr Merlinda Frederick to make her aware.

## 2021-09-01 ENCOUNTER — Other Ambulatory Visit: Payer: Self-pay

## 2021-09-01 ENCOUNTER — Encounter: Payer: Self-pay | Admitting: Dermatology

## 2021-09-01 ENCOUNTER — Ambulatory Visit (INDEPENDENT_AMBULATORY_CARE_PROVIDER_SITE_OTHER): Payer: Medicare Other | Admitting: Dermatology

## 2021-09-01 DIAGNOSIS — T148XXA Other injury of unspecified body region, initial encounter: Secondary | ICD-10-CM

## 2021-09-01 DIAGNOSIS — S91001A Unspecified open wound, right ankle, initial encounter: Secondary | ICD-10-CM

## 2021-09-01 MED ORDER — MUPIROCIN 2 % EX OINT: 1.0000 "application " | TOPICAL_OINTMENT | Freq: Every day | CUTANEOUS | 0 refills | Status: DC

## 2021-09-01 NOTE — Progress Notes (Signed)
   New Patient Visit  Subjective  Heidi [name] is a 65 y.o. female who presents for the following: irregular skin lesion (Of the R lat ankle - present for about a month. Patient doesn't recall anything happening to the area she just noticed it was sore and tender to the touch. Patient concerned it may be a spider bite. ).   The following portions of the chart were reviewed this encounter and updated as appropriate:   Tobacco  Allergies  Meds  Problems  Med Hx  Surg Hx  Fam Hx      Review of Systems:  No other skin or systemic complaints except as noted in HPI or Assessment and Plan.  Objective  Well appearing patient in no apparent distress; mood and affect are within normal limits.  A focused examination was performed including the lower legs. Relevant physical exam findings are noted in the Assessment and Plan.  R lat malleolus Small ulceration approx 3 mm    Assessment & Plan  Open wound R lat malleolus  Trauma vs arterial ulcer vs venous ulcer or other Patient already scheduled for ABI to assess for arterial insufficiency  Clean with warm water and soap, pat dry then apply Mupirocin 2% ointment to aa QD. Then cover with a bandage. If worsening or if not healed within 6 weeks RTC. Watch for symptoms of fever, chills, drainage, or spreading erythema.   mupirocin ointment (BACTROBAN) 2 % - R lat malleolus Apply 1 application topically daily. Apply to wound QD until healed.  Return if symptoms worsen or fail to improve.  Heidi [name], CMA, am acting as scribe for Darden Dates, MD .   Documentation: I have reviewed the above documentation for accuracy and completeness, and I agree with the above.  Darden Dates, MD

## 2021-09-01 NOTE — Patient Instructions (Signed)

## 2021-09-15 LAB — EXTERNAL GENERIC LAB PROCEDURE: COLOGUARD: NEGATIVE

## 2021-09-15 LAB — COLOGUARD: COLOGUARD: NEGATIVE

## 2021-11-07 ENCOUNTER — Other Ambulatory Visit: Payer: Self-pay

## 2021-11-07 ENCOUNTER — Ambulatory Visit (INDEPENDENT_AMBULATORY_CARE_PROVIDER_SITE_OTHER): Payer: Commercial Managed Care - HMO | Admitting: Dermatology

## 2021-11-07 DIAGNOSIS — L89512 Pressure ulcer of right ankle, stage 2: Secondary | ICD-10-CM | POA: Diagnosis not present

## 2021-11-07 NOTE — Progress Notes (Signed)
° °  Follow-Up Visit   Subjective  Heidi [name] is a 66 y.o. female who presents for the following: Trauma vs arterial ulcer vs venous ulcer vs other (R lat malleolus, f/u, painful, non healing, mupirocin oint qd).  Not healing.  Painful when she is laying down in bed.  She always sleeps on right side.   The following portions of the chart were reviewed this encounter and updated as appropriate:       Review of Systems:  No other skin or systemic complaints except as noted in HPI or Assessment and Plan.  Objective  Well appearing patient in no apparent distress; mood and affect are within normal limits.  A focused examination was performed including right ankle. Relevant physical exam findings are noted in the Assessment and Plan.  R lat malleolus Trace ankle edema 5.0 mm x 4.0 mm x 1.76mm ulceration with yellow fibrinous base and surrounding erythema       Assessment & Plan  Pressure injury of right ankle, stage 2 (HCC) R lat malleolus  Due to pressure from sleeping exclusively on R side, with component of stasis.  Mechanical debridement today with Puracyn followed by Mupirocin ointment and regular duoderm  Cont Duoderm q 2-3 days, cleaning wound with puracyn, then apply Mupirocin oint followed by duoderm Recommend compression sock qd Recommend pressure bootie/heel protector on R heel/ankle when sleeping since she always sleep on same side Discussed leg ulcers can take 2-3 months to heal, but will need to minimize nightitme pressure to R ankle  ABI test was normal   Return in about 6 weeks (around 12/19/2021) for f/u ulcer.   Documentation: I have reviewed the above documentation for accuracy and completeness, and I agree with the above.  Brendolyn Patty MD

## 2021-11-07 NOTE — Patient Instructions (Addendum)
If You Need Anything After Your Visit ° °If you have any questions or concerns for your doctor, please call our main line at [phone] and press option 4 to reach your doctor's medical assistant. If no one answers, please leave a voicemail as directed and we will return your call as soon as possible. Messages left after 4 pm will be answered the following business day.  ° °You may also send us a message via MyChart. We typically respond to MyChart messages within 1-2 business days. ° °For prescription refills, please ask your pharmacy to contact our office. Our fax number is [phone]. ° °If you have an urgent issue when the clinic is closed that cannot wait until the next business day, you can page your doctor at the number below.   ° °Please note that while we do our best to be available for urgent issues outside of office hours, we are not available 24/7.  ° °If you have an urgent issue and are unable to reach us, you may choose to seek medical care at your doctor's office, retail clinic, urgent care center, or emergency room. ° °If you have a medical emergency, please immediately call 911 or go to the emergency department. ° °Pager Numbers ° °- Dr. Kowalski: [phone] ° °- Dr. Moye: [phone] ° °- Dr. Stewart: [phone] ° °In the event of inclement weather, please call our main line at [phone] for an update on the status of any delays or closures. ° °Dermatology Medication Tips: °Please keep the boxes that topical medications come in in order to help keep track of the instructions about where and how to use these. Pharmacies typically print the medication instructions only on the boxes and not directly on the medication tubes.  ° °If your medication is too expensive, please contact our office at [phone] option 4 or send us a message through MyChart.  ° °We are unable to tell what your co-pay for medications will be in advance as this is different depending on your insurance coverage.  However, we may be able to find a substitute medication at lower cost or fill out paperwork to get insurance to cover a needed medication.  ° °If a prior authorization is required to get your medication covered by your insurance company, please allow us 1-2 business days to complete this process. ° °Drug prices often vary depending on where the prescription is filled and some pharmacies may offer cheaper prices. ° °The website www.goodrx.com contains coupons for medications through different pharmacies. The prices here do not account for what the cost may be with help from insurance (it may be cheaper with your insurance), but the website can give you the price if you did not use any insurance.  °- You can print the associated coupon and take it with your prescription to the pharmacy.  °- You may also stop by our office during regular business hours and pick up a GoodRx coupon card.  °- If you need your prescription sent electronically to a different pharmacy, notify our office through [facility] My[facility] or by phone at [phone] option 4. ° ° ° ° °Si Usted Necesita Algo Después de Su Visita ° °También puede enviarnos un mensaje a través de MyChart. Por lo general respondemos a los mensajes de MyChart en el transcurso de 1 a 2 días hábiles. ° °Para renovar recetas, por favor pida a su farmacia que se ponga en contacto con nuestra oficina. Nuestro número de fax es el [phone]. ° °Si tiene   un asunto urgente cuando la clnica est cerrada y que no puede esperar hasta el siguiente da hbil, puede llamar/localizar a su doctor(a) al nmero que aparece a continuacin.   Por favor, tenga en cuenta que aunque hacemos todo lo posible para estar disponibles para asuntos urgentes fuera del horario de [place], no estamos disponibles las 24 horas del da, los 7 [address]  [address] de la [place].   Si tiene un problema urgente y no puede comunicarse con nosotros, puede optar por buscar atencin mdica  en el consultorio de su  doctor(a), en una clnica privada, en un centro de atencin urgente o en una sala de emergencias.  Si tiene Engineer, drilling, por favor llame inmediatamente al 911 o vaya a la sala de emergencias.  Nmeros de bper  - Dr. Gwen Pounds: [phone]  - Dra. Moye: [phone]  - Dra. Roseanne Reno: [phone]  En caso de inclemencias del [place], por favor llame a Lacy Duverney principal al [phone] para una actualizacin sobre el [place] de cualquier retraso o cierre.  Consejos para la medicacin en dermatologa: Por favor, guarde las cajas en las que vienen los medicamentos de uso tpico para ayudarle a seguir las instrucciones sobre dnde y cmo usarlos. Las farmacias generalmente imprimen las instrucciones del medicamento slo en las cajas y no directamente en los tubos del [place].   Si su medicamento es muy caro, por favor, pngase en contacto con Rolm Gala llamando al [phone] y presione la opcin 4 o envenos un mensaje a travs de Clinical cytogeneticist.   No podemos decirle cul ser su copago por los medicamentos por adelantado ya que esto es diferente dependiendo de la cobertura de su seguro. Sin embargo, es posible que podamos encontrar un medicamento sustituto a Audiological scientist un formulario para que el seguro cubra el medicamento que se considera necesario.   Si se requiere una autorizacin previa para que su compaa de seguros [place] su medicamento, por favor permtanos de 1 a 2 das hbiles para completar [address]  Los precios de los medicamentos varan con frecuencia dependiendo del Environmental consultant de dnde se surte la receta y alguna farmacias pueden ofrecer precios ms baratos.  El sitio web www.goodrx.com tiene cupones para medicamentos de Health and safety inspector. Los precios aqu no tienen en cuenta lo que podra costar con la ayuda del seguro (puede ser ms barato con su seguro), pero el sitio web puede darle el precio si no utiliz Tourist information centre manager.  - Puede imprimir el cupn  correspondiente y llevarlo con su receta a la farmacia.  - Tambin puede pasar por nuestra oficina durante el horario de atencin regular y Education officer, museum una tarjeta de cupones de GoodRx.  - Si necesita que su receta se enve electrnicamente a una farmacia diferente, informe a nuestra oficina a travs de MyChart de [place] o por telfono llamando al [phone] y presione la opcin 4.   Pressure booties, heel protector from [facility], for protection of ankle when sleeping.  Change duoderm on ankle every 2-3 days.  When changing duoderm clean with puracyn spray, apply a thin coat of mupirocin ointment then apply duoderm

## 2021-11-11 IMAGING — CR DG CHEST 2V
2 series · 2 of 2 positions shown · non-contrast
Comparison: 05/24/2021

CLINICAL DATA: 65-year-old female with history of shortness of
breath.

EXAM:
CHEST - 2 VIEW

[chest lat]
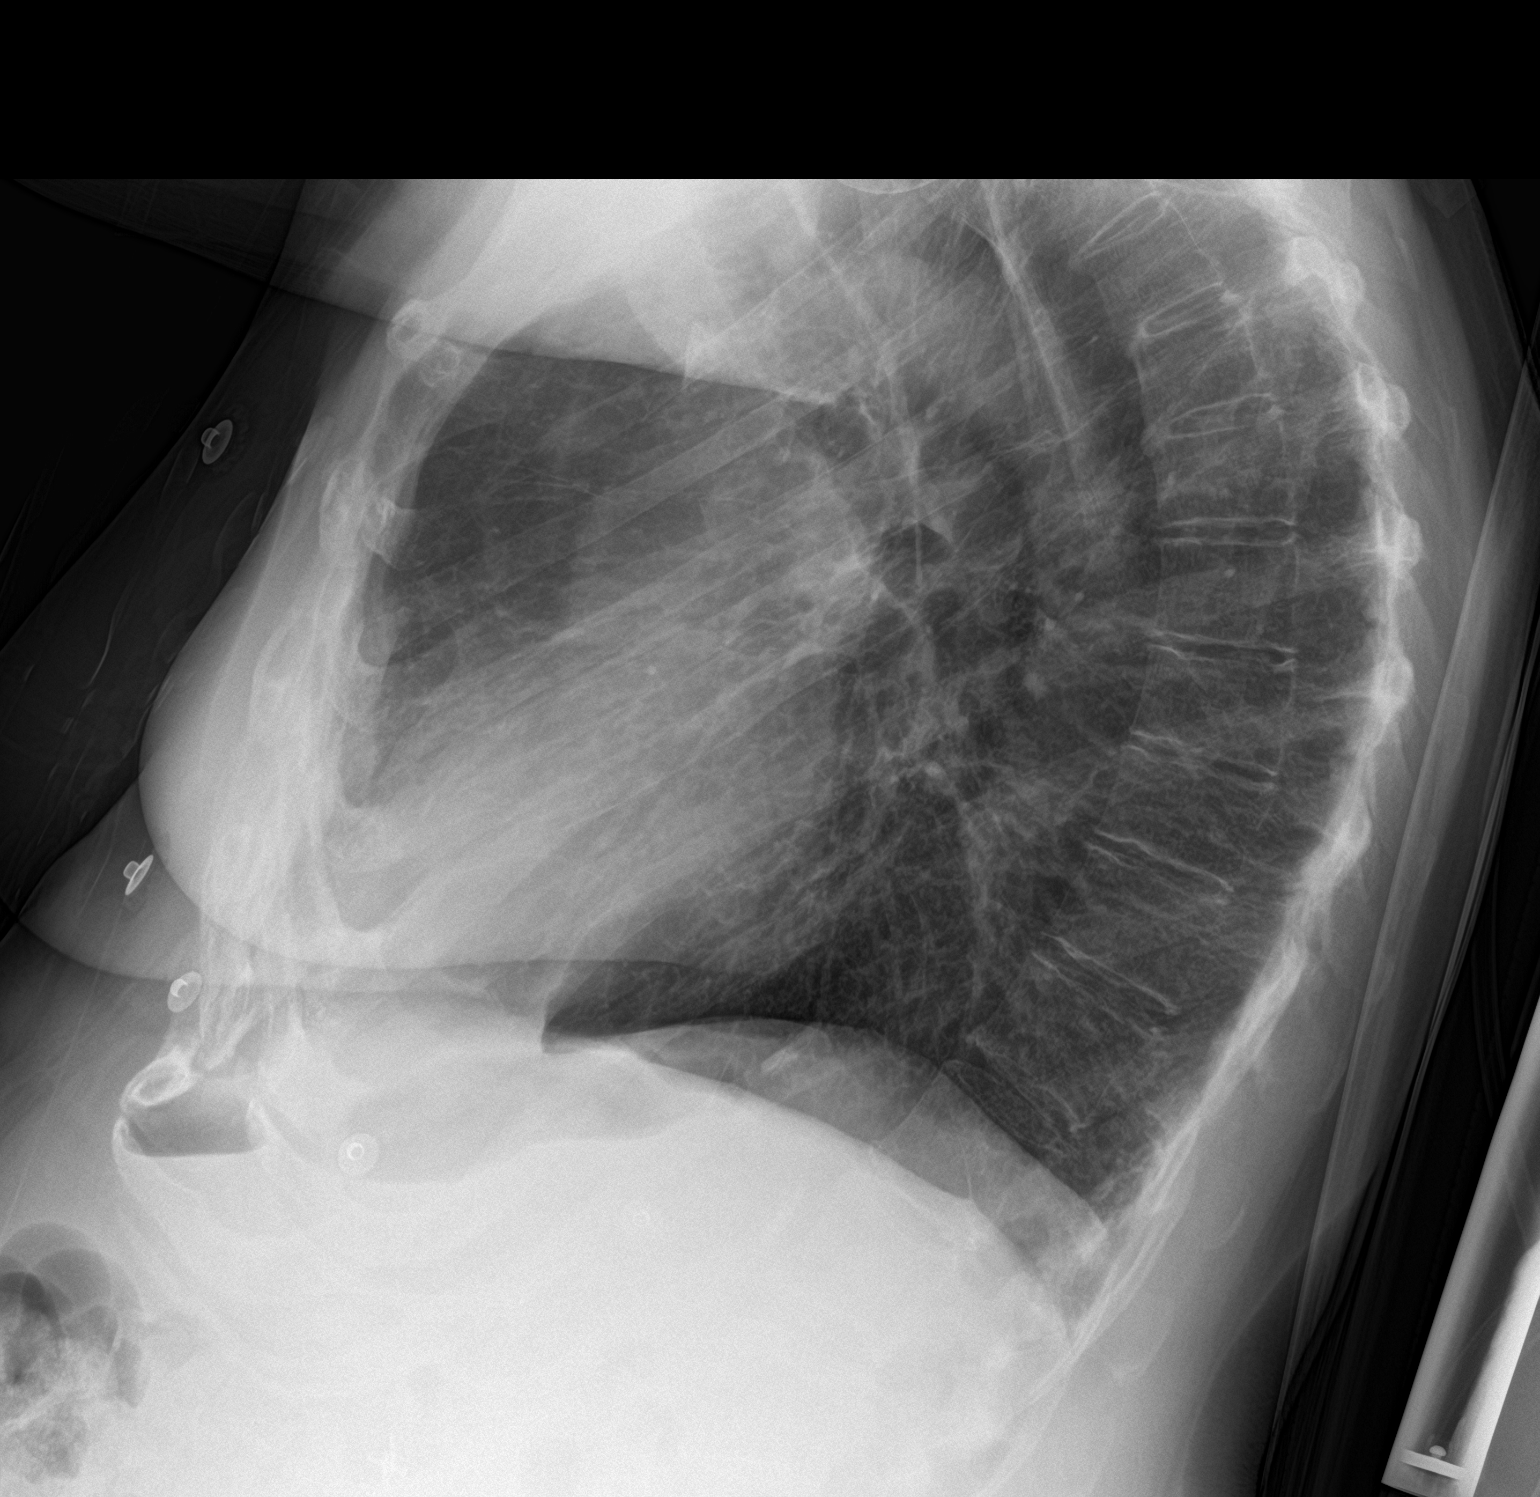

[chest ap]
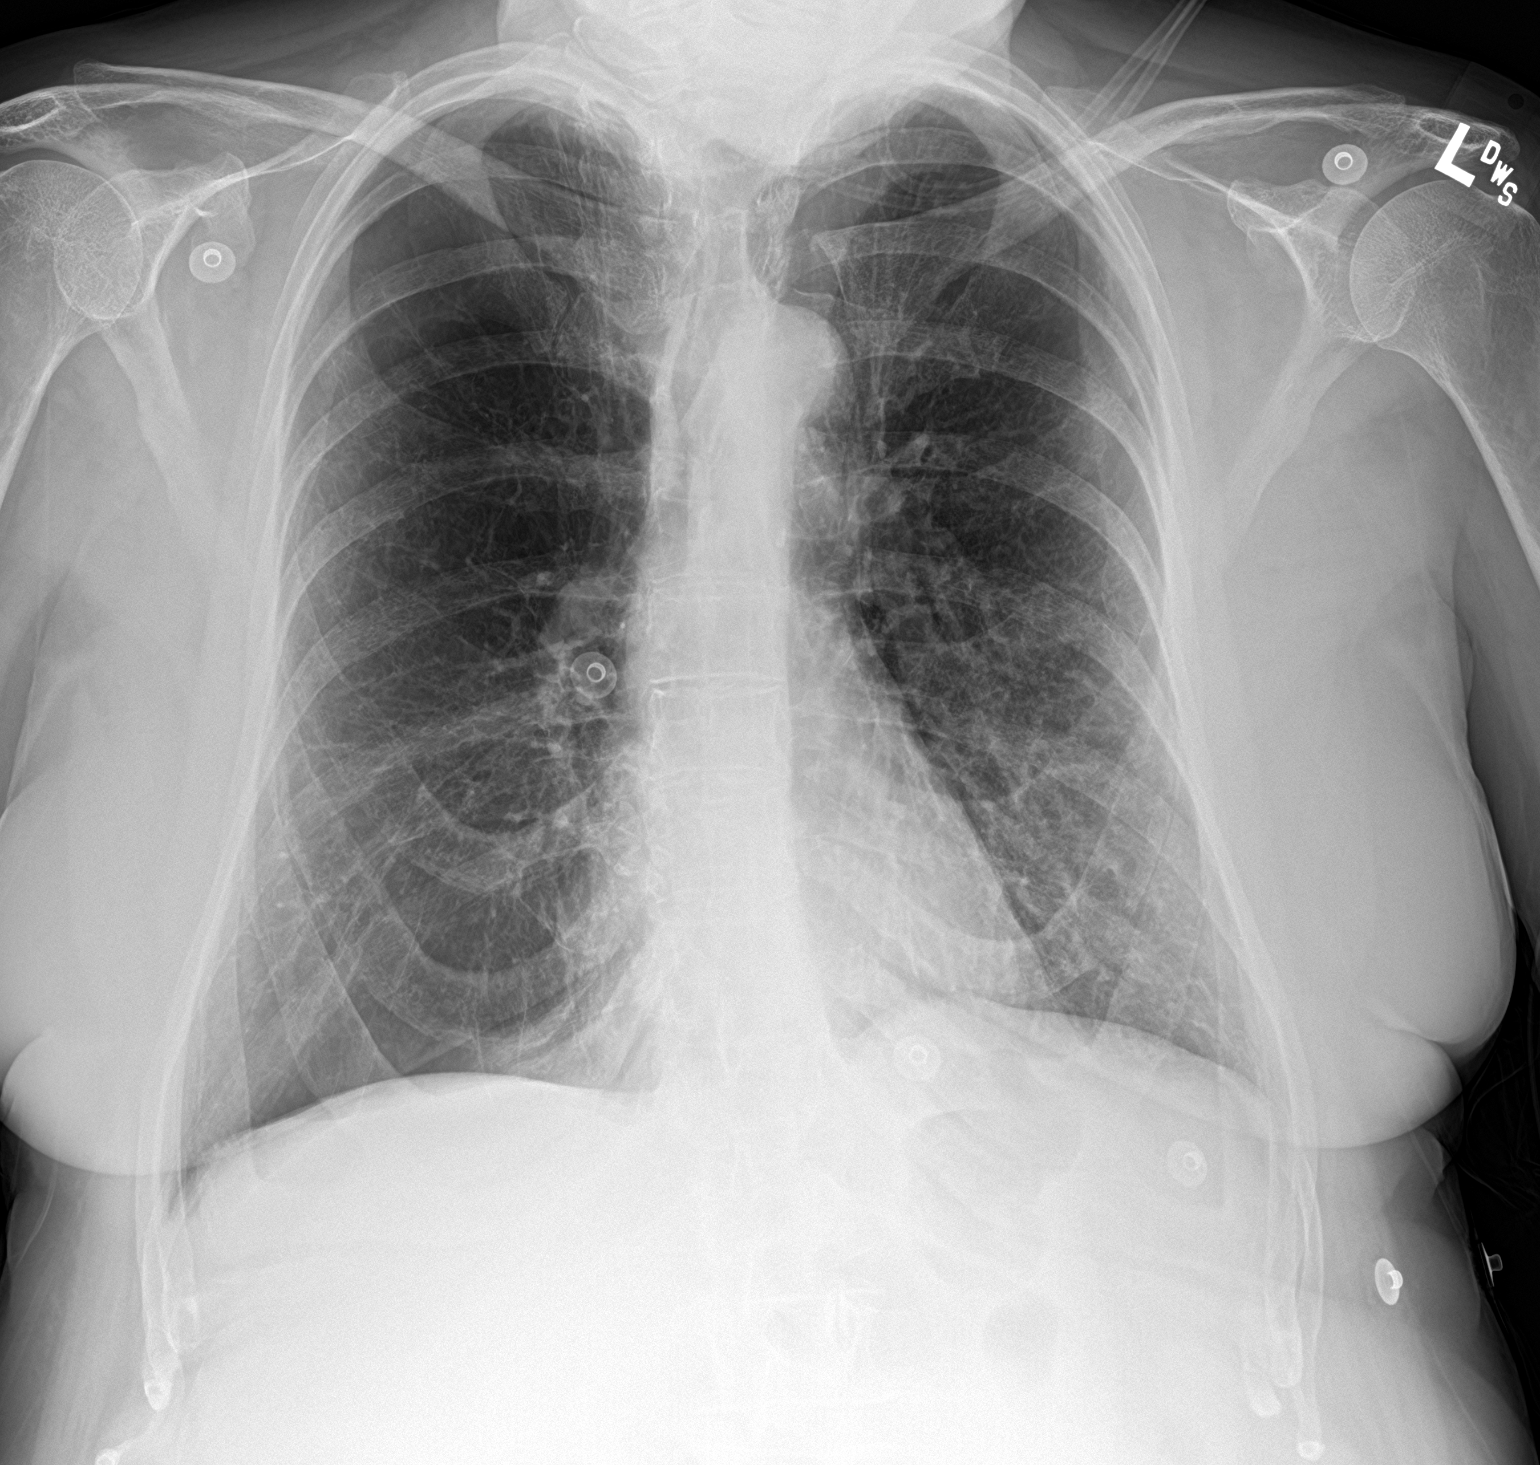

[2 of 2 positions shown; findings below may reference images not displayed]

FINDINGS: The mediastinal contours are within normal limits. No cardiomegaly.
Similar appearing apical hyperinflation and mild flattening of the
hemidiaphragms bilaterally. The lungs are clear bilaterally without
evidence of focal consolidation, pleural effusion, or pneumothorax.
No acute osseous abnormality.
IMPRESSION: No acute cardiopulmonary process. Similar appearing emphysematous
changes.

## 2021-12-27 ENCOUNTER — Other Ambulatory Visit: Payer: Self-pay

## 2021-12-27 ENCOUNTER — Ambulatory Visit (INDEPENDENT_AMBULATORY_CARE_PROVIDER_SITE_OTHER): Payer: Medicare Other | Admitting: Dermatology

## 2021-12-27 DIAGNOSIS — L89512 Pressure ulcer of right ankle, stage 2: Secondary | ICD-10-CM

## 2021-12-27 NOTE — Patient Instructions (Addendum)
Continue cleaning with Puracyn followed by Mupirocin ointment and Duoderm regular. Change every 2-3 days.  May use topical lidocaine as needed for pain.  If You Need Anything After Your Visit  If you have any questions or concerns for your doctor, please call our main line at [phone] and press option 4 to reach your doctor's medical assistant. If no one answers, please leave a voicemail as directed and we will return your call as soon as possible. Messages left after 4 pm will be answered the following business day.   You may also send [place] a message via MyChart. We typically respond to MyChart messages within 1-2 business days.  For prescription refills, please ask your pharmacy to contact our office. Our fax number is [phone].  If you have an urgent issue when the clinic is closed that cannot wait until the next business day, you can page your doctor at the number below.    Please note that while we do our best to be available for urgent issues outside of office hours, we are not available 24/7.   If you have an urgent issue and are unable to reach [place], you may choose to seek medical care at your doctor's office, retail clinic, urgent care center, or emergency room.  If you have a medical emergency, please immediately call 911 or go to the emergency department.  Pager Numbers  - Dr. Gwen Pounds: [phone]  - Dr. Neale Burly: [phone]  - Dr. Roseanne Reno: [phone]  In the event of inclement weather, please call our main line at [phone] for an update on the status of any delays or closures.  Dermatology Medication Tips: Please keep the boxes that topical medications come in in order to help keep track of the instructions about where and how to use these. Pharmacies typically print the medication instructions only on the boxes and not directly on the medication tubes.   If your medication is too expensive, please contact our office at [phone] option 4 or send [place] a message  through MyChart.   We are unable to tell what your co-pay for medications will be in advance as this is different depending on your insurance coverage. However, we may be able to find a substitute medication at lower cost or fill out paperwork to get insurance to cover a needed medication.   If a prior authorization is required to get your medication covered by your insurance company, please allow [place] 1-2 business days to complete this process.  Drug prices often vary depending on where the prescription is filled and some pharmacies may offer cheaper prices.  The website www.goodrx.com contains coupons for medications through different pharmacies. The prices here do not account for what the cost may be with help from insurance (it may be cheaper with your insurance), but the website can give you the price if you did not use any insurance.  - You can print the associated coupon and take it with your prescription to the pharmacy.  - You may also stop by our office during regular business hours and pick up a GoodRx coupon card.  - If you need your prescription sent electronically to a different pharmacy, notify our office through [facility] or by phone at [phone] option 4.     Si Usted Necesita Algo Despus de Su Visita  Tambin puede enviarnos un mensaje a travs de Clinical cytogeneticist. Por lo general respondemos a los mensajes de MyChart en el transcurso de 1 a 2 das hbiles.  Para renovar  recetas, por favor pida a su farmacia que se ponga en contacto con nuestra oficina. Annie Sable de fax es [place] [phone].  Si tiene un asunto urgente cuando la clnica est cerrada y que no puede esperar hasta el siguiente da hbil, puede llamar/localizar a su doctor(a) al nmero que aparece a continuacin.   Por favor, tenga en cuenta que aunque hacemos todo lo posible para estar disponibles para asuntos urgentes fuera del horario de [place], no estamos disponibles las 24 horas del da, los 7 [address]  [address] de  la [place].   Si tiene un problema urgente y no puede comunicarse con nosotros, puede optar por buscar atencin mdica  en el consultorio de su doctor(a), en una clnica privada, en un centro de atencin urgente o en una sala de emergencias.  Si tiene Engineer, drilling, por favor llame inmediatamente al 911 o vaya a la sala de emergencias.  Nmeros de bper  - Dr. Gwen Pounds: [phone]  - Dra. Moye: [phone]  - Dra. Roseanne Reno: [phone]  En caso de inclemencias del [place], por favor llame a Lacy Duverney principal al [phone] para una actualizacin sobre el [place] de cualquier retraso o cierre.  Consejos para la medicacin en dermatologa: Por favor, guarde las cajas en las que vienen los medicamentos de uso tpico para ayudarle a seguir las instrucciones sobre dnde y cmo usarlos. Las farmacias generalmente imprimen las instrucciones del medicamento slo en las cajas y no directamente en los tubos del [place].   Si su medicamento es muy caro, por favor, pngase en contacto con Rolm Gala llamando al [phone] y presione la opcin 4 o envenos un mensaje a travs de Clinical cytogeneticist.   No podemos decirle cul ser su copago por los medicamentos por adelantado ya que esto es diferente dependiendo de la cobertura de su seguro. Sin embargo, es posible que podamos encontrar un medicamento sustituto a Audiological scientist un formulario para que el seguro cubra el medicamento que se considera necesario.   Si se requiere una autorizacin previa para que su compaa de seguros [place] su medicamento, por favor permtanos de 1 a 2 das hbiles para completar [address]  Los precios de los medicamentos varan con frecuencia dependiendo del Environmental consultant de dnde se surte la receta y alguna farmacias pueden ofrecer precios ms baratos.  El sitio web www.goodrx.com tiene cupones para medicamentos de Health and safety inspector. Los precios aqu no tienen en cuenta lo que podra costar con la ayuda  del seguro (puede ser ms barato con su seguro), pero el sitio web puede darle el precio si no utiliz Tourist information centre manager.  - Puede imprimir el cupn correspondiente y llevarlo con su receta a la farmacia.  - Tambin puede pasar por nuestra oficina durante el horario de atencin regular y Education officer, museum una tarjeta de cupones de GoodRx.  - Si necesita que su receta se enve electrnicamente a una farmacia diferente, informe a nuestra oficina a travs de MyChart de [place] o por telfono llamando al [phone] y presione la opcin 4.

## 2021-12-27 NOTE — Progress Notes (Signed)
° °  Follow-Up Visit   Subjective  Heidi [name] is a 66 y.o. female who presents for the following: Ulcer (R lateral malleolus, 6 week follow-up. Some improvement. Using Puracyn, mupirocin, and DuoDerm every 3 days. She has not used compression sock. Using boot to reduce pressure while sleeping). Prior Xray done was normal.  The following portions of the chart were reviewed this encounter and updated as appropriate:       Review of Systems:  No other skin or systemic complaints except as noted in HPI or Assessment and Plan.  Objective  Well appearing patient in no apparent distress; mood and affect are within normal limits.  A focused examination was performed including right ankle. Relevant physical exam findings are noted in the Assessment and Plan.  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 Condition is symptomatic/ bothersome to patient. Not currently at goal.   Ulcer due to pressure from sleeping exclusively on R side, with component of stasis. Patient is on oxygen due to emphysema so may be some decreased perfusion from that as well. Recent xray was normal.   Some improvement noted, slightly smaller.  Gentle debridement today (with significant pain) with Puracyn followed by Mupirocin ointment and Regular Duoderm.  Continue cleaning with Puracyn, followed by Mupirocin ointment and Regular DuoDerm. Change q 2-3 days. Continue pressure/cushioned bootie to help relieve pressure while sleeping. Try sleeping in recliner on back to avoid sleeping on side.  May try OTC topical lidocaine around ulcer as needed for pain, but do not apply before bed, since pain occurs as a result of pressure and is good reminder for her to change positions.  If not improving, consider referral to wound care clinic     Return in  about 2 months (around 02/24/2022) for ulcer.  IJamesetta Orleans, CMA, am acting as scribe for Brendolyn Patty, MD . Documentation: I have reviewed the above documentation for accuracy and completeness, and I agree with the above.  Brendolyn Patty MD

## 2022-02-07 ENCOUNTER — Other Ambulatory Visit: Payer: Self-pay | Admitting: Physician Assistant

## 2022-02-07 DIAGNOSIS — Z1231 Encounter for screening mammogram for malignant neoplasm of breast: Secondary | ICD-10-CM

## 2022-02-21 ENCOUNTER — Ambulatory Visit (INDEPENDENT_AMBULATORY_CARE_PROVIDER_SITE_OTHER): Payer: Medicare Other | Admitting: Dermatology

## 2022-02-21 DIAGNOSIS — L89512 Pressure ulcer of right ankle, stage 2: Secondary | ICD-10-CM | POA: Diagnosis not present

## 2022-02-21 NOTE — Progress Notes (Signed)
? ?  Follow-Up Visit ?  ?Subjective  ?Heidi [name] is a 66 y.o. female who presents for the following: Follow-up. ? ?Patient here for 2 month follow-up ulcer of the right lat ankle. Area is slowly improving, per patient. She has quit using the DuoDerm patches. She puts mupirocin 2% ointment to the center of ulcer daily and leaves open. She covers ulcer at night with bandage and pressure bootie to help relieve pressure while sleeping. She has an upcoming appointment with infectious disease, referred by pulmonologist. ? ?The following portions of the chart were reviewed this encounter and updated as appropriate:  ?  ?  ? ?Review of Systems:  No other skin or systemic complaints except as noted in HPI or Assessment and Plan. ? ?Objective  ?Well appearing patient in no apparent distress; mood and affect are within normal limits. ? ?A focused examination was performed including right ankle. Relevant physical exam findings are noted in the Assessment and Plan. ? ?Right Lat Ankle ?5.0 x 4.0 x 1.0 mm yellow/white crusted ulceration with surrounding mild erythema.  Tender to touch.  No change from previous photo. ? ? ? ?Assessment & Plan  ?Pressure injury of right ankle, stage 2 ([place]) ?Right Lat Ankle ? ?Chronic and persistent condition with duration or expected duration over one year. Condition is symptomatic/ bothersome to patient. Not currently at goal and no improvement compared to previous photo and exam.   ?  ?Ulcer due to pressure from sleeping exclusively on R side, with component of stasis. Patient is on oxygen due to emphysema so may be some decreased perfusion from that as well. Prior xray was normal.  ? ?Recommend restarting cleaning with Puracyn, followed by Mupirocin 2% Ointment and Regular DuoDerm patches to ulcer on right ankle. Replace q 2-3 days. Refill sent to [facility]. Continue wearing cushioned bootie overnight. ? ?Wound cleansed with gentle debridement, mupirocin applied, and ulcer covered with  Duoderm ? ?Patient has upcoming appointment with infectious disease in 2 weeks. D/c DuoDerm patches a few days before appt.  ? ?Referral sent to [facility]. ? ? ?Ambulatory referral to Wound Clinic - Right Lat Ankle ? ? ?Return if symptoms worsen or fail to improve. ? ?I, Jamesetta Orleans, CMA, am acting as scribe for Brendolyn Patty, MD . ? ?Documentation: I have reviewed the above documentation for accuracy and completeness, and I agree with the above. ? ?Brendolyn Patty MD  ? ?

## 2022-02-21 NOTE — Patient Instructions (Signed)

## 2022-03-08 ENCOUNTER — Encounter: Payer: Medicare Other | Attending: Internal Medicine | Admitting: Internal Medicine

## 2022-03-08 ENCOUNTER — Other Ambulatory Visit: Payer: Self-pay | Admitting: Internal Medicine

## 2022-03-08 ENCOUNTER — Ambulatory Visit
Admission: RE | Admit: 2022-03-08 | Discharge: 2022-03-08 | Disposition: A | Discharge status: Home / Self Care | Payer: Medicare Other | Source: Ambulatory Visit | Attending: Internal Medicine | Admitting: Internal Medicine

## 2022-03-08 DIAGNOSIS — J449 Chronic obstructive pulmonary disease, unspecified: Secondary | ICD-10-CM | POA: Diagnosis not present

## 2022-03-08 DIAGNOSIS — S81801A Unspecified open wound, right lower leg, initial encounter: Secondary | ICD-10-CM

## 2022-03-08 DIAGNOSIS — L97518 Non-pressure chronic ulcer of other part of right foot with other specified severity: Secondary | ICD-10-CM | POA: Insufficient documentation

## 2022-03-08 DIAGNOSIS — I739 Peripheral vascular disease, unspecified: Secondary | ICD-10-CM | POA: Diagnosis not present

## 2022-03-08 NOTE — Progress Notes (Signed)
Heidi [name], Heidi [name] ([id number]) ?Visit Report for 03/08/2022 ?Allergy List Details ?Patient Name: Heidi [name], Heidi [name] ?Date of Service: 03/08/2022 12:45 PM ?Medical Record Number: [id number] ?Patient Account Number: [id number] ?Date of Birth/Sex: Mar 09, 1956 (66 y.o. F) ?Treating RN: Angelina Pih ?Primary Care Luvinia Lucy: Reino Kent Other Clinician: ?Referring Malaiyah Achorn: Willeen Niece ?Treating Shekelia Boutin/Extender: Geralyn Corwin ?Weeks in Treatment: 0 ?Allergies ?Active Allergies ?Prozac ?budesonide ?BuSpar ?Levaquin ?propranolol ?Zoloft ?Allergy Notes ?Electronic Signature(s) ?Signed: 03/08/2022 4:11:25 PM By: Angelina Pih ?Entered By: Angelina Pih on 03/08/2022 13:49:12 ?Heidi [name], Heidi [name] ([id number]) ?-------------------------------------------------------------------------------- ?Arrival Information Details ?Patient Name: Heidi, [name] ?Date of Service: 03/08/2022 12:45 PM ?Medical Record Number: [id number] ?Patient Account Number: [id number] ?Date of Birth/Sex: 1956-08-28 (66 y.o. F) ?Treating RN: Angelina Pih ?Primary Care Karinne Schmader: Reino Kent Other Clinician: ?Referring Troy Kanouse: Willeen Niece ?Treating Terran Hollenkamp/Extender: Geralyn Corwin ?Weeks in Treatment: 0 ?Visit Information ?Patient Arrived: Ambulatory ?Arrival Time: 12:54 ?Accompanied By: self ?Transfer Assistance: None ?Patient Identification Verified: Yes ?Secondary Verification Process Completed: Yes ?Electronic Signature(s) ?Signed: 03/08/2022 4:11:25 PM By: Angelina Pih ?Entered By: Angelina Pih on 03/08/2022 12:55:03 ?Heidi [name], Heidi [name] ([id number]) ?-------------------------------------------------------------------------------- ?Clinic Level of Care Assessment Details ?Patient Name: Heidi, [name] ?Date of Service: 03/08/2022 12:45 PM ?Medical Record Number: [id number] ?Patient Account Number: [id number] ?Date of Birth/Sex: 30-Oct-1956 (66 y.o. F) ?Treating RN: Angelina Pih ?Primary Care Brinae Woods: Reino Kent Other  Clinician: ?Referring Vincent Streater: Willeen Niece ?Treating Loletha Bertini/Extender: Geralyn Corwin ?Weeks in Treatment: 0 ?Clinic Level of Care Assessment Items ?TOOL 1 Quantity Score ?[]  - Use when EandM and Procedure is performed on INITIAL visit 0 ?ASSESSMENTS - Nursing Assessment / Reassessment ?X - General Physical Exam (combine w/ comprehensive assessment (listed just below) when performed on new ?1 20 ?pt. evals) ?X- 1 25 ?Comprehensive Assessment (HX, ROS, Risk Assessments, Wounds Hx, etc.) ?ASSESSMENTS - Wound and Skin Assessment / Reassessment ?[]  - Dermatologic / Skin Assessment (not related to wound area) 0 ?ASSESSMENTS - Ostomy and/or Continence Assessment and Care ?[]  - Incontinence Assessment and Management 0 ?[]  - 0 ?Ostomy Care Assessment and Management (repouching, etc.) ?PROCESS - Coordination of Care ?X - Simple Patient / Family Education for ongoing care 1 15 ?[]  - 0 ?Complex (extensive) Patient / Family Education for ongoing care ?[]  - 0 ?Staff obtains Consents, Records, Test Results / Process Orders ?[]  - 0 ?Staff telephones HHA, Nursing Homes / Clarify orders / etc ?[]  - 0 ?Routine Transfer to another Facility (non-emergent condition) ?[]  - 0 ?Routine Hospital Admission (non-emergent condition) ?X- 1 15 ?New Admissions / / Ordering NPWT, Apligraf, etc. ?[]  - 0 ?Emergency Hospital Admission (emergent condition) ?PROCESS - Special Needs ?[]  - Pediatric / Minor Patient Management 0 ?[]  - 0 ?Isolation Patient Management ?[]  - 0 ?Hearing / Language / Visual special needs ?[]  - 0 ?Assessment of Community assistance (transportation, D/C planning, etc.) ?[]  - 0 ?Additional assistance / Altered mentation ?[]  - 0 ?Support Surface(s) Assessment (bed, cushion, seat, etc.) ?INTERVENTIONS - Miscellaneous ?[]  - External ear exam 0 ?[]  - 0 ?Patient Transfer (multiple staff / / Similar devices) ?[]  - 0 ?Simple Staple / Suture removal (25 or less) ?[]  - 0 ?Complex Staple / Suture  removal (26 or more) ?[]  - 0 ?Hypo/Hyperglycemic Management (do not check if billed separately) ?X- 1 15 ?Ankle / Brachial Index (ABI) - do not check if billed separately ?Has the patient been seen at the hospital within the last three years: Yes ?Total Score: 90 ?Level Of Care: New/Established - Level ?3 ?Heidi [name], Heidi T ( ) ?Electronic  Signature(s) ?Signed: 03/08/2022 4:11:25 PM By: Angelina Pih ?Entered By: Angelina Pih on 03/08/2022 16:09:26 ?Heidi [name], Heidi [name] ([id number]) ?-------------------------------------------------------------------------------- ?Encounter Discharge Information Details ?Patient Name: Heidi, [name] ?Date of Service: 03/08/2022 12:45 PM ?Medical Record Number: [id number] ?Patient Account Number: [id number] ?Date of Birth/Sex: Nov 21, 1955 (66 y.o. F) ?Treating RN: Angelina Pih ?Primary Care Anshul Meddings: Reino Kent Other Clinician: ?Referring Nyjai Graff: Willeen Niece ?Treating Marquavis Hannen/Extender: Geralyn Corwin ?Weeks in Treatment: 0 ?Encounter Discharge Information Items Post Procedure Vitals ?Discharge Condition: Stable ?Temperature (?F): 97.7 ?Ambulatory Status: Ambulatory ?Pulse (bpm): 98 ?Discharge Destination: Home ?Respiratory Rate (breaths/min): 20 ?Transportation: Private Auto ?Blood Pressure (mmHg): 149/86 ?Accompanied By: self ?Schedule Follow-up Appointment: Yes ?Clinical Summary of Care: Patient Declined ?Electronic Signature(s) ?Signed: 03/08/2022 2:46:32 PM By: Angelina Pih ?Entered By: Angelina Pih on 03/08/2022 14:46:32 ?Heidi [name], Heidi [name] ([id number]) ?-------------------------------------------------------------------------------- ?Lower Extremity Assessment Details ?Patient Name: Heidi [name], Heidi [name] ?Date of Service: 03/08/2022 12:45 PM ?Medical Record Number: [id number] ?Patient Account Number: [id number] ?Date of Birth/Sex: 26-May-1956 (66 y.o. F) ?Treating RN: Angelina Pih ?Primary Care Chloeann Alfred: Reino Kent Other Clinician: ?Referring  Dore Oquin: Willeen Niece ?Treating Oswell Say/Extender: Geralyn Corwin ?Weeks in Treatment: 0 ?Edema Assessment ?Assessed: [Left: No] [Right: No] ?Edema: [Left: N] [Right: o] ?Calf ?Left: Right: ?Point of Measurement: 35 cm From Medial Instep ?Ankle ?Left: Right: ?Point of Measurement: 11 cm From Medial Instep 19 cm ?Knee To Floor ?Left: Right: ?From Medial Instep 30.8 cm ?Vascular Assessment ?Pulses: ?Dorsalis Pedis ?Palpable: [Right:Yes] ?Posterior Tibial ?Palpable: [Right:Yes] ?Blood Pressure: ?Brachial: [Right:149] ?Dorsalis Pedis: 100 ?Ankle: ?Posterior Tibial: 150 ?Ankle Brachial Index: [Right:1.01] ?Electronic Signature(s) ?Signed: 03/08/2022 4:11:25 PM By: Angelina Pih ?Entered By: Angelina Pih on 03/08/2022 13:16:01 ?Heidi [name], JIMMERSON TMarland [name] ([id number]) ?-------------------------------------------------------------------------------- ?Multi Wound Chart Details ?Patient Name: TRIVA, HUEBER ?Date of Service: 03/08/2022 12:45 PM ?Medical Record Number: [id number] ?Patient Account Number: [id number] ?Date of Birth/Sex: [birth year] (66 y.o. F) ?Treating RN: Angelina Pih ?Primary Care Eupha Lobb: Reino Kent Other Clinician: ?Referring Letitia Sabala: Willeen Niece ?Treating Rino Hosea/Extender: Geralyn Corwin ?Weeks in Treatment: 0 ?Vital Signs ?Height(in): 65 ?Pulse(bpm): 98 ?Weight(lbs): 150 ?Blood Pressure(mmHg): 149/86 ?Body Mass Index(BMI): 25 ?Temperature(??F): 97.7 ?Respiratory Rate(breaths/min): 20 ?Photos: [N/A:N/A] ?Wound Location: Right, Lateral Ankle N/A N/A ?Wounding Event: Gradually Appeared N/A N/A ?Primary Etiology: Venous Leg Ulcer N/A N/A ?Comorbid History: Chronic Obstructive Pulmonary N/A N/A ?Disease (COPD), Hypotension ?Date Acquired: 11/30/2021 N/A N/A ?Weeks of Treatment: 0 N/A N/A ?Wound Status: Open N/A N/A ?Wound Recurrence: No N/A N/A ?Measurements L x W x D (cm) 0.2x0.2x0.2 N/A N/A ?Area (cm?) : 0.031 N/A N/A ?Volume (cm?) : 0.006 N/A N/A ?Classification: Full Thickness Without Exposed  N/A N/A ?Support Structures ?Exudate Amount: Medium N/A N/A ?Exudate Type: Serosanguineous N/A N/A ?Exudate Color: red, brown N/A N/A ?Granulation Amount: None Present (0%) N/A N/A ?Necrotic Amount: Large

## 2022-03-08 NOTE — Progress Notes (Signed)
Heidi [name], Heidi [name] Kitchen ([id number]) ?Visit Report for 03/08/2022 ?Abuse Risk Screen Details ?Patient Name: Heidi [name], Heidi [name] ?Date of Service: 03/08/2022 12:45 PM ?Medical Record Number: [id number] ?Patient Account Number: [id number] ?Date of Birth/Sex: Dec 23, 1955 (66 y.o. F) ?Treating RN: Angelina Pih ?Primary Care Neno Hohensee: Reino Kent Other Clinician: ?Referring Kristal Perl: Willeen Niece ?Treating Finley Dinkel/Extender: Geralyn Corwin ?Weeks in Treatment: 0 ?Abuse Risk Screen Items ?Answer ?ABUSE RISK SCREEN: ?Has anyone close to you tried to hurt or harm you recentlyo No ?Do you feel uncomfortable with anyone in your familyo No ?Has anyone forced you do things that you didnot want to doo No ?Electronic Signature(s) ?Signed: 03/08/2022 4:11:25 PM By: Angelina Pih ?Entered By: Angelina Pih on 03/08/2022 13:04:45 ?Heidi [name], Heidi [name] Kitchen ([id number]) ?-------------------------------------------------------------------------------- ?Activities of Daily Living Details ?Patient Name: Heidi [name], Heidi [name] ?Date of Service: 03/08/2022 12:45 PM ?Medical Record Number: [id number] ?Patient Account Number: [id number] ?Date of Birth/Sex: April 18, 1956 (66 y.o. F) ?Treating RN: Angelina Pih ?Primary Care Betina Puckett: Reino Kent Other Clinician: ?Referring Ariele Vidrio: Willeen Niece ?Treating Brynna Dobos/Extender: Geralyn Corwin ?Weeks in Treatment: 0 ?Activities of Daily Living Items ?Answer ?Activities of Daily Living (Please select one for each item) ?Drive Automobile Completely Able ?Take Medications Completely Able ?Use Telephone Completely Able ?Care for Appearance Completely Able ?Use Toilet Completely Able ?Bath / Shower Completely Able ?Dress Self Completely Able ?Feed Self Completely Able ?Walk Completely Able ?Get In / Out Bed Completely Able ?Housework Completely Able ?Prepare Meals Completely Able ?Handle Money Completely Able ?Shop for Self Completely Able ?Electronic Signature(s) ?Signed: 03/08/2022 4:11:25 PM By: Angelina Pih ?Entered By: Angelina Pih on 03/08/2022 13:05:07 ?Heidi [name], Heidi [name] Kitchen ([id number]) ?-------------------------------------------------------------------------------- ?Education Screening Details ?Patient Name: Heidi [name], Heidi [name] ?Date of Service: 03/08/2022 12:45 PM ?Medical Record Number: [id number] ?Patient Account Number: [id number] ?Date of Birth/Sex: Feb 18, 1956 (66 y.o. F) ?Treating RN: Angelina Pih ?Primary Care Hiram Mciver: Reino Kent Other Clinician: ?Referring Kanai Hilger: Willeen Niece ?Treating Florine Sprenkle/Extender: Geralyn Corwin ?Weeks in Treatment: 0 ?Learning Preferences/Education Level/Primary Language ?Learning Preference: Explanation, Demonstration, Video, Communication Board, Printed Material ?Preferred Language: English ?Cognitive Barrier ?Language Barrier: No ?Translator Needed: No ?Memory Deficit: No ?Emotional Barrier: No ?Cultural/Religious Beliefs Affecting Medical Care: No ?Physical Barrier ?Impaired Vision: No ?Impaired Hearing: No ?Decreased Hand dexterity: No ?Knowledge/Comprehension ?Knowledge Level: High ?Comprehension Level: High ?Ability to understand written instructions: High ?Ability to understand verbal instructions: High ?Motivation ?Anxiety Level: Calm ?Cooperation: Cooperative ?Education Importance: Acknowledges Need ?Interest in Health Problems: Asks Questions ?Perception: Coherent ?Willingness to Engage in Self-Management ?High ?Activities: ?Readiness to Engage in Self-Management ?High ?Activities: ?Electronic Signature(s) ?Signed: 03/08/2022 4:11:25 PM By: Angelina Pih ?Entered By: Angelina Pih on 03/08/2022 13:05:26 ?Heidi [name], Heidi [name] Kitchen ([id number]) ?-------------------------------------------------------------------------------- ?Fall Risk Assessment Details ?Patient Name: Heidi [name], Heidi [name] ?Date of Service: 03/08/2022 12:45 PM ?Medical Record Number: [id number] ?Patient Account Number: [id number] ?Date of Birth/Sex: 1956/07/10 (66 y.o. F) ?Treating RN: Angelina Pih ?Primary Care Tressie Ragin: Reino Kent Other Clinician: ?Referring Doris Gruhn: Willeen Niece ?Treating Qunicy Higinbotham/Extender: Geralyn Corwin ?Weeks in Treatment: 0 ?Fall Risk Assessment Items ?Have you had 2 or more falls in the last 12 monthso 0 No ?Have you had any fall that resulted in injury in the last 12 monthso 0 No ?FALLS RISK SCREEN ?History of falling - immediate or within 3 months 0 No ?Secondary diagnosis (Do you have 2 or more medical diagnoseso) 0 No ?Ambulatory aid ?None/bed rest/wheelchair/nurse 0 No ?Crutches/cane/walker 0 No ?Furniture 0 No ?Intravenous therapy Access/Saline/Heparin Lock 0 No ?Gait/Transferring ?Normal/ bed rest/ wheelchair 0 No ?Weak (short steps with or without shuffle, stooped but able to  lift head while walking, may ?0 No ?seek support from furniture) ?Impaired (short steps with shuffle, may have difficulty arising from chair, head down, impaired ?0 No ?balance) ?Mental Status ?Oriented to own ability 0 No ?Electronic Signature(s) ?Signed: 03/08/2022 4:11:25 PM By: Angelina Pih ?Entered By: Angelina Pih on 03/08/2022 13:05:34 ?Heidi [name], Heidi [name] Kitchen ([id number]) ?-------------------------------------------------------------------------------- ?Foot Assessment Details ?Patient Name: Heidi [name], Heidi [name] ?Date of Service: 03/08/2022 12:45 PM ?Medical Record Number: [id number] ?Patient Account Number: [id number] ?Date of Birth/Sex: January 09, 1956 (66 y.o. F) ?Treating RN: Angelina Pih ?Primary Care Heidi [name] Heidi: Reino Kent Other Clinician: ?Referring Margert Edsall: Willeen Niece ?Treating Nadea Kirkland/Extender: Geralyn Corwin ?Weeks in Treatment: 0 ?Foot Assessment Items ?Site Locations ?+ = Sensation present, - = Sensation absent, C = Callus, U = Ulcer ?R = Redness, W = Warmth, M = Maceration, PU = Pre-ulcerative lesion ?F = Fissure, S = Swelling, D = Dryness ?Assessment ?Right: Left: ?Other Deformity: No No ?Prior Foot Ulcer: No No ?Prior Amputation: No No ?Charcot Joint: No  No ?Ambulatory Status: Ambulatory Without Help ?Gait: Steady ?Electronic Signature(s) ?Signed: 03/08/2022 4:11:25 PM By: Angelina Pih ?Entered By: Angelina Pih on 03/08/2022 13:09:49 ?Heidi [name], Heidi [name] Kitchen ([id number]) ?-------------------------------------------------------------------------------- ?Nutrition Risk Screening Details ?Patient Name: ALICJA, EVERITT ?Date of Service: 03/08/2022 12:45 PM ?Medical Record Number: [id number] ?Patient Account Number: [id number] ?Date of Birth/Sex: Aug 31, 1956 (66 y.o. F) ?Treating RN: Angelina Pih ?Primary Care Analena Gama: Reino Kent Other Clinician: ?Referring Lot Medford: Willeen Niece ?Treating Caci Orren/Extender: Geralyn Corwin ?Weeks in Treatment: 0 ?Height (in): 65 ?Weight (lbs): 150 ?Body Mass Index (BMI): 25 ?Nutrition Risk Screening Items ?Score Screening ?NUTRITION RISK SCREEN: ?I have an illness or condition that made me change the kind and/or amount of food I eat 0 No ?I eat fewer than two meals per day 0 No ?I eat few fruits and vegetables, or milk products 0 No ?I have three or more drinks of beer, liquor or wine almost every day 0 No ?I have tooth or mouth problems that make it hard for me to eat 0 No ?I don'[name] always have enough money to buy the food I need 0 No ?I eat alone most of the time 0 No ?I take three or more different prescribed or over-the-counter drugs a day 0 No ?Without wanting to, I have lost or gained 10 pounds in the last six months 0 No ?I am not always physically able to shop, cook and/or feed myself 0 No ?Nutrition Protocols ?Good Risk Protocol 0 No interventions needed ?Moderate Risk Protocol ?High Risk Proctocol ?Risk Level: Good Risk ?Score: 0 ?Electronic Signature(s) ?Signed: 03/08/2022 4:11:25 PM By: Angelina Pih ?Entered By: Angelina Pih on 03/08/2022 13:05:43 ?

## 2022-03-09 NOTE — Progress Notes (Signed)
Bring, Janiyla T. ([id number]) ?Visit Report for 03/08/2022 ?Chief Complaint Document Details ?Patient Name: Heidi [name], Heidi T. ?Date of Service: 03/08/2022 12:45 PM ?Medical Record Number: [id number] ?Patient Account Number: [id number] ?Date of Birth/Sex: 05/28/1956 (65 y.o. F) ?Treating RN: Heidi [name] ?Primary Care Provider: McLaughin, [name] Other Clinician: ?Referring Provider: Stewart, [name] ?Treating Provider/Extender: Heidi [name] ?Weeks in Treatment: 0 ?Information Obtained from: Patient ?Chief Complaint ?03/08/2022; right ankle wound ?Electronic Signature(s) ?Signed: 03/08/2022 2:23:27 PM By: Hoffman, Jessica DO ?Entered By: Heidi [name] on 03/08/2022 14:02:09 ?Heidi [name], Heidi T. ([id number]) ?-------------------------------------------------------------------------------- ?Debridement Details ?Patient Name: [name], [name] ?Date of Service: 03/08/2022 12:45 PM ?Medical Record Number: [id number] ?Patient Account Number: [id number] ?Date of Birth/Sex: 03/18/1956 (65 y.o. F) ?Treating RN: Heidi [name] ?Primary Care Provider: McLaughin, [name] Other Clinician: ?Referring Provider: Stewart, [name] ?Treating Provider/Extender: Heidi [name] ?Weeks in Treatment: 0 ?Debridement Performed for ?Wound #1 Right,Lateral Ankle ?Assessment: ?Performed By: Physician Hoffman, Jessica, MD ?Debridement Type: Chemical/Enzymatic/Mechanical ?Agent Used: Santyl ?Level of Consciousness (Pre- ?Awake and Alert ?procedure): ?Pre-procedure Verification/Time Out ?Yes - 13:50 ?Taken: ?Bleeding: None ?Response to Treatment: Procedure was tolerated well ?Level of Consciousness (Post- ?Awake and Alert ?procedure): ?Post Debridement Measurements of Total Wound ?Length: (cm) 0.2 ?Width: (cm) 0.2 ?Depth: (cm) 0.2 ?Volume: (cm?) 0.006 ?Character of Wound/Ulcer Post Debridement: Stable ?Post Procedure Diagnosis ?Same as Pre-procedure ?Electronic Signature(s) ?Signed: 03/08/2022 4:08:47 PM By: Heidi [name] ?Signed: 03/09/2022 4:21:48 PM  By: Hoffman, Jessica DO ?Entered By: Heidi [name] on 03/08/2022 16:08:47 ?Heidi [name], Heidi T. ([id number]) ?-------------------------------------------------------------------------------- ?HPI Details ?Patient Name: [name], [name] ?Date of Service: 03/08/2022 12:45 PM ?Medical Record Number: [id number] ?Patient Account Number: [id number] ?Date of Birth/Sex: 06/13/1956 (65 y.o. F) ?Treating RN: Heidi [name] ?Primary Care Provider: McLaughin, [name] Other Clinician: ?Referring Provider: Stewart, [name] ?Treating Provider/Extender: Heidi [name] ?Weeks in Treatment: 0 ?History of Present Illness ?HPI Description: Admission 02/28/2022 ?Ms. Heidi [name] is a 65-year-old female with a past medical history of COPD on chronic oxygen that presents to the clinic for a 4-month ?history of nonhealing ulcer to the right lateral malleolus. She is not sure how this started. She reports first being evaluated by podiatry. She was ?then referred to dermatology who has been following this wound for the past several months. She states she has an appointment next week with ?infectious disease for this issue. She has been using mupirocin ointment and DuoDERM every other day to the wound bed. She reports chronic ?pain to the area. She currently denies systemic signs of infection. ?Electronic Signature(s) ?Signed: 03/08/2022 2:23:27 PM By: Hoffman, Jessica DO ?Entered By: Heidi [name] on 03/08/2022 14:07:11 ?Heidi [name], Heidi T. ([id number]) ?-------------------------------------------------------------------------------- ?Physical Exam Details ?Patient Name: Heidi [name], Heidi T. ?Date of Service: 03/08/2022 12:45 PM ?Medical Record Number: [id number] ?Patient Account Number: [id number] ?Date of Birth/Sex: 04/27/1956 (65 y.o. F) ?Treating RN: Heidi [name] ?Primary Care Provider: McLaughin, [name] Other Clinician: ?Referring Provider: Stewart, [name] ?Treating Provider/Extender: Heidi [name] ?Weeks in Treatment:  0 ?Constitutional ?. ?Cardiovascular ?. ?Psychiatric ?. ?Notes ?Right foot: To the lateral malleolus there is an open wound with a nonviable surface. No surrounding signs of infection. ?Electronic Signature(s) ?Signed: 03/08/2022 2:23:27 PM By: Hoffman, Jessica DO ?Entered By: Heidi [name] on 03/08/2022 14:07:54 ?Heidi [name], Heidi T. ([id number]) ?-------------------------------------------------------------------------------- ?Physician Orders Details ?Patient Name: Heidi [name], Heidi T. ?Date of Service: 03/08/2022 12:45 PM ?Medical Record Number: [id number] ?Patient Account Number: [id number] ?Date of Birth/Sex: 06/01/1956 (65 y.o. F) ?Treating RN: Heidi [name] ?Primary Care Provider: McLaughin, [name] Other Clinician: ?Referring Provider: Stewart, [name] ?Treating Provider/Extender: Heidi [name] ?Weeks in Treatment: 0 ?Verbal /   Phone Orders: No ?Diagnosis Coding ?ICD-10 Coding ?Code Description ?L97.518 Non-pressure chronic ulcer of other part of right foot with other specified severity ?J44.9 Chronic obstructive pulmonary disease, unspecified ?Follow-up Appointments ?o Return Appointment in 1 week. ?o Nurse Visit as needed ?Bathing/ Shower/ Hygiene ?o Wash wounds with antibacterial soap and water. ?o May shower; gently cleanse wound with antibacterial soap, rinse and pat dry prior to dressing wounds ?o No tub bath. ?Medications-Please add to medication list. ?o Santyl Enzymatic Ointment - please pick up from pharmacy and use as directed daily ?Wound Treatment ?Wound #1 - Ankle Wound Laterality: Right, Lateral ?Cleanser: Byram Ancillary Kit - 15 Day Supply (DME) (Generic) 1 x Per Day/30 Days ?Discharge Instructions: Use supplies as instructed; Kit contains: (15) Saline Bullets; (15) 3x3 Gauze; 15 pr Gloves ?Topical: Santyl Collagenase Ointment, 30 (gm), tube 1 x Per Day/30 Days ?Discharge Instructions: apply nickel thick to wound bed only ?Primary Dressing: (BORDER) Zetuvit Plus Silicone Border  Dressing 4x4 (in/in) (DME) (Generic) 1 x Per Day/30 Days ?Radiology ?o X-ray, ankle - right ankle ?Services and Therapies ?o Arterial Studies- Bilateral - Arterial duplex studies with segmental pressures ?Patient Medications ?Allergies: Prozac, budesonide, BuSpar, Levaquin, propranolol, Zoloft ?Notifications Medication Indication Start End ?Santyl 03/08/2022 ?DOSE 1 - topical 250 unit/gram ointment - 1 application daily ?Electronic Signature(s) ?Signed: 03/08/2022 3:16:36 PM By: Hoffman, Jessica DO ?Previous Signature: 03/08/2022 2:43:06 PM Version By: Heidi [name] ?Previous Signature: 03/08/2022 2:23:27 PM Version By: Hoffman, Jessica DO ?Previous Signature: 03/08/2022 2:13:20 PM Version By: Hoffman, Jessica DO ?Entered By: Heidi [name] on 03/08/2022 15:16:14 ?Heidi [name], Heidi T. ([id number]) ?-------------------------------------------------------------------------------- ?Problem List Details ?Patient Name: Heidi [name], Heidi T. ?Date of Service: 03/08/2022 12:45 PM ?Medical Record Number: [id number] ?Patient Account Number: [id number] ?Date of Birth/Sex: 02/27/1956 (65 y.o. F) ?Treating RN: Heidi [name] ?Primary Care Provider: McLaughin, [name] Other Clinician: ?Referring Provider: Stewart, [name] ?Treating Provider/Extender: Heidi [name] ?Weeks in Treatment: 0 ?Active Problems ?ICD-10 ?Encounter ?Code Description Active Date MDM ?Diagnosis ?L97.518 Non-pressure chronic ulcer of other part of right foot with other specified 03/08/2022 No Yes ?severity ?J44.9 Chronic obstructive pulmonary disease, unspecified 03/08/2022 No Yes ?I73.9 Peripheral vascular disease, unspecified 03/08/2022 No Yes ?Inactive Problems ?Resolved Problems ?Electronic Signature(s) ?Signed: 03/08/2022 3:16:36 PM By: Hoffman, Jessica DO ?Previous Signature: 03/08/2022 2:23:27 PM Version By: Hoffman, Jessica DO ?Entered By: Heidi [name] on 03/08/2022 15:13:09 ?Heidi [name], Heidi T.  ([id number]) ?-------------------------------------------------------------------------------- ?Progress Note Details ?Patient Name: Heidi [name], Heidi T. ?Date of Service: 03/08/2022 12:45 PM ?Medical Record Number: [id number] ?Patient Account Number: [id number] ?Date of Birth/Sex: 05/03/1956 (65 y.

## 2022-03-14 ENCOUNTER — Ambulatory Visit
Admission: RE | Admit: 2022-03-14 | Discharge: 2022-03-14 | Disposition: A | Discharge status: Home / Self Care | Payer: Medicare Other | Source: Ambulatory Visit | Attending: Physician Assistant | Admitting: Physician Assistant

## 2022-03-14 DIAGNOSIS — Z1231 Encounter for screening mammogram for malignant neoplasm of breast: Secondary | ICD-10-CM | POA: Insufficient documentation

## 2022-03-15 ENCOUNTER — Encounter (HOSPITAL_BASED_OUTPATIENT_CLINIC_OR_DEPARTMENT_OTHER): Payer: Medicare Other | Admitting: Internal Medicine

## 2022-03-15 DIAGNOSIS — J449 Chronic obstructive pulmonary disease, unspecified: Secondary | ICD-10-CM

## 2022-03-15 DIAGNOSIS — L97518 Non-pressure chronic ulcer of other part of right foot with other specified severity: Secondary | ICD-10-CM

## 2022-03-15 DIAGNOSIS — I739 Peripheral vascular disease, unspecified: Secondary | ICD-10-CM

## 2022-04-05 ENCOUNTER — Other Ambulatory Visit (INDEPENDENT_AMBULATORY_CARE_PROVIDER_SITE_OTHER): Payer: Self-pay | Admitting: Internal Medicine

## 2022-04-05 DIAGNOSIS — I739 Peripheral vascular disease, unspecified: Secondary | ICD-10-CM

## 2022-04-07 ENCOUNTER — Ambulatory Visit (INDEPENDENT_AMBULATORY_CARE_PROVIDER_SITE_OTHER): Payer: Medicare Other

## 2022-04-07 ENCOUNTER — Encounter (INDEPENDENT_AMBULATORY_CARE_PROVIDER_SITE_OTHER): Payer: Medicare Other | Admitting: Nurse Practitioner

## 2022-04-07 DIAGNOSIS — I739 Peripheral vascular disease, unspecified: Secondary | ICD-10-CM

## 2022-04-12 ENCOUNTER — Encounter: Payer: Medicare Other | Attending: Internal Medicine | Admitting: Internal Medicine

## 2022-04-12 DIAGNOSIS — L97518 Non-pressure chronic ulcer of other part of right foot with other specified severity: Secondary | ICD-10-CM | POA: Diagnosis present

## 2022-04-12 DIAGNOSIS — Z9981 Dependence on supplemental oxygen: Secondary | ICD-10-CM | POA: Diagnosis not present

## 2022-04-12 DIAGNOSIS — I739 Peripheral vascular disease, unspecified: Secondary | ICD-10-CM | POA: Insufficient documentation

## 2022-04-12 DIAGNOSIS — J449 Chronic obstructive pulmonary disease, unspecified: Secondary | ICD-10-CM | POA: Insufficient documentation

## 2022-04-12 NOTE — Progress Notes (Signed)
Heidi [name] ([id number]) Visit Report for 04/12/2022 Chief Complaint Document Details Patient Name: Heidi [name], Heidi T. Date of Service: 04/12/2022 2:00 PM Medical Record Number: [id number] Patient Account Number: [id number] Date of Birth/Sex: 1956/04/06 (66 y.o. F) Treating RN: Carlene Coria Primary Care Provider: Harold Barban Other Clinician: Referring Provider: Harold Barban Treating Provider/Extender: Yaakov Guthrie in Treatment: 5 Information Obtained from: Patient Chief Complaint 03/08/2022; right ankle wound Electronic Signature(s) Signed: 04/12/2022 3:00:37 PM By: Kalman Shan DO Entered By: Kalman Shan on 04/12/2022 14:55:02 Heidi [name], Heidi [name] ([id number]) -------------------------------------------------------------------------------- HPI Details Patient Name: Heidi Papa T. Date of Service: 04/12/2022 2:00 PM Medical Record Number: [id number] Patient Account Number: [id number] Date of Birth/Sex: [birth year] (66 y.o. F) Treating RN: Carlene Coria Primary Care Provider: Harold Barban Other Clinician: Referring Provider: Harold Barban Treating Provider/Extender: Yaakov Guthrie in Treatment: 5 History of Present Illness HPI Description: Admission 02/28/2022 Heidi [name] is a 66 year old female with a past medical history of COPD on chronic oxygen that presents to the clinic for a 91-monthhistory of nonhealing ulcer to the right lateral malleolus. She is not sure how this started. She reports first being evaluated by podiatry. She was then referred to dermatology who has been following this wound for the past several months. She states she has an appointment next week with infectious disease for this issue. She has been using mupirocin ointment and DuoDERM every other day to the wound bed. She reports chronic pain to the area. She currently denies systemic signs of infection. 5/17; patient presents for follow-up. She is scheduled to  have arterial studies on 6/9. She saw infectious disease today who obtained a wound culture however recommended only following up as needed. They will decide based on the culture results if she needs antibiotics. She has been using Santyl daily to the wound bed. She currently denies systemic signs of infection. She had an x-ray done at last clinic visit that showed no obvious signs of acute osteomyelitis. 6/14; patient presents for follow-up. She had her ABI studies done that showed an ABI of 1.09 on the right with a TBI of 0.77. She had arterial studies that showed triphasic waveforms except for at the peroneal distal artery and this was monophasic. She has been using Santyl to the wound bed. She reports there is no growth to the wound culture done by infectious disease. She has no issues or complaints today. She denies signs of infection. Electronic Signature(s) Signed: 04/12/2022 3:00:37 PM By: HKalman ShanDO Entered By: HKalman Shanon 04/12/2022 14:56:24 Heidi [name]([id number] -------------------------------------------------------------------------------- Physical Exam Details Patient Name: DWendall PapaT. Date of Service: 04/12/2022 2:00 PM Medical Record Number: [id number] Account Number: [id number] of Birth/Sex: [birth year](66y.o. F) Treating RN: ECarlene CoriaPrimary Care Provider: MHarold BarbanOther Clinician: Referring Provider: MHarold BarbanTreating Provider/Extender: HYaakov Guthriein Treatment: 5 Constitutional . Psychiatric . Notes Right foot: To the lateral malleolus there is an open wound with a nonviable surface. No surrounding signs of infection. Difficult to palpate pedal pulses. Electronic Signature(s) Signed: 04/12/2022 3:00:37 PM By: HKalman ShanDO Entered By: HKalman Shanon 04/12/2022 14:57:26 Hascall, Heidi [name] ([id number] -------------------------------------------------------------------------------- Physician Orders Details Patient Name: DWendall PapaT. Date of Service: 04/12/2022 2:00 PM Medical Record Number: [id number] Account Number: [id number] of Birth/Sex: 602/21/1957(66y.o. F) Treating RN: ECarlene CoriaPrimary Care Provider: MHarold BarbanOther Clinician: Referring Provider: MHarold BarbanTreating Provider/Extender: HYaakov Guthriein Treatment: 5 Verbal / Phone Orders:  No Diagnosis Coding Follow-up Appointments o Return Appointment in 1 month o Nurse Visit as needed Bathing/ Shower/ Hygiene o Wash wounds with antibacterial soap and water. o May shower; gently cleanse wound with antibacterial soap, rinse and pat dry prior to dressing wounds o No tub bath. Medications-Please add to medication list. o Santyl Enzymatic Ointment - please pick up from pharmacy and use as directed daily Wound Treatment Wound #1 - Ankle Wound Laterality: Right, Lateral Cleanser: Byram Ancillary Kit - 15 Day Supply (Generic) 1 x Per Day/30 Days Discharge Instructions: Use supplies as instructed; Kit contains: (15) Saline Bullets; (15) 3x3 Gauze; 15 pr Gloves Topical: Santyl Collagenase Ointment, 30 (gm), tube 1 x Per Day/30 Days Discharge Instructions: apply nickel thick to wound bed only Primary Dressing: (BORDER) Zetuvit Plus Silicone Border Dressing 4x4 (in/in) (Generic) 1 x Per Day/30 Days Electronic Signature(s) Signed: 04/12/2022 3:00:37 PM By: Kalman Shan DO Entered By: Kalman Shan on 04/12/2022 15:00:09 Heidi [name], Heidi [name] Kitchen ([id number]) -------------------------------------------------------------------------------- Problem List Details Patient Name: Heidi Papa T. Date of Service: 04/12/2022 2:00 PM Medical Record Number: [id number] Patient Account Number: [id number] Date of Birth/Sex: 1956-07-19 (66 y.o. 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 ([id number]) -------------------------------------------------------------------------------- Progress Note Details Patient Name: Heidi Papa T. Date of Service: 04/12/2022 2:00 PM Medical Record Number: [id number] Patient Account Number: [id number] Date of Birth/Sex: September 25, 1956 (66 y.o. F) Treating RN: Carlene Coria Primary Care Provider: Harold Barban Other Clinician: Referring Provider: Harold Barban Treating Provider/Extender: Yaakov Guthrie in Treatment: 5 Subjective Chief Complaint Information obtained from Patient 03/08/2022; right ankle wound History of Present Illness (HPI) Admission 02/28/2022 Ms. Franceen Erisman is a 66 year old female with a past medical history of COPD on chronic oxygen that presents to the clinic for a 71-monthhistory of nonhealing ulcer to the right lateral malleolus. She is not sure how this started. She reports first being evaluated by podiatry. She was then referred to dermatology who has been following this wound for the past several months. She states she has an appointment next week with infectious disease for this issue. She has been using mupirocin ointment and DuoDERM every other day to the wound bed. She reports chronic pain to the area. She currently denies systemic signs  of infection. 5/17; patient presents for follow-up. She is scheduled to have arterial studies on 6/9. She saw infectious disease today who obtained a wound culture however recommended only following up as needed. They will decide based on the culture results if she needs antibiotics. She has been using Santyl daily to the wound bed. She currently denies systemic signs of infection. She had an x-ray done at last clinic visit that showed no obvious signs of acute osteomyelitis. 6/14; patient presents for follow-up. She had her ABI studies done that showed an ABI of 1.09 on the right with a TBI of 0.77. She had arterial studies that showed triphasic waveforms except for at the peroneal distal artery and this was monophasic. She has been using Santyl to the wound bed.  She reports there is no growth to the wound culture done by infectious disease. She has no issues or complaints today. She denies signs of infection. Objective Constitutional Vitals Time Taken: 2:25 PM, Weight: 150 lbs, Temperature: 98.1 F, Pulse: 103 bpm, Respiratory Rate: 18 breaths/min, Blood Pressure: 174/83 mmHg. General Notes: Right foot: To the lateral malleolus there is an open wound with a nonviable surface. No surrounding signs of infection. Difficult to palpate pedal pulses. Integumentary (Hair, Skin) Wound #1 status is Open. Original cause of wound was Gradually Appeared. The date acquired was: 11/30/2021. The wound has been in treatment 5 weeks. The wound is located on the Right,Lateral Ankle. The wound measures 0.2cm length x 0.2cm width x 0.2cm depth; 0.031cm^2 area and 0.006cm^3 volume. There is no tunneling or undermining noted. There is a medium amount of serosanguineous drainage noted. There is no granulation within the wound bed. There is a large (67-100%) amount of necrotic tissue within the wound bed including Adherent Slough. Assessment Active Problems ICD-10 Non-pressure chronic ulcer of other part of right  foot with other specified severity Chronic obstructive pulmonary disease, unspecified Peripheral vascular disease, unspecified Heidi [name], Heidi T. ([id number]) Patient's wound is slightly smaller. No signs of surrounding infection. I recommended continuing Santyl daily. Her ABI and TBI were normal. She had triphasic waveforms throughout the large vessels On the right however she did have monophasic waveforms at the peroneal distal artery. Because of this I would like for vein and vascular to evaluate to see if there is any further intervention that is needed To help with wound healing. Plan Follow-up Appointments: Return Appointment in 1 month Nurse Visit as needed Bathing/ Shower/ Hygiene: Wash wounds with antibacterial soap and water. May shower; gently cleanse wound with antibacterial soap, rinse and pat dry prior to dressing wounds No tub bath. Medications-Please add to medication list.: Santyl Enzymatic Ointment - please pick up from pharmacy and use as directed daily WOUND #1: - Ankle Wound Laterality: Right, Lateral Cleanser: Byram Ancillary Kit - 15 Day Supply (Generic) 1 x Per Day/30 Days Discharge Instructions: Use supplies as instructed; Kit contains: (15) Saline Bullets; (15) 3x3 Gauze; 15 pr Gloves Topical: Santyl Collagenase Ointment, 30 (gm), tube 1 x Per Day/30 Days Discharge Instructions: apply nickel thick to wound bed only Primary Dressing: (BORDER) Zetuvit Plus Silicone Border Dressing 4x4 (in/in) (Generic) 1 x Per Day/30 Days 1. Santyl daily 2. Vein and vascular referral 3. Follow-up in 2 weeks Electronic Signature(s) Signed: 04/12/2022 3:00:37 PM By: Kalman Shan DO Entered By: Kalman Shan on 04/12/2022 14:59:42 Heidi [name], Heidi T. ([id number]) -------------------------------------------------------------------------------- SuperBill Details Patient Name: Heidi Papa T. Date of Service: 04/12/2022 Medical Record Number: [id number] Patient Account  Number: [id number] Date of Birth/Sex: 08-Sep-1956 (66 y.o. F) Treating RN: Carlene Coria Primary Care Provider: Harold Barban Other Clinician: Referring Provider: Harold Barban Treating Provider/Extender: Yaakov Guthrie in Treatment: 5 Diagnosis Coding ICD-10 Codes Code Description [phone] Non-pressure chronic ulcer of other part of right foot with other specified severity J44.9 Chronic obstructive pulmonary disease, unspecified I73.9 Peripheral vascular disease, unspecified Facility Procedures CPT4 Code: [id number] Description: 99213 - WOUND CARE VISIT-LEV 3 EST PT Modifier: Quantity: 1 Physician Procedures CPT4 Code: [id number] Description: 46659 - WC PHYS LEVEL 3 - EST PT Modifier: Quantity: 1 CPT4 Code: Description: ICD-10 Diagnosis Description L97.518 Non-pressure chronic ulcer of other part of right foot with other specif J44.9 Chronic obstructive pulmonary disease, unspecified I73.9 Peripheral vascular disease, unspecified Modifier: ied severity Quantity: Electronic Signature(s) Signed: 04/12/2022 3:00:37 PM By: Heber Magness,  Janett Billow DO Entered By: Kalman Shan on 04/12/2022 14:59:59

## 2022-04-12 NOTE — Progress Notes (Signed)
TANDY, GRAWE ([id number]) Visit Report for 03/15/2022 Arrival Information Details Patient Name: LAURENA, VALKO. Date of Service: 03/15/2022 2:00 PM Medical Record Number: [id number] Patient Account Number: [id number] Date of Birth/Sex: 1956/09/11 (66 y.o. F) Treating RN: Yevonne Pax Primary Care Breeann Reposa: Reino Kent Other Clinician: Referring Brytney Somes: Reino Kent Treating Saachi Zale/Extender: Tilda Franco in Treatment: 1 Visit Information History Since Last Visit All ordered tests and consults were completed: No Patient Arrived: Ambulatory Added or deleted any medications: No Arrival Time: 14:01 Any new allergies or adverse reactions: No Accompanied By: self Had a fall or experienced change in No Transfer Assistance: None activities of daily living that may affect Patient Identification Verified: Yes risk of falls: Secondary Verification Process Completed: Yes Signs or symptoms of abuse/neglect since last visito No Patient Requires Transmission-Based No Hospitalized since last visit: No Precautions: Implantable device outside of the clinic excluding No Patient Has Alerts: Yes cellular tissue based products placed in the center Patient Alerts: ABI 04/07/22 L 1.14 R since last visit: 1.09 Has Dressing in Place as Prescribed: Yes TBI 04/07/22 L 0.81 R Pain Present Now: No 0.77 Electronic Signature(s) Signed: 04/12/2022 2:14:59 PM By: Angelina Pih Previous Signature: 03/16/2022 4:24:09 PM Version By: Yevonne Pax RN Entered By: Angelina Pih on 04/12/2022 14:14:59 Ibach, Ellwood Dense ([id number]) -------------------------------------------------------------------------------- Clinic Level of Care Assessment Details Patient Name: [name] Date of Service: 03/15/2022 2:00 PM Medical Record Number: [id number] Patient Account Number: [id number] Date of Birth/Sex: 01-May-1956 (66 y.o. F) Treating RN: Yevonne Pax Primary Care Palma Buster:  Reino Kent Other Clinician: Referring Sandy Blouch: Reino Kent Treating Magdalynn Davilla/Extender: Tilda Franco in Treatment: 1 Clinic Level of Care Assessment Items TOOL 4 Quantity Score X - Use when only an EandM is performed on FOLLOW-UP visit 1 0 ASSESSMENTS - Nursing Assessment / Reassessment X - Reassessment of Co-morbidities (includes updates in patient status) 1 10 X- 1 5 Reassessment of Adherence to Treatment Plan ASSESSMENTS - Wound and Skin Assessment / Reassessment X - Simple Wound Assessment / Reassessment - one wound 1 5  - 0 Complex Wound Assessment / Reassessment - multiple wounds  - 0 Dermatologic / Skin Assessment (not related to wound area) ASSESSMENTS - Focused Assessment  - Circumferential Edema Measurements - multi extremities 0  - 0 Nutritional Assessment / Counseling / Intervention  - 0 Lower Extremity Assessment (monofilament, tuning fork, pulses)  - 0 Peripheral Arterial Disease Assessment (using hand held doppler) ASSESSMENTS - Ostomy and/or Continence Assessment and Care  - Incontinence Assessment and Management 0  - 0 Ostomy Care Assessment and Management (repouching, etc.) PROCESS - Coordination of Care X - Simple Patient / Family Education for ongoing care 1 15  - 0 Complex (extensive) Patient / Family Education for ongoing care  - 0 Staff obtains Chiropractor, Records, Test Results / Process Orders  - 0 Staff telephones HHA, Nursing Homes / Clarify orders / etc  - 0 Routine Transfer to another Facility (non-emergent condition)  - 0 Routine Hospital Admission (non-emergent condition)  - 0 New Admissions / Manufacturing engineer / Ordering NPWT, Apligraf, etc.  - 0 Emergency Hospital Admission (emergent condition) X- 1 10 Simple Discharge Coordination  - 0 Complex (extensive) Discharge Coordination PROCESS - Special Needs  - Pediatric / Minor Patient Management 0  - 0 Isolation Patient  Management  - 0 Hearing / Language / Visual special needs  - 0 Assessment of Community assistance (transportation, D/C planning, etc.)  - 0 Additional assistance / Altered mentation  - 0 Support  Surface(s) Assessment (bed, cushion, seat, etc.) INTERVENTIONS - Wound Cleansing / Measurement Deen, Makenize T. ([id number]) X- 1 5 Simple Wound Cleansing - one wound []  - 0 Complex Wound Cleansing - multiple wounds X- 1 5 Wound Imaging (photographs - any number of wounds) []  - 0 Wound Tracing (instead of photographs) X- 1 5 Simple Wound Measurement - one wound []  - 0 Complex Wound Measurement - multiple wounds INTERVENTIONS - Wound Dressings X - Small Wound Dressing one or multiple wounds 1 10 []  - 0 Medium Wound Dressing one or multiple wounds []  - 0 Large Wound Dressing one or multiple wounds []  - 0 Application of Medications - topical []  - 0 Application of Medications - injection INTERVENTIONS - Miscellaneous []  - External ear exam 0 []  - 0 Specimen Collection (cultures, biopsies, blood, body fluids, etc.) []  - 0 Specimen(s) / Culture(s) sent or taken to Lab for analysis []  - 0 Patient Transfer (multiple staff / / Similar devices) []  - 0 Simple Staple / Suture removal (25 or less) []  - 0 Complex Staple / Suture removal (26 or more) []  - 0 Hypo / Hyperglycemic Management (close monitor of Blood Glucose) []  - 0 Ankle / Brachial Index (ABI) - do not check if billed separately X- 1 5 Vital Signs Has the patient been seen at the hospital within the last three years: Yes Total Score: 75 Level Of Care: New/Established - Level 2 Electronic Signature(s) Signed: 03/16/2022 4:24:09 PM By: RN Entered By: on 03/15/2022 14:33:41 Proch, ( ) -------------------------------------------------------------------------------- Encounter Discharge Information Details Patient Name: T. Date of Service:  03/15/2022 2:00 PM Medical Record Number: Patient Account Number: Date of Birth/Sex: March 30, 1956 (66 y.o. F) Treating RN: Primary Care Jayliana Valencia: Other Clinician: Referring Tyneka Scafidi: Treating Naama Sappington/Extender: 03/18/2022 in Treatment: 1 Encounter Discharge Information Items Post Procedure Vitals Discharge Condition: Stable Temperature (F): 97.7 Ambulatory Status: Ambulatory Pulse (bpm): 96 Discharge Destination: Home Respiratory Rate (breaths/min): 18 Transportation: Private Auto Blood Pressure (mmHg): 141/74 Accompanied By: self Schedule Follow-up Appointment: Yes Clinical Summary of Care: Patient Declined Electronic Signature(s) Signed: 03/16/2022 4:24:09 PM By: Yevonne Pax RN Entered By: 03/17/2022 on 03/15/2022 14:35:11 [place], [id number] (Esmeralda Links) -------------------------------------------------------------------------------- Lower Extremity Assessment Details Patient Name: 03/17/2022 T. Date of Service: 03/15/2022 2:00 PM Medical Record Number: [id number] Patient Account Number: 04/26/1956 Date of Birth/Sex: 08-26-1956 (66 y.o. F) Treating RN: Reino Kent Primary Care Kaedence Connelly: Reino Kent Other Clinician: Referring Jeannia Tatro: Tilda Franco Treating Daphney Hopke/Extender: 03/18/2022 in Treatment: 1 Edema Assessment Assessed: [Left: No] [Right: No] [Left: Edema] [Right: :] Calf Left: Right: Point of Measurement: 35 cm From Medial Instep 30 cm Ankle Left: Right: Point of Measurement: 11 cm From Medial Instep 18 cm Knee To Floor Left: Right: From Medial Instep 46 cm Vascular Assessment Pulses: Dorsalis Pedis Palpable: [Right:Yes] Electronic Signature(s) Signed: 03/16/2022 4:24:09 PM By: Yevonne Pax RN Entered By: 03/17/2022 on 03/15/2022 14:10:20 Gries, [id number]  (Esmeralda Links) -------------------------------------------------------------------------------- Multi Wound Chart Details Patient Name: 03/17/2022 T. Date of Service: 03/15/2022 2:00 PM Medical Record Number: [id number] Patient Account Number: 04/26/1956 Date of Birth/Sex: 31-Jul-1956 (66 y.o. F) Treating RN: Reino Kent Primary Care Georgiana Spillane: Reino Kent Other Clinician: Referring Laraine Samet: Tilda Franco Treating Keoki Mchargue/Extender: 03/18/2022 in Treatment: 1 Vital Signs Height(in): 65 Pulse(bpm): 96 Weight(lbs): 150 Blood Pressure(mmHg): 141/74 Body Mass Index(BMI): 25 Temperature(F): 97.7 Respiratory Rate(breaths/min): 18 Photos: [N/A:N/A] Wound Location: Right, Lateral Ankle  N/A N/A Wounding Event: Gradually Appeared N/A N/A Primary Etiology: To be determined N/A N/A Comorbid History: Chronic Obstructive Pulmonary N/A N/A Disease (COPD), Hypotension Date Acquired: 11/30/2021 N/A N/A Weeks of Treatment: 1 N/A N/A Wound Status: Open N/A N/A Wound Recurrence: No N/A N/A Measurements L x W x D (cm) 0.3x0.3x0.2 N/A N/A Area (cm) : 0.071 N/A N/A Volume (cm) : 0.014 N/A N/A % Reduction in Area: -129.00% N/A N/A % Reduction in Volume: -133.30% N/A N/A Classification: Full Thickness Without Exposed N/A N/A Support Structures Exudate Amount: Medium N/A N/A Exudate Type: Serosanguineous N/A N/A Exudate Color: red, brown N/A N/A Granulation Amount: None Present (0%) N/A N/A Necrotic Amount: Large (67-100%) N/A N/A Epithelialization: None N/A N/A Treatment Notes Electronic Signature(s) Signed: 03/16/2022 4:24:09 PM By: Yevonne Pax RN Entered By: Yevonne Pax on 03/15/2022 14:10:43 Casares, Ellwood Dense ([id number]) -------------------------------------------------------------------------------- Multi-Disciplinary Care Plan Details Patient Name: [name] Date of Service: 03/15/2022 2:00 PM Medical Record Number: [id number] Patient Account Number:  [id number] Date of Birth/Sex: 06/14/1956 (66 y.o. F) Treating RN: Yevonne Pax Primary Care Laurelin Elson: Reino Kent Other Clinician: Referring Airlie Blumenberg: Reino Kent Treating Cecily Lawhorne/Extender: Tilda Franco in Treatment: 1 Active Inactive Orientation to the Wound Care Program Nursing Diagnoses: Knowledge deficit related to the wound healing center program Goals: Patient/caregiver will verbalize understanding of the Wound Healing Center Program Date Initiated: 03/08/2022 Target Resolution Date: 03/15/2022 Goal Status: Active Interventions: Provide education on orientation to the wound center Notes: Wound/Skin Impairment Nursing Diagnoses: Impaired tissue integrity Knowledge deficit related to ulceration/compromised skin integrity Goals: Ulcer/skin breakdown will have a volume reduction of 30% by week 4 Date Initiated: 03/08/2022 Target Resolution Date: 04/05/2022 Goal Status: Active Ulcer/skin breakdown will have a volume reduction of 50% by week 8 Date Initiated: 03/08/2022 Target Resolution Date: 05/03/2022 Goal Status: Active Ulcer/skin breakdown will have a volume reduction of 80% by week 12 Date Initiated: 03/08/2022 Target Resolution Date: 05/31/2022 Goal Status: Active Ulcer/skin breakdown will heal within 14 weeks Date Initiated: 03/08/2022 Target Resolution Date: 06/14/2022 Goal Status: Active Interventions: Assess patient/caregiver ability to obtain necessary supplies Assess patient/caregiver ability to perform ulcer/skin care regimen upon admission and as needed Assess ulceration(s) every visit Provide education on ulcer and skin care Notes: Electronic Signature(s) Signed: 03/16/2022 4:24:09 PM By: Yevonne Pax RN Entered By: Yevonne Pax on 03/15/2022 14:10:24 Belles, Halen TMarland Kitchen ([id number]) -------------------------------------------------------------------------------- Pain Assessment Details Patient Name: [name] Date of Service:  03/15/2022 2:00 PM Medical Record Number: [id number] Patient Account Number: [id number] Date of Birth/Sex: Aug 29, 1956 (66 y.o. F) Treating RN: Yevonne Pax Primary Care Solange Emry: Reino Kent Other Clinician: Referring Arlissa Monteverde: Reino Kent Treating Miller Edgington/Extender: Tilda Franco in Treatment: 1 Active Problems Location of Pain Severity and Description of Pain Patient Has Paino No Site Locations Pain Management and Medication Current Pain Management: Electronic Signature(s) Signed: 03/16/2022 4:24:09 PM By: Yevonne Pax RN Entered By: Yevonne Pax on 03/15/2022 14:05:31 Minnich, Ellwood Dense ([id number]) -------------------------------------------------------------------------------- Patient/Caregiver Education Details Patient Name: [name] Date of Service: 03/15/2022 2:00 PM Medical Record Number: [id number] Patient Account Number: [id number] Date of Birth/Gender: January 14, 1956 (66 y.o. F) Treating RN: Yevonne Pax Primary Care Physician: Reino Kent Other Clinician: Referring Physician: Reino Kent Treating Physician/Extender: Tilda Franco in Treatment: 1 Education Assessment Education Provided To: Patient Education Topics Provided Wound/Skin Impairment: Methods: Explain/Verbal Responses: State content correctly Electronic Signature(s) Signed: 03/16/2022 4:24:09 PM By: Yevonne Pax RN Entered By: Yevonne Pax on 03/15/2022 14:34:06 Russon, Ellwood Dense ([id number]) -------------------------------------------------------------------------------- Wound Assessment Details Patient Name: Darcella Gasman,  Lajean T. Date of Service: 03/15/2022 2:00 PM Medical Record Number: [id number] Patient Account Number: [id number] Date of Birth/Sex: August 18, 1956 (66 y.o. F) Treating RN: Yevonne PaxEpps, Carrie Primary Care Jovonni Borquez: Reino KentMcLaughin, Miriam Other Clinician: Referring Roth Ress: Reino KentMcLaughin, Miriam Treating Mickle Campton/Extender: Tilda FrancoHoffman, Jessica Weeks in Treatment:  1 Wound Status Wound Number: 1 Primary To be determined Etiology: Wound Location: Right, Lateral Ankle Wound Status: Open Wounding Event: Gradually Appeared Comorbid Chronic Obstructive Pulmonary Disease (COPD), Date Acquired: 11/30/2021 History: Hypotension Weeks Of Treatment: 1 Clustered Wound: No Photos Wound Measurements Length: (cm) 0.3 Width: (cm) 0.3 Depth: (cm) 0.2 Area: (cm) 0.071 Volume: (cm) 0.014 % Reduction in Area: -129% % Reduction in Volume: -133.3% Epithelialization: None Tunneling: No Undermining: No Wound Description Classification: Full Thickness Without Exposed Support Structu Exudate Amount: Medium Exudate Type: Serosanguineous Exudate Color: red, brown res Foul Odor After Cleansing: No Slough/Fibrino Yes Wound Bed Granulation Amount: None Present (0%) Necrotic Amount: Large (67-100%) Necrotic Quality: Adherent Scientist, physiologicallough Electronic Signature(s) Signed: 03/16/2022 4:24:09 PM By: Yevonne PaxEpps, Carrie RN Entered By: Yevonne PaxEpps, Carrie on 03/15/2022 14:08:50 Purtle, Ellwood DenseYNTHIA T. ([id number]) -------------------------------------------------------------------------------- Vitals Details Patient Name: [name], Elly T. Date of Service: 03/15/2022 2:00 PM Medical Record Number: [id number] Patient Account Number: [id number] Date of Birth/Sex: August 18, 1956 (66 y.o. F) Treating RN: Yevonne PaxEpps, Carrie Primary Care Amairany Schumpert: Reino KentMcLaughin, Miriam Other Clinician: Referring Yevette Knust: Reino KentMcLaughin, Miriam Treating Goldy Calandra/Extender: Tilda FrancoHoffman, Jessica Weeks in Treatment: 1 Vital Signs Time Taken: 14:04 Temperature (F): 97.7 Height (in): 65 Pulse (bpm): 96 Weight (lbs): 150 Respiratory Rate (breaths/min): 18 Body Mass Index (BMI): 25 Blood Pressure (mmHg): 141/74 Reference Range: 80 - 120 mg / dl Electronic Signature(s) Signed: 03/16/2022 4:24:09 PM By: Yevonne PaxEpps, Carrie RN Entered By: Yevonne PaxEpps, Carrie on 03/15/2022 14:05:20

## 2022-04-26 ENCOUNTER — Encounter: Payer: Medicare Other | Admitting: Internal Medicine

## 2022-04-26 DIAGNOSIS — L97518 Non-pressure chronic ulcer of other part of right foot with other specified severity: Secondary | ICD-10-CM | POA: Diagnosis not present

## 2022-04-28 NOTE — Progress Notes (Signed)
Heidi [name], Heidi [name] ([id number]) Visit Report for 04/26/2022 Arrival Information Details Patient Name: Heidi [name], Heidi [name]. Date of Service: 04/26/2022 1:15 PM Medical Record Number: [id number] Patient Account Number: [id number] Date of Birth/Sex: February 03, 1956 (66 y.o. F) Treating RN: Heidi [name] Primary Care Heidi [name]: Heidi [name] Other Clinician: Referring Heidi [name]: Heidi [name] Treating Heidi [name]/Extender: Heidi [name] in Treatment: 7 Visit Information History Since Last Visit All ordered tests and consults were completed: No Patient Arrived: Ambulatory Added or deleted any medications: No Arrival Time: 13:19 Any new allergies or adverse reactions: No Accompanied By: self Had a fall or experienced change in No Transfer Assistance: None activities of daily living that may affect Patient Identification Verified: Yes risk of falls: Secondary Verification Process Completed: Yes Signs or symptoms of abuse/neglect since last visito No Patient Requires Transmission-Based No Hospitalized since last visit: No Precautions: Implantable device outside of the clinic excluding No Patient Has Alerts: Yes cellular tissue based products placed in the center Patient Alerts: ABI 04/07/22 L 1.14 R since last visit: 1.09 Has Dressing in Place as Prescribed: Yes TBI 04/07/22 L 0.81 R Pain Present Now: No 0.77 Electronic Signature(s) Signed: 04/28/2022 10:10:35 AM By: Heidi Coria RN Entered By: Heidi [name] on 04/26/2022 13:22:57 [name], Heidi Pink ([id number]) -------------------------------------------------------------------------------- Clinic Level of Care Assessment Details Patient Name: Heidi Papa T. Date of Service: 04/26/2022 1:15 PM Medical Record Number: [id number] Patient Account Number: [id number] Date of Birth/Sex: 21-Apr-1956 (66 y.o. F) Treating RN: Heidi [name] Primary Care Heidi [name]: Heidi [name] Other Clinician: Referring Heidi [name]: Heidi [name] Treating Heidi [name]/Extender: Heidi [name] in Treatment: 7 Clinic Level of Care Assessment Items TOOL 4 Quantity Score X - Use when only an EandM is performed on FOLLOW-UP visit 1 0 ASSESSMENTS - Nursing Assessment / Reassessment X - Reassessment of Co-morbidities (includes updates in patient status) 1 10 X- 1 5 Reassessment of Adherence to Treatment Plan ASSESSMENTS - Wound and Skin Assessment / Reassessment X - Simple Wound Assessment / Reassessment - one wound 1 5 _0  - 0 Complex Wound Assessment / Reassessment - multiple wounds _1  - 0 Dermatologic / Skin Assessment (not related to wound area) ASSESSMENTS - Focused Assessment _2  - Circumferential Edema Measurements - multi extremities 0 _3  - 0 Nutritional Assessment / Counseling / Intervention _4  - 0 Lower Extremity Assessment (monofilament, tuning fork, pulses) _5  - 0 Peripheral Arterial Disease Assessment (using hand held doppler) ASSESSMENTS - Ostomy and/or Continence Assessment and Care _6  - Incontinence Assessment and Management 0 _7  - 0 Ostomy Care Assessment and Management (repouching, etc.) PROCESS - Coordination of Care X - Simple Patient / Family Education for ongoing care 1 15 _8  - 0 Complex (extensive) Patient / Family Education for ongoing care X- 1 10 Staff obtains Programmer, systems, Records, Test Results / Process Orders _9  - 0 Staff telephones HHA, Nursing Homes / Clarify orders / etc _10  - 0 Routine Transfer to another Facility (non-emergent condition) _11  - 0 Routine Hospital Admission (non-emergent condition) _12  - 0 New Admissions / Biomedical engineer / Ordering NPWT, Apligraf, etc. _13  - 0 Emergency Hospital Admission (emergent condition) X- 1 10 Simple Discharge Coordination _14  - 0 Complex (extensive) Discharge Coordination PROCESS - Special Needs _15  - Pediatric / Minor Patient Management 0 _16  - 0 Isolation Patient Management _17  - 0 Hearing / Language / Visual special needs _18  -  0 Assessment of Community assistance (transportation, D/C planning, etc.) _19  - 0 Additional assistance / Altered mentation _20  - 0 Support Surface(s) Assessment (bed, cushion, seat, etc.) INTERVENTIONS - Wound  Cleansing / Measurement [name], Heidi T. ([id number]) X- 1 5 Simple Wound Cleansing - one wound _0  - 0 Complex Wound Cleansing - multiple wounds X- 1 5 Wound Imaging (photographs - any number of wounds) _1  - 0 Wound Tracing (instead of photographs) X- 1 5 Simple Wound Measurement - one wound _2  - 0 Complex Wound Measurement - multiple wounds INTERVENTIONS - Wound Dressings X - Small Wound Dressing one or multiple wounds 1 10 _3  - 0 Medium Wound Dressing one or multiple wounds _4  - 0 Large Wound Dressing one or multiple wounds <[id number]>_2<\/[id number]>_2  - 0 Application of Medications - topical <BWLSLHTDSKAJGOTL>_5<\/BWIOMBTDHRCBULAG>_5  - 0 Application of Medications - injection INTERVENTIONS - Miscellaneous _7  - External ear exam 0 _8  - 0 Specimen Collection (cultures, biopsies, blood, body fluids, etc.) _9  - 0 Specimen(s) / Culture(s) sent or taken to Lab for analysis _10  - 0 Patient Transfer (multiple staff / Civil Service fast streamer / Similar devices) _11  - 0 Simple Staple / Suture removal (25 or less) _12  - 0 Complex Staple / Suture removal (26 or more) _13  - 0 Hypo / Hyperglycemic Management (close monitor of Blood Glucose) _14  - 0 Ankle / Brachial Index (ABI) - do not check if billed separately X- 1 5 Vital Signs Has the patient been seen at the hospital within the last three years: Yes Total Score: 85 Level Of Care: New/Established - Level 3 Electronic Signature(s) Signed: 04/28/2022 10:10:35 AM By: Heidi Coria RN Entered By: Heidi [name] on 04/26/2022 13:50:53 Radney, Heidi Pink ([id number]) -------------------------------------------------------------------------------- Encounter Discharge Information Details Patient Name: Heidi Papa T. Date of Service: 04/26/2022 1:15 PM Medical Record Number: [id number] Patient Account  Number: [id number] Date of Birth/Sex: January 31, 1956 (66 y.o. F) Treating RN: Heidi [name] Primary Care Yossi Hinchman: Heidi [name] Other Clinician: Referring Fannie Alomar: Heidi [name] Treating Avice Funchess/Extender: Heidi [name] in Treatment: 7 Encounter Discharge Information Items Post Procedure Vitals Discharge Condition: Stable Temperature (F): 98.2 Ambulatory Status: Ambulatory Pulse (bpm): 97 Discharge Destination: Home Respiratory Rate (breaths/min): 18 Transportation: Private Auto Blood Pressure (mmHg): 148/68 Accompanied By: self Schedule Follow-up Appointment: Yes Clinical Summary of Care: Electronic Signature(s) Signed: 04/28/2022 10:10:35 AM By: Heidi Coria RN Entered By: Heidi [name] on 04/26/2022 13:52:02 Ercole, Heidi Pink ([id number]) -------------------------------------------------------------------------------- Lower Extremity Assessment Details Patient Name: Heidi Papa T. Date of Service: 04/26/2022 1:15 PM Medical Record Number: [id number] Patient Account Number: [id number] Date of Birth/Sex: 1956/08/28 (66 y.o. F) Treating RN: Heidi [name] Primary Care Shepherd Finnan: Heidi [name] Other Clinician: Referring Holdyn Poyser: Heidi [name] Treating Areanna Gengler/Extender: Heidi [name] in Treatment: 7 Edema Assessment Assessed: [Left: No] [Right: No] Edema: [Left: Ye] [Right: s] Calf Left: Right: Point of Measurement: 35 cm From Medial Instep 30 cm Ankle Left: Right: Point of Measurement: 11 cm From Medial Instep 18 cm Knee To Floor Left: Right: From Medial Instep 46 cm Vascular Assessment Pulses: Dorsalis Pedis Palpable: [Right:Yes] Electronic Signature(s) Signed: 04/28/2022 10:10:35 AM By: Heidi Coria RN Entered By: Heidi [name] on 04/26/2022 13:25:46 Herald, Heidi Pink ([id number]) -------------------------------------------------------------------------------- Multi Wound Chart Details Patient Name: Heidi Papa T. Date of  Service: 04/26/2022 1:15 PM Medical Record Number: [id number] Patient Account Number: [id number] Date of Birth/Sex: [birth year] (66 y.o. F) Treating RN: Heidi [name] Primary Care Dannya Pitkin: Heidi [name] Other Clinician: Referring Jalexia Lalli: Heidi [name] Treating Huma Imhoff/Extender: Heidi [name] in Treatment: 7 Vital Signs Height(in): Pulse(bpm): 97 Weight(lbs): 150 Blood Pressure(mmHg): 148/68 Body Mass Index(BMI): Temperature(F): 98.2 Respiratory Rate(breaths/min): 18 Photos: [N/A:N/A] Wound Location: Right, Lateral Ankle N/A N/A Wounding Event: Gradually Appeared N/A N/A Primary Etiology: Venous Leg  Ulcer N/A N/A Comorbid History: Chronic Obstructive Pulmonary N/A N/A Disease (COPD), Hypotension Date Acquired: 11/30/2021 N/A N/A Weeks of Treatment: 7 N/A N/A Wound Status: Open N/A N/A Wound Recurrence: No N/A N/A Measurements L x W x D (cm) 0.1x0.1x0.1 N/A N/A Area (cm) : 0.008 N/A N/A Volume (cm) : 0.001 N/A N/A % Reduction in Area: 74.20% N/A N/A % Reduction in Volume: 83.30% N/A N/A Classification: Full Thickness Without Exposed N/A N/A Support Structures Exudate Amount: Medium N/A N/A Exudate Type: Serosanguineous N/A N/A Exudate Color: red, brown N/A N/A Granulation Amount: None Present (0%) N/A N/A Necrotic Amount: Large (67-100%) N/A N/A Epithelialization: None N/A N/A Treatment Notes Electronic Signature(s) Signed: 04/28/2022 10:10:35 AM By: Heidi Coria RN Entered By: Heidi [name] on 04/26/2022 13:25:56 Snowball, Heidi Pink ([id number]) -------------------------------------------------------------------------------- [place] Details Patient Name: Heidi Papa T. Date of Service: 04/26/2022 1:15 PM Medical Record Number: [id number] Patient Account Number: [id number] Date of Birth/Sex: August 06, 1956 (66 y.o. F) Treating RN: Heidi [name] Primary Care Jasslyn Finkel: Heidi [name] Other Clinician: Referring Atleigh Gruen: Heidi [name] Treating Lytle Malburg/Extender: Heidi [name] in Treatment: 7 Active Inactive Orientation to the Wound Care Program Nursing Diagnoses: Knowledge deficit related to the wound healing center program Goals: Patient/caregiver will verbalize understanding of the [place] Program Date Initiated: 03/08/2022 Target Resolution Date: 03/15/2022 Goal Status: Active Interventions: Provide education on orientation to the wound center Notes: Wound/Skin Impairment Nursing Diagnoses: Impaired tissue integrity Knowledge deficit related to ulceration/compromised skin integrity Goals: Ulcer/skin breakdown will have a volume reduction of 30% by week 4 Date Initiated: 03/08/2022 Target Resolution Date: 04/05/2022 Goal Status: Active Ulcer/skin breakdown will have a volume reduction of 50% by week 8 Date Initiated: 03/08/2022 Target Resolution Date: 05/03/2022 Goal Status: Active Ulcer/skin breakdown will have a volume reduction of 80% by week 12 Date Initiated: 03/08/2022 Target Resolution Date: 05/31/2022 Goal Status: Active Ulcer/skin breakdown will heal within 14 weeks Date Initiated: 03/08/2022 Target Resolution Date: 06/14/2022 Goal Status: Active Interventions: Assess patient/caregiver ability to obtain necessary supplies Assess patient/caregiver ability to perform ulcer/skin care regimen upon admission and as needed Assess ulceration(s) every visit Provide education on ulcer and skin care Notes: Electronic Signature(s) Signed: 04/28/2022 10:10:35 AM By: Heidi Coria RN Entered By: Heidi [name] on 04/26/2022 13:25:49 Deltoro, Heidi Pink ([id number]) -------------------------------------------------------------------------------- Pain Assessment Details Patient Name: Heidi Papa T. Date of Service: 04/26/2022 1:15 PM Medical Record Number: [id number] Patient Account Number: [id number] Date of Birth/Sex: 1955-12-25 (66 y.o. F) Treating RN: Heidi [name] Primary Care  Jarmel Linhardt: Heidi [name] Other Clinician: Referring Shamaya Kauer: Heidi [name] Treating Dejon Lukas/Extender: Heidi [name] in Treatment: 7 Active Problems Location of Pain Severity and Description of Pain Patient Has Paino No Site Locations Pain Management and Medication Current Pain Management: Electronic Signature(s) Signed: 04/28/2022 10:10:35 AM By: Heidi Coria RN Entered By: Heidi [name] on 04/26/2022 13:23:41 Manukyan, Heidi Pink ([id number]) -------------------------------------------------------------------------------- Patient/Caregiver Education Details Patient Name: Heidi Papa T. Date of Service: 04/26/2022 1:15 PM Medical Record Number: [id number] Patient Account Number: [id number] Date of Birth/Gender: 05-01-1956 (66 y.o. F) Treating RN: Heidi [name] Primary Care Physician: Heidi [name] Other Clinician: Referring Physician: Harold [name] Treating Physician/Extender: Heidi [name] in Treatment: 7 Education Assessment Education Provided To: Patient Education Topics Provided Wound/Skin Impairment: Methods: Explain/Verbal Responses: State content correctly Electronic Signature(s) Signed: 04/28/2022 10:10:35 AM By: Heidi Coria RN Entered By: Heidi [name] on 04/26/2022 13:51:12 [place]field, Heidi Pink ([id number]) -------------------------------------------------------------------------------- Wound Assessment Details Patient Name: Heidi Papa T. Date of Service: 04/26/2022 1:15 PM Medical Record Number: [id number]  Patient Account Number: [id number] Date of Birth/Sex: 1955/12/21 (66 y.o. F) Treating RN: Heidi [name] Primary Care Larose Batres: Heidi [name] Other Clinician: Referring Parry Po: Heidi [name] Treating Jshawn Hurta/Extender: Heidi [name] in Treatment: 7 Wound Status Wound Number: 1 Primary Venous Leg Ulcer Etiology: Wound Location: Right, Lateral Ankle Wound Status: Open Wounding Event: Gradually  Appeared Comorbid Chronic Obstructive Pulmonary Disease (COPD), Date Acquired: 11/30/2021 History: Hypotension Weeks Of Treatment: 7 Clustered Wound: No Photos Wound Measurements Length: (cm) 0.1 Width: (cm) 0.1 Depth: (cm) 0.1 Area: (cm) 0.008 Volume: (cm) 0.001 % Reduction in Area: 74.2% % Reduction in Volume: 83.3% Epithelialization: None Tunneling: No Undermining: No Wound Description Classification: Full Thickness Without Exposed Support Structures Exudate Amount: Medium Exudate Type: Serosanguineous Exudate Color: red, brown Foul Odor After Cleansing: No Slough/Fibrino Yes Wound Bed Granulation Amount: None Present (0%) Necrotic Amount: Large (67-100%) Necrotic Quality: Adherent Slough Treatment Notes Wound #1 (Ankle) Wound Laterality: Right, Lateral Cleanser [place] Ancillary Kit - 15 Day Supply Discharge Instruction: Use supplies as instructed; Kit contains: (15) Saline Bullets; (15) 3x3 Gauze; 15 pr Gloves Peri-Wound Care Topical Santyl Collagenase Ointment, 30 (gm), tube Discharge Instruction: apply nickel thick to wound bed only Tilmon, Lariza T. ([id number]) Primary Dressing (BORDER) Zetuvit Plus Silicone Border Dressing 4x4 (in/in) Secondary Dressing Secured With Compression Wrap Compression Stockings Add-Ons Electronic Signature(s) Signed: 04/28/2022 10:10:35 AM By: Heidi Coria RN Entered By: Heidi [name] on 04/26/2022 13:25:19 Mcparland, Heidi Pink ([id number]) -------------------------------------------------------------------------------- Vitals Details Patient Name: Heidi Papa T. Date of Service: 04/26/2022 1:15 PM Medical Record Number: [id number] Patient Account Number: [id number] Date of Birth/Sex: 1956-03-18 (66 y.o. F) Treating RN: Heidi [name] Primary Care Lyvonne Cassell: Heidi [name] Other Clinician: Referring Secilia Apps: Heidi [name] Treating Analeia Ismael/Extender: Heidi [name] in Treatment: 7 Vital Signs Time Taken:  13:22 Temperature (F): 98.2 Weight (lbs): 150 Pulse (bpm): 97 Respiratory Rate (breaths/min): 18 Blood Pressure (mmHg): 148/68 Reference Range: 80 - 120 mg / dl Electronic Signature(s) Signed: 04/28/2022 10:10:35 AM By: Heidi Coria RN Entered By: Heidi [name] on 04/26/2022 13:23:35

## 2022-04-28 NOTE — Progress Notes (Signed)
MARASIA, NEWHALL ([id number]) Visit Report for 04/26/2022 Chief Complaint Document Details Patient Name: TRELLIS, GUIRGUIS. Date of Service: 04/26/2022 1:15 PM Medical Record Number: [id number] Patient Account Number: [id number] Date of Birth/Sex: Nov 26, 1955 (66 y.o. F) Treating RN: Carlene Coria Primary Care Provider: Harold Barban Other Clinician: Referring Provider: Harold Barban Treating Provider/Extender: Yaakov Guthrie in Treatment: 7 Information Obtained from: Patient Chief Complaint 03/08/2022; right ankle wound Electronic Signature(s) Signed: 04/26/2022 2:03:38 PM By: Kalman Shan DO Entered By: Kalman Shan on 04/26/2022 14:00:46 Anstine, [name] ([id number]) -------------------------------------------------------------------------------- Debridement Details Patient Name: Wendall Papa T. Date of Service: 04/26/2022 1:15 PM Medical Record Number: [id number] Patient Account Number: [id number] Date of Birth/Sex: 08/10/1956 (66 y.o. F) Treating RN: Carlene Coria Primary Care Provider: Harold Barban Other Clinician: Referring Provider: Harold Barban Treating Provider/Extender: Yaakov Guthrie in Treatment: 7 Debridement Performed for Wound #1 Right,Lateral Ankle Assessment: Performed By: Physician Kalman Shan, MD Debridement Type: Chemical/Enzymatic/Mechanical Agent Used: Santyl Severity of Tissue Pre Debridement: Fat layer exposed Level of Consciousness (Pre- Awake and Alert procedure): Pre-procedure Verification/Time Out Yes - 13:49 Taken: Start Time: 13:49 Bleeding: None End Time: 13:50 Procedural Pain: 0 Post Procedural Pain: 0 Response to Treatment: Procedure was tolerated well Level of Consciousness (Post- Awake and Alert procedure): Post Debridement Measurements of Total Wound Length: (cm) 0.1 Width: (cm) 0.1 Depth: (cm) 0.1 Volume: (cm) 0.001 Character of Wound/Ulcer Post Debridement: Improved Severity of  Tissue Post Debridement: Fat layer exposed Post Procedure Diagnosis Same as Pre-procedure Electronic Signature(s) Signed: 04/26/2022 2:03:38 PM By: Kalman Shan DO Signed: 04/28/2022 10:10:35 AM By: Carlene Coria RN Entered By: Carlene Coria on 04/26/2022 13:50:26 Colucci, [name] ([id number]) -------------------------------------------------------------------------------- HPI Details Patient Name: Wendall Papa T. Date of Service: 04/26/2022 1:15 PM Medical Record Number: [id number] Patient Account Number: [id number] Date of Birth/Sex: [birth year] (66 y.o. F) Treating RN: Carlene Coria Primary Care Provider: Harold Barban Other Clinician: Referring Provider: Harold Barban Treating Provider/Extender: Yaakov Guthrie in Treatment: 7 History of Present Illness HPI Description: Admission 02/28/2022 Ms. Delma Drone is a 66 year old female with a past medical history of COPD on chronic oxygen that presents to the clinic for a 35-monthhistory of nonhealing ulcer to the right lateral malleolus. She is not sure how this started. She reports first being evaluated by podiatry. She was then referred to dermatology who has been following this wound for the past several months. She states she has an appointment next week with infectious disease for this issue. She has been using mupirocin ointment and DuoDERM every other day to the wound bed. She reports chronic pain to the area. She currently denies systemic signs of infection. 5/17; patient presents for follow-up. She is scheduled to have arterial studies on 6/9. She saw infectious disease today who obtained a wound culture however recommended only following up as needed. They will decide based on the culture results if she needs antibiotics. She has been using Santyl daily to the wound bed. She currently denies systemic signs of infection. She had an x-ray done at last clinic visit that showed no obvious signs of acute  osteomyelitis. 6/14; patient presents for follow-up. She had her ABI studies done that showed an ABI of 1.09 on the right with a TBI of 0.77. She had arterial studies that showed triphasic waveforms except for at the peroneal distal artery and this was monophasic. She has been using Santyl to the wound bed. She reports there is no growth to the wound culture done by infectious disease. She has  no issues or complaints today. She denies signs of infection. 6/28; patient presents for follow-up. She continues to use Santyl to the wound bed without any issues. She is scheduled to see vein and vascular on 7/18. Electronic Signature(s) Signed: 04/26/2022 2:03:38 PM By: Kalman Shan DO Entered By: Kalman Shan on 04/26/2022 14:01:13 Plush, [name] ([id number]) -------------------------------------------------------------------------------- Physical Exam Details Patient Name: Wendall Papa T. Date of Service: 04/26/2022 1:15 PM Medical Record Number: [id number] Patient Account Number: [id number] Date of Birth/Sex: Feb 28, 1956 (66 y.o. F) Treating RN: Carlene Coria Primary Care Provider: Harold Barban Other Clinician: Referring Provider: Harold Barban Treating Provider/Extender: Yaakov Guthrie in Treatment: 7 Constitutional . Psychiatric . Notes Right foot: To the lateral malleolus there is an open wound with a nonviable surface. No surrounding signs of infection. Difficult to palpate pedal pulses. Electronic Signature(s) Signed: 04/26/2022 2:03:38 PM By: Kalman Shan DO Entered By: Kalman Shan on 04/26/2022 14:01:46 Viney, [name] ([id number]) -------------------------------------------------------------------------------- Physician Orders Details Patient Name: Wendall Papa T. Date of Service: 04/26/2022 1:15 PM Medical Record Number: [id number] Patient Account Number: [id number] Date of Birth/Sex: [birth year] (66 y.o. F) Treating RN: Carlene Coria Primary Care Provider: Harold Barban Other Clinician: Referring Provider: Harold Barban Treating Provider/Extender: Yaakov Guthrie in Treatment: 7 Verbal / Phone Orders: No Diagnosis Coding Follow-up Appointments o Return Appointment in 1 month o Nurse Visit as needed Bathing/ Shower/ Hygiene o Wash wounds with antibacterial soap and water. o May shower; gently cleanse wound with antibacterial soap, rinse and pat dry prior to dressing wounds o No tub bath. Medications-Please add to medication list. o Santyl Enzymatic Ointment - please pick up from pharmacy and use as directed daily Wound Treatment Wound #1 - Ankle Wound Laterality: Right, Lateral Cleanser: Byram Ancillary Kit - 15 Day Supply (Generic) 1 x Per Day/30 Days Discharge Instructions: Use supplies as instructed; Kit contains: (15) Saline Bullets; (15) 3x3 Gauze; 15 pr Gloves Topical: Santyl Collagenase Ointment, 30 (gm), tube 1 x Per Day/30 Days Discharge Instructions: apply nickel thick to wound bed only Primary Dressing: (BORDER) Zetuvit Plus Silicone Border Dressing 4x4 (in/in) (Generic) 1 x Per Day/30 Days Electronic Signature(s) Signed: 04/26/2022 2:03:38 PM By: Kalman Shan DO Entered By: Kalman Shan on 04/26/2022 14:03:14 Sligar, Kortne T. ([id number]) -------------------------------------------------------------------------------- Problem List Details Patient Name: Wendall Papa T. Date of Service: 04/26/2022 1:15 PM Medical Record Number: [id number] Patient Account Number: [id number] Date of Birth/Sex: 26-Aug-1956 (66 y.o. 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 ([id number]) -------------------------------------------------------------------------------- Progress Note Details Patient Name: Wendall Papa T. Date of Service: 04/26/2022 1:15 PM Medical Record Number: [id number] Patient Account Number: [id number] Date of Birth/Sex: [birth year] (66 y.o. F) Treating RN: Carlene Coria Primary Care Provider: Harold Barban Other Clinician: Referring Provider: Harold Barban Treating Provider/Extender: Yaakov Guthrie in Treatment: 7 Subjective Chief Complaint Information obtained from Patient 03/08/2022; right ankle wound History of Present Illness (HPI) Admission 02/28/2022 Ms. Analaya Hoey is a 66 year old female with a past medical history of COPD on chronic oxygen that presents  to the clinic for a 46-monthhistory of nonhealing ulcer to the right lateral malleolus. She is not sure how this started. She reports first being evaluated by podiatry. She was then referred to dermatology who has been following this wound for the past several months. She states she has an appointment next week with infectious disease for this issue. She has been using mupirocin ointment and DuoDERM every other day to the wound bed. She reports chronic pain to the area. She currently denies systemic signs of infection. 5/17; patient presents for follow-up. She is scheduled to have arterial studies on 6/9. She saw infectious disease today who obtained a wound culture however recommended only following up as needed. They will decide based on the culture results if she needs antibiotics. She has been using Santyl  daily to the wound bed. She currently denies systemic signs of infection. She had an x-ray done at last clinic visit that showed no obvious signs of acute osteomyelitis. 6/14; patient presents for follow-up. She had her ABI studies done that showed an ABI of 1.09 on the right with a TBI of 0.77. She had arterial studies that showed triphasic waveforms except for at the peroneal distal artery and this was monophasic. She has been using Santyl to the wound bed. She reports there is no growth to the wound culture done by infectious disease. She has no issues or complaints today. She denies signs of infection. 6/28; patient presents for follow-up. She continues to use Santyl to the wound bed without any issues. She is scheduled to see vein and vascular on 7/18. Objective Constitutional Vitals Time Taken: 1:22 PM, Weight: 150 lbs, Temperature: 98.2 F, Pulse: 97 bpm, Respiratory Rate: 18 breaths/min, Blood Pressure: 148/68 mmHg. General Notes: Right foot: To the lateral malleolus there is an open wound with a nonviable surface. No surrounding signs of infection. Difficult to palpate pedal pulses. Integumentary (Hair, Skin) Wound #1 status is Open. Original cause of wound was Gradually Appeared. The date acquired was: 11/30/2021. The wound has been in treatment 7 weeks. The wound is located on the Right,Lateral Ankle. The wound measures 0.1cm length x 0.1cm width x 0.1cm depth; 0.008cm^2 area and 0.001cm^3 volume. There is no tunneling or undermining noted. There is a medium amount of serosanguineous drainage noted. There is no granulation within the wound bed. There is a large (67-100%) amount of necrotic tissue within the wound bed including Adherent Slough. Assessment Active Problems ICD-10 Non-pressure chronic ulcer of other part of right foot with other specified severity Chronic obstructive pulmonary disease, unspecified Peripheral vascular disease, unspecified Zuleta, Nashea T.  ([id number] Patient's wound has shown improvement in size and appearance since last clinic visit. No signs of surrounding infection. I recommended continuing Santyl. Follow-up in 2 weeks. 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 There was a Chemical/Enzymatic/Mechanical debridement performed by HKalman Shan MD.. Agent used was Santyl. A time out was conducted at 13:49, prior to the start of the procedure. There was no bleeding. The procedure was tolerated well with a pain level of 0 throughout and a pain level of 0 following the procedure. Post Debridement Measurements: 0.1cm length x 0.1cm width x 0.1cm depth; 0.001cm^3 volume. Character of Wound/Ulcer Post Debridement is improved. Severity of Tissue Post Debridement is: Fat layer exposed. Post procedure Diagnosis Wound #1: Same as Pre-Procedure Plan Follow-up Appointments: Return Appointment in 1 month Nurse Visit  as needed Midwife Hygiene: Wash wounds with antibacterial soap and water. May shower; gently cleanse wound with antibacterial soap, rinse and pat dry prior to dressing wounds No tub bath. Medications-Please add to medication list.: Santyl Enzymatic Ointment - please pick up from pharmacy and use as directed daily WOUND #1: - Ankle Wound Laterality: Right, Lateral Cleanser: Byram Ancillary Kit - 15 Day Supply (Generic) 1 x Per Day/30 Days Discharge Instructions: Use supplies as instructed; Kit contains: (15) Saline Bullets; (15) 3x3 Gauze; 15 pr Gloves Topical: Santyl Collagenase Ointment, 30 (gm), tube 1 x Per Day/30 Days Discharge Instructions: apply nickel thick to wound bed only Primary Dressing: (BORDER) Zetuvit Plus Silicone Border Dressing 4x4 (in/in) (Generic) 1 x Per Day/30 Days 1. Santyl daily 2. Follow-up in 2 weeks Electronic Signature(s) Signed: 04/26/2022 2:03:38 PM By:  Kalman Shan DO Entered By: Kalman Shan on 04/26/2022 14:02:45 Cui, [name] ([id number]) -------------------------------------------------------------------------------- SuperBill Details Patient Name: Wendall Papa T. Date of Service: 04/26/2022 Medical Record Number: [id number] Patient Account Number: [id number] Date of Birth/Sex: 17-Oct-1956 (66 y.o. F) Treating RN: Carlene Coria Primary Care Provider: Harold Barban Other Clinician: Referring Provider: Harold Barban Treating Provider/Extender: Yaakov Guthrie in Treatment: 7 Diagnosis Coding ICD-10 Codes Code Description [phone] Non-pressure chronic ulcer of other part of right foot with other specified severity J44.9 Chronic obstructive pulmonary disease, unspecified I73.9 Peripheral vascular disease, unspecified Facility Procedures CPT4 Code: [id number] Description: [facility] VISIT-LEV 3 EST PT Modifier: Quantity: 1 CPT4 Code: [id number] Description: 46047 - [facility] W/O ANES NON SELECT Modifier: Quantity: 1 Physician Procedures CPT4 Code: 9987215 Description: 87276 - WC PHYS LEVEL 3 - EST PT Modifier: Quantity: 1 CPT4 Code: Description: ICD-10 Diagnosis Description L97.518 Non-pressure chronic ulcer of other part of right foot with other specif J44.9 Chronic obstructive pulmonary disease, unspecified I73.9 Peripheral vascular disease, unspecified Modifier: ied severity Quantity: Electronic Signature(s) Signed: 04/26/2022 2:03:38 PM By: Kalman Shan DO Entered By: Kalman Shan on 04/26/2022 14:03:03

## 2022-05-10 ENCOUNTER — Encounter: Payer: Medicare Other | Attending: Internal Medicine | Admitting: Internal Medicine

## 2022-05-10 DIAGNOSIS — I739 Peripheral vascular disease, unspecified: Secondary | ICD-10-CM | POA: Diagnosis not present

## 2022-05-10 DIAGNOSIS — G8929 Other chronic pain: Secondary | ICD-10-CM | POA: Diagnosis not present

## 2022-05-10 DIAGNOSIS — L97518 Non-pressure chronic ulcer of other part of right foot with other specified severity: Secondary | ICD-10-CM | POA: Insufficient documentation

## 2022-05-10 DIAGNOSIS — J449 Chronic obstructive pulmonary disease, unspecified: Secondary | ICD-10-CM | POA: Diagnosis not present

## 2022-05-12 NOTE — Progress Notes (Signed)
JOLIET, MALLOZZI ([id number]) Visit Report for 05/10/2022 Chief Complaint Document Details Patient Name: Heidi [name], Heidi T. Date of Service: 05/10/2022 11:00 AM Medical Record Number: [id number] Patient Account Number: [id number] Date of Birth/Sex: 15-Feb-1956 (66 y.o. F) Treating RN: Yevonne Pax Primary Care Provider: Reino Kent Other Clinician: Referring Provider: Reino Kent Treating Provider/Extender: Tilda Franco in Treatment: 9 Information Obtained from: Patient Chief Complaint 03/08/2022; right ankle wound Electronic Signature(s) Signed: 05/10/2022 12:00:10 PM By: Geralyn Corwin DO Entered By: Geralyn Corwin on 05/10/2022 11:23:17 Ma, Ellwood Dense ([id number]) -------------------------------------------------------------------------------- HPI Details Patient Name: Heidi Links T. Date of Service: 05/10/2022 11:00 AM Medical Record Number: [id number] Patient Account Number: [id number] Date of Birth/Sex: May 18, 1956 (66 y.o. F) Treating RN: Yevonne Pax Primary Care Provider: Reino Kent Other Clinician: Referring Provider: Reino Kent Treating Provider/Extender: Tilda Franco in Treatment: 9 History of Present Illness HPI Description: Admission 02/28/2022 Ms. Heidi [name] is a 66 year old female with a past medical history of COPD on chronic oxygen that presents to the clinic for a 5-month history of nonhealing ulcer to the right lateral malleolus. She is not sure how this started. She reports first being evaluated by podiatry. She was then referred to dermatology who has been following this wound for the past several months. She states she has an appointment next week with infectious disease for this issue. She has been using mupirocin ointment and DuoDERM every other day to the wound bed. She reports chronic pain to the area. She currently denies systemic signs of infection. 5/17; patient presents for follow-up. She is scheduled  to have arterial studies on 6/9. She saw infectious disease today who obtained a wound culture however recommended only following up as needed. They will decide based on the culture results if she needs antibiotics. She has been using Santyl daily to the wound bed. She currently denies systemic signs of infection. She had an x-ray done at last clinic visit that showed no obvious signs of acute osteomyelitis. 6/14; patient presents for follow-up. She had her ABI studies done that showed an ABI of 1.09 on the right with a TBI of 0.77. She had arterial studies that showed triphasic waveforms except for at the peroneal distal artery and this was monophasic. She has been using Santyl to the wound bed. She reports there is no growth to the wound culture done by infectious disease. She has no issues or complaints today. She denies signs of infection. 6/28; patient presents for follow-up. She continues to use Santyl to the wound bed without any issues. She is scheduled to see vein and vascular on 7/18. 7/12; patient presents for follow-up. She has been using Santyl to the wound bed without any issues. She has noticed no drainage for the past 2 weeks. Electronic Signature(s) Signed: 05/10/2022 12:00:10 PM By: Geralyn Corwin DO Entered By: Geralyn Corwin on 05/10/2022 11:23:48 Heymann, Ellwood Dense ([id number]) -------------------------------------------------------------------------------- Physical Exam Details Patient Name: Heidi Links T. Date of Service: 05/10/2022 11:00 AM Medical Record Number: [id number] Patient Account Number: [id number] Date of Birth/Sex: 30-Dec-1955 (66 y.o. F) Treating RN: Yevonne Pax Primary Care Provider: Reino Kent Other Clinician: Referring Provider: Reino Kent Treating Provider/Extender: Tilda Franco in Treatment: 9 Constitutional . Psychiatric . Notes Right foot: To the lateral malleolus there is epithelization to the previous wound site.  No drainage noted. No signs of surrounding infection. Surrounding skin intact. Electronic Signature(s) Signed: 05/10/2022 12:00:10 PM By: Geralyn Corwin DO Entered By: Geralyn Corwin on 05/10/2022 11:24:26 Banes, Ellwood Dense ([id number]) -------------------------------------------------------------------------------- Physician Orders  Details Patient Name: Heidi [name]. Date of Service: 05/10/2022 11:00 AM Medical Record Number: [id number] Patient Account Number: [id number] Date of Birth/Sex: 1956/09/29 (66 y.o. F) Treating RN: Yevonne Pax Primary Care Provider: Reino Kent Other Clinician: Referring Provider: Reino Kent Treating Provider/Extender: Tilda Franco in Treatment: 9 Verbal / Phone Orders: No Diagnosis Coding Discharge From [facility] Services o Discharge from Wound Care Center Treatment Complete - apply protection Electronic Signature(s) Signed: 05/10/2022 12:00:10 PM By: Geralyn Corwin DO Entered By: Geralyn Corwin on 05/10/2022 11:26:05 Lerch, Ellwood Dense ([id number]) -------------------------------------------------------------------------------- Problem List Details Patient Name: Heidi Links T. Date of Service: 05/10/2022 11:00 AM Medical Record Number: [id number] Patient Account Number: [id number] Date of Birth/Sex: Jul 27, 1956 (66 y.o. 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 ([id number]) -------------------------------------------------------------------------------- Progress Note Details Patient Name: Heidi Links T. Date of Service: 05/10/2022 11:00 AM Medical Record Number: [id number] Patient Account Number: [id number] Date of Birth/Sex: [birth year] (66 y.o. F) Treating RN: Yevonne Pax Primary Care Provider: Reino Kent Other Clinician: Referring Provider: Reino Kent Treating Provider/Extender: Tilda Franco in Treatment: 9 Subjective Chief Complaint Information obtained from Patient 03/08/2022; right ankle wound History of Present Illness (HPI) Admission 02/28/2022 Ms. Ramani Riva is a 66 year old female with a past medical history of COPD on chronic oxygen that presents to the clinic for a 62-month history of nonhealing ulcer to the right lateral malleolus. She is not sure how this started. She reports first being evaluated by podiatry. She was then referred to dermatology who has been following this wound for the past several months. She states she has an appointment next week with infectious disease for this issue. She has been using mupirocin ointment and DuoDERM every other day to the wound bed. She reports chronic pain to the area. She currently denies systemic signs of infection. 5/17; patient presents for follow-up. She is scheduled to have arterial studies on 6/9. She saw infectious disease today who obtained a wound culture however recommended only following up as needed. They will decide based on the culture results if she needs antibiotics. She has been using Santyl daily to the wound bed. She currently denies systemic signs of infection. She had an x-ray done at last clinic visit that showed no obvious signs of acute osteomyelitis. 6/14; patient presents for follow-up. She had her ABI studies done that showed an ABI of 1.09  on the right with a TBI of 0.77. She had arterial studies that showed triphasic waveforms except for at the peroneal distal artery and this was monophasic. She has been using Santyl to the wound bed. She reports there is no growth to the wound culture done by infectious disease. She has no issues or complaints today. She denies signs of infection. 6/28; patient presents for follow-up. She continues to use Santyl to the wound bed without any issues. She is scheduled to see vein and vascular on 7/18. 7/12; patient presents for follow-up. She has been using Santyl to the wound bed without any issues. She has noticed no  drainage for the past 2 weeks. Objective Constitutional Vitals Time Taken: 10:58 AM, Weight: 150 lbs, Temperature: 98 F, Pulse: 99 bpm, Respiratory Rate: 18 breaths/min, Blood Pressure: 167/83 mmHg. General Notes: Right foot: To the lateral malleolus there is epithelization to the previous wound site. No drainage noted. No signs of surrounding infection. Surrounding skin intact. Integumentary (Hair, Skin) Wound #1 status is Healed - Epithelialized. Original cause of wound was Gradually Appeared. The date acquired was: 11/30/2021. The wound has been in treatment 9 weeks. The wound is located on the Right,Lateral Ankle. The wound measures 0cm length x 0cm width x 0cm depth; 0cm^2 area and 0cm^3 volume. There is no tunneling or undermining noted. There is a none present amount of drainage noted. There is no granulation within the wound bed. There is no necrotic tissue within the wound bed. Assessment Active Problems ICD-10 Non-pressure chronic ulcer of other part of right foot with other specified severity Chronic obstructive pulmonary disease, unspecified Haring, Timmy T. ([id number]) Peripheral vascular disease, unspecified Patient has done well with Santyl. Her wound is closed today. I recommended keeping the area protected Daily for at least the next 2 weeks. At this time I  do not think she needs to follow-up with vein and vascular for this issue. Follow-up as needed. Plan Discharge From [facility] Services: Discharge from Wound Care Center Treatment Complete - apply protection 1. Discharge from clinic due to closed wound 2. Follow-up as needed Electronic Signature(s) Signed: 05/10/2022 12:00:10 PM By: Geralyn Corwin DO Entered By: Geralyn Corwin on 05/10/2022 11:25:53 Sofia, Ellwood Dense ([id number]) -------------------------------------------------------------------------------- ROS/PFSH Details Patient Name: Heidi Links T. Date of Service: 05/10/2022 11:00 AM Medical Record Number: [id number] Patient Account Number: [id number] Date of Birth/Sex: February 08, 1956 (66 y.o. F) Treating RN: Yevonne Pax Primary Care Provider: Reino Kent Other Clinician: Referring Provider: Reino Kent Treating Provider/Extender: Tilda Franco in Treatment: 9 Information Obtained From Patient Respiratory Medical History: Positive for: Chronic Obstructive Pulmonary Disease (COPD) Cardiovascular Medical History: Positive for: Hypotension Immunizations Pneumococcal Vaccine: Received Pneumococcal Vaccination: Yes Received Pneumococcal Vaccination On or After 60th Birthday: Yes Implantable Devices None Family and Social History Former smoker - ended on 01/21/2012; Alcohol Use: Rarely; Drug Use: No History; Caffeine Use: Daily - soda Electronic Signature(s) Signed: 05/10/2022 12:00:10 PM By: Geralyn Corwin DO Signed: 05/12/2022 10:45:13 AM By: Yevonne Pax RN Entered By: Geralyn Corwin on 05/10/2022 11:26:13 Rayon, Ellwood Dense ([id number]) -------------------------------------------------------------------------------- SuperBill Details Patient Name: Heidi Links T. Date of Service: 05/10/2022 Medical Record Number: [id number] Patient Account Number: [id number] Date of Birth/Sex: 04-25-1956 (66 y.o. F) Treating RN: Yevonne Pax Primary Care Provider:  Reino Kent Other Clinician: Referring Provider: Reino Kent Treating Provider/Extender: Tilda Franco in Treatment: 9 Diagnosis Coding ICD-10 Codes Code Description [phone] Non-pressure chronic ulcer of other part of right foot with other specified severity J44.9 Chronic obstructive pulmonary disease, unspecified I73.9 Peripheral vascular disease, unspecified Facility Procedures CPT4 Code: [id number] Description: [phone] - WOUND CARE VISIT-LEV 2 EST PT Modifier: Quantity: 1 Physician Procedures CPT4 Code: [id number] Description: 99213 - WC PHYS LEVEL 3 - EST PT Modifier: Quantity: 1 CPT4 Code: Description: ICD-10 Diagnosis Description L97.518 Non-pressure chronic ulcer of other part of right foot with other specif J44.9 Chronic obstructive pulmonary disease, unspecified I73.9 Peripheral vascular disease, unspecified Modifier: ied severity Quantity: Electronic Signature(s) Signed: 05/10/2022 12:00:10 PM By: Geralyn Corwin DO Entered By: Geralyn Corwin on 05/10/2022 11:25:58

## 2022-05-12 NOTE — Progress Notes (Signed)
Heidi, [name] ([id number]) Visit Report for 05/10/2022 Arrival Information Details Patient Name: Heidi [name], Heidi [name]. Date of Service: 05/10/2022 11:00 AM Medical Record Number: [id number] Patient Account Number: [id number] Date of Birth/Sex: 23-Mar-1956 (66 y.o. F) Treating RN: Yevonne Pax Primary Care Clyde Upshaw: Reino Kent Other Clinician: Referring Guila Owensby: Reino Kent Treating Kelseigh Diver/Extender: Tilda Franco in Treatment: 9 Visit Information History Since Last Visit All ordered tests and consults were completed: No Patient Arrived: Ambulatory Added or deleted any medications: No Arrival Time: 10:54 Any new allergies or adverse reactions: No Accompanied By: self Had a fall or experienced change in No Transfer Assistance: None activities of daily living that may affect Patient Identification Verified: Yes risk of falls: Secondary Verification Process Completed: Yes Signs or symptoms of abuse/neglect since last visito No Patient Requires Transmission-Based No Hospitalized since last visit: No Precautions: Implantable device outside of the clinic excluding No Patient Has Alerts: Yes cellular tissue based products placed in the center Patient Alerts: ABI 04/07/22 L 1.14 R since last visit: 1.09 Has Dressing in Place as Prescribed: Yes TBI 04/07/22 L 0.81 R Pain Present Now: No 0.77 Electronic Signature(s) Signed: 05/12/2022 10:45:13 AM By: Yevonne Pax RN Entered By: Yevonne Pax on 05/10/2022 10:57:55 Wunschel, Ellwood Dense ([id number]) -------------------------------------------------------------------------------- Clinic Level of Care Assessment Details Patient Name: Heidi Links T. Date of Service: 05/10/2022 11:00 AM Medical Record Number: [id number] Patient Account Number: [id number] Date of Birth/Sex: 09/13/1956 (66 y.o. F) Treating RN: Yevonne Pax Primary Care Aveena Bari: Reino Kent Other Clinician: Referring Marlei Glomski: Reino Kent Treating Garrick Midgley/Extender: Tilda Franco in Treatment: 9 Clinic Level of Care Assessment Items TOOL 4 Quantity Score X - Use when only an EandM is performed on FOLLOW-UP visit 1 0 ASSESSMENTS - Nursing Assessment / Reassessment X - Reassessment of Co-morbidities (includes updates in patient status) 1 10 X- 1 5 Reassessment of Adherence to Treatment Plan ASSESSMENTS - Wound and Skin Assessment / Reassessment X - Simple Wound Assessment / Reassessment - one wound 1 5 []  - 0 Complex Wound Assessment / Reassessment - multiple wounds []  - 0 Dermatologic / Skin Assessment (not related to wound area) ASSESSMENTS - Focused Assessment []  - Circumferential Edema Measurements - multi extremities 0 []  - 0 Nutritional Assessment / Counseling / Intervention []  - 0 Lower Extremity Assessment (monofilament, tuning fork, pulses) []  - 0 Peripheral Arterial Disease Assessment (using hand held doppler) ASSESSMENTS - Ostomy and/or Continence Assessment and Care []  - Incontinence Assessment and Management 0 []  - 0 Ostomy Care Assessment and Management (repouching, etc.) PROCESS - Coordination of Care X - Simple Patient / Family Education for ongoing care 1 15 []  - 0 Complex (extensive) Patient / Family Education for ongoing care []  - 0 Staff obtains , Records, Test Results / Process Orders []  - 0 Staff telephones HHA, Nursing Homes / Clarify orders / etc []  - 0 Routine Transfer to another Facility (non-emergent condition) []  - 0 Routine Hospital Admission (non-emergent condition) []  - 0 New Admissions / / Ordering NPWT, Apligraf, etc. []  - 0 Emergency Hospital Admission (emergent condition) X- 1 10 Simple Discharge Coordination []  - 0 Complex (extensive) Discharge Coordination PROCESS - Special Needs []  - Pediatric / Minor Patient Management 0 []  - 0 Isolation Patient Management []  - 0 Hearing / Language / Visual special needs []  -  0 Assessment of Community assistance (transportation, D/C planning, etc.) []  - 0 Additional assistance / Altered mentation []  - 0 Support Surface(s) Assessment (bed, cushion, seat, etc.) INTERVENTIONS - Wound  Cleansing / Measurement Camey, Yatzary T. ([id number]) X- 1 5 Simple Wound Cleansing - one wound []  - 0 Complex Wound Cleansing - multiple wounds X- 1 5 Wound Imaging (photographs - any number of wounds) []  - 0 Wound Tracing (instead of photographs) X- 1 5 Simple Wound Measurement - one wound []  - 0 Complex Wound Measurement - multiple wounds INTERVENTIONS - Wound Dressings X - Small Wound Dressing one or multiple wounds 1 10 []  - 0 Medium Wound Dressing one or multiple wounds []  - 0 Large Wound Dressing one or multiple wounds []  - 0 Application of Medications - topical []  - 0 Application of Medications - injection INTERVENTIONS - Miscellaneous []  - External ear exam 0 []  - 0 Specimen Collection (cultures, biopsies, blood, body fluids, etc.) []  - 0 Specimen(s) / Culture(s) sent or taken to Lab for analysis []  - 0 Patient Transfer (multiple staff / / Similar devices) []  - 0 Simple Staple / Suture removal (25 or less) []  - 0 Complex Staple / Suture removal (26 or more) []  - 0 Hypo / Hyperglycemic Management (close monitor of Blood Glucose) []  - 0 Ankle / Brachial Index (ABI) - do not check if billed separately X- 1 5 Vital Signs Has the patient been seen at the hospital within the last three years: Yes Total Score: 75 Level Of Care: New/Established - Level 2 Electronic Signature(s) Signed: 05/12/2022 10:45:13 AM By: RN Entered By: on 05/10/2022 11:22:18 Luth, ( ) -------------------------------------------------------------------------------- Encounter Discharge Information Details Patient Name: T. Date of Service: 05/10/2022 11:00 AM Medical Record Number: Patient Account  Number: Date of Birth/Sex: [birth year] (66 y.o. F) Treating RN: Primary Care Adahlia Stembridge: Other Clinician: Referring Samuele Storey: Treating Karlen Barbar/Extender: 05/14/2022 in Treatment: 9 Encounter Discharge Information Items Discharge Condition: Stable Ambulatory Status: Ambulatory Discharge Destination: Home Transportation: Private Auto Accompanied By: self Schedule Follow-up Appointment: Yes Clinical Summary of Care: Electronic Signature(s) Signed: 05/12/2022 10:45:13 AM By: Yevonne Pax RN Entered By: 07/11/2022 on 05/10/2022 11:24:51 Rupnow, [id number] (Heidi Links) -------------------------------------------------------------------------------- Lower Extremity Assessment Details Patient Name: 07/11/2022 T. Date of Service: 05/10/2022 11:00 AM Medical Record Number: [id number] Patient Account Number: 04/26/1956 Date of Birth/Sex: 05/05/1956 (66 y.o. F) Treating RN: Reino Kent Primary Care Kailen Name: Reino Kent Other Clinician: Referring Paraskevi Funez: Tilda Franco Treating Johnryan Sao/Extender: 05/14/2022 in Treatment: 9 Electronic Signature(s) Signed: 05/12/2022 10:45:13 AM By: Yevonne Pax RN Entered By: 07/11/2022 on 05/10/2022 10:59:42 Buckalew, [id number] (Heidi Links) -------------------------------------------------------------------------------- Multi Wound Chart Details Patient Name: 07/11/2022 T. Date of Service: 05/10/2022 11:00 AM Medical Record Number: [id number] Patient Account Number: 04/26/1956 Date of Birth/Sex: December 14, 1955 (66 y.o. F) Treating RN: Reino Kent Primary Care Titania Gault: Reino Kent Other Clinician: Referring Jericha Bryden: Tilda Franco Treating Manford Sprong/Extender: 05/14/2022 in Treatment: 9 Vital Signs Height(in): Pulse(bpm): 99 Weight(lbs): 150 Blood Pressure(mmHg): 167/83 Body Mass Index(BMI): Temperature(F): 98 Respiratory  Rate(breaths/min): 18 Photos: [N/A:N/A] Wound Location: Right, Lateral Ankle N/A N/A Wounding Event: Gradually Appeared N/A N/A Primary Etiology: Venous Leg Ulcer N/A N/A Comorbid History: Chronic Obstructive Pulmonary N/A N/A Disease (COPD), Hypotension Date Acquired: 11/30/2021 N/A N/A Weeks of Treatment: 9 N/A N/A Wound Status: Healed - Epithelialized N/A N/A Wound Recurrence: No N/A N/A Measurements L x W x D (cm) 0x0x0 N/A N/A Area (cm) : 0 N/A N/A Volume (cm) : 0 N/A N/A % Reduction in Area: 100.00% N/A N/A % Reduction in Volume: 100.00% N/A N/A  Classification: Full Thickness Without Exposed N/A N/A Support Structures Exudate Amount: None Present N/A N/A Granulation Amount: None Present (0%) N/A N/A Necrotic Amount: None Present (0%) N/A N/A Exposed Structures: Fascia: No N/A N/A Fat Layer (Subcutaneous Tissue): No Tendon: No Muscle: No Joint: No Bone: No Epithelialization: Large (67-100%) N/A N/A Treatment Notes Electronic Signature(s) Signed: 05/12/2022 10:45:13 AM By: Yevonne Pax RN Entered By: Yevonne Pax on 05/10/2022 11:21:29 Raimer, Ellwood Dense ([id number]) -------------------------------------------------------------------------------- Multi-Disciplinary Care Plan Details Patient Name: Heidi Links T. Date of Service: 05/10/2022 11:00 AM Medical Record Number: [id number] Patient Account Number: [id number] Date of Birth/Sex: 1956/10/12 (66 y.o. F) Treating RN: Yevonne Pax Primary Care Angele Wiemann: Reino Kent Other Clinician: Referring Luigi Stuckey: Reino Kent Treating Laban Orourke/Extender: Tilda Franco in Treatment: 9 Active Inactive Electronic Signature(s) Signed: 05/12/2022 10:45:13 AM By: Yevonne Pax RN Entered By: Yevonne Pax on 05/10/2022 11:21:21 Gulino, Ellwood Dense ([id number]) -------------------------------------------------------------------------------- Pain Assessment Details Patient Name: Heidi Links T. Date of Service:  05/10/2022 11:00 AM Medical Record Number: [id number] Patient Account Number: [id number] Date of Birth/Sex: 27-Apr-1956 (66 y.o. F) Treating RN: Yevonne Pax Primary Care Fedrick Cefalu: Reino Kent Other Clinician: Referring Dimples Probus: Reino Kent Treating Elira Colasanti/Extender: Tilda Franco in Treatment: 9 Active Problems Location of Pain Severity and Description of Pain Patient Has Paino No Site Locations Pain Management and Medication Current Pain Management: Electronic Signature(s) Signed: 05/12/2022 10:45:13 AM By: Yevonne Pax RN Entered By: Yevonne Pax on 05/10/2022 10:58:23 Thissen, Ellwood Dense ([id number]) -------------------------------------------------------------------------------- Patient/Caregiver Education Details Patient Name: Heidi Links T. Date of Service: 05/10/2022 11:00 AM Medical Record Number: [id number] Patient Account Number: [id number] Date of Birth/Gender: 1956-07-07 (66 y.o. F) Treating RN: Yevonne Pax Primary Care Physician: Reino Kent Other Clinician: Referring Physician: Reino Kent Treating Physician/Extender: Tilda Franco in Treatment: 9 Education Assessment Education Provided To: Patient Education Topics Provided Wound/Skin Impairment: Methods: Explain/Verbal Responses: State content correctly Electronic Signature(s) Signed: 05/12/2022 10:45:13 AM By: Yevonne Pax RN Entered By: Yevonne Pax on 05/10/2022 11:22:31 Munley, Ellwood Dense ([id number]) -------------------------------------------------------------------------------- Wound Assessment Details Patient Name: Heidi Links T. Date of Service: 05/10/2022 11:00 AM Medical Record Number: [id number] Patient Account Number: [id number] Date of Birth/Sex: 29-Mar-1956 (66 y.o. F) Treating RN: Yevonne Pax Primary Care Maziyah Vessel: Reino Kent Other Clinician: Referring Charnel Giles: Reino Kent Treating Charlottie Peragine/Extender: Tilda Franco in  Treatment: 9 Wound Status Wound Number: 1 Primary Venous Leg Ulcer Etiology: Wound Location: Right, Lateral Ankle Wound Status: Healed - Epithelialized Wounding Event: Gradually Appeared Comorbid Chronic Obstructive Pulmonary Disease (COPD), Date Acquired: 11/30/2021 History: Hypotension Weeks Of Treatment: 9 Clustered Wound: No Photos Wound Measurements Length: (cm) 0 Width: (cm) 0 Depth: (cm) 0 Area: (cm) 0 Volume: (cm) 0 % Reduction in Area: 100% % Reduction in Volume: 100% Epithelialization: Large (67-100%) Tunneling: No Undermining: No Wound Description Classification: Full Thickness Without Exposed Support Structure Exudate Amount: None Present s Foul Odor After Cleansing: No Slough/Fibrino No Wound Bed Granulation Amount: None Present (0%) Exposed Structure Necrotic Amount: None Present (0%) Fascia Exposed: No Fat Layer (Subcutaneous Tissue) Exposed: No Tendon Exposed: No Muscle Exposed: No Joint Exposed: No Bone Exposed: No Treatment Notes Wound #1 (Ankle) Wound Laterality: Right, Lateral Cleanser Peri-Wound Care Topical Primary Dressing JAYLN, BRANSCOM ([id number]) Secondary Dressing Secured With Compression Wrap Compression Stockings Add-Ons Electronic Signature(s) Signed: 05/12/2022 10:45:13 AM By: Yevonne Pax RN Entered By: Yevonne Pax on 05/10/2022 11:20:56 Fedak, Ellwood Dense ([id number]) -------------------------------------------------------------------------------- Vitals Details Patient Name: Heidi Links T. Date of Service: 05/10/2022 11:00 AM Medical Record Number: [id number] Patient Account Number: [id number]  Date of Birth/Sex: 03/27/1956 (66 y.o. F) Treating RN: Yevonne Pax Primary Care Viann Nielson: Reino Kent Other Clinician: Referring Sharne Linders: Reino Kent Treating Elisa Kutner/Extender: Tilda Franco in Treatment: 9 Vital Signs Time Taken: 10:58 Temperature (F): 98 Weight (lbs): 150 Pulse (bpm):  99 Respiratory Rate (breaths/min): 18 Blood Pressure (mmHg): 167/83 Reference Range: 80 - 120 mg / dl Electronic Signature(s) Signed: 05/12/2022 10:45:13 AM By: Yevonne Pax RN Entered By: Yevonne Pax on 05/10/2022 10:58:17

## 2022-05-16 ENCOUNTER — Ambulatory Visit (INDEPENDENT_AMBULATORY_CARE_PROVIDER_SITE_OTHER): Payer: Medicare Other | Admitting: Nurse Practitioner

## 2022-06-28 ENCOUNTER — Encounter: Payer: Medicare Other | Attending: Internal Medicine | Admitting: *Deleted

## 2022-06-28 ENCOUNTER — Encounter: Payer: Self-pay | Admitting: *Deleted

## 2022-06-28 DIAGNOSIS — J449 Chronic obstructive pulmonary disease, unspecified: Secondary | ICD-10-CM | POA: Insufficient documentation

## 2022-06-28 NOTE — Progress Notes (Signed)
Virtual orientation call completed today. shehas an appointment on Date: 07/10/2022  for EP eval and gym Orientation.  Documentation of diagnosis can be found in [facility] Date: 05/03/2022 .

## 2022-07-10 ENCOUNTER — Encounter: Payer: Medicare Other | Attending: Internal Medicine | Admitting: *Deleted

## 2022-07-10 VITALS — Ht 65.0 in | Wt 151.3 lb

## 2022-07-10 DIAGNOSIS — J449 Chronic obstructive pulmonary disease, unspecified: Secondary | ICD-10-CM | POA: Insufficient documentation

## 2022-07-10 NOTE — Patient Instructions (Signed)
Patient Instructions  Patient Details  Name: Heidi [name] MRN: [id number] Date of Birth: [birth year] Referring Provider:  Vida Rigger, MD  Below are your personal goals for exercise, nutrition, and risk factors. Our goal is to help you stay on track towards obtaining and maintaining these goals. We will be discussing your progress on these goals with you throughout the program.  Initial Exercise Prescription:  Initial Exercise Prescription - 07/10/22 1600       Date of Initial Exercise RX and Referring Provider   Date 07/10/22    Referring Provider Aleskerov      Oxygen   Oxygen Continuous    Liters 3    Maintain Oxygen Saturation 88% or higher      Treadmill   MPH 1    Grade 0    Minutes 15    METs 1.8      Recumbant Bike   Level 1    RPM 50    Minutes 15    METs 2.72      NuStep   Level 1    SPM 80    Minutes 15    METs 2.72      REL-XR   Level 1    Speed 50    Minutes 15    METs 2.72      T5 Nustep   Level 1    SPM 60    Minutes 15    METs 2.72      Biostep-RELP   Level 1    SPM 50    Minutes 15    METs 2.72      Track   Laps 15    Minutes 15    METs 1.82      Prescription Details   Frequency (times per week) 3    Duration Progress to 30 minutes of continuous aerobic without signs/symptoms of physical distress      Intensity   THRR 40-80% of Max Heartrate 116-141    Ratings of Perceived Exertion 11-13    Perceived Dyspnea 0-4      Progression   Progression Continue to progress workloads to maintain intensity without signs/symptoms of physical distress.      Resistance Training   Training Prescription Yes    Weight 3    Reps 10-15             Exercise Goals: Frequency: Be able to perform aerobic exercise two to three times per week in program working toward 2-5 days per week of home exercise.  Intensity: Work with a perceived exertion of 11 (fairly light) - 15 (hard) while following your exercise prescription.  We will  make changes to your prescription with you as you progress through the program.   Duration: Be able to do 30 to 45 minutes of continuous aerobic exercise in addition to a 5 minute warm-up and a 5 minute cool-down routine.   Nutrition Goals: Your personal nutrition goals will be established when you do your nutrition analysis with the dietician.  The following are general nutrition guidelines to follow: Cholesterol < 200mg /day Sodium < 1500mg /day Fiber: Women over 50 yrs - 21 grams per day  Personal Goals:  Personal Goals and Risk Factors at Admission - 07/10/22 1640       Core Components/Risk Factors/Patient Goals on Admission    Weight Management Yes    Intervention Weight Management: Develop a combined nutrition and exercise program designed to reach desired caloric intake, while maintaining appropriate intake of nutrient and  fiber, sodium and fats, and appropriate energy expenditure required for the weight goal.;Weight Management: Provide education and appropriate resources to help participant work on and attain dietary goals.    Admit Weight 151 lb 4.8 oz (68.6 kg)    Goal Weight: Short Term 150 lb (68 kg)    Goal Weight: Long Term 150 lb (68 kg)    Expected Outcomes Short Term: Continue to assess and modify interventions until short term weight is achieved;Long Term: Adherence to nutrition and physical activity/exercise program aimed toward attainment of established weight goal;Weight Maintenance: Understanding of the daily nutrition guidelines, which includes 25-35% calories from fat, 7% or less cal from saturated fats, less than 200mg  cholesterol, less than 1.5gm of sodium, & 5 or more servings of fruits and vegetables daily;Understanding of distribution of calorie intake throughout the day with the consumption of 4-5 meals/snacks;Understanding recommendations for meals to include 15-35% energy as protein, 25-35% energy from fat, 35-60% energy from carbohydrates, less than 200mg  of  dietary cholesterol, 20-35 gm of total fiber daily    Improve shortness of breath with ADL's Yes    Intervention Provide education, individualized exercise plan and daily activity instruction to help decrease symptoms of SOB with activities of daily living.    Expected Outcomes Short Term: Improve cardiorespiratory fitness to achieve a reduction of symptoms when performing ADLs;Long Term: Be able to perform more ADLs without symptoms or delay the onset of symptoms    Increase knowledge of respiratory medications and ability to use respiratory devices properly  Yes    Intervention Provide education and demonstration as needed of appropriate use of medications, inhalers, and oxygen therapy.    Expected Outcomes Short Term: Achieves understanding of medications use. Understands that oxygen is a medication prescribed by physician. Demonstrates appropriate use of inhaler and oxygen therapy.;Long Term: Maintain appropriate use of medications, inhalers, and oxygen therapy.             Tobacco Use Initial Evaluation: Social History   Tobacco Use  Smoking Status Former   Packs/day: 1.00   Years: 30.00   Total pack years: 30.00   Types: Cigarettes   Quit date: 01/21/2012   Years since quitting: 10.4  Smokeless Tobacco Never    Exercise Goals and Review:  Exercise Goals     Row Name 07/10/22 1638             Exercise Goals   Increase Physical Activity Yes       Intervention Provide advice, education, support and counseling about physical activity/exercise needs.;Develop an individualized exercise prescription for aerobic and resistive training based on initial evaluation findings, risk stratification, comorbidities and participant's personal goals.       Expected Outcomes Short Term: Attend rehab on a regular basis to increase amount of physical activity.;Long Term: Add in home exercise to make exercise part of routine and to increase amount of physical activity.;Long Term: Exercising  regularly at least 3-5 days a week.       Increase Strength and Stamina Yes       Intervention Provide advice, education, support and counseling about physical activity/exercise needs.;Develop an individualized exercise prescription for aerobic and resistive training based on initial evaluation findings, risk stratification, comorbidities and participant's personal goals.       Expected Outcomes Short Term: Increase workloads from initial exercise prescription for resistance, speed, and METs.;Short Term: Perform resistance training exercises routinely during rehab and add in resistance training at home;Long Term: Improve cardiorespiratory fitness, muscular endurance and strength as  measured by increased METs and functional capacity ( )       Able to understand and use rate of perceived exertion (RPE) scale Yes       Intervention Provide education and explanation on how to use RPE scale       Expected Outcomes Short Term: Able to use RPE daily in rehab to express subjective intensity level;Long Term:  Able to use RPE to guide intensity level when exercising independently       Able to understand and use Dyspnea scale Yes       Intervention Provide education and explanation on how to use Dyspnea scale       Expected Outcomes Short Term: Able to use Dyspnea scale daily in rehab to express subjective sense of shortness of breath during exertion;Long Term: Able to use Dyspnea scale to guide intensity level when exercising independently       Knowledge and understanding of Target Heart Rate Range (THRR) Yes       Intervention Provide education and explanation of THRR including how the numbers were predicted and where they are located for reference       Expected Outcomes Short Term: Able to state/look up THRR;Short Term: Able to use daily as guideline for intensity in rehab;Long Term: Able to use THRR to govern intensity when exercising independently       Able to check pulse independently Yes        Intervention Provide education and demonstration on how to check pulse in carotid and radial arteries.;Review the importance of being able to check your own pulse for safety during independent exercise       Expected Outcomes Short Term: Able to explain why pulse checking is important during independent exercise;Long Term: Able to check pulse independently and accurately       Understanding of Exercise Prescription Yes       Intervention Provide education, explanation, and written materials on patient's individual exercise prescription       Expected Outcomes Short Term: Able to explain program exercise prescription;Long Term: Able to explain home exercise prescription to exercise independently                Copy of goals given to participant.

## 2022-07-10 NOTE — Progress Notes (Signed)
Pulmonary Individual Treatment Plan  Patient Details  Name: SURIA DEW MRN: [id number] Date of Birth: [birth year] Referring Provider:   Flowsheet Row Pulmonary Rehab from 07/10/2022 in [facility] Cardiac and Pulmonary Rehab  Referring Provider Aleskerov       Initial Encounter Date:  Flowsheet Row Pulmonary Rehab from 07/10/2022 in [facility] Cardiac and Pulmonary Rehab  Date 07/10/22       Visit Diagnosis: Stage 4 very severe COPD by GOLD classification ([place])  Chronic obstructive pulmonary disease, unspecified COPD type ([place])  Patient's Home Medications on Admission:  Current Outpatient Medications:    albuterol (PROVENTIL HFA;VENTOLIN HFA) 108 (90 Base) MCG/ACT inhaler, Inhale 1-2 puffs into the lungs every 4 (four) hours as needed for wheezing or shortness of breath., Disp: , Rfl:    albuterol (PROVENTIL) (2.5 MG/3ML) 0.083% nebulizer solution, Take 3 mLs (2.5 mg total) by nebulization every 6 (six) hours as needed for wheezing or shortness of breath., Disp: 360 mL, Rfl: 5   ALPRAZolam (XANAX) 0.25 MG tablet, Take 0.25 mg by mouth 2 (two) times daily., Disp: , Rfl:    azithromycin (ZITHROMAX) 250 MG tablet, Take by mouth., Disp: , Rfl:    DUPIXENT 300 MG/2ML SOPN, Inject into the skin., Disp: , Rfl:    EPINEPHrine 0.3 mg/0.3 mL IJ SOAJ injection, Inject into the muscle., Disp: , Rfl:    escitalopram (LEXAPRO) 20 MG tablet, Take 20 mg by mouth daily., Disp: , Rfl:    fluticasone (FLONASE) 50 MCG/ACT nasal spray, USE 2 SPRAYS IN EACH NOSTRIL ONCE DAILY, Disp: 16 g, Rfl: 5   guaiFENesin-codeine 100-10 MG/5ML syrup, Take 5 mLs by mouth every 6 (six) hours as needed for cough., Disp: 118 mL, Rfl: 0   Ipratropium-Albuterol (COMBIVENT RESPIMAT) 20-100 MCG/ACT AERS respimat, INHALE 1 PUFF BY MOUTH EVERY 6 HOURS AS NEEDED FOR WHEEZING, Disp: 4 g, Rfl: 5   ipratropium-albuterol (DUONEB) 0.5-2.5 (3) MG/3ML SOLN, Take 3 mLs by nebulization every 4 (four) hours as needed., Disp: 360 mL, Rfl: 2    mirtazapine (REMERON) 30 MG tablet, Take 30 mg by mouth at bedtime., Disp: , Rfl:    montelukast (SINGULAIR) 10 MG tablet, Take 10 mg by mouth at bedtime., Disp: , Rfl:    mupirocin ointment (BACTROBAN) 2 %, Apply 1 application topically daily. Apply to wound QD until healed. (Patient not taking: Reported on 06/28/2022), Disp: 22 g, Rfl: 0   predniSONE (DELTASONE) 5 MG tablet, Take 5 mg by mouth daily., Disp: , Rfl:    TRELEGY ELLIPTA 100-62.5-25 MCG/INH AEPB, INHALE 1 PUFF ONCE DAILY -  NEED  APPT  FOR  FURTHER  REFILLS, Disp: 60 each, Rfl: 1  Past Medical History: Past Medical History:  Diagnosis Date   Anxiety    COPD (chronic obstructive pulmonary disease) (HCC)    GERD (gastroesophageal reflux disease)    Hypotension    limiting med titration   NSTEMI (non-ST elevated myocardial infarction) ([place]) 2013   12/2011 with normal coronaries possibly secondary to Takotsubo (EF 30-35% by echo), occurred following two episodes of acute respiratory distress   Pneumonia 11/2016   history of   Takotsubo syndrome 4/13    Tobacco Use: Social History   Tobacco Use  Smoking Status Former   Packs/day: 1.00   Years: 30.00   Total pack years: 30.00   Types: Cigarettes   Quit date: 01/21/2012   Years since quitting: 10.4  Smokeless Tobacco Never    Labs: Review Flowsheet  More data exists  Latest Ref Rng & Units 02/01/2012 08/08/2012 04/04/2019 07/09/2020 11/05/2020  Labs for ITP Cardiac and Pulmonary Rehab  Hemoglobin A1c 4.8 - 5.6 % - - - - 5.8   PH, Arterial - 7.409  7.332  7.206  7.46  - -  PCO2 arterial mmHg 39.4  47.3  46.5  41  - -  Bicarbonate 20.0 - 28.0 mmol/L 24.4  25.1  18.4  29.2  31.6  31.9  -  TCO2 0 - 100 mmol/L 21.3  27  20   - - -  Acid-base deficit 0.0 - 2.0 mmol/L - 1.0  9.0  - - -  O2 Saturation % 94.1  99.0  100.0  97.5  92.4  94.0  -     Pulmonary Assessment Scores:  Pulmonary Assessment Scores     Row Name 07/10/22 1648         ADL UCSD   SOB Score total 87      Rest 1     Walk 4     Stairs 5     Bath 2     Dress 2     Shop 4       CAT Score   CAT Score 27       mMRC Score   mMRC Score 3              UCSD: Self-administered rating of dyspnea associated with activities of daily living (ADLs) 6-point scale (0 = "not at all" to 5 = "maximal or unable to do because of breathlessness")  Scoring Scores range from 0 to 120.  Minimally important difference is 5 units  CAT: CAT can identify the health impairment of COPD patients and is better correlated with disease progression.  CAT has a scoring range of zero to 40. The CAT score is classified into four groups of low (less than 10), medium (10 - 20), high (21-30) and very high (31-40) based on the impact level of disease on health status. A CAT score over 10 suggests significant symptoms.  A worsening CAT score could be explained by an exacerbation, poor medication adherence, poor inhaler technique, or progression of COPD or comorbid conditions.  CAT MCID is 2 points  mMRC: mMRC (Modified Medical Research Council) Dyspnea Scale is used to assess the degree of baseline functional disability in patients of respiratory disease due to dyspnea. No minimal important difference is established. A decrease in score of 1 point or greater is considered a positive change.   Pulmonary Function Assessment:  Pulmonary Function Assessment - 07/10/22 1643       Initial Spirometry Results   FVC% 56 %    FEV1% 26 %    FEV1/FVC Ratio 38      Post Bronchodilator Spirometry Results   FVC% 68 %    FEV1% 28 %    FEV1/FVC Ratio 34      Breath   Shortness of Breath Yes;Limiting activity             Exercise Target Goals: Exercise Program Goal: Individual exercise prescription set using results from initial 6 min walk test and THRR while considering  patient's activity barriers and safety.   Exercise Prescription Goal: Initial exercise prescription builds to 30-45 minutes a day of aerobic  activity, 2-3 days per week.  Home exercise guidelines will be given to patient during program as part of exercise prescription that the participant will acknowledge.  Education: Aerobic Exercise: - Group verbal and visual presentation on the components  of exercise prescription. Introduces F.I.T.T principle from ACSM for exercise prescriptions.  Reviews F.I.T.T. principles of aerobic exercise including progression. Written material given at graduation.   Education: Resistance Exercise: - Group verbal and visual presentation on the components of exercise prescription. Introduces F.I.T.T principle from ACSM for exercise prescriptions  Reviews F.I.T.T. principles of resistance exercise including progression. Written material given at graduation.    Education: Exercise & Equipment Safety: - Individual verbal instruction and demonstration of equipment use and safety with use of the equipment. Flowsheet Row Pulmonary Rehab from 07/10/2022 in [facility] Cardiac and Pulmonary Rehab  Date 07/10/22  Educator [facility]  Instruction Review Code 1- Verbalizes Understanding       Education: Exercise Physiology & General Exercise Guidelines: - Group verbal and written instruction with models to review the exercise physiology of the cardiovascular system and associated critical values. Provides general exercise guidelines with specific guidelines to those with heart or lung disease.  Flowsheet Row Pulmonary Rehab from 10/13/2020 in [facility] Cardiac and Pulmonary Rehab  Date 10/13/20  Educator [facility]  Instruction Review Code 1- Verbalizes Understanding       Education: Flexibility, Balance, Mind/Body Relaxation: - Group verbal and visual presentation with interactive activity on the components of exercise prescription. Introduces F.I.T.T principle from ACSM for exercise prescriptions. Reviews F.I.T.T. principles of flexibility and balance exercise training including progression. Also discusses the mind body connection.   Reviews various relaxation techniques to help reduce and manage stress (i.e. Deep breathing, progressive muscle relaxation, and visualization). Balance handout provided to take home. Written material given at graduation. Flowsheet Row Pulmonary Rehab from 05/29/2016 in [facility] Cardiac and Pulmonary Rehab  Date 03/08/16  Educator Salley Hews, PT  Instruction Review Code (retired) 2- meets goals/outcomes       Activity Barriers & Risk Stratification:  Activity Barriers & Cardiac Risk Stratification - 06/28/22 1414       Activity Barriers & Cardiac Risk Stratification   Activity Barriers Shortness of Breath             6 Minute Walk:  6 Minute Walk     Row Name 07/10/22 1624         6 Minute Walk   Phase Initial     Distance 640 feet     Walk Time 5.25 minutes     # of Rest Breaks 3     MPH 1.38     METS 2.72     RPE 13     Perceived Dyspnea  3     VO2 Peak 9.51     Symptoms Yes (comment)     Comments slight lightheadedness     Resting HR 92 bpm     Resting BP 142/80     Resting Oxygen Saturation  96 %     Exercise Oxygen Saturation  during 6 min walk 88 %     Max Ex. HR 117 bpm     Max Ex. BP 188/82     2 Minute Post BP 142/80       Interval HR   1 Minute HR 100     2 Minute HR 104     3 Minute HR 110     4 Minute HR 100     5 Minute HR 106     6 Minute HR 117     Interval Heart Rate? Yes       Interval Oxygen   Interval Oxygen? Yes     Baseline Oxygen Saturation % 96 %  1 Minute Oxygen Saturation % 93 %     1 Minute Liters of Oxygen 3 L     2 Minute Oxygen Saturation % 92 %     2 Minute Liters of Oxygen 3 L     3 Minute Oxygen Saturation % 91 %     3 Minute Liters of Oxygen 3 L     4 Minute Oxygen Saturation % 90 %     4 Minute Liters of Oxygen 3 L     5 Minute Oxygen Saturation % 88 %     5 Minute Liters of Oxygen 3 L     6 Minute Oxygen Saturation % 92 %     6 Minute Liters of Oxygen 3 L     2 Minute Post Oxygen Saturation % 94 %     2  Minute Post Liters of Oxygen 3 L             Oxygen Initial Assessment:  Oxygen Initial Assessment - 07/10/22 1643       Home Oxygen   Home Oxygen Device Home Concentrator;Portable Concentrator    Sleep Oxygen Prescription BiPAP;Continuous    Liters per minute 3    Home Exercise Oxygen Prescription Continuous    Liters per minute 3    Home Resting Oxygen Prescription Continuous    Liters per minute 3    Compliance with Home Oxygen Use Yes      Initial 6 min Walk   Oxygen Used Continuous    Liters per minute 3      Program Oxygen Prescription   Program Oxygen Prescription E-Tanks;Continuous    Liters per minute 3      Intervention   Short Term Goals To learn and exhibit compliance with exercise, home and travel O2 prescription;To learn and understand importance of monitoring SPO2 with pulse oximeter and demonstrate accurate use of the pulse oximeter.;To learn and understand importance of maintaining oxygen saturations>88%;To learn and demonstrate proper pursed lip breathing techniques or other breathing techniques. ;To learn and demonstrate proper use of respiratory medications    Long  Term Goals Exhibits compliance with exercise, home  and travel O2 prescription;Verbalizes importance of monitoring SPO2 with pulse oximeter and return demonstration;Maintenance of O2 saturations>88%;Exhibits proper breathing techniques, such as pursed lip breathing or other method taught during program session;Compliance with respiratory medication;Demonstrates proper use of MDI's             Oxygen Re-Evaluation:   Oxygen Discharge (Final Oxygen Re-Evaluation):   Initial Exercise Prescription:  Initial Exercise Prescription - 07/10/22 1600       Date of Initial Exercise RX and Referring Provider   Date 07/10/22    Referring Provider Aleskerov      Oxygen   Oxygen Continuous    Liters 3    Maintain Oxygen Saturation 88% or higher      Treadmill   MPH 1    Grade 0    Minutes  15    METs 1.8      Recumbant Bike   Level 1    RPM 50    Minutes 15    METs 2.72      NuStep   Level 1    SPM 80    Minutes 15    METs 2.72      REL-XR   Level 1    Speed 50    Minutes 15    METs 2.72      T5 Nustep  Level 1    SPM 60    Minutes 15    METs 2.72      Biostep-RELP   Level 1    SPM 50    Minutes 15    METs 2.72      Track   Laps 15    Minutes 15    METs 1.82      Prescription Details   Frequency (times per week) 3    Duration Progress to 30 minutes of continuous aerobic without signs/symptoms of physical distress      Intensity   THRR 40-80% of Max Heartrate 116-141    Ratings of Perceived Exertion 11-13    Perceived Dyspnea 0-4      Progression   Progression Continue to progress workloads to maintain intensity without signs/symptoms of physical distress.      Resistance Training   Training Prescription Yes    Weight 3    Reps 10-15             Perform Capillary Blood Glucose checks as needed.  Exercise Prescription Changes:   Exercise Prescription Changes     Row Name 07/10/22 1600             Response to Exercise   Blood Pressure (Admit) 142/80       Blood Pressure (Exercise) 188/82       Blood Pressure (Exit) 142/80       Heart Rate (Admit) 92 bpm       Heart Rate (Exercise) 117 bpm       Heart Rate (Exit) 107 bpm       Oxygen Saturation (Admit) 96 %       Oxygen Saturation (Exercise) 88 %       Oxygen Saturation (Exit) 94 %       Rating of Perceived Exertion (Exercise) 13       Perceived Dyspnea (Exercise) 3       Symptoms slight lightheadedness       Comments 6 MWT results                Exercise Comments:   Exercise Goals and Review:   Exercise Goals     Row Name 07/10/22 1638             Exercise Goals   Increase Physical Activity Yes       Intervention Provide advice, education, support and counseling about physical activity/exercise needs.;Develop an individualized exercise  prescription for aerobic and resistive training based on initial evaluation findings, risk stratification, comorbidities and participant's personal goals.       Expected Outcomes Short Term: Attend rehab on a regular basis to increase amount of physical activity.;Long Term: Add in home exercise to make exercise part of routine and to increase amount of physical activity.;Long Term: Exercising regularly at least 3-5 days a week.       Increase Strength and Stamina Yes       Intervention Provide advice, education, support and counseling about physical activity/exercise needs.;Develop an individualized exercise prescription for aerobic and resistive training based on initial evaluation findings, risk stratification, comorbidities and participant's personal goals.       Expected Outcomes Short Term: Increase workloads from initial exercise prescription for resistance, speed, and METs.;Short Term: Perform resistance training exercises routinely during rehab and add in resistance training at home;Long Term: Improve cardiorespiratory fitness, muscular endurance and strength as measured by increased METs and functional capacity (6MWT)       Able to  understand and use rate of perceived exertion (RPE) scale Yes       Intervention Provide education and explanation on how to use RPE scale       Expected Outcomes Short Term: Able to use RPE daily in rehab to express subjective intensity level;Long Term:  Able to use RPE to guide intensity level when exercising independently       Able to understand and use Dyspnea scale Yes       Intervention Provide education and explanation on how to use Dyspnea scale       Expected Outcomes Short Term: Able to use Dyspnea scale daily in rehab to express subjective sense of shortness of breath during exertion;Long Term: Able to use Dyspnea scale to guide intensity level when exercising independently       Knowledge and understanding of Target Heart Rate Range (THRR) Yes        Intervention Provide education and explanation of THRR including how the numbers were predicted and where they are located for reference       Expected Outcomes Short Term: Able to state/look up THRR;Short Term: Able to use daily as guideline for intensity in rehab;Long Term: Able to use THRR to govern intensity when exercising independently       Able to check pulse independently Yes       Intervention Provide education and demonstration on how to check pulse in carotid and radial arteries.;Review the importance of being able to check your own pulse for safety during independent exercise       Expected Outcomes Short Term: Able to explain why pulse checking is important during independent exercise;Long Term: Able to check pulse independently and accurately       Understanding of Exercise Prescription Yes       Intervention Provide education, explanation, and written materials on patient's individual exercise prescription       Expected Outcomes Short Term: Able to explain program exercise prescription;Long Term: Able to explain home exercise prescription to exercise independently                Exercise Goals Re-Evaluation :   Discharge Exercise Prescription (Final Exercise Prescription Changes):  Exercise Prescription Changes - 07/10/22 1600       Response to Exercise   Blood Pressure (Admit) 142/80    Blood Pressure (Exercise) 188/82    Blood Pressure (Exit) 142/80    Heart Rate (Admit) 92 bpm    Heart Rate (Exercise) 117 bpm    Heart Rate (Exit) 107 bpm    Oxygen Saturation (Admit) 96 %    Oxygen Saturation (Exercise) 88 %    Oxygen Saturation (Exit) 94 %    Rating of Perceived Exertion (Exercise) 13    Perceived Dyspnea (Exercise) 3    Symptoms slight lightheadedness    Comments 6 MWT results             Nutrition:  Target Goals: Understanding of nutrition guidelines, daily intake of sodium 1500mg , cholesterol 200mg , calories 30% from fat and 7% or less from  saturated fats, daily to have 5 or more servings of fruits and vegetables.  Education: All About Nutrition: -Group instruction provided by verbal, written material, interactive activities, discussions, models, and posters to present general guidelines for heart healthy nutrition including fat, fiber, MyPlate, the role of sodium in heart healthy nutrition, utilization of the nutrition label, and utilization of this knowledge for meal planning. Follow up email sent as well. Written material given at graduation. Flowsheet  Row Pulmonary Rehab from 10/13/2020 in [facility] Cardiac and Pulmonary Rehab  Date 09/08/20  Educator [facility]  Instruction Review Code 1- Verbalizes Understanding       Biometrics:  Pre Biometrics - 07/10/22 1639       Pre Biometrics   Height 5\' 5"  (1.651 m)    Weight 151 lb 4.8 oz (68.6 kg)    Waist Circumference 43.5 inches    Hip Circumference 37 inches    Waist to Hip Ratio 1.18 %    BMI (Calculated) 25.18    Single Leg Stand 8.67 seconds              Nutrition Therapy Plan and Nutrition Goals:  Nutrition Therapy & Goals - 07/10/22 1640       Intervention Plan   Intervention Prescribe, educate and counsel regarding individualized specific dietary modifications aiming towards targeted core components such as weight, hypertension, lipid management, diabetes, heart failure and other comorbidities.    Expected Outcomes Short Term Goal: Understand basic principles of dietary content, such as calories, fat, sodium, cholesterol and nutrients.;Short Term Goal: A plan has been developed with personal nutrition goals set during dietitian appointment.;Long Term Goal: Adherence to prescribed nutrition plan.             Nutrition Assessments:  MEDIFICTS Score Key: ?70 Need to make dietary changes  40-70 Heart Healthy Diet ? 40 Therapeutic Level Cholesterol Diet  Flowsheet Row Pulmonary Rehab from 07/10/2022 in [facility] Cardiac and Pulmonary Rehab  Picture Your Plate Total  Score on Admission 51      Picture Your Plate Scores: [id number] Unhealthy dietary pattern with much room for improvement. 41-50 Dietary pattern unlikely to meet recommendations for good health and room for improvement. 51-60 More healthful dietary pattern, with some room for improvement.  >60 Healthy dietary pattern, although there may be some specific behaviors that could be improved.   Nutrition Goals Re-Evaluation:   Nutrition Goals Discharge (Final Nutrition Goals Re-Evaluation):   Psychosocial: Target Goals: Acknowledge presence or absence of significant depression and/or stress, maximize coping skills, provide positive support system. Participant is able to verbalize types and ability to use techniques and skills needed for reducing stress and depression.   Education: Stress, Anxiety, and Depression - Group verbal and visual presentation to define topics covered.  Reviews how body is impacted by stress, anxiety, and depression.  Also discusses healthy ways to reduce stress and to treat/manage anxiety and depression.  Written material given at graduation. Flowsheet Row Pulmonary Rehab from 05/29/2016 in [facility] Cardiac and Pulmonary Rehab  Date 04/12/16  Educator Lucianne Lei, [facility]  Instruction Review Code (retired) 2- meets goals/outcomes       Education: Sleep Hygiene -Provides group verbal and written instruction about how sleep can affect your health.  Define sleep hygiene, discuss sleep cycles and impact of sleep habits. Review good sleep hygiene tips.    Initial Review & Psychosocial Screening:  Initial Psych Review & Screening - 06/28/22 1417       Initial Review   Current issues with Current Depression;Current Psychotropic Meds;Current Stress Concerns      Family Dynamics   Good Support System? Yes   partner Laverna Peace, daughter  10 min away     Barriers   Psychosocial barriers to participate in program There are no identifiable barriers or psychosocial needs.       Screening Interventions   Interventions Encouraged to exercise;To provide support and resources with identified psychosocial needs;Provide feedback about the scores to participant  Expected Outcomes Short Term goal: Utilizing psychosocial counselor, staff and physician to assist with identification of specific Stressors or current issues interfering with healing process. Setting desired goal for each stressor or current issue identified.;Long Term Goal: Stressors or current issues are controlled or eliminated.;Short Term goal: Identification and review with participant of any Quality of Life or Depression concerns found by scoring the questionnaire.;Long Term goal: The participant improves quality of Life and PHQ9 Scores as seen by post scores and/or verbalization of changes             Quality of Life Scores:  Scores of 19 and below usually indicate a poorer quality of life in these areas.  A difference of  2-3 points is a clinically meaningful difference.  A difference of 2-3 points in the total score of the Quality of Life Index has been associated with significant improvement in overall quality of life, self-image, physical symptoms, and general health in studies assessing change in quality of life.  PHQ-9: Review Flowsheet       07/10/2022 08/16/2020 05/26/2016 02/15/2016  Depression screen PHQ 2/9  Decreased Interest 1 1 0 0  Down, Depressed, Hopeless 1 1 1  0  PHQ - 2 Score 2 2 1  0  Altered sleeping 1 1 3 1   Tired, decreased energy 1 1 1 1   Change in appetite 0 0 0 1  Feeling bad or failure about yourself  0 0 0 0  Trouble concentrating 0 0 0 0  Moving slowly or fidgety/restless 0 0 0 0  Suicidal thoughts 0 0 0 0  PHQ-9 Score 4 4 5 3   Difficult doing work/chores Not difficult at all Somewhat difficult Somewhat difficult Not difficult at all   Interpretation of Total Score  Total Score Depression Severity:  1-4 = Minimal depression, 5-9 = Mild depression, 10-14 = Moderate  depression, 15-19 = Moderately severe depression, 20-27 = Severe depression   Psychosocial Evaluation and Intervention:  Psychosocial Evaluation - 06/28/22 1424       Psychosocial Evaluation & Interventions   Comments Noriyah has no barriers to attending the program. She lives with her partner,Jimmy. He ands her daughter are her support.  Roshawnda is taking Lexapro for mild depression. She is doing well with the med. She is in the process of a Lung transplant workup. So far, all is going well. Her goal this admission is to get to the exercise levels required by the transplant team to qualify for the transplant. This is a double lung transplant.  She is ready to attend the program. Work on the exercise requirements and build some strength and stamina, especially in her core.    Expected Outcomes [facility] Jacole attends all scheduled sessions, she works with the EP to progress her exercise to the level required by her [facility] team. LTG Anya mets the requirements for her double lung transplant and she continues her exercise progression after her transplant.    Continue Psychosocial Services  Follow up required by staff             Psychosocial Re-Evaluation:   Psychosocial Discharge (Final Psychosocial Re-Evaluation):   Education: Education Goals: Education classes will be provided on a weekly basis, covering required topics. Participant will state understanding/return demonstration of topics presented.  Learning Barriers/Preferences:   General Pulmonary Education Topics:  Infection Prevention: - Provides verbal and written material to individual with discussion of infection control including proper hand washing and proper equipment cleaning during exercise session. Flowsheet Row Pulmonary Rehab  from 07/10/2022 in [facility] Cardiac and Pulmonary Rehab  Date 07/10/22  Educator [facility]  Instruction Review Code 1- Verbalizes Understanding       Falls Prevention: - Provides verbal and  written material to individual with discussion of falls prevention and safety. Flowsheet Row Pulmonary Rehab from 07/10/2022 in [facility] Cardiac and Pulmonary Rehab  Date 07/10/22  Educator [facility]  Instruction Review Code 1- Verbalizes Understanding       Chronic Lung Disease Review: - Group verbal instruction with posters, models, PowerPoint presentations and videos,  to review new updates, new respiratory medications, new advancements in procedures and treatments. Providing information on websites and "800" numbers for continued self-education. Includes information about supplement oxygen, available portable oxygen systems, continuous and intermittent flow rates, oxygen safety, concentrators, and Medicare reimbursement for oxygen. Explanation of Pulmonary Drugs, including class, frequency, complications, importance of spacers, rinsing mouth after steroid MDI's, and proper cleaning methods for nebulizers. Review of basic lung anatomy and physiology related to function, structure, and complications of lung disease. Review of risk factors. Discussion about methods for diagnosing sleep apnea and types of masks and machines for OSA. Includes a review of the use of types of environmental controls: home humidity, furnaces, filters, dust mite/pet prevention, HEPA vacuums. Discussion about weather changes, air quality and the benefits of nasal washing. Instruction on Warning signs, infection symptoms, calling MD promptly, preventive modes, and value of vaccinations. Review of effective airway clearance, coughing and/or vibration techniques. Emphasizing that all should Create an Action Plan. Written material given at graduation. Flowsheet Row Pulmonary Rehab from 10/13/2020 in [facility] Cardiac and Pulmonary Rehab  Date 09/29/20  Educator [facility]  Instruction Review Code 1- Verbalizes Understanding       AED/CPR: - Group verbal and written instruction with the use of models to demonstrate the basic use of the AED with the  basic ABC's of resuscitation.    Anatomy and Cardiac Procedures: - Group verbal and visual presentation and models provide information about basic cardiac anatomy and function. Reviews the testing methods done to diagnose heart disease and the outcomes of the test results. Describes the treatment choices: Medical Management, Angioplasty, or Coronary Bypass Surgery for treating various heart conditions including Myocardial Infarction, Angina, Valve Disease, and Cardiac Arrhythmias.  Written material given at graduation.   Medication Safety: - Group verbal and visual instruction to review commonly prescribed medications for heart and lung disease. Reviews the medication, class of the drug, and side effects. Includes the steps to properly store meds and maintain the prescription regimen.  Written material given at graduation. Flowsheet Row Pulmonary Rehab from 10/13/2020 in [facility] Cardiac and Pulmonary Rehab  Date 09/15/20  Educator [facility]  Instruction Review Code 1- Verbalizes Understanding       Other: -Provides group and verbal instruction on various topics (see comments)   Knowledge Questionnaire Score:  Knowledge Questionnaire Score - 07/10/22 1642       Knowledge Questionnaire Score   Pre Score 18/18              Core Components/Risk Factors/Patient Goals at Admission:  Personal Goals and Risk Factors at Admission - 07/10/22 1640       Core Components/Risk Factors/Patient Goals on Admission    Weight Management Yes    Intervention Weight Management: Develop a combined nutrition and exercise program designed to reach desired caloric intake, while maintaining appropriate intake of nutrient and fiber, sodium and fats, and appropriate energy expenditure required for the weight goal.;Weight Management: Provide education and appropriate resources  to help participant work on and attain dietary goals.    Admit Weight 151 lb 4.8 oz (68.6 kg)    Goal Weight: Short Term 150 lb (68 kg)     Goal Weight: Long Term 150 lb (68 kg)    Expected Outcomes Short Term: Continue to assess and modify interventions until short term weight is achieved;Long Term: Adherence to nutrition and physical activity/exercise program aimed toward attainment of established weight goal;Weight Maintenance: Understanding of the daily nutrition guidelines, which includes 25-35% calories from fat, 7% or less cal from saturated fats, less than 200mg  cholesterol, less than 1.5gm of sodium, & 5 or more servings of fruits and vegetables daily;Understanding of distribution of calorie intake throughout the day with the consumption of 4-5 meals/snacks;Understanding recommendations for meals to include 15-35% energy as protein, 25-35% energy from fat, 35-60% energy from carbohydrates, less than 200mg  of dietary cholesterol, 20-35 gm of total fiber daily    Improve shortness of breath with ADL's Yes    Intervention Provide education, individualized exercise plan and daily activity instruction to help decrease symptoms of SOB with activities of daily living.    Expected Outcomes Short Term: Improve cardiorespiratory fitness to achieve a reduction of symptoms when performing ADLs;Long Term: Be able to perform more ADLs without symptoms or delay the onset of symptoms    Increase knowledge of respiratory medications and ability to use respiratory devices properly  Yes    Intervention Provide education and demonstration as needed of appropriate use of medications, inhalers, and oxygen therapy.    Expected Outcomes Short Term: Achieves understanding of medications use. Understands that oxygen is a medication prescribed by physician. Demonstrates appropriate use of inhaler and oxygen therapy.;Long Term: Maintain appropriate use of medications, inhalers, and oxygen therapy.             Education:Diabetes - Individual verbal and written instruction to review signs/symptoms of diabetes, desired ranges of glucose level fasting,  after meals and with exercise. Acknowledge that pre and post exercise glucose checks will be done for 3 sessions at entry of program.   Know Your Numbers and Heart Failure: - Group verbal and visual instruction to discuss disease risk factors for cardiac and pulmonary disease and treatment options.  Reviews associated critical values for Overweight/Obesity, Hypertension, Cholesterol, and Diabetes.  Discusses basics of heart failure: signs/symptoms and treatments.  Introduces Heart Failure Zone chart for action plan for heart failure.  Written material given at graduation.   Core Components/Risk Factors/Patient Goals Review:    Core Components/Risk Factors/Patient Goals at Discharge (Final Review):    ITP Comments:  ITP Comments     Row Name 06/28/22 1424 07/10/22 1623         ITP Comments Virtual orientation call completed today. shehas an appointment on Date: 07/10/2022  for EP eval and gym Orientation.  Documentation of diagnosis can be found in [facility] Date: 05/03/2022 . Completed 6MWT and gym orientation. Initial ITP created and sent for review to Dr. Zetta Bills, Medical Director.               Comments: initial ITP

## 2022-07-12 ENCOUNTER — Encounter: Payer: Medicare Other | Admitting: *Deleted

## 2022-07-12 DIAGNOSIS — J449 Chronic obstructive pulmonary disease, unspecified: Secondary | ICD-10-CM

## 2022-07-12 NOTE — Progress Notes (Signed)
Daily Session Note  Patient Details  Name: Heidi [name] MRN: [id number] Date of Birth: 02/08/1956 Referring Provider:   Flowsheet Row Pulmonary Rehab from 07/10/2022 in [facility] Cardiac and Pulmonary Rehab  Referring Provider [name]       Encounter Date: 07/12/2022  Check In:  Session Check In - 07/12/22 1404       Check-In   Supervising physician immediately available to respond to emergencies See telemetry face sheet for immediately available ER MD    Location [facility]-Cardiac & Pulmonary Rehab    Staff Present Renita Papa, RN BSN;Joseph Tessie Fass, RCP,RRT,BSRT;Noah [place], [place], Exercise Physiologist    Virtual Visit No    Medication changes reported     No    Fall or balance concerns reported    No    Warm-up and Cool-down Performed on first and last piece of equipment    Resistance Training Performed Yes    VAD Patient? No    PAD/SET Patient? No      Pain Assessment   Currently in Pain? No/denies                Social History   Tobacco Use  Smoking Status Former   Packs/day: 1.00   Years: 30.00   Total pack years: 30.00   Types: Cigarettes   Quit date: 01/21/2012   Years since quitting: 10.4  Smokeless Tobacco Never    Goals Met:  Independence with exercise equipment Exercise tolerated well No report of concerns or symptoms today Strength training completed today  Goals Unmet:  Not Applicable  Comments: First full day of exercise!  Patient was oriented to gym and equipment including functions, settings, policies, and procedures.  Patient's individual exercise prescription and treatment plan were reviewed.  All starting workloads were established based on the results of the 6 minute walk test done at initial orientation visit.  The plan for exercise progression was also introduced and progression will be customized based on patient's performance and goals.     Dr. Emily Filbert is Medical Director for [place].  Dr. Ottie Glazier is Medical Director for [facility] Pulmonary Rehabilitation.

## 2022-07-13 ENCOUNTER — Encounter: Payer: Medicare Other | Admitting: *Deleted

## 2022-07-13 DIAGNOSIS — J449 Chronic obstructive pulmonary disease, unspecified: Secondary | ICD-10-CM

## 2022-07-13 NOTE — Progress Notes (Signed)
Daily Session Note  Patient Details  Name: Heidi [name] MRN: [id number] Date of Birth: 08/28/1956 Referring Provider:   Flowsheet Row Pulmonary Rehab from 07/10/2022 in [facility] Cardiac and Pulmonary Rehab  Referring Provider Aleskerov       Encounter Date: 07/13/2022  Check In:  Session Check In - 07/13/22 1341       Check-In   Supervising physician immediately available to respond to emergencies See telemetry face sheet for immediately available ER MD    Location [facility]-Cardiac & Pulmonary Rehab    Staff Present Meredith Craven, RN BSN;Joseph Hood, RCP,RRT,BSRT;Noah Tickle, BS, Exercise Physiologist    Virtual Visit No    Medication changes reported     No    Fall or balance concerns reported    No    Warm-up and Cool-down Performed on first and last piece of equipment    Resistance Training Performed Yes    VAD Patient? No    PAD/SET Patient? No      Pain Assessment   Currently in Pain? No/denies                Social History   Tobacco Use  Smoking Status Former   Packs/day: 1.00   Years: 30.00   Total pack years: 30.00   Types: Cigarettes   Quit date: 01/21/2012   Years since quitting: 10.4  Smokeless Tobacco Never    Goals Met:  Independence with exercise equipment Exercise tolerated well No report of concerns or symptoms today Strength training completed today  Goals Unmet:  Not Applicable  Comments: Pt able to follow exercise prescription today without complaint.  Will continue to monitor for progression.    Dr. Mark Miller is Medical Director for [facility].  Dr. Fuad Aleskerov is Medical Director for [facility] Pulmonary Rehabilitation. 

## 2022-07-17 ENCOUNTER — Encounter: Payer: Medicare Other | Admitting: *Deleted

## 2022-07-17 DIAGNOSIS — J449 Chronic obstructive pulmonary disease, unspecified: Secondary | ICD-10-CM | POA: Diagnosis not present

## 2022-07-17 NOTE — Progress Notes (Signed)
Daily Session Note  Patient Details  Name: Heidi [name] MRN: [id number] Date of Birth: 01-Jul-1956 Referring Provider:   Flowsheet Row Pulmonary Rehab from 07/10/2022 in [facility] Cardiac and Pulmonary Rehab  Referring Provider [name]       Encounter Date: 07/17/2022  Check In:  Session Check In - 07/17/22 1353       Check-In   Supervising physician immediately available to respond to emergencies See telemetry face sheet for immediately available ER MD    Location [facility]-Cardiac & Pulmonary Rehab    Staff Present Renita Papa, RN BSN;Joseph Lavon, RCP,RRT,BSRT;Kelly [place], [place], ACSM CEP, Exercise Physiologist    Virtual Visit No    Medication changes reported     No    Fall or balance concerns reported    No    Warm-up and Cool-down Performed on first and last piece of equipment    Resistance Training Performed Yes    VAD Patient? No    PAD/SET Patient? No      Pain Assessment   Currently in Pain? No/denies                Social History   Tobacco Use  Smoking Status Former   Packs/day: 1.00   Years: 30.00   Total pack years: 30.00   Types: Cigarettes   Quit date: 01/21/2012   Years since quitting: 10.4  Smokeless Tobacco Never    Goals Met:  Independence with exercise equipment Exercise tolerated well No report of concerns or symptoms today Strength training completed today  Goals Unmet:  Not Applicable  Comments: Pt able to follow exercise prescription today without complaint.  Will continue to monitor for progression.    Dr. Emily Filbert is Medical Director for [place].  Dr. Ottie Glazier is Medical Director for [facility] Pulmonary Rehabilitation.

## 2022-07-17 NOTE — Progress Notes (Signed)
Heidi [name], Heidi T. ([id number]) Visit Report for 04/12/2022 Arrival Information Details Patient Name: Heidi [name], Heidi T. Date of Service: 04/12/2022 2:00 PM Medical Record Number: [id number] Patient Account Number: [id number] Date of Birth/Sex: 1956-03-21 (66 y.o. F) Treating RN: Yevonne PaxEpps, Carrie Primary Care Kirsten Spearing: Reino KentMcLaughin, Miriam Other Clinician: Referring Chessa Barrasso: Reino KentMcLaughin, Miriam Treating Adalynne Steffensmeier/Extender: Tilda FrancoHoffman, Jessica Weeks in Treatment: 5 Visit Information History Since Last Visit All ordered tests and consults were completed: No Patient Arrived: Ambulatory Added or deleted any medications: No Arrival Time: 14:21 Any new allergies or adverse reactions: No Accompanied By: self Had a fall or experienced change in No Transfer Assistance: None activities of daily living that may affect Patient Identification Verified: Yes risk of falls: Secondary Verification Process Completed: Yes Signs or symptoms of abuse/neglect since last visito No Patient Requires Transmission-Based No Hospitalized since last visit: No Precautions: Implantable device outside of the clinic excluding No Patient Has Alerts: Yes cellular tissue based products placed in the center Patient Alerts: ABI 04/07/22 L 1.14 R since last visit: 1.09 Has Dressing in Place as Prescribed: Yes TBI 04/07/22 L 0.81 R Pain Present Now: No 0.77 Electronic Signature(s) Signed: 07/17/2022 2:33:39 PM By: Yevonne PaxEpps, Carrie RN Entered By: Yevonne PaxEpps, Carrie on 04/12/2022 14:24:50 Rask, Ellwood DenseYNTHIA T. ([id number]) -------------------------------------------------------------------------------- Clinic Level of Care Assessment Details Patient Name: Heidi [name], [name] Date of Service: 04/12/2022 2:00 PM Medical Record Number: [id number] Patient Account Number: [id number] Date of Birth/Sex: 1956-03-21 (66 y.o. F) Treating RN: Yevonne PaxEpps, Carrie Primary Care Harvie Morua: Reino KentMcLaughin, Miriam Other Clinician: Referring Takuya Lariccia: Reino KentMcLaughin,  Miriam Treating Buddie Marston/Extender: Tilda FrancoHoffman, Jessica Weeks in Treatment: 5 Clinic Level of Care Assessment Items TOOL 4 Quantity Score X - Use when only an EandM is performed on FOLLOW-UP visit 1 0 ASSESSMENTS - Nursing Assessment / Reassessment X - Reassessment of Co-morbidities (includes updates in patient status) 1 10 X- 1 5 Reassessment of Adherence to Treatment Plan ASSESSMENTS - Wound and Skin Assessment / Reassessment X - Simple Wound Assessment / Reassessment - one wound 1 5 []  - 0 Complex Wound Assessment / Reassessment - multiple wounds []  - 0 Dermatologic / Skin Assessment (not related to wound area) ASSESSMENTS - Focused Assessment []  - Circumferential Edema Measurements - multi extremities 0 []  - 0 Nutritional Assessment / Counseling / Intervention []  - 0 Lower Extremity Assessment (monofilament, tuning fork, pulses) []  - 0 Peripheral Arterial Disease Assessment (using hand held doppler) ASSESSMENTS - Ostomy and/or Continence Assessment and Care []  - Incontinence Assessment and Management 0 []  - 0 Ostomy Care Assessment and Management (repouching, etc.) PROCESS - Coordination of Care X - Simple Patient / Family Education for ongoing care 1 15 []  - 0 Complex (extensive) Patient / Family Education for ongoing care X- 1 10 Staff obtains ChiropractorConsents, Records, Test Results / Process Orders []  - 0 Staff telephones HHA, Nursing Homes / Clarify orders / etc []  - 0 Routine Transfer to another Facility (non-emergent condition) []  - 0 Routine Hospital Admission (non-emergent condition) []  - 0 New Admissions / Manufacturing engineernsurance Authorizations / Ordering NPWT, Apligraf, etc. []  - 0 Emergency Hospital Admission (emergent condition) X- 1 10 Simple Discharge Coordination []  - 0 Complex (extensive) Discharge Coordination PROCESS - Special Needs []  - Pediatric / Minor Patient Management 0 []  - 0 Isolation Patient Management []  - 0 Hearing / Language / Visual special needs []  -  0 Assessment of Community assistance (transportation, D/C planning, etc.) []  - 0 Additional assistance / Altered mentation []  - 0 Support Surface(s) Assessment (bed, cushion, seat, etc.) INTERVENTIONS - Wound  Cleansing / Measurement Hussar, [name] ([id number]) X- 1 5 Simple Wound Cleansing - one wound []  - 0 Complex Wound Cleansing - multiple wounds X- 1 5 Wound Imaging (photographs - any number of wounds) []  - 0 Wound Tracing (instead of photographs) X- 1 5 Simple Wound Measurement - one wound []  - 0 Complex Wound Measurement - multiple wounds INTERVENTIONS - Wound Dressings X - Small Wound Dressing one or multiple wounds 1 10 []  - 0 Medium Wound Dressing one or multiple wounds []  - 0 Large Wound Dressing one or multiple wounds []  - 0 Application of Medications - topical []  - 0 Application of Medications - injection INTERVENTIONS - Miscellaneous []  - External ear exam 0 []  - 0 Specimen Collection (cultures, biopsies, blood, body fluids, etc.) []  - 0 Specimen(s) / Culture(s) sent or taken to Lab for analysis []  - 0 Patient Transfer (multiple staff / / Similar devices) []  - 0 Simple Staple / Suture removal (25 or less) []  - 0 Complex Staple / Suture removal (26 or more) []  - 0 Hypo / Hyperglycemic Management (close monitor of Blood Glucose) []  - 0 Ankle / Brachial Index (ABI) - do not check if billed separately X- 1 5 Vital Signs Has the patient been seen at the hospital within the last three years: Yes Total Score: 85 Level Of Care: New/Established - Level 3 Electronic Signature(s) Signed: 07/17/2022 2:33:39 PM By: RN Entered By: on 04/12/2022 14:51:53 Heidecker, ( ) -------------------------------------------------------------------------------- Encounter Discharge Information Details Patient Name: T. Date of Service: 04/12/2022 2:00 PM Medical Record Number: Patient Account  Number: Date of Birth/Sex: July 30, 1956 (66 y.o. F) Treating RN: Primary Care Nneoma Harral: Other Clinician: Referring Tianah Lonardo: Treating Toryn Dewalt/Extender: 07/19/2022 in Treatment: 5 Encounter Discharge Information Items Discharge Condition: Stable Ambulatory Status: Ambulatory Discharge Destination: Home Transportation: Private Auto Accompanied By: self Schedule Follow-up Appointment: Yes Clinical Summary of Care: Electronic Signature(s) Signed: 07/17/2022 2:33:39 PM By: Yevonne Pax RN Entered By: 04/14/2022 on 04/12/2022 14:52:37 Peto, [id number] (Heidi Links) -------------------------------------------------------------------------------- Lower Extremity Assessment Details Patient Name: 04/14/2022 T. Date of Service: 04/12/2022 2:00 PM Medical Record Number: [id number] Patient Account Number: 04/26/1956 Date of Birth/Sex: February 22, 1956 (66 y.o. F) Treating RN: Reino Kent Primary Care Teigan Sahli: Reino Kent Other Clinician: Referring Barbarajean Kinzler: Tilda Franco Treating Delores Edelstein/Extender: 07/19/2022 in Treatment: 5 Edema Assessment Assessed: [Left: No] [Right: No] Edema: [Left: N] [Right: o] Calf Left: Right: Point of Measurement: 35 cm From Medial Instep 30 cm Ankle Left: Right: Point of Measurement: 11 cm From Medial Instep 18 cm Knee To Floor Left: Right: From Medial Instep 46 cm Vascular Assessment Pulses: Dorsalis Pedis Palpable: [Right:Yes] Electronic Signature(s) Signed: 07/17/2022 2:33:39 PM By: Yevonne Pax RN Entered By: 04/14/2022 on 04/12/2022 14:29:29 Bickley, Mariyam [id number] (Heidi Links) -------------------------------------------------------------------------------- Multi Wound Chart Details Patient Name: 04/14/2022 T. Date of Service: 04/12/2022 2:00 PM Medical Record Number: [id number] Patient Account Number: 04/26/1956 Date of Birth/Sex: 11-03-1955 (66 y.o.  F) Treating RN: Reino Kent Primary Care Earla Charlie: Reino Kent Other Clinician: Referring Keidra Withers: Tilda Franco Treating Ambyr Qadri/Extender: 07/19/2022 in Treatment: 5 Vital Signs Height(in): Pulse(bpm): 103 Weight(lbs): 150 Blood Pressure(mmHg): 174/83 Body Mass Index(BMI): Temperature(F): 98.1 Respiratory Rate(breaths/min): 18 Photos: [N/A:N/A] Wound Location: Right, Lateral Ankle N/A N/A Wounding Event: Gradually Appeared N/A N/A Primary Etiology: Venous Leg Ulcer N/A N/A Comorbid History: Chronic Obstructive Pulmonary N/A N/A Disease (COPD), Hypotension Date Acquired: 11/30/2021 N/A  N/A Weeks of Treatment: 5 N/A N/A Wound Status: Open N/A N/A Wound Recurrence: No N/A N/A Measurements L x W x D (cm) 0.2x0.2x0.2 N/A N/A Area (cm) : 0.031 N/A N/A Volume (cm) : 0.006 N/A N/A % Reduction in Area: 0.00% N/A N/A % Reduction in Volume: 0.00% N/A N/A Classification: Full Thickness Without Exposed N/A N/A Support Structures Exudate Amount: Medium N/A N/A Exudate Type: Serosanguineous N/A N/A Exudate Color: red, brown N/A N/A Granulation Amount: None Present (0%) N/A N/A Necrotic Amount: Large (67-100%) N/A N/A Epithelialization: None N/A N/A Treatment Notes Electronic Signature(s) Signed: 07/17/2022 2:33:39 PM By: Yevonne Pax RN Entered By: Yevonne Pax on 04/12/2022 14:29:39 Moctezuma, Ellwood Dense ([id number]) -------------------------------------------------------------------------------- Multi-Disciplinary Care Plan Details Patient Name: Heidi Links T. Date of Service: 04/12/2022 2:00 PM Medical Record Number: [id number] Patient Account Number: [id number] Date of Birth/Sex: October 30, 1956 (66 y.o. F) Treating RN: Yevonne Pax Primary Care Jaimya Feliciano: Reino Kent Other Clinician: Referring Mitzy Naron: Reino Kent Treating Margarita Bobrowski/Extender: Tilda Franco in Treatment: 5 Active Inactive Orientation to the Wound Care Program Nursing  Diagnoses: Knowledge deficit related to the wound healing center program Goals: Patient/caregiver will verbalize understanding of the Wound Healing Center Program Date Initiated: 03/08/2022 Target Resolution Date: 03/15/2022 Goal Status: Active Interventions: Provide education on orientation to the wound center Notes: Wound/Skin Impairment Nursing Diagnoses: Impaired tissue integrity Knowledge deficit related to ulceration/compromised skin integrity Goals: Ulcer/skin breakdown will have a volume reduction of 30% by week 4 Date Initiated: 03/08/2022 Target Resolution Date: 04/05/2022 Goal Status: Active Ulcer/skin breakdown will have a volume reduction of 50% by week 8 Date Initiated: 03/08/2022 Target Resolution Date: 05/03/2022 Goal Status: Active Ulcer/skin breakdown will have a volume reduction of 80% by week 12 Date Initiated: 03/08/2022 Target Resolution Date: 05/31/2022 Goal Status: Active Ulcer/skin breakdown will heal within 14 weeks Date Initiated: 03/08/2022 Target Resolution Date: 06/14/2022 Goal Status: Active Interventions: Assess patient/caregiver ability to obtain necessary supplies Assess patient/caregiver ability to perform ulcer/skin care regimen upon admission and as needed Assess ulceration(s) every visit Provide education on ulcer and skin care Notes: Electronic Signature(s) Signed: 07/17/2022 2:33:39 PM By: Yevonne Pax RN Entered By: Yevonne Pax on 04/12/2022 14:29:33 Heinbaugh, Ellwood Dense ([id number]) -------------------------------------------------------------------------------- Pain Assessment Details Patient Name: Heidi Links T. Date of Service: 04/12/2022 2:00 PM Medical Record Number: [id number] Patient Account Number: [id number] Date of Birth/Sex: [birth year] (66 y.o. F) Treating RN: Yevonne Pax Primary Care Alvah Lagrow: Reino Kent Other Clinician: Referring Sande Pickert: Reino Kent Treating Odessie Polzin/Extender: Tilda Franco in  Treatment: 5 Active Problems Location of Pain Severity and Description of Pain Patient Has Paino No Site Locations Pain Management and Medication Current Pain Management: Electronic Signature(s) Signed: 07/17/2022 2:33:39 PM By: Yevonne Pax RN Entered By: Yevonne Pax on 04/12/2022 14:25:56 Heffler, [place]wood Dense ([id number]) -------------------------------------------------------------------------------- Patient/Caregiver Education Details Patient Name: Heidi Links T. Date of Service: 04/12/2022 2:00 PM Medical Record Number: [id number] Patient Account Number: [id number] Date of Birth/Gender: 06-Apr-1956 (66 y.o. F) Treating RN: Yevonne Pax Primary Care Physician: Reino Kent Other Clinician: Referring Physician: Reino Kent Treating Physician/Extender: Tilda Franco in Treatment: 5 Education Assessment Education Provided To: Patient Education Topics Provided Wound/Skin Impairment: Methods: Explain/Verbal Responses: State content correctly Electronic Signature(s) Signed: 07/17/2022 2:33:39 PM By: Yevonne Pax RN Entered By: Yevonne Pax on 04/12/2022 14:52:10 Swingle, Ellwood Dense ([id number]) -------------------------------------------------------------------------------- Wound Assessment Details Patient Name: Heidi Links T. Date of Service: 04/12/2022 2:00 PM Medical Record Number: [id number] Patient Account Number: [id number] Date of Birth/Sex: 10/02/1956 (66 y.o. F) Treating RN: Yevonne Pax Primary Care  Maxon Kresse: Harold Barban Other Clinician: Referring Zalayah Pizzuto: Harold Barban Treating Brevan Luberto/Extender: Yaakov Guthrie in Treatment: 5 Wound Status Wound Number: 1 Primary Venous Leg Ulcer Etiology: Wound Location: Right, Lateral Ankle Wound Status: Open Wounding Event: Gradually Appeared Comorbid Chronic Obstructive Pulmonary Disease (COPD), Date Acquired: 11/30/2021 History: Hypotension Weeks Of Treatment: 5 Clustered Wound:  No Photos Wound Measurements Length: (cm) 0.2 Width: (cm) 0.2 Depth: (cm) 0.2 Area: (cm) 0.031 Volume: (cm) 0.006 % Reduction in Area: 0% % Reduction in Volume: 0% Epithelialization: None Tunneling: No Undermining: No Wound Description Classification: Full Thickness Without Exposed Support Structu Exudate Amount: Medium Exudate Type: Serosanguineous Exudate Color: red, brown res Foul Odor After Cleansing: No Slough/Fibrino Yes Wound Bed Granulation Amount: None Present (0%) Necrotic Amount: Large (67-100%) Necrotic Quality: Adherent Therapist, music) Signed: 07/17/2022 2:33:39 PM By: Carlene Coria RN Entered By: Carlene Coria on 04/12/2022 14:28:51 Uber, [name] ([id number]) -------------------------------------------------------------------------------- Vitals Details Patient Name: Wendall Papa T. Date of Service: 04/12/2022 2:00 PM Medical Record Number: [id number] Patient Account Number: [id number] Date of Birth/Sex: Jan 24, 1956 (66 y.o. F) Treating RN: Carlene Coria Primary Care Leontae Bostock: Harold Barban Other Clinician: Referring Nakea Gouger: Harold Barban Treating Shakea Isip/Extender: Yaakov Guthrie in Treatment: 5 Vital Signs Time Taken: 14:25 Temperature (F): 98.1 Weight (lbs): 150 Pulse (bpm): 103 Respiratory Rate (breaths/min): 18 Blood Pressure (mmHg): 174/83 Reference Range: 80 - 120 mg / dl Electronic Signature(s) Signed: 07/17/2022 2:33:39 PM By: Carlene Coria RN Entered By: Carlene Coria on 04/12/2022 14:25:48

## 2022-07-19 ENCOUNTER — Encounter: Payer: Medicare Other | Admitting: *Deleted

## 2022-07-19 DIAGNOSIS — J449 Chronic obstructive pulmonary disease, unspecified: Secondary | ICD-10-CM | POA: Diagnosis not present

## 2022-07-19 NOTE — Progress Notes (Signed)
Daily Session Note  Patient Details  Name: Heidi [name] MRN: [id number] Date of Birth: Nov 13, 1955 Referring Provider:   Flowsheet Row Pulmonary Rehab from 07/10/2022 in [facility] Cardiac and Pulmonary Rehab  Referring Provider [name]       Encounter Date: 07/19/2022  Check In:  Session Check In - 07/19/22 1347       Check-In   Supervising physician immediately available to respond to emergencies See telemetry face sheet for immediately available ER MD    Location [facility]-Cardiac & Pulmonary Rehab    Staff Present Renita Papa, RN BSN;Laureen Owens Shark, BS, RRT, CPFT;Joseph Sells, [place]    Virtual Visit No    Medication changes reported     No    Fall or balance concerns reported    No    Warm-up and Cool-down Performed on first and last piece of equipment    Resistance Training Performed Yes    VAD Patient? No    PAD/SET Patient? No      Pain Assessment   Currently in Pain? No/denies                Social History   Tobacco Use  Smoking Status Former   Packs/day: 1.00   Years: 30.00   Total pack years: 30.00   Types: Cigarettes   Quit date: 01/21/2012   Years since quitting: 10.4  Smokeless Tobacco Never    Goals Met:  Independence with exercise equipment Exercise tolerated well No report of concerns or symptoms today Strength training completed today  Goals Unmet:  Not Applicable  Comments: Pt able to follow exercise prescription today without complaint.  Will continue to monitor for progression.    Dr. Emily Filbert is Medical Director for [place].  Dr. Ottie Glazier is Medical Director for [facility] Pulmonary Rehabilitation.

## 2022-07-20 ENCOUNTER — Encounter: Payer: Medicare Other | Admitting: *Deleted

## 2022-07-20 DIAGNOSIS — J449 Chronic obstructive pulmonary disease, unspecified: Secondary | ICD-10-CM

## 2022-07-20 NOTE — Progress Notes (Signed)
Daily Session Note  Patient Details  Name: Heidi [name] MRN: [id number] Date of Birth: 1956/09/12 Referring Provider:   Flowsheet Row Pulmonary Rehab from 07/10/2022 in [facility] Cardiac and Pulmonary Rehab  Referring Provider [name]       Encounter Date: 07/20/2022  Check In:  Session Check In - 07/20/22 1407       Check-In   Supervising physician immediately available to respond to emergencies See telemetry face sheet for immediately available ER MD    Location [facility]-Cardiac & Pulmonary Rehab    Staff Present Renita Papa, RN BSN;Jessica Luan Pulling, MA, RCEP, CCRP, CCET;Joseph [place], [place]    Virtual Visit No    Medication changes reported     No    Fall or balance concerns reported    No    Warm-up and Cool-down Performed on first and last piece of equipment    Resistance Training Performed Yes    VAD Patient? No    PAD/SET Patient? No      Pain Assessment   Currently in Pain? No/denies                Social History   Tobacco Use  Smoking Status Former   Packs/day: 1.00   Years: 30.00   Total pack years: 30.00   Types: Cigarettes   Quit date: 01/21/2012   Years since quitting: 10.5  Smokeless Tobacco Never    Goals Met:  Independence with exercise equipment Exercise tolerated well No report of concerns or symptoms today Strength training completed today  Goals Unmet:  Not Applicable  Comments: Pt able to follow exercise prescription today without complaint.  Will continue to monitor for progression.    Dr. Emily Filbert is Medical Director for [place].  Dr. Ottie Glazier is Medical Director for [facility] Pulmonary Rehabilitation.

## 2022-07-24 ENCOUNTER — Encounter: Payer: Medicare Other | Admitting: *Deleted

## 2022-07-24 DIAGNOSIS — J449 Chronic obstructive pulmonary disease, unspecified: Secondary | ICD-10-CM

## 2022-07-24 NOTE — Progress Notes (Signed)
Daily Session Note  Patient Details  Name: Heidi [name] MRN: [id number] Date of Birth: Nov 25, 1955 Referring Provider:   Flowsheet Row Pulmonary Rehab from 07/10/2022 in [facility] Cardiac and Pulmonary Rehab  Referring Provider [name]       Encounter Date: 07/24/2022  Check In:  Session Check In - 07/24/22 1341       Check-In   Supervising physician immediately available to respond to emergencies See telemetry face sheet for immediately available ER MD    Location [facility]-Cardiac & Pulmonary Rehab    Staff Present Renita Papa, RN BSN;Joseph [place], RCP,RRT,BSRT;Kara [place], [place], ASCM CEP, Exercise Physiologist    Virtual Visit No    Medication changes reported     No    Fall or balance concerns reported    No    Warm-up and Cool-down Performed on first and last piece of equipment    Resistance Training Performed Yes    VAD Patient? No    PAD/SET Patient? No      Pain Assessment   Currently in Pain? No/denies                Social History   Tobacco Use  Smoking Status Former   Packs/day: 1.00   Years: 30.00   Total pack years: 30.00   Types: Cigarettes   Quit date: 01/21/2012   Years since quitting: 10.5  Smokeless Tobacco Never    Goals Met:  Independence with exercise equipment Exercise tolerated well No report of concerns or symptoms today Strength training completed today  Goals Unmet:  Not Applicable  Comments: Pt able to follow exercise prescription today without complaint.  Will continue to monitor for progression.    Dr. Emily Filbert is Medical Director for [place].  Dr. Ottie Glazier is Medical Director for [facility] Pulmonary Rehabilitation.

## 2022-07-26 ENCOUNTER — Encounter: Payer: Medicare Other | Admitting: *Deleted

## 2022-07-26 DIAGNOSIS — J449 Chronic obstructive pulmonary disease, unspecified: Secondary | ICD-10-CM | POA: Diagnosis not present

## 2022-07-26 NOTE — Progress Notes (Signed)
Daily Session Note  Patient Details  Name: Heidi [name] MRN: [id number] Date of Birth: [birth year] Referring Provider:   Flowsheet Row Pulmonary Rehab from 07/10/2022 in [facility] Cardiac and Pulmonary Rehab  Referring Provider [name]       Encounter Date: 07/26/2022  Check In:  Session Check In - 07/26/22 1406       Check-In   Supervising physician immediately available to respond to emergencies See telemetry face sheet for immediately available ER MD    Location [facility]-Cardiac & Pulmonary Rehab    Staff Present Darlyne Russian, RN, ADN;Joseph Tessie Fass, Sharren [place], MS, ASCM CEP, Exercise Physiologist    Virtual Visit No    Medication changes reported     No    Fall or balance concerns reported    No    Warm-up and Cool-down Performed on first and last piece of equipment    Resistance Training Performed Yes    VAD Patient? No    PAD/SET Patient? No      Pain Assessment   Currently in Pain? No/denies                Social History   Tobacco Use  Smoking Status Former   Packs/day: 1.00   Years: 30.00   Total pack years: 30.00   Types: Cigarettes   Quit date: 01/21/2012   Years since quitting: 10.5  Smokeless Tobacco Never    Goals Met:  Independence with exercise equipment Exercise tolerated well No report of concerns or symptoms today Strength training completed today  Goals Unmet:  Not Applicable  Comments: Pt able to follow exercise prescription today without complaint.  Will continue to monitor for progression.    Dr. Emily Filbert is Medical Director for [place].  Dr. Ottie Glazier is Medical Director for [facility] Pulmonary Rehabilitation.

## 2022-07-27 ENCOUNTER — Encounter: Payer: Medicare Other | Admitting: *Deleted

## 2022-07-27 DIAGNOSIS — J449 Chronic obstructive pulmonary disease, unspecified: Secondary | ICD-10-CM | POA: Diagnosis not present

## 2022-07-27 NOTE — Progress Notes (Signed)
Daily Session Note  Patient Details  Name: Heidi [name] MRN: [id number] Date of Birth: 1956-05-10 Referring Provider:   Flowsheet Row Pulmonary Rehab from 07/10/2022 in [facility] Cardiac and Pulmonary Rehab  Referring Provider [name]       Encounter Date: 07/27/2022  Check In:  Session Check In - 07/27/22 4707       Check-In   Supervising physician immediately available to respond to emergencies See telemetry face sheet for immediately available ER MD    Location [facility]-Cardiac & Pulmonary Rehab    Staff Present Justin Mend, Lorre Nick, MA, RCEP, CCRP, CCET;Dezyre Hoefer Sherryll Burger, RN BSN    Virtual Visit No    Medication changes reported     No    Fall or balance concerns reported    No    Warm-up and Cool-down Performed on first and last piece of equipment    Resistance Training Performed Yes    VAD Patient? No    PAD/SET Patient? No      Pain Assessment   Currently in Pain? No/denies                Social History   Tobacco Use  Smoking Status Former   Packs/day: 1.00   Years: 30.00   Total pack years: 30.00   Types: Cigarettes   Quit date: 01/21/2012   Years since quitting: 10.5  Smokeless Tobacco Never    Goals Met:  Independence with exercise equipment Exercise tolerated well No report of concerns or symptoms today Strength training completed today  Goals Unmet:  Not Applicable  Comments: Pt able to follow exercise prescription today without complaint.  Will continue to monitor for progression.  Reviewed home exercise with pt today.  Pt plans to walk and use staff videos at home for exercise.  She also has a pedal machine. Reviewed THR, pulse, RPE, sign and symptoms, pulse oximetery and when to call 911 or MD.  Also discussed weather considerations and indoor options.  Pt voiced understanding.   Dr. Emily Filbert is Medical Director for [place].  Dr. Ottie Glazier is Medical Director for [facility] Pulmonary  Rehabilitation.

## 2022-07-28 ENCOUNTER — Ambulatory Visit
Admission: RE | Admit: 2022-07-28 | Discharge: 2022-07-28 | Disposition: A | Discharge status: Home / Self Care | Payer: Medicare Other | Source: Ambulatory Visit | Attending: Physician Assistant | Admitting: Physician Assistant

## 2022-07-28 DIAGNOSIS — Z87891 Personal history of nicotine dependence: Secondary | ICD-10-CM | POA: Insufficient documentation

## 2022-07-31 ENCOUNTER — Encounter: Payer: Medicare Other | Attending: Internal Medicine | Admitting: *Deleted

## 2022-07-31 DIAGNOSIS — J449 Chronic obstructive pulmonary disease, unspecified: Secondary | ICD-10-CM | POA: Diagnosis present

## 2022-07-31 DIAGNOSIS — Z4889 Encounter for other specified surgical aftercare: Secondary | ICD-10-CM | POA: Diagnosis not present

## 2022-07-31 NOTE — Progress Notes (Signed)
Daily Session Note  Patient Details  Name: Heidi [name] MRN: [id number] Date of Birth: 1956/07/05 Referring Provider:   Flowsheet Row Pulmonary Rehab from 07/10/2022 in [facility]  Referring Provider [name]       Encounter Date: 07/31/2022  Check In:  Session Check In - 07/31/22 1343       Check-In   Supervising physician immediately available to respond to emergencies See telemetry face sheet for immediately available ER MD    Location [facility]    Staff Present Justin Mend, Sharren Bridge, MS, ASCM CEP, Exercise Physiologist;Raywood Wailes Sherryll Burger, RN BSN    Virtual Visit No    Medication changes reported     No    Fall or balance concerns reported    No    Warm-up and Cool-down Performed on first and last piece of equipment    Resistance Training Performed Yes    VAD Patient? No    PAD/SET Patient? No      Pain Assessment   Currently in Pain? No/denies                Social History   Tobacco Use  Smoking Status Former   Packs/day: 1.00   Years: 30.00   Total pack years: 30.00   Types: Cigarettes   Quit date: 01/21/2012   Years since quitting: 10.5  Smokeless Tobacco Never    Goals Met:  Independence with exercise equipment Exercise tolerated well No report of concerns or symptoms today Strength training completed today  Goals Unmet:  Not Applicable  Comments: Pt able to follow exercise prescription today without complaint.  Will continue to monitor for progression.    Dr. Emily Filbert is Medical Director for [place].  Dr. Ottie Glazier is Medical Director for [facility].

## 2022-08-02 ENCOUNTER — Encounter: Payer: Self-pay | Admitting: *Deleted

## 2022-08-02 DIAGNOSIS — J449 Chronic obstructive pulmonary disease, unspecified: Secondary | ICD-10-CM

## 2022-08-02 NOTE — Progress Notes (Signed)
Pulmonary Individual Treatment Plan  Patient Details  Name: Heidi [name] MRN: [id number] Date of Birth: 1956-07-19 Referring Provider:   Flowsheet Row Pulmonary Rehab from 07/10/2022 in [facility] Cardiac and Pulmonary Rehab  Referring Provider Aleskerov       Initial Encounter Date:  Flowsheet Row Pulmonary Rehab from 07/10/2022 in [facility] Cardiac and Pulmonary Rehab  Date 07/10/22       Visit Diagnosis: Stage 4 very severe COPD by GOLD classification ([place])  Patient's Home Medications on Admission:  Current Outpatient Medications:    albuterol (PROVENTIL HFA;VENTOLIN HFA) 108 (90 Base) MCG/ACT inhaler, Inhale 1-2 puffs into the lungs every 4 (four) hours as needed for wheezing or shortness of breath., Disp: , Rfl:    albuterol (PROVENTIL) (2.5 MG/3ML) 0.083% nebulizer solution, Take 3 mLs (2.5 mg total) by nebulization every 6 (six) hours as needed for wheezing or shortness of breath., Disp: 360 mL, Rfl: 5   ALPRAZolam (XANAX) 0.25 MG tablet, Take 0.25 mg by mouth 2 (two) times daily., Disp: , Rfl:    azithromycin (ZITHROMAX) 250 MG tablet, Take by mouth., Disp: , Rfl:    DUPIXENT 300 MG/2ML SOPN, Inject into the skin., Disp: , Rfl:    EPINEPHrine 0.3 mg/0.3 mL IJ SOAJ injection, Inject into the muscle., Disp: , Rfl:    escitalopram (LEXAPRO) 20 MG tablet, Take 20 mg by mouth daily., Disp: , Rfl:    fluticasone (FLONASE) 50 MCG/ACT nasal spray, USE 2 SPRAYS IN EACH NOSTRIL ONCE DAILY, Disp: 16 g, Rfl: 5   guaiFENesin-codeine 100-10 MG/5ML syrup, Take 5 mLs by mouth every 6 (six) hours as needed for cough., Disp: 118 mL, Rfl: 0   Ipratropium-Albuterol (COMBIVENT RESPIMAT) 20-100 MCG/ACT AERS respimat, INHALE 1 PUFF BY MOUTH EVERY 6 HOURS AS NEEDED FOR WHEEZING, Disp: 4 g, Rfl: 5   ipratropium-albuterol (DUONEB) 0.5-2.5 (3) MG/3ML SOLN, Take 3 mLs by nebulization every 4 (four) hours as needed., Disp: 360 mL, Rfl: 2   mirtazapine (REMERON) 30 MG tablet, Take 30 mg by mouth at bedtime.,  Disp: , Rfl:    montelukast (SINGULAIR) 10 MG tablet, Take 10 mg by mouth at bedtime., Disp: , Rfl:    mupirocin ointment (BACTROBAN) 2 %, Apply 1 application topically daily. Apply to wound QD until healed. (Patient not taking: Reported on 06/28/2022), Disp: 22 g, Rfl: 0   predniSONE (DELTASONE) 5 MG tablet, Take 5 mg by mouth daily., Disp: , Rfl:    TRELEGY ELLIPTA 100-62.5-25 MCG/INH AEPB, INHALE 1 PUFF ONCE DAILY -  NEED  APPT  FOR  FURTHER  REFILLS, Disp: 60 each, Rfl: 1  Past Medical History: Past Medical History:  Diagnosis Date   Anxiety    COPD (chronic obstructive pulmonary disease) (HCC)    GERD (gastroesophageal reflux disease)    Hypotension    limiting med titration   NSTEMI (non-ST elevated myocardial infarction) ([place]) 2013   12/2011 with normal coronaries possibly secondary to Takotsubo (EF 30-35% by echo), occurred following two episodes of acute respiratory distress   Pneumonia 11/2016   history of   Takotsubo syndrome 4/13    Tobacco Use: Social History   Tobacco Use  Smoking Status Former   Packs/day: 1.00   Years: 30.00   Total pack years: 30.00   Types: Cigarettes   Quit date: 01/21/2012   Years since quitting: 10.5  Smokeless Tobacco Never    Labs: Review Flowsheet  More data exists      Latest Ref Rng & Units 02/01/2012 08/08/2012 04/04/2019  07/09/2020 11/05/2020  Labs for ITP Cardiac and Pulmonary Rehab  Hemoglobin A1c 4.8 - 5.6 % - - - - 5.8   PH, Arterial - 7.409  7.332  7.206  7.46  - -  PCO2 arterial mmHg 39.4  47.3  46.5  41  - -  Bicarbonate 20.0 - 28.0 mmol/L 24.4  25.1  18.4  29.2  31.6  31.9  -  TCO2 0 - 100 mmol/L 21._0 - - -  Acid-base deficit 0.0 - 2.0 mmol/L - 1.0  9.0  - - -  O2 Saturation % 94.1  99.0  100.0  97.5  92.4  94.0  -     Pulmonary Assessment Scores:  Pulmonary Assessment Scores     Row Name 07/10/22 1648         ADL UCSD   SOB Score total 87     Rest 1     Walk 4     Stairs 5     Bath 2     Dress 2      Shop 4       CAT Score   CAT Score 27       mMRC Score   mMRC Score 3              UCSD: Self-administered rating of dyspnea associated with activities of daily living (ADLs) 6-point scale (0 = "not at all" to 5 = "maximal or unable to do because of breathlessness")  Scoring Scores range from 0 to 120.  Minimally important difference is 5 units  CAT: CAT can identify the health impairment of COPD patients and is better correlated with disease progression.  CAT has a scoring range of zero to 40. The CAT score is classified into four groups of low (less than 10), medium (10 - 20), high (21-30) and very high (31-40) based on the impact level of disease on health status. A CAT score over 10 suggests significant symptoms.  A worsening CAT score could be explained by an exacerbation, poor medication adherence, poor inhaler technique, or progression of COPD or comorbid conditions.  CAT MCID is 2 points  mMRC: mMRC (Modified Medical Research Council) Dyspnea Scale is used to assess the degree of baseline functional disability in patients of respiratory disease due to dyspnea. No minimal important difference is established. A decrease in score of 1 point or greater is considered a positive change.   Pulmonary Function Assessment:  Pulmonary Function Assessment - 07/10/22 1643       Initial Spirometry Results   FVC% 56 %    FEV1% 26 %    FEV1/FVC Ratio 38      Post Bronchodilator Spirometry Results   FVC% 68 %    FEV1% 28 %    FEV1/FVC Ratio 34      Breath   Shortness of Breath Yes;Limiting activity             Exercise Target Goals: Exercise Program Goal: Individual exercise prescription set using results from initial 6 min walk test and THRR while considering  patient's activity barriers and safety.   Exercise Prescription Goal: Initial exercise prescription builds to 30-45 minutes a day of aerobic activity, 2-3 days per week.  Home exercise guidelines will be given to  patient during program as part of exercise prescription that the participant will acknowledge.  Education: Aerobic Exercise: - Group verbal and visual presentation on the components of exercise prescription. Introduces F.I.T.T principle from ACSM  for exercise prescriptions.  Reviews F.I.T.T. principles of aerobic exercise including progression. Written material given at graduation.   Education: Resistance Exercise: - Group verbal and visual presentation on the components of exercise prescription. Introduces F.I.T.T principle from ACSM for exercise prescriptions  Reviews F.I.T.T. principles of resistance exercise including progression. Written material given at graduation. Flowsheet Row Pulmonary Rehab from 07/26/2022 in [facility] Cardiac and Pulmonary Rehab  Date 07/12/22  Educator NT  Instruction Review Code 1- [employer] Understanding        Education: Exercise & Equipment Safety: - Individual verbal instruction and demonstration of equipment use and safety with use of the equipment. Flowsheet Row Pulmonary Rehab from 07/26/2022 in [facility] Cardiac and Pulmonary Rehab  Date 07/10/22  Educator [facility]  Instruction Review Code 1- Verbalizes Understanding       Education: Exercise Physiology & General Exercise Guidelines: - Group verbal and written instruction with models to review the exercise physiology of the cardiovascular system and associated critical values. Provides general exercise guidelines with specific guidelines to those with heart or lung disease.  Flowsheet Row Pulmonary Rehab from 10/13/2020 in [facility] Cardiac and Pulmonary Rehab  Date 10/13/20  Educator [facility]  Instruction Review Code 1- Verbalizes Understanding       Education: Flexibility, Balance, Mind/Body Relaxation: - Group verbal and visual presentation with interactive activity on the components of exercise prescription. Introduces F.I.T.T principle from ACSM for exercise prescriptions. Reviews F.I.T.T. principles of  flexibility and balance exercise training including progression. Also discusses the mind body connection.  Reviews various relaxation techniques to help reduce and manage stress (i.e. Deep breathing, progressive muscle relaxation, and visualization). Balance handout provided to take home. Written material given at graduation. Flowsheet Row Pulmonary Rehab from 07/26/2022 in [facility] Cardiac and Pulmonary Rehab  Date 07/12/22  Educator NT  Instruction Review Code 1- Verbalizes Understanding       Activity Barriers & Risk Stratification:  Activity Barriers & Cardiac Risk Stratification - 06/28/22 1414       Activity Barriers & Cardiac Risk Stratification   Activity Barriers Shortness of Breath             6 Minute Walk:  6 Minute Walk     Row Name 07/10/22 1624         6 Minute Walk   Phase Initial     Distance 640 feet     Walk Time 5.25 minutes     # of Rest Breaks 3     MPH 1.38     METS 2.72     RPE 13     Perceived Dyspnea  3     VO2 Peak 9.51     Symptoms Yes (comment)     Comments slight lightheadedness     Resting HR 92 bpm     Resting BP 142/80     Resting Oxygen Saturation  96 %     Exercise Oxygen Saturation  during 6 min walk 88 %     Max Ex. HR 117 bpm     Max Ex. BP 188/82     2 Minute Post BP 142/80       Interval HR   1 Minute HR 100     2 Minute HR 104     3 Minute HR 110     4 Minute HR 100     5 Minute HR 106     6 Minute HR 117     Interval Heart Rate? Yes  Interval Oxygen   Interval Oxygen? Yes     Baseline Oxygen Saturation % 96 %     1 Minute Oxygen Saturation % 93 %     1 Minute Liters of Oxygen 3 L     2 Minute Oxygen Saturation % 92 %     2 Minute Liters of Oxygen 3 L     3 Minute Oxygen Saturation % 91 %     3 Minute Liters of Oxygen 3 L     4 Minute Oxygen Saturation % 90 %     4 Minute Liters of Oxygen 3 L     5 Minute Oxygen Saturation % 88 %     5 Minute Liters of Oxygen 3 L     6 Minute Oxygen Saturation % 92 %      6 Minute Liters of Oxygen 3 L     2 Minute Post Oxygen Saturation % 94 %     2 Minute Post Liters of Oxygen 3 L             Oxygen Initial Assessment:  Oxygen Initial Assessment - 07/10/22 1643       Home Oxygen   Home Oxygen Device Home Concentrator;Portable Concentrator    Sleep Oxygen Prescription BiPAP;Continuous    Liters per minute 3    Home Exercise Oxygen Prescription Continuous    Liters per minute 3    Home Resting Oxygen Prescription Continuous    Liters per minute 3    Compliance with Home Oxygen Use Yes      Initial 6 min Walk   Oxygen Used Continuous    Liters per minute 3      Program Oxygen Prescription   Program Oxygen Prescription E-Tanks;Continuous    Liters per minute 3      Intervention   Short Term Goals To learn and exhibit compliance with exercise, home and travel O2 prescription;To learn and understand importance of monitoring SPO2 with pulse oximeter and demonstrate accurate use of the pulse oximeter.;To learn and understand importance of maintaining oxygen saturations>88%;To learn and demonstrate proper pursed lip breathing techniques or other breathing techniques. ;To learn and demonstrate proper use of respiratory medications    Long  Term Goals Exhibits compliance with exercise, home  and travel O2 prescription;Verbalizes importance of monitoring SPO2 with pulse oximeter and return demonstration;Maintenance of O2 saturations>88%;Exhibits proper breathing techniques, such as pursed lip breathing or other method taught during program session;Compliance with respiratory medication;Demonstrates proper use of MDI's             Oxygen Re-Evaluation:  Oxygen Re-Evaluation     Row Name 07/12/22 1408             Goals/Expected Outcomes   Short Term Goals To learn and exhibit compliance with exercise, home and travel O2 prescription;To learn and understand importance of monitoring SPO2 with pulse oximeter and demonstrate accurate use of the pulse  oximeter.;To learn and understand importance of maintaining oxygen saturations>88%;To learn and demonstrate proper pursed lip breathing techniques or other breathing techniques. ;To learn and demonstrate proper use of respiratory medications       Long  Term Goals Exhibits compliance with exercise, home  and travel O2 prescription;Verbalizes importance of monitoring SPO2 with pulse oximeter and return demonstration;Maintenance of O2 saturations>88%;Exhibits proper breathing techniques, such as pursed lip breathing or other method taught during program session;Compliance with respiratory medication;Demonstrates proper use of MDI's       Comments Reviewed PLB technique with pt.  Talked about how it works and it's importance in maintaining their exercise saturations.       Goals/Expected Outcomes Short: Become more profiecient at using PLB.   Long: Become independent at using PLB.                Oxygen Discharge (Final Oxygen Re-Evaluation):  Oxygen Re-Evaluation - 07/12/22 1408       Goals/Expected Outcomes   Short Term Goals To learn and exhibit compliance with exercise, home and travel O2 prescription;To learn and understand importance of monitoring SPO2 with pulse oximeter and demonstrate accurate use of the pulse oximeter.;To learn and understand importance of maintaining oxygen saturations>88%;To learn and demonstrate proper pursed lip breathing techniques or other breathing techniques. ;To learn and demonstrate proper use of respiratory medications    Long  Term Goals Exhibits compliance with exercise, home  and travel O2 prescription;Verbalizes importance of monitoring SPO2 with pulse oximeter and return demonstration;Maintenance of O2 saturations>88%;Exhibits proper breathing techniques, such as pursed lip breathing or other method taught during program session;Compliance with respiratory medication;Demonstrates proper use of MDI's    Comments Reviewed PLB technique with pt.  Talked about how  it works and it's importance in maintaining their exercise saturations.    Goals/Expected Outcomes Short: Become more profiecient at using PLB.   Long: Become independent at using PLB.             Initial Exercise Prescription:  Initial Exercise Prescription - 07/10/22 1600       Date of Initial Exercise RX and Referring Provider   Date 07/10/22    Referring Provider Aleskerov      Oxygen   Oxygen Continuous    Liters 3    Maintain Oxygen Saturation 88% or higher      Treadmill   MPH 1    Grade 0    Minutes 15    METs 1.8      Recumbant Bike   Level 1    RPM 50    Minutes 15    METs 2.72      NuStep   Level 1    SPM 80    Minutes 15    METs 2.72      REL-XR   Level 1    Speed 50    Minutes 15    METs 2.72      T5 Nustep   Level 1    SPM 60    Minutes 15    METs 2.72      Biostep-RELP   Level 1    SPM 50    Minutes 15    METs 2.72      Track   Laps 15    Minutes 15    METs 1.82      Prescription Details   Frequency (times per week) 3    Duration Progress to 30 minutes of continuous aerobic without signs/symptoms of physical distress      Intensity   THRR 40-80% of Max Heartrate 116-141    Ratings of Perceived Exertion 11-13    Perceived Dyspnea 0-4      Progression   Progression Continue to progress workloads to maintain intensity without signs/symptoms of physical distress.      Resistance Training   Training Prescription Yes    Weight 3    Reps 10-15             Perform Capillary Blood Glucose checks as needed.  Exercise Prescription Changes:   Exercise Prescription  Changes     Row Name 07/10/22 1600 07/31/22 1000           Response to Exercise   Blood Pressure (Admit) 142/80 118/62      Blood Pressure (Exercise) 188/82 168/70      Blood Pressure (Exit) 142/80 134/72      Heart Rate (Admit) 92 bpm 95 bpm      Heart Rate (Exercise) 117 bpm 118 bpm      Heart Rate (Exit) 107 bpm 105 bpm      Oxygen Saturation  (Admit) 96 % 98 %      Oxygen Saturation (Exercise) 88 % 90 %      Oxygen Saturation (Exit) 94 % 96 %      Rating of Perceived Exertion (Exercise) 13 15      Perceived Dyspnea (Exercise) 3 3      Symptoms slight lightheadedness SOB      Comments 6 MWT results 2nd full week of exercise      Duration -- Progress to 30 minutes of  aerobic without signs/symptoms of physical distress      Intensity -- THRR unchanged        Progression   Progression -- Continue to progress workloads to maintain intensity without signs/symptoms of physical distress.      Average METs -- 2.18        Resistance Training   Training Prescription -- Yes      Weight -- 3 lb      Reps -- 10-15        Interval Training   Interval Training -- No        Oxygen   Oxygen -- Continuous      Liters -- 2-3        Treadmill   MPH -- 1.3      Grade -- 0      Minutes -- 15      METs -- 2        Recumbant Bike   Level -- 2      Minutes -- 18        NuStep   Level -- 2      Minutes -- 15      METs -- 2        REL-XR   Level -- 2      Minutes -- 15      METs -- 3        Oxygen   Maintain Oxygen Saturation -- 88% or higher               Exercise Comments:   Exercise Goals and Review:   Exercise Goals     Arthur Name 07/10/22 1638             Exercise Goals   Increase Physical Activity Yes       Intervention Provide advice, education, support and counseling about physical activity/exercise needs.;Develop an individualized exercise prescription for aerobic and resistive training based on initial evaluation findings, risk stratification, comorbidities and participant's personal goals.       Expected Outcomes Short Term: Attend rehab on a regular basis to increase amount of physical activity.;Long Term: Add in home exercise to make exercise part of routine and to increase amount of physical activity.;Long Term: Exercising regularly at least 3-5 days a week.       Increase Strength and Stamina Yes        Intervention Provide advice, education, support and counseling about physical  activity/exercise needs.;Develop an individualized exercise prescription for aerobic and resistive training based on initial evaluation findings, risk stratification, comorbidities and participant's personal goals.       Expected Outcomes Short Term: Increase workloads from initial exercise prescription for resistance, speed, and METs.;Short Term: Perform resistance training exercises routinely during rehab and add in resistance training at home;Long Term: Improve cardiorespiratory fitness, muscular endurance and strength as measured by increased METs and functional capacity (6MWT)       Able to understand and use rate of perceived exertion (RPE) scale Yes       Intervention Provide education and explanation on how to use RPE scale       Expected Outcomes Short Term: Able to use RPE daily in rehab to express subjective intensity level;Long Term:  Able to use RPE to guide intensity level when exercising independently       Able to understand and use Dyspnea scale Yes       Intervention Provide education and explanation on how to use Dyspnea scale       Expected Outcomes Short Term: Able to use Dyspnea scale daily in rehab to express subjective sense of shortness of breath during exertion;Long Term: Able to use Dyspnea scale to guide intensity level when exercising independently       Knowledge and understanding of Target Heart Rate Range (THRR) Yes       Intervention Provide education and explanation of THRR including how the numbers were predicted and where they are located for reference       Expected Outcomes Short Term: Able to state/look up THRR;Short Term: Able to use daily as guideline for intensity in rehab;Long Term: Able to use THRR to govern intensity when exercising independently       Able to check pulse independently Yes       Intervention Provide education and demonstration on how to check pulse in carotid  and radial arteries.;Review the importance of being able to check your own pulse for safety during independent exercise       Expected Outcomes Short Term: Able to explain why pulse checking is important during independent exercise;Long Term: Able to check pulse independently and accurately       Understanding of Exercise Prescription Yes       Intervention Provide education, explanation, and written materials on patient's individual exercise prescription       Expected Outcomes Short Term: Able to explain program exercise prescription;Long Term: Able to explain home exercise prescription to exercise independently                Exercise Goals Re-Evaluation :  Exercise Goals Re-Evaluation     Row Name 07/12/22 1407 07/31/22 1027           Exercise Goal Re-Evaluation   Exercise Goals Review Increase Physical Activity;Increase Strength and Stamina;Able to understand and use rate of perceived exertion (RPE) scale;Able to understand and use Dyspnea scale;Knowledge and understanding of Target Heart Rate Range (THRR);Able to check pulse independently;Understanding of Exercise Prescription Increase Physical Activity;Increase Strength and Stamina;Understanding of Exercise Prescription      Comments Reviewed RPE and dyspnea scales, THR and program prescription with pt today.  Pt voiced understanding and was given a copy of goals to take home. Heidi [name] is doing well in rehab for the first couple of weeks she has been here. She has increased to level 2 on all of her seated machines and also is able to walk at 1.3 mph on the treadmill. Her  oxygen saturations are staying well above 88% each time. We will continue to monitor as she is progressing in the program.      Expected Outcomes Short: Use RPE daily to regulate intensity. Long: Follow program prescription in THR. Short: Continue to increase workloads Long: Increase overall MET level               Discharge Exercise Prescription (Final Exercise  Prescription Changes):  Exercise Prescription Changes - 07/31/22 1000       Response to Exercise   Blood Pressure (Admit) 118/62    Blood Pressure (Exercise) 168/70    Blood Pressure (Exit) 134/72    Heart Rate (Admit) 95 bpm    Heart Rate (Exercise) 118 bpm    Heart Rate (Exit) 105 bpm    Oxygen Saturation (Admit) 98 %    Oxygen Saturation (Exercise) 90 %    Oxygen Saturation (Exit) 96 %    Rating of Perceived Exertion (Exercise) 15    Perceived Dyspnea (Exercise) 3    Symptoms SOB    Comments 2nd full week of exercise    Duration Progress to 30 minutes of  aerobic without signs/symptoms of physical distress    Intensity THRR unchanged      Progression   Progression Continue to progress workloads to maintain intensity without signs/symptoms of physical distress.    Average METs 2.18      Resistance Training   Training Prescription Yes    Weight 3 lb    Reps 10-15      Interval Training   Interval Training No      Oxygen   Oxygen Continuous    Liters 2-3      Treadmill   MPH 1.3    Grade 0    Minutes 15    METs 2      Recumbant Bike   Level 2    Minutes 18      NuStep   Level 2    Minutes 15    METs 2      REL-XR   Level 2    Minutes 15    METs 3      Oxygen   Maintain Oxygen Saturation 88% or higher             Nutrition:  Target Goals: Understanding of nutrition guidelines, daily intake of sodium <1568m, cholesterol <2054m calories 30% from fat and 7% or less from saturated fats, daily to have 5 or more servings of fruits and vegetables.  Education: All About Nutrition: -Group instruction provided by verbal, written material, interactive activities, discussions, models, and posters to present general guidelines for heart healthy nutrition including fat, fiber, MyPlate, the role of sodium in heart healthy nutrition, utilization of the nutrition label, and utilization of this knowledge for meal planning. Follow up email sent as well. Written  material given at graduation. Flow[facility]  Date 07/26/22  [facility] 1- Verbalizes Understanding       Biometrics:  Pre Biometrics - 07/10/22 1639       Pre Biometrics   Height _0  (1.651 m)    Weight 151 lb 4.8 oz (68.6 kg)    Waist Circumference 43.5 inches    Hip Circumference 37 inches    Waist to Hip Ratio 1.18 %    BMI (Calculated) 25.18    Single Leg Stand 8.67 seconds  Nutrition Therapy Plan and Nutrition Goals:  Nutrition Therapy & Goals - 07/10/22 1640       Intervention Plan   Intervention Prescribe, educate and counsel regarding individualized specific dietary modifications aiming towards targeted core components such as weight, hypertension, lipid management, diabetes, heart failure and other comorbidities.    Expected Outcomes Short Term Goal: Understand basic principles of dietary content, such as calories, fat, sodium, cholesterol and nutrients.;Short Term Goal: A plan has been developed with personal nutrition goals set during dietitian appointment.;Long Term Goal: Adherence to prescribed nutrition plan.             Nutrition Assessments:  MEDIFICTS Score Key: ?70 Need to make dietary changes  40-70 Heart Healthy Diet ? 40 Therapeutic Level Cholesterol Diet  Flowsheet Row Pulmonary Rehab from 07/10/2022 in [facility] Cardiac and Pulmonary Rehab  Picture Your Plate Total Score on Admission 51      Picture Your Plate Scores: <08 Unhealthy dietary pattern with much room for improvement. 41-50 Dietary pattern unlikely to meet recommendations for good health and room for improvement. 51-60 More healthful dietary pattern, with some room for improvement.  >60 Healthy dietary pattern, although there may be some specific behaviors that could be improved.   Nutrition Goals Re-Evaluation:   Nutrition Goals Discharge (Final Nutrition Goals  Re-Evaluation):   Psychosocial: Target Goals: Acknowledge presence or absence of significant depression and/or stress, maximize coping skills, provide positive support system. Participant is able to verbalize types and ability to use techniques and skills needed for reducing stress and depression.   Education: Stress, Anxiety, and Depression - Group verbal and visual presentation to define topics covered.  Reviews how body is impacted by stress, anxiety, and depression.  Also discusses healthy ways to reduce stress and to treat/manage anxiety and depression.  Written material given at graduation. Flowsheet Row Pulmonary Rehab from 05/29/2016 in [facility] Cardiac and Pulmonary Rehab  Date 04/12/16  Educator Lucianne Lei, [facility]  Instruction Review Code (retired) 2- meets goals/outcomes       Education: Sleep Hygiene -Provides group verbal and written instruction about how sleep can affect your health.  Define sleep hygiene, discuss sleep cycles and impact of sleep habits. Review good sleep hygiene tips.    Initial Review & Psychosocial Screening:  Initial Psych Review & Screening - 06/28/22 1417       Initial Review   Current issues with Current Depression;Current Psychotropic Meds;Current Stress Concerns      Family Dynamics   Good Support System? Yes   partner Laverna Peace, daughter  10 min away     Barriers   Psychosocial barriers to participate in program There are no identifiable barriers or psychosocial needs.      Screening Interventions   Interventions Encouraged to exercise;To provide support and resources with identified psychosocial needs;Provide feedback about the scores to participant    Expected Outcomes Short Term goal: Utilizing psychosocial counselor, staff and physician to assist with identification of specific Stressors or current issues interfering with healing process. Setting desired goal for each stressor or current issue identified.;Long Term Goal: Stressors or current  issues are controlled or eliminated.;Short Term goal: Identification and review with participant of any Quality of Life or Depression concerns found by scoring the questionnaire.;Long Term goal: The participant improves quality of Life and PHQ9 Scores as seen by post scores and/or verbalization of changes             Quality of Life Scores:  Scores of 19 and below usually indicate a  poorer quality of life in these areas.  A difference of  2-3 points is a clinically meaningful difference.  A difference of 2-3 points in the total score of the Quality of Life Index has been associated with significant improvement in overall quality of life, self-image, physical symptoms, and general health in studies assessing change in quality of life.  PHQ-9: Review Flowsheet       07/10/2022 08/16/2020 05/26/2016 02/15/2016  Depression screen PHQ 2/9  Decreased Interest 1 1 0 0  Down, Depressed, Hopeless _0 0  PHQ - 2 Score _1 0  Altered sleeping _2 Tired, decreased energy _3 Change in appetite 0 0 0 1  Feeling bad or failure about yourself  0 0 0 0  Trouble concentrating 0 0 0 0  Moving slowly or fidgety/restless 0 0 0 0  Suicidal thoughts 0 0 0 0  PHQ-9 Score _4 Difficult doing work/chores Not difficult at all Somewhat difficult Somewhat difficult Not difficult at all   Interpretation of Total Score  Total Score Depression Severity:  1-4 = Minimal depression, 5-9 = Mild depression, 10-14 = Moderate depression, 15-19 = Moderately severe depression, 20-27 = Severe depression   Psychosocial Evaluation and Intervention:  Psychosocial Evaluation - 06/28/22 1424       Psychosocial Evaluation & Interventions   Comments Heidi [name] has no barriers to attending the program. She lives with her partner,Jimmy. He ands her daughter are her support.  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 She is doing well with the med. She is in the process of a Lung transplant workup. So far,  all is going well. Her goal this admission is to get to the exercise levels required by the transplant team to qualify for the transplant. This is a double lung transplant.  She is ready to attend the program. Work on the exercise requirements and build some strength and stamina, especially in her core.    Expected Outcomes STG Heidi [name] attends all scheduled sessions, she works with the EP to progress her exercise to the level required by her [facility] team. LTG Hong mets the requirements for her double lung transplant and she continues her exercise progression after her transplant.    Continue Psychosocial Services  Follow up required by staff             Psychosocial Re-Evaluation:   Psychosocial Discharge (Final Psychosocial Re-Evaluation):   Education: Education Goals: Education classes will be provided on a weekly basis, covering required topics. Participant will state understanding/return demonstration of topics presented.  Learning Barriers/Preferences:   General Pulmonary Education Topics:  Infection Prevention: - Provides verbal and written material to individual with discussion of infection control including proper hand washing and proper equipment cleaning during exercise session. Flowsheet Row Pulmonary Rehab from 07/26/2022 in [facility] Cardiac and Pulmonary Rehab  Date 07/10/22  Educator [facility]  Instruction Review Code 1- Verbalizes Understanding       Falls Prevention: - Provides verbal and written material to individual with discussion of falls prevention and safety. Flowsheet Row Pulmonary Rehab from 07/26/2022 in [facility] Cardiac and Pulmonary Rehab  Date 07/10/22  Educator [facility]  Instruction Review Code 1- Verbalizes Understanding       Chronic Lung Disease Review: - Group verbal instruction with posters, models, PowerPoint presentations and videos,  to review new updates, new respiratory medications, new advancements in procedures and treatments. Providing  information on websites and "  800" numbers for continued self-education. Includes information about supplement oxygen, available portable oxygen systems, continuous and intermittent flow rates, oxygen safety, concentrators, and Medicare reimbursement for oxygen. Explanation of Pulmonary Drugs, including class, frequency, complications, importance of spacers, rinsing mouth after steroid MDI's, and proper cleaning methods for nebulizers. Review of basic lung anatomy and physiology related to function, structure, and complications of lung disease. Review of risk factors. Discussion about methods for diagnosing sleep apnea and types of masks and machines for OSA. Includes a review of the use of types of environmental controls: home humidity, furnaces, filters, dust mite/pet prevention, HEPA vacuums. Discussion about weather changes, air quality and the benefits of nasal washing. Instruction on Warning signs, infection symptoms, calling MD promptly, preventive modes, and value of vaccinations. Review of effective airway clearance, coughing and/or vibration techniques. Emphasizing that all should Create an Action Plan. Written material given at graduation. Flowsheet Row Pulmonary Rehab from 10/13/2020 in [facility] Cardiac and Pulmonary Rehab  Date 09/29/20  Educator [facility]  Instruction Review Code 1- Verbalizes Understanding       AED/CPR: - Group verbal and written instruction with the use of models to demonstrate the basic use of the AED with the basic ABC's of resuscitation.    Anatomy and Cardiac Procedures: - Group verbal and visual presentation and models provide information about basic cardiac anatomy and function. Reviews the testing methods done to diagnose heart disease and the outcomes of the test results. Describes the treatment choices: Medical Management, Angioplasty, or Coronary Bypass Surgery for treating various heart conditions including Myocardial Infarction, Angina, Valve Disease, and Cardiac  Arrhythmias.  Written material given at graduation. Flowsheet Row Pulmonary Rehab from 07/26/2022 in [facility] Cardiac and Pulmonary Rehab  Date 07/19/22  Educator [facility]  Instruction Review Code 1- Verbalizes Understanding       Medication Safety: - Group verbal and visual instruction to review commonly prescribed medications for heart and lung disease. Reviews the medication, class of the drug, and side effects. Includes the steps to properly store meds and maintain the prescription regimen.  Written material given at graduation. Flowsheet Row Pulmonary Rehab from 10/13/2020 in [facility] Cardiac and Pulmonary Rehab  Date 09/15/20  Educator [facility]  Instruction Review Code 1- Verbalizes Understanding       Other: -Provides group and verbal instruction on various topics (see comments)   Knowledge Questionnaire Score:  Knowledge Questionnaire Score - 07/10/22 1642       Knowledge Questionnaire Score   Pre Score 18/18              Core Components/Risk Factors/Patient Goals at Admission:  Personal Goals and Risk Factors at Admission - 07/10/22 1640       Core Components/Risk Factors/Patient Goals on Admission    Weight Management Yes    Intervention Weight Management: Develop a combined nutrition and exercise program designed to reach desired caloric intake, while maintaining appropriate intake of nutrient and fiber, sodium and fats, and appropriate energy expenditure required for the weight goal.;Weight Management: Provide education and appropriate resources to help participant work on and attain dietary goals.    Admit Weight 151 lb 4.8 oz (68.6 kg)    Goal Weight: Short Term 150 lb (68 kg)    Goal Weight: Long Term 150 lb (68 kg)    Expected Outcomes Short Term: Continue to assess and modify interventions until short term weight is achieved;Long Term: Adherence to nutrition and physical activity/exercise program aimed toward attainment of established weight goal;Weight Maintenance:  Understanding of the  daily nutrition guidelines, which includes 25-35% calories from fat, 7% or less cal from saturated fats, less than 269m cholesterol, less than 1.5gm of sodium, & 5 or more servings of fruits and vegetables daily;Understanding of distribution of calorie intake throughout the day with the consumption of 4-5 meals/snacks;Understanding recommendations for meals to include 15-35% energy as protein, 25-35% energy from fat, 35-60% energy from carbohydrates, less than 2056mof dietary cholesterol, 20-35 gm of total fiber daily    Improve shortness of breath with ADL's Yes    Intervention Provide education, individualized exercise plan and daily activity instruction to help decrease symptoms of SOB with activities of daily living.    Expected Outcomes Short Term: Improve cardiorespiratory fitness to achieve a reduction of symptoms when performing ADLs;Long Term: Be able to perform more ADLs without symptoms or delay the onset of symptoms    Increase knowledge of respiratory medications and ability to use respiratory devices properly  Yes    Intervention Provide education and demonstration as needed of appropriate use of medications, inhalers, and oxygen therapy.    Expected Outcomes Short Term: Achieves understanding of medications use. Understands that oxygen is a medication prescribed by physician. Demonstrates appropriate use of inhaler and oxygen therapy.;Long Term: Maintain appropriate use of medications, inhalers, and oxygen therapy.             Education:Diabetes - Individual verbal and written instruction to review signs/symptoms of diabetes, desired ranges of glucose level fasting, after meals and with exercise. Acknowledge that pre and post exercise glucose checks will be done for 3 sessions at entry of program.   Know Your Numbers and Heart Failure: - Group verbal and visual instruction to discuss disease risk factors for cardiac and pulmonary disease and treatment options.   Reviews associated critical values for Overweight/Obesity, Hypertension, Cholesterol, and Diabetes.  Discusses basics of heart failure: signs/symptoms and treatments.  Introduces Heart Failure Zone chart for action plan for heart failure.  Written material given at graduation.   Core Components/Risk Factors/Patient Goals Review:    Core Components/Risk Factors/Patient Goals at Discharge (Final Review):    ITP Comments:  ITP Comments     Row Name 06/28/22 1424 07/10/22 1623 07/12/22 1406 08/02/22 0703     ITP Comments Virtual orientation call completed today. shehas an appointment on Date: 07/10/2022  for EP eval and gym Orientation.  [facility] . Completed 6MWT and gym orientation. Initial ITP created and sent for review to Dr. FaZetta BillsMedical Director. First full day of exercise!  Patient was oriented to gym and equipment including functions, settings, policies, and procedures.  Patient's individual exercise prescription and treatment plan were reviewed.  All starting workloads were established based on the results of the 6 minute walk test done at initial orientation visit.  The plan for exercise progression was also introduced and progression will be customized based on patient's performance and goals. 30 Day review completed. Medical Director ITP review done, changes made as directed, and signed approval by Medical Director.    New to program             Comments:

## 2022-08-03 ENCOUNTER — Other Ambulatory Visit: Payer: Self-pay | Admitting: Acute Care

## 2022-08-03 ENCOUNTER — Encounter: Payer: Medicare Other | Admitting: *Deleted

## 2022-08-03 ENCOUNTER — Telehealth: Payer: Self-pay | Admitting: Acute Care

## 2022-08-03 DIAGNOSIS — J449 Chronic obstructive pulmonary disease, unspecified: Secondary | ICD-10-CM

## 2022-08-03 DIAGNOSIS — Z87891 Personal history of nicotine dependence: Secondary | ICD-10-CM

## 2022-08-03 DIAGNOSIS — R911 Solitary pulmonary nodule: Secondary | ICD-10-CM

## 2022-08-03 NOTE — Telephone Encounter (Signed)
CT results faxed to PCP with follow up plans included. Order placed for 3 mth nodule f/u LCS CT. Will schedule appt with Dr Patsey Berthold once we know the date of the future CT.

## 2022-08-03 NOTE — Progress Notes (Signed)
Daily Session Note  Patient Details  Name: TALULLAH ABATE MRN: [id number] Date of Birth: [birth year] Referring Provider:   Flowsheet Row Pulmonary Rehab from 07/10/2022 in [facility] Cardiac and Pulmonary Rehab  Referring Provider [name]       Encounter Date: 08/03/2022  Check In:  Session Check In - 08/03/22 1414       Check-In   Supervising physician immediately available to respond to emergencies See telemetry face sheet for immediately available ER MD    Location [facility]-Cardiac & Pulmonary Rehab    Staff Present Heath Lark, RN, BSN, CCRP;Joseph Hood, RCP,RRT,BSRT;Noah Tickle, [place], Exercise Physiologist    Virtual Visit No    Medication changes reported     No    Fall or balance concerns reported    No    Warm-up and Cool-down Performed on first and last piece of equipment    Resistance Training Performed Yes    VAD Patient? No    PAD/SET Patient? No      Pain Assessment   Currently in Pain? No/denies                Social History   Tobacco Use  Smoking Status Former   Packs/day: 1.00   Years: 30.00   Total pack years: 30.00   Types: Cigarettes   Quit date: 01/21/2012   Years since quitting: 10.5  Smokeless Tobacco Never    Goals Met:  Proper associated with RPD/PD & O2 Sat Independence with exercise equipment Exercise tolerated well No report of concerns or symptoms today  Goals Unmet:  Not Applicable  Comments: Pt able to follow exercise prescription today without complaint.  Will continue to monitor for progression.    Dr. Emily Filbert is Medical Director for [place].  Dr. Ottie Glazier is Medical Director for [facility] Pulmonary Rehabilitation.

## 2022-08-03 NOTE — Telephone Encounter (Signed)
I have called the patient with the results of her low dose CT Chest. I explained that there is a small nodule in the LUL with a solid component of 2.5 mm. I explained that we will need to do a 3 month follow up CT Chest to re-evaluate this area. She is in agreement with this plan. She is currently being evaluated at [facility] for a double lung transplant . I have made a copy of the result, and have mailed it to her so that she can share the results with the MD at [facility].   Heidi [name], 3 month follow up due the end of December ( After 10/27/2022) and then a follow up appointment with Dr. Darnell Level in the office to review the results.   Please order 3 month follow up scan and Fax results to PCP. Let her know plan is for a 3 month follow up.  Thanks so much

## 2022-08-07 ENCOUNTER — Encounter: Payer: Medicare Other | Admitting: *Deleted

## 2022-08-07 DIAGNOSIS — J449 Chronic obstructive pulmonary disease, unspecified: Secondary | ICD-10-CM

## 2022-08-07 NOTE — Progress Notes (Signed)
Daily Session Note  Patient Details  Name: Heidi [name] MRN: [id number] Date of Birth: July 10, 1956 Referring Provider:   Flowsheet Row Pulmonary Rehab from 07/10/2022 in [facility] Cardiac and Pulmonary Rehab  Referring Provider [name]       Encounter Date: 08/07/2022  Check In:  Session Check In - 08/07/22 1420       Check-In   Supervising physician immediately available to respond to emergencies See telemetry face sheet for immediately available ER MD    Location [facility]-Cardiac & Pulmonary Rehab    Staff Present Justin Mend, RCP,RRT,BSRT;Demarian Epps Sherryll Burger, RN Odelia Gage, RN, ADN    Virtual Visit No    Medication changes reported     No    Fall or balance concerns reported    No    Warm-up and Cool-down Performed on first and last piece of equipment    Resistance Training Performed Yes    VAD Patient? No    PAD/SET Patient? No      Pain Assessment   Currently in Pain? No/denies                Social History   Tobacco Use  Smoking Status Former   Packs/day: 1.00   Years: 30.00   Total pack years: 30.00   Types: Cigarettes   Quit date: 01/21/2012   Years since quitting: 10.5  Smokeless Tobacco Never    Goals Met:  Independence with exercise equipment Exercise tolerated well No report of concerns or symptoms today Strength training completed today  Goals Unmet:  Not Applicable  Comments: Pt able to follow exercise prescription today without complaint.  Will continue to monitor for progression.    Dr. Emily Filbert is Medical Director for [place].  Dr. Ottie Glazier is Medical Director for [facility] Pulmonary Rehabilitation.

## 2022-08-08 DIAGNOSIS — J449 Chronic obstructive pulmonary disease, unspecified: Secondary | ICD-10-CM

## 2022-08-08 NOTE — Progress Notes (Signed)
Completed initial RD consultation ?

## 2022-08-10 ENCOUNTER — Encounter: Payer: Medicare Other | Admitting: *Deleted

## 2022-08-10 DIAGNOSIS — J449 Chronic obstructive pulmonary disease, unspecified: Secondary | ICD-10-CM

## 2022-08-10 NOTE — Progress Notes (Signed)
Daily Session Note  Patient Details  Name: Heidi [name] MRN: [id number] Date of Birth: 1956/08/02 Referring Provider:   Flowsheet Row Pulmonary Rehab from 07/10/2022 in [facility] Cardiac and Pulmonary Rehab  Referring Provider [name]       Encounter Date: 08/10/2022  Check In:  Session Check In - 08/10/22 1410       Check-In   Supervising physician immediately available to respond to emergencies See telemetry face sheet for immediately available ER MD    Location [facility]-Cardiac & Pulmonary Rehab    Staff Present Antionette Fairy, BS, Exercise Physiologist;Jessica [place], MA, [place], CCRP, Mindi Curling, RN, [place]    Virtual Visit Yes    Medication changes reported     No    Fall or balance concerns reported    No    Warm-up and Cool-down Performed on first and last piece of equipment    Resistance Training Performed Yes    VAD Patient? No    PAD/SET Patient? No      Pain Assessment   Currently in Pain? No/denies                Social History   Tobacco Use  Smoking Status Former   Packs/day: 1.00   Years: 30.00   Total pack years: 30.00   Types: Cigarettes   Quit date: 01/21/2012   Years since quitting: 10.5  Smokeless Tobacco Never    Goals Met:  Independence with exercise equipment Exercise tolerated well No report of concerns or symptoms today Strength training completed today  Goals Unmet:  Not Applicable  Comments: Pt able to follow exercise prescription today without complaint.  Will continue to monitor for progression.    Dr. Emily Filbert is Medical Director for [place].  Dr. Ottie Glazier is Medical Director for [facility] Pulmonary Rehabilitation.

## 2022-08-14 ENCOUNTER — Encounter: Payer: Medicare Other | Admitting: *Deleted

## 2022-08-14 DIAGNOSIS — J449 Chronic obstructive pulmonary disease, unspecified: Secondary | ICD-10-CM

## 2022-08-14 NOTE — Progress Notes (Signed)
Daily Session Note  Patient Details  Name: Heidi [name] MRN: [id number] Date of Birth: 1955/11/03 Referring Provider:   Flowsheet Row Pulmonary Rehab from 07/10/2022 in [facility] Cardiac and Pulmonary Rehab  Referring Provider [name]       Encounter Date: 08/14/2022  Check In:  Session Check In - 08/14/22 1340       Check-In   Supervising physician immediately available to respond to emergencies See telemetry face sheet for immediately available ER MD    Location [facility]-Cardiac & Pulmonary Rehab    Staff Present Renita Papa, RN BSN;Megan Tamala Julian, RN, Terie Purser, RCP,RRT,BSRT    Virtual Visit No    Medication changes reported     Yes    Comments starting buspar    Fall or balance concerns reported    No    Warm-up and Cool-down Performed on first and last piece of equipment    Resistance Training Performed Yes    VAD Patient? No    PAD/SET Patient? No      Pain Assessment   Currently in Pain? No/denies                Social History   Tobacco Use  Smoking Status Former   Packs/day: 1.00   Years: 30.00   Total pack years: 30.00   Types: Cigarettes   Quit date: 01/21/2012   Years since quitting: 10.5  Smokeless Tobacco Never    Goals Met:  Independence with exercise equipment Exercise tolerated well No report of concerns or symptoms today Strength training completed today  Goals Unmet:  Not Applicable  Comments: Pt able to follow exercise prescription today without complaint.  Will continue to monitor for progression.    Dr. Emily Filbert is Medical Director for [place].  Dr. Ottie Glazier is Medical Director for [place] Pulmonary Rehabilitation.

## 2022-08-17 ENCOUNTER — Encounter: Payer: Medicare Other | Admitting: *Deleted

## 2022-08-17 DIAGNOSIS — J449 Chronic obstructive pulmonary disease, unspecified: Secondary | ICD-10-CM

## 2022-08-17 NOTE — Progress Notes (Signed)
Daily Session Note  Patient Details  Name: Heidi [name] MRN: [id number] Date of Birth: Sep 30, 1956 Referring Provider:   Flowsheet Row Pulmonary Rehab from 07/10/2022 in [facility] Cardiac and Pulmonary Rehab  Referring Provider [name]       Encounter Date: 08/17/2022  Check In:  Session Check In - 08/17/22 1343       Check-In   Supervising physician immediately available to respond to emergencies See telemetry face sheet for immediately available ER MD    Location [facility]-Cardiac & Pulmonary Rehab    Staff Present Justin Mend, Lorre Nick, MA, RCEP, CCRP, CCET;Parke Jandreau Sherryll Burger, RN BSN    Virtual Visit No    Medication changes reported     No    Fall or balance concerns reported    No    Warm-up and Cool-down Performed on first and last piece of equipment    Resistance Training Performed Yes    VAD Patient? No    PAD/SET Patient? No      Pain Assessment   Currently in Pain? No/denies                Social History   Tobacco Use  Smoking Status Former   Packs/day: 1.00   Years: 30.00   Total pack years: 30.00   Types: Cigarettes   Quit date: 01/21/2012   Years since quitting: 10.5  Smokeless Tobacco Never    Goals Met:  Independence with exercise equipment Exercise tolerated well No report of concerns or symptoms today Strength training completed today  Goals Unmet:  Not Applicable  Comments: Pt able to follow exercise prescription today without complaint.  Will continue to monitor for progression.    Dr. Emily Filbert is Medical Director for [place].  Dr. Ottie Glazier is Medical Director for [facility] Pulmonary Rehabilitation.

## 2022-08-21 ENCOUNTER — Encounter: Payer: Medicare Other | Admitting: *Deleted

## 2022-08-21 DIAGNOSIS — J449 Chronic obstructive pulmonary disease, unspecified: Secondary | ICD-10-CM | POA: Diagnosis not present

## 2022-08-21 NOTE — Progress Notes (Signed)
Daily Session Note  Patient Details  Name: Heidi [name] MRN: [id number] Date of Birth: 02/14/1956 Referring Provider:   Flowsheet Row Pulmonary Rehab from 07/10/2022 in [facility]  Referring Provider Aleskerov       Encounter Date: 08/21/2022  Check In:  Session Check In - 08/21/22 1337       Check-In   Supervising physician immediately available to respond to emergencies See telemetry face sheet for immediately available ER MD    Location [facility]    Staff Present Joseph Hood, RCP,RRT,BSRT;Jessica Hawkins, MA, RCEP, CCRP, CCET;Megan Smith, RN, ADN    Virtual Visit No    Medication changes reported     No    Fall or balance concerns reported    No    Warm-up and Cool-down Performed on first and last piece of equipment    Resistance Training Performed Yes    VAD Patient? No    PAD/SET Patient? No      Pain Assessment   Currently in Pain? No/denies                Social History   Tobacco Use  Smoking Status Former   Packs/day: 1.00   Years: 30.00   Total pack years: 30.00   Types: Cigarettes   Quit date: 01/21/2012   Years since quitting: 10.5  Smokeless Tobacco Never    Goals Met:  Independence with exercise equipment Exercise tolerated well Personal goals reviewed No report of concerns or symptoms today Strength training completed today  Goals Unmet:  Not Applicable  Comments: Pt able to follow exercise prescription today without complaint.  Will continue to monitor for progression.    Dr. Mark Miller is Medical Director for [facility].  Dr. Fuad Aleskerov is Medical Director for [facility]. 

## 2022-08-23 ENCOUNTER — Encounter: Payer: Medicare Other | Admitting: *Deleted

## 2022-08-23 DIAGNOSIS — J449 Chronic obstructive pulmonary disease, unspecified: Secondary | ICD-10-CM | POA: Diagnosis not present

## 2022-08-23 NOTE — Progress Notes (Signed)
Daily Session Note  Patient Details  Name: Heidi [name] MRN: [id number] Date of Birth: 19-Feb-1956 Referring Provider:   Flowsheet Row Pulmonary Rehab from 07/10/2022 in [facility]  Referring Provider [name]       Encounter Date: 08/23/2022  Check In:  Session Check In - 08/23/22 1352       Check-In   Supervising physician immediately available to respond to emergencies See telemetry face sheet for immediately available ER MD    Location [facility]    Staff Present Renita Papa, RN Odelia Gage, RN, ADN;Jessica Luan Pulling, MA, RCEP, CCRP, CCET    Virtual Visit No    Medication changes reported     No    Fall or balance concerns reported    No    Warm-up and Cool-down Performed on first and last piece of equipment    Resistance Training Performed Yes    VAD Patient? No    PAD/SET Patient? No      Pain Assessment   Currently in Pain? No/denies                Social History   Tobacco Use  Smoking Status Former   Packs/day: 1.00   Years: 30.00   Total pack years: 30.00   Types: Cigarettes   Quit date: 01/21/2012   Years since quitting: 10.5  Smokeless Tobacco Never    Goals Met:  Independence with exercise equipment Exercise tolerated well No report of concerns or symptoms today Strength training completed today  Goals Unmet:  Not Applicable  Comments: Pt able to follow exercise prescription today without complaint.  Will continue to monitor for progression.    Dr. Emily Filbert is Medical Director for [facility].  Dr. Ottie Glazier is Medical Director for [facility].

## 2022-08-24 ENCOUNTER — Encounter: Payer: Medicare Other | Admitting: *Deleted

## 2022-08-24 DIAGNOSIS — J449 Chronic obstructive pulmonary disease, unspecified: Secondary | ICD-10-CM

## 2022-08-24 NOTE — Progress Notes (Signed)
Daily Session Note  Patient Details  Name: Heidi [name] MRN: [id number] Date of Birth: Feb 01, 1956 Referring Provider:   Flowsheet Row Pulmonary Rehab from 07/10/2022 in [facility]  Referring Provider [name]       Encounter Date: 08/24/2022  Check In:  Session Check In - 08/24/22 1350       Check-In   Supervising physician immediately available to respond to emergencies See telemetry face sheet for immediately available ER MD    Location [facility]    Staff Present Antionette Fairy, BS, Exercise Physiologist;Joseph Rosebud Poles, RN, [place]    Virtual Visit No    Medication changes reported     No    Fall or balance concerns reported    No    Warm-up and Cool-down Performed on first and last piece of equipment    Resistance Training Performed Yes    VAD Patient? No    PAD/SET Patient? No      Pain Assessment   Currently in Pain? No/denies                Social History   Tobacco Use  Smoking Status Former   Packs/day: 1.00   Years: 30.00   Total pack years: 30.00   Types: Cigarettes   Quit date: 01/21/2012   Years since quitting: 10.5  Smokeless Tobacco Never    Goals Met:  Independence with exercise equipment Exercise tolerated well No report of concerns or symptoms today Strength training completed today  Goals Unmet:  Not Applicable  Comments: Pt able to follow exercise prescription today without complaint.  Will continue to monitor for progression.    Dr. Emily Filbert is Medical Director for [facility].  Dr. Ottie Glazier is Medical Director for [facility].

## 2022-08-28 ENCOUNTER — Encounter: Payer: Medicare Other | Admitting: *Deleted

## 2022-08-28 ENCOUNTER — Encounter (INDEPENDENT_AMBULATORY_CARE_PROVIDER_SITE_OTHER): Payer: Self-pay

## 2022-08-28 DIAGNOSIS — J449 Chronic obstructive pulmonary disease, unspecified: Secondary | ICD-10-CM

## 2022-08-28 NOTE — Progress Notes (Signed)
Daily Session Note  Patient Details  Name: Heidi [name] MRN: [id number] Date of Birth: November 12, 1955 Referring Provider:   Flowsheet Row Pulmonary Rehab from 07/10/2022 in [facility] Card[facility]monary Rehab  Referring Provider [name]       Encounter Date: 08/28/2022  Check In:  Session Check In - 08/28/22 1348       Check-In   Supervising physician immediately available to respond to emergencies See telemetry face sheet for immediately available ER MD    Location [facility]    Staff Present Justin Mend, RCP,RRT,BSRT;Francine Hannan Sherryll Burger, RN Odelia Gage, RN, ADN    Virtual Visit No    Medication changes reported     No    Fall or balance concerns reported    No    Warm-up and Cool-down Performed on first and last piece of equipment    Resistance Training Performed Yes    VAD Patient? No    PAD/SET Patient? No      Pain Assessment   Currently in Pain? No/denies                Social History   Tobacco Use  Smoking Status Former   Packs/day: 1.00   Years: 30.00   Total pack years: 30.00   Types: Cigarettes   Quit date: 01/21/2012   Years since quitting: 10.6  Smokeless Tobacco Never    Goals Met:  Independence with exercise equipment Exercise tolerated well No report of concerns or symptoms today Strength training completed today  Goals Unmet:  Not Applicable  Comments: Pt able to follow exercise prescription today without complaint.  Will continue to monitor for progression.    Dr. Emily Filbert is Medical Director for [facility].  Dr. Ottie Glazier is Medical Director for [facility] Pulmonary Rehabilitation.

## 2022-08-30 ENCOUNTER — Encounter: Payer: Medicare Other | Attending: Internal Medicine

## 2022-08-30 ENCOUNTER — Encounter: Payer: Self-pay | Admitting: *Deleted

## 2022-08-30 DIAGNOSIS — J449 Chronic obstructive pulmonary disease, unspecified: Secondary | ICD-10-CM | POA: Insufficient documentation

## 2022-08-30 NOTE — Progress Notes (Signed)
Pulmonary Individual Treatment Plan  Patient Details  Name: Heidi [name] MRN: [id number] Date of Birth: 03-08-1956 Referring Provider:   Flowsheet Row Pulmonary Rehab from 07/10/2022 in [facility] Cardiac and Pulmonary Rehab  Referring Provider Aleskerov       Initial Encounter Date:  Flowsheet Row Pulmonary Rehab from 07/10/2022 in [facility] Cardiac and Pulmonary Rehab  Date 07/10/22       Visit Diagnosis: Stage 4 very severe COPD by GOLD classification ([place])  Patient's Home Medications on Admission:  Current Outpatient Medications:    albuterol (PROVENTIL HFA;VENTOLIN HFA) 108 (90 Base) MCG/ACT inhaler, Inhale 1-2 puffs into the lungs every 4 (four) hours as needed for wheezing or shortness of breath., Disp: , Rfl:    albuterol (PROVENTIL) (2.5 MG/3ML) 0.083% nebulizer solution, Take 3 mLs (2.5 mg total) by nebulization every 6 (six) hours as needed for wheezing or shortness of breath., Disp: 360 mL, Rfl: 5   ALPRAZolam (XANAX) 0.25 MG tablet, Take 0.25 mg by mouth 2 (two) times daily., Disp: , Rfl:    azithromycin (ZITHROMAX) 250 MG tablet, Take by mouth., Disp: , Rfl:    DUPIXENT 300 MG/2ML SOPN, Inject into the skin., Disp: , Rfl:    EPINEPHrine 0.3 mg/0.3 mL IJ SOAJ injection, Inject into the muscle., Disp: , Rfl:    escitalopram (LEXAPRO) 20 MG tablet, Take 20 mg by mouth daily., Disp: , Rfl:    fluticasone (FLONASE) 50 MCG/ACT nasal spray, USE 2 SPRAYS IN EACH NOSTRIL ONCE DAILY, Disp: 16 g, Rfl: 5   guaiFENesin-codeine 100-10 MG/5ML syrup, Take 5 mLs by mouth every 6 (six) hours as needed for cough., Disp: 118 mL, Rfl: 0   Ipratropium-Albuterol (COMBIVENT RESPIMAT) 20-100 MCG/ACT AERS respimat, INHALE 1 PUFF BY MOUTH EVERY 6 HOURS AS NEEDED FOR WHEEZING, Disp: 4 g, Rfl: 5   ipratropium-albuterol (DUONEB) 0.5-2.5 (3) MG/3ML SOLN, Take 3 mLs by nebulization every 4 (four) hours as needed., Disp: 360 mL, Rfl: 2   mirtazapine (REMERON) 30 MG tablet, Take 30 mg by mouth at bedtime.,  Disp: , Rfl:    montelukast (SINGULAIR) 10 MG tablet, Take 10 mg by mouth at bedtime., Disp: , Rfl:    mupirocin ointment (BACTROBAN) 2 %, Apply 1 application topically daily. Apply to wound QD until healed. (Patient not taking: Reported on 06/28/2022), Disp: 22 g, Rfl: 0   predniSONE (DELTASONE) 5 MG tablet, Take 5 mg by mouth daily., Disp: , Rfl:    TRELEGY ELLIPTA 100-62.5-25 MCG/INH AEPB, INHALE 1 PUFF ONCE DAILY -  NEED  APPT  FOR  FURTHER  REFILLS, Disp: 60 each, Rfl: 1  Past Medical History: Past Medical History:  Diagnosis Date   Anxiety    COPD (chronic obstructive pulmonary disease) (HCC)    GERD (gastroesophageal reflux disease)    Hypotension    limiting med titration   NSTEMI (non-ST elevated myocardial infarction) ([place]) 2013   12/2011 with normal coronaries possibly secondary to Takotsubo (EF 30-35% by echo), occurred following two episodes of acute respiratory distress   Pneumonia 11/2016   history of   Takotsubo syndrome 4/13    Tobacco Use: Social History   Tobacco Use  Smoking Status Former   Packs/day: 1.00   Years: 30.00   Total pack years: 30.00   Types: Cigarettes   Quit date: 01/21/2012   Years since quitting: 10.6  Smokeless Tobacco Never    Labs: Review Flowsheet  More data exists      Latest Ref Rng & Units 02/01/2012 08/08/2012 04/04/2019  07/09/2020 11/05/2020  Labs for ITP Cardiac and Pulmonary Rehab  Hemoglobin A1c 4.8 - 5.6 % - - - - 5.8   PH, Arterial - 7.409  7.332  7.206  7.46  - -  PCO2 arterial mmHg 39.4  47.3  46.5  41  - -  Bicarbonate 20.0 - 28.0 mmol/L 24.4  25.1  18.4  29.2  31.6  31.9  -  TCO2 0 - 100 mmol/L 21._0 - - -  Acid-base deficit 0.0 - 2.0 mmol/L - 1.0  9.0  - - -  O2 Saturation % 94.1  99.0  100.0  97.5  92.4  94.0  -     Pulmonary Assessment Scores:  Pulmonary Assessment Scores     Row Name 07/10/22 1648         ADL UCSD   SOB Score total 87     Rest 1     Walk 4     Stairs 5     Bath 2     Dress 2      Shop 4       CAT Score   CAT Score 27       mMRC Score   mMRC Score 3              UCSD: Self-administered rating of dyspnea associated with activities of daily living (ADLs) 6-point scale (0 = "not at all" to 5 = "maximal or unable to do because of breathlessness")  Scoring Scores range from 0 to 120.  Minimally important difference is 5 units  CAT: CAT can identify the health impairment of COPD patients and is better correlated with disease progression.  CAT has a scoring range of zero to 40. The CAT score is classified into four groups of low (less than 10), medium (10 - 20), high (21-30) and very high (31-40) based on the impact level of disease on health status. A CAT score over 10 suggests significant symptoms.  A worsening CAT score could be explained by an exacerbation, poor medication adherence, poor inhaler technique, or progression of COPD or comorbid conditions.  CAT MCID is 2 points  mMRC: mMRC (Modified Medical Research Council) Dyspnea Scale is used to assess the degree of baseline functional disability in patients of respiratory disease due to dyspnea. No minimal important difference is established. A decrease in score of 1 point or greater is considered a positive change.   Pulmonary Function Assessment:  Pulmonary Function Assessment - 07/10/22 1643       Initial Spirometry Results   FVC% 56 %    FEV1% 26 %    FEV1/FVC Ratio 38      Post Bronchodilator Spirometry Results   FVC% 68 %    FEV1% 28 %    FEV1/FVC Ratio 34      Breath   Shortness of Breath Yes;Limiting activity             Exercise Target Goals: Exercise Program Goal: Individual exercise prescription set using results from initial 6 min walk test and THRR while considering  patient's activity barriers and safety.   Exercise Prescription Goal: Initial exercise prescription builds to 30-45 minutes a day of aerobic activity, 2-3 days per week.  Home exercise guidelines will be given to  patient during program as part of exercise prescription that the participant will acknowledge.  Education: Aerobic Exercise: - Group verbal and visual presentation on the components of exercise prescription. Introduces F.I.T.T principle from ACSM  for exercise prescriptions.  Reviews F.I.T.T. principles of aerobic exercise including progression. Written material given at graduation.   Education: Resistance Exercise: - Group verbal and visual presentation on the components of exercise prescription. Introduces F.I.T.T principle from ACSM for exercise prescriptions  Reviews F.I.T.T. principles of resistance exercise including progression. Written material given at graduation. Flowsheet Row Pulmonary Rehab from 08/23/2022 in [facility] Cardiac and Pulmonary Rehab  Date 07/12/22  Educator NT  Instruction Review Code 1- [employer] Understanding        Education: Exercise & Equipment Safety: - Individual verbal instruction and demonstration of equipment use and safety with use of the equipment. Flowsheet Row Pulmonary Rehab from 08/23/2022 in [facility] Cardiac and Pulmonary Rehab  Date 07/10/22  Educator [facility]  Instruction Review Code 1- Verbalizes Understanding       Education: Exercise Physiology & General Exercise Guidelines: - Group verbal and written instruction with models to review the exercise physiology of the cardiovascular system and associated critical values. Provides general exercise guidelines with specific guidelines to those with heart or lung disease.  Flowsheet Row Pulmonary Rehab from 10/13/2020 in [facility] Cardiac and Pulmonary Rehab  Date 10/13/20  Educator [facility]  Instruction Review Code 1- Verbalizes Understanding       Education: Flexibility, Balance, Mind/Body Relaxation: - Group verbal and visual presentation with interactive activity on the components of exercise prescription. Introduces F.I.T.T principle from ACSM for exercise prescriptions. Reviews F.I.T.T. principles of  flexibility and balance exercise training including progression. Also discusses the mind body connection.  Reviews various relaxation techniques to help reduce and manage stress (i.e. Deep breathing, progressive muscle relaxation, and visualization). Balance handout provided to take home. Written material given at graduation. Flowsheet Row Pulmonary Rehab from 08/23/2022 in [facility] Cardiac and Pulmonary Rehab  Date 07/12/22  Educator NT  Instruction Review Code 1- Verbalizes Understanding       Activity Barriers & Risk Stratification:  Activity Barriers & Cardiac Risk Stratification - 06/28/22 1414       Activity Barriers & Cardiac Risk Stratification   Activity Barriers Shortness of Breath             6 Minute Walk:  6 Minute Walk     Row Name 07/10/22 1624         6 Minute Walk   Phase Initial     Distance 640 feet     Walk Time 5.25 minutes     # of Rest Breaks 3     MPH 1.38     METS 2.72     RPE 13     Perceived Dyspnea  3     VO2 Peak 9.51     Symptoms Yes (comment)     Comments slight lightheadedness     Resting HR 92 bpm     Resting BP 142/80     Resting Oxygen Saturation  96 %     Exercise Oxygen Saturation  during 6 min walk 88 %     Max Ex. HR 117 bpm     Max Ex. BP 188/82     2 Minute Post BP 142/80       Interval HR   1 Minute HR 100     2 Minute HR 104     3 Minute HR 110     4 Minute HR 100     5 Minute HR 106     6 Minute HR 117     Interval Heart Rate? Yes  Interval Oxygen   Interval Oxygen? Yes     Baseline Oxygen Saturation % 96 %     1 Minute Oxygen Saturation % 93 %     1 Minute Liters of Oxygen 3 L     2 Minute Oxygen Saturation % 92 %     2 Minute Liters of Oxygen 3 L     3 Minute Oxygen Saturation % 91 %     3 Minute Liters of Oxygen 3 L     4 Minute Oxygen Saturation % 90 %     4 Minute Liters of Oxygen 3 L     5 Minute Oxygen Saturation % 88 %     5 Minute Liters of Oxygen 3 L     6 Minute Oxygen Saturation % 92 %      6 Minute Liters of Oxygen 3 L     2 Minute Post Oxygen Saturation % 94 %     2 Minute Post Liters of Oxygen 3 L             Oxygen Initial Assessment:  Oxygen Initial Assessment - 07/10/22 1643       Home Oxygen   Home Oxygen Device Home Concentrator;Portable Concentrator    Sleep Oxygen Prescription BiPAP;Continuous    Liters per minute 3    Home Exercise Oxygen Prescription Continuous    Liters per minute 3    Home Resting Oxygen Prescription Continuous    Liters per minute 3    Compliance with Home Oxygen Use Yes      Initial 6 min Walk   Oxygen Used Continuous    Liters per minute 3      Program Oxygen Prescription   Program Oxygen Prescription E-Tanks;Continuous    Liters per minute 3      Intervention   Short Term Goals To learn and exhibit compliance with exercise, home and travel O2 prescription;To learn and understand importance of monitoring SPO2 with pulse oximeter and demonstrate accurate use of the pulse oximeter.;To learn and understand importance of maintaining oxygen saturations>88%;To learn and demonstrate proper pursed lip breathing techniques or other breathing techniques. ;To learn and demonstrate proper use of respiratory medications    Long  Term Goals Exhibits compliance with exercise, home  and travel O2 prescription;Verbalizes importance of monitoring SPO2 with pulse oximeter and return demonstration;Maintenance of O2 saturations>88%;Exhibits proper breathing techniques, such as pursed lip breathing or other method taught during program session;Compliance with respiratory medication;Demonstrates proper use of MDI's             Oxygen Re-Evaluation:  Oxygen Re-Evaluation     Row Name 07/12/22 1408 08/10/22 1356 08/14/22 1352         Program Oxygen Prescription   Program Oxygen Prescription -- E-Tanks;Continuous E-Tanks;Continuous     Liters per minute -- 3 3       Home Oxygen   Home Oxygen Device -- Home Concentrator;Portable Concentrator  Home Concentrator;Portable Concentrator     Sleep Oxygen Prescription -- BiPAP;Continuous BiPAP;Continuous     Liters per minute -- 3 3     Home Exercise Oxygen Prescription -- Continuous Continuous     Liters per minute -- 3 3     Home Resting Oxygen Prescription -- Continuous Continuous     Liters per minute -- 3 3     Compliance with Home Oxygen Use -- Yes Yes       Goals/Expected Outcomes   Short Term Goals To learn and exhibit compliance  with exercise, home and travel O2 prescription;To learn and understand importance of monitoring SPO2 with pulse oximeter and demonstrate accurate use of the pulse oximeter.;To learn and understand importance of maintaining oxygen saturations>88%;To learn and demonstrate proper pursed lip breathing techniques or other breathing techniques. ;To learn and demonstrate proper use of respiratory medications To learn and exhibit compliance with exercise, home and travel O2 prescription;To learn and understand importance of monitoring SPO2 with pulse oximeter and demonstrate accurate use of the pulse oximeter.;To learn and understand importance of maintaining oxygen saturations>88%;To learn and demonstrate proper pursed lip breathing techniques or other breathing techniques. ;To learn and demonstrate proper use of respiratory medications To learn and demonstrate proper use of respiratory medications;To learn and exhibit compliance with exercise, home and travel O2 prescription     Long  Term Goals Exhibits compliance with exercise, home  and travel O2 prescription;Verbalizes importance of monitoring SPO2 with pulse oximeter and return demonstration;Maintenance of O2 saturations>88%;Exhibits proper breathing techniques, such as pursed lip breathing or other method taught during program session;Compliance with respiratory medication;Demonstrates proper use of MDI's Exhibits compliance with exercise, home  and travel O2 prescription;Verbalizes importance of monitoring SPO2 with  pulse oximeter and return demonstration;Maintenance of O2 saturations>88%;Exhibits proper breathing techniques, such as pursed lip breathing or other method taught during program session;Compliance with respiratory medication;Demonstrates proper use of MDI's Demonstrates proper use of MDI's;Exhibits compliance with exercise, home  and travel O2 prescription     Comments Reviewed PLB technique with pt.  Talked about how it works and it's importance in maintaining their exercise saturations. Jenny Reichmann is doing well in rehab.  She is doing better with her breathing and continues to work towards transplant.  She continues to work on PLB.  She is watching her saturations and exposures.  She conitnues to use her oxygen consistently. Tiyonna is going through the requirements to get on a lung transplant list. She is changing her medications up and is following her pulmonologist.     Goals/Expected Outcomes Short: Become more profiecient at using PLB.   Long: Become independent at using PLB. Short: Continue to use PLB  Long: Continue to monitor saturations. Short: continue working toward Lung Transplant list. Long: Receive a lung transplant.              Oxygen Discharge (Final Oxygen Re-Evaluation):  Oxygen Re-Evaluation - 08/14/22 1352       Program Oxygen Prescription   Program Oxygen Prescription E-Tanks;Continuous    Liters per minute 3      Home Oxygen   Home Oxygen Device Home Concentrator;Portable Concentrator    Sleep Oxygen Prescription BiPAP;Continuous    Liters per minute 3    Home Exercise Oxygen Prescription Continuous    Liters per minute 3    Home Resting Oxygen Prescription Continuous    Liters per minute 3    Compliance with Home Oxygen Use Yes      Goals/Expected Outcomes   Short Term Goals To learn and demonstrate proper use of respiratory medications;To learn and exhibit compliance with exercise, home and travel O2 prescription    Long  Term Goals Demonstrates proper use of  MDI's;Exhibits compliance with exercise, home  and travel O2 prescription    Comments Kaniyah is going through the requirements to get on a lung transplant list. She is changing her medications up and is following her pulmonologist.    Goals/Expected Outcomes Short: continue working toward Lung Transplant list. Long: Receive a lung transplant.  Initial Exercise Prescription:  Initial Exercise Prescription - 07/10/22 1600       Date of Initial Exercise RX and Referring Provider   Date 07/10/22    Referring Provider Aleskerov      Oxygen   Oxygen Continuous    Liters 3    Maintain Oxygen Saturation 88% or higher      Treadmill   MPH 1    Grade 0    Minutes 15    METs 1.8      Recumbant Bike   Level 1    RPM 50    Minutes 15    METs 2.72      NuStep   Level 1    SPM 80    Minutes 15    METs 2.72      REL-XR   Level 1    Speed 50    Minutes 15    METs 2.72      T5 Nustep   Level 1    SPM 60    Minutes 15    METs 2.72      Biostep-RELP   Level 1    SPM 50    Minutes 15    METs 2.72      Track   Laps 15    Minutes 15    METs 1.82      Prescription Details   Frequency (times per week) 3    Duration Progress to 30 minutes of continuous aerobic without signs/symptoms of physical distress      Intensity   THRR 40-80% of Max Heartrate 116-141    Ratings of Perceived Exertion 11-13    Perceived Dyspnea 0-4      Progression   Progression Continue to progress workloads to maintain intensity without signs/symptoms of physical distress.      Resistance Training   Training Prescription Yes    Weight 3    Reps 10-15             Perform Capillary Blood Glucose checks as needed.  Exercise Prescription Changes:   Exercise Prescription Changes     Row Name 07/10/22 1600 07/31/22 1000 08/15/22 1500 08/29/22 1500       Response to Exercise   Blood Pressure (Admit) 142/80 118/62 122/66 130/68    Blood Pressure (Exercise) 188/82  168/70 162/88 --    Blood Pressure (Exit) 142/80 134/72 112/66 138/72    Heart Rate (Admit) 92 bpm 95 bpm 99 bpm 100 bpm    Heart Rate (Exercise) 117 bpm 118 bpm 117 bpm 113 bpm    Heart Rate (Exit) 107 bpm 105 bpm 113 bpm 107 bpm    Oxygen Saturation (Admit) 96 % 98 % 93 % 99 %    Oxygen Saturation (Exercise) 88 % 90 % 90 % 91 %    Oxygen Saturation (Exit) 94 % 96 % 95 % 95 %    Rating of Perceived Exertion (Exercise) _0 Perceived Dyspnea (Exercise) _1 Symptoms slight lightheadedness SOB SOB SOB    Comments 6 MWT results 2nd full week of exercise -- --    Duration -- Progress to 30 minutes of  aerobic without signs/symptoms of physical distress Continue with 30 min of aerobic exercise without signs/symptoms of physical distress. Continue with 30 min of aerobic exercise without signs/symptoms of physical distress.    Intensity -- THRR unchanged THRR unchanged THRR unchanged      Progression  Progression -- Continue to progress workloads to maintain intensity without signs/symptoms of physical distress. Continue to progress workloads to maintain intensity without signs/symptoms of physical distress. Continue to progress workloads to maintain intensity without signs/symptoms of physical distress.    Average METs -- 2.18 2.32 2.22      Resistance Training   Training Prescription -- Yes Yes Yes    Weight -- 3 lb 3 lb 3 lb    Reps -- 10-15 10-15 10-15      Interval Training   Interval Training -- No No No      Oxygen   Oxygen -- Continuous Continuous Continuous    Liters -- 2-_0 Treadmill   MPH -- 1.3 1.3 --    Grade -- 0 0 --    Minutes -- 15 15 --    METs -- 2 2 --      Recumbant Bike   Level -- _1 Minutes -- _2 METs -- -- 2.73 --      NuStep   Level -- 2 -- --    Minutes -- 15 -- --    METs -- 2 -- --      REL-XR   Level -- _3 Minutes -- _4 METs -- 3 3.1 3      T5 Nustep   Level -- -- 1 2    Minutes -- --  15 30    METs -- -- 2 1.9      Oxygen   Maintain Oxygen Saturation -- 88% or higher 88% or higher 88% or higher             Exercise Comments:   Exercise Goals and Review:   Exercise Goals     Row Name 07/10/22 1638             Exercise Goals   Increase Physical Activity Yes       Intervention Provide advice, education, support and counseling about physical activity/exercise needs.;Develop an individualized exercise prescription for aerobic and resistive training based on initial evaluation findings, risk stratification, comorbidities and participant's personal goals.       Expected Outcomes Short Term: Attend rehab on a regular basis to increase amount of physical activity.;Long Term: Add in home exercise to make exercise part of routine and to increase amount of physical activity.;Long Term: Exercising regularly at least 3-5 days a week.       Increase Strength and Stamina Yes       Intervention Provide advice, education, support and counseling about physical activity/exercise needs.;Develop an individualized exercise prescription for aerobic and resistive training based on initial evaluation findings, risk stratification, comorbidities and participant's personal goals.       Expected Outcomes Short Term: Increase workloads from initial exercise prescription for resistance, speed, and METs.;Short Term: Perform resistance training exercises routinely during rehab and add in resistance training at home;Long Term: Improve cardiorespiratory fitness, muscular endurance and strength as measured by increased METs and functional capacity (6MWT)       Able to understand and use rate of perceived exertion (RPE) scale Yes       Intervention Provide education and explanation on how to use RPE scale       Expected Outcomes Short Term: Able to use RPE daily in rehab to express subjective intensity level;Long Term:  Able to use RPE to guide  intensity level when exercising independently        Able to understand and use Dyspnea scale Yes       Intervention Provide education and explanation on how to use Dyspnea scale       Expected Outcomes Short Term: Able to use Dyspnea scale daily in rehab to express subjective sense of shortness of breath during exertion;Long Term: Able to use Dyspnea scale to guide intensity level when exercising independently       Knowledge and understanding of Target Heart Rate Range (THRR) Yes       Intervention Provide education and explanation of THRR including how the numbers were predicted and where they are located for reference       Expected Outcomes Short Term: Able to state/look up THRR;Short Term: Able to use daily as guideline for intensity in rehab;Long Term: Able to use THRR to govern intensity when exercising independently       Able to check pulse independently Yes       Intervention Provide education and demonstration on how to check pulse in carotid and radial arteries.;Review the importance of being able to check your own pulse for safety during independent exercise       Expected Outcomes Short Term: Able to explain why pulse checking is important during independent exercise;Long Term: Able to check pulse independently and accurately       Understanding of Exercise Prescription Yes       Intervention Provide education, explanation, and written materials on patient's individual exercise prescription       Expected Outcomes Short Term: Able to explain program exercise prescription;Long Term: Able to explain home exercise prescription to exercise independently                Exercise Goals Re-Evaluation :  Exercise Goals Re-Evaluation     Row Name 07/12/22 1407 07/31/22 1027 08/10/22 1345 08/15/22 1510 08/29/22 1554     Exercise Goal Re-Evaluation   Exercise Goals Review Increase Physical Activity;Increase Strength and Stamina;Able to understand and use rate of perceived exertion (RPE) scale;Able to understand and use Dyspnea  scale;Knowledge and understanding of Target Heart Rate Range (THRR);Able to check pulse independently;Understanding of Exercise Prescription Increase Physical Activity;Increase Strength and Stamina;Understanding of Exercise Prescription Increase Physical Activity;Increase Strength and Stamina;Understanding of Exercise Prescription Increase Physical Activity;Increase Strength and Stamina;Understanding of Exercise Prescription Increase Physical Activity;Increase Strength and Stamina;Understanding of Exercise Prescription   Comments Reviewed RPE and dyspnea scales, THR and program prescription with pt today.  Pt voiced understanding and was given a copy of goals to take home. Jenny Reichmann is doing well in rehab for the first couple of weeks she has been here. She has increased to level 2 on all of her seated machines and also is able to walk at 1.3 mph on the treadmill. Her oxygen saturations are staying well above 88% each time. We will continue to monitor as she is progressing in the program. Jenny Reichmann is doing well in rehab. She is using her Qubi and weights at home while she watches TV.  She is still working towards transplant.  She continues to build her strength up. Jenny Reichmann is doing well in rehab. She increased her overall average MET level to 2.32 METs. She also has tolerated a speed of 1.3 mph on the treadmill with no incline. She also increased to level 2 on the XR. We will continue to monitor his progress in the program. Jenny Reichmann continues to do well in rehab. She increased to level  3 on both the recumbent bike and XR and worked over 3 METS. She has stayed at a pretty consistent speed of 1.3 mph on the treadmill, however, she is challenged the most when walking. Staff will encourage a change in workload when appropriate. Will continue to monitor.   Expected Outcomes Short: Use RPE daily to regulate intensity. Long: Follow program prescription in THR. Short: Continue to increase workloads Long: Increase overall MET level  Short: Continue to improve exercise on off days Long: Continue to improve stamina Short: Continue to increase speed on treadmill and add incline. Long: Continue to improve strength and stamina. Short: Increase speed on treadmill Long: Continue to increase overall MET level            Discharge Exercise Prescription (Final Exercise Prescription Changes):  Exercise Prescription Changes - 08/29/22 1500       Response to Exercise   Blood Pressure (Admit) 130/68    Blood Pressure (Exit) 138/72    Heart Rate (Admit) 100 bpm    Heart Rate (Exercise) 113 bpm    Heart Rate (Exit) 107 bpm    Oxygen Saturation (Admit) 99 %    Oxygen Saturation (Exercise) 91 %    Oxygen Saturation (Exit) 95 %    Rating of Perceived Exertion (Exercise) 13    Perceived Dyspnea (Exercise) 3    Symptoms SOB    Duration Continue with 30 min of aerobic exercise without signs/symptoms of physical distress.    Intensity THRR unchanged      Progression   Progression Continue to progress workloads to maintain intensity without signs/symptoms of physical distress.    Average METs 2.22      Resistance Training   Training Prescription Yes    Weight 3 lb    Reps 10-15      Interval Training   Interval Training No      Oxygen   Oxygen Continuous    Liters 3      Recumbant Bike   Level 3    Minutes 19      REL-XR   Level 3    Minutes 15    METs 3      T5 Nustep   Level 2    Minutes 30    METs 1.9      Oxygen   Maintain Oxygen Saturation 88% or higher             Nutrition:  Target Goals: Understanding of nutrition guidelines, daily intake of sodium <1522m, cholesterol <2010m calories 30% from fat and 7% or less from saturated fats, daily to have 5 or more servings of fruits and vegetables.  Education: All About Nutrition: -Group instruction provided by verbal, written material, interactive activities, discussions, models, and posters to present general guidelines for heart healthy  nutrition including fat, fiber, MyPlate, the role of sodium in heart healthy nutrition, utilization of the nutrition label, and utilization of this knowledge for meal planning. Follow up email sent as well. Written material given at graduation. Flow[facility]  Date 07/26/22  [facility] Code 1- Verbalizes Understanding       Biometrics:  Pre Biometrics - 07/10/22 1639       Pre Biometrics   Height _0  (1.651 m)    Weight 151 lb 4.8 oz (68.6 kg)    Waist Circumference 43.5 inches    Hip Circumference 37 inches    Waist to Hip Ratio  1.18 %    BMI (Calculated) 25.18    Single Leg Stand 8.67 seconds              Nutrition Therapy Plan and Nutrition Goals:  Nutrition Therapy & Goals - 08/08/22 1305       Nutrition Therapy   Diet Heart healty, low Na, pulmonary MNT    Protein (specify units) 80-85g   1.2g/kg   Fiber 25 grams    Whole Grain Foods 3 servings    Saturated Fats 13 max. grams    Fruits and Vegetables 8 servings/day    Sodium 1.5 grams      Personal Nutrition Goals   Nutrition Goal ST: include at least 16 oz of water in between meals. add 2 snacks (mid-morning, mid-afternoon). include protein rich foods at meals and snacks: at least 1/2 protein shake with bagel breakfast, add nuts or peanut butter to greek yogurt, add beans/lentils to rice, add greek yogurt or cannellini beans to potato.  LT: meet protein/energy needs, vary diet to meet nutrient needs: vary proteins and eat a rainbow of fruits/vegetables    Comments 66 y.o. 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 PMHx includes anxiety and depression, GERD, 3L of O2. PSHx includes cholecystectomy. Relevant medications includes xanax, lexapro, remeron, prednisone, vit D3. Jenny Reichmann reports having 2 small meals per day;  B: yogurt with applesauce (greek yogurt with fruit in it) or sometimes bagel  (blueberry bagel with cream cheese and honey) or premier protein.  L/D: she likes chicken and she will bake or airfry food, she likes beans like pinto beans, she likes rice and potatoes (baked), she likes green leafy vegetables, mixed frozen vegetables, broccoli, bell peppers. She uses olive oil when she cooks in a reusable spray bottle, she rarely salts her food - she likes pepper. S: snack on trail mix after dinner. Drinks: small amounts of water water, diet soda (1-2, 16oz). Jenny Reichmann reports being told before to increase her water intake - encouraged her to include more water during her day ideal in-between meals and only sipping on drinks to help wash down food. Discussed elevated protein and calorie needs due to COPD. Recommended including 2 snacks (mid-morning, mid-afternoon) with protein, healthy fats, and fiber; boiled eggs with whole grain crackers, cottage cheese with berries, 1/2 PB sandwich on whole wheat bread, bean salad, fruit with nuts/seeds or peanut butter, protein shake. Include protein rich foods at meals and snacks: at least 1/2 protein shake with bagel breakfast, add nuts or peanut butter to greek yogurt, add beans/lentils to rice, add greek yogurt or cannellini beans to potato. Reviewed general healthy eating and MyPlate as well as pulmonary MNT. Jenny Reichmann reports working with [facility] for a lung transplant and she will see their RD in early November. Discussed how that RD may adjust protein or calorie amount and may have additional suggestions as they work with this population frequently.      Intervention Plan   Intervention Prescribe, educate and counsel regarding individualized specific dietary modifications aiming towards targeted core components such as weight, hypertension, lipid management, diabetes, heart failure and other comorbidities.    Expected Outcomes Short Term Goal: Understand basic principles of dietary content, such as calories, fat, sodium, cholesterol and nutrients.;Short Term  Goal: A plan has been developed with personal nutrition goals set during dietitian appointment.;Long Term Goal: Adherence to prescribed nutrition plan.             Nutrition Assessments:  MEDIFICTS Score  Key: ?70 Need to make dietary changes  40-70 Heart Healthy Diet ? 40 Therapeutic Level Cholesterol Diet  Flowsheet Row Pulmonary Rehab from 07/10/2022 in [facility] Cardiac and Pulmonary Rehab  Picture Your Plate Total Score on Admission 51      Picture Your Plate Scores: <44 Unhealthy dietary pattern with much room for improvement. 41-50 Dietary pattern unlikely to meet recommendations for good health and room for improvement. 51-60 More healthful dietary pattern, with some room for improvement.  >60 Healthy dietary pattern, although there may be some specific behaviors that could be improved.   Nutrition Goals Re-Evaluation:   Nutrition Goals Discharge (Final Nutrition Goals Re-Evaluation):   Psychosocial: Target Goals: Acknowledge presence or absence of significant depression and/or stress, maximize coping skills, provide positive support system. Participant is able to verbalize types and ability to use techniques and skills needed for reducing stress and depression.   Education: Stress, Anxiety, and Depression - Group verbal and visual presentation to define topics covered.  Reviews how body is impacted by stress, anxiety, and depression.  Also discusses healthy ways to reduce stress and to treat/manage anxiety and depression.  Written material given at graduation. Flowsheet Row Pulmonary Rehab from 08/23/2022 in [facility] Cardiac and Pulmonary Rehab  Date 08/23/22  Educator [place]  Instruction Review Code 1- [employer] Understanding       Education: Sleep Hygiene -Provides group verbal and written instruction about how sleep can affect your health.  Define sleep hygiene, discuss sleep cycles and impact of sleep habits. Review good sleep hygiene tips.    Initial Review &  Psychosocial Screening:  Initial Psych Review & Screening - 06/28/22 1417       Initial Review   Current issues with Current Depression;Current Psychotropic Meds;Current Stress Concerns      Family Dynamics   Good Support System? Yes   partner Laverna Peace, daughter  10 min away     Barriers   Psychosocial barriers to participate in program There are no identifiable barriers or psychosocial needs.      Screening Interventions   Interventions Encouraged to exercise;To provide support and resources with identified psychosocial needs;Provide feedback about the scores to participant    Expected Outcomes Short Term goal: Utilizing psychosocial counselor, staff and physician to assist with identification of specific Stressors or current issues interfering with healing process. Setting desired goal for each stressor or current issue identified.;Long Term Goal: Stressors or current issues are controlled or eliminated.;Short Term goal: Identification and review with participant of any Quality of Life or Depression concerns found by scoring the questionnaire.;Long Term goal: The participant improves quality of Life and PHQ9 Scores as seen by post scores and/or verbalization of changes             Quality of Life Scores:  Scores of 19 and below usually indicate a poorer quality of life in these areas.  A difference of  2-3 points is a clinically meaningful difference.  A difference of 2-3 points in the total score of the Quality of Life Index has been associated with significant improvement in overall quality of life, self-image, physical symptoms, and general health in studies assessing change in quality of life.  PHQ-9: Review Flowsheet       07/10/2022 08/16/2020 05/26/2016 02/15/2016  Depression screen PHQ 2/9  Decreased Interest 1 1 0 0  Down, Depressed, Hopeless _0 0  PHQ - 2 Score _1 0  Altered sleeping _2 Tired, decreased energy 1 1  1 1  Change in appetite 0 0 0 1  Feeling bad  or failure about yourself  0 0 0 0  Trouble concentrating 0 0 0 0  Moving slowly or fidgety/restless 0 0 0 0  Suicidal thoughts 0 0 0 0  PHQ-9 Score _0 Difficult doing work/chores Not difficult at all Somewhat difficult Somewhat difficult Not difficult at all   Interpretation of Total Score  Total Score Depression Severity:  1-4 = Minimal depression, 5-9 = Mild depression, 10-14 = Moderate depression, 15-19 = Moderately severe depression, 20-27 = Severe depression   Psychosocial Evaluation and Intervention:  Psychosocial Evaluation - 06/28/22 1424       Psychosocial Evaluation & Interventions   Comments Charnice has no barriers to attending the program. She lives with her partner,Jimmy. He ands her daughter are her support.  Peggyann is taking Lexapro for mild depression. She is doing well with the med. She is in the process of a Lung transplant workup. So far, all is going well. Her goal this admission is to get to the exercise levels required by the transplant team to qualify for the transplant. This is a double lung transplant.  She is ready to attend the program. Work on the exercise requirements and build some strength and stamina, especially in her core.    Expected Outcomes STG Katura attends all scheduled sessions, she works with the EP to progress her exercise to the level required by her [facility]plant team. LTG Jamelyn mets the requirements for her double lung transplant and she continues her exercise progression after her transplant.    Continue Psychosocial Services  Follow up required by staff             Psychosocial Re-Evaluation:  Psychosocial Re-Evaluation     [place] Name 08/10/22 1349             Psychosocial Re-Evaluation   Current issues with Current Anxiety/Panic;Current Stress Concerns       Comments Jenny Reichmann is doing well mentally.  She is down to one xanax a day and working her way down.  She cannot be on any for transplant.  She is working with her  doctor to find an alternative to help.  She is sleeping good and wears her BiPap most of the time until she wakes up.       Expected Outcomes Short: conitnue to work on backing off xanax Long: conitnue to improve sleep       Interventions Encouraged to attend Pulmonary Rehabilitation for the exercise;Stress management education       Continue Psychosocial Services  Follow up required by staff                Psychosocial Discharge (Final Psychosocial Re-Evaluation):  Psychosocial Re-Evaluation - 08/10/22 1349       Psychosocial Re-Evaluation   Current issues with Current Anxiety/Panic;Current Stress Concerns    Comments Jenny Reichmann is doing well mentally.  She is down to one xanax a day and working her way down.  She cannot be on any for transplant.  She is working with her doctor to find an alternative to help.  She is sleeping good and wears her BiPap most of the time until she wakes up.    Expected Outcomes Short: conitnue to work on backing off xanax Long: conitnue to improve sleep    Interventions Encouraged to attend Pulmonary Rehabilitation for the exercise;Stress management education    Continue Psychosocial Services  Follow up required  by staff             Education: Education Goals: Education classes will be provided on a weekly basis, covering required topics. Participant will state understanding/return demonstration of topics presented.  Learning Barriers/Preferences:   General Pulmonary Education Topics:  Infection Prevention: - Provides verbal and written material to individual with discussion of infection control including proper hand washing and proper equipment cleaning during exercise session. Flowsheet Row Pulmonary Rehab from 08/23/2022 in [facility] Cardiac and Pulmonary Rehab  Date 07/10/22  Educator [facility]  Instruction Review Code 1- Verbalizes Understanding       Falls Prevention: - Provides verbal and written material to individual with discussion of falls  prevention and safety. Flowsheet Row Pulmonary Rehab from 08/23/2022 in [facility] Cardiac and Pulmonary Rehab  Date 07/10/22  Educator [facility]  Instruction Review Code 1- Verbalizes Understanding       Chronic Lung Disease Review: - Group verbal instruction with posters, models, PowerPoint presentations and videos,  to review new updates, new respiratory medications, new advancements in procedures and treatments. Providing information on websites and "800" numbers for continued self-education. Includes information about supplement oxygen, available portable oxygen systems, continuous and intermittent flow rates, oxygen safety, concentrators, and Medicare reimbursement for oxygen. Explanation of Pulmonary Drugs, including class, frequency, complications, importance of spacers, rinsing mouth after steroid MDI's, and proper cleaning methods for nebulizers. Review of basic lung anatomy and physiology related to function, structure, and complications of lung disease. Review of risk factors. Discussion about methods for diagnosing sleep apnea and types of masks and machines for OSA. Includes a review of the use of types of environmental controls: home humidity, furnaces, filters, dust mite/pet prevention, HEPA vacuums. Discussion about weather changes, air quality and the benefits of nasal washing. Instruction on Warning signs, infection symptoms, calling MD promptly, preventive modes, and value of vaccinations. Review of effective airway clearance, coughing and/or vibration techniques. Emphasizing that all should Create an Action Plan. Written material given at graduation. Flowsheet Row Pulmonary Rehab from 10/13/2020 in [facility] Cardiac and Pulmonary Rehab  Date 09/29/20  Educator [facility]  Instruction Review Code 1- Verbalizes Understanding       AED/CPR: - Group verbal and written instruction with the use of models to demonstrate the basic use of the AED with the basic ABC's of resuscitation.    Anatomy and  Cardiac Procedures: - Group verbal and visual presentation and models provide information about basic cardiac anatomy and function. Reviews the testing methods done to diagnose heart disease and the outcomes of the test results. Describes the treatment choices: Medical Management, Angioplasty, or Coronary Bypass Surgery for treating various heart conditions including Myocardial Infarction, Angina, Valve Disease, and Cardiac Arrhythmias.  Written material given at graduation. Flowsheet Row Pulmonary Rehab from 08/23/2022 in [facility] Cardiac and Pulmonary Rehab  Date 07/19/22  Educator SB  Instruction Review Code 1- Verbalizes Understanding       Medication Safety: - Group verbal and visual instruction to review commonly prescribed medications for heart and lung disease. Reviews the medication, class of the drug, and side effects. Includes the steps to properly store meds and maintain the prescription regimen.  Written material given at graduation. Flowsheet Row Pulmonary Rehab from 10/13/2020 in [facility] Cardiac and Pulmonary Rehab  Date 09/15/20  Educator [facility]  Instruction Review Code 1- Verbalizes Understanding       Other: -Provides group and verbal instruction on various topics (see comments)   Knowledge Questionnaire Score:  Knowledge Questionnaire Score - 07/10/22 1642  Knowledge Questionnaire Score   Pre Score 18/18              Core Components/Risk Factors/Patient Goals at Admission:  Personal Goals and Risk Factors at Admission - 07/10/22 1640       Core Components/Risk Factors/Patient Goals on Admission    Weight Management Yes    Intervention Weight Management: Develop a combined nutrition and exercise program designed to reach desired caloric intake, while maintaining appropriate intake of nutrient and fiber, sodium and fats, and appropriate energy expenditure required for the weight goal.;Weight Management: Provide education and appropriate resources to help  participant work on and attain dietary goals.    Admit Weight 151 lb 4.8 oz (68.6 kg)    Goal Weight: Short Term 150 lb (68 kg)    Goal Weight: Long Term 150 lb (68 kg)    Expected Outcomes Short Term: Continue to assess and modify interventions until short term weight is achieved;Long Term: Adherence to nutrition and physical activity/exercise program aimed toward attainment of established weight goal;Weight Maintenance: Understanding of the daily nutrition guidelines, which includes 25-35% calories from fat, 7% or less cal from saturated fats, less than 219m cholesterol, less than 1.5gm of sodium, & 5 or more servings of fruits and vegetables daily;Understanding of distribution of calorie intake throughout the day with the consumption of 4-5 meals/snacks;Understanding recommendations for meals to include 15-35% energy as protein, 25-35% energy from fat, 35-60% energy from carbohydrates, less than 2058mof dietary cholesterol, 20-35 gm of total fiber daily    Improve shortness of breath with ADL's Yes    Intervention Provide education, individualized exercise plan and daily activity instruction to help decrease symptoms of SOB with activities of daily living.    Expected Outcomes Short Term: Improve cardiorespiratory fitness to achieve a reduction of symptoms when performing ADLs;Long Term: Be able to perform more ADLs without symptoms or delay the onset of symptoms    Increase knowledge of respiratory medications and ability to use respiratory devices properly  Yes    Intervention Provide education and demonstration as needed of appropriate use of medications, inhalers, and oxygen therapy.    Expected Outcomes Short Term: Achieves understanding of medications use. Understands that oxygen is a medication prescribed by physician. Demonstrates appropriate use of inhaler and oxygen therapy.;Long Term: Maintain appropriate use of medications, inhalers, and oxygen therapy.              Education:Diabetes - Individual verbal and written instruction to review signs/symptoms of diabetes, desired ranges of glucose level fasting, after meals and with exercise. Acknowledge that pre and post exercise glucose checks will be done for 3 sessions at entry of program.   Know Your Numbers and Heart Failure: - Group verbal and visual instruction to discuss disease risk factors for cardiac and pulmonary disease and treatment options.  Reviews associated critical values for Overweight/Obesity, Hypertension, Cholesterol, and Diabetes.  Discusses basics of heart failure: signs/symptoms and treatments.  Introduces Heart Failure Zone chart for action plan for heart failure.  Written material given at graduation.   Core Components/Risk Factors/Patient Goals Review:   Goals and Risk Factor Review     Row Name 08/10/22 1354             Core Components/Risk Factors/Patient Goals Review   Personal Goals Review Weight Management/Obesity;Improve shortness of breath with ADL's;Increase knowledge of respiratory medications and ability to use respiratory devices properly.       Review CyJahiras doing well in rehab. She is holding  steady on her weight and adding in protein.  She is working towards transplant. She is doing better with her breathing and able to do what she needs.  She continues to use inhalers as needed and uses her nebulizer twice a day.       Expected Outcomes Short: Conitnue to use nebulizer and maintain weight Long: Continue to work toward transplant                Core Components/Risk Factors/Patient Goals at Discharge (Final Review):   Goals and Risk Factor Review - 08/10/22 1354       Core Components/Risk Factors/Patient Goals Review   Personal Goals Review Weight Management/Obesity;Improve shortness of breath with ADL's;Increase knowledge of respiratory medications and ability to use respiratory devices properly.    Review Della is doing well in rehab. She is  holding steady on her weight and adding in protein.  She is working towards transplant. She is doing better with her breathing and able to do what she needs.  She continues to use inhalers as needed and uses her nebulizer twice a day.    Expected Outcomes Short: Conitnue to use nebulizer and maintain weight Long: Continue to work toward transplant             ITP Comments:  ITP Comments     Row Name 06/28/22 1424 07/10/22 1623 07/12/22 1406 08/02/22 0703 08/08/22 1429   ITP Comments Virtual orientation call completed today. shehas an appointment on Date: 07/10/2022  for EP eval and gym Orientation.  Documentation of diagnosis can be found in [facility] Date: 05/03/2022 . Completed 6MWT and gym orientation. Initial ITP created and sent for review to Dr. Zetta Bills, Medical Director. First full day of exercise!  Patient was oriented to gym and equipment including functions, settings, policies, and procedures.  Patient's individual exercise prescription and treatment plan were reviewed.  All starting workloads were established based on the results of the 6 minute walk test done at initial orientation visit.  The plan for exercise progression was also introduced and progression will be customized based on patient's performance and goals. 30 Day review completed. Medical Director ITP review done, changes made as directed, and signed approval by Medical Director.    New to program Completed initial RD consultation    Row Name 08/30/22 0919           ITP Comments 30 Day review completed. Medical Director ITP review done, changes made as directed, and signed approval by Medical Director.                Comments:

## 2022-08-31 ENCOUNTER — Encounter: Payer: Medicare Other | Admitting: *Deleted

## 2022-09-11 ENCOUNTER — Encounter: Payer: Medicare Other | Admitting: *Deleted

## 2022-09-20 ENCOUNTER — Encounter: Payer: Medicare Other | Admitting: *Deleted

## 2022-09-20 DIAGNOSIS — J449 Chronic obstructive pulmonary disease, unspecified: Secondary | ICD-10-CM | POA: Diagnosis not present

## 2022-09-20 NOTE — Progress Notes (Signed)
Daily Session Note  Patient Details  Name: KYNSLIE RINGLE MRN: [id number] Date of Birth: 11/24/1955 Referring Provider:   Flowsheet Row Pulmonary Rehab from 07/10/2022 in [facility]  Referring Provider [name]       Encounter Date: 09/20/2022  Check In:  Session Check In - 09/20/22 1334       Check-In   Supervising physician immediately available to respond to emergencies See telemetry face sheet for immediately available ER MD    Location [facility]    Staff Present Renita Papa, RN BSN;Laureen Owens Shark, BS, RRT, CPFT;Megan Tamala Julian, RN, ADN    Virtual Visit No    Medication changes reported     No    Comments off xanax, on buspar    Fall or balance concerns reported    No    Warm-up and Cool-down Performed on first and last piece of equipment    Resistance Training Performed Yes    VAD Patient? No    PAD/SET Patient? No      Pain Assessment   Currently in Pain? No/denies                Social History   Tobacco Use  Smoking Status Former   Packs/day: 1.00   Years: 30.00   Total pack years: 30.00   Types: Cigarettes   Quit date: 01/21/2012   Years since quitting: 10.6  Smokeless Tobacco Never    Goals Met:  Independence with exercise equipment Exercise tolerated well No report of concerns or symptoms today Strength training completed today  Goals Unmet:  Not Applicable  Comments: Pt able to follow exercise prescription today without complaint.  Will continue to monitor for progression.    Dr. Emily Filbert is Medical Director for [facility].  Dr. Ottie Glazier is Medical Director for [facility].

## 2022-09-27 ENCOUNTER — Encounter: Payer: Self-pay | Admitting: *Deleted

## 2022-09-27 ENCOUNTER — Encounter: Payer: Medicare Other | Admitting: *Deleted

## 2022-09-27 DIAGNOSIS — J449 Chronic obstructive pulmonary disease, unspecified: Secondary | ICD-10-CM

## 2022-09-27 NOTE — Progress Notes (Signed)
Pulmonary Individual Treatment Plan  Patient Details  Name: Heidi [name] MRN: [id number] Date of Birth: 03-08-1956 Referring Provider:   Flowsheet Row Pulmonary Rehab from 07/10/2022 in [facility] Cardiac and Pulmonary Rehab  Referring Provider Aleskerov       Initial Encounter Date:  Flowsheet Row Pulmonary Rehab from 07/10/2022 in [facility] Cardiac and Pulmonary Rehab  Date 07/10/22       Visit Diagnosis: Stage 4 very severe COPD by GOLD classification ([place])  Patient's Home Medications on Admission:  Current Outpatient Medications:    albuterol (PROVENTIL HFA;VENTOLIN HFA) 108 (90 Base) MCG/ACT inhaler, Inhale 1-2 puffs into the lungs every 4 (four) hours as needed for wheezing or shortness of breath., Disp: , Rfl:    albuterol (PROVENTIL) (2.5 MG/3ML) 0.083% nebulizer solution, Take 3 mLs (2.5 mg total) by nebulization every 6 (six) hours as needed for wheezing or shortness of breath., Disp: 360 mL, Rfl: 5   ALPRAZolam (XANAX) 0.25 MG tablet, Take 0.25 mg by mouth 2 (two) times daily., Disp: , Rfl:    azithromycin (ZITHROMAX) 250 MG tablet, Take by mouth., Disp: , Rfl:    DUPIXENT 300 MG/2ML SOPN, Inject into the skin., Disp: , Rfl:    EPINEPHrine 0.3 mg/0.3 mL IJ SOAJ injection, Inject into the muscle., Disp: , Rfl:    escitalopram (LEXAPRO) 20 MG tablet, Take 20 mg by mouth daily., Disp: , Rfl:    fluticasone (FLONASE) 50 MCG/ACT nasal spray, USE 2 SPRAYS IN EACH NOSTRIL ONCE DAILY, Disp: 16 g, Rfl: 5   guaiFENesin-codeine 100-10 MG/5ML syrup, Take 5 mLs by mouth every 6 (six) hours as needed for cough., Disp: 118 mL, Rfl: 0   Ipratropium-Albuterol (COMBIVENT RESPIMAT) 20-100 MCG/ACT AERS respimat, INHALE 1 PUFF BY MOUTH EVERY 6 HOURS AS NEEDED FOR WHEEZING, Disp: 4 g, Rfl: 5   ipratropium-albuterol (DUONEB) 0.5-2.5 (3) MG/3ML SOLN, Take 3 mLs by nebulization every 4 (four) hours as needed., Disp: 360 mL, Rfl: 2   mirtazapine (REMERON) 30 MG tablet, Take 30 mg by mouth at bedtime.,  Disp: , Rfl:    montelukast (SINGULAIR) 10 MG tablet, Take 10 mg by mouth at bedtime., Disp: , Rfl:    mupirocin ointment (BACTROBAN) 2 %, Apply 1 application topically daily. Apply to wound QD until healed. (Patient not taking: Reported on 06/28/2022), Disp: 22 g, Rfl: 0   predniSONE (DELTASONE) 5 MG tablet, Take 5 mg by mouth daily., Disp: , Rfl:    TRELEGY ELLIPTA 100-62.5-25 MCG/INH AEPB, INHALE 1 PUFF ONCE DAILY -  NEED  APPT  FOR  FURTHER  REFILLS, Disp: 60 each, Rfl: 1  Past Medical History: Past Medical History:  Diagnosis Date   Anxiety    COPD (chronic obstructive pulmonary disease) (HCC)    GERD (gastroesophageal reflux disease)    Hypotension    limiting med titration   NSTEMI (non-ST elevated myocardial infarction) ([place]) 2013   12/2011 with normal coronaries possibly secondary to Takotsubo (EF 30-35% by echo), occurred following two episodes of acute respiratory distress   Pneumonia 11/2016   history of   Takotsubo syndrome 4/13    Tobacco Use: Social History   Tobacco Use  Smoking Status Former   Packs/day: 1.00   Years: 30.00   Total pack years: 30.00   Types: Cigarettes   Quit date: 01/21/2012   Years since quitting: 10.6  Smokeless Tobacco Never    Labs: Review Flowsheet  More data exists      Latest Ref Rng & Units 02/01/2012 08/08/2012 04/04/2019  07/09/2020 11/05/2020  Labs for ITP Cardiac and Pulmonary Rehab  Hemoglobin A1c 4.8 - 5.6 % - - - - 5.8   PH, Arterial - 7.409  7.332  7.206  7.46  - -  PCO2 arterial mmHg 39.4  47.3  46.5  41  - -  Bicarbonate 20.0 - 28.0 mmol/L 24.4  25.1  18.4  29.2  31.6  31.9  -  TCO2 0 - 100 mmol/L 21._0 - - -  Acid-base deficit 0.0 - 2.0 mmol/L - 1.0  9.0  - - -  O2 Saturation % 94.1  99.0  100.0  97.5  92.4  94.0  -     Pulmonary Assessment Scores:  Pulmonary Assessment Scores     Row Name 07/10/22 1648         ADL UCSD   SOB Score total 87     Rest 1     Walk 4     Stairs 5     Bath 2     Dress 2      Shop 4       CAT Score   CAT Score 27       mMRC Score   mMRC Score 3              UCSD: Self-administered rating of dyspnea associated with activities of daily living (ADLs) 6-point scale (0 = "not at all" to 5 = "maximal or unable to do because of breathlessness")  Scoring Scores range from 0 to 120.  Minimally important difference is 5 units  CAT: CAT can identify the health impairment of COPD patients and is better correlated with disease progression.  CAT has a scoring range of zero to 40. The CAT score is classified into four groups of low (less than 10), medium (10 - 20), high (21-30) and very high (31-40) based on the impact level of disease on health status. A CAT score over 10 suggests significant symptoms.  A worsening CAT score could be explained by an exacerbation, poor medication adherence, poor inhaler technique, or progression of COPD or comorbid conditions.  CAT MCID is 2 points  mMRC: mMRC (Modified Medical Research Council) Dyspnea Scale is used to assess the degree of baseline functional disability in patients of respiratory disease due to dyspnea. No minimal important difference is established. A decrease in score of 1 point or greater is considered a positive change.   Pulmonary Function Assessment:  Pulmonary Function Assessment - 07/10/22 1643       Initial Spirometry Results   FVC% 56 %    FEV1% 26 %    FEV1/FVC Ratio 38      Post Bronchodilator Spirometry Results   FVC% 68 %    FEV1% 28 %    FEV1/FVC Ratio 34      Breath   Shortness of Breath Yes;Limiting activity             Exercise Target Goals: Exercise Program Goal: Individual exercise prescription set using results from initial 6 min walk test and THRR while considering  patient's activity barriers and safety.   Exercise Prescription Goal: Initial exercise prescription builds to 30-45 minutes a day of aerobic activity, 2-3 days per week.  Home exercise guidelines will be given to  patient during program as part of exercise prescription that the participant will acknowledge.  Education: Aerobic Exercise: - Group verbal and visual presentation on the components of exercise prescription. Introduces F.I.T.T principle from ACSM  for exercise prescriptions.  Reviews F.I.T.T. principles of aerobic exercise including progression. Written material given at graduation.   Education: Resistance Exercise: - Group verbal and visual presentation on the components of exercise prescription. Introduces F.I.T.T principle from ACSM for exercise prescriptions  Reviews F.I.T.T. principles of resistance exercise including progression. Written material given at graduation. Flowsheet Row Pulmonary Rehab from 09/20/2022 in [facility] Cardiac and Pulmonary Rehab  Date 09/20/22  Educator KW  Instruction Review Code 1- [employer] Understanding        Education: Exercise & Equipment Safety: - Individual verbal instruction and demonstration of equipment use and safety with use of the equipment. Flowsheet Row Pulmonary Rehab from 09/20/2022 in [facility] Cardiac and Pulmonary Rehab  Date 07/10/22  Educator [facility]  Instruction Review Code 1- Verbalizes Understanding       Education: Exercise Physiology & General Exercise Guidelines: - Group verbal and written instruction with models to review the exercise physiology of the cardiovascular system and associated critical values. Provides general exercise guidelines with specific guidelines to those with heart or lung disease.  Flowsheet Row Pulmonary Rehab from 10/13/2020 in [facility] Cardiac and Pulmonary Rehab  Date 10/13/20  Educator [facility]  Instruction Review Code 1- Verbalizes Understanding       Education: Flexibility, Balance, Mind/Body Relaxation: - Group verbal and visual presentation with interactive activity on the components of exercise prescription. Introduces F.I.T.T principle from ACSM for exercise prescriptions. Reviews F.I.T.T. principles of  flexibility and balance exercise training including progression. Also discusses the mind body connection.  Reviews various relaxation techniques to help reduce and manage stress (i.e. Deep breathing, progressive muscle relaxation, and visualization). Balance handout provided to take home. Written material given at graduation. Flowsheet Row Pulmonary Rehab from 09/20/2022 in [facility] Cardiac and Pulmonary Rehab  Date 09/20/22  Educator KW  Instruction Review Code 1- Verbalizes Understanding       Activity Barriers & Risk Stratification:  Activity Barriers & Cardiac Risk Stratification - 06/28/22 1414       Activity Barriers & Cardiac Risk Stratification   Activity Barriers Shortness of Breath             6 Minute Walk:  6 Minute Walk     Row Name 07/10/22 1624         6 Minute Walk   Phase Initial     Distance 640 feet     Walk Time 5.25 minutes     # of Rest Breaks 3     MPH 1.38     METS 2.72     RPE 13     Perceived Dyspnea  3     VO2 Peak 9.51     Symptoms Yes (comment)     Comments slight lightheadedness     Resting HR 92 bpm     Resting BP 142/80     Resting Oxygen Saturation  96 %     Exercise Oxygen Saturation  during 6 min walk 88 %     Max Ex. HR 117 bpm     Max Ex. BP 188/82     2 Minute Post BP 142/80       Interval HR   1 Minute HR 100     2 Minute HR 104     3 Minute HR 110     4 Minute HR 100     5 Minute HR 106     6 Minute HR 117     Interval Heart Rate? Yes  Interval Oxygen   Interval Oxygen? Yes     Baseline Oxygen Saturation % 96 %     1 Minute Oxygen Saturation % 93 %     1 Minute Liters of Oxygen 3 L     2 Minute Oxygen Saturation % 92 %     2 Minute Liters of Oxygen 3 L     3 Minute Oxygen Saturation % 91 %     3 Minute Liters of Oxygen 3 L     4 Minute Oxygen Saturation % 90 %     4 Minute Liters of Oxygen 3 L     5 Minute Oxygen Saturation % 88 %     5 Minute Liters of Oxygen 3 L     6 Minute Oxygen Saturation % 92 %      6 Minute Liters of Oxygen 3 L     2 Minute Post Oxygen Saturation % 94 %     2 Minute Post Liters of Oxygen 3 L             Oxygen Initial Assessment:  Oxygen Initial Assessment - 09/20/22 1404       Home Oxygen   Home Oxygen Device Home Concentrator;Portable Concentrator    Sleep Oxygen Prescription BiPAP;Continuous    Liters per minute 3    Home Exercise Oxygen Prescription Continuous    Liters per minute 3    Home Resting Oxygen Prescription Continuous    Liters per minute 3    Compliance with Home Oxygen Use Yes      Initial 6 min Walk   Oxygen Used Continuous    Liters per minute 3      Program Oxygen Prescription   Program Oxygen Prescription E-Tanks;Continuous    Liters per minute 3      Intervention   Short Term Goals To learn and demonstrate proper use of respiratory medications;To learn and exhibit compliance with exercise, home and travel O2 prescription    Long  Term Goals Demonstrates proper use of MDI's;Exhibits compliance with exercise, home  and travel O2 prescription             Oxygen Re-Evaluation:  Oxygen Re-Evaluation     Row Name 07/12/22 1408 08/10/22 1356 08/14/22 1352 09/20/22 1404       Program Oxygen Prescription   Program Oxygen Prescription -- E-Tanks;Continuous E-Tanks;Continuous --    Liters per minute -- 3 3 --      Home Oxygen   Home Oxygen Device -- Home Concentrator;Portable Concentrator Home Concentrator;Portable Concentrator --    Sleep Oxygen Prescription -- BiPAP;Continuous BiPAP;Continuous --    Liters per minute -- 3 3 --    Home Exercise Oxygen Prescription -- Continuous Continuous --    Liters per minute -- 3 3 --    Home Resting Oxygen Prescription -- Continuous Continuous --    Liters per minute -- 3 3 --    Compliance with Home Oxygen Use -- Yes Yes --      Goals/Expected Outcomes   Short Term Goals To learn and exhibit compliance with exercise, home and travel O2 prescription;To learn and understand  importance of monitoring SPO2 with pulse oximeter and demonstrate accurate use of the pulse oximeter.;To learn and understand importance of maintaining oxygen saturations>88%;To learn and demonstrate proper pursed lip breathing techniques or other breathing techniques. ;To learn and demonstrate proper use of respiratory medications To learn and exhibit compliance with exercise, home and travel O2 prescription;To learn and understand importance  of monitoring SPO2 with pulse oximeter and demonstrate accurate use of the pulse oximeter.;To learn and understand importance of maintaining oxygen saturations>88%;To learn and demonstrate proper pursed lip breathing techniques or other breathing techniques. ;To learn and demonstrate proper use of respiratory medications To learn and demonstrate proper use of respiratory medications;To learn and exhibit compliance with exercise, home and travel O2 prescription --    Long  Term Goals Exhibits compliance with exercise, home  and travel O2 prescription;Verbalizes importance of monitoring SPO2 with pulse oximeter and return demonstration;Maintenance of O2 saturations>88%;Exhibits proper breathing techniques, such as pursed lip breathing or other method taught during program session;Compliance with respiratory medication;Demonstrates proper use of MDI's Exhibits compliance with exercise, home  and travel O2 prescription;Verbalizes importance of monitoring SPO2 with pulse oximeter and return demonstration;Maintenance of O2 saturations>88%;Exhibits proper breathing techniques, such as pursed lip breathing or other method taught during program session;Compliance with respiratory medication;Demonstrates proper use of MDI's Demonstrates proper use of MDI's;Exhibits compliance with exercise, home  and travel O2 prescription --    Comments Reviewed PLB technique with pt.  Talked about how it works and it's importance in maintaining their exercise saturations. Jenny Reichmann is doing well in  rehab.  She is doing better with her breathing and continues to work towards transplant.  She continues to work on PLB.  She is watching her saturations and exposures.  She conitnues to use her oxygen consistently. Ngozi is going through the requirements to get on a lung transplant list. She is changing her medications up and is following her pulmonologist. Jenny Reichmann switched off Xanax and started Buspar and believes her breathing as been improved since! She just got worked up for testing for her lung transplant at [facility] and will find out if she is qualified in the next couple of weeks. She still watches her HR & O2 and home which stays above 88% and she continues to use PLB whenever she feels SOB.    Goals/Expected Outcomes Short: Become more profiecient at using PLB.   Long: Become independent at using PLB. Short: Continue to use PLB  Long: Continue to monitor saturations. Short: continue working toward Lung Transplant list. Long: Receive a lung transplant. Short: Continue to watch O2 and take medications appropriately Long: Lung transplant if approved!             Oxygen Discharge (Final Oxygen Re-Evaluation):  Oxygen Re-Evaluation - 09/20/22 1404       Goals/Expected Outcomes   Comments [name] switched off Xanax and started Buspar and believes her breathing as been improved since! She just got worked up for testing for her lung transplant at [facility] and will find out if she is qualified in the next couple of weeks. She still watches her HR & O2 and home which stays above 88% and she continues to use PLB whenever she feels SOB.    Goals/Expected Outcomes Short: Continue to watch O2 and take medications appropriately Long: Lung transplant if approved!             Initial Exercise Prescription:  Initial Exercise Prescription - 07/10/22 1600       Date of Initial Exercise RX and Referring Provider   Date 07/10/22    Referring Provider Aleskerov      Oxygen   Oxygen Continuous    Liters 3     Maintain Oxygen Saturation 88% or higher      Treadmill   MPH 1    Grade 0    Minutes 15  METs 1.8      Recumbant Bike   Level 1    RPM 50    Minutes 15    METs 2.72      NuStep   Level 1    SPM 80    Minutes 15    METs 2.72      REL-XR   Level 1    Speed 50    Minutes 15    METs 2.72      T5 Nustep   Level 1    SPM 60    Minutes 15    METs 2.72      Biostep-RELP   Level 1    SPM 50    Minutes 15    METs 2.72      Track   Laps 15    Minutes 15    METs 1.82      Prescription Details   Frequency (times per week) 3    Duration Progress to 30 minutes of continuous aerobic without signs/symptoms of physical distress      Intensity   THRR 40-80% of Max Heartrate 116-141    Ratings of Perceived Exertion 11-13    Perceived Dyspnea 0-4      Progression   Progression Continue to progress workloads to maintain intensity without signs/symptoms of physical distress.      Resistance Training   Training Prescription Yes    Weight 3    Reps 10-15             Perform Capillary Blood Glucose checks as needed.  Exercise Prescription Changes:   Exercise Prescription Changes     Row Name 07/10/22 1600 07/31/22 1000 08/15/22 1500 08/29/22 1500 09/25/22 1600     Response to Exercise   Blood Pressure (Admit) 142/80 118/62 122/66 130/68 134/62   Blood Pressure (Exercise) 188/82 168/70 162/88 -- --   Blood Pressure (Exit) 142/80 134/72 112/66 138/72 140/62   Heart Rate (Admit) 92 bpm 95 bpm 99 bpm 100 bpm 107 bpm   Heart Rate (Exercise) 117 bpm 118 bpm 117 bpm 113 bpm 123 bpm   Heart Rate (Exit) 107 bpm 105 bpm 113 bpm 107 bpm 100 bpm   Oxygen Saturation (Admit) 96 % 98 % 93 % 99 % 93 %   Oxygen Saturation (Exercise) 88 % 90 % 90 % 91 % 88 %   Oxygen Saturation (Exit) 94 % 96 % 95 % 95 % 92 %   Rating of Perceived Exertion (Exercise) _0 Perceived Dyspnea (Exercise) _1 Symptoms slight lightheadedness SOB SOB SOB SOB   Comments 6  MWT results 2nd full week of exercise -- -- --   Duration -- Progress to 30 minutes of  aerobic without signs/symptoms of physical distress Continue with 30 min of aerobic exercise without signs/symptoms of physical distress. Continue with 30 min of aerobic exercise without signs/symptoms of physical distress. Continue with 30 min of aerobic exercise without signs/symptoms of physical distress.   Intensity -- THRR unchanged THRR unchanged THRR unchanged THRR unchanged     Progression   Progression -- Continue to progress workloads to maintain intensity without signs/symptoms of physical distress. Continue to progress workloads to maintain intensity without signs/symptoms of physical distress. Continue to progress workloads to maintain intensity without signs/symptoms of physical distress. Continue to progress workloads to maintain intensity without signs/symptoms of physical distress.   Average METs -- 2.18 2.32 2.22 1.59  Resistance Training   Training Prescription -- Yes Yes Yes Yes   Weight -- 3 lb 3 lb 3 lb 3 lb   Reps -- 10-15 10-15 10-15 10-15     Interval Training   Interval Training -- No No No No     Oxygen   Oxygen -- Continuous Continuous Continuous Continuous   Liters -- 2-_0 Treadmill   MPH -- 1.3 1.3 -- 1.3   Grade -- 0 0 -- 0   Minutes -- 15 15 -- 15   METs -- 2 2 -- 2     Recumbant Bike   Level -- _1 --   Minutes -- _2 --   METs -- -- 2.73 -- --     NuStep   Level -- 2 -- -- --   Minutes -- 15 -- -- --   METs -- 2 -- -- --     REL-XR   Level -- _3 Minutes -- _4 METs -- 3 3.1 3 1.2     T5 Nustep   Level -- -- 1 2 --   Minutes -- -- 15 30 --   METs -- -- 2 1.9 --     Oxygen   Maintain Oxygen Saturation -- 88% or higher 88% or higher 88% or higher 88% or higher            Exercise Comments:   Exercise Goals and Review:   Exercise Goals     Row Name 07/10/22 1638             Exercise Goals    Increase Physical Activity Yes       Intervention Provide advice, education, support and counseling about physical activity/exercise needs.;Develop an individualized exercise prescription for aerobic and resistive training based on initial evaluation findings, risk stratification, comorbidities and participant's personal goals.       Expected Outcomes Short Term: Attend rehab on a regular basis to increase amount of physical activity.;Long Term: Add in home exercise to make exercise part of routine and to increase amount of physical activity.;Long Term: Exercising regularly at least 3-5 days a week.       Increase Strength and Stamina Yes       Intervention Provide advice, education, support and counseling about physical activity/exercise needs.;Develop an individualized exercise prescription for aerobic and resistive training based on initial evaluation findings, risk stratification, comorbidities and participant's personal goals.       Expected Outcomes Short Term: Increase workloads from initial exercise prescription for resistance, speed, and METs.;Short Term: Perform resistance training exercises routinely during rehab and add in resistance training at home;Long Term: Improve cardiorespiratory fitness, muscular endurance and strength as measured by increased METs and functional capacity (6MWT)       Able to understand and use rate of perceived exertion (RPE) scale Yes       Intervention Provide education and explanation on how to use RPE scale       Expected Outcomes Short Term: Able to use RPE daily in rehab to express subjective intensity level;Long Term:  Able to use RPE to guide intensity level when exercising independently       Able to understand and use Dyspnea scale Yes       Intervention Provide education and explanation on how to use Dyspnea scale       Expected Outcomes Short Term: Able to use  Dyspnea scale daily in rehab to express subjective sense of shortness of breath during  exertion;Long Term: Able to use Dyspnea scale to guide intensity level when exercising independently       Knowledge and understanding of Target Heart Rate Range (THRR) Yes       Intervention Provide education and explanation of THRR including how the numbers were predicted and where they are located for reference       Expected Outcomes Short Term: Able to state/look up THRR;Short Term: Able to use daily as guideline for intensity in rehab;Long Term: Able to use THRR to govern intensity when exercising independently       Able to check pulse independently Yes       Intervention Provide education and demonstration on how to check pulse in carotid and radial arteries.;Review the importance of being able to check your own pulse for safety during independent exercise       Expected Outcomes Short Term: Able to explain why pulse checking is important during independent exercise;Long Term: Able to check pulse independently and accurately       Understanding of Exercise Prescription Yes       Intervention Provide education, explanation, and written materials on patient's individual exercise prescription       Expected Outcomes Short Term: Able to explain program exercise prescription;Long Term: Able to explain home exercise prescription to exercise independently                Exercise Goals Re-Evaluation :  Exercise Goals Re-Evaluation     Row Name 07/12/22 1407 07/31/22 1027 08/10/22 1345 08/15/22 1510 08/29/22 1554     Exercise Goal Re-Evaluation   Exercise Goals Review Increase Physical Activity;Increase Strength and Stamina;Able to understand and use rate of perceived exertion (RPE) scale;Able to understand and use Dyspnea scale;Knowledge and understanding of Target Heart Rate Range (THRR);Able to check pulse independently;Understanding of Exercise Prescription Increase Physical Activity;Increase Strength and Stamina;Understanding of Exercise Prescription Increase Physical Activity;Increase  Strength and Stamina;Understanding of Exercise Prescription Increase Physical Activity;Increase Strength and Stamina;Understanding of Exercise Prescription Increase Physical Activity;Increase Strength and Stamina;Understanding of Exercise Prescription   Comments Reviewed RPE and dyspnea scales, THR and program prescription with pt today.  Pt voiced understanding and was given a copy of goals to take home. Jenny Reichmann is doing well in rehab for the first couple of weeks she has been here. She has increased to level 2 on all of her seated machines and also is able to walk at 1.3 mph on the treadmill. Her oxygen saturations are staying well above 88% each time. We will continue to monitor as she is progressing in the program. Jenny Reichmann is doing well in rehab. She is using her Qubi and weights at home while she watches TV.  She is still working towards transplant.  She continues to build her strength up. Jenny Reichmann is doing well in rehab. She increased her overall average MET level to 2.32 METs. She also has tolerated a speed of 1.3 mph on the treadmill with no incline. She also increased to level 2 on the XR. We will continue to monitor his progress in the program. Jenny Reichmann continues to do well in rehab. She increased to level 3 on both the recumbent bike and XR and worked over 3 METS. She has stayed at a pretty consistent speed of 1.3 mph on the treadmill, however, she is challenged the most when walking. Staff will encourage a change in workload when appropriate. Will continue to monitor.  Expected Outcomes Short: Use RPE daily to regulate intensity. Long: Follow program prescription in THR. Short: Continue to increase workloads Long: Increase overall MET level Short: Continue to improve exercise on off days Long: Continue to improve stamina Short: Continue to increase speed on treadmill and add incline. Long: Continue to improve strength and stamina. Short: Increase speed on treadmill Long: Continue to increase overall MET level     Row Name 09/13/22 1339 09/20/22 1350 09/25/22 1630         Exercise Goal Re-Evaluation   Exercise Goals Review Increase Physical Activity;Increase Strength and Stamina;Understanding of Exercise Prescription Increase Physical Activity;Increase Strength and Stamina;Understanding of Exercise Prescription Increase Physical Activity;Increase Strength and Stamina;Understanding of Exercise Prescription     Comments Jenny Reichmann has not attended rehab since the last two week evaluation. We will conitnue to monitor her progress in the program once she returns to rehab. Jenny Reichmann states she is doing exercises at home. She is pedaling on her recument leg ergometer- for about 20 minutes at a time while she watches TV. Encouraged to increase that slowly to reach 30 minutes at home. She is checking her HR and O2 and vitals are stable. I encouraged her to incorporate some walking at home as she nees to meet certain criteria for a 6MWT for her lung transplant. If she cannot do outside, she states she will plan to go to a grocery store to walk in a more climate control environment. She is also using 3lbs for handweights at home and is following a  senior exercise program on TV to do muscle training. Jenny Reichmann returned back from being out for a couple weeks as she was getting worked up for a lung transplant. She stayed consistent at 1.3 mph speed on the treadmill, she would benefit from increasing the speed or adding an incline. She continue back working at level 3 on XR. Will continue to monitor.     Expected Outcomes Short: Return to regular attendance. Long: Continue to increase overall MET level Short: Incorporate walking into home exercise Long: Continue to exercise independently Short: Increase speed and/or incline on treadmill Long: Continue to build up strength, stamina, endurance, and overall MET level to meet demands for lung transplant              Discharge Exercise Prescription (Final Exercise Prescription  Changes):  Exercise Prescription Changes - 09/25/22 1600       Response to Exercise   Blood Pressure (Admit) 134/62    Blood Pressure (Exit) 140/62    Heart Rate (Admit) 107 bpm    Heart Rate (Exercise) 123 bpm    Heart Rate (Exit) 100 bpm    Oxygen Saturation (Admit) 93 %    Oxygen Saturation (Exercise) 88 %    Oxygen Saturation (Exit) 92 %    Rating of Perceived Exertion (Exercise) 13    Perceived Dyspnea (Exercise) 2    Symptoms SOB    Duration Continue with 30 min of aerobic exercise without signs/symptoms of physical distress.    Intensity THRR unchanged      Progression   Progression Continue to progress workloads to maintain intensity without signs/symptoms of physical distress.    Average METs 1.59      Resistance Training   Training Prescription Yes    Weight 3 lb    Reps 10-15      Interval Training   Interval Training No      Oxygen   Oxygen Continuous    Liters 3  Treadmill   MPH 1.3    Grade 0    Minutes 15    METs 2      REL-XR   Level 3    Minutes 15    METs 1.2      Oxygen   Maintain Oxygen Saturation 88% or higher             Nutrition:  Target Goals: Understanding of nutrition guidelines, daily intake of sodium <1523m, cholesterol <2044m calories 30% from fat and 7% or less from saturated fats, daily to have 5 or more servings of fruits and vegetables.  Education: All About Nutrition: -Group instruction provided by verbal, written material, interactive activities, discussions, models, and posters to present general guidelines for heart healthy nutrition including fat, fiber, MyPlate, the role of sodium in heart healthy nutrition, utilization of the nutrition label, and utilization of this knowledge for meal planning. Follow up email sent as well. Written material given at graduation. [facility]  Date 07/26/22  [facility] Code 1- Verbalizes  Understanding       Biometrics:  Pre Biometrics - 07/10/22 1639       Pre Biometrics   Height _0  (1.651 m)    Weight 151 lb 4.8 oz (68.6 kg)    Waist Circumference 43.5 inches    Hip Circumference 37 inches    Waist to Hip Ratio 1.18 %    BMI (Calculated) 25.18    Single Leg Stand 8.67 seconds              Nutrition Therapy Plan and Nutrition Goals:  Nutrition Therapy & Goals - 08/08/22 1305       Nutrition Therapy   Diet Heart healty, low Na, pulmonary MNT    Protein (specify units) 80-85g   1.2g/kg   Fiber 25 grams    Whole Grain Foods 3 servings    Saturated Fats 13 max. grams    Fruits and Vegetables 8 servings/day    Sodium 1.5 grams      Personal Nutrition Goals   Nutrition Goal ST: include at least 16 oz of water in between meals. add 2 snacks (mid-morning, mid-afternoon). include protein rich foods at meals and snacks: at least 1/2 protein shake with bagel breakfast, add nuts or peanut butter to greek yogurt, add beans/lentils to rice, add greek yogurt or cannellini beans to potato.  LT: meet protein/energy needs, vary diet to meet nutrient needs: vary proteins and eat a rainbow of fruits/vegetables    Comments [id number]. 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 PMHx includes anxiety and depression, GERD, 3L of O2. PSHx includes cholecystectomy. Relevant medications includes xanax, lexapro, remeron, prednisone, vit D3. CiJenny Reichmanneports having 2 small meals per day;  B: yogurt with applesauce (greek yogurt with fruit in it) or sometimes bagel (blueberry bagel with cream cheese and honey) or premier protein.  L/D: she likes chicken and she will bake or airfry food, she likes beans like pinto beans, she likes rice and potatoes (baked), she likes green leafy vegetables, mixed frozen vegetables, broccoli, bell peppers. She uses olive oil when she cooks in a reusable spray bottle, she rarely salts her food - she likes pepper. S: snack on  trail mix after dinner. Drinks: small amounts of water water, diet soda (1-2, 16oz). CiJenny Reichmanneports being told before to increase her water intake - encouraged her to include more  water during her day ideal in-between meals and only sipping on drinks to help wash down food. Discussed elevated protein and calorie needs due to COPD. Recommended including 2 snacks (mid-morning, mid-afternoon) with protein, healthy fats, and fiber; boiled eggs with whole grain crackers, cottage cheese with berries, 1/2 PB sandwich on whole wheat bread, bean salad, fruit with nuts/seeds or peanut butter, protein shake. Include protein rich foods at meals and snacks: at least 1/2 protein shake with bagel breakfast, add nuts or peanut butter to greek yogurt, add beans/lentils to rice, add greek yogurt or cannellini beans to potato. Reviewed general healthy eating and MyPlate as well as pulmonary MNT. Jenny Reichmann reports working with [facility] for a lung transplant and she will see their RD in early November. Discussed how that RD may adjust protein or calorie amount and may have additional suggestions as they work with this population frequently.      Intervention Plan   Intervention Prescribe, educate and counsel regarding individualized specific dietary modifications aiming towards targeted core components such as weight, hypertension, lipid management, diabetes, heart failure and other comorbidities.    Expected Outcomes Short Term Goal: Understand basic principles of dietary content, such as calories, fat, sodium, cholesterol and nutrients.;Short Term Goal: A plan has been developed with personal nutrition goals set during dietitian appointment.;Long Term Goal: Adherence to prescribed nutrition plan.             Nutrition Assessments:  MEDIFICTS Score Key: ?70 Need to make dietary changes  40-70 Heart Healthy Diet ? 40 Therapeutic Level Cholesterol Diet  Flowsheet Row Pulmonary Rehab from 07/10/2022 in [facility] Cardiac and Pulmonary  Rehab  Picture Your Plate Total Score on Admission 51      Picture Your Plate Scores: <18 Unhealthy dietary pattern with much room for improvement. 41-50 Dietary pattern unlikely to meet recommendations for good health and room for improvement. 51-60 More healthful dietary pattern, with some room for improvement.  >60 Healthy dietary pattern, although there may be some specific behaviors that could be improved.   Nutrition Goals Re-Evaluation:  Nutrition Goals Re-Evaluation     [place] Name 09/20/22 1347             Goals   Nutrition Goal ST: include at least 16 oz of water in between meals. add 2 snacks (mid-morning, mid-afternoon). include protein rich foods at meals and snacks: at least 1/2 protein shake with bagel breakfast, add nuts or peanut butter to greek yogurt, add beans/lentils to rice, add greek yogurt or cannellini beans to potato.  LT: meet protein/energy needs, vary diet to meet nutrient needs: vary proteins and eat a rainbow of fruits/vegetables       Comment Jenny Reichmann has not drank up to 16 oz of water in between but has been trying to increase her intake. She knows how much she would be drinking. She has added in more fruit like oranges and apples as snacks, as well as real frozen fruit bars. She also had been eating more nuts as snack to help with her protein intake. Most of her protein sources are from pork, beef, and fish. She is  drinking Ensure almost daily, if not every other day to help with her protein as well. Encouraged to clarify how often she should be drinking them to help with overall pulmonary diet.       Expected Outcome Short: Continue to add in protein sources to diet, clarify how many ensure/week Long: Continue to meet protein needs and follow healthy Pulmonary based  diet                Nutrition Goals Discharge (Final Nutrition Goals Re-Evaluation):  Nutrition Goals Re-Evaluation - 09/20/22 1347       Goals   Nutrition Goal ST: include at least 16 oz  of water in between meals. add 2 snacks (mid-morning, mid-afternoon). include protein rich foods at meals and snacks: at least 1/2 protein shake with bagel breakfast, add nuts or peanut butter to greek yogurt, add beans/lentils to rice, add greek yogurt or cannellini beans to potato.  LT: meet protein/energy needs, vary diet to meet nutrient needs: vary proteins and eat a rainbow of fruits/vegetables    Comment Jenny Reichmann has not drank up to 16 oz of water in between but has been trying to increase her intake. She knows how much she would be drinking. She has added in more fruit like oranges and apples as snacks, as well as real frozen fruit bars. She also had been eating more nuts as snack to help with her protein intake. Most of her protein sources are from pork, beef, and fish. She is  drinking Ensure almost daily, if not every other day to help with her protein as well. Encouraged to clarify how often she should be drinking them to help with overall pulmonary diet.    Expected Outcome Short: Continue to add in protein sources to diet, clarify how many ensure/week Long: Continue to meet protein needs and follow healthy Pulmonary based diet             Psychosocial: Target Goals: Acknowledge presence or absence of significant depression and/or stress, maximize coping skills, provide positive support system. Participant is able to verbalize types and ability to use techniques and skills needed for reducing stress and depression.   Education: Stress, Anxiety, and Depression - Group verbal and visual presentation to define topics covered.  Reviews how body is impacted by stress, anxiety, and depression.  Also discusses healthy ways to reduce stress and to treat/manage anxiety and depression.  Written material given at graduation. Flowsheet Row Pulmonary Rehab from 09/20/2022 in [facility] Cardiac and Pulmonary Rehab  Date 08/23/22  Educator [place]  Instruction Review Code 1- Verbalizes Understanding        Education: Sleep Hygiene -Provides group verbal and written instruction about how sleep can affect your health.  Define sleep hygiene, discuss sleep cycles and impact of sleep habits. Review good sleep hygiene tips.    Initial Review & Psychosocial Screening:  Initial Psych Review & Screening - 06/28/22 1417       Initial Review   Current issues with Current Depression;Current Psychotropic Meds;Current Stress Concerns      Family Dynamics   Good Support System? Yes   partner Laverna Peace, daughter  10 min away     Barriers   Psychosocial barriers to participate in program There are no identifiable barriers or psychosocial needs.      Screening Interventions   Interventions Encouraged to exercise;To provide support and resources with identified psychosocial needs;Provide feedback about the scores to participant    Expected Outcomes Short Term goal: Utilizing psychosocial counselor, staff and physician to assist with identification of specific Stressors or current issues interfering with healing process. Setting desired goal for each stressor or current issue identified.;Long Term Goal: Stressors or current issues are controlled or eliminated.;Short Term goal: Identification and review with participant of any Quality of Life or Depression concerns found by scoring the questionnaire.;Long Term goal: The participant improves quality of Life  and PHQ9 Scores as seen by post scores and/or verbalization of changes             Quality of Life Scores:  Scores of 19 and below usually indicate a poorer quality of life in these areas.  A difference of  2-3 points is a clinically meaningful difference.  A difference of 2-3 points in the total score of the Quality of Life Index has been associated with significant improvement in overall quality of life, self-image, physical symptoms, and general health in studies assessing change in quality of life.  PHQ-9: Review Flowsheet       07/10/2022  08/16/2020 05/26/2016 02/15/2016  Depression screen PHQ 2/9  Decreased Interest 1 1 0 0  Down, Depressed, Hopeless _0 0  PHQ - 2 Score _1 0  Altered sleeping _2 Tired, decreased energy _3 Change in appetite 0 0 0 1  Feeling bad or failure about yourself  0 0 0 0  Trouble concentrating 0 0 0 0  Moving slowly or fidgety/restless 0 0 0 0  Suicidal thoughts 0 0 0 0  PHQ-9 Score _4 Difficult doing work/chores Not difficult at all Somewhat difficult Somewhat difficult Not difficult at all   Interpretation of Total Score  Total Score Depression Severity:  1-4 = Minimal depression, 5-9 = Mild depression, 10-14 = Moderate depression, 15-19 = Moderately severe depression, 20-27 = Severe depression   Psychosocial Evaluation and Intervention:  Psychosocial Evaluation - 06/28/22 1424       Psychosocial Evaluation & Interventions   Comments Mixtli has no barriers to attending the program. She lives with her partner,Jimmy. He ands her daughter are her support.  Aldeen is taking Lexapro for mild depression. She is doing well with the med. She is in the process of a Lung transplant workup. So far, all is going well. Her goal this admission is to get to the exercise levels required by the transplant team to qualify for the transplant. This is a double lung transplant.  She is ready to attend the program. Work on the exercise requirements and build some strength and stamina, especially in her core.    Expected Outcomes STG Zanita attends all scheduled sessions, she works with the EP to progress her exercise to the level required by her [facility] Transplant team. LTG Thora mets the requirements for her double lung transplant and she continues her exercise progression after her transplant.    Continue Psychosocial Services  Follow up required by staff             Psychosocial Re-Evaluation:  Psychosocial Re-Evaluation     [place] Name 08/10/22 1349 09/20/22 1344            Psychosocial Re-Evaluation   Current issues with Current Anxiety/Panic;Current Stress Concerns Current Anxiety/Panic;Current Stress Concerns      Comments Jenny Reichmann is doing well mentally.  She is down to one xanax a day and working her way down.  She cannot be on any for transplant.  She is working with her doctor to find an alternative to help.  She is sleeping good and wears her BiPap most of the time until she wakes up. [name] switched off of  Xanax on 11/10 and her doctor switched her to [place]. She has been feeling much better off of it  and even noted her breathing has improved as well. She is not allowed to be on Xanax when  getting worked up for a lung transplant. Her bleep is good, and is stil wearing  her Bipap.Jimmy who is her significant other and daughter are her biggest supporters. She is waiting to hear back from [facility] on whether or not she is qualified for a lung transplant.      Expected Outcomes Short: conitnue to work on backing off xanax Long: conitnue to improve sleep Short: Hear back on [facility] Long: Continue to utilize exercise for stress management and maintain posititve attitude      Interventions Encouraged to attend Pulmonary Rehabilitation for the exercise;Stress management education Encouraged to attend Pulmonary Rehabilitation for the exercise      Continue Psychosocial Services  Follow up required by staff Follow up required by staff               Psychosocial Discharge (Final Psychosocial Re-Evaluation):  Psychosocial Re-Evaluation - 09/20/22 1344       Psychosocial Re-Evaluation   Current issues with Current Anxiety/Panic;Current Stress Concerns    Comments Jenny Reichmann switched off of  Xanax on 11/10 and her doctor switched her to [place]. She has been feeling much better off of it  and even noted her breathing has improved as well. She is not allowed to be on Xanax when getting worked up for a lung transplant. Her bleep is good, and is stil wearing  her Bipap.Jimmy who is her  significant other and daughter are her biggest supporters. She is waiting to hear back from [facility] on whether or not she is qualified for a lung transplant.    Expected Outcomes Short: Hear back on [facility] Long: Continue to utilize exercise for stress management and maintain posititve attitude    Interventions Encouraged to attend Pulmonary Rehabilitation for the exercise    Continue Psychosocial Services  Follow up required by staff             Education: Education Goals: Education classes will be provided on a weekly basis, covering required topics. Participant will state understanding/return demonstration of topics presented.  Learning Barriers/Preferences:   General Pulmonary Education Topics:  Infection Prevention: - Provides verbal and written material to individual with discussion of infection control including proper hand washing and proper equipment cleaning during exercise session. Flowsheet Row Pulmonary Rehab from 09/20/2022 in [facility] Cardiac and Pulmonary Rehab  Date 07/10/22  Educator [facility]  Instruction Review Code 1- Verbalizes Understanding       Falls Prevention: - Provides verbal and written material to individual with discussion of falls prevention and safety. Flowsheet Row Pulmonary Rehab from 09/20/2022 in [facility] Cardiac and Pulmonary Rehab  Date 07/10/22  Educator [facility]  Instruction Review Code 1- Verbalizes Understanding       Chronic Lung Disease Review: - Group verbal instruction with posters, models, PowerPoint presentations and videos,  to review new updates, new respiratory medications, new advancements in procedures and treatments. Providing information on websites and "800" numbers for continued self-education. Includes information about supplement oxygen, available portable oxygen systems, continuous and intermittent flow rates, oxygen safety, concentrators, and Medicare reimbursement for oxygen. Explanation of Pulmonary Drugs, including class, frequency,  complications, importance of spacers, rinsing mouth after steroid MDI's, and proper cleaning methods for nebulizers. Review of basic lung anatomy and physiology related to function, structure, and complications of lung disease. Review of risk factors. Discussion about methods for diagnosing sleep apnea and types of masks and machines for OSA. Includes a review of the use of types of environmental controls: home humidity, furnaces, filters, dust mite/pet prevention, HEPA  vacuums. Discussion about weather changes, air quality and the benefits of nasal washing. Instruction on Warning signs, infection symptoms, calling MD promptly, preventive modes, and value of vaccinations. Review of effective airway clearance, coughing and/or vibration techniques. Emphasizing that all should Create an Action Plan. Written material given at graduation. Flowsheet Row Pulmonary Rehab from 10/13/2020 in [facility] Cardiac and Pulmonary Rehab  Date 09/29/20  Educator [facility]  Instruction Review Code 1- Verbalizes Understanding       AED/CPR: - Group verbal and written instruction with the use of models to demonstrate the basic use of the AED with the basic ABC's of resuscitation.    Anatomy and Cardiac Procedures: - Group verbal and visual presentation and models provide information about basic cardiac anatomy and function. Reviews the testing methods done to diagnose heart disease and the outcomes of the test results. Describes the treatment choices: Medical Management, Angioplasty, or Coronary Bypass Surgery for treating various heart conditions including Myocardial Infarction, Angina, Valve Disease, and Cardiac Arrhythmias.  Written material given at graduation. Flowsheet Row Pulmonary Rehab from 09/20/2022 in [facility] Cardiac and Pulmonary Rehab  Date 07/19/22  Educator [place]  Instruction Review Code 1- Verbalizes Understanding       Medication Safety: - Group verbal and visual instruction to review commonly prescribed  medications for heart and lung disease. Reviews the medication, class of the drug, and side effects. Includes the steps to properly store meds and maintain the prescription regimen.  Written material given at graduation. Flowsheet Row Pulmonary Rehab from 10/13/2020 in [facility] Cardiac and Pulmonary Rehab  Date 09/15/20  Educator [facility]  Instruction Review Code 1- Verbalizes Understanding       Other: -Provides group and verbal instruction on various topics (see comments)   Knowledge Questionnaire Score:  Knowledge Questionnaire Score - 07/10/22 1642       Knowledge Questionnaire Score   Pre Score 18/18              Core Components/Risk Factors/Patient Goals at Admission:  Personal Goals and Risk Factors at Admission - 07/10/22 1640       Core Components/Risk Factors/Patient Goals on Admission    Weight Management Yes    Intervention Weight Management: Develop a combined nutrition and exercise program designed to reach desired caloric intake, while maintaining appropriate intake of nutrient and fiber, sodium and fats, and appropriate energy expenditure required for the weight goal.;Weight Management: Provide education and appropriate resources to help participant work on and attain dietary goals.    Admit Weight 151 lb 4.8 oz (68.6 kg)    Goal Weight: Short Term 150 lb (68 kg)    Goal Weight: Long Term 150 lb (68 kg)    Expected Outcomes Short Term: Continue to assess and modify interventions until short term weight is achieved;Long Term: Adherence to nutrition and physical activity/exercise program aimed toward attainment of established weight goal;Weight Maintenance: Understanding of the daily nutrition guidelines, which includes 25-35% calories from fat, 7% or less cal from saturated fats, less than 239m cholesterol, less than 1.5gm of sodium, & 5 or more servings of fruits and vegetables daily;Understanding of distribution of calorie intake throughout the day with the consumption of  4-5 meals/snacks;Understanding recommendations for meals to include 15-35% energy as protein, 25-35% energy from fat, 35-60% energy from carbohydrates, less than 2017mof dietary cholesterol, 20-35 gm of total fiber daily    Improve shortness of breath with ADL's Yes    Intervention Provide education, individualized exercise plan and daily activity instruction to help  decrease symptoms of SOB with activities of daily living.    Expected Outcomes Short Term: Improve cardiorespiratory fitness to achieve a reduction of symptoms when performing ADLs;Long Term: Be able to perform more ADLs without symptoms or delay the onset of symptoms    Increase knowledge of respiratory medications and ability to use respiratory devices properly  Yes    Intervention Provide education and demonstration as needed of appropriate use of medications, inhalers, and oxygen therapy.    Expected Outcomes Short Term: Achieves understanding of medications use. Understands that oxygen is a medication prescribed by physician. Demonstrates appropriate use of inhaler and oxygen therapy.;Long Term: Maintain appropriate use of medications, inhalers, and oxygen therapy.             Education:Diabetes - Individual verbal and written instruction to review signs/symptoms of diabetes, desired ranges of glucose level fasting, after meals and with exercise. Acknowledge that pre and post exercise glucose checks will be done for 3 sessions at entry of program.   Know Your Numbers and Heart Failure: - Group verbal and visual instruction to discuss disease risk factors for cardiac and pulmonary disease and treatment options.  Reviews associated critical values for Overweight/Obesity, Hypertension, Cholesterol, and Diabetes.  Discusses basics of heart failure: signs/symptoms and treatments.  Introduces Heart Failure Zone chart for action plan for heart failure.  Written material given at graduation.   Core Components/Risk Factors/Patient  Goals Review:   Goals and Risk Factor Review     Row Name 08/10/22 1354 09/20/22 1343           Core Components/Risk Factors/Patient Goals Review   Personal Goals Review Weight Management/Obesity;Improve shortness of breath with ADL's;Increase knowledge of respiratory medications and ability to use respiratory devices properly. Weight Management/Obesity;Improve shortness of breath with ADL's;Increase knowledge of respiratory medications and ability to use respiratory devices properly.      Review Alaze is doing well in rehab. She is holding steady on her weight and adding in protein.  She is working towards transplant. She is doing better with her breathing and able to do what she needs.  She continues to use inhalers as needed and uses her nebulizer twice a day. Jenny Reichmann reports that her breathing has actually improved after being removed off xanax. She cannot be on it if she ends up getting her lung transplant. She is continuing her breathing treatments and respiratory medications appropriately. She realized she lost a couple pounds down to 147 lb, she was 150 lb before. She wants to lose weight but also talked about making sure she is not losing her muscle as she needs to stay as strong as she can for her possible surgery. Patient weighs herself at home almost daily and keeps aware. She knows to report any abnormal value.      Expected Outcomes Short: Conitnue to use nebulizer and maintain weight Long: Continue to work toward transplant Short: Continue to keep a good eye on the weight Long: Continue to manage lifestyle risk factors               Core Components/Risk Factors/Patient Goals at Discharge (Final Review):   Goals and Risk Factor Review - 09/20/22 1343       Core Components/Risk Factors/Patient Goals Review   Personal Goals Review Weight Management/Obesity;Improve shortness of breath with ADL's;Increase knowledge of respiratory medications and ability to use respiratory devices  properly.    Review Jenny Reichmann reports that her breathing has actually improved after being removed off xanax. She cannot be  on it if she ends up getting her lung transplant. She is continuing her breathing treatments and respiratory medications appropriately. She realized she lost a couple pounds down to 147 lb, she was 150 lb before. She wants to lose weight but also talked about making sure she is not losing her muscle as she needs to stay as strong as she can for her possible surgery. Patient weighs herself at home almost daily and keeps aware. She knows to report any abnormal value.    Expected Outcomes Short: Continue to keep a good eye on the weight Long: Continue to manage lifestyle risk factors             ITP Comments:  ITP Comments     Row Name 06/28/22 1424 07/10/22 1623 07/12/22 1406 08/02/22 0703 08/08/22 1429   ITP Comments Virtual orientation call completed today. shehas an appointment on Date: 07/10/2022  for EP eval and gym Orientation.  Documentation of diagnosis can be found in [facility] Date: 05/03/2022 . Completed 6MWT and gym orientation. Initial ITP created and sent for review to Dr. Zetta Bills, Medical Director. First full day of exercise!  Patient was oriented to gym and equipment including functions, settings, policies, and procedures.  Patient's individual exercise prescription and treatment plan were reviewed.  All starting workloads were established based on the results of the 6 minute walk test done at initial orientation visit.  The plan for exercise progression was also introduced and progression will be customized based on patient's performance and goals. 30 Day review completed. Medical Director ITP review done, changes made as directed, and signed approval by Medical Director.    New to program Completed initial RD consultation    Row Name 08/30/22 0919 09/27/22 0752         ITP Comments 30 Day review completed. Medical Director ITP review done, changes made as  directed, and signed approval by Medical Director. 30 Day review completed. Medical Director ITP review done, changes made as directed, and signed approval by Medical Director.               Comments:

## 2022-09-27 NOTE — Progress Notes (Signed)
Daily Session Note  Patient Details  Name: Heidi [name] MRN: [id number] Date of Birth: Mar 27, 1956 Referring Provider:   Flowsheet Row Pulmonary Rehab from 07/10/2022 in [facility] Re[facility]  Referring Provider [name]       Encounter Date: 09/27/2022  Check In:  Session Check In - 09/27/22 1346       Check-In   Supervising physician immediately available to respond to emergencies See telemetry face sheet for immediately available ER MD    Location [facility]-Cardiac & Pulmonary Rehab    Staff Present Renita Papa, RN Moises Blood, BS, ACSM CEP, Exercise Physiologist;Megan Tamala Julian, RN, ADN    Virtual Visit No    Medication changes reported     No    Fall or balance concerns reported    No    Warm-up and Cool-down Performed on first and last piece of equipment    Resistance Training Performed Yes    VAD Patient? No    PAD/SET Patient? No      Pain Assessment   Currently in Pain? No/denies                Social History   Tobacco Use  Smoking Status Former   Packs/day: 1.00   Years: 30.00   Total pack years: 30.00   Types: Cigarettes   Quit date: 01/21/2012   Years since quitting: 10.6  Smokeless Tobacco Never    Goals Met:  Independence with exercise equipment Exercise tolerated well No report of concerns or symptoms today Strength training completed today  Goals Unmet:  Not Applicable  Comments: Pt able to follow exercise prescription today without complaint.  Will continue to monitor for progression.    Dr. Emily Filbert is Medical Director for [facility].  Dr. Ottie Glazier is Medical Director for [facility] Pulmonary Rehabilitation.

## 2022-09-28 ENCOUNTER — Encounter: Payer: Medicare Other | Admitting: *Deleted

## 2022-09-28 DIAGNOSIS — J449 Chronic obstructive pulmonary disease, unspecified: Secondary | ICD-10-CM

## 2022-09-28 NOTE — Progress Notes (Signed)
Daily Session Note  Patient Details  Name: Heidi [name] MRN: [id number] Date of Birth: [birth year] Referring Provider:   Flowsheet Row Pulmonary Rehab from 07/10/2022 in [facility] Card[facility] Pulmonary Rehab  Referring Provider [name]       Encounter Date: 09/28/2022  Check In:  Session Check In - 09/28/22 1404       Check-In   Supervising physician immediately available to respond to emergencies See telemetry face sheet for immediately available ER MD    Location [facility]-Cardiac & Pulmonary Rehab    Staff Present Justin Mend, Lorre Nick, MA, RCEP, CCRP, CCET;Haniyyah Sakuma Sherryll Burger, RN BSN    Virtual Visit No    Medication changes reported     No    Fall or balance concerns reported    No    Warm-up and Cool-down Performed on first and last piece of equipment    Resistance Training Performed Yes    VAD Patient? No    PAD/SET Patient? No      Pain Assessment   Currently in Pain? No/denies                Social History   Tobacco Use  Smoking Status Former   Packs/day: 1.00   Years: 30.00   Total pack years: 30.00   Types: Cigarettes   Quit date: 01/21/2012   Years since quitting: 10.6  Smokeless Tobacco Never    Goals Met:  Independence with exercise equipment Exercise tolerated well No report of concerns or symptoms today Strength training completed today  Goals Unmet:  Not Applicable  Comments: Pt able to follow exercise prescription today without complaint.  Will continue to monitor for progression.    Dr. Emily Filbert is Medical Director for [facility].  Dr. Ottie Glazier is Medical Director for [facility] Pulmonary Rehabilitation.

## 2022-10-02 ENCOUNTER — Encounter: Payer: Medicare Other | Attending: Internal Medicine | Admitting: *Deleted

## 2022-10-02 DIAGNOSIS — J449 Chronic obstructive pulmonary disease, unspecified: Secondary | ICD-10-CM | POA: Insufficient documentation

## 2022-10-02 NOTE — Progress Notes (Signed)
Daily Session Note  Patient Details  Name: Heidi [name] MRN: [id number] Date of Birth: 09/10/1956 Referring Provider:   Flowsheet Row Pulmonary Rehab from 07/10/2022 in [facility] Cardiac and Pulmonary Rehab  Referring Provider Aleskerov       Encounter Date: 10/02/2022  Check In:  Session Check In - 10/02/22 1358       Check-In   Supervising physician immediately available to respond to emergencies See telemetry face sheet for immediately available ER MD    Location [facility]-Cardiac & Pulmonary Rehab    Staff Present Joseph Hood, RCP,RRT,BSRT;Jessica Hawkins, MA, RCEP, CCRP, CCET;Meredith Craven, RN BSN;Megan Smith, RN, ADN    Virtual Visit No    Medication changes reported     No    Fall or balance concerns reported    No    Warm-up and Cool-down Performed on first and last piece of equipment    Resistance Training Performed Yes    VAD Patient? No    PAD/SET Patient? No      Pain Assessment   Currently in Pain? No/denies                Social History   Tobacco Use  Smoking Status Former   Packs/day: 1.00   Years: 30.00   Total pack years: 30.00   Types: Cigarettes   Quit date: 01/21/2012   Years since quitting: 10.7  Smokeless Tobacco Never    Goals Met:  Independence with exercise equipment Exercise tolerated well No report of concerns or symptoms today Strength training completed today  Goals Unmet:  Not Applicable  Comments: Pt able to follow exercise prescription today without complaint.  Will continue to monitor for progression.    Dr. Mark Miller is Medical Director for [facility].  Dr. Fuad Aleskerov is Medical Director for [facility]. 

## 2022-10-10 ENCOUNTER — Telehealth: Payer: Self-pay

## 2022-10-10 NOTE — Telephone Encounter (Signed)
Left voicemail for patient regarding Pulmonary Rehab- has not attended since 12/4.

## 2022-10-25 ENCOUNTER — Encounter: Payer: Self-pay | Admitting: *Deleted

## 2022-10-25 DIAGNOSIS — J449 Chronic obstructive pulmonary disease, unspecified: Secondary | ICD-10-CM

## 2022-10-25 NOTE — Progress Notes (Signed)
Pulmonary Individual Treatment Plan  Patient Details  Name: Heidi [name] MRN: [id number] Date of Birth: August 31, 1956 Referring Provider:   Flowsheet Row Pulmonary Rehab from 07/10/2022 in [facility] Cardiac and Pulmonary Rehab  Referring Provider Aleskerov       Initial Encounter Date:  Flowsheet Row Pulmonary Rehab from 07/10/2022 in [facility] Cardiac and Pulmonary Rehab  Date 07/10/22       Visit Diagnosis: Stage 4 very severe COPD by GOLD classification ([place])  Patient's Home Medications on Admission:  Current Outpatient Medications:    albuterol (PROVENTIL HFA;VENTOLIN HFA) 108 (90 Base) MCG/ACT inhaler, Inhale 1-2 puffs into the lungs every 4 (four) hours as needed for wheezing or shortness of breath., Disp: , Rfl:    albuterol (PROVENTIL) (2.5 MG/3ML) 0.083% nebulizer solution, Take 3 mLs (2.5 mg total) by nebulization every 6 (six) hours as needed for wheezing or shortness of breath., Disp: 360 mL, Rfl: 5   ALPRAZolam (XANAX) 0.25 MG tablet, Take 0.25 mg by mouth 2 (two) times daily., Disp: , Rfl:    azithromycin (ZITHROMAX) 250 MG tablet, Take by mouth., Disp: , Rfl:    DUPIXENT 300 MG/2ML SOPN, Inject into the skin., Disp: , Rfl:    EPINEPHrine 0.3 mg/0.3 mL IJ SOAJ injection, Inject into the muscle., Disp: , Rfl:    escitalopram (LEXAPRO) 20 MG tablet, Take 20 mg by mouth daily., Disp: , Rfl:    fluticasone (FLONASE) 50 MCG/ACT nasal spray, USE 2 SPRAYS IN EACH NOSTRIL ONCE DAILY, Disp: 16 g, Rfl: 5   guaiFENesin-codeine 100-10 MG/5ML syrup, Take 5 mLs by mouth every 6 (six) hours as needed for cough., Disp: 118 mL, Rfl: 0   Ipratropium-Albuterol (COMBIVENT RESPIMAT) 20-100 MCG/ACT AERS respimat, INHALE 1 PUFF BY MOUTH EVERY 6 HOURS AS NEEDED FOR WHEEZING, Disp: 4 g, Rfl: 5   ipratropium-albuterol (DUONEB) 0.5-2.5 (3) MG/3ML SOLN, Take 3 mLs by nebulization every 4 (four) hours as needed., Disp: 360 mL, Rfl: 2   mirtazapine (REMERON) 30 MG tablet, Take 30 mg by mouth at bedtime.,  Disp: , Rfl:    montelukast (SINGULAIR) 10 MG tablet, Take 10 mg by mouth at bedtime., Disp: , Rfl:    mupirocin ointment (BACTROBAN) 2 %, Apply 1 application topically daily. Apply to wound QD until healed. (Patient not taking: Reported on 06/28/2022), Disp: 22 g, Rfl: 0   predniSONE (DELTASONE) 5 MG tablet, Take 5 mg by mouth daily., Disp: , Rfl:    TRELEGY ELLIPTA 100-62.5-25 MCG/INH AEPB, INHALE 1 PUFF ONCE DAILY -  NEED  APPT  FOR  FURTHER  REFILLS, Disp: 60 each, Rfl: 1  Past Medical History: Past Medical History:  Diagnosis Date   Anxiety    COPD (chronic obstructive pulmonary disease) (HCC)    GERD (gastroesophageal reflux disease)    Hypotension    limiting med titration   NSTEMI (non-ST elevated myocardial infarction) ([place]) 2013   12/2011 with normal coronaries possibly secondary to Takotsubo (EF 30-35% by echo), occurred following two episodes of acute respiratory distress   Pneumonia 11/2016   history of   Takotsubo syndrome 4/13    Tobacco Use: Social History   Tobacco Use  Smoking Status Former   Packs/day: 1.00   Years: 30.00   Total pack years: 30.00   Types: Cigarettes   Quit date: 01/21/2012   Years since quitting: 10.7  Smokeless Tobacco Never    Labs: Review Flowsheet  More data exists      Latest Ref Rng & Units 02/01/2012 08/08/2012 04/04/2019  07/09/2020 11/05/2020  Labs for ITP Cardiac and Pulmonary Rehab  Hemoglobin A1c 4.8 - 5.6 % - - - - 5.8   PH, Arterial - 7.409  7.332  7.206  7.46  - -  PCO2 arterial mmHg 39.4  47.3  46.5  41  - -  Bicarbonate 20.0 - 28.0 mmol/L 24.4  25.1  18.4  29.2  31.6  31.9  -  TCO2 0 - 100 mmol/L 21._0 - - -  Acid-base deficit 0.0 - 2.0 mmol/L - 1.0  9.0  - - -  O2 Saturation % 94.1  99.0  100.0  97.5  92.4  94.0  -     Pulmonary Assessment Scores:  Pulmonary Assessment Scores     Row Name 07/10/22 1648         ADL UCSD   SOB Score total 87     Rest 1     Walk 4     Stairs 5     Bath 2     Dress 2      Shop 4       CAT Score   CAT Score 27       mMRC Score   mMRC Score 3              UCSD: Self-administered rating of dyspnea associated with activities of daily living (ADLs) 6-point scale (0 = "not at all" to 5 = "maximal or unable to do because of breathlessness")  Scoring Scores range from 0 to 120.  Minimally important difference is 5 units  CAT: CAT can identify the health impairment of COPD patients and is better correlated with disease progression.  CAT has a scoring range of zero to 40. The CAT score is classified into four groups of low (less than 10), medium (10 - 20), high (21-30) and very high (31-40) based on the impact level of disease on health status. A CAT score over 10 suggests significant symptoms.  A worsening CAT score could be explained by an exacerbation, poor medication adherence, poor inhaler technique, or progression of COPD or comorbid conditions.  CAT MCID is 2 points  mMRC: mMRC (Modified Medical Research Council) Dyspnea Scale is used to assess the degree of baseline functional disability in patients of respiratory disease due to dyspnea. No minimal important difference is established. A decrease in score of 1 point or greater is considered a positive change.   Pulmonary Function Assessment:  Pulmonary Function Assessment - 07/10/22 1643       Initial Spirometry Results   FVC% 56 %    FEV1% 26 %    FEV1/FVC Ratio 38      Post Bronchodilator Spirometry Results   FVC% 68 %    FEV1% 28 %    FEV1/FVC Ratio 34      Breath   Shortness of Breath Yes;Limiting activity             Exercise Target Goals: Exercise Program Goal: Individual exercise prescription set using results from initial 6 min walk test and THRR while considering  patient's activity barriers and safety.   Exercise Prescription Goal: Initial exercise prescription builds to 30-45 minutes a day of aerobic activity, 2-3 days per week.  Home exercise guidelines will be given to  patient during program as part of exercise prescription that the participant will acknowledge.  Education: Aerobic Exercise: - Group verbal and visual presentation on the components of exercise prescription. Introduces F.I.T.T principle from ACSM  for exercise prescriptions.  Reviews F.I.T.T. principles of aerobic exercise including progression. Written material given at graduation.   Education: Resistance Exercise: - Group verbal and visual presentation on the components of exercise prescription. Introduces F.I.T.T principle from ACSM for exercise prescriptions  Reviews F.I.T.T. principles of resistance exercise including progression. Written material given at graduation. Flowsheet Row Pulmonary Rehab from 09/27/2022 in [facility] Cardiac and Pulmonary Rehab  Date 09/20/22  Educator KW  Instruction Review Code 1- [employer] Understanding        Education: Exercise & Equipment Safety: - Individual verbal instruction and demonstration of equipment use and safety with use of the equipment. Flowsheet Row Pulmonary Rehab from 09/27/2022 in [facility] Cardiac and Pulmonary Rehab  Date 07/10/22  Educator [facility]  Instruction Review Code 1- Verbalizes Understanding       Education: Exercise Physiology & General Exercise Guidelines: - Group verbal and written instruction with models to review the exercise physiology of the cardiovascular system and associated critical values. Provides general exercise guidelines with specific guidelines to those with heart or lung disease.  Flowsheet Row Pulmonary Rehab from 10/13/2020 in [facility] Cardiac and Pulmonary Rehab  Date 10/13/20  Educator [facility]  Instruction Review Code 1- Verbalizes Understanding       Education: Flexibility, Balance, Mind/Body Relaxation: - Group verbal and visual presentation with interactive activity on the components of exercise prescription. Introduces F.I.T.T principle from ACSM for exercise prescriptions. Reviews F.I.T.T. principles of  flexibility and balance exercise training including progression. Also discusses the mind body connection.  Reviews various relaxation techniques to help reduce and manage stress (i.e. Deep breathing, progressive muscle relaxation, and visualization). Balance handout provided to take home. Written material given at graduation. Flowsheet Row Pulmonary Rehab from 09/27/2022 in [facility] Cardiac and Pulmonary Rehab  Date 09/20/22  Educator KW  Instruction Review Code 1- Verbalizes Understanding       Activity Barriers & Risk Stratification:  Activity Barriers & Cardiac Risk Stratification - 06/28/22 1414       Activity Barriers & Cardiac Risk Stratification   Activity Barriers Shortness of Breath             6 Minute Walk:  6 Minute Walk     Row Name 07/10/22 1624         6 Minute Walk   Phase Initial     Distance 640 feet     Walk Time 5.25 minutes     # of Rest Breaks 3     MPH 1.38     METS 2.72     RPE 13     Perceived Dyspnea  3     VO2 Peak 9.51     Symptoms Yes (comment)     Comments slight lightheadedness     Resting HR 92 bpm     Resting BP 142/80     Resting Oxygen Saturation  96 %     Exercise Oxygen Saturation  during 6 min walk 88 %     Max Ex. HR 117 bpm     Max Ex. BP 188/82     2 Minute Post BP 142/80       Interval HR   1 Minute HR 100     2 Minute HR 104     3 Minute HR 110     4 Minute HR 100     5 Minute HR 106     6 Minute HR 117     Interval Heart Rate? Yes  Interval Oxygen   Interval Oxygen? Yes     Baseline Oxygen Saturation % 96 %     1 Minute Oxygen Saturation % 93 %     1 Minute Liters of Oxygen 3 L     2 Minute Oxygen Saturation % 92 %     2 Minute Liters of Oxygen 3 L     3 Minute Oxygen Saturation % 91 %     3 Minute Liters of Oxygen 3 L     4 Minute Oxygen Saturation % 90 %     4 Minute Liters of Oxygen 3 L     5 Minute Oxygen Saturation % 88 %     5 Minute Liters of Oxygen 3 L     6 Minute Oxygen Saturation % 92 %      6 Minute Liters of Oxygen 3 L     2 Minute Post Oxygen Saturation % 94 %     2 Minute Post Liters of Oxygen 3 L             Oxygen Initial Assessment:  Oxygen Initial Assessment - 09/20/22 1404       Home Oxygen   Home Oxygen Device Home Concentrator;Portable Concentrator    Sleep Oxygen Prescription BiPAP;Continuous    Liters per minute 3    Home Exercise Oxygen Prescription Continuous    Liters per minute 3    Home Resting Oxygen Prescription Continuous    Liters per minute 3    Compliance with Home Oxygen Use Yes      Initial 6 min Walk   Oxygen Used Continuous    Liters per minute 3      Program Oxygen Prescription   Program Oxygen Prescription E-Tanks;Continuous    Liters per minute 3      Intervention   Short Term Goals To learn and demonstrate proper use of respiratory medications;To learn and exhibit compliance with exercise, home and travel O2 prescription    Long  Term Goals Demonstrates proper use of MDI's;Exhibits compliance with exercise, home  and travel O2 prescription             Oxygen Re-Evaluation:  Oxygen Re-Evaluation     Row Name 07/12/22 1408 08/10/22 1356 08/14/22 1352 09/20/22 1404       Program Oxygen Prescription   Program Oxygen Prescription -- E-Tanks;Continuous E-Tanks;Continuous --    Liters per minute -- 3 3 --      Home Oxygen   Home Oxygen Device -- Home Concentrator;Portable Concentrator Home Concentrator;Portable Concentrator --    Sleep Oxygen Prescription -- BiPAP;Continuous BiPAP;Continuous --    Liters per minute -- 3 3 --    Home Exercise Oxygen Prescription -- Continuous Continuous --    Liters per minute -- 3 3 --    Home Resting Oxygen Prescription -- Continuous Continuous --    Liters per minute -- 3 3 --    Compliance with Home Oxygen Use -- Yes Yes --      Goals/Expected Outcomes   Short Term Goals To learn and exhibit compliance with exercise, home and travel O2 prescription;To learn and understand  importance of monitoring SPO2 with pulse oximeter and demonstrate accurate use of the pulse oximeter.;To learn and understand importance of maintaining oxygen saturations>88%;To learn and demonstrate proper pursed lip breathing techniques or other breathing techniques. ;To learn and demonstrate proper use of respiratory medications To learn and exhibit compliance with exercise, home and travel O2 prescription;To learn and understand importance  of monitoring SPO2 with pulse oximeter and demonstrate accurate use of the pulse oximeter.;To learn and understand importance of maintaining oxygen saturations>88%;To learn and demonstrate proper pursed lip breathing techniques or other breathing techniques. ;To learn and demonstrate proper use of respiratory medications To learn and demonstrate proper use of respiratory medications;To learn and exhibit compliance with exercise, home and travel O2 prescription --    Long  Term Goals Exhibits compliance with exercise, home  and travel O2 prescription;Verbalizes importance of monitoring SPO2 with pulse oximeter and return demonstration;Maintenance of O2 saturations>88%;Exhibits proper breathing techniques, such as pursed lip breathing or other method taught during program session;Compliance with respiratory medication;Demonstrates proper use of MDI's Exhibits compliance with exercise, home  and travel O2 prescription;Verbalizes importance of monitoring SPO2 with pulse oximeter and return demonstration;Maintenance of O2 saturations>88%;Exhibits proper breathing techniques, such as pursed lip breathing or other method taught during program session;Compliance with respiratory medication;Demonstrates proper use of MDI's Demonstrates proper use of MDI's;Exhibits compliance with exercise, home  and travel O2 prescription --    Comments Reviewed PLB technique with pt.  Talked about how it works and it's importance in maintaining their exercise saturations. Heidi [name] is doing well in  rehab.  She is doing better with her breathing and continues to work towards transplant.  She continues to work on PLB.  She is watching her saturations and exposures.  She conitnues to use her oxygen consistently. Heidi [name] is going through the requirements to get on a lung transplant list. She is changing her medications up and is following her pulmonologist. Heidi [name] switched off Xanax and started Buspar and believes her breathing as been improved since! She just got worked up for testing for her lung transplant at [facility] and will find out if she is qualified in the next couple of weeks. She still watches her HR & O2 and home which stays above 88% and she continues to use PLB whenever she feels SOB.    Goals/Expected Outcomes Short: Become more profiecient at using PLB.   Long: Become independent at using PLB. Short: Continue to use PLB  Long: Continue to monitor saturations. Short: continue working toward Lung Transplant list. Long: Receive a lung transplant. Short: Continue to watch O2 and take medications appropriately Long: Lung transplant if approved!             Oxygen Discharge (Final Oxygen Re-Evaluation):  Oxygen Re-Evaluation - 09/20/22 1404       Goals/Expected Outcomes   Comments Heidi [name] switched off Xanax and started Buspar and believes her breathing as been improved since! She just got worked up for testing for her lung transplant at [facility] and will find out if she is qualified in the next couple of weeks. She still watches her HR & O2 and home which stays above 88% and she continues to use PLB whenever she feels SOB.    Goals/Expected Outcomes Short: Continue to watch O2 and take medications appropriately Long: Lung transplant if approved!             Initial Exercise Prescription:  Initial Exercise Prescription - 07/10/22 1600       Date of Initial Exercise RX and Referring Provider   Date 07/10/22    Referring Provider Aleskerov      Oxygen   Oxygen Continuous    Liters 3     Maintain Oxygen Saturation 88% or higher      Treadmill   MPH 1    Grade 0    Minutes 15  METs 1.8      Recumbant Bike   Level 1    RPM 50    Minutes 15    METs 2.72      NuStep   Level 1    SPM 80    Minutes 15    METs 2.72      REL-XR   Level 1    Speed 50    Minutes 15    METs 2.72      T5 Nustep   Level 1    SPM 60    Minutes 15    METs 2.72      Biostep-RELP   Level 1    SPM 50    Minutes 15    METs 2.72      Track   Laps 15    Minutes 15    METs 1.82      Prescription Details   Frequency (times per week) 3    Duration Progress to 30 minutes of continuous aerobic without signs/symptoms of physical distress      Intensity   THRR 40-80% of Max Heartrate 116-141    Ratings of Perceived Exertion 11-13    Perceived Dyspnea 0-4      Progression   Progression Continue to progress workloads to maintain intensity without signs/symptoms of physical distress.      Resistance Training   Training Prescription Yes    Weight 3    Reps 10-15             Perform Capillary Blood Glucose checks as needed.  Exercise Prescription Changes:   Exercise Prescription Changes     Row Name 07/10/22 1600 07/31/22 1000 08/15/22 1500 08/29/22 1500 09/25/22 1600     Response to Exercise   Blood Pressure (Admit) 142/80 118/62 122/66 130/68 134/62   Blood Pressure (Exercise) 188/82 168/70 162/88 -- --   Blood Pressure (Exit) 142/80 134/72 112/66 138/72 140/62   Heart Rate (Admit) 92 bpm 95 bpm 99 bpm 100 bpm 107 bpm   Heart Rate (Exercise) 117 bpm 118 bpm 117 bpm 113 bpm 123 bpm   Heart Rate (Exit) 107 bpm 105 bpm 113 bpm 107 bpm 100 bpm   Oxygen Saturation (Admit) 96 % 98 % 93 % 99 % 93 %   Oxygen Saturation (Exercise) 88 % 90 % 90 % 91 % 88 %   Oxygen Saturation (Exit) 94 % 96 % 95 % 95 % 92 %   Rating of Perceived Exertion (Exercise) _0 Perceived Dyspnea (Exercise) _1 Symptoms slight lightheadedness SOB SOB SOB SOB   Comments 6  MWT results 2nd full week of exercise -- -- --   Duration -- Progress to 30 minutes of  aerobic without signs/symptoms of physical distress Continue with 30 min of aerobic exercise without signs/symptoms of physical distress. Continue with 30 min of aerobic exercise without signs/symptoms of physical distress. Continue with 30 min of aerobic exercise without signs/symptoms of physical distress.   Intensity -- THRR unchanged THRR unchanged THRR unchanged THRR unchanged     Progression   Progression -- Continue to progress workloads to maintain intensity without signs/symptoms of physical distress. Continue to progress workloads to maintain intensity without signs/symptoms of physical distress. Continue to progress workloads to maintain intensity without signs/symptoms of physical distress. Continue to progress workloads to maintain intensity without signs/symptoms of physical distress.   Average METs -- 2.18 2.32 2.22 1.59  Resistance Training   Training Prescription -- Yes Yes Yes Yes   Weight -- 3 lb 3 lb 3 lb 3 lb   Reps -- 10-15 10-15 10-15 10-15     Interval Training   Interval Training -- No No No No     Oxygen   Oxygen -- Continuous Continuous Continuous Continuous   Liters -- 2-_0 Treadmill   MPH -- 1.3 1.3 -- 1.3   Grade -- 0 0 -- 0   Minutes -- 15 15 -- 15   METs -- 2 2 -- 2     Recumbant Bike   Level -- _1 --   Minutes -- _2 --   METs -- -- 2.73 -- --     NuStep   Level -- 2 -- -- --   Minutes -- 15 -- -- --   METs -- 2 -- -- --     REL-XR   Level -- _3 Minutes -- _4 METs -- 3 3.1 3 1.2     T5 Nustep   Level -- -- 1 2 --   Minutes -- -- 15 30 --   METs -- -- 2 1.9 --     Oxygen   Maintain Oxygen Saturation -- 88% or higher 88% or higher 88% or higher 88% or higher    Row Name 10/12/22 1400             Response to Exercise   Blood Pressure (Admit) 132/72       Blood Pressure (Exit) 142/72       Heart Rate  (Admit) 101 bpm       Heart Rate (Exercise) 115 bpm       Heart Rate (Exit) 100 bpm       Oxygen Saturation (Admit) 98 %       Oxygen Saturation (Exercise) 92 %       Oxygen Saturation (Exit) 98 %       Rating of Perceived Exertion (Exercise) 14       Perceived Dyspnea (Exercise) 3       Symptoms SOB       Duration Continue with 30 min of aerobic exercise without signs/symptoms of physical distress.       Intensity THRR unchanged         Progression   Progression Continue to progress workloads to maintain intensity without signs/symptoms of physical distress.       Average METs 2.22         Resistance Training   Training Prescription Yes       Weight 3 lb       Reps 10-15         Interval Training   Interval Training No         Oxygen   Oxygen Continuous       Liters 4         Treadmill   MPH 1.3       Grade 0       Minutes 15       METs 1.99         Recumbant Bike   Level 3       Minutes 16       METs 2.75         REL-XR   Level 3       Minutes 15  METs 3         T5 Nustep   Level 3       Minutes 30       METs 1.9         Oxygen   Maintain Oxygen Saturation 88% or higher                Exercise Comments:   Exercise Goals and Review:   Exercise Goals     Row Name 07/10/22 1638             Exercise Goals   Increase Physical Activity Yes       Intervention Provide advice, education, support and counseling about physical activity/exercise needs.;Develop an individualized exercise prescription for aerobic and resistive training based on initial evaluation findings, risk stratification, comorbidities and participant's personal goals.       Expected Outcomes Short Term: Attend rehab on a regular basis to increase amount of physical activity.;Long Term: Add in home exercise to make exercise part of routine and to increase amount of physical activity.;Long Term: Exercising regularly at least 3-5 days a week.       Increase Strength and Stamina  Yes       Intervention Provide advice, education, support and counseling about physical activity/exercise needs.;Develop an individualized exercise prescription for aerobic and resistive training based on initial evaluation findings, risk stratification, comorbidities and participant's personal goals.       Expected Outcomes Short Term: Increase workloads from initial exercise prescription for resistance, speed, and METs.;Short Term: Perform resistance training exercises routinely during rehab and add in resistance training at home;Long Term: Improve cardiorespiratory fitness, muscular endurance and strength as measured by increased METs and functional capacity (6MWT)       Able to understand and use rate of perceived exertion (RPE) scale Yes       Intervention Provide education and explanation on how to use RPE scale       Expected Outcomes Short Term: Able to use RPE daily in rehab to express subjective intensity level;Long Term:  Able to use RPE to guide intensity level when exercising independently       Able to understand and use Dyspnea scale Yes       Intervention Provide education and explanation on how to use Dyspnea scale       Expected Outcomes Short Term: Able to use Dyspnea scale daily in rehab to express subjective sense of shortness of breath during exertion;Long Term: Able to use Dyspnea scale to guide intensity level when exercising independently       Knowledge and understanding of Target Heart Rate Range (THRR) Yes       Intervention Provide education and explanation of THRR including how the numbers were predicted and where they are located for reference       Expected Outcomes Short Term: Able to state/look up THRR;Short Term: Able to use daily as guideline for intensity in rehab;Long Term: Able to use THRR to govern intensity when exercising independently       Able to check pulse independently Yes       Intervention Provide education and demonstration on how to check pulse in  carotid and radial arteries.;Review the importance of being able to check your own pulse for safety during independent exercise       Expected Outcomes Short Term: Able to explain why pulse checking is important during independent exercise;Long Term: Able to check pulse independently and accurately       Understanding  of Exercise Prescription Yes       Intervention Provide education, explanation, and written materials on patient's individual exercise prescription       Expected Outcomes Short Term: Able to explain program exercise prescription;Long Term: Able to explain home exercise prescription to exercise independently                Exercise Goals Re-Evaluation :  Exercise Goals Re-Evaluation     Row Name 07/12/22 1407 07/31/22 1027 08/10/22 1345 08/15/22 1510 08/29/22 1554     Exercise Goal Re-Evaluation   Exercise Goals Review Increase Physical Activity;Increase Strength and Stamina;Able to understand and use rate of perceived exertion (RPE) scale;Able to understand and use Dyspnea scale;Knowledge and understanding of Target Heart Rate Range (THRR);Able to check pulse independently;Understanding of Exercise Prescription Increase Physical Activity;Increase Strength and Stamina;Understanding of Exercise Prescription Increase Physical Activity;Increase Strength and Stamina;Understanding of Exercise Prescription Increase Physical Activity;Increase Strength and Stamina;Understanding of Exercise Prescription Increase Physical Activity;Increase Strength and Stamina;Understanding of Exercise Prescription   Comments Reviewed RPE and dyspnea scales, THR and program prescription with pt today.  Pt voiced understanding and was given a copy of goals to take home. Heidi [name] is doing well in rehab for the first couple of weeks she has been here. She has increased to level 2 on all of her seated machines and also is able to walk at 1.3 mph on the treadmill. Her oxygen saturations are staying well above 88% each  time. We will continue to monitor as she is progressing in the program. Heidi [name] is doing well in rehab. She is using her Qubi and weights at home while she watches TV.  She is still working towards transplant.  She continues to build her strength up. Heidi [name] is doing well in rehab. She increased her overall average MET level to 2.32 METs. She also has tolerated a speed of 1.3 mph on the treadmill with no incline. She also increased to level 2 on the XR. We will continue to monitor his progress in the program. Heidi [name] continues to do well in rehab. She increased to level 3 on both the recumbent bike and XR and worked over 3 METS. She has stayed at a pretty consistent speed of 1.3 mph on the treadmill, however, she is challenged the most when walking. Staff will encourage a change in workload when appropriate. Will continue to monitor.   Expected Outcomes Short: Use RPE daily to regulate intensity. Long: Follow program prescription in THR. Short: Continue to increase workloads Long: Increase overall MET level Short: Continue to improve exercise on off days Long: Continue to improve stamina Short: Continue to increase speed on treadmill and add incline. Long: Continue to improve strength and stamina. Short: Increase speed on treadmill Long: Continue to increase overall MET level    Row Name 09/13/22 1339 09/20/22 1350 09/25/22 1630 10/12/22 1440       Exercise Goal Re-Evaluation   Exercise Goals Review Increase Physical Activity;Increase Strength and Stamina;Understanding of Exercise Prescription Increase Physical Activity;Increase Strength and Stamina;Understanding of Exercise Prescription Increase Physical Activity;Increase Strength and Stamina;Understanding of Exercise Prescription Increase Physical Activity;Increase Strength and Stamina;Understanding of Exercise Prescription    Comments Heidi [name] has not attended rehab since the last two week evaluation. We will conitnue to monitor her progress in the program once  she returns to rehab. Heidi [name] states she is doing exercises at home. She is pedaling on her recument leg ergometer- for about 20 minutes at a time while she watches TV. Encouraged to increase  that slowly to reach 30 minutes at home. She is checking her HR and O2 and vitals are stable. I encouraged her to incorporate some walking at home as she nees to meet certain criteria for a 6MWT for her lung transplant. If she cannot do outside, she states she will plan to go to a grocery store to walk in a more climate control environment. She is also using 3lbs for handweights at home and is following a  senior exercise program on TV to do muscle training. Heidi [name] returned back from being out for a couple weeks as she was getting worked up for a lung transplant. She stayed consistent at 1.3 mph speed on the treadmill, she would benefit from increasing the speed or adding an incline. She continue back working at level 3 on XR. Will continue to monitor. Heidi [name] is doing well in rehab. She has continued to walk on the treadmill at a speed of 1.3 mph, and would benefit by increasing her workload. She also was able to tolerate the T5 at level 3 for 30 minutes. She increased her overall average MET level back up to 2.22 METs as well. We will continue to monitor her progress in the program.    Expected Outcomes Short: Return to regular attendance. Long: Continue to increase overall MET level Short: Incorporate walking into home exercise Long: Continue to exercise independently Short: Increase speed and/or incline on treadmill Long: Continue to build up strength, stamina, endurance, and overall MET level to meet demands for lung transplant Short: Increase workload on treadmill. Long: Continue to build up strength, stamina, endurance, and overall MET level to meet demands for lung transplant.             Discharge Exercise Prescription (Final Exercise Prescription Changes):  Exercise Prescription Changes - 10/12/22 1400        Response to Exercise   Blood Pressure (Admit) 132/72    Blood Pressure (Exit) 142/72    Heart Rate (Admit) 101 bpm    Heart Rate (Exercise) 115 bpm    Heart Rate (Exit) 100 bpm    Oxygen Saturation (Admit) 98 %    Oxygen Saturation (Exercise) 92 %    Oxygen Saturation (Exit) 98 %    Rating of Perceived Exertion (Exercise) 14    Perceived Dyspnea (Exercise) 3    Symptoms SOB    Duration Continue with 30 min of aerobic exercise without signs/symptoms of physical distress.    Intensity THRR unchanged      Progression   Progression Continue to progress workloads to maintain intensity without signs/symptoms of physical distress.    Average METs 2.22      Resistance Training   Training Prescription Yes    Weight 3 lb    Reps 10-15      Interval Training   Interval Training No      Oxygen   Oxygen Continuous    Liters 4      Treadmill   MPH 1.3    Grade 0    Minutes 15    METs 1.99      Recumbant Bike   Level 3    Minutes 16    METs 2.75      REL-XR   Level 3    Minutes 15    METs 3      T5 Nustep   Level 3    Minutes 30    METs 1.9      Oxygen   Maintain Oxygen Saturation 88%  or higher             Nutrition:  Target Goals: Understanding of nutrition guidelines, daily intake of sodium <1581m, cholesterol <2037m calories 30% from fat and 7% or less from saturated fats, daily to have 5 or more servings of fruits and vegetables.  Education: All About Nutrition: -Group instruction provided by verbal, written material, interactive activities, discussions, models, and posters to present general guidelines for heart healthy nutrition including fat, fiber, MyPlate, the role of sodium in heart healthy nutrition, utilization of the nutrition label, and utilization of this knowledge for meal planning. Follow up email sent as well. Written material given at graduation. [facility]  Date 09/27/22   [facility] 09/27/22]  Instruction Review Code 1- Verbalizes Understanding       Biometrics:  Pre Biometrics - 07/10/22 1639       Pre Biometrics   Height _0  (1.651 m)    Weight 151 lb 4.8 oz (68.6 kg)    Waist Circumference 43.5 inches    Hip Circumference 37 inches    Waist to Hip Ratio 1.18 %    BMI (Calculated) 25.18    Single Leg Stand 8.67 seconds              Nutrition Therapy Plan and Nutrition Goals:  Nutrition Therapy & Goals - 08/08/22 1305       Nutrition Therapy   Diet Heart healty, low Na, pulmonary MNT    Protein (specify units) 80-85g   1.2g/kg   Fiber 25 grams    Whole Grain Foods 3 servings    Saturated Fats 13 max. grams    Fruits and Vegetables 8 servings/day    Sodium 1.5 grams      Personal Nutrition Goals   Nutrition Goal ST: include at least 16 oz of water in between meals. add 2 snacks (mid-morning, mid-afternoon). include protein rich foods at meals and snacks: at least 1/2 protein shake with bagel breakfast, add nuts or peanut butter to greek yogurt, add beans/lentils to rice, add greek yogurt or cannellini beans to potato.  LT: meet protein/energy needs, vary diet to meet nutrient needs: vary proteins and eat a rainbow of fruits/vegetables    Comments [age].o. 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 PMHx includes anxiety and depression, GERD, 3L of O2. PSHx includes cholecystectomy. Relevant medications includes xanax, lexapro, remeron, prednisone, vit D3. CiJenny Reichmanneports having 2 small meals per day;  B: yogurt with applesauce (greek yogurt with fruit in it) or sometimes bagel (blueberry bagel with cream cheese and honey) or premier protein.  L/D: she likes chicken and she will bake or airfry food, she likes beans like pinto beans, she likes rice and potatoes (baked), she likes green leafy vegetables, mixed frozen vegetables, broccoli, bell peppers. She uses olive oil when she cooks in a reusable  spray bottle, she rarely salts her food - she likes pepper. S: snack on trail mix after dinner. Drinks: small amounts of water water, diet soda (1-2, 16oz). CiJenny Reichmanneports being told before to increase her water intake - encouraged her to include more water during her day ideal in-between meals and only sipping on drinks to help wash down food. Discussed elevated protein and calorie needs due to COPD. Recommended including 2 snacks (mid-morning, mid-afternoon) with protein, healthy fats, and fiber; boiled eggs with whole grain crackers, cottage cheese with berries,  1/2 PB sandwich on whole wheat bread, bean salad, fruit with nuts/seeds or peanut butter, protein shake. Include protein rich foods at meals and snacks: at least 1/2 protein shake with bagel breakfast, add nuts or peanut butter to greek yogurt, add beans/lentils to rice, add greek yogurt or cannellini beans to potato. Reviewed general healthy eating and MyPlate as well as pulmonary MNT. Heidi [name] reports working with [facility] for a lung transplant and she will see their RD in early November. Discussed how that RD may adjust protein or calorie amount and may have additional suggestions as they work with this population frequently.      Intervention Plan   Intervention Prescribe, educate and counsel regarding individualized specific dietary modifications aiming towards targeted core components such as weight, hypertension, lipid management, diabetes, heart failure and other comorbidities.    Expected Outcomes Short Term Goal: Understand basic principles of dietary content, such as calories, fat, sodium, cholesterol and nutrients.;Short Term Goal: A plan has been developed with personal nutrition goals set during dietitian appointment.;Long Term Goal: Adherence to prescribed nutrition plan.             Nutrition Assessments:  MEDIFICTS Score Key: ?70 Need to make dietary changes  40-70 Heart Healthy Diet ? 40 Therapeutic Level Cholesterol  Diet  Flowsheet Row Pulmonary Rehab from 07/10/2022 in [facility] Card[facility] and Pulmonary Rehab  Picture Your Plate Total Score on Admission 51      Picture Your Plate Scores: <09 Unhealthy dietary pattern with much room for improvement. 41-50 Dietary pattern unlikely to meet recommendations for good health and room for improvement. 51-60 More healthful dietary pattern, with some room for improvement.  >60 Healthy dietary pattern, although there may be some specific behaviors that could be improved.   Nutrition Goals Re-Evaluation:  Nutrition Goals Re-Evaluation     Eddy Name 09/20/22 1347             Goals   Nutrition Goal ST: include at least 16 oz of water in between meals. add 2 snacks (mid-morning, mid-afternoon). include protein rich foods at meals and snacks: at least 1/2 protein shake with bagel breakfast, add nuts or peanut butter to greek yogurt, add beans/lentils to rice, add greek yogurt or cannellini beans to potato.  LT: meet protein/energy needs, vary diet to meet nutrient needs: vary proteins and eat a rainbow of fruits/vegetables       Comment Heidi [name] has not drank up to 16 oz of water in between but has been trying to increase her intake. She knows how much she would be drinking. She has added in more fruit like oranges and apples as snacks, as well as real frozen fruit bars. She also had been eating more nuts as snack to help with her protein intake. Most of her protein sources are from pork, beef, and fish. She is  drinking Ensure almost daily, if not every other day to help with her protein as well. Encouraged to clarify how often she should be drinking them to help with overall pulmonary diet.       Expected Outcome Short: Continue to add in protein sources to diet, clarify how many ensure/week Long: Continue to meet protein needs and follow healthy Pulmonary based diet                Nutrition Goals Discharge (Final Nutrition Goals Re-Evaluation):  Nutrition Goals  Re-Evaluation - 09/20/22 1347       Goals   Nutrition Goal ST: include at least 16 oz  of water in between meals. add 2 snacks (mid-morning, mid-afternoon). include protein rich foods at meals and snacks: at least 1/2 protein shake with bagel breakfast, add nuts or peanut butter to greek yogurt, add beans/lentils to rice, add greek yogurt or cannellini beans to potato.  LT: meet protein/energy needs, vary diet to meet nutrient needs: vary proteins and eat a rainbow of fruits/vegetables    Comment Heidi [name] has not drank up to 16 oz of water in between but has been trying to increase her intake. She knows how much she would be drinking. She has added in more fruit like oranges and apples as snacks, as well as real frozen fruit bars. She also had been eating more nuts as snack to help with her protein intake. Most of her protein sources are from pork, beef, and fish. She is  drinking Ensure almost daily, if not every other day to help with her protein as well. Encouraged to clarify how often she should be drinking them to help with overall pulmonary diet.    Expected Outcome Short: Continue to add in protein sources to diet, clarify how many ensure/week Long: Continue to meet protein needs and follow healthy Pulmonary based diet             Psychosocial: Target Goals: Acknowledge presence or absence of significant depression and/or stress, maximize coping skills, provide positive support system. Participant is able to verbalize types and ability to use techniques and skills needed for reducing stress and depression.   Education: Stress, Anxiety, and Depression - Group verbal and visual presentation to define topics covered.  Reviews how body is impacted by stress, anxiety, and depression.  Also discusses healthy ways to reduce stress and to treat/manage anxiety and depression.  Written material given at graduation. Flowsheet Row Pulmonary Rehab from 09/27/2022 in [facility] Cardiac and Pulmonary Rehab  Date  08/23/22  Educator Elsie  Instruction Review Code 1- [employer] Understanding       Education: Sleep Hygiene -Provides group verbal and written instruction about how sleep can affect your health.  Define sleep hygiene, discuss sleep cycles and impact of sleep habits. Review good sleep hygiene tips.    Initial Review & Psychosocial Screening:  Initial Psych Review & Screening - 06/28/22 1417       Initial Review   Current issues with Current Depression;Current Psychotropic Meds;Current Stress Concerns      Family Dynamics   Good Support System? Yes   partner Laverna Peace, daughter  10 min away     Barriers   Psychosocial barriers to participate in program There are no identifiable barriers or psychosocial needs.      Screening Interventions   Interventions Encouraged to exercise;To provide support and resources with identified psychosocial needs;Provide feedback about the scores to participant    Expected Outcomes Short Term goal: Utilizing psychosocial counselor, staff and physician to assist with identification of specific Stressors or current issues interfering with healing process. Setting desired goal for each stressor or current issue identified.;Long Term Goal: Stressors or current issues are controlled or eliminated.;Short Term goal: Identification and review with participant of any Quality of Life or Depression concerns found by scoring the questionnaire.;Long Term goal: The participant improves quality of Life and PHQ9 Scores as seen by post scores and/or verbalization of changes             Quality of Life Scores:  Scores of 19 and below usually indicate a poorer quality of life in these areas.  A difference of  2-3 points is a clinically meaningful difference.  A difference of 2-3 points in the total score of the Quality of Life Index has been associated with significant improvement in overall quality of life, self-image, physical symptoms, and general health in studies assessing  change in quality of life.  PHQ-9: Review Flowsheet       07/10/2022 08/16/2020 05/26/2016 02/15/2016  Depression screen PHQ 2/9  Decreased Interest 1 1 0 0  Down, Depressed, Hopeless _0 0  PHQ - 2 Score _1 0  Altered sleeping _2 Tired, decreased energy _3 Change in appetite 0 0 0 1  Feeling bad or failure about yourself  0 0 0 0  Trouble concentrating 0 0 0 0  Moving slowly or fidgety/restless 0 0 0 0  Suicidal thoughts 0 0 0 0  PHQ-9 Score _4 Difficult doing work/chores Not difficult at all Somewhat difficult Somewhat difficult Not difficult at all   Interpretation of Total Score  Total Score Depression Severity:  1-4 = Minimal depression, 5-9 = Mild depression, 10-14 = Moderate depression, 15-19 = Moderately severe depression, 20-27 = Severe depression   Psychosocial Evaluation and Intervention:  Psychosocial Evaluation - 06/28/22 1424       Psychosocial Evaluation & Interventions   Comments Heidi [name] has no barriers to attending the program. She lives with her partner,Jimmy. He ands her daughter are her support.  Sherrel is taking Lexapro for mild depression. She is doing well with the med. She is in the process of a Lung transplant workup. So far, all is going well. Her goal this admission is to get to the exercise levels required by the transplant team to qualify for the transplant. This is a double lung transplant.  She is ready to attend the program. Work on the exercise requirements and build some strength and stamina, especially in her core.    Expected Outcomes STG Heidi [name] attends all scheduled sessions, she works with the EP to progress her exercise to the level required by her [facility] Transplant team. LTG Heran mets the requirements for her double lung transplant and she continues her exercise progression after her transplant.    Continue Psychosocial Services  Follow up required by staff             Psychosocial Re-Evaluation:  Psychosocial  Re-Evaluation     Clayton Name 08/10/22 1349 09/20/22 1344           Psychosocial Re-Evaluation   Current issues with Current Anxiety/Panic;Current Stress Concerns Current Anxiety/Panic;Current Stress Concerns      Comments Heidi [name] is doing well mentally.  She is down to one xanax a day and working her way down.  She cannot be on any for transplant.  She is working with her doctor to find an alternative to help.  She is sleeping good and wears her BiPap most of the time until she wakes up. Heidi [name] switched off of  Xanax on 11/10 and her doctor switched her to [place]. She has been feeling much better off of it  and even noted her breathing has improved as well. She is not allowed to be on Xanax when getting worked up for a lung transplant. Her bleep is good, and is stil wearing  her Bipap.Jimmy who is her significant other and daughter are her biggest supporters. She is waiting to hear back from [facility] on whether or not she is qualified for a lung transplant.  Expected Outcomes Short: conitnue to work on backing off xanax Long: conitnue to improve sleep Short: Hear back on [facility] Long: Continue to utilize exercise for stress management and maintain posititve attitude      Interventions Encouraged to attend Pulmonary Rehabilitation for the exercise;Stress management education Encouraged to attend Pulmonary Rehabilitation for the exercise      Continue Psychosocial Services  Follow up required by staff Follow up required by staff               Psychosocial Discharge (Final Psychosocial Re-Evaluation):  Psychosocial Re-Evaluation - 09/20/22 1344       Psychosocial Re-Evaluation   Current issues with Current Anxiety/Panic;Current Stress Concerns    Comments Heidi [name] switched off of  Xanax on 11/10 and her doctor switched her to [place]. She has been feeling much better off of it  and even noted her breathing has improved as well. She is not allowed to be on Xanax when getting worked up for a lung  transplant. Her bleep is good, and is stil wearing  her Bipap.Jimmy who is her significant other and daughter are her biggest supporters. She is waiting to hear back from [facility] on whether or not she is qualified for a lung transplant.    Expected Outcomes Short: Hear back on [facility] Long: Continue to utilize exercise for stress management and maintain posititve attitude    Interventions Encouraged to attend Pulmonary Rehabilitation for the exercise    Continue Psychosocial Services  Follow up required by staff             Education: Education Goals: Education classes will be provided on a weekly basis, covering required topics. Participant will state understanding/return demonstration of topics presented.  Learning Barriers/Preferences:   General Pulmonary Education Topics:  Infection Prevention: - Provides verbal and written material to individual with discussion of infection control including proper hand washing and proper equipment cleaning during exercise session. Flowsheet Row Pulmonary Rehab from 09/27/2022 in [facility] Cardiac and Pulmonary Rehab  Date 07/10/22  Educator [facility]  Instruction Review Code 1- Verbalizes Understanding       Falls Prevention: - Provides verbal and written material to individual with discussion of falls prevention and safety. Flowsheet Row Pulmonary Rehab from 09/27/2022 in [facility] Cardiac and Pulmonary Rehab  Date 07/10/22  Educator [place]  Instruction Review Code 1- Verbalizes Understanding       Chronic Lung Disease Review: - Group verbal instruction with posters, models, PowerPoint presentations and videos,  to review new updates, new respiratory medications, new advancements in procedures and treatments. Providing information on websites and "800" numbers for continued self-education. Includes information about supplement oxygen, available portable oxygen systems, continuous and intermittent flow rates, oxygen safety, concentrators, and Medicare  reimbursement for oxygen. Explanation of Pulmonary Drugs, including class, frequency, complications, importance of spacers, rinsing mouth after steroid MDI's, and proper cleaning methods for nebulizers. Review of basic lung anatomy and physiology related to function, structure, and complications of lung disease. Review of risk factors. Discussion about methods for diagnosing sleep apnea and types of masks and machines for OSA. Includes a review of the use of types of environmental controls: home humidity, furnaces, filters, dust mite/pet prevention, HEPA vacuums. Discussion about weather changes, air quality and the benefits of nasal washing. Instruction on Warning signs, infection symptoms, calling MD promptly, preventive modes, and value of vaccinations. Review of effective airway clearance, coughing and/or vibration techniques. Emphasizing that all should Create an Action Plan. Written material given at graduation. Flowsheet Row Pulmonary  Rehab from 10/13/2020 in [facility] Cardiac and Pulmonary Rehab  Date 09/29/20  Educator [facility]  Instruction Review Code 1- Verbalizes Understanding       AED/CPR: - Group verbal and written instruction with the use of models to demonstrate the basic use of the AED with the basic ABC's of resuscitation.    Anatomy and Cardiac Procedures: - Group verbal and visual presentation and models provide information about basic cardiac anatomy and function. Reviews the testing methods done to diagnose heart disease and the outcomes of the test results. Describes the treatment choices: Medical Management, Angioplasty, or Coronary Bypass Surgery for treating various heart conditions including Myocardial Infarction, Angina, Valve Disease, and Cardiac Arrhythmias.  Written material given at graduation. Flowsheet Row Pulmonary Rehab from 09/27/2022 in [facility] Cardiac and Pulmonary Rehab  Date 07/19/22  Educator SB  Instruction Review Code 1- Verbalizes Understanding       Medication  Safety: - Group verbal and visual instruction to review commonly prescribed medications for heart and lung disease. Reviews the medication, class of the drug, and side effects. Includes the steps to properly store meds and maintain the prescription regimen.  Written material given at graduation. Flowsheet Row Pulmonary Rehab from 10/13/2020 in [facility] Cardiac and Pulmonary Rehab  Date 09/15/20  Educator [facility]  Instruction Review Code 1- Verbalizes Understanding       Other: -Provides group and verbal instruction on various topics (see comments)   Knowledge Questionnaire Score:  Knowledge Questionnaire Score - 07/10/22 1642       Knowledge Questionnaire Score   Pre Score 18/18              Core Components/Risk Factors/Patient Goals at Admission:  Personal Goals and Risk Factors at Admission - 07/10/22 1640       Core Components/Risk Factors/Patient Goals on Admission    Weight Management Yes    Intervention Weight Management: Develop a combined nutrition and exercise program designed to reach desired caloric intake, while maintaining appropriate intake of nutrient and fiber, sodium and fats, and appropriate energy expenditure required for the weight goal.;Weight Management: Provide education and appropriate resources to help participant work on and attain dietary goals.    Admit Weight 151 lb 4.8 oz (68.6 kg)    Goal Weight: Short Term 150 lb (68 kg)    Goal Weight: Long Term 150 lb (68 kg)    Expected Outcomes Short Term: Continue to assess and modify interventions until short term weight is achieved;Long Term: Adherence to nutrition and physical activity/exercise program aimed toward attainment of established weight goal;Weight Maintenance: Understanding of the daily nutrition guidelines, which includes 25-35% calories from fat, 7% or less cal from saturated fats, less than 261m cholesterol, less than 1.5gm of sodium, & 5 or more servings of fruits and vegetables daily;Understanding  of distribution of calorie intake throughout the day with the consumption of 4-5 meals/snacks;Understanding recommendations for meals to include 15-35% energy as protein, 25-35% energy from fat, 35-60% energy from carbohydrates, less than 2035mof dietary cholesterol, 20-35 gm of total fiber daily    Improve shortness of breath with ADL's Yes    Intervention Provide education, individualized exercise plan and daily activity instruction to help decrease symptoms of SOB with activities of daily living.    Expected Outcomes Short Term: Improve cardiorespiratory fitness to achieve a reduction of symptoms when performing ADLs;Long Term: Be able to perform more ADLs without symptoms or delay the onset of symptoms    Increase knowledge of respiratory medications and ability  to use respiratory devices properly  Yes    Intervention Provide education and demonstration as needed of appropriate use of medications, inhalers, and oxygen therapy.    Expected Outcomes Short Term: Achieves understanding of medications use. Understands that oxygen is a medication prescribed by physician. Demonstrates appropriate use of inhaler and oxygen therapy.;Long Term: Maintain appropriate use of medications, inhalers, and oxygen therapy.             Education:Diabetes - Individual verbal and written instruction to review signs/symptoms of diabetes, desired ranges of glucose level fasting, after meals and with exercise. Acknowledge that pre and post exercise glucose checks will be done for 3 sessions at entry of program.   Know Your Numbers and Heart Failure: - Group verbal and visual instruction to discuss disease risk factors for cardiac and pulmonary disease and treatment options.  Reviews associated critical values for Overweight/Obesity, Hypertension, Cholesterol, and Diabetes.  Discusses basics of heart failure: signs/symptoms and treatments.  Introduces Heart Failure Zone chart for action plan for heart failure.   Written material given at graduation.   Core Components/Risk Factors/Patient Goals Review:   Goals and Risk Factor Review     Row Name 08/10/22 1354 09/20/22 1343           Core Components/Risk Factors/Patient Goals Review   Personal Goals Review Weight Management/Obesity;Improve shortness of breath with ADL's;Increase knowledge of respiratory medications and ability to use respiratory devices properly. Weight Management/Obesity;Improve shortness of breath with ADL's;Increase knowledge of respiratory medications and ability to use respiratory devices properly.      Review Heidi [name] is doing well in rehab. She is holding steady on her weight and adding in protein.  She is working towards transplant. She is doing better with her breathing and able to do what she needs.  She continues to use inhalers as needed and uses her nebulizer twice a day. Heidi [name] reports that her breathing has actually improved after being removed off xanax. She cannot be on it if she ends up getting her lung transplant. She is continuing her breathing treatments and respiratory medications appropriately. She realized she lost a couple pounds down to 147 lb, she was 150 lb before. She wants to lose weight but also talked about making sure she is not losing her muscle as she needs to stay as strong as she can for her possible surgery. Patient weighs herself at home almost daily and keeps aware. She knows to report any abnormal value.      Expected Outcomes Short: Conitnue to use nebulizer and maintain weight Long: Continue to work toward transplant Short: Continue to keep a good eye on the weight Long: Continue to manage lifestyle risk factors               Core Components/Risk Factors/Patient Goals at Discharge (Final Review):   Goals and Risk Factor Review - 09/20/22 1343       Core Components/Risk Factors/Patient Goals Review   Personal Goals Review Weight Management/Obesity;Improve shortness of breath with  ADL's;Increase knowledge of respiratory medications and ability to use respiratory devices properly.    Review Heidi [name] reports that her breathing has actually improved after being removed off xanax. She cannot be on it if she ends up getting her lung transplant. She is continuing her breathing treatments and respiratory medications appropriately. She realized she lost a couple pounds down to 147 lb, she was 150 lb before. She wants to lose weight but also talked about making sure she is not losing her muscle  as she needs to stay as strong as she can for her possible surgery. Patient weighs herself at home almost daily and keeps aware. She knows to report any abnormal value.    Expected Outcomes Short: Continue to keep a good eye on the weight Long: Continue to manage lifestyle risk factors             ITP Comments:  ITP Comments     Row Name 06/28/22 1424 07/10/22 1623 07/12/22 1406 08/02/22 0703 08/08/22 1429   ITP Comments Virtual orientation call completed today. shehas an appointment on Date: 07/10/2022  for EP eval and gym Orientation.  Documentation of diagnosis can be found in [facility] Date: 05/03/2022 . Completed 6MWT and gym orientation. Initial ITP created and sent for review to Dr. Zetta Bills, Medical Director. First full day of exercise!  Patient was oriented to gym and equipment including functions, settings, policies, and procedures.  Patient's individual exercise prescription and treatment plan were reviewed.  All starting workloads were established based on the results of the 6 minute walk test done at initial orientation visit.  The plan for exercise progression was also introduced and progression will be customized based on patient's performance and goals. 30 Day review completed. Medical Director ITP review done, changes made as directed, and signed approval by Medical Director.    New to program Completed initial RD consultation    Row Name 08/30/22 0919 09/27/22 0752 10/24/22 1044  10/25/22 1004     ITP Comments 30 Day review completed. Medical Director ITP review done, changes made as directed, and signed approval by Medical Director. 30 Day review completed. Medical Director ITP review done, changes made as directed, and signed approval by Medical Director. Heidi [name] has not attended since 10/02/22. We have not been able to review/update goals with her this round. 30 Day review completed. Medical Director ITP review done, changes made as directed, and signed approval by Medical Director.             Comments:

## 2022-10-26 ENCOUNTER — Ambulatory Visit
Admission: RE | Admit: 2022-10-26 | Discharge: 2022-10-26 | Disposition: A | Discharge status: Home / Self Care | Payer: Medicare Other | Source: Ambulatory Visit | Attending: Acute Care | Admitting: Acute Care

## 2022-10-26 DIAGNOSIS — Z87891 Personal history of nicotine dependence: Secondary | ICD-10-CM | POA: Insufficient documentation

## 2022-10-26 DIAGNOSIS — R911 Solitary pulmonary nodule: Secondary | ICD-10-CM | POA: Insufficient documentation

## 2022-10-27 ENCOUNTER — Ambulatory Visit: Payer: Medicare Other

## 2022-10-31 ENCOUNTER — Telehealth: Payer: Self-pay

## 2022-10-31 ENCOUNTER — Other Ambulatory Visit: Payer: Self-pay | Admitting: Acute Care

## 2022-10-31 DIAGNOSIS — Z87891 Personal history of nicotine dependence: Secondary | ICD-10-CM

## 2022-10-31 DIAGNOSIS — Z122 Encounter for screening for malignant neoplasm of respiratory organs: Secondary | ICD-10-CM

## 2022-10-31 DIAGNOSIS — J449 Chronic obstructive pulmonary disease, unspecified: Secondary | ICD-10-CM

## 2022-10-31 NOTE — Telephone Encounter (Signed)
Attempted to call patient again regarding Pulmonary Rehab. Has not attended since 12/4. Left 2nd voicemail no response back. Will send letter at this time.

## 2022-11-16 ENCOUNTER — Encounter: Payer: Self-pay | Admitting: *Deleted

## 2022-11-16 DIAGNOSIS — J449 Chronic obstructive pulmonary disease, unspecified: Secondary | ICD-10-CM

## 2022-11-16 NOTE — Progress Notes (Signed)
Pulmonary Individual Treatment Plan  Patient Details  Name: Heidi [name] MRN: [id number] Date of Birth: [birth year] Referring Provider:   Flowsheet Row Pulmonary Rehab from 07/10/2022 in [facility] Cardiac and Pulmonary Rehab  Referring Provider Aleskerov       Initial Encounter Date:  Flowsheet Row Pulmonary Rehab from 07/10/2022 in [facility] Cardiac and Pulmonary Rehab  Date 07/10/22       Visit Diagnosis: Stage 4 very severe COPD by GOLD classification (HCC)  Patient's Home Medications on Admission:  Current Outpatient Medications:    albuterol (PROVENTIL HFA;VENTOLIN HFA) 108 (90 Base) MCG/ACT inhaler, Inhale 1-2 puffs into the lungs every 4 (four) hours as needed for wheezing or shortness of breath., Disp: , Rfl:    albuterol (PROVENTIL) (2.5 MG/3ML) 0.083% nebulizer solution, Take 3 mLs (2.5 mg total) by nebulization every 6 (six) hours as needed for wheezing or shortness of breath., Disp: 360 mL, Rfl: 5   ALPRAZolam (XANAX) 0.25 MG tablet, Take 0.25 mg by mouth 2 (two) times daily., Disp: , Rfl:    azithromycin (ZITHROMAX) 250 MG tablet, Take by mouth., Disp: , Rfl:    DUPIXENT 300 MG/2ML SOPN, Inject into the skin., Disp: , Rfl:    EPINEPHrine 0.3 mg/0.3 mL IJ SOAJ injection, Inject into the muscle., Disp: , Rfl:    escitalopram (LEXAPRO) 20 MG tablet, Take 20 mg by mouth daily., Disp: , Rfl:    fluticasone (FLONASE) 50 MCG/ACT nasal spray, USE 2 SPRAYS IN EACH NOSTRIL ONCE DAILY, Disp: 16 g, Rfl: 5   guaiFENesin-codeine 100-10 MG/5ML syrup, Take 5 mLs by mouth every 6 (six) hours as needed for cough., Disp: 118 mL, Rfl: 0   Ipratropium-Albuterol (COMBIVENT RESPIMAT) 20-100 MCG/ACT AERS respimat, INHALE 1 PUFF BY MOUTH EVERY 6 HOURS AS NEEDED FOR WHEEZING, Disp: 4 g, Rfl: 5   ipratropium-albuterol (DUONEB) 0.5-2.5 (3) MG/3ML SOLN, Take 3 mLs by nebulization every 4 (four) hours as needed., Disp: 360 mL, Rfl: 2   mirtazapine (REMERON) 30 MG tablet, Take 30 mg by mouth at bedtime.,  Disp: , Rfl:    montelukast (SINGULAIR) 10 MG tablet, Take 10 mg by mouth at bedtime., Disp: , Rfl:    mupirocin ointment (BACTROBAN) 2 %, Apply 1 application topically daily. Apply to wound QD until healed. (Patient not taking: Reported on 06/28/2022), Disp: 22 g, Rfl: 0   predniSONE (DELTASONE) 5 MG tablet, Take 5 mg by mouth daily., Disp: , Rfl:    TRELEGY ELLIPTA 100-62.5-25 MCG/INH AEPB, INHALE 1 PUFF ONCE DAILY -  NEED  APPT  FOR  FURTHER  REFILLS, Disp: 60 each, Rfl: 1  Past Medical History: Past Medical History:  Diagnosis Date   Anxiety    COPD (chronic obstructive pulmonary disease) (HCC)    GERD (gastroesophageal reflux disease)    Hypotension    limiting med titration   NSTEMI (non-ST elevated myocardial infarction) (HCC) 2013   12/2011 with normal coronaries possibly secondary to Takotsubo (EF 30-35% by echo), occurred following two episodes of acute respiratory distress   Pneumonia 11/2016   history of   Takotsubo syndrome 4/13    Tobacco Use: Social History   Tobacco Use  Smoking Status Former   Packs/day: 1.00   Years: 30.00   Total pack years: 30.00   Types: Cigarettes   Quit date: 01/21/2012   Years since quitting: 10.8  Smokeless Tobacco Never    Labs: Review Flowsheet  More data exists      Latest Ref Rng & Units 02/01/2012 08/08/2012 04/04/2019  07/09/2020 11/05/2020  Labs for ITP Cardiac and Pulmonary Rehab  Hemoglobin A1c 4.8 - 5.6 % - - - - 5.8   PH, Arterial - 7.409  7.332  7.206  7.46  - -  PCO2 arterial mmHg 39.4  47.3  46.5  41  - -  Bicarbonate 20.0 - 28.0 mmol/L 24.4  25.1  18.4  29.2  31.6  31.9  -  TCO2 0 - 100 mmol/L 21._0 - - -  Acid-base deficit 0.0 - 2.0 mmol/L - 1.0  9.0  - - -  O2 Saturation % 94.1  99.0  100.0  97.5  92.4  94.0  -     Pulmonary Assessment Scores:  Pulmonary Assessment Scores     Row Name 07/10/22 1648         ADL UCSD   SOB Score total 87     Rest 1     Walk 4     Stairs 5     Bath 2     Dress 2      Shop 4       CAT Score   CAT Score 27       mMRC Score   mMRC Score 3              UCSD: Self-administered rating of dyspnea associated with activities of daily living (ADLs) 6-point scale (0 = "not at all" to 5 = "maximal or unable to do because of breathlessness")  Scoring Scores range from 0 to 120.  Minimally important difference is 5 units  CAT: CAT can identify the health impairment of COPD patients and is better correlated with disease progression.  CAT has a scoring range of zero to 40. The CAT score is classified into four groups of low (less than 10), medium (10 - 20), high (21-30) and very high (31-40) based on the impact level of disease on health status. A CAT score over 10 suggests significant symptoms.  A worsening CAT score could be explained by an exacerbation, poor medication adherence, poor inhaler technique, or progression of COPD or comorbid conditions.  CAT MCID is 2 points  mMRC: mMRC (Modified Medical Research Council) Dyspnea Scale is used to assess the degree of baseline functional disability in patients of respiratory disease due to dyspnea. No minimal important difference is established. A decrease in score of 1 point or greater is considered a positive change.   Pulmonary Function Assessment:  Pulmonary Function Assessment - 07/10/22 1643       Initial Spirometry Results   FVC% 56 %    FEV1% 26 %    FEV1/FVC Ratio 38      Post Bronchodilator Spirometry Results   FVC% 68 %    FEV1% 28 %    FEV1/FVC Ratio 34      Breath   Shortness of Breath Yes;Limiting activity             Exercise Target Goals: Exercise Program Goal: Individual exercise prescription set using results from initial 6 min walk test and THRR while considering  patient's activity barriers and safety.   Exercise Prescription Goal: Initial exercise prescription builds to 30-45 minutes a day of aerobic activity, 2-3 days per week.  Home exercise guidelines will be given to  patient during program as part of exercise prescription that the participant will acknowledge.  Education: Aerobic Exercise: - Group verbal and visual presentation on the components of exercise prescription. Introduces F.I.T.T principle from ACSM  for exercise prescriptions.  Reviews F.I.T.T. principles of aerobic exercise including progression. Written material given at graduation.   Education: Resistance Exercise: - Group verbal and visual presentation on the components of exercise prescription. Introduces F.I.T.T principle from ACSM for exercise prescriptions  Reviews F.I.T.T. principles of resistance exercise including progression. Written material given at graduation. Flowsheet Row Pulmonary Rehab from 09/27/2022 in [facility] Cardiac and Pulmonary Rehab  Date 09/20/22  Educator KW  Instruction Review Code 1- [employer] Understanding        Education: Exercise & Equipment Safety: - Individual verbal instruction and demonstration of equipment use and safety with use of the equipment. Flowsheet Row Pulmonary Rehab from 09/27/2022 in [facility] Cardiac and Pulmonary Rehab  Date 07/10/22  Educator [facility]  Instruction Review Code 1- Verbalizes Understanding       Education: Exercise Physiology & General Exercise Guidelines: - Group verbal and written instruction with models to review the exercise physiology of the cardiovascular system and associated critical values. Provides general exercise guidelines with specific guidelines to those with heart or lung disease.  Flowsheet Row Pulmonary Rehab from 10/13/2020 in [facility] Cardiac and Pulmonary Rehab  Date 10/13/20  Educator [facility]  Instruction Review Code 1- Verbalizes Understanding       Education: Flexibility, Balance, Mind/Body Relaxation: - Group verbal and visual presentation with interactive activity on the components of exercise prescription. Introduces F.I.T.T principle from ACSM for exercise prescriptions. Reviews F.I.T.T. principles of  flexibility and balance exercise training including progression. Also discusses the mind body connection.  Reviews various relaxation techniques to help reduce and manage stress (i.e. Deep breathing, progressive muscle relaxation, and visualization). Balance handout provided to take home. Written material given at graduation. Flowsheet Row Pulmonary Rehab from 09/27/2022 in [facility] Cardiac and Pulmonary Rehab  Date 09/20/22  Educator KW  Instruction Review Code 1- Verbalizes Understanding       Activity Barriers & Risk Stratification:  Activity Barriers & Cardiac Risk Stratification - 06/28/22 1414       Activity Barriers & Cardiac Risk Stratification   Activity Barriers Shortness of Breath             6 Minute Walk:  6 Minute Walk     Row Name 07/10/22 1624         6 Minute Walk   Phase Initial     Distance 640 feet     Walk Time 5.25 minutes     # of Rest Breaks 3     MPH 1.38     METS 2.72     RPE 13     Perceived Dyspnea  3     VO2 Peak 9.51     Symptoms Yes (comment)     Comments slight lightheadedness     Resting HR 92 bpm     Resting BP 142/80     Resting Oxygen Saturation  96 %     Exercise Oxygen Saturation  during 6 min walk 88 %     Max Ex. HR 117 bpm     Max Ex. BP 188/82     2 Minute Post BP 142/80       Interval HR   1 Minute HR 100     2 Minute HR 104     3 Minute HR 110     4 Minute HR 100     5 Minute HR 106     6 Minute HR 117     Interval Heart Rate? Yes  Interval Oxygen   Interval Oxygen? Yes     Baseline Oxygen Saturation % 96 %     1 Minute Oxygen Saturation % 93 %     1 Minute Liters of Oxygen 3 L     2 Minute Oxygen Saturation % 92 %     2 Minute Liters of Oxygen 3 L     3 Minute Oxygen Saturation % 91 %     3 Minute Liters of Oxygen 3 L     4 Minute Oxygen Saturation % 90 %     4 Minute Liters of Oxygen 3 L     5 Minute Oxygen Saturation % 88 %     5 Minute Liters of Oxygen 3 L     6 Minute Oxygen Saturation % 92 %      6 Minute Liters of Oxygen 3 L     2 Minute Post Oxygen Saturation % 94 %     2 Minute Post Liters of Oxygen 3 L             Oxygen Initial Assessment:  Oxygen Initial Assessment - 09/20/22 1404       Home Oxygen   Home Oxygen Device Home Concentrator;Portable Concentrator    Sleep Oxygen Prescription BiPAP;Continuous    Liters per minute 3    Home Exercise Oxygen Prescription Continuous    Liters per minute 3    Home Resting Oxygen Prescription Continuous    Liters per minute 3    Compliance with Home Oxygen Use Yes      Initial 6 min Walk   Oxygen Used Continuous    Liters per minute 3      Program Oxygen Prescription   Program Oxygen Prescription E-Tanks;Continuous    Liters per minute 3      Intervention   Short Term Goals To learn and demonstrate proper use of respiratory medications;To learn and exhibit compliance with exercise, home and travel O2 prescription    Long  Term Goals Demonstrates proper use of MDI's;Exhibits compliance with exercise, home  and travel O2 prescription             Oxygen Re-Evaluation:  Oxygen Re-Evaluation     Row Name 07/12/22 1408 08/10/22 1356 08/14/22 1352 09/20/22 1404       Program Oxygen Prescription   Program Oxygen Prescription -- E-Tanks;Continuous E-Tanks;Continuous --    Liters per minute -- 3 3 --      Home Oxygen   Home Oxygen Device -- Home Concentrator;Portable Concentrator Home Concentrator;Portable Concentrator --    Sleep Oxygen Prescription -- BiPAP;Continuous BiPAP;Continuous --    Liters per minute -- 3 3 --    Home Exercise Oxygen Prescription -- Continuous Continuous --    Liters per minute -- 3 3 --    Home Resting Oxygen Prescription -- Continuous Continuous --    Liters per minute -- 3 3 --    Compliance with Home Oxygen Use -- Yes Yes --      Goals/Expected Outcomes   Short Term Goals To learn and exhibit compliance with exercise, home and travel O2 prescription;To learn and understand  importance of monitoring SPO2 with pulse oximeter and demonstrate accurate use of the pulse oximeter.;To learn and understand importance of maintaining oxygen saturations>88%;To learn and demonstrate proper pursed lip breathing techniques or other breathing techniques. ;To learn and demonstrate proper use of respiratory medications To learn and exhibit compliance with exercise, home and travel O2 prescription;To learn and understand importance  of monitoring SPO2 with pulse oximeter and demonstrate accurate use of the pulse oximeter.;To learn and understand importance of maintaining oxygen saturations>88%;To learn and demonstrate proper pursed lip breathing techniques or other breathing techniques. ;To learn and demonstrate proper use of respiratory medications To learn and demonstrate proper use of respiratory medications;To learn and exhibit compliance with exercise, home and travel O2 prescription --    Long  Term Goals Exhibits compliance with exercise, home  and travel O2 prescription;Verbalizes importance of monitoring SPO2 with pulse oximeter and return demonstration;Maintenance of O2 saturations>88%;Exhibits proper breathing techniques, such as pursed lip breathing or other method taught during program session;Compliance with respiratory medication;Demonstrates proper use of MDI's Exhibits compliance with exercise, home  and travel O2 prescription;Verbalizes importance of monitoring SPO2 with pulse oximeter and return demonstration;Maintenance of O2 saturations>88%;Exhibits proper breathing techniques, such as pursed lip breathing or other method taught during program session;Compliance with respiratory medication;Demonstrates proper use of MDI's Demonstrates proper use of MDI's;Exhibits compliance with exercise, home  and travel O2 prescription --    Comments Reviewed PLB technique with pt.  Talked about how it works and it's importance in maintaining their exercise saturations. Heidi [name] is doing well in  rehab.  She is doing better with her breathing and continues to work towards transplant.  She continues to work on PLB.  She is watching her saturations and exposures.  She conitnues to use her oxygen consistently. Heidi [name] is going through the requirements to get on a lung transplant list. She is changing her medications up and is following her pulmonologist. Heidi [name] switched off Xanax and started Buspar and believes her breathing as been improved since! She just got worked up for testing for her lung transplant at [facility] and will find out if she is qualified in the next couple of weeks. She still watches her HR & O2 and home which stays above 88% and she continues to use PLB whenever she feels SOB.    Goals/Expected Outcomes Short: Become more profiecient at using PLB.   Long: Become independent at using PLB. Short: Continue to use PLB  Long: Continue to monitor saturations. Short: continue working toward Lung Transplant list. Long: Receive a lung transplant. Short: Continue to watch O2 and take medications appropriately Long: Lung transplant if approved!             Oxygen Discharge (Final Oxygen Re-Evaluation):  Oxygen Re-Evaluation - 09/20/22 1404       Goals/Expected Outcomes   Comments Heidi [name] switched off Xanax and started Buspar and believes her breathing as been improved since! She just got worked up for testing for her lung transplant at [facility] and will find out if she is qualified in the next couple of weeks. She still watches her HR & O2 and home which stays above 88% and she continues to use PLB whenever she feels SOB.    Goals/Expected Outcomes Short: Continue to watch O2 and take medications appropriately Long: Lung transplant if approved!             Initial Exercise Prescription:  Initial Exercise Prescription - 07/10/22 1600       Date of Initial Exercise RX and Referring Provider   Date 07/10/22    Referring Provider Aleskerov      Oxygen   Oxygen Continuous    Liters 3     Maintain Oxygen Saturation 88% or higher      Treadmill   MPH 1    Grade 0    Minutes 15  METs 1.8      Recumbant Bike   Level 1    RPM 50    Minutes 15    METs 2.72      NuStep   Level 1    SPM 80    Minutes 15    METs 2.72      REL-XR   Level 1    Speed 50    Minutes 15    METs 2.72      T5 Nustep   Level 1    SPM 60    Minutes 15    METs 2.72      Biostep-RELP   Level 1    SPM 50    Minutes 15    METs 2.72      Track   Laps 15    Minutes 15    METs 1.82      Prescription Details   Frequency (times per week) 3    Duration Progress to 30 minutes of continuous aerobic without signs/symptoms of physical distress      Intensity   THRR 40-80% of Max Heartrate 116-141    Ratings of Perceived Exertion 11-13    Perceived Dyspnea 0-4      Progression   Progression Continue to progress workloads to maintain intensity without signs/symptoms of physical distress.      Resistance Training   Training Prescription Yes    Weight 3    Reps 10-15             Perform Capillary Blood Glucose checks as needed.  Exercise Prescription Changes:   Exercise Prescription Changes     Row Name 07/10/22 1600 07/31/22 1000 08/15/22 1500 08/29/22 1500 09/25/22 1600     Response to Exercise   Blood Pressure (Admit) 142/80 118/62 122/66 130/68 134/62   Blood Pressure (Exercise) 188/82 168/70 162/88 -- --   Blood Pressure (Exit) 142/80 134/72 112/66 138/72 140/62   Heart Rate (Admit) 92 bpm 95 bpm 99 bpm 100 bpm 107 bpm   Heart Rate (Exercise) 117 bpm 118 bpm 117 bpm 113 bpm 123 bpm   Heart Rate (Exit) 107 bpm 105 bpm 113 bpm 107 bpm 100 bpm   Oxygen Saturation (Admit) 96 % 98 % 93 % 99 % 93 %   Oxygen Saturation (Exercise) 88 % 90 % 90 % 91 % 88 %   Oxygen Saturation (Exit) 94 % 96 % 95 % 95 % 92 %   Rating of Perceived Exertion (Exercise) _0 Perceived Dyspnea (Exercise) _1 Symptoms slight lightheadedness SOB SOB SOB SOB   Comments 6  MWT results 2nd full week of exercise -- -- --   Duration -- Progress to 30 minutes of  aerobic without signs/symptoms of physical distress Continue with 30 min of aerobic exercise without signs/symptoms of physical distress. Continue with 30 min of aerobic exercise without signs/symptoms of physical distress. Continue with 30 min of aerobic exercise without signs/symptoms of physical distress.   Intensity -- THRR unchanged THRR unchanged THRR unchanged THRR unchanged     Progression   Progression -- Continue to progress workloads to maintain intensity without signs/symptoms of physical distress. Continue to progress workloads to maintain intensity without signs/symptoms of physical distress. Continue to progress workloads to maintain intensity without signs/symptoms of physical distress. Continue to progress workloads to maintain intensity without signs/symptoms of physical distress.   Average METs -- 2.18 2.32 2.22 1.59  Resistance Training   Training Prescription -- Yes Yes Yes Yes   Weight -- 3 lb 3 lb 3 lb 3 lb   Reps -- 10-15 10-15 10-15 10-15     Interval Training   Interval Training -- No No No No     Oxygen   Oxygen -- Continuous Continuous Continuous Continuous   Liters -- 2-_0 Treadmill   MPH -- 1.3 1.3 -- 1.3   Grade -- 0 0 -- 0   Minutes -- 15 15 -- 15   METs -- 2 2 -- 2     Recumbant Bike   Level -- _1 --   Minutes -- _2 --   METs -- -- 2.73 -- --     NuStep   Level -- 2 -- -- --   Minutes -- 15 -- -- --   METs -- 2 -- -- --     REL-XR   Level -- _3 Minutes -- _4 METs -- 3 3.1 3 1.2     T5 Nustep   Level -- -- 1 2 --   Minutes -- -- 15 30 --   METs -- -- 2 1.9 --     Oxygen   Maintain Oxygen Saturation -- 88% or higher 88% or higher 88% or higher 88% or higher    Row Name 10/12/22 1400             Response to Exercise   Blood Pressure (Admit) 132/72       Blood Pressure (Exit) 142/72       Heart Rate  (Admit) 101 bpm       Heart Rate (Exercise) 115 bpm       Heart Rate (Exit) 100 bpm       Oxygen Saturation (Admit) 98 %       Oxygen Saturation (Exercise) 92 %       Oxygen Saturation (Exit) 98 %       Rating of Perceived Exertion (Exercise) 14       Perceived Dyspnea (Exercise) 3       Symptoms SOB       Duration Continue with 30 min of aerobic exercise without signs/symptoms of physical distress.       Intensity THRR unchanged         Progression   Progression Continue to progress workloads to maintain intensity without signs/symptoms of physical distress.       Average METs 2.22         Resistance Training   Training Prescription Yes       Weight 3 lb       Reps 10-15         Interval Training   Interval Training No         Oxygen   Oxygen Continuous       Liters 4         Treadmill   MPH 1.3       Grade 0       Minutes 15       METs 1.99         Recumbant Bike   Level 3       Minutes 16       METs 2.75         REL-XR   Level 3       Minutes 15  METs 3         T5 Nustep   Level 3       Minutes 30       METs 1.9         Oxygen   Maintain Oxygen Saturation 88% or higher                Exercise Comments:   Exercise Goals and Review:   Exercise Goals     Row Name 07/10/22 1638             Exercise Goals   Increase Physical Activity Yes       Intervention Provide advice, education, support and counseling about physical activity/exercise needs.;Develop an individualized exercise prescription for aerobic and resistive training based on initial evaluation findings, risk stratification, comorbidities and participant's personal goals.       Expected Outcomes Short Term: Attend rehab on a regular basis to increase amount of physical activity.;Long Term: Add in home exercise to make exercise part of routine and to increase amount of physical activity.;Long Term: Exercising regularly at least 3-5 days a week.       Increase Strength and Stamina  Yes       Intervention Provide advice, education, support and counseling about physical activity/exercise needs.;Develop an individualized exercise prescription for aerobic and resistive training based on initial evaluation findings, risk stratification, comorbidities and participant's personal goals.       Expected Outcomes Short Term: Increase workloads from initial exercise prescription for resistance, speed, and METs.;Short Term: Perform resistance training exercises routinely during rehab and add in resistance training at home;Long Term: Improve cardiorespiratory fitness, muscular endurance and strength as measured by increased METs and functional capacity (6MWT)       Able to understand and use rate of perceived exertion (RPE) scale Yes       Intervention Provide education and explanation on how to use RPE scale       Expected Outcomes Short Term: Able to use RPE daily in rehab to express subjective intensity level;Long Term:  Able to use RPE to guide intensity level when exercising independently       Able to understand and use Dyspnea scale Yes       Intervention Provide education and explanation on how to use Dyspnea scale       Expected Outcomes Short Term: Able to use Dyspnea scale daily in rehab to express subjective sense of shortness of breath during exertion;Long Term: Able to use Dyspnea scale to guide intensity level when exercising independently       Knowledge and understanding of Target Heart Rate Range (THRR) Yes       Intervention Provide education and explanation of THRR including how the numbers were predicted and where they are located for reference       Expected Outcomes Short Term: Able to state/look up THRR;Short Term: Able to use daily as guideline for intensity in rehab;Long Term: Able to use THRR to govern intensity when exercising independently       Able to check pulse independently Yes       Intervention Provide education and demonstration on how to check pulse in  carotid and radial arteries.;Review the importance of being able to check your own pulse for safety during independent exercise       Expected Outcomes Short Term: Able to explain why pulse checking is important during independent exercise;Long Term: Able to check pulse independently and accurately       Understanding  of Exercise Prescription Yes       Intervention Provide education, explanation, and written materials on patient's individual exercise prescription       Expected Outcomes Short Term: Able to explain program exercise prescription;Long Term: Able to explain home exercise prescription to exercise independently                Exercise Goals Re-Evaluation :  Exercise Goals Re-Evaluation     Row Name 07/12/22 1407 07/31/22 1027 08/10/22 1345 08/15/22 1510 08/29/22 1554     Exercise Goal Re-Evaluation   Exercise Goals Review Increase Physical Activity;Increase Strength and Stamina;Able to understand and use rate of perceived exertion (RPE) scale;Able to understand and use Dyspnea scale;Knowledge and understanding of Target Heart Rate Range (THRR);Able to check pulse independently;Understanding of Exercise Prescription Increase Physical Activity;Increase Strength and Stamina;Understanding of Exercise Prescription Increase Physical Activity;Increase Strength and Stamina;Understanding of Exercise Prescription Increase Physical Activity;Increase Strength and Stamina;Understanding of Exercise Prescription Increase Physical Activity;Increase Strength and Stamina;Understanding of Exercise Prescription   Comments Reviewed RPE and dyspnea scales, THR and program prescription with pt today.  Pt voiced understanding and was given a copy of goals to take home. Heidi AspCindy is doing well in rehab for the first couple of weeks she has been here. She has increased to level 2 on all of her seated machines and also is able to walk at 1.3 mph on the treadmill. Her oxygen saturations are staying well above 88% each  time. We will continue to monitor as she is progressing in the program. Heidi AspCindy is doing well in rehab. She is using her Qubi and weights at home while she watches TV.  She is still working towards transplant.  She continues to build her strength up. Heidi AspCindy is doing well in rehab. She increased her overall average MET level to 2.32 METs. She also has tolerated a speed of 1.3 mph on the treadmill with no incline. She also increased to level 2 on the XR. We will continue to monitor his progress in the program. Heidi AspCindy continues to do well in rehab. She increased to level 3 on both the recumbent bike and XR and worked over 3 METS. She has stayed at a pretty consistent speed of 1.3 mph on the treadmill, however, she is challenged the most when walking. Staff will encourage a change in workload when appropriate. Will continue to monitor.   Expected Outcomes Short: Use RPE daily to regulate intensity. Long: Follow program prescription in THR. Short: Continue to increase workloads Long: Increase overall MET level Short: Continue to improve exercise on off days Long: Continue to improve stamina Short: Continue to increase speed on treadmill and add incline. Long: Continue to improve strength and stamina. Short: Increase speed on treadmill Long: Continue to increase overall MET level    Row Name 09/13/22 1339 09/20/22 1350 09/25/22 1630 10/12/22 1440 10/26/22 1352     Exercise Goal Re-Evaluation   Exercise Goals Review Increase Physical Activity;Increase Strength and Stamina;Understanding of Exercise Prescription Increase Physical Activity;Increase Strength and Stamina;Understanding of Exercise Prescription Increase Physical Activity;Increase Strength and Stamina;Understanding of Exercise Prescription Increase Physical Activity;Increase Strength and Stamina;Understanding of Exercise Prescription Increase Physical Activity;Increase Strength and Stamina;Understanding of Exercise Prescription   Comments Heidi AspCindy has not attended  rehab since the last two week evaluation. We will conitnue to monitor her progress in the program once she returns to rehab. Heidi AspCindy states she is doing exercises at home. She is pedaling on her recument leg ergometer- for about 20 minutes at a  time while she watches TV. Encouraged to increase that slowly to reach 30 minutes at home. She is checking her HR and O2 and vitals are stable. I encouraged her to incorporate some walking at home as she nees to meet certain criteria for a for her lung transplant. If she cannot do outside, she states she will plan to go to a grocery store to walk in a more climate control environment. She is also using 3lbs for handweights at home and is following a  senior exercise program on TV to do muscle training. Heidi [name] returned back from being out for a couple weeks as she was getting worked up for a lung transplant. She stayed consistent at 1.3 mph speed on the treadmill, she would benefit from increasing the speed or adding an incline. She continue back working at level 3 on XR. Will continue to monitor. Heidi [name] is doing well in rehab. She has continued to walk on the treadmill at a speed of 1.3 mph, and would benefit by increasing her workload. She also was able to tolerate the T5 at level 3 for 30 minutes. She increased her overall average MET level back up to 2.22 METs as well. We will continue to monitor her progress in the program. Heidi [name] has not been here since last review, as she has not attended since 12/4. We have attempted to contacted patient several times. Will continue to follow up and potentially discharge if we do not hear back.   Expected Outcomes Short: Return to regular attendance. Long: Continue to increase overall MET level Short: Incorporate walking into home exercise Long: Continue to exercise independently Short: Increase speed and/or incline on treadmill Long: Continue to build up strength, stamina, endurance, and overall MET level to meet demands for lung  transplant Short: Increase workload on treadmill. Long: Continue to build up strength, stamina, endurance, and overall MET level to meet demands for lung transplant. Short: Attend rehab consistently Long: Graduate from [employer]    Row Name 11/08/22 1618             Exercise Goal Re-Evaluation   Exercise Goals Review Increase Physical Activity;Increase Strength and Stamina;Understanding of Exercise Prescription       Comments Heidi [name] has not been here since last review, as she has not attended since 12/4. We have attempted to contacted patient several times. Will continue to follow up and potentially discharge if we do not hear back.       Expected Outcomes Short: Attend rehab consistently Long: Graduate from [employer] program                Discharge Exercise Prescription (Final Exercise Prescription Changes):  Exercise Prescription Changes - 10/12/22 1400       Response to Exercise   Blood Pressure (Admit) 132/72    Blood Pressure (Exit) 142/72    Heart Rate (Admit) 101 bpm    Heart Rate (Exercise) 115 bpm    Heart Rate (Exit) 100 bpm    Oxygen Saturation (Admit) 98 %    Oxygen Saturation (Exercise) 92 %    Oxygen Saturation (Exit) 98 %    Rating of Perceived Exertion (Exercise) 14    Perceived Dyspnea (Exercise) 3    Symptoms SOB    Duration Continue with 30 min of aerobic exercise without signs/symptoms of physical distress.    Intensity THRR unchanged      Progression   Progression Continue to progress workloads to maintain intensity without signs/symptoms of physical distress.  Average METs 2.22      Resistance Training   Training Prescription Yes    Weight 3 lb    Reps 10-15      Interval Training   Interval Training No      Oxygen   Oxygen Continuous    Liters 4      Treadmill   MPH 1.3    Grade 0    Minutes 15    METs 1.99      Recumbant Bike   Level 3    Minutes 16    METs 2.75      REL-XR   Level 3    Minutes 15    METs 3       T5 Nustep   Level 3    Minutes 30    METs 1.9      Oxygen   Maintain Oxygen Saturation 88% or higher             Nutrition:  Target Goals: Understanding of nutrition guidelines, daily intake of sodium 1500mg , cholesterol 200mg , calories 30% from fat and 7% or less from saturated fats, daily to have 5 or more servings of fruits and vegetables.  Education: All About Nutrition: -Group instruction provided by verbal, written material, interactive activities, discussions, models, and posters to present general guidelines for heart healthy nutrition including fat, fiber, MyPlate, the role of sodium in heart healthy nutrition, utilization of the nutrition label, and utilization of this knowledge for meal planning. Follow up email sent as well. Written material given at graduation. Flowsheet Row [facility]monary [facility]  Date 09/27/22  Educator [facility]  [part 2 09/27/22]  Instruction Review Code 1- Verbalizes Understanding       Biometrics:  Pre Biometrics - 07/10/22 1639       Pre Biometrics   Height 5\' 5"  (1.651 m)    Weight 151 lb 4.8 oz (68.6 kg)    Waist Circumference 43.5 inches    Hip Circumference 37 inches    Waist to Hip Ratio 1.18 %    BMI (Calculated) 25.18    Single Leg Stand 8.67 seconds              Nutrition Therapy Plan and Nutrition Goals:  Nutrition Therapy & Goals - 08/08/22 1305       Nutrition Therapy   Diet Heart healty, low Na, pulmonary MNT    Protein (specify units) 80-85g   1.2g/kg   Fiber 25 grams    Whole Grain Foods 3 servings    Saturated Fats 13 max. grams    Fruits and Vegetables 8 servings/day    Sodium 1.5 grams      Personal Nutrition Goals   Nutrition Goal ST: include at least 16 oz of water in between meals. add 2 snacks (mid-morning, mid-afternoon). include protein rich foods at meals and snacks: at least 1/2 protein shake with bagel breakfast, add nuts or peanut butter to greek yogurt,  add beans/lentils to rice, add greek yogurt or cannellini beans to potato.  LT: meet protein/energy needs, vary diet to meet nutrient needs: vary proteins and eat a rainbow of fruits/vegetables    Comments 67 y.o. 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 PMHx includes anxiety and depression, GERD, 3L of O2. PSHx includes cholecystectomy. Relevant medications includes xanax, lexapro, remeron, prednisone, vit D3. Heidi AspCindy reports having 2 small meals per day;  B: yogurt with applesauce (greek yogurt with fruit  in it) or sometimes bagel (blueberry bagel with cream cheese and honey) or premier protein.  L/D: she likes chicken and she will bake or airfry food, she likes beans like pinto beans, she likes rice and potatoes (baked), she likes green leafy vegetables, mixed frozen vegetables, broccoli, bell peppers. She uses olive oil when she cooks in a reusable spray bottle, she rarely salts her food - she likes pepper. S: snack on trail mix after dinner. Drinks: small amounts of water water, diet soda (1-2, 16oz). Heidi [name] reports being told before to increase her water intake - encouraged her to include more water during her day ideal in-between meals and only sipping on drinks to help wash down food. Discussed elevated protein and calorie needs due to COPD. Recommended including 2 snacks (mid-morning, mid-afternoon) with protein, healthy fats, and fiber; boiled eggs with whole grain crackers, cottage cheese with berries, 1/2 PB sandwich on whole wheat bread, bean salad, fruit with nuts/seeds or peanut butter, protein shake. Include protein rich foods at meals and snacks: at least 1/2 protein shake with bagel breakfast, add nuts or peanut butter to greek yogurt, add beans/lentils to rice, add greek yogurt or cannellini beans to potato. Reviewed general healthy eating and MyPlate as well as pulmonary MNT. Heidi [name] reports working with [facility] for a lung transplant and she will see their RD in  early November. Discussed how that RD may adjust protein or calorie amount and may have additional suggestions as they work with this population frequently.      Intervention Plan   Intervention Prescribe, educate and counsel regarding individualized specific dietary modifications aiming towards targeted core components such as weight, hypertension, lipid management, diabetes, heart failure and other comorbidities.    Expected Outcomes Short Term Goal: Understand basic principles of dietary content, such as calories, fat, sodium, cholesterol and nutrients.;Short Term Goal: A plan has been developed with personal nutrition goals set during dietitian appointment.;Long Term Goal: Adherence to prescribed nutrition plan.             Nutrition Assessments:  MEDIFICTS Score Key: ?70 Need to make dietary changes  40-70 Heart Healthy Diet ? 40 Therapeutic Level Cholesterol Diet  Flowsheet Row Pulmonary Rehab from 07/10/2022 in [facility] Cardiac and Pulmonary Rehab  Picture Your Plate Total Score on Admission 51      Picture Your Plate Scores: <16 Unhealthy dietary pattern with much room for improvement. 41-50 Dietary pattern unlikely to meet recommendations for good health and room for improvement. 51-60 More healthful dietary pattern, with some room for improvement.  >60 Healthy dietary pattern, although there may be some specific behaviors that could be improved.   Nutrition Goals Re-Evaluation:  Nutrition Goals Re-Evaluation     Row Name 09/20/22 1347             Goals   Nutrition Goal ST: include at least 16 oz of water in between meals. add 2 snacks (mid-morning, mid-afternoon). include protein rich foods at meals and snacks: at least 1/2 protein shake with bagel breakfast, add nuts or peanut butter to greek yogurt, add beans/lentils to rice, add greek yogurt or cannellini beans to potato.  LT: meet protein/energy needs, vary diet to meet nutrient needs: vary proteins and eat a rainbow  of fruits/vegetables       Comment Heidi [name] has not drank up to 16 oz of water in between but has been trying to increase her intake. She knows how much she would be drinking. She has added in more fruit like  oranges and apples as snacks, as well as real frozen fruit bars. She also had been eating more nuts as snack to help with her protein intake. Most of her protein sources are from pork, beef, and fish. She is  drinking Ensure almost daily, if not every other day to help with her protein as well. Encouraged to clarify how often she should be drinking them to help with overall pulmonary diet.       Expected Outcome Short: Continue to add in protein sources to diet, clarify how many ensure/week Long: Continue to meet protein needs and follow healthy Pulmonary based diet                Nutrition Goals Discharge (Final Nutrition Goals Re-Evaluation):  Nutrition Goals Re-Evaluation - 09/20/22 1347       Goals   Nutrition Goal ST: include at least 16 oz of water in between meals. add 2 snacks (mid-morning, mid-afternoon). include protein rich foods at meals and snacks: at least 1/2 protein shake with bagel breakfast, add nuts or peanut butter to greek yogurt, add beans/lentils to rice, add greek yogurt or cannellini beans to potato.  LT: meet protein/energy needs, vary diet to meet nutrient needs: vary proteins and eat a rainbow of fruits/vegetables    Comment Heidi [name] has not drank up to 16 oz of water in between but has been trying to increase her intake. She knows how much she would be drinking. She has added in more fruit like oranges and apples as snacks, as well as real frozen fruit bars. She also had been eating more nuts as snack to help with her protein intake. Most of her protein sources are from pork, beef, and fish. She is  drinking Ensure almost daily, if not every other day to help with her protein as well. Encouraged to clarify how often she should be drinking them to help with overall  pulmonary diet.    Expected Outcome Short: Continue to add in protein sources to diet, clarify how many ensure/week Long: Continue to meet protein needs and follow healthy Pulmonary based diet             Psychosocial: Target Goals: Acknowledge presence or absence of significant depression and/or stress, maximize coping skills, provide positive support system. Participant is able to verbalize types and ability to use techniques and skills needed for reducing stress and depression.   Education: Stress, Anxiety, and Depression - Group verbal and visual presentation to define topics covered.  Reviews how body is impacted by stress, anxiety, and depression.  Also discusses healthy ways to reduce stress and to treat/manage anxiety and depression.  Written material given at graduation. Flowsheet Row Pulmonary Rehab from 09/27/2022 in [facility] Cardiac and Pulmonary Rehab  Date 08/23/22  Educator [place]  Instruction Review Code 1- [employer] Understanding       Education: Sleep Hygiene -Provides group verbal and written instruction about how sleep can affect your health.  Define sleep hygiene, discuss sleep cycles and impact of sleep habits. Review good sleep hygiene tips.    Initial Review & Psychosocial Screening:  Initial Psych Review & Screening - 06/28/22 1417       Initial Review   Current issues with Current Depression;Current Psychotropic Meds;Current Stress Concerns      Family Dynamics   Good Support System? Yes   partner Laverna Peace, daughter  10 min away     Barriers   Psychosocial barriers to participate in program There are no identifiable barriers or psychosocial  needs.      Screening Interventions   Interventions Encouraged to exercise;To provide support and resources with identified psychosocial needs;Provide feedback about the scores to participant    Expected Outcomes Short Term goal: Utilizing psychosocial counselor, staff and physician to assist with identification of specific  Stressors or current issues interfering with healing process. Setting desired goal for each stressor or current issue identified.;Long Term Goal: Stressors or current issues are controlled or eliminated.;Short Term goal: Identification and review with participant of any Quality of Life or Depression concerns found by scoring the questionnaire.;Long Term goal: The participant improves quality of Life and PHQ9 Scores as seen by post scores and/or verbalization of changes             Quality of Life Scores:  Scores of 19 and below usually indicate a poorer quality of life in these areas.  A difference of  2-3 points is a clinically meaningful difference.  A difference of 2-3 points in the total score of the Quality of Life Index has been associated with significant improvement in overall quality of life, self-image, physical symptoms, and general health in studies assessing change in quality of life.  PHQ-9: Review Flowsheet       07/10/2022 08/16/2020 05/26/2016 02/15/2016  Depression screen PHQ 2/9  Decreased Interest 1 1 0 0  Down, Depressed, Hopeless 1 1 1  0  PHQ - 2 Score 2 2 1  0  Altered sleeping 1 1 3 1   Tired, decreased energy 1 1 1 1   Change in appetite 0 0 0 1  Feeling bad or failure about yourself  0 0 0 0  Trouble concentrating 0 0 0 0  Moving slowly or fidgety/restless 0 0 0 0  Suicidal thoughts 0 0 0 0  PHQ-9 Score 4 4 5 3   Difficult doing work/chores Not difficult at all Somewhat difficult Somewhat difficult Not difficult at all   Interpretation of Total Score  Total Score Depression Severity:  1-4 = Minimal depression, 5-9 = Mild depression, 10-14 = Moderate depression, 15-19 = Moderately severe depression, 20-27 = Severe depression   Psychosocial Evaluation and Intervention:  Psychosocial Evaluation - 06/28/22 1424       Psychosocial Evaluation & Interventions   Comments Heidi [name] has no barriers to attending the program. She lives with her partner,Heidi [name]. He ands her  daughter are her support.  Surie is taking Lexapro for mild depression. She is doing well with the med. She is in the process of a Lung transplant workup. So far, all is going well. Her goal this admission is to get to the exercise levels required by the transplant team to qualify for the transplant. This is a double lung transplant.  She is ready to attend the program. Work on the exercise requirements and build some strength and stamina, especially in her core.    Expected Outcomes STG Heidi [name] attends all scheduled sessions, she works with the EP to progress her exercise to the level required by her [facility] Transplant team. LTG Heidi [name] mets the requirements for her double lung transplant and she continues her exercise progression after her transplant.    Continue Psychosocial Services  Follow up required by staff             Psychosocial Re-Evaluation:  Psychosocial Re-Evaluation     Row Name 08/10/22 1349 09/20/22 1344           Psychosocial Re-Evaluation   Current issues with Current Anxiety/Panic;Current Stress Concerns Current Anxiety/Panic;Current Stress Concerns  Comments Heidi [name] is doing well mentally.  She is down to one xanax a day and working her way down.  She cannot be on any for transplant.  She is working with her doctor to find an alternative to help.  She is sleeping good and wears her BiPap most of the time until she wakes up. Heidi [name] switched off of  Xanax on 11/10 and her doctor switched her to Buspar. She has been feeling much better off of it  and even noted her breathing has improved as well. She is not allowed to be on Xanax when getting worked up for a lung transplant. Her bleep is good, and is stil wearing  her Bipap.Heidi [name] who is her significant other and daughter are her biggest supporters. She is waiting to hear back from [facility] on whether or not she is qualified for a lung transplant.      Expected Outcomes Short: conitnue to work on backing off xanax Long: conitnue to  improve sleep Short: Hear back on [facility] Long: Continue to utilize exercise for stress management and maintain posititve attitude      Interventions Encouraged to attend Pulmonary Rehabilitation for the exercise;Stress management education Encouraged to attend Pulmonary Rehabilitation for the exercise      Continue Psychosocial Services  Follow up required by staff Follow up required by staff               Psychosocial Discharge (Final Psychosocial Re-Evaluation):  Psychosocial Re-Evaluation - 09/20/22 1344       Psychosocial Re-Evaluation   Current issues with Current Anxiety/Panic;Current Stress Concerns    Comments Heidi [name] switched off of  Xanax on 11/10 and her doctor switched her to Buspar. She has been feeling much better off of it  and even noted her breathing has improved as well. She is not allowed to be on Xanax when getting worked up for a lung transplant. Her bleep is good, and is stil wearing  her Bipap.Heidi [name] who is her significant other and daughter are her biggest supporters. She is waiting to hear back from [facility] on whether or not she is qualified for a lung transplant.    Expected Outcomes Short: Hear back on [facility] Long: Continue to utilize exercise for stress management and maintain posititve attitude    Interventions Encouraged to attend Pulmonary Rehabilitation for the exercise    Continue Psychosocial Services  Follow up required by staff             Education: Education Goals: Education classes will be provided on a weekly basis, covering required topics. Participant will state understanding/return demonstration of topics presented.  Learning Barriers/Preferences:   General Pulmonary Education Topics:  Infection Prevention: - Provides verbal and written material to individual with discussion of infection control including proper hand washing and proper equipment cleaning during exercise session. Flowsheet Row Pulmonary Rehab from 09/27/2022 in [facility] Cardiac and  Pulmonary Rehab  Date 07/10/22  Educator [facility]  Instruction Review Code 1- Verbalizes Understanding       Falls Prevention: - Provides verbal and written material to individual with discussion of falls prevention and safety. Flowsheet Row Pulmonary Rehab from 09/27/2022 in [facility] Cardiac and Pulmonary Rehab  Date 07/10/22  Educator [facility]  Instruction Review Code 1- Verbalizes Understanding       Chronic Lung Disease Review: - Group verbal instruction with posters, models, PowerPoint presentations and videos,  to review new updates, new respiratory medications, new advancements in procedures and treatments. Providing information on websites and "800" numbers  for continued self-education. Includes information about supplement oxygen, available portable oxygen systems, continuous and intermittent flow rates, oxygen safety, concentrators, and Medicare reimbursement for oxygen. Explanation of Pulmonary Drugs, including class, frequency, complications, importance of spacers, rinsing mouth after steroid MDI's, and proper cleaning methods for nebulizers. Review of basic lung anatomy and physiology related to function, structure, and complications of lung disease. Review of risk factors. Discussion about methods for diagnosing sleep apnea and types of masks and machines for OSA. Includes a review of the use of types of environmental controls: home humidity, furnaces, filters, dust mite/pet prevention, HEPA vacuums. Discussion about weather changes, air quality and the benefits of nasal washing. Instruction on Warning signs, infection symptoms, calling MD promptly, preventive modes, and value of vaccinations. Review of effective airway clearance, coughing and/or vibration techniques. Emphasizing that all should Create an Action Plan. Written material given at graduation. Flowsheet Row [facility]  Date 09/29/20  [facility]  Instruction Review Code 1-  Verbalizes Understanding       AED/CPR: - Group verbal and written instruction with the use of models to demonstrate the basic use of the AED with the basic ABC's of resuscitation.    Anatomy and Cardiac Procedures: - Group verbal and visual presentation and models provide information about basic cardiac anatomy and function. Reviews the testing methods done to diagnose heart disease and the outcomes of the test results. Describes the treatment choices: Medical Management, Angioplasty, or Coronary Bypass Surgery for treating various heart conditions including Myocardial Infarction, Angina, Valve Disease, and Cardiac Arrhythmias.  Written material given at graduation. Flowsheet Row [facility]  Date 07/19/22  Educator SB  Instruction Review Code 1- Verbalizes Understanding       Medication Safety: - Group verbal and visual instruction to review commonly prescribed medications for heart and lung disease. Reviews the medication, class of the drug, and side effects. Includes the steps to properly store meds and maintain the prescription regimen.  Written material given at graduation. Flowsheet Row [facility]  Date 09/15/20  Educator [facility]  Instruction Review Code 1- Verbalizes Understanding       Other: -Provides group and verbal instruction on various topics (see comments)   Knowledge Questionnaire Score:  Knowledge Questionnaire Score - 07/10/22 1642       Knowledge Questionnaire Score   Pre Score 18/18              Core Components/Risk Factors/Patient Goals at Admission:  Personal Goals and Risk Factors at Admission - 07/10/22 1640       Core Components/Risk Factors/Patient Goals on Admission    Weight Management Yes    Intervention Weight Management: Develop a combined nutrition and exercise program designed to reach desired caloric intake, while  maintaining appropriate intake of nutrient and fiber, sodium and fats, and appropriate energy expenditure required for the weight goal.;Weight Management: Provide education and appropriate resources to help participant work on and attain dietary goals.    Admit Weight 151 lb 4.8 oz (68.6 kg)    Goal Weight: Short Term 150 lb (68 kg)    Goal Weight: Long Term 150 lb (68 kg)    Expected Outcomes Short Term: Continue to assess and modify interventions until short term weight is achieved;Long Term: Adherence to nutrition and physical activity/exercise program aimed toward attainment of established weight goal;Weight Maintenance: Understanding of the daily nutrition  guidelines, which includes 25-35% calories from fat, 7% or less cal from saturated fats, less than 200mg  cholesterol, less than 1.5gm of sodium, & 5 or more servings of fruits and vegetables daily;Understanding of distribution of calorie intake throughout the day with the consumption of 4-5 meals/snacks;Understanding recommendations for meals to include 15-35% energy as protein, 25-35% energy from fat, 35-60% energy from carbohydrates, less than 200mg  of dietary cholesterol, 20-35 gm of total fiber daily    Improve shortness of breath with ADL's Yes    Intervention Provide education, individualized exercise plan and daily activity instruction to help decrease symptoms of SOB with activities of daily living.    Expected Outcomes Short Term: Improve cardiorespiratory fitness to achieve a reduction of symptoms when performing ADLs;Long Term: Be able to perform more ADLs without symptoms or delay the onset of symptoms    Increase knowledge of respiratory medications and ability to use respiratory devices properly  Yes    Intervention Provide education and demonstration as needed of appropriate use of medications, inhalers, and oxygen therapy.    Expected Outcomes Short Term: Achieves understanding of medications use. Understands that oxygen is a  medication prescribed by physician. Demonstrates appropriate use of inhaler and oxygen therapy.;Long Term: Maintain appropriate use of medications, inhalers, and oxygen therapy.             Education:Diabetes - Individual verbal and written instruction to review signs/symptoms of diabetes, desired ranges of glucose level fasting, after meals and with exercise. Acknowledge that pre and post exercise glucose checks will be done for 3 sessions at entry of program.   Know Your Numbers and Heart Failure: - Group verbal and visual instruction to discuss disease risk factors for cardiac and pulmonary disease and treatment options.  Reviews associated critical values for Overweight/Obesity, Hypertension, Cholesterol, and Diabetes.  Discusses basics of heart failure: signs/symptoms and treatments.  Introduces Heart Failure Zone chart for action plan for heart failure.  Written material given at graduation.   Core Components/Risk Factors/Patient Goals Review:   Goals and Risk Factor Review     Row Name 08/10/22 1354 09/20/22 1343           Core Components/Risk Factors/Patient Goals Review   Personal Goals Review Weight Management/Obesity;Improve shortness of breath with ADL's;Increase knowledge of respiratory medications and ability to use respiratory devices properly. Weight Management/Obesity;Improve shortness of breath with ADL's;Increase knowledge of respiratory medications and ability to use respiratory devices properly.      Review Heidi [name] is doing well in rehab. She is holding steady on her weight and adding in protein.  She is working towards transplant. She is doing better with her breathing and able to do what she needs.  She continues to use inhalers as needed and uses her nebulizer twice a day. 09/22/22 reports that her breathing has actually improved after being removed off xanax. She cannot be on it if she ends up getting her lung transplant. She is continuing her breathing treatments and  respiratory medications appropriately. She realized she lost a couple pounds down to 147 lb, she was 150 lb before. She wants to lose weight but also talked about making sure she is not losing her muscle as she needs to stay as strong as she can for her possible surgery. Patient weighs herself at home almost daily and keeps aware. She knows to report any abnormal value.      Expected Outcomes Short: Conitnue to use nebulizer and maintain weight Long: Continue to work toward transplant Short: Continue  to keep a good eye on the weight Long: Continue to manage lifestyle risk factors               Core Components/Risk Factors/Patient Goals at Discharge (Final Review):   Goals and Risk Factor Review - 09/20/22 1343       Core Components/Risk Factors/Patient Goals Review   Personal Goals Review Weight Management/Obesity;Improve shortness of breath with ADL's;Increase knowledge of respiratory medications and ability to use respiratory devices properly.    Review Heidi [name] reports that her breathing has actually improved after being removed off xanax. She cannot be on it if she ends up getting her lung transplant. She is continuing her breathing treatments and respiratory medications appropriately. She realized she lost a couple pounds down to 147 lb, she was 150 lb before. She wants to lose weight but also talked about making sure she is not losing her muscle as she needs to stay as strong as she can for her possible surgery. Patient weighs herself at home almost daily and keeps aware. She knows to report any abnormal value.    Expected Outcomes Short: Continue to keep a good eye on the weight Long: Continue to manage lifestyle risk factors             ITP Comments:  ITP Comments     Row Name 06/28/22 1424 07/10/22 1623 07/12/22 1406 08/02/22 0703 08/08/22 1429   ITP Comments Virtual orientation call completed today. shehas an appointment on Date: 07/10/2022  for EP eval and gym Orientation.   Documentation of diagnosis [facility] found in [facility] Date: 05/03/2022 . Completed 6MWT and gym orientation. Initial ITP created and sent for review to Dr. Zetta Bills, Medical Director. First full day of exercise!  Patient was oriented to gym and equipment including functions, settings, policies, and procedures.  Patient's individual exercise prescription and treatment plan were reviewed.  All starting workloads were established based on the results of the 6 minute walk test done at initial orientation visit.  The plan for exercise progression was also introduced and progression will be customized based on patient's performance and goals. 30 Day review completed. Medical Director ITP review done, changes made as directed, and signed approval by Medical Director.    New to program Completed initial RD consultation    Row Name 08/30/22 0919 09/27/22 0752 10/24/22 1044 10/25/22 1004 10/26/22 1354   ITP Comments 30 Day review completed. Medical Director ITP review done, changes made as directed, and signed approval by Medical Director. 30 Day review completed. Medical Director ITP review done, changes made as directed, and signed approval by Medical Director. Heidi [name] has not attended since 10/02/22. We have not been able to review/update goals with her this round. 30 Day review completed. Medical Director ITP review done, changes made as directed, and signed approval by Medical Director. Attempted to call patient again, left another voicemail with no response back at this time. If no response back, will send letter to patient. Has not attended since 12/4.    [place] Name 10/31/22 1552 11/16/22 1322         ITP Comments Attempted to call patient again regarding Pulmonary Rehab. Has not attended since 12/4. Left 2nd voicemail no response back. Will send letter at this time. Heidi [name] has been out since 10/02/22.  Letter was sent and no return contact was received.  We will discharge her at this time.                Comments: Discharge  ITP

## 2022-11-16 NOTE — Progress Notes (Signed)
Discharge Summary: Heidi [name] 09-08-1956   [facility] discharged today from  rehab with 23 sessions completed.  Details of the patient's exercise prescription and what she needs to do in order to continue the prescription and progress were discussed with patient.  Patient was given a copy of prescription and goals.  Patient verbalized understanding. [name]  plans to continue to exercise by going to [facility].

## 2023-03-14 ENCOUNTER — Encounter (HOSPITAL_COMMUNITY): Payer: Self-pay

## 2023-03-14 ENCOUNTER — Encounter (HOSPITAL_COMMUNITY): Payer: 59 | Admitting: Psychiatry

## 2023-03-15 NOTE — Progress Notes (Signed)
Did not have for new eval.

## 2023-03-26 ENCOUNTER — Emergency Department: Payer: 59

## 2023-03-26 ENCOUNTER — Other Ambulatory Visit: Payer: Self-pay

## 2023-03-26 ENCOUNTER — Emergency Department
Admission: EM | Admit: 2023-03-26 | Discharge: 2023-03-26 | Disposition: A | Discharge status: Home / Self Care | Payer: 59 | Attending: Emergency Medicine | Admitting: Emergency Medicine

## 2023-03-26 DIAGNOSIS — J441 Chronic obstructive pulmonary disease with (acute) exacerbation: Secondary | ICD-10-CM | POA: Insufficient documentation

## 2023-03-26 DIAGNOSIS — Z9981 Dependence on supplemental oxygen: Secondary | ICD-10-CM | POA: Diagnosis not present

## 2023-03-26 DIAGNOSIS — R0602 Shortness of breath: Secondary | ICD-10-CM | POA: Diagnosis present

## 2023-03-26 LAB — CBC WITH DIFFERENTIAL/PLATELET
Abs Immature Granulocytes: 0.02 10*3/uL (ref 0.00–0.07)
Basophils Absolute: 0.1 10*3/uL (ref 0.0–0.1)
Basophils Relative: 1 %
Eosinophils Absolute: 0.4 10*3/uL (ref 0.0–0.5)
Eosinophils Relative: 5 %
HCT: 40.2 % (ref 36.0–46.0)
Hemoglobin: 12.9 g/dL (ref 12.0–15.0)
Immature Granulocytes: 0 %
Lymphocytes Relative: 16 %
Lymphs Abs: 1.3 10*3/uL (ref 0.7–4.0)
MCH: 30.9 pg (ref 26.0–34.0)
MCHC: 32.1 g/dL (ref 30.0–36.0)
MCV: 96.2 fL (ref 80.0–100.0)
Monocytes Absolute: 0.4 10*3/uL (ref 0.1–1.0)
Monocytes Relative: 5 %
Neutro Abs: 5.8 10*3/uL (ref 1.7–7.7)
Neutrophils Relative %: 73 %
Platelets: 291 10*3/uL (ref 150–400)
RBC: 4.18 MIL/uL (ref 3.87–5.11)
RDW: 13.6 % (ref 11.5–15.5)
WBC: 7.9 10*3/uL (ref 4.0–10.5)
nRBC: 0 % (ref 0.0–0.2)

## 2023-03-26 LAB — BASIC METABOLIC PANEL
Anion gap: 9 (ref 5–15)
BUN: 13 mg/dL (ref 8–23)
CO2: 31 mmol/L (ref 22–32)
Calcium: 9.4 mg/dL (ref 8.9–10.3)
Chloride: 99 mmol/L (ref 98–111)
Creatinine, Ser: 0.85 mg/dL (ref 0.44–1.00)
GFR, Estimated: >60 mL/min (ref >60)
Glucose, Bld: 114 mg/dL — ABNORMAL HIGH (ref 70–99)
Potassium: 4 mmol/L (ref 3.5–5.1)
Sodium: 139 mmol/L (ref 135–145)

## 2023-03-26 LAB — D-DIMER, QUANTITATIVE: D-Dimer, Quant: 0.85 ug/mL-FEU — ABNORMAL HIGH (ref 0.00–0.50)

## 2023-03-26 MED ORDER — PREDNISONE 20 MG PO TABS: 40.0000 mg | ORAL_TABLET | Freq: Every day | ORAL | 0 refills | Status: AC

## 2023-03-26 MED ORDER — AZITHROMYCIN 500 MG PO TABS: 500.0000 mg | ORAL_TABLET | Freq: Every day | ORAL | 0 refills | Status: AC

## 2023-03-26 MED ORDER — METHYLPREDNISOLONE SODIUM SUCC 125 MG IJ SOLR
125.0000 mg | Freq: Once | INTRAMUSCULAR | Status: AC
  Administered 2023-03-26: 125 mg via INTRAVENOUS
  Filled 2023-03-26: qty 2

## 2023-03-26 MED ORDER — IPRATROPIUM-ALBUTEROL 0.5-2.5 (3) MG/3ML IN SOLN
6.0000 mL | Freq: Once | RESPIRATORY_TRACT | Status: AC
  Administered 2023-03-26: 6 mL via RESPIRATORY_TRACT
  Filled 2023-03-26: qty 3

## 2023-03-26 MED ORDER — IOHEXOL 350 MG/ML SOLN
75.0000 mL | Freq: Once | INTRAVENOUS | Status: AC | PRN
  Administered 2023-03-26: 75 mL via INTRAVENOUS

## 2023-03-26 NOTE — ED Triage Notes (Signed)
Pt c/o SOB x2-3 days, pt wears 3L O2 chronically for COPD. VS stable in triage

## 2023-03-26 NOTE — ED Notes (Signed)
Patient transported to CT 

## 2023-03-26 NOTE — Discharge Instructions (Addendum)
You are seen in the emergency room today for evaluation of your difficulty breathing.  Your blood work, EKG, x-Eily Louvier, and CT scan reassuring.  You do have severe emphysema, but there was no evidence of pneumonia or blood clot in your lung.  I suspect that she likely have a COPD flare.  I sent a prescription for a steroid as well as an antibiotic to your pharmacy.  Please take these as directed.  You can continue to use your home nebulizer as needed.  If you have any new or worsening symptoms, please return to the ER for further evaluation.  Otherwise, please follow-up with your primary care doctor or pulmonologist within the next few days for reevaluation.

## 2023-03-26 NOTE — ED Provider Notes (Signed)
[facility] Provider Note    Event Date/Time   First MD Initiated Contact with Patient 03/26/23 1256     (approximate)   History   Shortness of Breath   HPI  Heidi [name] is a 67 y.o. female with history of COPD on 3 L home oxygen presenting to the emerged part for evaluation of shortness of breath.  Patient reports about 3 days ago she had worsening cough and shortness of breath.  Reports that she feels wheezy.  Has been using her home nebulizer treatments with limited benefit.  No fever.  Has not been coughing up anything.  No known sick contacts.     Physical Exam   Triage Vital Signs: ED Triage Vitals  Enc Vitals Group     BP 03/26/23 1254 (!) 198/89     Pulse Rate 03/26/23 1254 (!) 103     Resp 03/26/23 1254 (!) 25     Temp 03/26/23 1254 98.8 F (37.1 C)     Temp Source 03/26/23 1254 Oral     SpO2 03/26/23 1254 98 %     Weight 03/26/23 1255 143 lb (64.9 kg)     Height 03/26/23 1255 5\' 5"  (1.651 m)     Head Circumference --      Peak Flow --      Pain Score 03/26/23 1254 0     Pain Loc --      Pain Edu? --      Excl. in GC? --     Most recent vital signs: Vitals:   03/26/23 1254 03/26/23 1300  BP: (!) 198/89 (!) 167/83  Pulse: (!) 103 (!) 101  Resp: (!) 25 20  Temp: 98.8 F (37.1 C)   SpO2: 98% 99%     General: Awake, interactive  CV:  Mild tachycardia with regular rhythm, normal peripheral perfusion Resp:  Lung sounds diminished with occasional expiratory wheezing, mildly labored respirations, frequent coughing  Abd:  Soft, nondistended.  Neuro:  Symmetric facial movement, fluid speech   ED Results / Procedures / Treatments   Labs (all labs ordered are listed, but only abnormal results are displayed) Labs Reviewed  BASIC METABOLIC PANEL - Abnormal; Notable for the following components:      Result Value   Glucose, Bld 114 (*)    All other components within normal limits  D-DIMER, QUANTITATIVE - Abnormal; Notable  for the following components:   D-Dimer, Quant 0.85 (*)    All other components within normal limits  CBC WITH DIFFERENTIAL/PLATELET     EKG EKG independently reviewed interpreted by myself (ER attending) demonstrates:  EKG demonstrates sinus rhythm at a rate of 94, PR 141, QRS 87, QTc 431, no acute ST changes  RADIOLOGY Imaging independently reviewed and interpreted by myself demonstrates:  CXR without focal pneumonia or pneumothorax CTA chest without evidence of PE or pneumonia  PROCEDURES:  Critical Care performed: No  Procedures   MEDICATIONS ORDERED IN ED: Medications  ipratropium-albuterol (DUONEB) 0.5-2.5 (3) MG/3ML nebulizer solution 6 mL (6 mLs Nebulization Given 03/26/23 1328)  methylPREDNISolone sodium succinate (SOLU-MEDROL) 125 mg/2 mL injection 125 mg (125 mg Intravenous Given 03/26/23 1326)  iohexol (OMNIPAQUE) 350 MG/ML injection 75 mL (75 mLs Intravenous Contrast Given 03/26/23 1415)     IMPRESSION / MDM / ASSESSMENT AND PLAN / ED COURSE  I reviewed the triage vital signs and the nursing notes.  Differential diagnosis includes, but is not limited to, COPD exacerbation, pneumonia, pulmonary embolism, viral illness  Patient's presentation is most consistent with acute presentation with potential threat to life or bodily function.  67 year old female with known COPD presenting with shortness of breath.  Here, satting in the upper 90s on her home oxygen at the time of my evaluation, but does have mildly labored respirations with some expiratory wheezing.  Will obtain lab work, treat with DuoNebs and steroids, and reevaluate.  D-dimer did return elevated, will obtain CT of the chest to further evaluate. Remainder of lab work without significant derangement.  CTA did return without evidence of PE.  Demonstrated severe emphysema, no acute pneumonia.  Patient reevaluated following the above.  She reports feeling significantly improved.  Her sats remained in the upper 90s  on her home oxygen.  Her work of breathing is significantly improved.  No appreciable ongoing wheezing.  Do think she is stable for discharge home with close outpatient follow-up and strict return precautions.  Will DC with prescription for increased dose of prednisone.  With her significantly increased cough, do think it is reasonable to start her on antibiotics in the setting of her COPD exacerbation.  Does report that azithromycin is usually most effective for her so I did order a 3-day course of this.  Patient was discharged stable condition.      FINAL CLINICAL IMPRESSION(S) / ED DIAGNOSES   Final diagnoses:  COPD exacerbation (HCC)     Rx / DC Orders   ED Discharge Orders          Ordered    azithromycin (ZITHROMAX) 500 MG tablet  Daily        03/26/23 1530    predniSONE (DELTASONE) 20 MG tablet  Daily with breakfast        03/26/23 1530             Note:  This document was prepared using Dragon voice recognition software and may include unintentional dictation errors.   Trinna Post, MD 03/26/23 [phone]

## 2023-04-11 ENCOUNTER — Encounter (HOSPITAL_COMMUNITY): Payer: Self-pay | Admitting: Psychiatry

## 2023-04-11 ENCOUNTER — Ambulatory Visit (HOSPITAL_BASED_OUTPATIENT_CLINIC_OR_DEPARTMENT_OTHER): Payer: 59 | Admitting: Psychiatry

## 2023-04-11 VITALS — Wt 143.0 lb

## 2023-04-11 DIAGNOSIS — F410 Panic disorder [episodic paroxysmal anxiety] without agoraphobia: Secondary | ICD-10-CM

## 2023-04-11 DIAGNOSIS — F064 Anxiety disorder due to known physiological condition: Secondary | ICD-10-CM | POA: Diagnosis not present

## 2023-04-11 NOTE — Progress Notes (Signed)
Psychiatric Initial Adult Assessment    Virtual Visit via Video Note  I connected with Heidi [name] on 04/11/23 at  1:00 PM EDT by a video enabled telemedicine application and verified that I am speaking with the correct person using two identifiers.  Location: Patient: Home Provider: Home Office   I discussed the limitations of evaluation and management by telemedicine and the availability of in person appointments. The patient expressed understanding and agreed to proceed.   Patient Identification: Heidi [name] MRN:  [id number] Date of Evaluation:  04/11/2023 Referral Source: Dr Aleskerov/PCP Chief Complaint:   Chief Complaint  Patient presents with   Establish Care   Depression   Anxiety   Visit Diagnosis:    ICD-10-CM   1. Panic attack  F41.0     2. 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  Patient told her doctors wants to know if she can come off from psych medication.  She is taking Xanax 0.25 mg twice a day, BuSpar 10 mg 2 times a day, mirtazapine prescribed 30 mg to take half tablet and Lexapro 20 mg daily.  She has been taking this medication for the past few years.  Patient has chronic COPD with stage IV.  Last year she was in the process of getting lung transplant but reported it was very overwhelming and she was told to cut down and stop Xanax.  She was without the Xanax for 2 months but started to have severe anxiety, panic attack, nervousness and she decided not to pursue further lung transplant.  She uses nasal oxygen.  She reported despite taking the medication occasionally she still have panic attacks.  She recall most of the time when she has to go out her house.  She gets anxious and worried what will happen to her breathing in a public place.   Patient reported these panic attacks are sporadic but she believes the current medicine is keeping her stable.  She also reported decreased energy, lack of motivation which is due to her chronic hypoxia.  She denies any phobia, paranoia, hallucination, suicidal thoughts.  She denies any feeling of hopelessness or worthlessness.  She denies any crying spells, anhedonia, irritability, anger.  She admitted during 2-3 beers every Friday when she goes to [employer].  She denies intoxication, blackouts, withdrawals, seizures or any history of DUI.  She lives with her partner for past 15 years.  She is a 10 year old daughter who lives 10 minutes away and 41 year old son who is 20 minutes away.  She is very close to her 2 grandsons.  She had a good support system.  She denies any tremors or shakes or any EPS.  She did not recall having anxiety or panic attack before she was given the diagnosis of COPD.  She has no history of suicidal attempt or any psychiatric inpatient treatment.  She denies any significant weight gain or weight loss.  Associated Signs/Symptoms: Depression Symptoms:  difficulty concentrating, anxiety, panic attacks, loss of energy/fatigue, (Hypo) Manic Symptoms:   none Anxiety Symptoms:  Excessive Worry, Panic Symptoms, Social Anxiety, Psychotic Symptoms:   none PTSD Symptoms: Negative  Past Psychiatric History: No history of suicidal attempt, inpatient treatment, psychosis, mania, legal issues, drug use.  Has tried Prozac, Wellbutrin and Zoloft but had side effects.  Never seen psychiatrist before.  She is taking Xanax, Lexapro and mirtazapine for past few years.  Recently BuSpar added by pulmonologist.  Previous Psychotropic Medications: Yes   Substance Abuse History in the last 12 months:  No.  Consequences of Substance Abuse: NA  Past Medical History:  Past Medical History:  Diagnosis Date   Anxiety    COPD (chronic obstructive pulmonary disease) (HCC)    GERD  (gastroesophageal reflux disease)    Hypotension    limiting med titration   NSTEMI (non-ST elevated myocardial infarction) (HCC) 2013   12/2011 with normal coronaries possibly secondary to Takotsubo (EF 30-35% by echo), occurred following two episodes of acute respiratory distress   Pneumonia 11/2016   history of   Takotsubo syndrome 4/13    Past Surgical History:  Procedure Laterality Date   ABDOMINAL HYSTERECTOMY     CARDIAC CATHETERIZATION     CHOLECYSTECTOMY N/A 06/05/2017   Procedure: LAPAROSCOPIC CHOLECYSTECTOMY;  Surgeon: Nadeen Landau, MD;  Location: [facility] ORS;  Service: General;  Laterality: N/A;   FRACTURE SURGERY Left 07/2016   leg compound fraction   LEFT HEART CATHETERIZATION WITH CORONARY ANGIOGRAM N/A 01/23/2012   Procedure: LEFT HEART CATHETERIZATION WITH CORONARY ANGIOGRAM;  Surgeon: Dolores Patty, MD;  Location: [facility] CATH LAB;  Service: Cardiovascular;  Laterality: N/A;   vagina rectal repair     after childbirth    Family Psychiatric History: Reviewed.  Family History:  Family History  Problem Relation Age of Onset   Heart attack Mother        deceased from massive MI at age 76   Liver disease Father        fatty liver; living, age 52   Stroke Brother        at age 58, living    Hypertension Brother        living, age 38    Social History:   Social History   Socioeconomic History   Marital status: Divorced    Spouse name: Not on file   Number of children: 2   Years of education: Not on file   Highest education level: High school graduate  Occupational History   Occupation: Airline pilot: [employer]    Comment: [employer]  Tobacco Use   Smoking status: Former    Packs/day: 1.00    Years: 30.00    Additional pack years: 0.00    Total pack years: 30.00    Types: Cigarettes    Quit date: 01/21/2012    Years since quitting: 11.2   Smokeless tobacco: Never  Vaping Use   Vaping Use: Never used  Substance and Sexual Activity   Alcohol  use: Yes    Comment: 1-2 beers per week   Drug use: No   Sexual activity: Yes    Birth control/protection: Surgical  Other Topics Concern   Not on file  Social History Narrative   Ms. 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 She has two children, a daughter and son, who live in [place] area. She lives with her brother.   Social Determinants of Health   Financial Resource Strain: Low Risk  (10/16/2018)   Overall Financial Resource Strain (CARDIA)    Difficulty of Paying Living Expenses: Not hard at all  Food Insecurity: No Food Insecurity (10/16/2018)   Hunger Vital Sign    Worried About Running Out of Food in the Last Year: Never true    Ran Out of Food  in the Last Year: Never true  Transportation Needs: No Transportation Needs (10/16/2018)   PRAPARE - Administrator, Civil Service (Medical): No    Lack of Transportation (Non-Medical): No  Physical Activity: Inactive (10/16/2018)   Exercise Vital Sign    Days of Exercise per Week: 0 days    Minutes of Exercise per Session: 0 min  Stress: Stress Concern Present (10/16/2018)   [employer] of Occupational Health - Occupational Stress Questionnaire    Feeling of Stress : Rather much  Social Connections: Unknown (10/16/2018)   Social Connection and Isolation Panel [NHANES]    Frequency of Communication with Friends and Family: Not on file    Frequency of Social Gatherings with Friends and Family: Not on file    Attends Religious Services: Never    Active Member of Clubs or Organizations: Yes    Attends Engineer, structural: More than 4 times per year    Marital Status: Divorced    Additional Social History: Patient is divorced.  She lives with her partner for the past 15 years who is very supportive.  Patient has a 32 year old daughter who lives 10 minutes away and 43 year old son who lives continuously.  She has 2 grandchildren and she is very close to them.  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  In 2013 she retired due to unable to work because of breathing issues.  She has a history of smoking.  Allergies:   Allergies  Allergen Reactions   Prozac [Fluoxetine] Itching   Budesonide     Other reaction(s): Dizziness (per pt: still takes this medication)   Buspar [Buspirone] Other (See Comments)    Ache joints   Levaquin [Levofloxacin In D5w] Hives   Propranolol Other (See Comments)    dizziness   Zoloft [Sertraline Hcl] Other (See Comments)    Increased anxiety    Metabolic Disorder Labs: Lab Results  Component Value Date   HGBA1C 5.8 (H) 11/05/2020   MPG 119.76 11/05/2020   MPG 114 01/23/2012   No results found for: "PROLACTIN" Lab Results  Component Value Date   CHOL 134 01/24/2012   TRIG 65 01/24/2012   HDL 80 01/24/2012   CHOLHDL 1.7 01/24/2012   VLDL 13 01/24/2012   LDLCALC 41 01/24/2012   LDLCALC 54 07/12/2009   Lab Results  Component Value Date   TSH 0.331 (L) 01/23/2012    Therapeutic Level Labs: No results found for: "LITHIUM" No results found for: "CBMZ" No results found for: "VALPROATE"  Current Medications: Current Outpatient Medications  Medication Sig Dispense Refill   albuterol (PROVENTIL HFA;VENTOLIN HFA) 108 (90 Base) MCG/ACT inhaler Inhale 1-2 puffs into the lungs every 4 (four) hours as needed for wheezing or shortness of breath.     albuterol (PROVENTIL) (2.5 MG/3ML) 0.083% nebulizer solution Take 3 mLs (2.5 mg total) by nebulization every 6 (six) hours as needed for wheezing or shortness of breath. 360 mL 5   ALPRAZolam (XANAX) 0.25 MG tablet Take 0.25 mg by mouth 2 (two) times daily.     azithromycin (ZITHROMAX) 250 MG tablet Take by mouth.     DUPIXENT 300 MG/2ML SOPN Inject into the skin.     EPINEPHrine 0.3 mg/0.3 mL IJ SOAJ injection Inject into the muscle.     escitalopram (LEXAPRO) 20 MG tablet Take 20 mg by mouth daily.     fluticasone (FLONASE) 50 MCG/ACT nasal spray  USE 2 SPRAYS IN EACH NOSTRIL  ONCE DAILY 16 g 5   guaiFENesin-codeine 100-10 MG/5ML syrup Take 5 mLs by mouth every 6 (six) hours as needed for cough. 118 mL 0   Ipratropium-Albuterol (COMBIVENT RESPIMAT) 20-100 MCG/ACT AERS respimat INHALE 1 PUFF BY MOUTH EVERY 6 HOURS AS NEEDED FOR WHEEZING 4 g 5   ipratropium-albuterol (DUONEB) 0.5-2.5 (3) MG/3ML SOLN Take 3 mLs by nebulization every 4 (four) hours as needed. 360 mL 2   mirtazapine (REMERON) 30 MG tablet Take 30 mg by mouth at bedtime.     montelukast (SINGULAIR) 10 MG tablet Take 10 mg by mouth at bedtime.     mupirocin ointment (BACTROBAN) 2 % Apply 1 application topically daily. Apply to wound QD until healed. (Patient not taking: Reported on 06/28/2022) 22 g 0   predniSONE (DELTASONE) 5 MG tablet Take 5 mg by mouth daily.     TRELEGY ELLIPTA 100-62.5-25 MCG/INH AEPB INHALE 1 PUFF ONCE DAILY -  NEED  APPT  FOR  FURTHER  REFILLS 60 each 1   No current facility-administered medications for this visit.    Musculoskeletal: Strength & Muscle Tone: within normal limits Gait & Station: normal Patient leans: N/A  Psychiatric Specialty Exam: Review of Systems  Constitutional:  Positive for fatigue.  Respiratory:  Positive for shortness of breath.        On nasal oxyen  Psychiatric/Behavioral:  Positive for sleep disturbance. The patient is nervous/anxious.     Weight 143 lb (64.9 kg).There is no height or weight on file to calculate BMI.  General Appearance: Casual and on nasal oxygen  Eye Contact:  Fair  Speech:  Slow  Volume:  Decreased  Mood:  Anxious and Dysphoric  Affect:  Congruent  Thought Process:  Goal Directed  Orientation:  Full (Time, Place, and Person)  Thought Content:  Rumination  Suicidal Thoughts:  No  Homicidal Thoughts:  No  Memory:  Immediate;   Good Recent;   Good Remote;   Good  Judgement:  Intact  Insight:  Fair  Psychomotor Activity:  Decreased  Concentration:  Concentration: Fair and Attention Span: Fair   Recall:  Good  Fund of Knowledge:Fair  Language: Good  Akathisia:  No  Handed:  Right  AIMS (if indicated):  not done  Assets:  Communication Skills Desire for Improvement Housing Social Support Transportation  ADL's:  Intact  Cognition: WNL  Sleep:  Fair   Screenings: PHQ2-9    Flowsheet Row Office Visit from 04/11/2023 in [facility] from 07/10/2022 in [facility] Pulmonary Rehab from 08/16/2020 in [facility] Cardiac and Pulmonary Rehab Pulmonary Rehab from 05/26/2016 in [facility] Cardiac and Pulmonary Rehab Pulmonary Rehab from 02/15/2016 in [facility] Cardiac and Pulmonary Rehab  PHQ-2 Total Score 2 2 2 1  0  PHQ-9 Total Score 3 4 4 5 3       Flowsheet Row Office Visit from 04/11/2023 in [facility] ED from 03/26/2023 in [facility] Emergency Department [facility] ED from 08/20/2021 in [facility] Emergency Department at [facility]  C-SSRS RISK CATEGORY No Risk No Risk No Risk       Assessment and Plan: Patient is a 67 year old Caucasian female with history of panic attacks and anxiety mostly due to her general medical condition.  Her current medicine is Xanax 0.25 mg twice a day, mirtazapine taking only 50 mg at bedtime, BuSpar 10 mg twice a day and Lexapro 20 mg daily.  Patient reported her anxiety is unstable and  she is doing well.  She is not interested to pursue lung transplant as process is very tedious.  She tried to come off from Xanax and had significant anxiety and increased panic attack.  She does not feel the BuSpar doing a lot to help her anxiety.  She like to come off which I agree as patient is taking multiple medication to address the anxiety.  I discussed benzodiazepine dependence tolerance and withdrawal.  I recommend other alternatives are Klonopin, lorazepam, clorazepate but again they are benzodiazepine she had tried Prozac, Wellbutrin and Zoloft but  had side effects.  Patient not interested to try a different medication since symptoms are stable and she does not want to do a lung transplant.  I suggested we consider therapy to help her panic attacks.  Patient agree to consider and we will refer her to see therapist.  All her medication as prescribed by her provider.  Patient does not need to see a psychiatrist for medication management.  However if she agree to try a different medication we will be happy to see her again in our office.  No follow-up needed.  Collaboration of Care: Other provider involved in patient's care AEB notes are available in epic to review.  Patient/Guardian was advised Release of Information must be obtained prior to any record release in order to collaborate their care with an outside provider. Patient/Guardian was advised if they have not already done so to contact the registration department to sign all necessary forms in order for [place] to release information regarding their care.   Consent: Patient/Guardian gives verbal consent for treatment and assignment of benefits for services provided during this visit. Patient/Guardian expressed understanding and agreed to proceed.    Follow Up Instructions:    I discussed the assessment and treatment plan with the patient. The patient was provided an opportunity to ask questions and all were answered. The patient agreed with the plan and demonstrated an understanding of the instructions.   The patient was advised to call back or seek an in-person evaluation if the symptoms worsen or if the condition fails to improve as anticipated.  I provided 52 minutes of non-face-to-face time during this encounter.  Cleotis Nipper, MD 6/12/20241:01 PM

## 2023-06-26 ENCOUNTER — Other Ambulatory Visit: Payer: Self-pay

## 2023-06-26 ENCOUNTER — Emergency Department: Payer: 59

## 2023-06-26 ENCOUNTER — Emergency Department
Admission: EM | Admit: 2023-06-26 | Discharge: 2023-06-26 | Disposition: A | Discharge status: Home / Self Care | Payer: 59 | Attending: Emergency Medicine | Admitting: Emergency Medicine

## 2023-06-26 DIAGNOSIS — Z1152 Encounter for screening for COVID-19: Secondary | ICD-10-CM | POA: Diagnosis not present

## 2023-06-26 DIAGNOSIS — R0602 Shortness of breath: Secondary | ICD-10-CM | POA: Diagnosis present

## 2023-06-26 DIAGNOSIS — J441 Chronic obstructive pulmonary disease with (acute) exacerbation: Secondary | ICD-10-CM | POA: Diagnosis not present

## 2023-06-26 LAB — RESP PANEL BY RT-PCR (RSV, FLU A&B, COVID)  RVPGX2
Influenza A by PCR: NEGATIVE
Influenza B by PCR: NEGATIVE
Resp Syncytial Virus by PCR: NEGATIVE
SARS Coronavirus 2 by RT PCR: NEGATIVE

## 2023-06-26 MED ORDER — METHYLPREDNISOLONE 4 MG PO TBPK: ORAL_TABLET | ORAL | 0 refills | Status: DC

## 2023-06-26 MED ORDER — ALBUTEROL SULFATE (2.5 MG/3ML) 0.083% IN NEBU: 3.0000 mL | INHALATION_SOLUTION | RESPIRATORY_TRACT | Status: DC | PRN

## 2023-06-26 NOTE — ED Provider Notes (Signed)
[facility] Provider Note   Event Date/Time   First MD Initiated Contact with Patient 06/26/23 1213     (approximate) History  Shortness of Breath  HPI Heidi [name] is a 67 y.o. female with a stated past medical history of stage IV COPD on 3 L nasal cannula at baseline who presents complaining of shortness of breath that has been worsening over the last 3 days.  Patient denies any recent travel or sick contacts.  Patient denies any medication changes.  Patient states that she has been on a once daily inhaler that has not been changed for the past few months.  The symptoms are similar to previous COPD exacerbations she has had in the past ROS: Patient currently denies any vision changes, tinnitus, difficulty speaking, facial droop, sore throat, chest pain, abdominal pain, nausea/vomiting/diarrhea, dysuria, or weakness/numbness/paresthesias in any extremity   Physical Exam  Triage Vital Signs: ED Triage Vitals  Encounter Vitals Group     BP 06/26/23 1155 (!) 166/78     Systolic BP Percentile --      Diastolic BP Percentile --      Pulse Rate 06/26/23 1153 (!) 105     Resp 06/26/23 1153 (!) 22     Temp 06/26/23 1153 98.6 F (37 C)     Temp Source 06/26/23 1153 Oral     SpO2 06/26/23 1153 97 %     Weight 06/26/23 1150 145 lb (65.8 kg)     Height 06/26/23 1150 5\' 5"  (1.651 m)     Head Circumference --      Peak Flow --      Pain Score 06/26/23 1149 4     Pain Loc --      Pain Education --      Exclude from Growth Chart --    Most recent vital signs: Vitals:   06/26/23 1153 06/26/23 1155  BP:  (!) 166/78  Pulse: (!) 105   Resp: (!) 22   Temp: 98.6 F (37 C)   SpO2: 97%    General: Awake, oriented x4. CV:  Good peripheral perfusion.  Resp:  Normal effort.  Expiratory wheezing over bilateral lung fields Abd:  No distention.  Other:  Elderly well-developed, well-nourished Caucasian female resting comfortably in no acute distress ED Results /  Procedures / Treatments  Labs (all labs ordered are listed, but only abnormal results are displayed) Labs Reviewed  RESP PANEL BY RT-PCR (RSV, FLU A&B, COVID)  [id number]   EKG ED ECG REPORT I, Merwyn Katos, the attending physician, personally viewed and interpreted this ECG. Date: 06/26/2023 EKG Time: 1152 Rate: 103 Rhythm: Tachycardic sinus rhythm QRS Axis: normal Intervals: normal ST/T Wave abnormalities: normal Narrative Interpretation: Tachycardic sinus rhythm.  No evidence of acute ischemia RADIOLOGY ED MD interpretation: 2 view chest x-ray interpreted by me shows no evidence of acute abnormalities including no pneumonia, pneumothorax, or widened mediastinum.  Hyperinflation with chronic lung changes seen and including emphysematous changes -Agree with radiology assessment Official radiology report(s): DG Chest 2 View  Result Date: 06/26/2023 CLINICAL DATA:  Shortness of breath. EXAM: CHEST - 2 VIEW COMPARISON:  X-ray and CT angiogram 03/26/2023 FINDINGS: Hyperinflation. No consolidation, pneumothorax or effusion. No edema. Chronic lung changes identified with some bulla and bleb formation suggested. Normal cardiopericardial silhouette. Overlapping cardiac leads. IMPRESSION: Hyperinflation with chronic lung changes seen including emphysematous changes. Electronically Signed   By: Karen Kays M.D.   On: 06/26/2023 13:49   PROCEDURES: Critical Care  performed: No .1-3 Lead EKG Interpretation  Performed by: Merwyn Katos, MD Authorized by: Merwyn Katos, MD     Interpretation: normal     ECG rate:  95   ECG rate assessment: normal     Rhythm: sinus rhythm     Ectopy: none     Conduction: normal    MEDICATIONS ORDERED IN ED: Medications  albuterol (PROVENTIL) (2.5 MG/3ML) 0.083% nebulizer solution 3 mL (has no administration in time range)   IMPRESSION / MDM / ASSESSMENT AND PLAN / ED COURSE  I reviewed the triage vital signs and the nursing notes.                              The patient is on the cardiac monitor to evaluate for evidence of arrhythmia and/or significant heart rate changes. Patient's presentation is most consistent with acute presentation with potential threat to life or bodily function. The patient appears to be suffering from a moderate exacerbation of COPD.  Based on the history, exam, CXR/EKG, and further workup I don't suspect any other emergent cause of this presentation, such as pneumonia, acute coronary syndrome, congestive heart failure, pulmonary embolism, or pneumothorax.  ED Interventions: bronchodilators, steroids, antibiotics, reassess  Reassessment: After treatment, the patient's shortness of breath is resolved, and their lung exam has returned to baseline. They are comfortable and want to go home.  Rx: Steroids, Antibiotics, Albuterol Disposition: Discharge home with SRP. PCP follow up recommended in next 48hours.   FINAL CLINICAL IMPRESSION(S) / ED DIAGNOSES   Final diagnoses:  COPD exacerbation (HCC)   Rx / DC Orders   ED Discharge Orders          Ordered    methylPREDNISolone (MEDROL DOSEPAK) 4 MG TBPK tablet        06/26/23 1351           Note:  This document was prepared using Dragon voice recognition software and may include unintentional dictation errors.   Merwyn Katos, MD 06/26/23 [phone]

## 2023-06-26 NOTE — ED Triage Notes (Signed)
EMS states patient has been having shortness of breath, weakness and cough for the past 2 days; patient on baseline 3L [place].  125mg  Solumedrol Albuterol Duoneb

## 2023-09-19 ENCOUNTER — Other Ambulatory Visit: Payer: Self-pay | Admitting: Physician Assistant

## 2023-09-19 DIAGNOSIS — Z1231 Encounter for screening mammogram for malignant neoplasm of breast: Secondary | ICD-10-CM

## 2023-10-29 ENCOUNTER — Ambulatory Visit
Admission: RE | Admit: 2023-10-29 | Discharge: 2023-10-29 | Disposition: A | Discharge status: Home / Self Care | Payer: 59 | Source: Ambulatory Visit | Attending: Acute Care | Admitting: Acute Care

## 2023-10-29 DIAGNOSIS — Z87891 Personal history of nicotine dependence: Secondary | ICD-10-CM

## 2023-10-29 DIAGNOSIS — Z122 Encounter for screening for malignant neoplasm of respiratory organs: Secondary | ICD-10-CM

## 2023-11-12 ENCOUNTER — Other Ambulatory Visit: Payer: Self-pay

## 2023-11-12 DIAGNOSIS — Z122 Encounter for screening for malignant neoplasm of respiratory organs: Secondary | ICD-10-CM

## 2023-11-12 DIAGNOSIS — Z87891 Personal history of nicotine dependence: Secondary | ICD-10-CM

## 2023-11-21 ENCOUNTER — Emergency Department: Payer: 59

## 2023-11-21 ENCOUNTER — Inpatient Hospital Stay
Admission: EM | Admit: 2023-11-21 | Discharge: 2023-11-24 | DRG: 191 | Disposition: A | Discharge status: Home / Self Care | Payer: 59 | Attending: Internal Medicine | Admitting: Internal Medicine

## 2023-11-21 ENCOUNTER — Encounter: Payer: Self-pay | Admitting: Emergency Medicine

## 2023-11-21 ENCOUNTER — Other Ambulatory Visit: Payer: Self-pay

## 2023-11-21 DIAGNOSIS — Z79899 Other long term (current) drug therapy: Secondary | ICD-10-CM

## 2023-11-21 DIAGNOSIS — R0602 Shortness of breath: Secondary | ICD-10-CM

## 2023-11-21 DIAGNOSIS — Z881 Allergy status to other antibiotic agents status: Secondary | ICD-10-CM

## 2023-11-21 DIAGNOSIS — J9621 Acute and chronic respiratory failure with hypoxia: Secondary | ICD-10-CM | POA: Diagnosis present

## 2023-11-21 DIAGNOSIS — Z8249 Family history of ischemic heart disease and other diseases of the circulatory system: Secondary | ICD-10-CM | POA: Diagnosis not present

## 2023-11-21 DIAGNOSIS — J9611 Chronic respiratory failure with hypoxia: Secondary | ICD-10-CM | POA: Diagnosis present

## 2023-11-21 DIAGNOSIS — Z87891 Personal history of nicotine dependence: Secondary | ICD-10-CM

## 2023-11-21 DIAGNOSIS — Z888 Allergy status to other drugs, medicaments and biological substances status: Secondary | ICD-10-CM | POA: Diagnosis not present

## 2023-11-21 DIAGNOSIS — R7309 Other abnormal glucose: Secondary | ICD-10-CM | POA: Diagnosis not present

## 2023-11-21 DIAGNOSIS — Z8489 Family history of other specified conditions: Secondary | ICD-10-CM | POA: Diagnosis not present

## 2023-11-21 DIAGNOSIS — J449 Chronic obstructive pulmonary disease, unspecified: Secondary | ICD-10-CM | POA: Diagnosis present

## 2023-11-21 DIAGNOSIS — Z7951 Long term (current) use of inhaled steroids: Secondary | ICD-10-CM

## 2023-11-21 DIAGNOSIS — J9610 Chronic respiratory failure, unspecified whether with hypoxia or hypercapnia: Secondary | ICD-10-CM | POA: Diagnosis present

## 2023-11-21 DIAGNOSIS — J441 Chronic obstructive pulmonary disease with (acute) exacerbation: Secondary | ICD-10-CM | POA: Diagnosis present

## 2023-11-21 DIAGNOSIS — I252 Old myocardial infarction: Secondary | ICD-10-CM | POA: Diagnosis not present

## 2023-11-21 DIAGNOSIS — D72829 Elevated white blood cell count, unspecified: Secondary | ICD-10-CM | POA: Diagnosis present

## 2023-11-21 DIAGNOSIS — K219 Gastro-esophageal reflux disease without esophagitis: Secondary | ICD-10-CM | POA: Diagnosis present

## 2023-11-21 DIAGNOSIS — Z823 Family history of stroke: Secondary | ICD-10-CM | POA: Diagnosis not present

## 2023-11-21 DIAGNOSIS — R739 Hyperglycemia, unspecified: Secondary | ICD-10-CM | POA: Insufficient documentation

## 2023-11-21 DIAGNOSIS — T50905A Adverse effect of unspecified drugs, medicaments and biological substances, initial encounter: Secondary | ICD-10-CM | POA: Diagnosis not present

## 2023-11-21 DIAGNOSIS — I5032 Chronic diastolic (congestive) heart failure: Secondary | ICD-10-CM | POA: Diagnosis present

## 2023-11-21 DIAGNOSIS — R051 Acute cough: Secondary | ICD-10-CM

## 2023-11-21 DIAGNOSIS — Z79620 Long term (current) use of immunosuppressive biologic: Secondary | ICD-10-CM

## 2023-11-21 DIAGNOSIS — T380X5A Adverse effect of glucocorticoids and synthetic analogues, initial encounter: Secondary | ICD-10-CM | POA: Diagnosis present

## 2023-11-21 DIAGNOSIS — F419 Anxiety disorder, unspecified: Secondary | ICD-10-CM | POA: Diagnosis present

## 2023-11-21 DIAGNOSIS — J9612 Chronic respiratory failure with hypercapnia: Secondary | ICD-10-CM | POA: Diagnosis not present

## 2023-11-21 DIAGNOSIS — F32A Depression, unspecified: Secondary | ICD-10-CM | POA: Diagnosis present

## 2023-11-21 DIAGNOSIS — Z1152 Encounter for screening for COVID-19: Secondary | ICD-10-CM

## 2023-11-21 LAB — CBC
HCT: 39.3 % (ref 36.0–46.0)
Hemoglobin: 12.9 g/dL (ref 12.0–15.0)
MCH: 31.2 pg (ref 26.0–34.0)
MCHC: 32.8 g/dL (ref 30.0–36.0)
MCV: 94.9 fL (ref 80.0–100.0)
Platelets: 287 10*3/uL (ref 150–400)
RBC: 4.14 MIL/uL (ref 3.87–5.11)
RDW: 13.1 % (ref 11.5–15.5)
WBC: 11.7 10*3/uL — ABNORMAL HIGH (ref 4.0–10.5)
nRBC: 0 % (ref 0.0–0.2)

## 2023-11-21 LAB — RESPIRATORY PANEL BY PCR

## 2023-11-21 LAB — BASIC METABOLIC PANEL
Anion gap: 8 (ref 5–15)
BUN: 8 mg/dL (ref 8–23)
CO2: 31 mmol/L (ref 22–32)
Calcium: 8.8 mg/dL — ABNORMAL LOW (ref 8.9–10.3)
Chloride: 97 mmol/L — ABNORMAL LOW (ref 98–111)
Creatinine, Ser: 0.77 mg/dL (ref 0.44–1.00)
GFR, Estimated: >60 mL/min (ref >60)
Glucose, Bld: 169 mg/dL — ABNORMAL HIGH (ref 70–99)
Potassium: 3.6 mmol/L (ref 3.5–5.1)
Sodium: 136 mmol/L (ref 135–145)

## 2023-11-21 LAB — BLOOD GAS, VENOUS
Acid-Base Excess: 8.7 mmol/L — ABNORMAL HIGH (ref 0.0–2.0)
Bicarbonate: 36.4 mmol/L — ABNORMAL HIGH (ref 20.0–28.0)
O2 Content: 2 L/min
O2 Saturation: 84.9 %
Patient temperature: 37
pCO2, Ven: 63 mm[Hg] — ABNORMAL HIGH (ref 44–60)
pH, Ven: 7.37 (ref 7.25–7.43)
pO2, Ven: 50 mm[Hg] — ABNORMAL HIGH (ref 32–45)

## 2023-11-21 LAB — RESP PANEL BY RT-PCR (RSV, FLU A&B, COVID)  RVPGX2
Influenza A by PCR: NEGATIVE
Influenza B by PCR: NEGATIVE
Resp Syncytial Virus by PCR: NEGATIVE
SARS Coronavirus 2 by RT PCR: NEGATIVE

## 2023-11-21 LAB — TROPONIN I (HIGH SENSITIVITY)
Troponin I (High Sensitivity): 56 ng/L — ABNORMAL HIGH (ref <18)
Troponin I (High Sensitivity): 70 ng/L — ABNORMAL HIGH (ref <18)

## 2023-11-21 LAB — BRAIN NATRIURETIC PEPTIDE: B Natriuretic Peptide: 23.2 pg/mL (ref 0.0–100.0)

## 2023-11-21 LAB — CBG MONITORING, ED: Glucose-Capillary: 154 mg/dL — ABNORMAL HIGH (ref 70–99)

## 2023-11-21 LAB — HIV ANTIBODY (ROUTINE TESTING W REFLEX): HIV Screen 4th Generation wRfx: NONREACTIVE

## 2023-11-21 MED ORDER — PREDNISONE 20 MG PO TABS
60.0000 mg | ORAL_TABLET | Freq: Once | ORAL | Status: AC
  Administered 2023-11-21: 60 mg via ORAL
  Filled 2023-11-21: qty 3

## 2023-11-21 MED ORDER — DOXYCYCLINE HYCLATE 100 MG PO TABS
100.0000 mg | ORAL_TABLET | Freq: Once | ORAL | Status: AC
  Administered 2023-11-21: 100 mg via ORAL
  Filled 2023-11-21: qty 1

## 2023-11-21 MED ORDER — ALBUTEROL SULFATE (2.5 MG/3ML) 0.083% IN NEBU
3.0000 mL | INHALATION_SOLUTION | RESPIRATORY_TRACT | Status: DC | PRN
  Administered 2023-11-23: 3 mL via RESPIRATORY_TRACT
  Filled 2023-11-21: qty 3

## 2023-11-21 MED ORDER — MONTELUKAST SODIUM 10 MG PO TABS
10.0000 mg | ORAL_TABLET | Freq: Every day | ORAL | Status: DC
Start: 2023-11-21 — End: 2023-11-24
  Administered 2023-11-21 – 2023-11-23 (×3): 10 mg via ORAL
  Filled 2023-11-21 (×3): qty 1

## 2023-11-21 MED ORDER — IPRATROPIUM-ALBUTEROL 0.5-2.5 (3) MG/3ML IN SOLN
3.0000 mL | RESPIRATORY_TRACT | Status: DC
  Administered 2023-11-21 – 2023-11-23 (×12): 3 mL via RESPIRATORY_TRACT
  Filled 2023-11-21 (×12): qty 3

## 2023-11-21 MED ORDER — INSULIN ASPART 100 UNIT/ML IJ SOLN
0.0000 [IU] | Freq: Three times a day (TID) | INTRAMUSCULAR | Status: DC
  Administered 2023-11-21 – 2023-11-23 (×5): 3 [IU] via SUBCUTANEOUS
  Administered 2023-11-23: 2 [IU] via SUBCUTANEOUS
  Administered 2023-11-24: 3 [IU] via SUBCUTANEOUS
  Filled 2023-11-21 (×7): qty 1

## 2023-11-21 MED ORDER — UMECLIDINIUM BROMIDE 62.5 MCG/ACT IN AEPB
1.0000 | INHALATION_SPRAY | Freq: Every day | RESPIRATORY_TRACT | Status: DC
  Administered 2023-11-23 – 2023-11-24 (×2): 1 via RESPIRATORY_TRACT
  Filled 2023-11-21 (×2): qty 7

## 2023-11-21 MED ORDER — ONDANSETRON HCL 4 MG PO TABS: 4.0000 mg | ORAL_TABLET | Freq: Four times a day (QID) | ORAL | Status: DC | PRN

## 2023-11-21 MED ORDER — ESCITALOPRAM OXALATE 10 MG PO TABS
20.0000 mg | ORAL_TABLET | Freq: Every day | ORAL | Status: DC
  Administered 2023-11-22 – 2023-11-24 (×3): 20 mg via ORAL
  Filled 2023-11-21 (×3): qty 2

## 2023-11-21 MED ORDER — FLUTICASONE FUROATE-VILANTEROL 100-25 MCG/ACT IN AEPB
1.0000 | INHALATION_SPRAY | Freq: Every day | RESPIRATORY_TRACT | Status: DC
  Administered 2023-11-23 – 2023-11-24 (×2): 1 via RESPIRATORY_TRACT
  Filled 2023-11-21 (×2): qty 28

## 2023-11-21 MED ORDER — ENOXAPARIN SODIUM 40 MG/0.4ML IJ SOSY
40.0000 mg | PREFILLED_SYRINGE | INTRAMUSCULAR | Status: DC
  Administered 2023-11-21 – 2023-11-23 (×3): 40 mg via SUBCUTANEOUS
  Filled 2023-11-21 (×3): qty 0.4

## 2023-11-21 MED ORDER — ONDANSETRON HCL 4 MG/2ML IJ SOLN: 4.0000 mg | Freq: Four times a day (QID) | INTRAMUSCULAR | Status: DC | PRN

## 2023-11-21 MED ORDER — MIRTAZAPINE 15 MG PO TABS
30.0000 mg | ORAL_TABLET | Freq: Every day | ORAL | Status: DC
  Administered 2023-11-21 – 2023-11-22 (×2): 30 mg via ORAL
  Filled 2023-11-21 (×2): qty 2

## 2023-11-21 MED ORDER — METHYLPREDNISOLONE SODIUM SUCC 40 MG IJ SOLR
40.0000 mg | Freq: Four times a day (QID) | INTRAMUSCULAR | Status: DC
  Administered 2023-11-21 – 2023-11-22 (×5): 40 mg via INTRAVENOUS
  Filled 2023-11-21 (×5): qty 1

## 2023-11-21 MED ORDER — BUSPIRONE HCL 10 MG PO TABS
10.0000 mg | ORAL_TABLET | Freq: Three times a day (TID) | ORAL | Status: DC
  Administered 2023-11-21 – 2023-11-24 (×9): 10 mg via ORAL
  Filled 2023-11-21 (×2): qty 2
  Filled 2023-11-21 (×2): qty 1
  Filled 2023-11-21: qty 2
  Filled 2023-11-21 (×3): qty 1
  Filled 2023-11-21: qty 2

## 2023-11-21 MED ORDER — IPRATROPIUM-ALBUTEROL 0.5-2.5 (3) MG/3ML IN SOLN
3.0000 mL | Freq: Once | RESPIRATORY_TRACT | Status: AC
Start: 2023-11-21 — End: 2023-11-21
  Administered 2023-11-21: 3 mL via RESPIRATORY_TRACT
  Filled 2023-11-21: qty 3

## 2023-11-21 MED ORDER — IPRATROPIUM-ALBUTEROL 0.5-2.5 (3) MG/3ML IN SOLN
3.0000 mL | Freq: Once | RESPIRATORY_TRACT | Status: AC
  Administered 2023-11-21: 3 mL via RESPIRATORY_TRACT
  Filled 2023-11-21: qty 3

## 2023-11-21 MED ORDER — ALPRAZOLAM 0.25 MG PO TABS
0.2500 mg | ORAL_TABLET | Freq: Two times a day (BID) | ORAL | Status: DC | PRN
  Administered 2023-11-22 – 2023-11-23 (×2): 0.25 mg via ORAL
  Filled 2023-11-21 (×2): qty 1

## 2023-11-21 MED ORDER — ALBUTEROL SULFATE (2.5 MG/3ML) 0.083% IN NEBU
3.0000 mL | INHALATION_SOLUTION | Freq: Once | RESPIRATORY_TRACT | Status: AC
  Administered 2023-11-21: 3 mL via RESPIRATORY_TRACT
  Filled 2023-11-21: qty 3

## 2023-11-21 NOTE — ED Notes (Signed)
Lab called stating that they needed another Lavender top tube. Lab informed that RN that a difficult time collecting blood and they would need to send someone to drawn pts blood.

## 2023-11-21 NOTE — ED Triage Notes (Signed)
C/O 2-3 days history of SOB and cough. Hx emphysema, wears 3l/ [place] home oxygen. Denies body aches, fever.

## 2023-11-21 NOTE — ED Notes (Signed)
First Nurse Note: Pt to ED via [facility] from home for Shob. Pt has had a cold and worsening cough over the past week. Pt reports coughing up green sputum. Per EMS, Pt was tripoding when EMS first arrived. Pt is on 3 liters at baseline with sats 91-92%. Pt was given 2 duo nebs, 125 solumedrol and 2 grams of mag in route and her breathing has improved.   BP- 129/72 HR- 88 RR- 24 SpO2- 98% on 3 L

## 2023-11-21 NOTE — H&P (Addendum)
History and Physical    Heidi [name] ZOX:[id number] DOB: [birth year] DOA: 11/21/2023  PCP: Patrice Paradise, MD (Confirm with patient/family/NH records and if not entered, this has to be entered at [facility] point of entry) Patient coming from: Home  I have personally briefly reviewed patient's old medical records in [facility]  Chief Complaint: Cough, wheezing, SOB  HPI: Heidi [name] is a 68 y.o. female with medical history significant of COPD Gold stage IV, chronic hypoxic respiratory failure, chronic HFpEF presented with worsening of cough wheezing and shortness of breath.  Last week patient's boyfriend had flu infection with URI-like symptoms including cough and shortness of breath.  4 days ago patient started to have dry cough wheezing and shortness of breath, gradually getting worse.  Denied any fever chills no chest pains.  She turned up her oxygen to help her up with no significant improvement.  ED Course: Afebrile, not tachycardia, tachypneic O2 saturation 96% on 3 L.  Chest x-ray showed no acute infiltrates.  WBC 11.7, hemoglobin 12.9, VBG 7.37/63/50, BMP showed potassium 3.6, CO2 31, glucose 169, creatinine 0.7.  Patient was given DuoNebs x 3 and 1 dose of prednisone in the ED.  Review of Systems: As per HPI otherwise 14 point review of systems negative.    Past Medical History:  Diagnosis Date   Anxiety    COPD (chronic obstructive pulmonary disease) (HCC)    GERD (gastroesophageal reflux disease)    Hypotension    limiting med titration   NSTEMI (non-ST elevated myocardial infarction) (HCC) 2013   12/2011 with normal coronaries possibly secondary to Takotsubo (EF 30-35% by echo), occurred following two episodes of acute respiratory distress   Pneumonia 11/2016   history of   Takotsubo syndrome 4/13    Past Surgical History:  Procedure Laterality Date   ABDOMINAL HYSTERECTOMY     CARDIAC CATHETERIZATION     CHOLECYSTECTOMY N/A 06/05/2017   Procedure:  LAPAROSCOPIC CHOLECYSTECTOMY;  Surgeon: Nadeen Landau, MD;  Location: [facility] ORS;  Service: General;  Laterality: N/A;   FRACTURE SURGERY Left 07/2016   leg compound fraction   LEFT HEART CATHETERIZATION WITH CORONARY ANGIOGRAM N/A 01/23/2012   Procedure: LEFT HEART CATHETERIZATION WITH CORONARY ANGIOGRAM;  Surgeon: Dolores Patty, MD;  Location: [facility] CATH LAB;  Service: Cardiovascular;  Laterality: N/A;   vagina rectal repair     after childbirth     reports that she quit smoking about 11 years ago. Her smoking use included cigarettes. She started smoking about 41 years ago. She has a 30 pack-year smoking history. She has never used smokeless tobacco. She reports current alcohol use. She reports that she does not use drugs.  Allergies  Allergen Reactions   Prozac [Fluoxetine] Itching   Budesonide     Other reaction(s): Dizziness (per pt: still takes this medication)   Levaquin [Levofloxacin In D5w] Hives   Propranolol Other (See Comments)    dizziness   Zoloft [Sertraline Hcl] Other (See Comments)    Increased anxiety    Family History  Problem Relation Age of Onset   Heart attack Mother        deceased from massive MI at age 62   Liver disease Father        fatty liver; living, age 21   Stroke Brother        at age 55, living    Hypertension Brother        living, age 47     Prior to Admission  medications   Medication Sig Start Date End Date Taking? Authorizing Provider  albuterol (PROVENTIL HFA;VENTOLIN HFA) 108 (90 Base) MCG/ACT inhaler Inhale 1-2 puffs into the lungs every 4 (four) hours as needed for wheezing or shortness of breath.    [provider]  albuterol (PROVENTIL) (2.5 MG/3ML) 0.083% nebulizer solution Take 3 mLs (2.5 mg total) by nebulization every 6 (six) hours as needed for wheezing or shortness of breath. 07/10/19   Parrett, Virgel Bouquet, NP  ALPRAZolam (XANAX) 0.5 MG tablet Take 0.5 mg by mouth. 04/09/23   Vida Rigger, MD  busPIRone (BUSPAR)  10 MG tablet Take 1 tablet by mouth 3 (three) times daily. 03/26/23   Patrice Paradise, MD  DUPIXENT 300 MG/2ML SOPN Inject into the skin. 06/09/22   [provider]  EPINEPHrine 0.3 mg/0.3 mL IJ SOAJ injection Inject into the muscle. 05/04/22   [provider]  escitalopram (LEXAPRO) 20 MG tablet Take 1 tablet by mouth daily. 02/22/23   Patrice Paradise, MD  fluticasone (FLONASE) 50 MCG/ACT nasal spray USE 2 SPRAYS IN EACH NOSTRIL ONCE DAILY 05/01/18   Lupita Leash, MD  Ipratropium-Albuterol (COMBIVENT RESPIMAT) 20-100 MCG/ACT AERS respimat INHALE 1 PUFF BY MOUTH EVERY 6 HOURS AS NEEDED FOR WHEEZING 07/10/19   Parrett, Tammy S, NP  ipratropium-albuterol (DUONEB) 0.5-2.5 (3) MG/3ML SOLN Take 3 mLs by nebulization every 4 (four) hours as needed. 11/05/20   Alberteen Sam, MD  methylPREDNISolone (MEDROL DOSEPAK) 4 MG TBPK tablet Per package dirctions 06/26/23   Merwyn Katos, MD  mirtazapine (REMERON) 30 MG tablet Take 30 mg by mouth at bedtime. 04/04/23   Vida Rigger, MD  montelukast (SINGULAIR) 10 MG tablet Take 10 mg by mouth at bedtime.    [provider]  mupirocin ointment (BACTROBAN) 2 % Apply 1 application topically daily. Apply to wound QD until healed. Patient not taking: Reported on 06/28/2022 09/01/21   Neale Burly, [place], MD  predniSONE (DELTASONE) 5 MG tablet Take 5 mg by mouth daily. 04/18/22   [provider]  TRELEGY ELLIPTA 100-62.5-25 MCG/INH AEPB INHALE 1 PUFF ONCE DAILY -  NEED  APPT  FOR  FURTHER  REFILLS 03/20/21   Julio Sicks, NP    Physical Exam: Vitals:   11/21/23 0904 11/21/23 0905 11/21/23 1315  BP: 136/65    Pulse: 90    Resp: 20    Temp: 98.2 F (36.8 C)  97.7 F (36.5 C)  TempSrc: Oral  Oral  SpO2: 96%    Weight:  70.3 kg     Constitutional: NAD, calm, comfortable Vitals:   11/21/23 0904 11/21/23 0905 11/21/23 1315  BP: 136/65    Pulse: 90    Resp: 20    Temp: 98.2 F (36.8 C)  97.7 F (36.5 C)   TempSrc: Oral  Oral  SpO2: 96%    Weight:  70.3 kg    Eyes: PERRL, lids and conjunctivae normal ENMT: Mucous membranes are moist. Posterior pharynx clear of any exudate or lesions.Normal dentition.  Neck: normal, supple, no masses, no thyromegaly Respiratory: Diminished breathing sound bilaterally, no crackles, diffused wheezing, increasing respiratory effort.  Positive signs of accessory muscle use.  Cardiovascular: Regular rate and rhythm, no murmurs / rubs / gallops. No extremity edema. 2+ pedal pulses. No carotid bruits.  Abdomen: no tenderness, no masses palpated. No hepatosplenomegaly. Bowel sounds positive.  Musculoskeletal: no clubbing / cyanosis. No joint deformity upper and lower extremities. Good ROM, no contractures. Normal muscle tone.  Skin: no rashes,  lesions, ulcers. No induration Neurologic: CN 2-12 grossly intact. Sensation intact, DTR normal. Strength 5/5 in all 4.  Psychiatric: Normal judgment and insight. Alert and oriented x 3. Normal mood.    Labs on Admission: I have personally reviewed following labs and imaging studies  CBC: Recent Labs  Lab 11/21/23 1002  WBC 11.7*  HGB 12.9  HCT 39.3  MCV 94.9  PLT 287   Basic Metabolic Panel: Recent Labs  Lab 11/21/23 1002  NA 136  K 3.6  CL 97*  CO2 31  GLUCOSE 169*  BUN 8  CREATININE 0.77  CALCIUM 8.8*   GFR: Estimated Creatinine Clearance: 67.1 mL/min (by C-G formula based on SCr of 0.77 mg/dL). Liver Function Tests: No results for input(s): "AST", "ALT", "ALKPHOS", "BILITOT", "PROT", "ALBUMIN" in the last 168 hours. No results for input(s): "LIPASE", "AMYLASE" in the last 168 hours. No results for input(s): "AMMONIA" in the last 168 hours. Coagulation Profile: No results for input(s): "INR", "PROTIME" in the last 168 hours. Cardiac Enzymes: No results for input(s): "CKTOTAL", "CKMB", "CKMBINDEX", "TROPONINI" in the last 168 hours. BNP (last 3 results) No results for input(s): "PROBNP" in the last  8760 hours. HbA1C: No results for input(s): "HGBA1C" in the last 72 hours. CBG: No results for input(s): "GLUCAP" in the last 168 hours. Lipid Profile: No results for input(s): "CHOL", "HDL", "LDLCALC", "TRIG", "CHOLHDL", "LDLDIRECT" in the last 72 hours. Thyroid Function Tests: No results for input(s): "TSH", "T4TOTAL", "FREET4", "T3FREE", "THYROIDAB" in the last 72 hours. Anemia Panel: No results for input(s): "VITAMINB12", "FOLATE", "FERRITIN", "TIBC", "IRON", "RETICCTPCT" in the last 72 hours. Urine analysis:    Component Value Date/Time   COLORURINE STRAW (A) 12/03/2016 0101   APPEARANCEUR CLEAR (A) 12/03/2016 0101   APPEARANCEUR Clear 06/29/2013 2244   LABSPEC 1.006 12/03/2016 0101   LABSPEC 1.005 06/29/2013 2244   PHURINE 5.0 12/03/2016 0101   GLUCOSEU 150 (A) 12/03/2016 0101   GLUCOSEU Negative 06/29/2013 2244   HGBUR SMALL (A) 12/03/2016 0101   HGBUR trace-lysed 07/12/2009 1033   BILIRUBINUR NEGATIVE 12/03/2016 0101   BILIRUBINUR Negative 06/29/2013 2244   KETONESUR NEGATIVE 12/03/2016 0101   PROTEINUR NEGATIVE 12/03/2016 0101   UROBILINOGEN 0.2 08/08/2012 0107   NITRITE NEGATIVE 12/03/2016 0101   LEUKOCYTESUR NEGATIVE 12/03/2016 0101   LEUKOCYTESUR Trace 06/29/2013 2244    Radiological Exams on Admission: DG Chest 2 View Result Date: 11/21/2023 CLINICAL DATA:  Cough. EXAM: CHEST - 2 VIEW COMPARISON:  Chest radiograph dated June 26, 2023. CT chest dated October 02, 2023. FINDINGS: Stable cardiomediastinal silhouette. Hyperinflation with emphysematous changes, compatible with COPD. No focal consolidation, pleural effusion, or pneumothorax. No acute osseous abnormality. IMPRESSION: 1. No acute cardiopulmonary findings. 2. COPD. Electronically Signed   By: Hart Robinsons M.D.   On: 11/21/2023 09:48    EKG: Independently reviewed.  Sinus rhythm, no acute ST changes.  Assessment/Plan Principal Problem:   COPD (chronic obstructive pulmonary disease) (HCC) Active  Problems:   COPD with acute exacerbation (HCC)  (please populate well all problems here in Problem List. (For example, if patient is on BP meds at home and you resume or decide to hold them, it is a problem that needs to be her. Same for CAD, COPD, HLD and so on)  Acute on chronic hypoxic respiratory failure Acute COPD exacerbation -Continue IV Solu-Medrol -To relieve patient's breathing effort, BiPAP order.  Patient agreed with BiPAP. -Continue ICS, LABA, DuoNebs and as needed albuterol -Incentive spirometry and flutter valve -Trigger is less clear, COVID  and flu test both negative, will check respiratory panel -Other DDx, proBNP is low and patient appears to be euvolemic, low suspicion for CHF decompensation, monitor off diuresis.  Leukocytosis -Probably reactive, monitor off antibiotics  Elevated glucose -Outpatient PCP follow-up for A1c and diabetes workup -As patient will be on steroid for COPD exacerbation, will add SSI for now  Chronic HFpEF -Euvolemic, blood pressure stable, hold off diuresis  Anxiety/depression -Mentation at baseline, continue SSRI  DVT prophylaxis: Lovenox Code Status: Full code Family Communication: None at bedside Disposition Plan: Patient is sick with acute on chronic hypoxic respiratory failure on top of known COPD, requiring IV steroid and BiPAP support, expect more than 2 midnight hospital stay Consults called: None Admission status: PCU admit   Emeline General MD [facility] Pager [phone]  11/21/2023, 1:27 PM

## 2023-11-21 NOTE — ED Notes (Signed)
Pt requesting something to eat while taking prednisone. Pt given graham crackers with Dr. Leone Brand permission.

## 2023-11-21 NOTE — ED Notes (Signed)
Pt up to bed side commode, x1 bowel movement, x1 urination. PT back into bed, significant other at bedside. Pt denies other needs

## 2023-11-21 NOTE — ED Notes (Signed)
Heidi [name] from the lab called stating that they needed another green top. Heidi [name] informed we had previously ask the lab to come and redraw blood due to pt being a difficult stick

## 2023-11-21 NOTE — ED Provider Notes (Signed)
[facility] Provider Note    Event Date/Time   First MD Initiated Contact with Patient 11/21/23 1015     (approximate)   History   Shortness of Breath   HPI  Heidi [name] is a 68 y.o. female COPD on 3 L nasal cannula at baseline, history of Takotsubo syndrome, GERD, presenting with shortness of breath for the last 4 days.  Also with a new cough, states that is productive of a small amount of sputum.  No fevers, no chills or myalgias.  No nausea, vomiting, diarrhea, chest pain, leg swelling, back pain.  States that this is consistent with her prior COPD, states that she has been using her inhalers as well as nebulizers twice a day.  No sick contacts at home.  States the last time she was admitted for COPD was 6 months ago, has been intubated once.   Independent chart review of her prior visits and office visits, she was seen by pulmonology in November of last year, has history of stage IV COPD, she does have a history of a dry chronic cough.  Physical Exam   Triage Vital Signs: ED Triage Vitals  Encounter Vitals Group     BP 11/21/23 0904 136/65     Systolic BP Percentile --      Diastolic BP Percentile --      Pulse Rate 11/21/23 0904 90     Resp 11/21/23 0904 20     Temp 11/21/23 0904 98.2 F (36.8 C)     Temp Source 11/21/23 0904 Oral     SpO2 11/21/23 0904 96 %     Weight 11/21/23 0905 154 lb 15.7 oz (70.3 kg)     Height --      Head Circumference --      Peak Flow --      Pain Score 11/21/23 0905 0     Pain Loc --      Pain Education --      Exclude from Growth Chart --     Most recent vital signs: Vitals:   11/21/23 0904  BP: 136/65  Pulse: 90  Resp: 20  Temp: 98.2 F (36.8 C)  SpO2: 96%     General: Awake, sitting up in bed CV:  Good peripheral perfusion.  Resp:  Normal effort.  Tachypneic, speaking in short sentences, diffuse expiratory wheeze Abd:  No distention.  Soft nontender Other:  No lower extremity edema, no  unilateral calf swelling or tenderness   ED Results / Procedures / Treatments   Labs (all labs ordered are listed, but only abnormal results are displayed) Labs Reviewed  CBC - Abnormal; Notable for the following components:      Result Value   WBC 11.7 (*)    All other components within normal limits  BASIC METABOLIC PANEL - Abnormal; Notable for the following components:   Chloride 97 (*)    Glucose, Bld 169 (*)    Calcium 8.8 (*)    All other components within normal limits  TROPONIN I (HIGH SENSITIVITY) - Abnormal; Notable for the following components:   Troponin I (High Sensitivity) 56 (*)    All other components within normal limits  RESP PANEL BY RT-PCR (RSV, FLU A&B, COVID)  RVPGX2  RESPIRATORY PANEL BY PCR  BRAIN NATRIURETIC PEPTIDE  TROPONIN I (HIGH SENSITIVITY)     EKG  Sinus rhythm, rate 86, normal QRS, normal QTc, no ischemic ST elevation, she has T wave flattening in V2,  V5, V4, T wave inversion to aVL, T wave changed aVL is new compared to prior   RADIOLOGY Chest x-ray on my independent interpretation without any obvious consolidation.   PROCEDURES:  Critical Care performed: Yes, see critical care procedure note(s)  .Critical Care  Performed by: Claybon Jabs, MD Authorized by: Claybon Jabs, MD   Critical care provider statement:    Critical care time (minutes):  40   Critical care was necessary to treat or prevent imminent or life-threatening deterioration of the following conditions:  Respiratory failure   Critical care was time spent personally by me on the following activities:  Development of treatment plan with patient or surrogate, discussions with consultants, evaluation of patient's response to treatment, examination of patient, ordering and review of laboratory studies, ordering and review of radiographic studies, ordering and performing treatments and interventions, pulse oximetry, re-evaluation of patient's condition and review of old  charts    MEDICATIONS ORDERED IN ED: Medications  ipratropium-albuterol (DUONEB) 0.5-2.5 (3) MG/3ML nebulizer solution 3 mL (3 mLs Nebulization Given 11/21/23 1032)  ipratropium-albuterol (DUONEB) 0.5-2.5 (3) MG/3ML nebulizer solution 3 mL (3 mLs Nebulization Given 11/21/23 1032)  ipratropium-albuterol (DUONEB) 0.5-2.5 (3) MG/3ML nebulizer solution 3 mL (3 mLs Nebulization Given 11/21/23 1032)  predniSONE (DELTASONE) tablet 60 mg (60 mg Oral Given 11/21/23 1031)  doxycycline (VIBRA-TABS) tablet 100 mg (100 mg Oral Given 11/21/23 1031)  albuterol (PROVENTIL) (2.5 MG/3ML) 0.083% nebulizer solution 3 mL (3 mLs Nebulization Given 11/21/23 1224)     IMPRESSION / MDM / ASSESSMENT AND PLAN / ED COURSE  I reviewed the triage vital signs and the nursing notes.                              Differential diagnosis includes, but is not limited to, COPD exacerbation, influenza, RSV, COVID, pneumonia, also consider atypical ACS, considered PE the patient has no unilateral calf swelling or tenderness, no history of blood clots, also has diffuse expiratory wheeze on exam, favoring COPD exacerbation.  Will give her DuoNebs x 3, prednisone, doxycycline.  Reassess.  Patient's presentation is most consistent with acute presentation with potential threat to life or bodily function.  Patient presenting with COPD exacerbation, mild improvement only after DuoNebs, will give her continuous albuterol.  Independent review of labs and imaging are noted elsewhere in the chart.  Shared decision making done with patient and she is agreeable plan for admission given her persistent symptoms despite initial DuoNebs.  Consult the hospitalist who will evaluate the patient.  She is admitted.   Clinical Course as of 11/21/23 1251  Wed Nov 21, 2023  1028 DG Chest 2 View IMPRESSION: 1. No acute cardiopulmonary findings. 2. COPD.   [TT]  1028 CBC(!) Mild leukocytosis, H&H stable, platelets are normal. [TT]  1035 Basic  metabolic panel(!) Electrolytes not severely deranged, creatinine is normal [TT]  1207 B Natriuretic Peptide: 23.2 Not elevated [TT]  1208 Resp panel by RT-PCR (RSV, Flu A&B, Covid) Anterior Nasal Swab Negative [TT]  1214 Troponin I (High Sensitivity)(!): 56 Suspect this might be due to demand, patient has no chest pain at this time.  Will trend trops. [TT]  1215 On reassessment patient states she is feeling slightly better from the DuoNebs but still having shortness of breath, still has diffuse expiratory wheezing, will give her a continuous albuterol neb.  Will plan to have her admitted for COPD exacerbation. [TT]  1251 Consult the hospitalist who is  agreeable with plan for admission will evaluate the patient. [TT]    Clinical Course User Index [TT] Jodie Echevaria, Franchot Erichsen, MD     FINAL CLINICAL IMPRESSION(S) / ED DIAGNOSES   Final diagnoses:  COPD exacerbation (HCC)  SOB (shortness of breath)  Acute cough     Rx / DC Orders   ED Discharge Orders     None        Note:  This document was prepared using Dragon voice recognition software and may include unintentional dictation errors.    Claybon Jabs, MD 11/21/23 [phone]

## 2023-11-22 DIAGNOSIS — F419 Anxiety disorder, unspecified: Secondary | ICD-10-CM

## 2023-11-22 DIAGNOSIS — J9621 Acute and chronic respiratory failure with hypoxia: Secondary | ICD-10-CM

## 2023-11-22 DIAGNOSIS — D72829 Elevated white blood cell count, unspecified: Secondary | ICD-10-CM | POA: Diagnosis not present

## 2023-11-22 DIAGNOSIS — F32A Depression, unspecified: Secondary | ICD-10-CM

## 2023-11-22 DIAGNOSIS — J441 Chronic obstructive pulmonary disease with (acute) exacerbation: Secondary | ICD-10-CM | POA: Diagnosis not present

## 2023-11-22 DIAGNOSIS — I5032 Chronic diastolic (congestive) heart failure: Secondary | ICD-10-CM | POA: Diagnosis not present

## 2023-11-22 DIAGNOSIS — R7309 Other abnormal glucose: Secondary | ICD-10-CM | POA: Diagnosis not present

## 2023-11-22 LAB — HEMOGLOBIN A1C
Hgb A1c MFr Bld: 6 % — ABNORMAL HIGH (ref 4.8–5.6)
Mean Plasma Glucose: 125.5 mg/dL

## 2023-11-22 LAB — GLUCOSE, CAPILLARY: Glucose-Capillary: 170 mg/dL — ABNORMAL HIGH (ref 70–99)

## 2023-11-22 LAB — CBG MONITORING, ED
Glucose-Capillary: 153 mg/dL — ABNORMAL HIGH (ref 70–99)
Glucose-Capillary: 188 mg/dL — ABNORMAL HIGH (ref 70–99)
Glucose-Capillary: 157 mg/dL — ABNORMAL HIGH (ref 70–99)

## 2023-11-22 MED ORDER — METHYLPREDNISOLONE SODIUM SUCC 40 MG IJ SOLR
40.0000 mg | Freq: Two times a day (BID) | INTRAMUSCULAR | Status: DC
  Administered 2023-11-22 – 2023-11-24 (×4): 40 mg via INTRAVENOUS
  Filled 2023-11-22 (×4): qty 1

## 2023-11-22 MED ORDER — PHENOL 1.4 % MT LIQD: 1.0000 | OROMUCOSAL | Status: DC | PRN

## 2023-11-22 MED ORDER — GUAIFENESIN 100 MG/5ML PO LIQD: 5.0000 mL | ORAL | Status: DC | PRN

## 2023-11-22 NOTE — Progress Notes (Signed)
Progress Note   Patient: Heidi [name] NFA:[id number] DOB: [birth year] DOA: 11/21/2023     1 DOS: the patient was seen and examined on 11/22/2023   Brief hospital course: Heidi [name] is a 68 y.o. female with medical history significant of COPD Gold stage IV, chronic hypoxic respiratory failure, chronic HFpEF presented with worsening of cough wheezing and shortness of breath.   Assessment and Plan: Acute on chronic hypoxic respiratory failure Acute COPD exacerbation Will taper IV Solu-Medrol and change to oral prior to discharge Taper Bipap. She is on 2-3L supplemental O2. Continue ICS, LABA, DuoNebs and as needed albuterol Encourage Incentive spirometry and flutter valve COVID and flu test both negative, respiratory panel negative Continue supplemental oxygen to maintain saturation greater than 90%. Out of bed to chair advised.   Leukocytosis Reactive. Monitor daily CBC as she is on steroids.   Elevated glucose Outpatient PCP follow-up for A1c and diabetes workup Continue SSI as she is on steroids.   Chronic HFpEF She is euvolemic. Not on diuretics at home. Monitor for volume overload.   Anxiety/depression Continue SSRI   Out of bed to chair. Incentive spirometry. Nursing supportive care. Fall, aspiration precautions. DVT prophylaxis   Code Status: Full Code  Subjective: Patient is seen and examined today morning.  She has severe cough, shortness of breath improved.  Did not get out of bed.  Appetite poor.  Physical Exam: Vitals:   11/22/23 0500 11/22/23 0600 11/22/23 0804 11/22/23 0955  BP: (!) 109/59  136/71   Pulse: 84  96   Resp: (!) 21  (!) 23   Temp:  97.6 F (36.4 C)  98.1 F (36.7 C)  TempSrc:  Oral  Oral  SpO2: 98%  98%   Weight:        General - Elderly Caucasian female, moderate respiratory distress and cough HEENT - PERRLA, EOMI, atraumatic head, non tender sinuses. Lung - Clear, diffuse rhonchi, wheezes. Heart - S1, S2 heard, no murmurs,  rubs, trace pedal edema. Abdomen - Soft, non tender, obese, bowel sounds good Neuro - Alert, awake and oriented x 3, non focal exam. Skin - Warm and dry.  Data Reviewed:      Latest Ref Rng & Units 11/21/2023   10:02 AM 03/26/2023    1:31 PM 08/20/2021   12:57 PM  CBC  WBC 4.0 - 10.5 K/uL 11.7  7.9  8.5   Hemoglobin 12.0 - 15.0 g/dL 46.9  62.9  52.8   Hematocrit 36.0 - 46.0 % 39.3  40.2  36.9   Platelets 150 - 400 K/uL 287  291  497       Latest Ref Rng & Units 11/21/2023   10:02 AM 03/26/2023    1:31 PM 08/20/2021   12:57 PM  BMP  Glucose 70 - 99 mg/dL 413  244  010   BUN 8 - 23 mg/dL 8  13  13    Creatinine 0.44 - 1.00 mg/dL 2.72  5.36  6.44   Sodium 135 - 145 mmol/L 136  139  139   Potassium 3.5 - 5.1 mmol/L 3.6  4.0  3.8   Chloride 98 - 111 mmol/L 97  99  98   CO2 22 - 32 mmol/L 31  31  34   Calcium 8.9 - 10.3 mg/dL 8.8  9.4  9.5    DG Chest 2 View Result Date: 11/21/2023 CLINICAL DATA:  Cough. EXAM: CHEST - 2 VIEW COMPARISON:  Chest radiograph dated June 26, 2023. CT chest  dated October 02, 2023. FINDINGS: Stable cardiomediastinal silhouette. Hyperinflation with emphysematous changes, compatible with COPD. No focal consolidation, pleural effusion, or pneumothorax. No acute osseous abnormality. IMPRESSION: 1. No acute cardiopulmonary findings. 2. COPD. Electronically Signed   By: Hart Robinsons M.D.   On: 11/21/2023 09:48   Family Communication: Discussed with patient, she understand and agree. All questions answereed.  Disposition: Status is: Inpatient Remains inpatient appropriate because: COPD exacerbation  Planned Discharge Destination: Home     Time spent: 38 minutes  Author: Marcelino Duster, MD 11/22/2023 5:09 PM Secure chat 7am to 7pm For on call review [email or web address].

## 2023-11-23 DIAGNOSIS — J441 Chronic obstructive pulmonary disease with (acute) exacerbation: Secondary | ICD-10-CM | POA: Diagnosis not present

## 2023-11-23 DIAGNOSIS — R7309 Other abnormal glucose: Secondary | ICD-10-CM | POA: Diagnosis not present

## 2023-11-23 DIAGNOSIS — D72829 Elevated white blood cell count, unspecified: Secondary | ICD-10-CM | POA: Diagnosis not present

## 2023-11-23 DIAGNOSIS — I5032 Chronic diastolic (congestive) heart failure: Secondary | ICD-10-CM | POA: Diagnosis not present

## 2023-11-23 LAB — GLUCOSE, CAPILLARY
Glucose-Capillary: 137 mg/dL — ABNORMAL HIGH (ref 70–99)
Glucose-Capillary: 179 mg/dL — ABNORMAL HIGH (ref 70–99)
Glucose-Capillary: 119 mg/dL — ABNORMAL HIGH (ref 70–99)

## 2023-11-23 MED ORDER — MIRTAZAPINE 15 MG PO TABS
15.0000 mg | ORAL_TABLET | Freq: Every day | ORAL | Status: DC
  Administered 2023-11-23: 15 mg via ORAL
  Filled 2023-11-23: qty 1

## 2023-11-23 MED ORDER — IPRATROPIUM-ALBUTEROL 0.5-2.5 (3) MG/3ML IN SOLN
3.0000 mL | Freq: Four times a day (QID) | RESPIRATORY_TRACT | Status: DC
  Administered 2023-11-23 – 2023-11-24 (×3): 3 mL via RESPIRATORY_TRACT
  Filled 2023-11-23 (×3): qty 3

## 2023-11-23 MED ORDER — SULFAMETHOXAZOLE-TRIMETHOPRIM 400-80 MG PO TABS
1.0000 | ORAL_TABLET | ORAL | Status: DC
  Administered 2023-11-23: 1 via ORAL
  Filled 2023-11-23: qty 1

## 2023-11-23 NOTE — Progress Notes (Signed)
Progress Note   Patient: Heidi [name] WJX:[id number] DOB: [birth year] DOA: 11/21/2023     2 DOS: the patient was seen and examined on 11/23/2023   Brief hospital course: KRYSTYN PICKING is a 68 y.o. female with medical history significant of COPD Gold stage IV, chronic hypoxic respiratory failure, chronic HFpEF presented with worsening of cough wheezing and shortness of breath.   Assessment and Plan: Acute on chronic hypoxic respiratory failure Acute COPD exacerbation Will taper IV Solu-Medrol and change to oral prior to discharge Taper Bipap. She is on 2-3L supplemental O2. Continue ICS, LABA, DuoNebs and as needed albuterol Encourage Incentive spirometry and flutter valve She will restarted on home Bactrim therapy. Continue supplemental oxygen to maintain saturation greater than 90%. Out of bed to chair advised.   Leukocytosis Reactive. Monitor daily CBC as she is on steroids.   Elevated glucose A1c 6. Carb consistent Diet  advised. Continue SSI as she is on steroids.   Chronic HFpEF She is euvolemic. Not on diuretics at home. Monitor for volume overload.   Anxiety/depression Continue SSRI   Out of bed to chair. Incentive spirometry. Nursing supportive care. Fall, aspiration precautions. DVT prophylaxis   Code Status: Full Code  Subjective: Patient is seen and examined today morning.  She is coughing. Able to get out of bed.  Advised incentive spirometry.  Appetite better.  Physical Exam: Vitals:   11/22/23 2339 11/23/23 0639 11/23/23 0820 11/23/23 1122  BP: (!) 159/70  (!) 160/70 131/71  Pulse: (!) 101  (!) 106 96  Resp: 20   18  Temp: 97.6 F (36.4 C)  97.7 F (36.5 C) 97.7 F (36.5 C)  TempSrc:   Oral   SpO2: 100%  98% 99%  Weight:  71.6 kg      General - Elderly Caucasian female, moderate respiratory distress and cough HEENT - PERRLA, EOMI, atraumatic head, non tender sinuses. Lung - Clear, diffuse rhonchi, wheezes. Heart - S1, S2 heard, no  murmurs, rubs, trace pedal edema. Abdomen - Soft, non tender, obese, bowel sounds good Neuro - Alert, awake and oriented x 3, non focal exam. Skin - Warm and dry.  Data Reviewed:      Latest Ref Rng & Units 11/21/2023   10:02 AM 03/26/2023    1:31 PM 08/20/2021   12:57 PM  CBC  WBC 4.0 - 10.5 K/uL 11.7  7.9  8.5   Hemoglobin 12.0 - 15.0 g/dL 21.3  08.6  57.8   Hematocrit 36.0 - 46.0 % 39.3  40.2  36.9   Platelets 150 - 400 K/uL 287  291  497       Latest Ref Rng & Units 11/21/2023   10:02 AM 03/26/2023    1:31 PM 08/20/2021   12:57 PM  BMP  Glucose 70 - 99 mg/dL 469  629  528   BUN 8 - 23 mg/dL 8  13  13    Creatinine 0.44 - 1.00 mg/dL 4.13  2.44  0.10   Sodium 135 - 145 mmol/L 136  139  139   Potassium 3.5 - 5.1 mmol/L 3.6  4.0  3.8   Chloride 98 - 111 mmol/L 97  99  98   CO2 22 - 32 mmol/L 31  31  34   Calcium 8.9 - 10.3 mg/dL 8.8  9.4  9.5    No results found.  Family Communication: Discussed with patient, she understand and agree. All questions answereed.  Disposition: Status is: Inpatient Remains inpatient appropriate because: COPD  exacerbation  Planned Discharge Destination: Home     Time spent: 39 minutes  Author: Marcelino Duster, MD 11/23/2023 4:06 PM Secure chat 7am to 7pm For on call review [email or web address].

## 2023-11-23 NOTE — TOC Initial Note (Signed)
Transition of Care [facility]) - Initial/Assessment Note    Patient Details  Name: Heidi [name] MRN: [id number] Date of Birth: 06-09-1956  Transition of Care [facility]) CM/SW Contact:    Truddie Hidden, RN Phone Number: 11/23/2023, 12:18 PM  Clinical Narrative:                 TOC continuing to follow patient's progress throughout discharge planning.        Patient Goals and CMS Choice            Expected Discharge Plan and Services                                              Prior Living Arrangements/Services                       Activities of Daily Living   ADL Screening (condition at time of admission) Independently performs ADLs?: Yes (appropriate for developmental age) Is the patient deaf or have difficulty hearing?: Yes Does the patient have difficulty seeing, even when wearing glasses/contacts?: Yes Does the patient have difficulty concentrating, remembering, or making decisions?: Yes  Permission Sought/Granted                  Emotional Assessment              Admission diagnosis:  COPD (chronic obstructive pulmonary disease) (HCC) [J44.9] SOB (shortness of breath) [R06.02] COPD exacerbation (HCC) [J44.1] Acute cough [R05.1] Patient Active Problem List   Diagnosis Date Noted   GAD (generalized anxiety disorder) 06/02/2017   Symptomatic cholelithiasis 06/02/2017   Elevated alkaline phosphatase level 04/09/2017   Sepsis due to pneumonia (HCC) 12/02/2016   Fine tremor 02/17/2016   COPD (chronic obstructive pulmonary disease) (HCC) 01/13/2016   Near syncope 12/01/2015   Aortic atherosclerosis (HCC) 12/01/2015   CAP (community acquired pneumonia) 01/05/2015   Ex-smoker 07/14/2014   Allergic rhinitis 03/03/2014   Anxiety 04/10/2013   Hoarseness 10/10/2012   Dysphonia 10/10/2012   Acute respiratory failure (HCC) 08/08/2012   Acute encephalopathy 08/08/2012   COPD with acute exacerbation (HCC) 08/08/2012   Lactic acidosis  08/08/2012   Chronic respiratory failure (HCC) 03/27/2012   Takotsubo cardiomyopathy 01/31/2012   Chest pain 01/23/2012   TINGLING 05/26/2010   Disorder of skin or subcutaneous tissue 11/08/2009   ASCUS PAP 08/02/2009   VAG HIGH RISK HUMAN PAPILLOMAVIRUS DNA TEST POS 08/02/2009   DEPRESSION, CHRONIC with anxiety 06/22/2009   MICROSCOPIC HEMATURIA 06/22/2009   Smoking history 04/06/2009   Contusion 04/06/2009   Acute upper respiratory infection 10/09/2007   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 [place] - [address] [place] [address] Phone: [phone] Fax: [phone]     Social Drivers of Health (SDOH) Social History: SDOH Screenings   Food Insecurity: No Food Insecurity (11/23/2023)  Housing: Unknown (11/23/2023)  Transportation Needs: No Transportation Needs (11/23/2023)  Utilities: Not At Risk (11/23/2023)  Depression (PHQ2-9): Low Risk  (04/11/2023)  Financial Resource Strain: Low Risk  (09/13/2023)   Received from [facility]  Physical Activity: Inactive (10/16/2018)  Social Connections:  Moderately Integrated (11/23/2023)  Stress: Stress Concern Present (10/16/2018)  Tobacco Use: Medium Risk (11/21/2023)   SDOH Interventions:     Readmission Risk Interventions     No data to display

## 2023-11-23 NOTE — Plan of Care (Signed)

## 2023-11-24 DIAGNOSIS — J9612 Chronic respiratory failure with hypercapnia: Secondary | ICD-10-CM

## 2023-11-24 DIAGNOSIS — J9611 Chronic respiratory failure with hypoxia: Secondary | ICD-10-CM

## 2023-11-24 DIAGNOSIS — J441 Chronic obstructive pulmonary disease with (acute) exacerbation: Secondary | ICD-10-CM | POA: Diagnosis not present

## 2023-11-24 DIAGNOSIS — R739 Hyperglycemia, unspecified: Secondary | ICD-10-CM | POA: Diagnosis not present

## 2023-11-24 DIAGNOSIS — F419 Anxiety disorder, unspecified: Secondary | ICD-10-CM | POA: Diagnosis not present

## 2023-11-24 DIAGNOSIS — Z87891 Personal history of nicotine dependence: Secondary | ICD-10-CM

## 2023-11-24 DIAGNOSIS — T50905A Adverse effect of unspecified drugs, medicaments and biological substances, initial encounter: Secondary | ICD-10-CM | POA: Insufficient documentation

## 2023-11-24 LAB — BASIC METABOLIC PANEL
Anion gap: 10 (ref 5–15)
BUN: 15 mg/dL (ref 8–23)
CO2: 34 mmol/L — ABNORMAL HIGH (ref 22–32)
Calcium: 9.3 mg/dL (ref 8.9–10.3)
Chloride: 94 mmol/L — ABNORMAL LOW (ref 98–111)
Creatinine, Ser: 0.85 mg/dL (ref 0.44–1.00)
GFR, Estimated: >60 mL/min (ref >60)
Glucose, Bld: 147 mg/dL — ABNORMAL HIGH (ref 70–99)
Potassium: 4.7 mmol/L (ref 3.5–5.1)
Sodium: 138 mmol/L (ref 135–145)

## 2023-11-24 LAB — GLUCOSE, CAPILLARY
Glucose-Capillary: 159 mg/dL — ABNORMAL HIGH (ref 70–99)
Glucose-Capillary: 170 mg/dL — ABNORMAL HIGH (ref 70–99)

## 2023-11-24 LAB — CBC
HCT: 39.7 % (ref 36.0–46.0)
Hemoglobin: 12.5 g/dL (ref 12.0–15.0)
MCH: 31.1 pg (ref 26.0–34.0)
MCHC: 31.5 g/dL (ref 30.0–36.0)
MCV: 98.8 fL (ref 80.0–100.0)
Platelets: 312 10*3/uL (ref 150–400)
RBC: 4.02 MIL/uL (ref 3.87–5.11)
RDW: 13.2 % (ref 11.5–15.5)
WBC: 11 10*3/uL — ABNORMAL HIGH (ref 4.0–10.5)
nRBC: 0 % (ref 0.0–0.2)

## 2023-11-24 MED ORDER — FUROSEMIDE 10 MG/ML IJ SOLN
40.0000 mg | Freq: Once | INTRAMUSCULAR | Status: AC
  Administered 2023-11-24: 40 mg via INTRAVENOUS
  Filled 2023-11-24: qty 4

## 2023-11-24 MED ORDER — PREDNISONE 10 MG PO TABS: ORAL_TABLET | ORAL | 0 refills | Status: AC

## 2023-11-24 MED ORDER — GUAIFENESIN ER 600 MG PO TB12: 600.0000 mg | ORAL_TABLET | Freq: Two times a day (BID) | ORAL | 0 refills | Status: AC

## 2023-11-24 MED ORDER — GUAIFENESIN 100 MG/5ML PO LIQD: 5.0000 mL | ORAL | 0 refills | Status: AC | PRN

## 2023-11-24 MED ORDER — IPRATROPIUM-ALBUTEROL 0.5-2.5 (3) MG/3ML IN SOLN
3.0000 mL | Freq: Three times a day (TID) | RESPIRATORY_TRACT | Status: DC
  Filled 2023-11-24: qty 3

## 2023-11-24 NOTE — Discharge Summary (Signed)
Physician Discharge Summary   Patient: Heidi [name] MRN: [id number] DOB: November 22, 1955  Admit date:     11/21/2023  Discharge date: 11/24/23  Discharge Physician: Marcelino Duster   PCP: Patrice Paradise, MD   Recommendations at discharge:  {Tip this will not be part of the note when signed- Example include specific recommendations for outpatient follow-up, pending tests to follow-up on. (Optional):26781}  PCP follow up in 1 week. Advised to follow up with pulmonary as scheduled.  Discharge Diagnoses: Principal Problem:   COPD with acute exacerbation (HCC) Active Problems:   Smoking history   Chronic respiratory failure (HCC)   Anxiety   Hyperglycemia, drug-induced  Resolved Problems:   * No resolved hospital problems. [facility] Course: No notes on file  Assessment and Plan: No notes have been filed under this hospital service. Service: Hospitalist     {Tip this will not be part of the note when signed Body mass index is 26.27 kg/m. , ,  (Optional):26781}  {(NOTE) Pain control PDMP Statment (Optional):26782} Consultants: none Procedures performed: ***  Disposition: Home Diet recommendation:  Discharge Diet Orders (From admission, onward)     Start     Ordered   11/24/23 0000  Diet - low sodium heart healthy        11/24/23 1143           Cardiac diet DISCHARGE MEDICATION: Allergies as of 11/24/2023       Reactions   Prozac [fluoxetine] Itching   Budesonide    Other reaction(s): Dizziness (per pt: still takes this medication)   Levaquin [levofloxacin In D5w] Hives   Propranolol Other (See Comments)   dizziness   Zoloft [sertraline Hcl] Other (See Comments)   Increased anxiety        Medication List     PAUSE taking these medications    predniSONE 5 MG tablet Wait to take this until: December 06, 2023 Commonly known as: DELTASONE Take 5 mg by mouth daily. You also have another medication with the same name that you may need to  continue taking.       STOP taking these medications    methylPREDNISolone 4 MG Tbpk tablet Commonly known as: MEDROL DOSEPAK       TAKE these medications    albuterol 108 (90 Base) MCG/ACT inhaler Commonly known as: VENTOLIN HFA Inhale 1-2 puffs into the lungs every 4 (four) hours as needed for wheezing or shortness of breath.   albuterol (2.5 MG/3ML) 0.083% nebulizer solution Commonly known as: PROVENTIL Take 3 mLs (2.5 mg total) by nebulization every 6 (six) hours as needed for wheezing or shortness of breath.   ALPRAZolam 0.5 MG tablet Commonly known as: XANAX Take 0.5 mg by mouth 2 (two) times daily.   busPIRone 10 MG tablet Commonly known as: BUSPAR Take 1 tablet by mouth 3 (three) times daily.   Combivent Respimat 20-100 MCG/ACT Aers respimat Generic drug: Ipratropium-Albuterol INHALE 1 PUFF BY MOUTH EVERY 6 HOURS AS NEEDED FOR WHEEZING   ipratropium-albuterol 0.5-2.5 (3) MG/3ML Soln Commonly known as: DUONEB Take 3 mLs by nebulization every 4 (four) hours as needed.   Dupixent 300 MG/2ML Soaj Generic drug: Dupilumab Inject 300 mg into the skin every 14 (fourteen) days. mondays   EPINEPHrine 0.3 mg/0.3 mL Soaj injection Commonly known as: EPI-PEN Inject into the muscle.   escitalopram 20 MG tablet Commonly known as: LEXAPRO Take 1 tablet by mouth daily.   fluticasone 50 MCG/ACT nasal spray Commonly known as: FLONASE USE  2 SPRAYS IN EACH NOSTRIL ONCE DAILY   guaiFENesin 100 MG/5ML liquid Commonly known as: ROBITUSSIN Take 5 mLs by mouth every 4 (four) hours as needed for cough or to loosen phlegm.   guaiFENesin 600 MG 12 hr tablet Commonly known as: Mucinex Take 1 tablet (600 mg total) by mouth 2 (two) times daily for 15 days.   mirtazapine 30 MG tablet Commonly known as: REMERON Take 15 mg by mouth at bedtime.   montelukast 10 MG tablet Commonly known as: SINGULAIR Take 10 mg by mouth at bedtime.   mupirocin ointment 2 % Commonly known  as: BACTROBAN Apply 1 application topically daily. Apply to wound QD until healed.   Ohtuvayre 3 MG/2.5ML Susp Generic drug: Ensifentrine Take 1 vial by nebulization in the morning and at bedtime.   predniSONE 10 MG tablet Commonly known as: DELTASONE Take 4 tablets (40 mg total) by mouth daily for 3 days, THEN 3 tablets (30 mg total) daily for 3 days, THEN 2 tablets (20 mg total) daily for 3 days, THEN 1 tablet (10 mg total) daily for 3 days. Start taking on: November 24, 2023 What changed: Another medication with the same name was paused. Ask your nurse or doctor if you should take this medication.   sulfamethoxazole-trimethoprim 400-80 MG tablet Commonly known as: BACTRIM Take 1 tablet by mouth 3 (three) times a week.   Trelegy Ellipta 100-62.5-25 MCG/INH Aepb Generic drug: Fluticasone-Umeclidin-Vilant INHALE 1 PUFF ONCE DAILY -  NEED  APPT  FOR  FURTHER  REFILLS        Discharge Exam: Filed Weights   11/21/23 0905 11/23/23 0639  Weight: 70.3 kg 71.6 kg   ***  Condition at discharge: stable  The results of significant diagnostics from this hospitalization (including imaging, microbiology, ancillary and laboratory) are listed below for reference.   Imaging Studies: DG Chest 2 View Result Date: 11/21/2023 CLINICAL DATA:  Cough. EXAM: CHEST - 2 VIEW COMPARISON:  Chest radiograph dated June 26, 2023. CT chest dated October 02, 2023. FINDINGS: Stable cardiomediastinal silhouette. Hyperinflation with emphysematous changes, compatible with COPD. No focal consolidation, pleural effusion, or pneumothorax. No acute osseous abnormality. IMPRESSION: 1. No acute cardiopulmonary findings. 2. COPD. Electronically Signed   By: Hart Robinsons M.D.   On: 11/21/2023 09:48   CT CHEST LUNG CA SCREEN LOW DOSE W/O CM Result Date: 11/12/2023 CLINICAL DATA:  68 year old female former smoker (quit in 2014) with 41 pack-year history of smoking. Lung cancer screening examination. EXAM: CT CHEST  WITHOUT CONTRAST LOW-DOSE FOR LUNG CANCER SCREENING TECHNIQUE: Multidetector CT imaging of the chest was performed following the standard protocol without IV contrast. RADIATION DOSE REDUCTION: This exam was performed according to the departmental dose-optimization program which includes automated exposure control, adjustment of the mA and/or kV according to patient size and/or use of iterative reconstruction technique. COMPARISON:  Chest CTA 03/26/2023. Low-dose lung cancer screening chest CT 10/26/2022. FINDINGS: Cardiovascular: Heart size is normal. There is no significant pericardial fluid, thickening or pericardial calcification. Aortic atherosclerosis. No definite coronary artery calcifications. Mediastinum/Nodes: No pathologically enlarged mediastinal or hilar lymph nodes. Please note that accurate exclusion of hilar adenopathy is limited on noncontrast CT scans. Esophagus is unremarkable in appearance. No axillary lymphadenopathy. Lungs/Pleura: Small pulmonary nodules are noted, largest of which is in the superior segment of the left lower lobe (axial image 82) with a volume derived mean diameter of 3.1 mm. No other larger more suspicious appearing pulmonary nodules or masses are noted. No acute consolidative airspace disease.  No pleural effusions. Mild diffuse bronchial wall thickening with moderate centrilobular and paraseptal emphysema. Bilateral apical nodular pleuroparenchymal thickening and architectural distortion, some of which is calcified, similar to the prior study, most compatible with areas of chronic post infectious or inflammatory scarring. Upper Abdomen: Aortic atherosclerosis.  Status post cholecystectomy. Musculoskeletal: There are no aggressive appearing lytic or blastic lesions noted in the visualized portions of the skeleton. IMPRESSION: 1. Lung-RADS 2, benign appearance or behavior. Continue annual screening with low-dose chest CT without contrast in 12 months. 2. Aortic  atherosclerosis. 3. Mild diffuse bronchial wall thickening with moderate centrilobular and paraseptal emphysema; imaging findings suggestive of underlying COPD. Aortic Atherosclerosis (ICD10-I70.0) and Emphysema (ICD10-J43.9). Electronically Signed   By: Trudie Reed M.D.   On: 11/12/2023 10:59    Microbiology: Results for orders placed or performed during the hospital encounter of 11/21/23  Resp panel by RT-PCR (RSV, Flu A&B, Covid) Anterior Nasal Swab     Status: None   Collection Time: 11/21/23 10:33 AM   Specimen: Anterior Nasal Swab  Result Value Ref Range Status   SARS Coronavirus 2 by RT PCR NEGATIVE NEGATIVE Final    Comment: (NOTE) SARS-CoV-2 target nucleic acids are NOT DETECTED.  The SARS-CoV-2 RNA is generally detectable in upper respiratory specimens during the acute phase of infection. The lowest concentration of SARS-CoV-2 viral copies this assay can detect is 138 copies/mL. A negative result does not preclude SARS-Cov-2 infection and should not be used as the sole basis for treatment or other patient management decisions. A negative result may occur with  improper specimen collection/handling, submission of specimen other than nasopharyngeal swab, presence of viral mutation(s) within the areas targeted by this assay, and inadequate number of viral copies(<138 copies/mL). A negative result must be combined with clinical observations, patient history, and epidemiological information. The expected result is Negative.  Fact Sheet for Patients:  BloggerCourse.com  Fact Sheet for Healthcare Providers:  SeriousBroker.it  This test is no t yet approved or cleared by the [place] FDA and  has been authorized for detection and/or diagnosis of SARS-CoV-2 by FDA under an Emergency Use Authorization (EUA). This EUA will remain  in effect (meaning this test can be used) for the duration of the COVID-19 declaration under  Section 564(b)(1) of the Act, 21 U.S.C.section 360bbb-3(b)(1), unless the authorization is terminated  or revoked sooner.       Influenza A by PCR NEGATIVE NEGATIVE Final   Influenza B by PCR NEGATIVE NEGATIVE Final    Comment: (NOTE) The Xpert Xpress SARS-CoV-2/FLU/RSV plus assay is intended as an aid in the diagnosis of influenza from Nasopharyngeal swab specimens and should not be used as a sole basis for treatment. Nasal washings and aspirates are unacceptable for Xpert Xpress SARS-CoV-2/FLU/RSV testing.  Fact Sheet for Patients: BloggerCourse.com  Fact Sheet for Healthcare Providers: SeriousBroker.it  This test is not yet approved or cleared by the [place] FDA and has been authorized for detection and/or diagnosis of SARS-CoV-2 by FDA under an Emergency Use Authorization (EUA). This EUA will remain in effect (meaning this test can be used) for the duration of the COVID-19 declaration under Section 564(b)(1) of the Act, 21 U.S.C. section 360bbb-3(b)(1), unless the authorization is terminated or revoked.     Resp Syncytial Virus by PCR NEGATIVE NEGATIVE Final    Comment: (NOTE) Fact Sheet for Patients: BloggerCourse.com  Fact Sheet for Healthcare Providers: SeriousBroker.it  This test is not yet approved or cleared by the [place] FDA and has been  authorized for detection and/or diagnosis of SARS-CoV-2 by FDA under an Emergency Use Authorization (EUA). This EUA will remain in effect (meaning this test can be used) for the duration of the COVID-19 declaration under Section 564(b)(1) of the Act, 21 U.S.C. section 360bbb-3(b)(1), unless the authorization is terminated or revoked.  Performed at [facility], [address] [place], [place] [address]   Respiratory (~20 pathogens) panel by PCR     Status: None   Collection Time: 11/21/23 12:38 PM    Specimen: Nasopharyngeal Swab; Respiratory  Result Value Ref Range Status   Adenovirus NOT DETECTED NOT DETECTED Final   Coronavirus 229E NOT DETECTED NOT DETECTED Final    Comment: (NOTE) The Coronavirus on the Respiratory Panel, DOES NOT test for the novel  Coronavirus (2019 nCoV)    Coronavirus HKU1 NOT DETECTED NOT DETECTED Final   Coronavirus NL63 NOT DETECTED NOT DETECTED Final   Coronavirus OC43 NOT DETECTED NOT DETECTED Final   Metapneumovirus NOT DETECTED NOT DETECTED Final   Rhinovirus / Enterovirus NOT DETECTED NOT DETECTED Final   Influenza A NOT DETECTED NOT DETECTED Final   Influenza B NOT DETECTED NOT DETECTED Final   Parainfluenza Virus 1 NOT DETECTED NOT DETECTED Final   Parainfluenza Virus 2 NOT DETECTED NOT DETECTED Final   Parainfluenza Virus 3 NOT DETECTED NOT DETECTED Final   Parainfluenza Virus 4 NOT DETECTED NOT DETECTED Final   Respiratory Syncytial Virus NOT DETECTED NOT DETECTED Final   Bordetella pertussis NOT DETECTED NOT DETECTED Final   Bordetella Parapertussis NOT DETECTED NOT DETECTED Final   Chlamydophila pneumoniae NOT DETECTED NOT DETECTED Final   Mycoplasma pneumoniae NOT DETECTED NOT DETECTED Final    Comment: Performed at [facility], [address], [place], [place] [address]    Labs: CBC: Recent Labs  Lab 11/21/23 1002 11/24/23 0418  WBC 11.7* 11.0*  HGB 12.9 12.5  HCT 39.3 39.7  MCV 94.9 98.8  PLT 287 312   Basic Metabolic Panel: Recent Labs  Lab 11/21/23 1002 11/24/23 0418  NA 136 138  K 3.6 4.7  CL 97* 94*  CO2 31 34*  GLUCOSE 169* 147*  BUN 8 15  CREATININE 0.77 0.85  CALCIUM 8.8* 9.3   Liver Function Tests: No results for input(s): "AST", "ALT", "ALKPHOS", "BILITOT", "PROT", "ALBUMIN" in the last 168 hours. CBG: Recent Labs  Lab 11/23/23 0807 11/23/23 1121 11/23/23 1717 11/24/23 0748 11/24/23 1139  GLUCAP 137* 179* 119* 159* 170*    Discharge time spent: 36 minutes.  Signed: Marcelino Duster,  MD [facility] 11/24/2023

## 2023-11-24 NOTE — Plan of Care (Signed)

## 2023-11-24 NOTE — Plan of Care (Addendum)
  Problem: Coping: Goal: Ability to adjust to condition or change in health will improve Outcome: Progressing   Problem: Health Behavior/Discharge Planning: Goal: Ability to manage health-related needs will improve Outcome: Progressing   Problem: Education: Goal: Knowledge of General Education information will improve Description: Including pain rating scale, medication(s)/side effects and non-pharmacologic comfort measures Outcome: Progressing   Problem: Clinical Measurements: Goal: Will remain free from infection Outcome: Progressing   Problem: Pain Managment: Goal: General experience of comfort will improve and/or be controlled Outcome:  Progressing   Problem: Clinical Measurements: Goal: Ability to maintain clinical measurements within normal limits will improve Outcome: Not Progressing Goal: Cardiovascular complication will be avoided Outcome: Progressing

## 2023-11-26 LAB — GLUCOSE, CAPILLARY: Glucose-Capillary: 127 mg/dL — ABNORMAL HIGH (ref 70–99)

## 2024-04-01 ENCOUNTER — Encounter: Payer: Self-pay | Admitting: Dermatology

## 2024-04-01 ENCOUNTER — Ambulatory Visit (INDEPENDENT_AMBULATORY_CARE_PROVIDER_SITE_OTHER): Admitting: Dermatology

## 2024-04-01 DIAGNOSIS — H01113 Allergic dermatitis of right eye, unspecified eyelid: Secondary | ICD-10-CM | POA: Diagnosis not present

## 2024-04-01 DIAGNOSIS — Z7189 Other specified counseling: Secondary | ICD-10-CM | POA: Diagnosis not present

## 2024-04-01 DIAGNOSIS — H01116 Allergic dermatitis of left eye, unspecified eyelid: Secondary | ICD-10-CM

## 2024-04-01 DIAGNOSIS — L2389 Allergic contact dermatitis due to other agents: Secondary | ICD-10-CM

## 2024-04-01 DIAGNOSIS — H01119 Allergic dermatitis of unspecified eye, unspecified eyelid: Secondary | ICD-10-CM

## 2024-04-01 DIAGNOSIS — Z79899 Other long term (current) drug therapy: Secondary | ICD-10-CM

## 2024-04-01 DIAGNOSIS — R21 Rash and other nonspecific skin eruption: Secondary | ICD-10-CM

## 2024-04-01 MED ORDER — MOMETASONE FUROATE 0.1 % EX OINT: TOPICAL_OINTMENT | Freq: Two times a day (BID) | CUTANEOUS | 0 refills | Status: DC

## 2024-04-01 NOTE — Patient Instructions (Signed)

## 2024-04-01 NOTE — Progress Notes (Signed)
   Follow-Up Visit   Subjective  Heidi [name] is a 68 y.o. female who presents for the following: Rash periocular ~1.32m, no symptoms, pt used Neomycin-polymyyxin-dexamethasone  ophthalmic suspension x 10 days, improved The patient has spots, moles and lesions to be evaluated, some may be new or changing and the patient may have concern these could be cancer.  Patient accompanied by partner.  The following portions of the chart were reviewed this encounter and updated as appropriate: medications, allergies, medical history  Review of Systems:  No other skin or systemic complaints except as noted in HPI or Assessment and Plan.  Objective  Well appearing patient in no apparent distress; mood and affect are within normal limits.   A focused examination was performed of the following areas: face  Relevant exam findings are noted in the Assessment and Plan.          Assessment & Plan   ALLERGIC CONTACT DERMATITIS  L and R eyelids Exam: bil eyelids healing some mild edema and erythema L >R   Treatment Plan: Detailed counseling with patient and husband today. Discussed TRUE Patch Test 36 True Patch Test 36 applied today Start Mometasone  ointment bid to eyelids  Topical steroids (such as triamcinolone, fluocinolone, fluocinonide, mometasone , clobetasol, halobetasol, betamethasone, hydrocortisone) can cause thinning and lightening of the skin if they are used for too long in the same area. Your physician has selected the right strength medicine for your problem and area affected on the body. Please use your medication only as directed by your physician to prevent side effects.   ALLERGIC CONTACT DERMATITIS DUE TO OTHER AGENTS   Related Procedures Patch Test RASH   EYELID DERMATITIS, ALLERGIC/CONTACT   COUNSELING AND COORDINATION OF CARE   MEDICATION MANAGEMENT    Return for 2 days with nurse for patch test f/u, 1 wk with Dr K for patch test f/u.  I, Rollie Clipper, RMA, am acting as scribe for Celine Collard, MD .   Documentation: I have reviewed the above documentation for accuracy and completeness, and I agree with the above.  Celine Collard, MD

## 2024-04-03 ENCOUNTER — Ambulatory Visit

## 2024-04-03 DIAGNOSIS — H01116 Allergic dermatitis of left eye, unspecified eyelid: Secondary | ICD-10-CM

## 2024-04-03 DIAGNOSIS — H01113 Allergic dermatitis of right eye, unspecified eyelid: Secondary | ICD-10-CM

## 2024-04-03 NOTE — Progress Notes (Addendum)
 Patient here today for Day 3 of True Test. Panels removed and patient is with significant other. Significant other advised how to read panels Friday through Monday and patient advised do not soak nor scrub testing area. Photos taken. Panel 3 was not fully intact.   Patient showed no signs of reaction today.   Lisbeth Rides, RMA

## 2024-04-08 ENCOUNTER — Ambulatory Visit: Admitting: Dermatology

## 2024-04-08 ENCOUNTER — Encounter: Payer: Self-pay | Admitting: Dermatology

## 2024-04-08 DIAGNOSIS — H01113 Allergic dermatitis of right eye, unspecified eyelid: Secondary | ICD-10-CM

## 2024-04-08 DIAGNOSIS — Z79899 Other long term (current) drug therapy: Secondary | ICD-10-CM

## 2024-04-08 DIAGNOSIS — H01116 Allergic dermatitis of left eye, unspecified eyelid: Secondary | ICD-10-CM

## 2024-04-08 DIAGNOSIS — L820 Inflamed seborrheic keratosis: Secondary | ICD-10-CM

## 2024-04-08 DIAGNOSIS — Z7189 Other specified counseling: Secondary | ICD-10-CM

## 2024-04-08 DIAGNOSIS — H01119 Allergic dermatitis of unspecified eye, unspecified eyelid: Secondary | ICD-10-CM

## 2024-04-08 NOTE — Patient Instructions (Addendum)

## 2024-04-08 NOTE — Progress Notes (Signed)
   Follow-Up Visit   Subjective  SYDNEY HASTEN is a 68 y.o. female who presents for the following: Patient here for final patch test reading, patient report rash on her eyelids are clear from using Mometasone  cream.   The patient has spots, moles and lesions to be evaluated, some may be new or changing and the patient may have concern these could be cancer.  Husband is with patient and contributes to history.   The following portions of the chart were reviewed this encounter and updated as appropriate: medications, allergies, medical history  Review of Systems:  No other skin or systemic complaints except as noted in HPI or Assessment and Plan.  Objective  Well appearing patient in no apparent distress; mood and affect are within normal limits.  A focused examination was performed of the following areas:  Relevant exam findings are noted in the Assessment and Plan.  back,arms (6) Stuck-on, waxy, tan-brown papules and plaques -- Discussed benign etiology and prognosis.   Assessment & Plan   Eyelid DERMATITIS  Left eyelid and right eyelids- clear today  No reactions from True patch test  Patient instructed watch her back for another couple of weeks to see if any positives appear.  May consider expanded patch testing in the future if persistent or recurrent problems.  Stop Mometasone  cream, use only prn flares    INFLAMED SEBORRHEIC KERATOSIS (6) back,arms (6) Symptomatic, irritating, patient would like treated.  Destruction of lesion - back,arms (6) Complexity: simple   Destruction method: cryotherapy   Informed consent: discussed and consent obtained   Timeout:  patient name, date of birth, surgical site, and procedure verified Lesion destroyed using liquid nitrogen: Yes   Region frozen until ice ball extended beyond lesion: Yes   Outcome: patient tolerated procedure well with no complications   Post-procedure details: wound care instructions given   EYELID DERMATITIS,  ALLERGIC/CONTACT   MEDICATION MANAGEMENT   COUNSELING AND COORDINATION OF CARE    Return if symptoms worsen or fail to improve.  IClara Crisp, CMA, am acting as scribe for Celine Collard, MD .   Documentation: I have reviewed the above documentation for accuracy and completeness, and I agree with the above.  Celine Collard, MD

## 2024-08-19 ENCOUNTER — Telehealth: Payer: Self-pay

## 2024-08-19 NOTE — Telephone Encounter (Signed)
 Patient call LM on VM she would like someone to call her.    I called patient LM on VM returning her call, please call back

## 2024-08-21 ENCOUNTER — Ambulatory Visit (INDEPENDENT_AMBULATORY_CARE_PROVIDER_SITE_OTHER): Admitting: Dermatology

## 2024-08-21 ENCOUNTER — Encounter: Payer: Self-pay | Admitting: Dermatology

## 2024-08-21 DIAGNOSIS — H01119 Allergic dermatitis of unspecified eye, unspecified eyelid: Secondary | ICD-10-CM | POA: Diagnosis not present

## 2024-08-21 MED ORDER — PIMECROLIMUS 1 % EX CREA: TOPICAL_CREAM | Freq: Two times a day (BID) | CUTANEOUS | 6 refills | Status: DC

## 2024-08-21 NOTE — Progress Notes (Signed)
   Follow-Up Visit   Subjective  Heidi [name] is a 68 y.o. female who presents for the following: f/u on rash on the left upper eyelid that started ~6 months ago, treating with Mometasone  ointment helps but rash never goes away. Patient had patch testing here   The following portions of the chart were reviewed this encounter and updated as appropriate: medications, allergies, medical history  Review of Systems:  No other skin or systemic complaints except as noted in HPI or Assessment and Plan.  Objective  Well appearing patient in no apparent distress; mood and affect are within normal limits.  A focused examination was performed of the following areas: Face, yelids  Relevant exam findings are noted in the Assessment and Plan.       Assessment & Plan   Eyelid DERMATITIS  Left eyelid and right eyelids- clear today  No reactions from True patch test   May consider expanded patch testing at [facility] in the future if persistent or recurrent problems.  Nail-Fungal-ID/Derm-ID Molecular Diagnostic test performed today.  Discussed with patient their insurance will be billed.  Advised the patient they may get a bill for a portion that's not covered by their insurance.  Should the patient have any issues with their remaining responsibility they will not be sent to collections but KRISTINE will work with them internally on any remaining balance.     Start Elidel cream apply to affected skin twice daily Continue Mometasone  ointment qd-bid up to 3 days per week if needed for flares or if Elidel not controlling enough prn   May consider adding Metro lotion in the future (Demodex dermatitis?) - pending Molecular study results  Dr Raymund evaluated pt today with me and gave her opinion. EYELID DERMATITIS, ALLERGIC/CONTACT    Return in about 3 months (around 11/21/2024).  IFay Kirks, CMA, am acting as scribe for Alm Rhyme, MD .   Documentation: I have reviewed the above  documentation for accuracy and completeness, and I agree with the above.  Alm Rhyme, MD

## 2024-08-21 NOTE — Patient Instructions (Signed)

## 2024-08-25 ENCOUNTER — Telehealth: Payer: Self-pay

## 2024-08-25 NOTE — Telephone Encounter (Signed)
 Please see molecular pathology report scanned under media and advise of results. Thank you.

## 2024-08-26 MED ORDER — KETOCONAZOLE 2 % EX CREA: TOPICAL_CREAM | CUTANEOUS | 1 refills | Status: AC

## 2024-08-26 NOTE — Addendum Note (Signed)
 Addended by: WILLMA KNEE A on: 08/26/2024 03:55 PM   Modules accepted: Orders

## 2024-09-01 ENCOUNTER — Emergency Department

## 2024-09-01 ENCOUNTER — Other Ambulatory Visit: Payer: Self-pay

## 2024-09-01 ENCOUNTER — Inpatient Hospital Stay
Admission: EM | Admit: 2024-09-01 | Discharge: 2024-09-07 | DRG: 189 | Disposition: A | Discharge status: Home Health | Attending: Hospitalist | Admitting: Hospitalist

## 2024-09-01 DIAGNOSIS — Z9981 Dependence on supplemental oxygen: Secondary | ICD-10-CM

## 2024-09-01 DIAGNOSIS — Z823 Family history of stroke: Secondary | ICD-10-CM | POA: Diagnosis not present

## 2024-09-01 DIAGNOSIS — K219 Gastro-esophageal reflux disease without esophagitis: Secondary | ICD-10-CM | POA: Diagnosis present

## 2024-09-01 DIAGNOSIS — J9621 Acute and chronic respiratory failure with hypoxia: Secondary | ICD-10-CM | POA: Diagnosis present

## 2024-09-01 DIAGNOSIS — B9710 Unspecified enterovirus as the cause of diseases classified elsewhere: Secondary | ICD-10-CM | POA: Diagnosis present

## 2024-09-01 DIAGNOSIS — Z7952 Long term (current) use of systemic steroids: Secondary | ICD-10-CM

## 2024-09-01 DIAGNOSIS — F419 Anxiety disorder, unspecified: Secondary | ICD-10-CM | POA: Diagnosis present

## 2024-09-01 DIAGNOSIS — Z87891 Personal history of nicotine dependence: Secondary | ICD-10-CM | POA: Diagnosis not present

## 2024-09-01 DIAGNOSIS — I252 Old myocardial infarction: Secondary | ICD-10-CM | POA: Diagnosis not present

## 2024-09-01 DIAGNOSIS — B9789 Other viral agents as the cause of diseases classified elsewhere: Secondary | ICD-10-CM | POA: Diagnosis present

## 2024-09-01 DIAGNOSIS — I5032 Chronic diastolic (congestive) heart failure: Secondary | ICD-10-CM | POA: Diagnosis present

## 2024-09-01 DIAGNOSIS — Z8249 Family history of ischemic heart disease and other diseases of the circulatory system: Secondary | ICD-10-CM

## 2024-09-01 DIAGNOSIS — Z792 Long term (current) use of antibiotics: Secondary | ICD-10-CM | POA: Diagnosis not present

## 2024-09-01 DIAGNOSIS — J159 Unspecified bacterial pneumonia: Secondary | ICD-10-CM | POA: Diagnosis present

## 2024-09-01 DIAGNOSIS — Z79899 Other long term (current) drug therapy: Secondary | ICD-10-CM

## 2024-09-01 DIAGNOSIS — F32A Depression, unspecified: Secondary | ICD-10-CM | POA: Diagnosis present

## 2024-09-01 DIAGNOSIS — J441 Chronic obstructive pulmonary disease with (acute) exacerbation: Secondary | ICD-10-CM | POA: Diagnosis present

## 2024-09-01 DIAGNOSIS — J440 Chronic obstructive pulmonary disease with acute lower respiratory infection: Secondary | ICD-10-CM | POA: Diagnosis present

## 2024-09-01 LAB — COMPREHENSIVE METABOLIC PANEL WITH GFR
ALT: 25 U/L (ref 0–44)
AST: 26 U/L (ref 15–41)
Albumin: 3.9 g/dL (ref 3.5–5.0)
Alkaline Phosphatase: 133 U/L — ABNORMAL HIGH (ref 38–126)
Anion gap: 12 (ref 5–15)
BUN: 7 mg/dL — ABNORMAL LOW (ref 8–23)
CO2: 34 mmol/L — ABNORMAL HIGH (ref 22–32)
Calcium: 9.1 mg/dL (ref 8.9–10.3)
Chloride: 94 mmol/L — ABNORMAL LOW (ref 98–111)
Creatinine, Ser: 0.78 mg/dL (ref 0.44–1.00)
GFR, Estimated: >60 mL/min (ref >60)
Glucose, Bld: 122 mg/dL — ABNORMAL HIGH (ref 70–99)
Potassium: 3.8 mmol/L (ref 3.5–5.1)
Sodium: 140 mmol/L (ref 135–145)
Total Bilirubin: 0.6 mg/dL (ref 0.0–1.2)
Total Protein: 8 g/dL (ref 6.5–8.1)

## 2024-09-01 LAB — RESP PANEL BY RT-PCR (RSV, FLU A&B, COVID)  RVPGX2
Influenza A by PCR: NEGATIVE
Influenza B by PCR: NEGATIVE
Resp Syncytial Virus by PCR: NEGATIVE
SARS Coronavirus 2 by RT PCR: NEGATIVE

## 2024-09-01 LAB — CBC WITH DIFFERENTIAL/PLATELET
Abs Immature Granulocytes: 0.05 K/uL (ref 0.00–0.07)
Basophils Absolute: 0 K/uL (ref 0.0–0.1)
Basophils Relative: 0 %
Eosinophils Absolute: 0.2 K/uL (ref 0.0–0.5)
Eosinophils Relative: 2 %
HCT: 40.3 % (ref 36.0–46.0)
Hemoglobin: 12.5 g/dL (ref 12.0–15.0)
Immature Granulocytes: 1 %
Lymphocytes Relative: 25 %
Lymphs Abs: 2.5 K/uL (ref 0.7–4.0)
MCH: 30.6 pg (ref 26.0–34.0)
MCHC: 31 g/dL (ref 30.0–36.0)
MCV: 98.5 fL (ref 80.0–100.0)
Monocytes Absolute: 0.8 K/uL (ref 0.1–1.0)
Monocytes Relative: 8 %
Neutro Abs: 6.3 K/uL (ref 1.7–7.7)
Neutrophils Relative %: 64 %
Platelets: 296 K/uL (ref 150–400)
RBC: 4.09 MIL/uL (ref 3.87–5.11)
RDW: 13.2 % (ref 11.5–15.5)
WBC: 9.8 K/uL (ref 4.0–10.5)
nRBC: 0 % (ref 0.0–0.2)

## 2024-09-01 MED ORDER — PREDNISONE 20 MG PO TABS: 40.0000 mg | ORAL_TABLET | Freq: Every day | ORAL | Status: DC

## 2024-09-01 MED ORDER — ONDANSETRON HCL 4 MG PO TABS: 4.0000 mg | ORAL_TABLET | Freq: Four times a day (QID) | ORAL | Status: DC | PRN

## 2024-09-01 MED ORDER — ONDANSETRON HCL 4 MG/2ML IJ SOLN: 4.0000 mg | Freq: Four times a day (QID) | INTRAMUSCULAR | Status: DC | PRN

## 2024-09-01 MED ORDER — BUSPIRONE HCL 10 MG PO TABS
10.0000 mg | ORAL_TABLET | Freq: Three times a day (TID) | ORAL | Status: DC
  Administered 2024-09-01 – 2024-09-07 (×18): 10 mg via ORAL
  Filled 2024-09-01 (×8): qty 1
  Filled 2024-09-01: qty 2
  Filled 2024-09-01 (×9): qty 1

## 2024-09-01 MED ORDER — DOXYCYCLINE HYCLATE 100 MG PO TABS
100.0000 mg | ORAL_TABLET | Freq: Two times a day (BID) | ORAL | Status: DC
  Administered 2024-09-01 – 2024-09-02 (×2): 100 mg via ORAL
  Filled 2024-09-01 (×2): qty 1

## 2024-09-01 MED ORDER — ENOXAPARIN SODIUM 40 MG/0.4ML IJ SOSY
40.0000 mg | PREFILLED_SYRINGE | INTRAMUSCULAR | Status: DC
  Administered 2024-09-01 – 2024-09-06 (×6): 40 mg via SUBCUTANEOUS
  Filled 2024-09-01 (×6): qty 0.4

## 2024-09-01 MED ORDER — SODIUM CHLORIDE 0.9 % IV SOLN
100.0000 mg | Freq: Once | INTRAVENOUS | Status: AC
  Administered 2024-09-01: 100 mg via INTRAVENOUS
  Filled 2024-09-01: qty 100

## 2024-09-01 MED ORDER — LORAZEPAM 2 MG/ML IJ SOLN
1.0000 mg | Freq: Once | INTRAMUSCULAR | Status: AC
  Administered 2024-09-01: 1 mg via INTRAVENOUS
  Filled 2024-09-01: qty 1

## 2024-09-01 MED ORDER — ESCITALOPRAM OXALATE 10 MG PO TABS
20.0000 mg | ORAL_TABLET | Freq: Every day | ORAL | Status: DC
  Administered 2024-09-02 – 2024-09-07 (×6): 20 mg via ORAL
  Filled 2024-09-01 (×6): qty 2

## 2024-09-01 MED ORDER — MAGNESIUM SULFATE 2 GM/50ML IV SOLN
2.0000 g | INTRAVENOUS | Status: AC
  Administered 2024-09-01: 2 g via INTRAVENOUS
  Filled 2024-09-01: qty 50

## 2024-09-01 MED ORDER — METHYLPREDNISOLONE SODIUM SUCC 40 MG IJ SOLR
40.0000 mg | Freq: Two times a day (BID) | INTRAMUSCULAR | Status: AC
  Administered 2024-09-01 – 2024-09-02 (×2): 40 mg via INTRAVENOUS
  Filled 2024-09-01 (×2): qty 1

## 2024-09-01 MED ORDER — GUAIFENESIN 100 MG/5ML PO LIQD
5.0000 mL | ORAL | Status: DC | PRN
Start: 2024-09-01 — End: 2024-09-07
  Administered 2024-09-01 – 2024-09-05 (×13): 5 mL via ORAL
  Filled 2024-09-01 (×14): qty 10

## 2024-09-01 MED ORDER — ACETAMINOPHEN 325 MG PO TABS: 650.0000 mg | ORAL_TABLET | Freq: Four times a day (QID) | ORAL | Status: DC | PRN

## 2024-09-01 MED ORDER — ACETAMINOPHEN 650 MG RE SUPP: 650.0000 mg | Freq: Four times a day (QID) | RECTAL | Status: DC | PRN

## 2024-09-01 MED ORDER — MELATONIN 5 MG PO TABS
2.5000 mg | ORAL_TABLET | Freq: Every day | ORAL | Status: DC
  Administered 2024-09-01 – 2024-09-06 (×6): 2.5 mg via ORAL
  Filled 2024-09-01 (×6): qty 1

## 2024-09-01 MED ORDER — FUROSEMIDE 10 MG/ML IJ SOLN
20.0000 mg | Freq: Once | INTRAMUSCULAR | Status: AC
  Administered 2024-09-01: 20 mg via INTRAVENOUS
  Filled 2024-09-01: qty 4

## 2024-09-01 MED ORDER — BUDESON-GLYCOPYRROL-FORMOTEROL 160-9-4.8 MCG/ACT IN AERO
2.0000 | INHALATION_SPRAY | Freq: Two times a day (BID) | RESPIRATORY_TRACT | Status: DC
  Administered 2024-09-01 – 2024-09-07 (×12): 2 via RESPIRATORY_TRACT
  Filled 2024-09-01 (×3): qty 5.9

## 2024-09-01 MED ORDER — ENSIFENTRINE 3 MG/2.5ML IN SUSP: 1.0000 | Freq: Two times a day (BID) | RESPIRATORY_TRACT | Status: DC

## 2024-09-01 MED ORDER — PIMECROLIMUS 1 % EX CREA: TOPICAL_CREAM | Freq: Two times a day (BID) | CUTANEOUS | Status: DC

## 2024-09-01 MED ORDER — IPRATROPIUM-ALBUTEROL 0.5-2.5 (3) MG/3ML IN SOLN
3.0000 mL | RESPIRATORY_TRACT | Status: DC
  Administered 2024-09-01 – 2024-09-03 (×14): 3 mL via RESPIRATORY_TRACT
  Filled 2024-09-01 (×13): qty 3

## 2024-09-01 MED ORDER — ALPRAZOLAM 0.5 MG PO TABS
0.5000 mg | ORAL_TABLET | Freq: Two times a day (BID) | ORAL | Status: DC
  Administered 2024-09-01 – 2024-09-07 (×12): 0.5 mg via ORAL
  Filled 2024-09-01 (×12): qty 1

## 2024-09-01 MED ORDER — ALBUTEROL SULFATE (2.5 MG/3ML) 0.083% IN NEBU: 3.0000 mL | INHALATION_SOLUTION | RESPIRATORY_TRACT | Status: DC | PRN

## 2024-09-01 MED ORDER — MIRTAZAPINE 15 MG PO TABS
15.0000 mg | ORAL_TABLET | Freq: Every day | ORAL | Status: DC
  Administered 2024-09-01 – 2024-09-06 (×6): 15 mg via ORAL
  Filled 2024-09-01 (×6): qty 1

## 2024-09-01 MED ORDER — ALBUTEROL SULFATE (2.5 MG/3ML) 0.083% IN NEBU
5.0000 mg | INHALATION_SOLUTION | Freq: Once | RESPIRATORY_TRACT | Status: AC
  Administered 2024-09-01: 5 mg via RESPIRATORY_TRACT
  Filled 2024-09-01: qty 6

## 2024-09-01 MED ORDER — SULFAMETHOXAZOLE-TRIMETHOPRIM 400-80 MG PO TABS: 1.0000 | ORAL_TABLET | ORAL | Status: DC

## 2024-09-01 MED ORDER — MONTELUKAST SODIUM 10 MG PO TABS
10.0000 mg | ORAL_TABLET | Freq: Every day | ORAL | Status: DC
  Administered 2024-09-01 – 2024-09-06 (×6): 10 mg via ORAL
  Filled 2024-09-01 (×6): qty 1

## 2024-09-01 NOTE — H&P (Signed)
 History and Physical    Heidi [name]:[id number] DOB: Jun 16, 1956 DOA: 09/01/2024  PCP: Marikay Eva POUR, PA (Confirm with patient/family/NH records and if not entered, this has to be entered at [facility] point of entry) Patient coming from: Home  I have personally briefly reviewed patient's old medical records in [facility]  Chief Complaint: Cough wheezing, SOB  HPI: Heidi [name] is a 68 y.o. female with medical history significant of COPD Gold stage IV on chronic antibiotics and steroid therapy, chronic hypoxic respiratory failure on 4 L and as needed BiPAP, chronic HFpEF presented with worsening of cough wheezing shortness of breath.  Symptoms started 4 days ago, when patient started having a dry cough, wheezing and increasing shortness of breath, she has been using home BiPAP device hooked with home oxygen with some help however last night her breathing symptoms became worse and could not sleep decided to come to the ED.  She also noticed bilateral ankle swelling.  No chest pains.  ED Course: Pulmonary tachycardia tachypneic breathing rate 20/30, O2 saturation 99% on 3 L and then switched to BiPAP.  Chest x-ray negative for acute infiltrates, blood work showed WBC 9.8 hemoglobin 12.5 BUN 7 creatinine 0.7 bicarb 34.  Patient was given IV Solu-Medrol , DuoNebs in the ED  Review of Systems: As per HPI otherwise 14 point review of systems negative.    Past Medical History:  Diagnosis Date   Anxiety    COPD (chronic obstructive pulmonary disease) (HCC)    GERD (gastroesophageal reflux disease)    Hypotension    limiting med titration   NSTEMI (non-ST elevated myocardial infarction) (HCC) 2013   12/2011 with normal coronaries possibly secondary to Takotsubo (EF 30-35% by echo), occurred following two episodes of acute respiratory distress   Pneumonia 11/2016   history of   Takotsubo syndrome 4/13    Past Surgical History:  Procedure Laterality Date   ABDOMINAL  HYSTERECTOMY     CARDIAC CATHETERIZATION     CHOLECYSTECTOMY N/A 06/05/2017   Procedure: LAPAROSCOPIC CHOLECYSTECTOMY;  Surgeon: Claudene Larinda Bolder, MD;  Location: [facility] ORS;  Service: General;  Laterality: N/A;   FRACTURE SURGERY Left 07/2016   leg compound fraction   LEFT HEART CATHETERIZATION WITH CORONARY ANGIOGRAM N/A 01/23/2012   Procedure: LEFT HEART CATHETERIZATION WITH CORONARY ANGIOGRAM;  Surgeon: Toribio JONELLE Fuel, MD;  Location: [facility] CATH LAB;  Service: Cardiovascular;  Laterality: N/A;   vagina rectal repair     after childbirth     reports that she quit smoking about 12 years ago. Her smoking use included cigarettes. She started smoking about 42 years ago. She has a 30 pack-year smoking history. She has never used smokeless tobacco. She reports that she does not currently use alcohol. She reports that she does not use drugs.  Allergies  Allergen Reactions   Prozac  [Fluoxetine ] Itching   Budesonide      Other reaction(s): Dizziness (per pt: still takes this medication)   Levaquin  [Levofloxacin  In D5w] Hives   Propranolol  Other (See Comments)    dizziness   Zoloft  [Sertraline  Hcl] Other (See Comments)    Increased anxiety    Family History  Problem Relation Age of Onset   Heart attack Mother        deceased from massive MI at age 47   Liver disease Father        fatty liver; living, age 88   Stroke Brother        at age 74, living  Hypertension Brother        living, age 66     Prior to Admission medications   Medication Sig Start Date End Date Taking? Authorizing Provider  albuterol  (PROVENTIL  HFA;VENTOLIN  HFA) 108 (90 Base) MCG/ACT inhaler Inhale 1-2 puffs into the lungs every 4 (four) hours as needed for wheezing or shortness of breath.    [provider]  albuterol  (PROVENTIL ) (2.5 MG/3ML) 0.083% nebulizer solution Take 3 mLs (2.5 mg total) by nebulization every 6 (six) hours as needed for wheezing or shortness of breath. 07/10/19   Parrett, Madelin RAMAN, NP   ALPRAZolam  (XANAX ) 0.5 MG tablet Take 0.5 mg by mouth 2 (two) times daily. 04/09/23   Aleskerov, Fuad, MD  busPIRone  (BUSPAR ) 10 MG tablet Take 1 tablet by mouth 3 (three) times daily. 03/26/23   Marikay Spear K, PA  DUPIXENT 300 MG/2ML SOPN Inject 300 mg into the skin every 14 (fourteen) days. mondays 06/09/22   [provider]  EPINEPHrine  0.3 mg/0.3 mL IJ SOAJ injection Inject into the muscle. 05/04/22   [provider]  escitalopram  (LEXAPRO ) 20 MG tablet Take 1 tablet by mouth daily. 02/22/23   McLaughlin, Miriam K, PA  fluticasone  (FLONASE ) 50 MCG/ACT nasal spray USE 2 SPRAYS IN EACH NOSTRIL ONCE DAILY 05/01/18   McQuaid, Douglas B, MD  guaiFENesin  (ROBITUSSIN) 100 MG/5ML liquid Take 5 mLs by mouth every 4 (four) hours as needed for cough or to loosen phlegm. 11/24/23   Darci Pore, MD  Ipratropium-Albuterol  (COMBIVENT  RESPIMAT) 20-100 MCG/ACT AERS respimat INHALE 1 PUFF BY MOUTH EVERY 6 HOURS AS NEEDED FOR WHEEZING 07/10/19   Parrett, Tammy S, NP  ipratropium-albuterol  (DUONEB) 0.5-2.5 (3) MG/3ML SOLN Take 3 mLs by nebulization every 4 (four) hours as needed. 11/05/20   Danford, Lonni SQUIBB, MD  ketoconazole (NIZORAL) 2 % cream Apply to aa rash on the eyelid QD x 2 mths. 08/26/24   Hester Alm BROCKS, MD  mirtazapine  (REMERON ) 30 MG tablet Take 15 mg by mouth at bedtime. 04/04/23   Aleskerov, Fuad, MD  mometasone  (ELOCON ) 0.1 % ointment Apply topically 2 (two) times daily. Bid to eyelids, avoid getting in eyes 04/01/24   Hester Alm BROCKS, MD  montelukast  (SINGULAIR ) 10 MG tablet Take 10 mg by mouth at bedtime.    [provider]  mupirocin  ointment (BACTROBAN ) 2 % Apply 1 application topically daily. Apply to wound QD until healed. Patient not taking: Reported on 06/28/2022 09/01/21   Moye, Virginia , MD  neomycin-polymyxin b-dexamethasone  (MAXITROL) 3.5-10000-0.1 SUSP INSTILL 1 DROP INTO EACH EYE TWICE DAILY FOR 10 DAYS 03/11/24   [provider]  OHTUVAYRE   3 MG/2.5ML SUSP Take 1 vial by nebulization in the morning and at bedtime. 09/24/23   [provider]  pimecrolimus (ELIDEL) 1 % cream Apply topically 2 (two) times daily. 08/21/24   Hester Alm BROCKS, MD  predniSONE  (DELTASONE ) 5 MG tablet Take 5 mg by mouth daily. 04/18/22   [provider]  sulfamethoxazole -trimethoprim  (BACTRIM ) 400-80 MG tablet Take 1 tablet by mouth 3 (three) times a week. 10/03/23 11/25/24  [provider]  TRELEGY ELLIPTA  100-62.5-25 MCG/INH AEPB INHALE 1 PUFF ONCE DAILY -  NEED  APPT  FOR  FURTHER  REFILLS 03/20/21   Orlie Madelin RAMAN, NP    Physical Exam: Vitals:   09/01/24 1034 09/01/24 1035 09/01/24 1148 09/01/24 1200  BP:   128/60 (!) 130/53  Pulse: 98  (!) 102 100  Resp: (!) 22  (!) 28 (!) 21  Temp:  TempSrc:      SpO2: 100% 100% 99% 99%  Weight:      Height:        Constitutional: NAD, calm, comfortable Vitals:   09/01/24 1034 09/01/24 1035 09/01/24 1148 09/01/24 1200  BP:   128/60 (!) 130/53  Pulse: 98  (!) 102 100  Resp: (!) 22  (!) 28 (!) 21  Temp:      TempSrc:      SpO2: 100% 100% 99% 99%  Weight:      Height:       Eyes: PERRL, lids and conjunctivae normal ENMT: Mucous membranes are moist. Posterior pharynx clear of any exudate or lesions.Normal dentition.  Neck: normal, supple, no masses, no thyromegaly Respiratory: Diminished breathing sound bilaterally, diffused wheezing, scattered crackles on bilateral lower fields, increasing breathing effort. No accessory muscle use.  Cardiovascular: Regular rate and rhythm, no murmurs / rubs / gallops. 1+ extremity edema. 2+ pedal pulses. No carotid bruits.  Abdomen: no tenderness, no masses palpated. No hepatosplenomegaly. Bowel sounds positive.  Musculoskeletal: no clubbing / cyanosis. No joint deformity upper and lower extremities. Good ROM, no contractures. Normal muscle tone.  Skin: no rashes, lesions, ulcers. No induration Neurologic: CN 2-12 grossly intact. Sensation  intact, DTR normal. Strength 5/5 in all 4.  Psychiatric: Normal judgment and insight. Alert and oriented x 3. Normal mood.    Labs on Admission: I have personally reviewed following labs and imaging studies  CBC: Recent Labs  Lab 09/01/24 1023  WBC 9.8  NEUTROABS 6.3  HGB 12.5  HCT 40.3  MCV 98.5  PLT 296   Basic Metabolic Panel: Recent Labs  Lab 09/01/24 1023  NA 140  K 3.8  CL 94*  CO2 34*  GLUCOSE 122*  BUN 7*  CREATININE 0.78  CALCIUM  9.1   GFR: Estimated Creatinine Clearance: 60.6 mL/min (by C-G formula based on SCr of 0.78 mg/dL). Liver Function Tests: Recent Labs  Lab 09/01/24 1023  AST 26  ALT 25  ALKPHOS 133*  BILITOT 0.6  PROT 8.0  ALBUMIN 3.9   No results for input(s): LIPASE, AMYLASE in the last 168 hours. No results for input(s): AMMONIA in the last 168 hours. Coagulation Profile: No results for input(s): INR, PROTIME in the last 168 hours. Cardiac Enzymes: No results for input(s): CKTOTAL, CKMB, CKMBINDEX, TROPONINI in the last 168 hours. BNP (last 3 results) No results for input(s): PROBNP in the last 8760 hours. HbA1C: No results for input(s): HGBA1C in the last 72 hours. CBG: No results for input(s): GLUCAP in the last 168 hours. Lipid Profile: No results for input(s): CHOL, HDL, LDLCALC, TRIG, CHOLHDL, LDLDIRECT in the last 72 hours. Thyroid Function Tests: No results for input(s): TSH, T4TOTAL, FREET4, T3FREE, THYROIDAB in the last 72 hours. Anemia Panel: No results for input(s): VITAMINB12, FOLATE, FERRITIN, TIBC, IRON, RETICCTPCT in the last 72 hours. Urine analysis:    Component Value Date/Time   COLORURINE STRAW (A) 12/03/2016 0101   APPEARANCEUR CLEAR (A) 12/03/2016 0101   APPEARANCEUR Clear 06/29/2013 2244   LABSPEC 1.006 12/03/2016 0101   LABSPEC 1.005 06/29/2013 2244   PHURINE 5.0 12/03/2016 0101   GLUCOSEU 150 (A) 12/03/2016 0101   GLUCOSEU Negative 06/29/2013  2244   HGBUR SMALL (A) 12/03/2016 0101   HGBUR trace-lysed 07/12/2009 1033   BILIRUBINUR NEGATIVE 12/03/2016 0101   BILIRUBINUR Negative 06/29/2013 2244   KETONESUR NEGATIVE 12/03/2016 0101   PROTEINUR NEGATIVE 12/03/2016 0101   UROBILINOGEN 0.2 08/08/2012 0107   NITRITE NEGATIVE 12/03/2016 0101  LEUKOCYTESUR NEGATIVE 12/03/2016 0101   LEUKOCYTESUR Trace 06/29/2013 2244    Radiological Exams on Admission: DG Chest Portable 1 View Result Date: 09/01/2024 EXAM: 1 VIEW(S) XRAY OF THE CHEST 09/01/2024 10:47:16 AM COMPARISON: 11/21/2023 CLINICAL HISTORY: sob, wheezing FINDINGS: LUNGS AND PLEURA: Emphysema. Biapical scarring medially. Accentuated interstitial markings at the left lung base are nonspecific but early bronchopneumonia is not excluded. No pulmonary edema. No pleural effusion. No pneumothorax. HEART AND MEDIASTINUM: Atheromatous vascular calcification of the aortic arch. No acute abnormality of the cardiac silhouette. BONES AND SOFT TISSUES: No acute osseous abnormality. IMPRESSION: 1. Accentuated interstitial markings at the left lung base, nonspecific, with early bronchopneumonia not excluded. 2. Emphysema and biapical scarring medially. 3. Atheromatous vascular calcification of the aortic arch. Electronically signed by: Ryan Salvage MD 09/01/2024 12:10 PM EST RP Workstation: [id number]    EKG: Independently reviewed.  Sinus rhythm, no acute ST changes.  Assessment/Plan Principal Problem:   COPD exacerbation (HCC) Active Problems:   COPD with acute exacerbation (HCC)  (please populate well all problems here in Problem List. (For example, if patient is on BP meds at home and you resume or decide to hold them, it is a problem that needs to be her. Same for CAD, COPD, HLD and so on)  Acute on chronic hypoxic respiratory failure Acute COPD exacerbation - Continue IV Solu-Medrol  - Continue BiPAP support - ICS/LABA, DuoNebs and as needed albuterol  - Incentive  spirometry -Switch home dose of prophylactic Bactrim  to doxycycline  - Other DDx, clinically also suspect patient may have decompensated diastolic CHF, she does have symptoms signs of mild fluid overload, 1 dose of IV Lasix  20 mg given and echocardiogram ordered.  Anxiety/depression - On multiple SSRI including BuSpar , Lexapro  and Remeron   Deconditioning - PT evaluation  DVT prophylaxis: Lovenox  Code Status: Full code Family Communication: Husband at bedside Disposition Plan: Patient sick with severe COPD exacerbation requiring BiPAP support and concurrent CHF conversation current IV Lasix , expect more than 2 midnight hospital stay Consults called: None Admission status: PCU admit   Cort ONEIDA Mana MD [facility] Pager [phone]  09/01/2024, 12:25 PM

## 2024-09-01 NOTE — Progress Notes (Signed)
 Due to patient's respiratory status, unable to turn patient to complete full skin assessment at this time

## 2024-09-01 NOTE — ED Triage Notes (Signed)
 Pt to ED via [facility] from home for c/o sob, flu-like symptoms, and cough for the past week. Pt wears 3L at baseline, was 87% on EMS arrival. Given 2 duo nebs and 125 solumedrol by EMS. Pt A&O

## 2024-09-01 NOTE — Plan of Care (Signed)
   Problem: Education: Goal: Knowledge of disease or condition will improve Outcome: Progressing Goal: Knowledge of the prescribed therapeutic regimen will improve Outcome: Progressing Goal: Individualized Educational Video(s) Outcome: Progressing   Problem: Activity: Goal: Ability to tolerate increased activity will improve Outcome: Progressing Goal: Will verbalize the importance of balancing activity with adequate rest periods Outcome: Progressing   Problem: Respiratory: Goal: Ability to maintain a clear airway will improve Outcome: Progressing Goal: Levels of oxygenation will improve Outcome: Progressing Goal: Ability to maintain adequate ventilation will improve Outcome: Progressing   Problem: Education: Goal: Knowledge of General Education information will improve Description: Including pain rating scale, medication(s)/side effects and non-pharmacologic comfort measures Outcome: Progressing   Problem: Health Behavior/Discharge Planning: Goal: Ability to manage health-related needs will improve Outcome: Progressing   Problem: Clinical Measurements: Goal: Ability to maintain clinical measurements within normal limits will improve Outcome: Progressing Goal: Will remain free from infection Outcome: Progressing Goal: Diagnostic test results will improve Outcome: Progressing Goal: Respiratory complications will improve Outcome: Progressing Goal: Cardiovascular complication will be avoided Outcome: Progressing   Problem: Activity: Goal: Risk for activity intolerance will decrease Outcome: Progressing   Problem: Nutrition: Goal: Adequate nutrition will be maintained Outcome: Progressing   Problem: Coping: Goal: Level of anxiety will decrease Outcome: Progressing   Problem: Elimination: Goal: Will not experience complications related to bowel motility Outcome: Progressing Goal: Will not experience complications related to urinary retention Outcome: Progressing    Problem: Pain Managment: Goal: General experience of comfort will improve and/or be controlled Outcome: Progressing   Problem: Safety: Goal: Ability to remain free from injury will improve Outcome: Progressing   Problem: Skin Integrity: Goal: Risk for impaired skin integrity will decrease Outcome: Progressing

## 2024-09-01 NOTE — ED Provider Notes (Addendum)
 [facility] Provider Note    Event Date/Time   First MD Initiated Contact with Patient 09/01/24 1008     (approximate)   History   Chief Complaint: Shortness of Breath   HPI  Heidi [name] is a 68 y.o. female with a history of GERD, COPD on 3 L nasal cannula at all times who comes ED complaining of worsening shortness of breath, body aches, fatigue, chills for the past week.  EMS note that on her usual 3 L nasal cannula her oxygen saturation was 87%.  They gave 2 DuoNebs and Solu-Medrol , increased supplemental O2 to 5 L which improved oxygenation to 92%.  Patient still feels very short of breath, denies chest pain.        Past Medical History:  Diagnosis Date   Anxiety    COPD (chronic obstructive pulmonary disease) (HCC)    GERD (gastroesophageal reflux disease)    Hypotension    limiting med titration   NSTEMI (non-ST elevated myocardial infarction) (HCC) 2013   12/2011 with normal coronaries possibly secondary to Takotsubo (EF 30-35% by echo), occurred following two episodes of acute respiratory distress   Pneumonia 11/2016   history of   Takotsubo syndrome 4/13    Current Outpatient Rx   Order #: [id number] Class: Historical Med   Order #: [id number] Class: Normal   Order #: [id number] Class: Historical Med   Order #: [id number] Class: Historical Med   Order #: [id number] Class: Historical Med   Order #: [id number] Class: Historical Med   Order #: [id number] Class: Historical Med   Order #: [id number] Class: Normal   Order #: [id number] Class: Normal   Order #: [id number] Class: Normal   Order #: [id number] Class: Normal   Order #: [id number] Class: Normal   Order #: [id number] Class: Historical Med   Order #: [id number] Class: Normal   Order #: [id number] Class: Historical Med   Order #: [id number] Class: Normal   Order #: [id number] Class: Historical Med   Order #: [id number] Class: Historical Med   Order #: [id number] Class: Normal   Order #: [id number] Class:  Historical Med   Order #: [id number] Class: Historical Med   Order #: [id number] Class: Normal    Past Surgical History:  Procedure Laterality Date   ABDOMINAL HYSTERECTOMY     CARDIAC CATHETERIZATION     CHOLECYSTECTOMY N/A 06/05/2017   Procedure: LAPAROSCOPIC CHOLECYSTECTOMY;  Surgeon: Claudene Larinda Bolder, MD;  Location: [facility] ORS;  Service: General;  Laterality: N/A;   FRACTURE SURGERY Left 07/2016   leg compound fraction   LEFT HEART CATHETERIZATION WITH CORONARY ANGIOGRAM N/A 01/23/2012   Procedure: LEFT HEART CATHETERIZATION WITH CORONARY ANGIOGRAM;  Surgeon: Toribio JONELLE Fuel, MD;  Location: [facility] CATH LAB;  Service: Cardiovascular;  Laterality: N/A;   vagina rectal repair     after childbirth    Physical Exam   Triage Vital Signs: ED Triage Vitals [09/01/24 1014]  Encounter Vitals Group     BP      Girls Systolic BP Percentile      Girls Diastolic BP Percentile      Boys Systolic BP Percentile      Boys Diastolic BP Percentile      Pulse      Resp      Temp      Temp src      SpO2      Weight      Height      Head Circumference      Peak Flow      Pain Score 0     Pain Loc  Pain Education      Exclude from Growth Chart     Most recent vital signs: Vitals:   09/01/24 1035 09/01/24 1148  BP:  128/60  Pulse:  (!) 102  Resp:  (!) 28  Temp:    SpO2: 100% 99%    General: Awake, moderate respiratory distress CV:  Good peripheral perfusion.  Regular rate and rhythm Resp:  Normal effort.  Poor air movement.  Markedly prolonged expiratory phase with expiratory wheezing. Abd:  No distention.  Soft, nontender Other:  No lower extremity edema or calf tenderness.  Symmetric calf circumference.   ED Results / Procedures / Treatments   Labs (all labs ordered are listed, but only abnormal results are displayed) Labs Reviewed  COMPREHENSIVE METABOLIC PANEL WITH GFR - Abnormal; Notable for the following components:      Result Value   Chloride 94 (*)    CO2 34 (*)     Glucose, Bld 122 (*)    BUN 7 (*)    Alkaline Phosphatase 133 (*)    All other components within normal limits  RESP PANEL BY RT-PCR (RSV, FLU A&B, COVID)  [id number]  RESPIRATORY PANEL BY PCR  CBC WITH DIFFERENTIAL/PLATELET     EKG Interpreted by me Sinus rhythm rate of 95.  Normal axis, normal intervals.  Normal QRS ST segments and T waves.  No ischemic changes.   RADIOLOGY Chest x-ray interpreted by me, no pneumothorax.  There is increased haziness over the left lower lung which was present on previous chest x-ray.  Radiology report reviewed   PROCEDURES:  .Critical Care  Performed by: Viviann Pastor, MD Authorized by: Viviann Pastor, MD   Critical care provider statement:    Critical care time (minutes):  35   Critical care time was exclusive of:  Separately billable procedures and treating other patients   Critical care was necessary to treat or prevent imminent or life-threatening deterioration of the following conditions:  Respiratory failure   Critical care was time spent personally by me on the following activities:  Development of treatment plan with patient or surrogate, discussions with consultants, evaluation of patient's response to treatment, examination of patient, obtaining history from patient or surrogate, ordering and performing treatments and interventions, ordering and review of laboratory studies, ordering and review of radiographic studies, pulse oximetry, re-evaluation of patient's condition and review of old charts   Care discussed with: admitting provider      MEDICATIONS ORDERED IN ED: Medications  doxycycline  (VIBRAMYCIN ) 100 mg in sodium chloride  0.9 % 250 mL IVPB (has no administration in time range)  guaiFENesin  (ROBITUSSIN) 100 MG/5ML liquid 5 mL (has no administration in time range)  Ensifentrine  SUSP 1 vial (has no administration in time range)  ALPRAZolam  (XANAX ) tablet 0.5 mg (has no administration in time range)  busPIRone  (BUSPAR )  tablet 10 mg (has no administration in time range)  escitalopram  (LEXAPRO ) tablet 20 mg (has no administration in time range)  mirtazapine  (REMERON ) tablet 15 mg (has no administration in time range)  albuterol  (VENTOLIN  HFA) 108 (90 Base) MCG/ACT inhaler 1-2 puff (has no administration in time range)  ipratropium-albuterol  (DUONEB) 0.5-2.5 (3) MG/3ML nebulizer solution 3 mL (has no administration in time range)  montelukast  (SINGULAIR ) tablet 10 mg (has no administration in time range)  budesonide -glycopyrrolate -formoterol  (BREZTRI) 160-9-4.8 MCG/ACT inhaler 2 puff (has no administration in time range)  pimecrolimus (ELIDEL) 1 % cream (has no administration in time range)  methylPREDNISolone  sodium succinate (SOLU-MEDROL ) 40 mg/mL injection 40 mg (has no  administration in time range)    Followed by  predniSONE  (DELTASONE ) tablet 40 mg (has no administration in time range)  enoxaparin  (LOVENOX ) injection 40 mg (has no administration in time range)  ondansetron  (ZOFRAN ) tablet 4 mg (has no administration in time range)    Or  ondansetron  (ZOFRAN ) injection 4 mg (has no administration in time range)  acetaminophen  (TYLENOL ) tablet 650 mg (has no administration in time range)    Or  acetaminophen  (TYLENOL ) suppository 650 mg (has no administration in time range)  doxycycline  (VIBRA -TABS) tablet 100 mg (has no administration in time range)  albuterol  (PROVENTIL ) (2.5 MG/3ML) 0.083% nebulizer solution 5 mg (5 mg Nebulization Given 09/01/24 1027)  magnesium  sulfate IVPB 2 g 50 mL (0 g Intravenous Stopped 09/01/24 1133)  LORazepam  (ATIVAN ) injection 1 mg (1 mg Intravenous Given 09/01/24 1144)     IMPRESSION / MDM / ASSESSMENT AND PLAN / ED COURSE  I reviewed the triage vital signs and the nursing notes.  DDx: COPD exacerbation, pneumonia, non-STEMI, COVID, influenza.  Doubt PE, pneumothorax, dissection, pericardial effusion  Patient's presentation is most consistent with acute presentation with  potential threat to life or bodily function.  Patient presents with respiratory distress, wheezing.  Still severely symptomatic after Solu-Medrol  and 2 DuoNeb's by EMS.  Will continue bronchodilators, magnesium  bolus, start BiPAP.  With acute on chronic hypoxic respiratory failure and respiratory distress, will need to admit for further management.  Not septic.  Clinical Course as of 09/01/24 1205  Mon Sep 01, 2024  1145 Work of breathing improved, still with prolonged expiratory phase and wheezing.  Will continue BiPAP and admit. [PS]  1205 Case discussed with hospitalist. [PS]    Clinical Course User Index [PS] Viviann Pastor, MD     FINAL CLINICAL IMPRESSION(S) / ED DIAGNOSES   Final diagnoses:  COPD exacerbation (HCC)  Acute on chronic respiratory failure with hypoxia (HCC)     Rx / DC Orders   ED Discharge Orders     None        Note:  This document was prepared using Dragon voice recognition software and may include unintentional dictation errors.   Viviann Pastor, MD 09/01/24 1147    Viviann Pastor, MD 09/01/24 1148    Viviann Pastor, MD 09/01/24 [phone]

## 2024-09-01 NOTE — ED Notes (Signed)
 This NT assisted pt onto bed pan. Urine discarded in toilet. Pt is now resting comfortably in bed.

## 2024-09-02 DIAGNOSIS — J441 Chronic obstructive pulmonary disease with (acute) exacerbation: Secondary | ICD-10-CM | POA: Diagnosis not present

## 2024-09-02 LAB — RESPIRATORY PANEL BY PCR

## 2024-09-02 LAB — BASIC METABOLIC PANEL WITH GFR
Anion gap: 10 (ref 5–15)
BUN: 16 mg/dL (ref 8–23)
CO2: 34 mmol/L — ABNORMAL HIGH (ref 22–32)
Calcium: 9 mg/dL (ref 8.9–10.3)
Chloride: 97 mmol/L — ABNORMAL LOW (ref 98–111)
Creatinine, Ser: 0.81 mg/dL (ref 0.44–1.00)
GFR, Estimated: >60 mL/min (ref >60)
Glucose, Bld: 172 mg/dL — ABNORMAL HIGH (ref 70–99)
Potassium: 4.6 mmol/L (ref 3.5–5.1)
Sodium: 141 mmol/L (ref 135–145)

## 2024-09-02 MED ORDER — SODIUM CHLORIDE 0.9 % IV SOLN
1.0000 g | Freq: Every day | INTRAVENOUS | Status: DC
  Administered 2024-09-02 – 2024-09-04 (×3): 1 g via INTRAVENOUS
  Filled 2024-09-02 (×3): qty 10

## 2024-09-02 MED ORDER — PREDNISONE 20 MG PO TABS: 40.0000 mg | ORAL_TABLET | Freq: Every day | ORAL | Status: DC

## 2024-09-02 MED ORDER — DOXYLAMINE SUCCINATE (SLEEP) 25 MG PO TABS
25.0000 mg | ORAL_TABLET | Freq: Every day | ORAL | Status: DC
  Administered 2024-09-02 – 2024-09-06 (×5): 25 mg via ORAL
  Filled 2024-09-02 (×5): qty 1

## 2024-09-02 MED ORDER — SODIUM CHLORIDE 0.9 % IV SOLN
500.0000 mg | Freq: Every day | INTRAVENOUS | Status: DC
  Administered 2024-09-02 – 2024-09-04 (×3): 500 mg via INTRAVENOUS
  Filled 2024-09-02 (×3): qty 5

## 2024-09-02 MED ORDER — PREDNISONE 20 MG PO TABS
40.0000 mg | ORAL_TABLET | Freq: Every day | ORAL | Status: DC
  Administered 2024-09-02: 40 mg via ORAL
  Filled 2024-09-02: qty 2

## 2024-09-02 MED ORDER — METHYLPREDNISOLONE SODIUM SUCC 125 MG IJ SOLR
60.0000 mg | Freq: Two times a day (BID) | INTRAMUSCULAR | Status: DC
  Administered 2024-09-02 – 2024-09-04 (×5): 60 mg via INTRAVENOUS
  Filled 2024-09-02 (×5): qty 2

## 2024-09-02 NOTE — Progress Notes (Addendum)
 PROGRESS NOTE   HPI was taken from Dr. Laurita: Heidi [name] is a 68 y.o. female with medical history significant of COPD Gold stage IV on chronic antibiotics and steroid therapy, chronic hypoxic respiratory failure on 4 L and as needed BiPAP, chronic HFpEF presented with worsening of cough wheezing shortness of breath.   Symptoms started 4 days ago, when patient started having a dry cough, wheezing and increasing shortness of breath, she has been using home BiPAP device hooked with home oxygen with some help however last night her breathing symptoms became worse and could not sleep decided to come to the ED.  She also noticed bilateral ankle swelling.  No chest pains.   ED Course: Pulmonary tachycardia tachypneic breathing rate 20/30, O2 saturation 99% on 3 L and then switched to BiPAP.  Chest x-ray negative for acute infiltrates, blood work showed WBC 9.8 hemoglobin 12.5 BUN 7 creatinine 0.7 bicarb 34.   Patient was given IV Solu-Medrol , DuoNebs in the ED   Heidi [name]  FMW:[id number] DOB: 1956/07/09 DOA: 09/01/2024 PCP: Marikay Eva POUR, PA    Assessment & Plan:   Principal Problem:   COPD exacerbation (HCC) Active Problems:   COPD with acute exacerbation (HCC)  Assessment and Plan: Acute on chronic hypoxic respiratory failure: found to be 87% on 3L [place] as per EMS. Continue on supplemental oxygen and wean back to baseline as tolerated. Echo ordered  Acute COPD exacerbation: severe. Continue on IV steroids, bronchodilators & encourage incentive spirometry. Viral resp panel positive for rhinovirus/enterovirus.  Possible CAP: as per CXR. Likely viral (rhinovirus/enterovirus) but possible superimposed bacterial infection. Started on IV rocephin , azithromycin . Continue on steroids, bronchodilators & encourage incentive spirometry. Will check pro-cal.   Depression: severity unknown. Continue on home dose of lexapro , buspar     Deconditioning: PT recs HH        DVT  prophylaxis: lovenox  Code Status: full  Family Communication: discussed pt's care w/ pt's family at bedside and answered their questions  Disposition Plan: likely d/c back home w/ HH   Level of care: Progressive  Status is: Inpatient Remains inpatient appropriate because: severity of illness    Consultants:    Procedures:   Antimicrobials: azithromycin , rocephin     Subjective: Pt c/o shortness of breath   Objective: Vitals:   09/02/24 0200 09/02/24 0347 09/02/24 0354 09/02/24 0809  BP:   135/70 132/70  Pulse:   87 (!) 101  Resp:   18 17  Temp:   (!) 97.3 F (36.3 C) (!) 97.5 F (36.4 C)  TempSrc:      SpO2: 95% 95% 98% 98%  Weight:      Height:        Intake/Output Summary (Last 24 hours) at 09/02/2024 0933 Last data filed at 09/02/2024 0810 Gross per 24 hour  Intake 487 ml  Output 1850 ml  Net -1363 ml   Filed Weights   09/01/24 1017  Weight: 68 kg    Examination:  General exam: Appears uncomfortable  Respiratory system: course breath sounds b/l  Cardiovascular system: S1 & S2+. No rubs, gallops or clicks. Gastrointestinal system: Abdomen is nondistended, soft and nontender. Hypoactive bowel sounds heard. Central nervous system: Alert and oriented. Moves all extremities Psychiatry: Judgement and insight appears at baseline. Flat mood and affect    Data Reviewed: I have personally reviewed following labs and imaging studies  CBC: Recent Labs  Lab 09/01/24 1023  WBC 9.8  NEUTROABS 6.3  HGB 12.5  HCT 40.3  MCV  98.5  PLT 296   Basic Metabolic Panel: Recent Labs  Lab 09/01/24 1023 09/02/24 0327  NA 140 141  K 3.8 4.6  CL 94* 97*  CO2 34* 34*  GLUCOSE 122* 172*  BUN 7* 16  CREATININE 0.78 0.81  CALCIUM  9.1 9.0   GFR: Estimated Creatinine Clearance: 59.8 mL/min (by C-G formula based on SCr of 0.81 mg/dL). Liver Function Tests: Recent Labs  Lab 09/01/24 1023  AST 26  ALT 25  ALKPHOS 133*  BILITOT 0.6  PROT 8.0  ALBUMIN 3.9    No results for input(s): LIPASE, AMYLASE in the last 168 hours. No results for input(s): AMMONIA in the last 168 hours. Coagulation Profile: No results for input(s): INR, PROTIME in the last 168 hours. Cardiac Enzymes: No results for input(s): CKTOTAL, CKMB, CKMBINDEX, TROPONINI in the last 168 hours. BNP (last 3 results) No results for input(s): PROBNP in the last 8760 hours. HbA1C: No results for input(s): HGBA1C in the last 72 hours. CBG: No results for input(s): GLUCAP in the last 168 hours. Lipid Profile: No results for input(s): CHOL, HDL, LDLCALC, TRIG, CHOLHDL, LDLDIRECT in the last 72 hours. Thyroid Function Tests: No results for input(s): TSH, T4TOTAL, FREET4, T3FREE, THYROIDAB in the last 72 hours. Anemia Panel: No results for input(s): VITAMINB12, FOLATE, FERRITIN, TIBC, IRON, RETICCTPCT in the last 72 hours. Sepsis Labs: No results for input(s): PROCALCITON, LATICACIDVEN in the last 168 hours.  Recent Results (from the past 240 hours)  Resp panel by RT-PCR (RSV, Flu A&B, Covid) Anterior Nasal Swab     Status: None   Collection Time: 09/01/24 10:23 AM   Specimen: Anterior Nasal Swab  Result Value Ref Range Status   SARS Coronavirus 2 by RT PCR NEGATIVE NEGATIVE Final    Comment: (NOTE) SARS-CoV-2 target nucleic acids are NOT DETECTED.  The SARS-CoV-2 RNA is generally detectable in upper respiratory specimens during the acute phase of infection. The lowest concentration of SARS-CoV-2 viral copies this assay can detect is 138 copies/mL. A negative result does not preclude SARS-Cov-2 infection and should not be used as the sole basis for treatment or other patient management decisions. A negative result may occur with  improper specimen collection/handling, submission of specimen other than nasopharyngeal swab, presence of viral mutation(s) within the areas targeted by this assay, and inadequate number of  viral copies(<138 copies/mL). A negative result must be combined with clinical observations, patient history, and epidemiological information. The expected result is Negative.  Fact Sheet for Patients:  bloggercourse.com  Fact Sheet for Healthcare Providers:  seriousbroker.it  This test is no t yet approved or cleared by the [place]  FDA and  has been authorized for detection and/or diagnosis of SARS-CoV-2 by FDA under an Emergency Use Authorization (EUA). This EUA will remain  in effect (meaning this test can be used) for the duration of the COVID-19 declaration under Section 564(b)(1) of the Act, 21 U.S.C.section 360bbb-3(b)(1), unless the authorization is terminated  or revoked sooner.       Influenza A by PCR NEGATIVE NEGATIVE Final   Influenza B by PCR NEGATIVE NEGATIVE Final    Comment: (NOTE) The Xpert Xpress SARS-CoV-2/FLU/RSV plus assay is intended as an aid in the diagnosis of influenza from Nasopharyngeal swab specimens and should not be used as a sole basis for treatment. Nasal washings and aspirates are unacceptable for Xpert Xpress SARS-CoV-2/FLU/RSV testing.  Fact Sheet for Patients: bloggercourse.com  Fact Sheet for Healthcare Providers: seriousbroker.it  This test is not yet approved or  cleared by the [place]  FDA and has been authorized for detection and/or diagnosis of SARS-CoV-2 by FDA under an Emergency Use Authorization (EUA). This EUA will remain in effect (meaning this test can be used) for the duration of the COVID-19 declaration under Section 564(b)(1) of the Act, 21 U.S.C. section 360bbb-3(b)(1), unless the authorization is terminated or revoked.     Resp Syncytial Virus by PCR NEGATIVE NEGATIVE Final    Comment: (NOTE) Fact Sheet for Patients: bloggercourse.com  Fact Sheet for Healthcare  Providers: seriousbroker.it  This test is not yet approved or cleared by the [place]  FDA and has been authorized for detection and/or diagnosis of SARS-CoV-2 by FDA under an Emergency Use Authorization (EUA). This EUA will remain in effect (meaning this test can be used) for the duration of the COVID-19 declaration under Section 564(b)(1) of the Act, 21 U.S.C. section 360bbb-3(b)(1), unless the authorization is terminated or revoked.  Performed at [facility], [address] [place], [place] [address]          Radiology Studies: DG Chest Portable 1 View Result Date: 09/01/2024 EXAM: 1 VIEW(S) XRAY OF THE CHEST 09/01/2024 10:47:16 AM COMPARISON: 11/21/2023 CLINICAL HISTORY: sob, wheezing FINDINGS: LUNGS AND PLEURA: Emphysema. Biapical scarring medially. Accentuated interstitial markings at the left lung base are nonspecific but early bronchopneumonia is not excluded. No pulmonary edema. No pleural effusion. No pneumothorax. HEART AND MEDIASTINUM: Atheromatous vascular calcification of the aortic arch. No acute abnormality of the cardiac silhouette. BONES AND SOFT TISSUES: No acute osseous abnormality. IMPRESSION: 1. Accentuated interstitial markings at the left lung base, nonspecific, with early bronchopneumonia not excluded. 2. Emphysema and biapical scarring medially. 3. Atheromatous vascular calcification of the aortic arch. Electronically signed by: Ryan Salvage MD 09/01/2024 12:10 PM EST RP Workstation: [id number]        Scheduled Meds:  ALPRAZolam   0.5 mg Oral BID   budesonide -glycopyrrolate -formoterol   2 puff Inhalation BID   busPIRone   10 mg Oral TID   doxycycline   100 mg Oral Q12H   enoxaparin  (LOVENOX ) injection  40 mg Subcutaneous Q24H   escitalopram   20 mg Oral Daily   ipratropium-albuterol   3 mL Nebulization Q4H   melatonin  2.5 mg Oral QHS   mirtazapine   15 mg Oral QHS   montelukast   10 mg Oral QHS   predniSONE   40  mg Oral Q breakfast   Continuous Infusions:   LOS: 1 day        Anthony CHRISTELLA Pouch, MD [facility] Pager [phone]  If 7PM-7AM, please contact night-coverage [email or web address]  09/02/2024, 9:33 AM

## 2024-09-02 NOTE — Evaluation (Signed)
 Physical Therapy Evaluation Patient Details Name: Heidi [name] MRN: [id number] DOB: 03-Jan-1956 Today's Date: 09/02/2024  History of Present Illness  Heidi [name] is a 68 y.o. female with medical history significant of COPD Gold stage IV on chronic antibiotics and steroid therapy, chronic hypoxic respiratory failure on 4 L and as needed BiPAP, chronic HFpEF presented with worsening of cough wheezing shortness of breath. Pt is on 3L of O2 at baseline.  Clinical Impression  Pt is pleasant 68 y.o. female admitted for COPD exacerbation. Pt is A*Ox4 and agreeable to PT evaluation. Prior to hospitalization pt reports IND with amb and ADLs without AD. Pt is on 3L of O2 at baseline. Pt demos bed mobility with supervision. Pt able to perform STS and amb 10ft with CGA. Pt intermittently holds onto external surfaces to maintain balance. Anticipate that pt would benefit from use of AD while amb. SpO2 down to 87% at the lowest while on 3L of O2. Pt requires cuing for PLB. Pt demonstrates deficits in activity tolerance/balance. Would benefit from skilled PT to address above deficits and promote optimal return to PLOF.       If plan is discharge home, recommend the following: A little help with walking and/or transfers;A little help with bathing/dressing/bathroom;Help with stairs or ramp for entrance   Can travel by private vehicle        Equipment Recommendations None recommended by PT  Recommendations for Other Services       Functional Status Assessment Patient has had a recent decline in their functional status and demonstrates the ability to make significant improvements in function in a reasonable and predictable amount of time.     Precautions / Restrictions Precautions Precautions: Fall Recall of Precautions/Restrictions: Intact Restrictions Weight Bearing Restrictions Per Provider Order: No      Mobility  Bed Mobility Overal bed mobility: Needs Assistance Bed Mobility: Supine to  Sit     Supine to sit: Supervision     General bed mobility comments: increased effort for supine>sit, however no physical assistance needed. cuing for PLB. SOB throughout activity.    Transfers Overall transfer level: Needs assistance Equipment used: None Transfers: Sit to/from Stand Sit to Stand: Contact guard assist           General transfer comment: STS from lowest bed height with CGA for safety.    Ambulation/Gait Ambulation/Gait assistance: Contact guard assist Gait Distance (Feet): 40 Feet Assistive device: None Gait Pattern/deviations: Step-through pattern, Decreased stride length Gait velocity: dec     General Gait Details: Pt intermittently holding onto external surfaces to maintain balance/for confidence.  No LOB observed. CGA for safety.  Stairs            Wheelchair Mobility     Tilt Bed    Modified Rankin (Stroke Patients Only)       Balance Overall balance assessment: Needs assistance Sitting-balance support: No upper extremity supported, Feet supported Sitting balance-Leahy Scale: Good Sitting balance - Comments: Able to maintain sitting balance at EOB, however fatigues quickly requiring support from elevated HOB. Pt demos good trunk control when attempting to don socks and ultimately requires assistance d/t dec hip ROM.   Standing balance support: During functional activity, No upper extremity supported Standing balance-Leahy Scale: Fair Standing balance comment: No dizziness upon standing. CGA for safety.                             Pertinent Vitals/Pain Pain Assessment  Pain Assessment: No/denies pain    Home Living Family/patient expects to be discharged to:: Private residence Living Arrangements: Spouse/significant other Available Help at Discharge: Family;Available 24 hours/day Type of Home: Mobile home Home Access: Stairs to enter Entrance Stairs-Rails: Right;Left;Can reach both Entrance Stairs-Number of Steps:  4   Home Layout: One level Home Equipment: Agricultural Consultant (2 wheels);Cane - single point;Wheelchair - manual;Shower seat      Prior Function Prior Level of Function : Independent/Modified Independent;Driving             Mobility Comments: IND without AD ADLs Comments: IND. Pt's spouse performs IADLs     Extremity/Trunk Assessment   Upper Extremity Assessment Upper Extremity Assessment: Overall WFL for tasks assessed    Lower Extremity Assessment Lower Extremity Assessment: Overall WFL for tasks assessed    Cervical / Trunk Assessment Cervical / Trunk Assessment: Kyphotic  Communication   Communication Communication: No apparent difficulties    Cognition Arousal: Alert Behavior During Therapy: WFL for tasks assessed/performed   PT - Cognitive impairments: No apparent impairments                       PT - Cognition Comments: Pt is A&Ox4. Pleasant and agreeable to PT Following commands: Intact       Cueing Cueing Techniques: Verbal cues     General Comments General comments (skin integrity, edema, etc.): SpO2 monitored throughout session. Pt on 3L of O2, which is her baseline. SpO2 drop to 87% at the lowest. HR 120bpm max.    Exercises Other Exercises Other Exercises: Education: on role of PT in acute setting, using pulse ox at home.   Assessment/Plan    PT Assessment Patient needs continued PT services  PT Problem List Decreased strength;Decreased range of motion;Decreased activity tolerance;Decreased balance;Decreased mobility;Decreased knowledge of use of DME;Cardiopulmonary status limiting activity       PT Treatment Interventions DME instruction;Gait training;Stair training;Functional mobility training;Therapeutic activities;Therapeutic exercise;Balance training;Neuromuscular re-education;Patient/family education    PT Goals (Current goals can be found in the Care Plan section)  Acute Rehab PT Goals Patient Stated Goal: to breathe  better PT Goal Formulation: With patient Time For Goal Achievement: 09/16/24 Potential to Achieve Goals: Good    Frequency Min 2X/week     Co-evaluation               AM-PAC PT 6 Clicks Mobility  Outcome Measure Help needed turning from your back to your side while in a flat bed without using bedrails?: None Help needed moving from lying on your back to sitting on the side of a flat bed without using bedrails?: A Little Help needed moving to and from a bed to a chair (including a wheelchair)?: A Little Help needed standing up from a chair using your arms (e.g., wheelchair or bedside chair)?: A Little Help needed to walk in hospital room?: A Little Help needed climbing 3-5 steps with a railing? : A Little 6 Click Score: 19    End of Session Equipment Utilized During Treatment: Gait belt;Oxygen Activity Tolerance: Patient tolerated treatment well Patient left: in chair;with call bell/phone within reach Nurse Communication: Mobility status PT Visit Diagnosis: Unsteadiness on feet (R26.81)    Time: 9146-9084 PT Time Calculation (min) (ACUTE ONLY): 22 min   Charges:                 Eirik Schueler, SPT   [place] 09/02/2024, 9:39 AM

## 2024-09-02 NOTE — Plan of Care (Signed)
   Problem: Education: Goal: Knowledge of disease or condition will improve Outcome: Progressing Goal: Knowledge of the prescribed therapeutic regimen will improve Outcome: Progressing Goal: Individualized Educational Video(s) Outcome: Progressing   Problem: Activity: Goal: Ability to tolerate increased activity will improve Outcome: Progressing Goal: Will verbalize the importance of balancing activity with adequate rest periods Outcome: Progressing   Problem: Respiratory: Goal: Ability to maintain a clear airway will improve Outcome: Progressing Goal: Levels of oxygenation will improve Outcome: Progressing Goal: Ability to maintain adequate ventilation will improve Outcome: Progressing   Problem: Education: Goal: Knowledge of General Education information will improve Description: Including pain rating scale, medication(s)/side effects and non-pharmacologic comfort measures Outcome: Progressing   Problem: Health Behavior/Discharge Planning: Goal: Ability to manage health-related needs will improve Outcome: Progressing   Problem: Clinical Measurements: Goal: Ability to maintain clinical measurements within normal limits will improve Outcome: Progressing Goal: Will remain free from infection Outcome: Progressing Goal: Diagnostic test results will improve Outcome: Progressing Goal: Respiratory complications will improve Outcome: Progressing Goal: Cardiovascular complication will be avoided Outcome: Progressing   Problem: Activity: Goal: Risk for activity intolerance will decrease Outcome: Progressing   Problem: Nutrition: Goal: Adequate nutrition will be maintained Outcome: Progressing   Problem: Coping: Goal: Level of anxiety will decrease Outcome: Progressing   Problem: Elimination: Goal: Will not experience complications related to bowel motility Outcome: Progressing Goal: Will not experience complications related to urinary retention Outcome: Progressing    Problem: Pain Managment: Goal: General experience of comfort will improve and/or be controlled Outcome: Progressing   Problem: Safety: Goal: Ability to remain free from injury will improve Outcome: Progressing   Problem: Skin Integrity: Goal: Risk for impaired skin integrity will decrease Outcome: Progressing

## 2024-09-03 ENCOUNTER — Inpatient Hospital Stay
Admit: 2024-09-03 | Discharge: 2024-09-03 | Disposition: A | Discharge status: Home / Self Care | Attending: Internal Medicine | Admitting: Internal Medicine

## 2024-09-03 DIAGNOSIS — J441 Chronic obstructive pulmonary disease with (acute) exacerbation: Secondary | ICD-10-CM | POA: Diagnosis not present

## 2024-09-03 LAB — CBC
HCT: 39.1 % (ref 36.0–46.0)
Hemoglobin: 11.9 g/dL — ABNORMAL LOW (ref 12.0–15.0)
MCH: 30.7 pg (ref 26.0–34.0)
MCHC: 30.4 g/dL (ref 30.0–36.0)
MCV: 101 fL — ABNORMAL HIGH (ref 80.0–100.0)
Platelets: 263 K/uL (ref 150–400)
RBC: 3.87 MIL/uL (ref 3.87–5.11)
RDW: 13.5 % (ref 11.5–15.5)
WBC: 13.5 K/uL — ABNORMAL HIGH (ref 4.0–10.5)
nRBC: 0 % (ref 0.0–0.2)

## 2024-09-03 LAB — ECHOCARDIOGRAM COMPLETE
Height: 65 in
S' Lateral: 2.9 cm
Weight: 2400 [oz_av]

## 2024-09-03 LAB — BASIC METABOLIC PANEL WITH GFR
Anion gap: 11 (ref 5–15)
BUN: 18 mg/dL (ref 8–23)
CO2: 34 mmol/L — ABNORMAL HIGH (ref 22–32)
Calcium: 9.3 mg/dL (ref 8.9–10.3)
Chloride: 97 mmol/L — ABNORMAL LOW (ref 98–111)
Creatinine, Ser: 0.81 mg/dL (ref 0.44–1.00)
GFR, Estimated: >60 mL/min (ref >60)
Glucose, Bld: 154 mg/dL — ABNORMAL HIGH (ref 70–99)
Potassium: 4.6 mmol/L (ref 3.5–5.1)
Sodium: 142 mmol/L (ref 135–145)

## 2024-09-03 LAB — PROCALCITONIN: Procalcitonin: <0.1 ng/mL

## 2024-09-03 MED ORDER — SALINE SPRAY 0.65 % NA SOLN
1.0000 | NASAL | Status: DC | PRN
  Administered 2024-09-03: 1 via NASAL
  Filled 2024-09-03: qty 44

## 2024-09-03 MED ORDER — SODIUM CHLORIDE 3 % IN NEBU
4.0000 mL | INHALATION_SOLUTION | Freq: Two times a day (BID) | RESPIRATORY_TRACT | Status: AC
  Administered 2024-09-03 – 2024-09-06 (×5): 4 mL via RESPIRATORY_TRACT
  Filled 2024-09-03 (×7): qty 4

## 2024-09-03 MED ORDER — IPRATROPIUM-ALBUTEROL 0.5-2.5 (3) MG/3ML IN SOLN
3.0000 mL | Freq: Four times a day (QID) | RESPIRATORY_TRACT | Status: DC
  Administered 2024-09-03 – 2024-09-05 (×6): 3 mL via RESPIRATORY_TRACT
  Filled 2024-09-03 (×6): qty 3

## 2024-09-03 NOTE — TOC Progression Note (Signed)
 Transition of Care [facility]) - Progression Note    Patient Details  Name: Heidi [name] MRN: [id number] Date of Birth: 06/01/1956  [facility] CM/SW Contact  Marinda Cooks, RN Phone Number: 09/03/2024, 10:30 AM  Clinical Narrative:    This CM spoke with pt in detail regarding dc planning and PT recommended HH pt agreed. Pt provided agency choice and informed she did not have a preference. [facility] confirmed referral and provided a SOC.  Pt informed she has open services with [facility] who supplies her Bi-Pap , this CM confirmed with Mitch pt still has open services . Pt shared her partner Heidi [name] will provide at her dc transportation at dc . TOC will cont to follow dc planning/ care coordination and update as applicable.        Expected Discharge Plan and Services TBD    Social Drivers of Health (SDOH) Interventions SDOH Screenings   Food Insecurity: No Food Insecurity (01/10/2024)   Received from [facility]  Housing: Low Risk  (01/10/2024)   Received from [facility]  Transportation Needs: No Transportation Needs (01/10/2024)   Received from [facility]  Utilities: Not At Risk (01/10/2024)   Received from [facility]  Depression [phone]): Low Risk  (04/11/2023)  Financial Resource Strain: Low Risk  (01/10/2024)   Received from [facility]  Physical Activity: Inactive (10/16/2018)  Social Connections: Moderately Integrated (11/23/2023)  Stress: Stress Concern Present (10/16/2018)  Tobacco Use: Medium Risk (09/01/2024)    Readmission Risk Interventions     No data to display

## 2024-09-03 NOTE — Progress Notes (Signed)
  PROGRESS NOTE    Heidi [name]  FMW:[id number] DOB: July 12, 1956 DOA: 09/01/2024 PCP: Marikay Eva POUR, PA  [id number]  LOS: 2 days   Brief hospital course:   Assessment & Plan: Heidi [name] is a 68 y.o. female with medical history significant of COPD Gold stage IV on chronic antibiotics and steroid therapy, chronic hypoxic respiratory failure on 4 L and as needed BiPAP, chronic HFpEF presented with worsening of cough wheezing shortness of breath.    Acute on chronic hypoxic respiratory failure:  On 4L O2 at baseline found to be 87% on 3L [place] as per EMS. --Continue supplemental O2 to keep sats >=90%  Acute COPD exacerbation 2/2 Rhinoviral infection --on chronic prednisone  5 mg daily PTA --cont IV solumedrol --cont bronchodilators --cont empiric tx for bacterial PNA --start hypertonic saline neb BID   Depression: severity unknown.  Continue on home dose of lexapro , buspar    Anxiety --cont home Xanax    Deconditioning: PT recs HH    DVT prophylaxis: Lovenox  SQ Code Status: Full code  Family Communication: significant other updated at bedside today Level of care: Progressive Dispo:   The patient is from: home Anticipated d/c is to: home Anticipated d/c date is: 2-3 days   Subjective and Interval History:  Pt reported having coughing fits whenever taking mildly deeper breaths.   Objective: Vitals:   09/03/24 1203 09/03/24 1522 09/03/24 2005 09/03/24 2029  BP: (!) 158/75 (!) 165/81  (!) 154/85  Pulse: (!) 104 (!) 108  (!) 108  Resp: 19 17  19   Temp: 97.6 F (36.4 C) 97.7 F (36.5 C)  98.1 F (36.7 C)  TempSrc:    Oral  SpO2: 100% 95% 95% 96%  Weight:      Height:        Intake/Output Summary (Last 24 hours) at 09/03/2024 2117 Last data filed at 09/03/2024 1912 Gross per 24 hour  Intake 750 ml  Output 1200 ml  Net -450 ml   Filed Weights   09/01/24 1017  Weight: 68 kg    Examination:   Constitutional: NAD, AAOx3 HEENT: conjunctivae and  lids normal, EOMI CV: No cyanosis.   RESP: normal respiratory effort at rest, on 3L Neuro: II - XII grossly intact.   Psych: Normal mood and affect.  Appropriate judgement and reason   Data Reviewed: I have personally reviewed labs and imaging studies  Time spent: 50 minutes  Ellouise Haber, MD [facility] If 7PM-7AM, please contact night-coverage 09/03/2024, 9:17 PM

## 2024-09-03 NOTE — Evaluation (Signed)
 Occupational Therapy Evaluation Patient Details Name: Heidi [name] MRN: [id number] DOB: 30-Apr-1956 Today's Date: 09/03/2024   History of Present Illness   Heidi [name] is a 68 y.o. female with medical history significant of COPD Gold stage IV on chronic antibiotics and steroid therapy, chronic hypoxic respiratory failure on 4 L and as needed BiPAP, chronic HFpEF presented with worsening of cough wheezing shortness of breath. Pt is on 3L of O2 at baseline.     Clinical Impressions Patient was seen for OT evaluation this date, saw first in AM and then ECHO present and patient requested for OT to return to complete eval; two times documented to capture disruption of eval. Prior to hospital admission, patient was managing ADLs without A from spouse, spouse manages all IADLs, patient does not use AD for short distance ambulation however in community opts for electric scooters when available. Patient lives in mobile home with 4 steps to enter with spouse who is able to provide assist and  support. Patient has been hospitalized due to COPD exacerbation. Patient on 3L of O2 via [place] throughout eval, agreeable to eval/tx. She performed bed mobility and transfers with SBA/increased time and effort but no physical A needed. She was able to ambulate from bed to bathroom while managing IV pole with SBA, she was able to void in commode and manage clothing/hygiene with SBA. She ambulated back to her room and transferred to recliner demonstrating good safety awareness with managing IV pole while turning to sit. Patient would be appropriate for [facility] OT once discharged, OT discussed this with patient and patient agreable.  Paient would benefit from skilled OT services to address noted impairments and functional limitations (see below for any additional details) in order to maximize safety and independence while minimizing future risk of falls, injury, and readmission.  Anticipate the need for follow up OT services upon  acute hospital DC.      If plan is discharge home, recommend the following:   A little help with walking and/or transfers;A little help with bathing/dressing/bathroom;Help with stairs or ramp for entrance     Functional Status Assessment         Equipment Recommendations   None recommended by OT     Recommendations for Other Services         Precautions/Restrictions   Precautions Precautions: Fall Recall of Precautions/Restrictions: Intact Restrictions Weight Bearing Restrictions Per Provider Order: No     Mobility Bed Mobility Overal bed mobility: Needs Assistance Bed Mobility: Supine to Sit     Supine to sit: Supervision     General bed mobility comments: increased effort but no physical A required    Transfers Overall transfer level: Needs assistance Equipment used: None Transfers: Sit to/from Stand Sit to Stand: Supervision                  Balance Overall balance assessment: Needs assistance Sitting-balance support: No upper extremity supported, Feet supported Sitting balance-Leahy Scale: Good Sitting balance - Comments: Able to maintain sitting balance at EOB, however fatigues quickly requiring support from elevated HOB. Pt demos good trunk control when attempting to don socks and ultimately requires assistance d/t dec hip ROM.   Standing balance support: During functional activity, No upper extremity supported Standing balance-Leahy Scale: Fair Standing balance comment: SBA in stance                           ADL either performed or assessed with  clinical judgement   ADL Overall ADL's : Needs assistance/impaired Eating/Feeding: Independent   Grooming: Wash/dry hands;Modified independent;Standing                   Toilet Transfer: Supervision/safety;Grab bars   Toileting- Clothing Manipulation and Hygiene: Supervision/safety;Sit to/from stand         General ADL Comments: performed all tasks with  increased time and effort     Vision         Perception         Praxis         Pertinent Vitals/Pain Pain Assessment Pain Assessment: 0-10 Pain Score: 3  Pain Location: abdomen (from coughing) Pain Descriptors / Indicators: Aching Pain Intervention(s): Monitored during session     Extremity/Trunk Assessment Upper Extremity Assessment Upper Extremity Assessment: Overall WFL for tasks assessed   Lower Extremity Assessment Lower Extremity Assessment: Defer to PT evaluation       Communication Communication Communication: No apparent difficulties   Cognition Arousal: Alert Behavior During Therapy: WFL for tasks assessed/performed Cognition: No apparent impairments                               Following commands: Intact       Cueing  General Comments   Cueing Techniques: Verbal cues  3L of O2   Exercises     Shoulder Instructions      Home Living Family/patient expects to be discharged to:: Private residence Living Arrangements: Spouse/significant other Available Help at Discharge: Family;Available 24 hours/day Type of Home: Mobile home Home Access: Stairs to enter Entrance Stairs-Number of Steps: 4 Entrance Stairs-Rails: Right;Left;Can reach both Home Layout: One level     Bathroom Shower/Tub: Chief Strategy Officer: Standard Bathroom Accessibility: Yes How Accessible: Accessible via walker Home Equipment: Rollator (4 wheels);Cane - single point;Shower seat;BSC/3in1          Prior Functioning/Environment Prior Level of Function : Independent/Modified Independent;Driving             Mobility Comments: ambulates short distances without AD, uses electric carts when going to stores ADLs Comments: patient performs all ADLs without A; spouse present to assist if needed, spouse manages all IADLS    OT Problem List:     OT Treatment/Interventions:        OT Goals(Current goals can be found in the care plan  section)   Acute Rehab OT Goals Patient Stated Goal: to go home OT Goal Formulation: With patient Time For Goal Achievement: 09/17/24 Potential to Achieve Goals: Good ADL Goals Pt Will Perform Grooming: with modified independence;sitting;standing Pt Will Perform Lower Body Dressing: with modified independence;sit to/from stand Pt Will Transfer to Toilet: with modified independence;grab bars;ambulating Pt Will Perform Toileting - Clothing Manipulation and hygiene: with modified independence;sit to/from stand   OT Frequency:  Min 2X/week    Co-evaluation              AM-PAC OT 6 Clicks Daily Activity     Outcome Measure Help from another person eating meals?: None Help from another person taking care of personal grooming?: None Help from another person toileting, which includes using toliet, bedpan, or urinal?: A Little Help from another person bathing (including washing, rinsing, drying)?: A Little Help from another person to put on and taking off regular upper body clothing?: A Little Help from another person to put on and taking off regular lower body clothing?: A Little 6  Click Score: 20   End of Session Equipment Utilized During Treatment: Oxygen  Activity Tolerance: Patient tolerated treatment well Patient left: in chair;with call bell/phone within reach;with chair alarm set  OT Visit Diagnosis: Unsteadiness on feet (R26.81);Other abnormalities of gait and mobility (R26.89)                Time: 748-8:03 (interview/history); [phone] (eval/functional mobility) OT Time Calculation (min): 18 min Charges:  OT General Charges $OT Visit: 1 Visit OT Evaluation $OT Eval Low Complexity: 1 Low OT Treatments $Self Care/Home Management : 8-22 mins  Rogers Clause, OT/L MSOT, 09/03/2024

## 2024-09-03 NOTE — Progress Notes (Signed)
 Physical Therapy Treatment Patient Details Name: Heidi [name] MRN: [id number] DOB: Dec 31, 1955 Today's Date: 09/03/2024   History of Present Illness Heidi [name] is a 68 y.o. female with medical history significant of COPD Gold stage IV on chronic antibiotics and steroid therapy, chronic hypoxic respiratory failure on 4 L and as needed BiPAP, chronic HFpEF presented with worsening of cough wheezing shortness of breath. Pt is on 3L of O2 at baseline.    PT Comments  Pt received in chair and was agreeable to treatment. SPT educated pt on safe hand placement with RW transfers, and reviewed how to operate rollator breaks. Pt required additional cuing to place her hands on the rollator breaks in a way that would produce enough strength to lock them. Pt was agreeable to attempting gait with RW and was able to ambulate 17ft with CGA and O2 and HR monitored throughout. Pt O2 dropped to 87% on 3L with exertion but recovered quickly with standing rest break and PLB. Pt would benefit from continued skilled therapy services to address her decreased endurance and reinforce AD education.   If plan is discharge home, recommend the following: A little help with walking and/or transfers;A little help with bathing/dressing/bathroom;Help with stairs or ramp for entrance   Can travel by private vehicle        Equipment Recommendations  None recommended by PT    Recommendations for Other Services       Precautions / Restrictions Precautions Precautions: Fall Recall of Precautions/Restrictions: Intact Restrictions Weight Bearing Restrictions Per Provider Order: No     Mobility  Bed Mobility               General bed mobility comments: NT - pt received in chair    Transfers Overall transfer level: Needs assistance Equipment used: Rolling walker (2 wheels), Rollator (4 wheels) Transfers: Sit to/from Stand Sit to Stand: Supervision           General transfer comment: Pt transfers  from chair to RW with supervision; required edu on safe hand placement on RW and reminders to lock the breaks on rollator prior to sitting and standing.    Ambulation/Gait Ambulation/Gait assistance: Contact guard assist Gait Distance (Feet): 80 Feet Assistive device: Rolling walker (2 wheels) Gait Pattern/deviations: Narrow base of support, Trunk flexed, Decreased stride length       General Gait Details: Pt ambulated with RW for 79ft with CGA, O2 and HR monitored frequently. O2 dropped to 87% on 3L with exertion but recovered quickly and did not exhibit severe SOB, no LOB. Rollator follow.   Stairs             Wheelchair Mobility     Tilt Bed    Modified Rankin (Stroke Patients Only)       Balance Overall balance assessment: Needs assistance Sitting-balance support: No upper extremity supported, Feet supported Sitting balance-Leahy Scale: Good Sitting balance - Comments: Able to maintain trunk control in chair without UE support   Standing balance support: During functional activity, Bilateral upper extremity supported Standing balance-Leahy Scale: Fair Standing balance comment: SBA in stance with bilateral UE support on RW                            Communication Communication Communication: No apparent difficulties  Cognition Arousal: Alert Behavior During Therapy: WFL for tasks assessed/performed   PT - Cognitive impairments: No apparent impairments  PT - Cognition Comments: Pt is A&Ox4. Pleasant and agreeable to PT Following commands: Intact      Cueing Cueing Techniques: Verbal cues  Exercises Other Exercises Other Exercises: edu on safe hand placement for RW transfers and locking rollator; reinforced edu on PLB    General Comments General comments (skin integrity, edema, etc.): 3L O2      Pertinent Vitals/Pain Pain Assessment Pain Assessment: No/denies pain    Home Living                           Prior Function            PT Goals (current goals can now be found in the care plan section) Acute Rehab PT Goals Patient Stated Goal: to breathe better PT Goal Formulation: With patient Time For Goal Achievement: 09/16/24 Potential to Achieve Goals: Good Progress towards PT goals: Progressing toward goals    Frequency    Min 2X/week      PT Plan      Co-evaluation              AM-PAC PT 6 Clicks Mobility   Outcome Measure  Help needed turning from your back to your side while in a flat bed without using bedrails?: None Help needed moving from lying on your back to sitting on the side of a flat bed without using bedrails?: A Little Help needed moving to and from a bed to a chair (including a wheelchair)?: A Little Help needed standing up from a chair using your arms (e.g., wheelchair or bedside chair)?: A Little Help needed to walk in hospital room?: A Little Help needed climbing 3-5 steps with a railing? : A Little 6 Click Score: 19    End of Session Equipment Utilized During Treatment: Gait belt;Oxygen Activity Tolerance: Patient tolerated treatment well Patient left: in chair;with call bell/phone within reach;with chair alarm set Nurse Communication: Mobility status PT Visit Diagnosis: Other abnormalities of gait and mobility (R26.89);Difficulty in walking, not elsewhere classified (R26.2)     Time: 9065-9040 PT Time Calculation (min) (ACUTE ONLY): 25 min  Charges:    $Gait Training: 23-37 mins PT General Charges $$ ACUTE PT VISIT: 1 Visit                     Allena Bulls, SPT    Allena Bulls 09/03/2024, 2:20 PM

## 2024-09-03 NOTE — Care Management Important Message (Signed)
 Important Message  Patient Details  Name: PELAGIA IACOBUCCI MRN: [id number] Date of Birth: 11/24/1955   Important Message Given:  Yes - Medicare IM     Rojelio SHAUNNA Rattler 09/03/2024, 1:14 PM

## 2024-09-03 NOTE — Plan of Care (Signed)
  Problem: Education: Goal: Knowledge of disease or condition will improve Outcome: Progressing   Problem: Activity: Goal: Ability to tolerate increased activity will improve Outcome: Progressing   Problem: Respiratory: Goal: Ability to maintain a clear airway will improve Outcome: Progressing Goal: Levels of oxygenation will improve Outcome: Progressing Goal: Ability to maintain adequate ventilation will improve Outcome: Progressing   Problem: Clinical Measurements: Goal: Respiratory complications will improve Outcome: Progressing   Problem: Coping: Goal: Level of anxiety will decrease Outcome: Progressing   Problem: Safety: Goal: Ability to remain free from injury will improve Outcome: Progressing

## 2024-09-03 NOTE — Progress Notes (Signed)
*  PRELIMINARY RESULTS* Echocardiogram 2D Echocardiogram has been performed.  Heidi [name] 09/03/2024, 8:41 AM

## 2024-09-04 DIAGNOSIS — J441 Chronic obstructive pulmonary disease with (acute) exacerbation: Secondary | ICD-10-CM | POA: Diagnosis not present

## 2024-09-04 MED ORDER — PREDNISONE 20 MG PO TABS
40.0000 mg | ORAL_TABLET | Freq: Every day | ORAL | Status: DC
  Administered 2024-09-05 – 2024-09-07 (×3): 40 mg via ORAL
  Filled 2024-09-04 (×3): qty 2

## 2024-09-04 MED ORDER — AZITHROMYCIN 500 MG PO TABS
500.0000 mg | ORAL_TABLET | Freq: Every day | ORAL | Status: DC
  Administered 2024-09-05: 500 mg via ORAL
  Filled 2024-09-04: qty 1

## 2024-09-04 NOTE — Plan of Care (Signed)
  Problem: Education: Goal: Knowledge of disease or condition will improve Outcome: Progressing   Problem: Clinical Measurements: Goal: Cardiovascular complication will be avoided Outcome: Progressing   Problem: Activity: Goal: Risk for activity intolerance will decrease Outcome: Progressing   Problem: Pain Managment: Goal: General experience of comfort will improve and/or be controlled Outcome: Progressing   Problem: Safety: Goal: Ability to remain free from injury will improve Outcome: Progressing

## 2024-09-04 NOTE — Progress Notes (Signed)
 Mobility Specialist - Progress Note  Pre-mobility: HR-103, SpO2-89%  During mobility: HR-113, SpO2-98%  Post-mobility: HR-111, SPO2-95%   09/04/24 1400  Mobility  Activity Ambulated with assistance;Respositioned in chair  Level of Assistance Contact guard assist, steadying assist  Assistive Device  (IV Stand with O2 Tank)  Distance Ambulated (ft) 50 ft  Range of Motion/Exercises All extremities  Activity Response Tolerated well  Mobility visit 1 Mobility  Mobility Specialist Start Time (ACUTE ONLY) 1225  Mobility Specialist Stop Time (ACUTE ONLY) 1254  Mobility Specialist Time Calculation (min) (ACUTE ONLY) 29 min   Pt was in recliner on O2 @3  L and receiving IV fluids. Pt agreed to mobility. Pt had a BM before mobility. Pt O2 vitals were taken throughout activity as a precaution. Pt is able to STS independently with no AD. Desat occurred only at this time during activity. Pt recovered in less than one min Pt ambulated well. Pt did need recovery breaks throughout activity to check vitals. After activity pt returned to the room repositioned in the recliner with needs in reach.  Clem Rodes Mobility Specialist 09/04/24, 2:44 PM

## 2024-09-04 NOTE — Progress Notes (Signed)
  PROGRESS NOTE    Heidi [name]  FMW:[id number] DOB: [birth year] DOA: 09/01/2024 PCP: Marikay Eva POUR, PA  [id number]  LOS: 3 days   Brief hospital course:   Assessment & Plan: Heidi [name] is a 68 y.o. female with medical history significant of COPD Gold stage IV on chronic antibiotics and steroid therapy, chronic hypoxic respiratory failure on 4 L and as needed BiPAP, chronic HFpEF presented with worsening of cough wheezing shortness of breath.    Acute on chronic hypoxic respiratory failure:  On 4L O2 at baseline found to be 87% on 3L [place] as per EMS.  Now on 3L. --Continue supplemental O2 to keep sats >=90%  Acute COPD exacerbation 2/2 Rhinoviral infection --on chronic prednisone  5 mg daily PTA --s/p IV solumedrol --transition to prednisone  40 mg daily tomorrow --cont bronchodilators --cont empiric tx for bacterial PNA  --cont hypertonic saline neb BID   Depression: severity unknown.  Continue on home dose of lexapro , buspar    Anxiety --cont home Xanax    Deconditioning: PT recs HH    DVT prophylaxis: Lovenox  SQ Code Status: Full code  Family Communication:  Level of care: Med-Surg Dispo:   The patient is from: home Anticipated d/c is to: home Anticipated d/c date is: 2-3 days   Subjective and Interval History:  Pt reported starting to produce small amount of sputum.   Objective: Vitals:   09/04/24 0317 09/04/24 0820 09/04/24 1303 09/04/24 1629  BP: (!) 156/78 (!) 143/68 (!) 159/68 (!) 155/84  Pulse: 99 96 (!) 101 (!) 104  Resp: 18 17 17 18   Temp: 97.8 F (36.6 C) 97.6 F (36.4 C) 97.7 F (36.5 C) 98.5 F (36.9 C)  TempSrc: Oral     SpO2: 93% 100% 98% 100%  Weight:      Height:        Intake/Output Summary (Last 24 hours) at 09/04/2024 1952 Last data filed at 09/04/2024 1849 Gross per 24 hour  Intake 787.9 ml  Output 400 ml  Net 387.9 ml   Filed Weights   09/01/24 1017  Weight: 68 kg    Examination:   Constitutional: NAD,  AAOx3, tremors HEENT: conjunctivae and lids normal, EOMI CV: No cyanosis.   RESP: normal respiratory effort, on 3L Neuro: II - XII grossly intact.   Psych: Normal mood and affect.  Appropriate judgement and reason   Data Reviewed: I have personally reviewed labs and imaging studies  Time spent: 35 minutes  Ellouise Haber, MD [facility] If 7PM-7AM, please contact night-coverage 09/04/2024, 7:52 PM

## 2024-09-04 NOTE — Plan of Care (Signed)
°  Problem: Education: °Goal: Knowledge of disease or condition will improve °Outcome: Progressing °Goal: Knowledge of the prescribed therapeutic regimen will improve °Outcome: Progressing °Goal: Individualized Educational Video(s) °Outcome: Progressing °  °

## 2024-09-04 NOTE — Progress Notes (Signed)
 Physical Therapy Treatment Patient Details Name: Heidi [name] MRN: [id number] DOB: 10-02-1956 Today's Date: 09/04/2024   History of Present Illness Heidi [name] is a 68 y.o. female with medical history significant of COPD Gold stage IV on chronic antibiotics and steroid therapy, chronic hypoxic respiratory failure on 4 L and as needed BiPAP, chronic HFpEF presented with worsening of cough wheezing shortness of breath. Pt is on 3L of O2 at baseline.    PT Comments  Pt is making good progress towards goals with ability to ambulate in hallway with initially no AD, however then required use of B hands on IV pole. Pt educated on use of walker at all times. O2 sats monitored throughout. Will continue to progress as able.    If plan is discharge home, recommend the following: A little help with walking and/or transfers;A little help with bathing/dressing/bathroom;Help with stairs or ramp for entrance   Can travel by private vehicle        Equipment Recommendations  None recommended by PT    Recommendations for Other Services       Precautions / Restrictions Precautions Precautions: Fall Recall of Precautions/Restrictions: Intact Restrictions Weight Bearing Restrictions Per Provider Order: No     Mobility  Bed Mobility Overal bed mobility: Needs Assistance Bed Mobility: Supine to Sit     Supine to sit: Supervision     General bed mobility comments: safe technique    Transfers Overall transfer level: Needs assistance Equipment used: Rolling walker (2 wheels) Transfers: Sit to/from Stand Sit to Stand: Supervision           General transfer comment: transfers with RW. Upright posture    Ambulation/Gait Ambulation/Gait assistance: Contact guard assist Gait Distance (Feet): 50 Feet Assistive device: None Gait Pattern/deviations: Narrow base of support, Trunk flexed, Decreased stride length       General Gait Details: ambulated with short shuffle gait pattern.  Initially without AD, however then pt requested to reach for railing and ultimately held onto IV pole. ALl mobility performed on 3L of O2 with sats at 93% and HR at 115bpm.   Stairs             Wheelchair Mobility     Tilt Bed    Modified Rankin (Stroke Patients Only)       Balance Overall balance assessment: Needs assistance Sitting-balance support: No upper extremity supported, Feet supported Sitting balance-Leahy Scale: Good     Standing balance support: During functional activity, Bilateral upper extremity supported Standing balance-Leahy Scale: Fair                              Hotel Manager: No apparent difficulties  Cognition Arousal: Alert Behavior During Therapy: WFL for tasks assessed/performed   PT - Cognitive impairments: No apparent impairments                       PT - Cognition Comments: Pt is A&Ox4. Pleasant and agreeable to PT Following commands: Intact      Cueing Cueing Techniques: Verbal cues  Exercises      General Comments        Pertinent Vitals/Pain Pain Assessment Pain Assessment: No/denies pain    Home Living                          Prior Function  PT Goals (current goals can now be found in the care plan section) Acute Rehab PT Goals Patient Stated Goal: to breathe better PT Goal Formulation: With patient Time For Goal Achievement: 09/16/24 Potential to Achieve Goals: Good Progress towards PT goals: Progressing toward goals    Frequency    Min 2X/week      PT Plan      Co-evaluation              AM-PAC PT 6 Clicks Mobility   Outcome Measure  Help needed turning from your back to your side while in a flat bed without using bedrails?: None Help needed moving from lying on your back to sitting on the side of a flat bed without using bedrails?: A Little Help needed moving to and from a bed to a chair (including a wheelchair)?: A  Little Help needed standing up from a chair using your arms (e.g., wheelchair or bedside chair)?: A Little Help needed to walk in hospital room?: A Little Help needed climbing 3-5 steps with a railing? : A Little 6 Click Score: 19    End of Session Equipment Utilized During Treatment: Gait belt;Oxygen Activity Tolerance: Patient tolerated treatment well Patient left: in chair;with call bell/phone within reach Nurse Communication: Mobility status PT Visit Diagnosis: Other abnormalities of gait and mobility (R26.89);Difficulty in walking, not elsewhere classified (R26.2)     Time: 1017-1030 PT Time Calculation (min) (ACUTE ONLY): 13 min  Charges:    $Gait Training: 8-22 mins PT General Charges $$ ACUTE PT VISIT: 1 Visit                     Corean Dade, PT, DPT, [facility] [phone]    Shalaine Payson 09/04/2024, 1:54 PM

## 2024-09-05 DIAGNOSIS — J441 Chronic obstructive pulmonary disease with (acute) exacerbation: Secondary | ICD-10-CM | POA: Diagnosis not present

## 2024-09-05 MED ORDER — AMOXICILLIN-POT CLAVULANATE 875-125 MG PO TABS
1.0000 | ORAL_TABLET | Freq: Two times a day (BID) | ORAL | Status: AC
  Administered 2024-09-05 – 2024-09-06 (×4): 1 via ORAL
  Filled 2024-09-05 (×4): qty 1

## 2024-09-05 MED ORDER — IPRATROPIUM-ALBUTEROL 0.5-2.5 (3) MG/3ML IN SOLN
3.0000 mL | Freq: Three times a day (TID) | RESPIRATORY_TRACT | Status: DC
  Administered 2024-09-05 – 2024-09-07 (×6): 3 mL via RESPIRATORY_TRACT
  Filled 2024-09-05 (×6): qty 3

## 2024-09-05 MED ORDER — ORAL CARE MOUTH RINSE: 15.0000 mL | OROMUCOSAL | Status: DC | PRN

## 2024-09-05 NOTE — Progress Notes (Signed)
 Occupational Therapy Treatment Patient Details Name: Heidi [name] MRN: [id number] DOB: 1956-10-05 Today's Date: 09/05/2024   History of present illness Heidi [name] is a 68 y.o. female with medical history significant of COPD Gold stage IV on chronic antibiotics and steroid therapy, chronic hypoxic respiratory failure on 4 L and as needed BiPAP, chronic HFpEF presented with worsening of cough, wheezing, shortness of breath. Pt is on 3L of O2 at baseline.   OT comments  Pt is supine in bed on arrival. Pleasant and agreeable to OT session. She denies pain. Found on 3L 02 via [place] with desat to 87% on 3L with toileting tasks. Pt performed bed mobility with Mod I, increased time/effort. Rest breaks between all activities to promote cardiopulmonary status. Supervision for all toileting tasks, clothing management and ambulation ~25 ft within the room without AD with no LOB. Increased standing time to look out the window with good tolerance. Pt on 4L for ambulation with desat to 91% and quick improvement to 97% once returned to 3L and resting in bed at pt request for breakfast arrival. She was left with all needs in place and will cont to require skilled acute OT services to maximize her safety and IND to return to PLOF.       If plan is discharge home, recommend the following:  A little help with walking and/or transfers;A little help with bathing/dressing/bathroom;Help with stairs or ramp for entrance   Equipment Recommendations  None recommended by OT    Recommendations for Other Services      Precautions / Restrictions Precautions Precautions: Fall Recall of Precautions/Restrictions: Intact Restrictions Weight Bearing Restrictions Per Provider Order: No       Mobility Bed Mobility Overal bed mobility: Modified Independent                  Transfers Overall transfer level: Modified independent   Transfers: Sit to/from Stand, Bed to chair/wheelchair/BSC Sit to Stand:  Supervision Stand pivot transfers: Supervision         General transfer comment: supervision for all transfers and ambulation ~25 ft within the room with extended standing while looking out the window prior to return to bed for breakfast     Balance Overall balance assessment: Needs assistance Sitting-balance support: No upper extremity supported, Feet supported Sitting balance-Leahy Scale: Normal     Standing balance support: During functional activity, No upper extremity supported Standing balance-Leahy Scale: Fair Standing balance comment: no LOB during ambulation without AD short distances                           ADL either performed or assessed with clinical judgement   ADL Overall ADL's : Needs assistance/impaired             Lower Body Bathing: Supervison/ safety;Modified independent Lower Body Bathing Details (indicate cue type and reason): donn slip on shoes         Toilet Transfer: Grab bars;BSC/3in1;Stand-pivot Toilet Transfer Details (indicate cue type and reason): urgent need for urination Toileting- Clothing Manipulation and Hygiene: Supervision/safety;Sit to/from stand Toileting - Clothing Manipulation Details (indicate cue type and reason): manage mesh underwear and seated peri-care     Functional mobility during ADLs: Supervision/safety      Extremity/Trunk Assessment              Vision       Perception     Praxis     Communication Communication Communication: No apparent  difficulties   Cognition Arousal: Alert Behavior During Therapy: WFL for tasks assessed/performed                                 Following commands: Intact        Cueing   Cueing Techniques: Verbal cues  Exercises Other Exercises Other Exercises: Re-edu on ECS, pacing, task modification, etc. to minimize overexertion and maximize safety/IND.    Shoulder Instructions       General Comments dropped to 87% on 3L with toileting  tasks, placed on 4L during ambulation with desat to 91% with improvement to 97% once returned to 3L and resting in bed    Pertinent Vitals/ Pain       Pain Assessment Pain Assessment: No/denies pain  Home Living                                          Prior Functioning/Environment              Frequency  Min 2X/week        Progress Toward Goals  OT Goals(current goals can now be found in the care plan section)  Progress towards OT goals: Progressing toward goals  Acute Rehab OT Goals Patient Stated Goal: go home OT Goal Formulation: With patient Time For Goal Achievement: 09/17/24 Potential to Achieve Goals: Good  Plan      Co-evaluation                 AM-PAC OT 6 Clicks Daily Activity     Outcome Measure   Help from another person eating meals?: None Help from another person taking care of personal grooming?: None Help from another person toileting, which includes using toliet, bedpan, or urinal?: A Little Help from another person bathing (including washing, rinsing, drying)?: A Little Help from another person to put on and taking off regular upper body clothing?: A Little Help from another person to put on and taking off regular lower body clothing?: A Little 6 Click Score: 20    End of Session Equipment Utilized During Treatment: Oxygen  OT Visit Diagnosis: Unsteadiness on feet (R26.81);Other abnormalities of gait and mobility (R26.89)   Activity Tolerance Patient tolerated treatment well   Patient Left in bed;with call bell/phone within reach;with bed alarm set   Nurse Communication          Time: [id number] OT Time Calculation (min): 16 min  Charges: OT General Charges $OT Visit: 1 Visit OT Treatments $Self Care/Home Management : 8-22 mins  Duwaine Saupe, OTR/L  09/05/24, 9:38 AM   Duwaine FORBES Saupe 09/05/2024, 9:36 AM

## 2024-09-05 NOTE — Progress Notes (Signed)
  PROGRESS NOTE    Heidi [name]  FMW:[id number] DOB: 1956/03/23 DOA: 09/01/2024 PCP: Heidi Eva POUR, PA  [id number]  LOS: 4 days   Brief hospital course:   Assessment & Plan: Heidi [name] is a 68 y.o. female with medical history significant of COPD Gold stage IV on chronic antibiotics and steroid therapy, chronic hypoxic respiratory failure on 4 L and as needed BiPAP, chronic HFpEF presented with worsening of cough wheezing shortness of breath.    Acute on chronic hypoxic respiratory failure:  On 4L O2 at baseline found to be 87% on 3L [place] as per EMS.  Now on 3L. --Continue supplemental O2 to keep sats >=90%  Acute COPD exacerbation 2/2 Rhinoviral infection --on chronic prednisone  5 mg daily PTA --s/p IV solumedrol, transitioned to prednisone  --cont prednisone  40 mg daily --cont bronchodilators --cont empiric tx for bacterial PNA  --cont hypertonic saline neb BID   Depression: severity unknown.  Continue on home dose of lexapro , buspar    Anxiety --cont home Xanax    Deconditioning:  --PT/OT   DVT prophylaxis: Lovenox  SQ Code Status: Full code  Family Communication:  Level of care: Med-Surg Dispo:   The patient is from: home Anticipated d/c is to: home Anticipated d/c date is: sunday   Subjective and Interval History:  Pt reported feeling better.  Still can't produce sputum.  Good oral intake.   Objective: Vitals:   09/05/24 0416 09/05/24 0744 09/05/24 0757 09/05/24 1404  BP: 138/73  (!) 170/95   Pulse: 79     Resp: 18  20   Temp: 97.8 F (36.6 C)  97.8 F (36.6 C)   TempSrc: Oral     SpO2: 91% 94% 99% 98%  Weight:      Height:        Intake/Output Summary (Last 24 hours) at 09/05/2024 1943 Last data filed at 09/05/2024 1420 Gross per 24 hour  Intake 960 ml  Output --  Net 960 ml   Filed Weights   09/01/24 1017  Weight: 68 kg    Examination:   Constitutional: NAD, AAOx3 HEENT: conjunctivae and lids normal, EOMI CV: No  cyanosis.   RESP: normal respiratory effort, with frequent coughs Neuro: II - XII grossly intact.   Psych: Normal mood and affect.  Appropriate judgement and reason   Data Reviewed: I have personally reviewed labs and imaging studies  Time spent: 35 minutes  Heidi Haber, MD [facility] If 7PM-7AM, please contact night-coverage 09/05/2024, 7:43 PM

## 2024-09-05 NOTE — Progress Notes (Signed)
 Mobility Specialist - Progress Note  Pre-mobility: HR-101, SpO2-94%  During mobility: HR-114,SpO2-94%  Post-mobility: HR-110, SPO2-98%   09/05/24 1452  Mobility  Activity Ambulated with assistance;Stood at bedside  Level of Assistance Standby assist, set-up cues, supervision of patient - no hands on  Assistive Device None (O2 Tank)  Distance Ambulated (ft) 30 ft  Range of Motion/Exercises All extremities  Activity Response Tolerated well  Mobility visit 1 Mobility  Mobility Specialist Start Time (ACUTE ONLY) 1407  Mobility Specialist Stop Time (ACUTE ONLY) 1435  Mobility Specialist Time Calculation (min) (ACUTE ONLY) 28 min   Pt was supine in bed with HOB elevated having NEB treatment upon entry. Pt agreed to mobility as session was finishing. Pt is on O2 @ 3L. 4L with activity. Pt is able today to get to the EOB with bed features independently. Pt is able to STS independently with no AD. Pt O2 vitals were taken throughout activity as a precaution. Pt ambulated well. Recovery break was needed to check vitals. After activity pt is in bed with needs in reach.  Clem Rodes Mobility Specialist 09/05/24, 3:14 PM

## 2024-09-05 NOTE — Plan of Care (Signed)
   Problem: Education: Goal: Knowledge of disease or condition will improve Outcome: Progressing Goal: Knowledge of the prescribed therapeutic regimen will improve Outcome: Progressing Goal: Individualized Educational Video(s) Outcome: Progressing   Problem: Activity: Goal: Ability to tolerate increased activity will improve Outcome: Progressing Goal: Will verbalize the importance of balancing activity with adequate rest periods Outcome: Progressing   Problem: Respiratory: Goal: Ability to maintain a clear airway will improve Outcome: Progressing Goal: Levels of oxygenation will improve Outcome: Progressing Goal: Ability to maintain adequate ventilation will improve Outcome: Progressing   Problem: Education: Goal: Knowledge of General Education information will improve Description: Including pain rating scale, medication(s)/side effects and non-pharmacologic comfort measures Outcome: Progressing   Problem: Health Behavior/Discharge Planning: Goal: Ability to manage health-related needs will improve Outcome: Progressing   Problem: Clinical Measurements: Goal: Ability to maintain clinical measurements within normal limits will improve Outcome: Progressing Goal: Will remain free from infection Outcome: Progressing Goal: Diagnostic test results will improve Outcome: Progressing Goal: Respiratory complications will improve Outcome: Progressing Goal: Cardiovascular complication will be avoided Outcome: Progressing   Problem: Activity: Goal: Risk for activity intolerance will decrease Outcome: Progressing   Problem: Nutrition: Goal: Adequate nutrition will be maintained Outcome: Progressing   Problem: Coping: Goal: Level of anxiety will decrease Outcome: Progressing   Problem: Elimination: Goal: Will not experience complications related to bowel motility Outcome: Progressing Goal: Will not experience complications related to urinary retention Outcome: Progressing    Problem: Pain Managment: Goal: General experience of comfort will improve and/or be controlled Outcome: Progressing   Problem: Safety: Goal: Ability to remain free from injury will improve Outcome: Progressing   Problem: Skin Integrity: Goal: Risk for impaired skin integrity will decrease Outcome: Progressing

## 2024-09-06 DIAGNOSIS — J441 Chronic obstructive pulmonary disease with (acute) exacerbation: Secondary | ICD-10-CM | POA: Diagnosis not present

## 2024-09-06 MED ORDER — POLYETHYLENE GLYCOL 3350 17 G PO PACK
34.0000 g | PACK | Freq: Two times a day (BID) | ORAL | Status: DC
Start: 2024-09-06 — End: 2024-09-09
  Administered 2024-09-06: 34 g via ORAL
  Filled 2024-09-06 (×3): qty 2

## 2024-09-06 MED ORDER — SODIUM CHLORIDE 3 % IN NEBU
4.0000 mL | INHALATION_SOLUTION | Freq: Two times a day (BID) | RESPIRATORY_TRACT | Status: DC
Start: 2024-09-06 — End: 2024-09-09
  Administered 2024-09-06 – 2024-09-07 (×2): 4 mL via RESPIRATORY_TRACT
  Filled 2024-09-06 (×3): qty 4

## 2024-09-06 NOTE — Progress Notes (Signed)
  PROGRESS NOTE    Heidi [name]  FMW:[id number] DOB: June 26, 1956 DOA: 09/01/2024 PCP: Marikay Eva POUR, PA  [id number]  LOS: 5 days   Brief hospital course:   Assessment & Plan: Heidi [name] is a 68 y.o. female with medical history significant of COPD Gold stage IV on chronic antibiotics and steroid therapy, chronic hypoxic respiratory failure on 4 L and as needed BiPAP, chronic HFpEF presented with worsening of cough wheezing shortness of breath.    Acute on chronic hypoxic respiratory failure:  On 4L O2 at baseline found to be 87% on 3L  as per EMS.   --Continue supplemental O2 to keep sats >=90%  Acute COPD exacerbation 2/2 Rhinoviral infection --on chronic prednisone  5 mg daily PTA --s/p IV solumedrol, transitioned to prednisone  --cont prednisone  40 mg daily --cont bronchodilators --cont empiric tx for bacterial PNA  --cont hypertonic saline neb BID   Depression: severity unknown.  Continue on home dose of lexapro , buspar    Anxiety --cont home Xanax    Deconditioning:  --PT/OT   DVT prophylaxis: Lovenox  SQ Code Status: Full code  Family Communication:  Level of care: Med-Surg Dispo:   The patient is from: home Anticipated d/c is to: home Anticipated d/c date is: sunday   Subjective and Interval History:  Pt reported feeling better.   Objective: Vitals:   09/06/24 0726 09/06/24 0813 09/06/24 1422 09/06/24 1719  BP:  136/60  (!) 159/68  Pulse:  89  (!) 101  Resp:  18  20  Temp:  97.9 F (36.6 C)  98.4 F (36.9 C)  TempSrc:    Oral  SpO2: 96% 100% 97% 95%  Weight:      Height:        Intake/Output Summary (Last 24 hours) at 09/06/2024 1933 Last data filed at 09/06/2024 1933 Gross per 24 hour  Intake 720 ml  Output --  Net 720 ml   Filed Weights   09/01/24 1017  Weight: 68 kg    Examination:   Constitutional: NAD, AAOx3 HEENT: conjunctivae and lids normal, EOMI CV: No cyanosis.   RESP: normal respiratory effort, on 2L  O2 Neuro: II - XII grossly intact.   Psych: Normal mood and affect.  Appropriate judgement and reason   Data Reviewed: I have personally reviewed labs and imaging studies  Time spent: 35 minutes  Ellouise Haber, MD [facility] If 7PM-7AM, please contact night-coverage 09/06/2024, 7:33 PM

## 2024-09-06 NOTE — Plan of Care (Signed)
  Problem: Education: Goal: Knowledge of disease or condition will improve Outcome: Progressing Goal: Knowledge of the prescribed therapeutic regimen will improve Outcome: Progressing   Problem: Activity: Goal: Ability to tolerate increased activity will improve Outcome: Progressing Goal: Will verbalize the importance of balancing activity with adequate rest periods Outcome: Progressing   Problem: Respiratory: Goal: Ability to maintain a clear airway will improve Outcome: Progressing Goal: Levels of oxygenation will improve Outcome: Progressing Goal: Ability to maintain adequate ventilation will improve Outcome: Progressing   Problem: Education: Goal: Knowledge of General Education information will improve Description: Including pain rating scale, medication(s)/side effects and non-pharmacologic comfort measures Outcome: Progressing   Problem: Health Behavior/Discharge Planning: Goal: Ability to manage health-related needs will improve Outcome: Progressing   Problem: Clinical Measurements: Goal: Ability to maintain clinical measurements within normal limits will improve Outcome: Progressing Goal: Will remain free from infection Outcome: Progressing Goal: Diagnostic test results will improve Outcome: Progressing Goal: Respiratory complications will improve Outcome: Progressing Goal: Cardiovascular complication will be avoided Outcome: Progressing   Problem: Activity: Goal: Risk for activity intolerance will decrease Outcome: Progressing   Problem: Nutrition: Goal: Adequate nutrition will be maintained Outcome: Progressing   Problem: Coping: Goal: Level of anxiety will decrease Outcome: Progressing   Problem: Elimination: Goal: Will not experience complications related to bowel motility Outcome: Progressing   Problem: Safety: Goal: Ability to remain free from injury will improve Outcome: Progressing   Problem: Skin Integrity: Goal: Risk for impaired skin  integrity will decrease Outcome: Progressing

## 2024-09-06 NOTE — Plan of Care (Signed)
  Problem: Education: Goal: Individualized Educational Video(s) Outcome: Completed/Met   Problem: Elimination: Goal: Will not experience complications related to urinary retention Outcome: Completed/Met   Problem: Pain Managment: Goal: General experience of comfort will improve and/or be controlled Outcome: Completed/Met

## 2024-09-07 DIAGNOSIS — J441 Chronic obstructive pulmonary disease with (acute) exacerbation: Secondary | ICD-10-CM | POA: Diagnosis not present

## 2024-09-07 MED ORDER — PREDNISONE 5 MG PO TABS: 5.0000 mg | ORAL_TABLET | Freq: Every day | ORAL | Status: AC

## 2024-09-07 MED ORDER — EPINEPHRINE 0.3 MG/0.3ML IJ SOAJ: 0.3000 mg | INTRAMUSCULAR | Status: AC | PRN

## 2024-09-07 MED ORDER — PREDNISONE 5 MG PO TABS: ORAL_TABLET | ORAL | 0 refills | Status: AC

## 2024-09-07 MED ORDER — SODIUM CHLORIDE 3 % IN NEBU: 4.0000 mL | INHALATION_SOLUTION | Freq: Two times a day (BID) | RESPIRATORY_TRACT | 0 refills | Status: AC | PRN

## 2024-09-07 MED ORDER — FLUTICASONE PROPIONATE 50 MCG/ACT NA SUSP: 2.0000 | Freq: Every day | NASAL | Status: AC | PRN

## 2024-09-07 NOTE — Plan of Care (Signed)
 Patient is pleasant. Alert and oriented x4. Diminished/rhonchi lung sounds, on 3L [place] which is baseline strong nonproductive cough. Abdomen soft, last bowel movement 11/8, +bowel sounds. Voiding without any difficulty. +CMS, able to feel sensation, +dorsi/plantar flex, denies numbness or tingling. Tolerating diet. Ambulating standby assistance out of bed. Droplet precautions maintained. Hourly rounding performed, fall precautions maintained, and call bell within reach.   Problem: Education: Goal: Knowledge of disease or condition will improve Outcome: Progressing Goal: Knowledge of the prescribed therapeutic regimen will improve Outcome: Progressing   Problem: Activity: Goal: Ability to tolerate increased activity will improve Outcome: Progressing Goal: Will verbalize the importance of balancing activity with adequate rest periods Outcome: Progressing   Problem: Respiratory: Goal: Ability to maintain a clear airway will improve Outcome: Progressing Goal: Levels of oxygenation will improve Outcome: Progressing Goal: Ability to maintain adequate ventilation will improve Outcome: Progressing   Problem: Education: Goal: Knowledge of General Education information will improve Description: Including pain rating scale, medication(s)/side effects and non-pharmacologic comfort measures Outcome: Progressing   Problem: Health Behavior/Discharge Planning: Goal: Ability to manage health-related needs will improve Outcome: Progressing   Problem: Clinical Measurements: Goal: Ability to maintain clinical measurements within normal limits will improve Outcome: Progressing Goal: Will remain free from infection Outcome: Progressing Goal: Diagnostic test results will improve Outcome: Progressing Goal: Respiratory complications will improve Outcome: Progressing Goal: Cardiovascular complication will be avoided Outcome: Progressing   Problem: Activity: Goal: Risk for activity intolerance will  decrease Outcome: Progressing   Problem: Nutrition: Goal: Adequate nutrition will be maintained Outcome: Progressing   Problem: Coping: Goal: Level of anxiety will decrease Outcome: Progressing   Problem: Elimination: Goal: Will not experience complications related to bowel motility Outcome: Progressing   Problem: Safety: Goal: Ability to remain free from injury will improve Outcome: Progressing   Problem: Skin Integrity: Goal: Risk for impaired skin integrity will decrease Outcome: Progressing

## 2024-09-07 NOTE — TOC Transition Note (Incomplete)
 Transition of Care [facility]) - Discharge Note   Patient Details  Name: Heidi [name] MRN: [id number] Date of Birth: 11/25/1955  Transition of [facility]) CM/SW Contact:  Marinda Cooks, RN Phone Number: 09/07/2024, 12:21 PM   Clinical Narrative:    This CM updated by covering MD pt medically cleared to dc today and has active DC order . This CM spoke with .SABRA... Admission liaison @ .... . HH arranged with .SABRA... Spoke with .... @ HH agency    DC transportation confirmed for pt with ..... Medical team updated . No additional DC needs requested by medical team or identified by CM at this time .     Final next level of care: Home w Home Health Services Barriers to Discharge: No Barriers Identified   Patient Goals and CMS Choice     Choice offered to / list presented to : Patient      Discharge Placement                  Name of family member notified: Patient Patient and family notified of of transfer: 09/07/24  Discharge Plan and Services Additional resources added to the After Visit Summary for                            [facility] Arranged: PT, OT [facility] Agency: [facility] Date [facility] Agency Contacted: 09/07/24 Time HH Agency Contacted: 1218 Representative spoke with at [facility] Agency: [place]  Social Drivers of Health (SDOH) Interventions SDOH Screenings   Food Insecurity: No Food Insecurity (01/10/2024)   Received from [facility]  Housing: Low Risk  (01/10/2024)   Received from [facility]  Transportation Needs: No Transportation Needs (01/10/2024)   Received from [facility]  Utilities: Not At Risk (01/10/2024)   Received from [facility]  Depression [phone]): Low Risk  (04/11/2023)  Financial Resource Strain: Low Risk  (01/10/2024)   Received from [facility]  Physical Activity: Inactive (10/16/2018)  Social Connections: Moderately Integrated (11/23/2023)  Stress:  Stress Concern Present (10/16/2018)  Tobacco Use: Medium Risk (09/01/2024)     Readmission Risk Interventions     No data to display

## 2024-09-07 NOTE — Discharge Summary (Signed)
 Physician Discharge Summary   Heidi [name]  female DOB: 1956/09/09  FMW:[id number]  PCP: Marikay Eva POUR, PA  Admit date: 09/01/2024 Discharge date: 09/07/2024  Admitted From: home Disposition:  home Home Health: Yes CODE STATUS: Full code   Hospital Course:  For full details, please see H&P, progress notes, consult notes and ancillary notes.  Briefly,  Heidi [name] is a 68 y.o. female with medical history significant of COPD Gold stage IV on chronic antibiotics and steroid therapy, chronic hypoxic respiratory failure on 4 L and as needed BiPAP, chronic HFpEF presented with worsening of cough wheezing shortness of breath.    Acute on chronic hypoxic respiratory failure:  On 4L O2 at baseline found to be 87% on 3L [place] as per EMS.   --pt was sating well on 3L O2 prior to discharge.   Acute COPD exacerbation 2/2 Rhinoviral infection --on chronic prednisone  5 mg daily and Bactrim  PTA. --s/p IV solumedrol, transitioned to prednisone , and discharged on prednisone  taper down to pt's chronic 5 mg daily. --received empiric abx with 3 days of ceftriaxone  f/b 2 days of augmentin . --received hypertonic saline and DuoNeb nebs --cont home bronchodilators    Depression: severity unknown.  Continue on home dose of lexapro , buspar  and Remeron .   Anxiety --cont home Xanax    Deconditioning:  --PT/OT   Unless noted above, medications under STOP list are ones pt was not taking PTA.  Discharge Diagnoses:  Principal Problem:   COPD exacerbation (HCC) Active Problems:   COPD with acute exacerbation (HCC)   30 Day Unplanned Readmission Risk Score    Flowsheet Row ED to Hosp-Admission (Current) from 09/01/2024 in [facility] (1A)  30 Day Unplanned Readmission Risk Score (%) 12.11 Filed at 09/07/2024 0801    This score is the patient's risk of an unplanned readmission within 30 days of being discharged (0 -100%). The score is based on  dignosis, age, lab data, medications, orders, and past utilization.   Low:  0-14.9   Medium: 15-21.9   High: 22-29.9   Extreme: 30 and above         Discharge Instructions:  Allergies as of 09/07/2024       Reactions   Prozac  [fluoxetine ] Itching   Budesonide     Other reaction(s): Dizziness (per pt: still takes this medication)   Levaquin  [levofloxacin  In D5w] Hives   Propranolol  Other (See Comments)   dizziness   Zoloft  [sertraline  Hcl] Other (See Comments)   Increased anxiety        Medication List     STOP taking these medications    Dupixent 300 MG/2ML Soaj Generic drug: Dupilumab   mometasone  0.1 % ointment Commonly known as: ELOCON    mupirocin  ointment 2 % Commonly known as: BACTROBAN    neomycin-polymyxin b-dexamethasone  3.5-10000-0.1 Susp Commonly known as: MAXITROL   Ohtuvayre  3 MG/2.5ML Susp Generic drug: Ensifentrine    pimecrolimus 1 % cream Commonly known as: Elidel       TAKE these medications    albuterol  108 (90 Base) MCG/ACT inhaler Commonly known as: VENTOLIN  HFA Inhale 1-2 puffs into the lungs every 4 (four) hours as needed for wheezing or shortness of breath.   albuterol  (2.5 MG/3ML) 0.083% nebulizer solution Commonly known as: PROVENTIL  Take 3 mLs (2.5 mg total) by nebulization every 6 (six) hours as needed for wheezing or shortness of breath.   ALPRAZolam  0.5 MG tablet Commonly known as: XANAX  Take 0.5 mg by mouth 2 (two) times daily.   busPIRone   10 MG tablet Commonly known as: BUSPAR  Take 1 tablet by mouth 3 (three) times daily.   Combivent  Respimat 20-100 MCG/ACT Aers respimat Generic drug: Ipratropium-Albuterol  INHALE 1 PUFF BY MOUTH EVERY 6 HOURS AS NEEDED FOR WHEEZING   ipratropium-albuterol  0.5-2.5 (3) MG/3ML Soln Commonly known as: DUONEB Take 3 mLs by nebulization every 4 (four) hours as needed.   EPINEPHrine  0.3 mg/0.3 mL Soaj injection Commonly known as: EPI-PEN Inject 0.3 mg into the muscle as needed for  anaphylaxis. Home med. What changed:  how much to take when to take this reasons to take this additional instructions   escitalopram  20 MG tablet Commonly known as: LEXAPRO  Take 1 tablet by mouth daily.   fluticasone  50 MCG/ACT nasal spray Commonly known as: FLONASE  Place 2 sprays into both nostrils daily as needed for allergies or rhinitis. Home med. What changed:  when to take this reasons to take this additional instructions   guaiFENesin  100 MG/5ML liquid Commonly known as: ROBITUSSIN Take 5 mLs by mouth every 4 (four) hours as needed for cough or to loosen phlegm.   ketoconazole 2 % cream Commonly known as: NIZORAL Apply to aa rash on the eyelid QD x 2 mths.   mirtazapine  30 MG tablet Commonly known as: REMERON  Take 15 mg by mouth at bedtime.   montelukast  10 MG tablet Commonly known as: SINGULAIR  Take 10 mg by mouth at bedtime.   predniSONE  5 MG tablet Commonly known as: DELTASONE  Take 7 pills (35 mg) on 11/10, then 6 pills (30 mg) on 11/9, and reduce by 1 pill every day until down to 5 mg (your chronic home dose), then continue at 5 mg daily. What changed: You were already taking a medication with the same name, and this prescription was added. Make sure you understand how and when to take each.   predniSONE  5 MG tablet Commonly known as: DELTASONE  Take 1 tablet (5 mg total) by mouth daily. Home med. Start taking on: September 14, 2024 What changed:  additional instructions These instructions start on September 14, 2024. If you are unsure what to do until then, ask your doctor or other care provider.   sodium chloride  HYPERTONIC 3 % nebulizer solution Take 4 mLs by nebulization 2 (two) times daily as needed for up to 5 days for other. Need to take it with DuoNeb.   sulfamethoxazole -trimethoprim  400-80 MG tablet Commonly known as: BACTRIM  Take 1 tablet by mouth 3 (three) times a week. Monday, Wednesday and friday   Trelegy Ellipta  100-62.5-25 MCG/INH  Aepb Generic drug: Fluticasone -Umeclidin-Vilant INHALE 1 PUFF ONCE DAILY -  NEED  APPT  FOR  FURTHER  REFILLS What changed: Another medication with the same name was removed. Continue taking this medication, and follow the directions you see here.         Contact information for follow-up providers     Marikay Spear K, PA Follow up in 1 week(s).   Specialty: Physician Assistant Contact information: [address] [facility] [place] [address] [phone]              Contact information for after-discharge care     Home Medical Care     [facility]) .   Service: Home Health Services                     Allergies  Allergen Reactions   Prozac  [Fluoxetine ] Itching   Budesonide      Other reaction(s): Dizziness (per pt:  still takes this medication)   Levaquin  [Levofloxacin  In D5w] Hives   Propranolol  Other (See Comments)    dizziness   Zoloft  [Sertraline  Hcl] Other (See Comments)    Increased anxiety     The results of significant diagnostics from this hospitalization (including imaging, microbiology, ancillary and laboratory) are listed below for reference.   Consultations:   Procedures/Studies: ECHOCARDIOGRAM COMPLETE Result Date: 09/03/2024    ECHOCARDIOGRAM REPORT   Patient Name:   Heidi [name] Date of Exam: 09/03/2024 Medical Rec #:  [id number]        Height:       65.0 in Accession #:    [id number]       Weight:       150.0 lb Date of Birth:  20-Oct-1956        BSA:          1.750 m Patient Age:    68 years         BP:           137/89 mmHg Patient Gender: F                HR:           92 bpm. Exam Location:  [facility] Procedure: 2D Echo, Cardiac Doppler and Color Doppler (Both Spectral and Color            Flow Doppler were utilized during procedure). Indications:     CHF-acute diastolic I50.31  History:         Patient has prior history of Echocardiogram examinations, most                  recent  07/10/2020. COPD. Hypotension,Takotsubo syndrome.  Sonographer:     Christopher Furnace Referring Phys:  [id number] CORT ONEIDA MANA Diagnosing Phys: Cara JONETTA Lovelace MD  Sonographer Comments: Technically challenging study due to limited acoustic windows, no apical window and no subcostal window. Image acquisition challenging due to COPD. IMPRESSIONS  1. Left ventricular ejection fraction, by estimation, is 55 to 60%. The left ventricle has normal function. The left ventricle has no regional wall motion abnormalities. Left ventricular diastolic function could not be evaluated.  2. Right ventricular systolic function is normal. The right ventricular size is normal.  3. The mitral valve is normal in structure. Trivial mitral valve regurgitation.  4. The aortic valve is normal in structure. Aortic valve regurgitation is not visualized. FINDINGS  Left Ventricle: Left ventricular ejection fraction, by estimation, is 55 to 60%. The left ventricle has normal function. The left ventricle has no regional wall motion abnormalities. Strain was performed and the global longitudinal strain is indeterminate. The left ventricular internal cavity size was normal in size. There is borderline left ventricular hypertrophy. Left ventricular diastolic function could not be evaluated. Right Ventricle: The right ventricular size is normal. No increase in right ventricular wall thickness. Right ventricular systolic function is normal. Left Atrium: Left atrial size was normal in size. Right Atrium: Right atrial size was normal in size. Pericardium: There is no evidence of pericardial effusion. Mitral Valve: The mitral valve is normal in structure. Trivial mitral valve regurgitation. Tricuspid Valve: The tricuspid valve is normal in structure. Tricuspid valve regurgitation is trivial. Aortic Valve: The aortic valve is normal in structure. Aortic valve regurgitation is not visualized. Pulmonic Valve: The pulmonic valve was normal in structure. Pulmonic valve  regurgitation is not visualized. Aorta: The ascending aorta was not well visualized. IAS/Shunts: No atrial level shunt detected by color  flow Doppler. Additional Comments: 3D was performed not requiring image post processing on an independent workstation and was indeterminate.  LEFT VENTRICLE PLAX 2D LVIDd:         4.10 cm LVIDs:         2.90 cm LV PW:         1.00 cm LV IVS:        1.20 cm LVOT diam:     2.00 cm LVOT Area:     3.14 cm  LEFT ATRIUM         Index LA diam:    2.73 cm 1.56 cm/m   AORTA Ao Root diam: 2.40 cm  SHUNTS Systemic Diam: 2.00 cm Cara JONETTA Lovelace MD Electronically signed by Cara JONETTA Lovelace MD Signature Date/Time: 09/03/2024/5:09:04 PM    Final    DG Chest Portable 1 View Result Date: 09/01/2024 EXAM: 1 VIEW(S) XRAY OF THE CHEST 09/01/2024 10:47:16 AM COMPARISON: 11/21/2023 CLINICAL HISTORY: sob, wheezing FINDINGS: LUNGS AND PLEURA: Emphysema. Biapical scarring medially. Accentuated interstitial markings at the left lung base are nonspecific but early bronchopneumonia is not excluded. No pulmonary edema. No pleural effusion. No pneumothorax. HEART AND MEDIASTINUM: Atheromatous vascular calcification of the aortic arch. No acute abnormality of the cardiac silhouette. BONES AND SOFT TISSUES: No acute osseous abnormality. IMPRESSION: 1. Accentuated interstitial markings at the left lung base, nonspecific, with early bronchopneumonia not excluded. 2. Emphysema and biapical scarring medially. 3. Atheromatous vascular calcification of the aortic arch. Electronically signed by: Ryan Salvage MD 09/01/2024 12:10 PM EST RP Workstation: [id number]      Labs: BNP (last 3 results) Recent Labs    11/21/23 1001  BNP 23.2   Basic Metabolic Panel: Recent Labs  Lab 09/01/24 1023 09/02/24 0327 09/03/24 0514  NA 140 141 142  K 3.8 4.6 4.6  CL 94* 97* 97*  CO2 34* 34* 34*  GLUCOSE 122* 172* 154*  BUN 7* 16 18  CREATININE 0.78 0.81 0.81  CALCIUM  9.1 9.0 9.3   Liver Function  Tests: Recent Labs  Lab 09/01/24 1023  AST 26  ALT 25  ALKPHOS 133*  BILITOT 0.6  PROT 8.0  ALBUMIN 3.9   No results for input(s): LIPASE, AMYLASE in the last 168 hours. No results for input(s): AMMONIA in the last 168 hours. CBC: Recent Labs  Lab 09/01/24 1023 09/03/24 0514  WBC 9.8 13.5*  NEUTROABS 6.3  --   HGB 12.5 11.9*  HCT 40.3 39.1  MCV 98.5 101.0*  PLT 296 263   Cardiac Enzymes: No results for input(s): CKTOTAL, CKMB, CKMBINDEX, TROPONINI in the last 168 hours. BNP: Invalid input(s): POCBNP CBG: No results for input(s): GLUCAP in the last 168 hours. D-Dimer No results for input(s): DDIMER in the last 72 hours. Hgb A1c No results for input(s): HGBA1C in the last 72 hours. Lipid Profile No results for input(s): CHOL, HDL, LDLCALC, TRIG, CHOLHDL, LDLDIRECT in the last 72 hours. Thyroid function studies No results for input(s): TSH, T4TOTAL, T3FREE, THYROIDAB in the last 72 hours.  Invalid input(s): FREET3 Anemia work up No results for input(s): VITAMINB12, FOLATE, FERRITIN, TIBC, IRON, RETICCTPCT in the last 72 hours. Urinalysis    Component Value Date/Time   COLORURINE STRAW (A) 12/03/2016 0101   APPEARANCEUR CLEAR (A) 12/03/2016 0101   APPEARANCEUR Clear 06/29/2013 2244   LABSPEC 1.006 12/03/2016 0101   LABSPEC 1.005 06/29/2013 2244   PHURINE 5.0 12/03/2016 0101   GLUCOSEU 150 (A) 12/03/2016 0101   GLUCOSEU Negative 06/29/2013 2244   HGBUR  SMALL (A) 12/03/2016 0101   HGBUR trace-lysed 07/12/2009 1033   BILIRUBINUR NEGATIVE 12/03/2016 0101   BILIRUBINUR Negative 06/29/2013 2244   KETONESUR NEGATIVE 12/03/2016 0101   PROTEINUR NEGATIVE 12/03/2016 0101   UROBILINOGEN 0.2 08/08/2012 0107   NITRITE NEGATIVE 12/03/2016 0101   LEUKOCYTESUR NEGATIVE 12/03/2016 0101   LEUKOCYTESUR Trace 06/29/2013 2244   Sepsis Labs Recent Labs  Lab 09/01/24 1023 09/03/24 0514  WBC 9.8 13.5*    Microbiology Recent Results (from the past 240 hours)  Respiratory (~20 pathogens) panel by PCR     Status: Abnormal   Collection Time: 09/01/24 10:22 AM   Specimen: Nasopharyngeal Swab; Respiratory  Result Value Ref Range Status   Adenovirus NOT DETECTED NOT DETECTED Final   Coronavirus 229E NOT DETECTED NOT DETECTED Final    Comment: (NOTE) The Coronavirus on the Respiratory Panel, DOES NOT test for the novel  Coronavirus (2019 nCoV)    Coronavirus HKU1 NOT DETECTED NOT DETECTED Final   Coronavirus NL63 NOT DETECTED NOT DETECTED Final   Coronavirus OC43 NOT DETECTED NOT DETECTED Final   Metapneumovirus NOT DETECTED NOT DETECTED Final   Rhinovirus / Enterovirus DETECTED (A) NOT DETECTED Final   Influenza A NOT DETECTED NOT DETECTED Final   Influenza B NOT DETECTED NOT DETECTED Final   Parainfluenza Virus 1 NOT DETECTED NOT DETECTED Final   Parainfluenza Virus 2 NOT DETECTED NOT DETECTED Final   Parainfluenza Virus 3 NOT DETECTED NOT DETECTED Final   Parainfluenza Virus 4 NOT DETECTED NOT DETECTED Final   Respiratory Syncytial Virus NOT DETECTED NOT DETECTED Final   Bordetella pertussis NOT DETECTED NOT DETECTED Final   Bordetella Parapertussis NOT DETECTED NOT DETECTED Final   Chlamydophila pneumoniae NOT DETECTED NOT DETECTED Final   Mycoplasma pneumoniae NOT DETECTED NOT DETECTED Final    Comment: Performed at [facility], [address] [place], [place] [address]  Resp panel by RT-PCR (RSV, Flu A&B, Covid) Anterior Nasal Swab     Status: None   Collection Time: 09/01/24 10:23 AM   Specimen: Anterior Nasal Swab  Result Value Ref Range Status   SARS Coronavirus 2 by RT PCR NEGATIVE NEGATIVE Final    Comment: (NOTE) SARS-CoV-2 target nucleic acids are NOT DETECTED.  The SARS-CoV-2 RNA is generally detectable in upper respiratory specimens during the acute phase of infection. The lowest concentration of SARS-CoV-2 viral copies this assay can detect is 138  copies/mL. A negative result does not preclude SARS-Cov-2 infection and should not be used as the sole basis for treatment or other patient management decisions. A negative result may occur with  improper specimen collection/handling, submission of specimen other than nasopharyngeal swab, presence of viral mutation(s) within the areas targeted by this assay, and inadequate number of viral copies(<138 copies/mL). A negative result must be combined with clinical observations, patient history, and epidemiological information. The expected result is Negative.  Fact Sheet for Patients:  bloggercourse.com  Fact Sheet for Healthcare Providers:  seriousbroker.it  This test is no t yet approved or cleared by the [place]  FDA and  has been authorized for detection and/or diagnosis of SARS-CoV-2 by FDA under an Emergency Use Authorization (EUA). This EUA will remain  in effect (meaning this test can be used) for the duration of the COVID-19 declaration under Section 564(b)(1) of the Act, 21 U.S.C.section 360bbb-3(b)(1), unless the authorization is terminated  or revoked sooner.       Influenza A by PCR NEGATIVE NEGATIVE Final   Influenza B by PCR NEGATIVE NEGATIVE  Final    Comment: (NOTE) The Xpert Xpress SARS-CoV-2/FLU/RSV plus assay is intended as an aid in the diagnosis of influenza from Nasopharyngeal swab specimens and should not be used as a sole basis for treatment. Nasal washings and aspirates are unacceptable for Xpert Xpress SARS-CoV-2/FLU/RSV testing.  Fact Sheet for Patients: bloggercourse.com  Fact Sheet for Healthcare Providers: seriousbroker.it  This test is not yet approved or cleared by the [place]  FDA and has been authorized for detection and/or diagnosis of SARS-CoV-2 by FDA under an Emergency Use Authorization (EUA). This EUA will remain in effect (meaning  this test can be used) for the duration of the COVID-19 declaration under Section 564(b)(1) of the Act, 21 U.S.C. section 360bbb-3(b)(1), unless the authorization is terminated or revoked.     Resp Syncytial Virus by PCR NEGATIVE NEGATIVE Final    Comment: (NOTE) Fact Sheet for Patients: bloggercourse.com  Fact Sheet for Healthcare Providers: seriousbroker.it  This test is not yet approved or cleared by the [place]  FDA and has been authorized for detection and/or diagnosis of SARS-CoV-2 by FDA under an Emergency Use Authorization (EUA). This EUA will remain in effect (meaning this test can be used) for the duration of the COVID-19 declaration under Section 564(b)(1) of the Act, 21 U.S.C. section 360bbb-3(b)(1), unless the authorization is terminated or revoked.  Performed at [facility], [address] [place], [place] [address]      Total time spend on discharging this patient, including the last patient exam, discussing the hospital stay, instructions for ongoing care as it relates to all pertinent caregivers, as well as preparing the medical discharge records, prescriptions, and/or referrals as applicable, is 35 minutes.    Ellouise Haber, MD  [facility] 09/07/2024, 8:17 AM

## 2024-11-11 LAB — COLOGUARD: COLOGUARD: NEGATIVE

## 2024-11-18 ENCOUNTER — Ambulatory Visit: Admitting: Dermatology
